# Patient Record
Sex: Male | Born: 1935 | Race: White | Hispanic: No | Marital: Married | Smoking: Former smoker
Health system: Southern US, Community
[De-identification: ages and names within clinical notes are randomized; demographics above are authoritative.]

## PROBLEM LIST (undated history)

## (undated) DIAGNOSIS — R079 Chest pain, unspecified: Secondary | ICD-10-CM

## (undated) DIAGNOSIS — G4733 Obstructive sleep apnea (adult) (pediatric): Secondary | ICD-10-CM

## (undated) DIAGNOSIS — R0602 Shortness of breath: Secondary | ICD-10-CM

## (undated) DIAGNOSIS — I739 Peripheral vascular disease, unspecified: Secondary | ICD-10-CM

## (undated) DIAGNOSIS — R0989 Other specified symptoms and signs involving the circulatory and respiratory systems: Secondary | ICD-10-CM

## (undated) DIAGNOSIS — D649 Anemia, unspecified: Secondary | ICD-10-CM

## (undated) DIAGNOSIS — I2510 Atherosclerotic heart disease of native coronary artery without angina pectoris: Secondary | ICD-10-CM

## (undated) DIAGNOSIS — K219 Gastro-esophageal reflux disease without esophagitis: Secondary | ICD-10-CM

## (undated) DIAGNOSIS — D696 Thrombocytopenia, unspecified: Secondary | ICD-10-CM

## (undated) DIAGNOSIS — Z87442 Personal history of urinary calculi: Secondary | ICD-10-CM

## (undated) DIAGNOSIS — I10 Essential (primary) hypertension: Secondary | ICD-10-CM

## (undated) DIAGNOSIS — I219 Acute myocardial infarction, unspecified: Secondary | ICD-10-CM

## (undated) DIAGNOSIS — E785 Hyperlipidemia, unspecified: Secondary | ICD-10-CM

## (undated) DIAGNOSIS — I350 Nonrheumatic aortic (valve) stenosis: Secondary | ICD-10-CM

## (undated) DIAGNOSIS — H9190 Unspecified hearing loss, unspecified ear: Secondary | ICD-10-CM

## (undated) DIAGNOSIS — F1010 Alcohol abuse, uncomplicated: Secondary | ICD-10-CM

## (undated) DIAGNOSIS — N200 Calculus of kidney: Secondary | ICD-10-CM

## (undated) HISTORY — PX: STOMACH SURGERY: SHX791

## (undated) HISTORY — DX: Anemia, unspecified: D64.9

## (undated) HISTORY — DX: Other specified symptoms and signs involving the circulatory and respiratory systems: R09.89

## (undated) HISTORY — DX: Atherosclerotic heart disease of native coronary artery without angina pectoris: I25.10

## (undated) HISTORY — DX: Nonrheumatic aortic (valve) stenosis: I35.0

## (undated) HISTORY — DX: Thrombocytopenia, unspecified: D69.6

## (undated) HISTORY — DX: Personal history of urinary calculi: Z87.442

## (undated) HISTORY — DX: Chest pain, unspecified: R07.9

## (undated) HISTORY — DX: Calculus of kidney: N20.0

## (undated) HISTORY — PX: CYSTO: SHX6284

## (undated) HISTORY — DX: Alcohol abuse, uncomplicated: F10.10

## (undated) HISTORY — DX: Peripheral vascular disease, unspecified: I73.9

## (undated) HISTORY — DX: Essential (primary) hypertension: I10

## (undated) HISTORY — DX: Hyperlipidemia, unspecified: E78.5

## (undated) HISTORY — DX: Obstructive sleep apnea (adult) (pediatric): G47.33

---

## 1989-12-13 HISTORY — PX: CORONARY ARTERY BYPASS GRAFT: SHX141

## 1994-12-13 DIAGNOSIS — I219 Acute myocardial infarction, unspecified: Secondary | ICD-10-CM

## 1994-12-13 HISTORY — DX: Acute myocardial infarction, unspecified: I21.9

## 1998-08-25 ENCOUNTER — Ambulatory Visit (HOSPITAL_COMMUNITY): Admission: RE | Admit: 1998-08-25 | Discharge: 1998-08-25 | Payer: Self-pay

## 2003-06-28 ENCOUNTER — Inpatient Hospital Stay (HOSPITAL_COMMUNITY): Admission: AD | Admit: 2003-06-28 | Discharge: 2003-07-09 | Payer: Self-pay | Admitting: Internal Medicine

## 2003-06-28 ENCOUNTER — Encounter: Payer: Self-pay | Admitting: Internal Medicine

## 2003-07-05 ENCOUNTER — Encounter: Payer: Self-pay | Admitting: Cardiothoracic Surgery

## 2003-07-06 ENCOUNTER — Encounter: Payer: Self-pay | Admitting: Cardiothoracic Surgery

## 2003-07-07 ENCOUNTER — Encounter: Payer: Self-pay | Admitting: Cardiothoracic Surgery

## 2003-07-08 ENCOUNTER — Encounter: Payer: Self-pay | Admitting: Cardiothoracic Surgery

## 2005-01-28 ENCOUNTER — Ambulatory Visit: Payer: Self-pay | Admitting: Cardiology

## 2005-05-12 ENCOUNTER — Ambulatory Visit: Payer: Self-pay | Admitting: Cardiology

## 2005-07-26 ENCOUNTER — Ambulatory Visit: Payer: Self-pay | Admitting: Cardiology

## 2005-11-08 ENCOUNTER — Ambulatory Visit: Payer: Self-pay | Admitting: Cardiology

## 2006-01-07 ENCOUNTER — Ambulatory Visit: Payer: Self-pay | Admitting: Cardiology

## 2006-10-12 ENCOUNTER — Ambulatory Visit: Payer: Self-pay | Admitting: Cardiology

## 2007-09-20 ENCOUNTER — Ambulatory Visit: Payer: Self-pay | Admitting: Internal Medicine

## 2007-10-03 ENCOUNTER — Encounter: Payer: Self-pay | Admitting: Internal Medicine

## 2007-10-03 ENCOUNTER — Ambulatory Visit: Payer: Self-pay | Admitting: Internal Medicine

## 2007-10-03 ENCOUNTER — Ambulatory Visit: Payer: Self-pay

## 2007-10-03 LAB — CONVERTED CEMR LAB
ALT: 21 units/L (ref 0–53)
AST: 20 units/L (ref 0–37)
Bilirubin, Direct: 0.2 mg/dL (ref 0.0–0.3)
CO2: 32 meq/L (ref 19–32)
Calcium: 9.4 mg/dL (ref 8.4–10.5)
Chloride: 101 meq/L (ref 96–112)
Cholesterol: 133 mg/dL (ref 0–200)
GFR calc non Af Amer: 79 mL/min
Glucose, Bld: 113 mg/dL — ABNORMAL HIGH (ref 70–99)
LDL Cholesterol: 71 mg/dL (ref 0–99)

## 2007-10-06 ENCOUNTER — Ambulatory Visit: Payer: Self-pay | Admitting: Pulmonary Disease

## 2007-10-16 ENCOUNTER — Ambulatory Visit: Payer: Self-pay | Admitting: Pulmonary Disease

## 2007-10-16 ENCOUNTER — Ambulatory Visit (HOSPITAL_BASED_OUTPATIENT_CLINIC_OR_DEPARTMENT_OTHER): Admission: RE | Admit: 2007-10-16 | Discharge: 2007-10-16 | Payer: Self-pay | Admitting: Pulmonary Disease

## 2007-11-02 ENCOUNTER — Ambulatory Visit: Payer: Self-pay | Admitting: Pulmonary Disease

## 2007-11-16 ENCOUNTER — Ambulatory Visit: Payer: Self-pay | Admitting: Pulmonary Disease

## 2007-11-16 ENCOUNTER — Ambulatory Visit (HOSPITAL_BASED_OUTPATIENT_CLINIC_OR_DEPARTMENT_OTHER): Admission: RE | Admit: 2007-11-16 | Discharge: 2007-11-16 | Payer: Self-pay | Admitting: Pulmonary Disease

## 2008-02-14 ENCOUNTER — Encounter: Payer: Self-pay | Admitting: Pulmonary Disease

## 2008-02-14 ENCOUNTER — Ambulatory Visit: Payer: Self-pay

## 2008-03-04 DIAGNOSIS — I2510 Atherosclerotic heart disease of native coronary artery without angina pectoris: Secondary | ICD-10-CM

## 2008-03-04 DIAGNOSIS — I10 Essential (primary) hypertension: Secondary | ICD-10-CM

## 2008-03-04 DIAGNOSIS — E785 Hyperlipidemia, unspecified: Secondary | ICD-10-CM | POA: Insufficient documentation

## 2008-03-05 ENCOUNTER — Ambulatory Visit: Payer: Self-pay | Admitting: Pulmonary Disease

## 2008-03-05 DIAGNOSIS — G4733 Obstructive sleep apnea (adult) (pediatric): Secondary | ICD-10-CM

## 2008-03-11 ENCOUNTER — Telehealth (INDEPENDENT_AMBULATORY_CARE_PROVIDER_SITE_OTHER): Payer: Self-pay | Admitting: *Deleted

## 2008-03-28 ENCOUNTER — Ambulatory Visit: Payer: Self-pay | Admitting: Internal Medicine

## 2008-07-09 ENCOUNTER — Encounter: Payer: Self-pay | Admitting: Pulmonary Disease

## 2008-07-18 ENCOUNTER — Ambulatory Visit: Payer: Self-pay

## 2008-07-23 ENCOUNTER — Ambulatory Visit: Payer: Self-pay | Admitting: Internal Medicine

## 2008-07-23 LAB — CONVERTED CEMR LAB
Alkaline Phosphatase: 55 units/L (ref 39–117)
Bilirubin, Direct: 0.2 mg/dL (ref 0.0–0.3)
GFR calc Af Amer: 70 mL/min
GFR calc non Af Amer: 58 mL/min
Glucose, Bld: 117 mg/dL — ABNORMAL HIGH (ref 70–99)
LDL Cholesterol: 92 mg/dL (ref 0–99)
Potassium: 3.9 meq/L (ref 3.5–5.1)
Sodium: 137 meq/L (ref 135–145)
Total Bilirubin: 1.7 mg/dL — ABNORMAL HIGH (ref 0.3–1.2)
Total CHOL/HDL Ratio: 4.3
VLDL: 30 mg/dL (ref 0–40)

## 2008-10-16 ENCOUNTER — Ambulatory Visit: Payer: Self-pay | Admitting: Internal Medicine

## 2008-10-16 LAB — CONVERTED CEMR LAB
ALT: 18 units/L (ref 0–53)
AST: 21 units/L (ref 0–37)
Albumin: 4.2 g/dL (ref 3.5–5.2)
BUN: 19 mg/dL (ref 6–23)
CO2: 29 meq/L (ref 19–32)
Calcium: 9.3 mg/dL (ref 8.4–10.5)
Chloride: 104 meq/L (ref 96–112)
Cholesterol: 152 mg/dL (ref 0–200)
Creatinine, Ser: 1.1 mg/dL (ref 0.4–1.5)
HDL: 37.3 mg/dL — ABNORMAL LOW (ref >39.0)
LDL Cholesterol: 88 mg/dL (ref 0–99)
Total Protein: 7.1 g/dL (ref 6.0–8.3)
Triglycerides: 132 mg/dL (ref 0–149)

## 2009-03-25 ENCOUNTER — Telehealth: Payer: Self-pay | Admitting: Internal Medicine

## 2009-05-01 ENCOUNTER — Ambulatory Visit: Payer: Self-pay | Admitting: Cardiology

## 2009-05-01 ENCOUNTER — Encounter: Payer: Self-pay | Admitting: Cardiology

## 2009-05-01 DIAGNOSIS — I359 Nonrheumatic aortic valve disorder, unspecified: Secondary | ICD-10-CM

## 2009-05-02 ENCOUNTER — Ambulatory Visit: Payer: Self-pay | Admitting: Family Medicine

## 2009-05-14 ENCOUNTER — Telehealth (INDEPENDENT_AMBULATORY_CARE_PROVIDER_SITE_OTHER): Payer: Self-pay | Admitting: *Deleted

## 2009-05-15 ENCOUNTER — Encounter: Payer: Self-pay | Admitting: Internal Medicine

## 2009-05-15 ENCOUNTER — Ambulatory Visit: Payer: Self-pay

## 2009-05-15 ENCOUNTER — Encounter: Payer: Self-pay | Admitting: Cardiology

## 2009-05-16 ENCOUNTER — Encounter: Payer: Self-pay | Admitting: Internal Medicine

## 2009-05-16 ENCOUNTER — Ambulatory Visit: Payer: Self-pay | Admitting: Internal Medicine

## 2009-05-19 ENCOUNTER — Encounter: Payer: Self-pay | Admitting: Internal Medicine

## 2009-05-19 ENCOUNTER — Ambulatory Visit: Payer: Self-pay | Admitting: Internal Medicine

## 2009-05-21 ENCOUNTER — Encounter: Payer: Self-pay | Admitting: Internal Medicine

## 2009-05-21 LAB — CONVERTED CEMR LAB
ALT: 12 units/L (ref 0–53)
AST: 13 units/L (ref 0–37)
Alkaline Phosphatase: 46 units/L (ref 39–117)
LDL Cholesterol: 75 mg/dL (ref 0–99)
Potassium: 3.9 meq/L (ref 3.5–5.3)
Sodium: 138 meq/L (ref 135–145)
Total Bilirubin: 1 mg/dL (ref 0.3–1.2)
Total Protein: 6.9 g/dL (ref 6.0–8.3)
Triglycerides: 92 mg/dL (ref <150)
VLDL: 18 mg/dL (ref 0–40)

## 2009-11-12 ENCOUNTER — Telehealth: Payer: Self-pay | Admitting: Internal Medicine

## 2009-11-19 ENCOUNTER — Ambulatory Visit: Payer: Self-pay | Admitting: Internal Medicine

## 2010-03-12 ENCOUNTER — Ambulatory Visit (HOSPITAL_COMMUNITY): Admission: RE | Admit: 2010-03-12 | Discharge: 2010-03-12 | Payer: Self-pay | Admitting: Family Medicine

## 2010-03-27 ENCOUNTER — Ambulatory Visit (HOSPITAL_COMMUNITY): Admission: RE | Admit: 2010-03-27 | Discharge: 2010-03-27 | Payer: Self-pay | Admitting: Family Medicine

## 2010-08-14 ENCOUNTER — Ambulatory Visit: Payer: Self-pay | Admitting: Internal Medicine

## 2011-01-11 ENCOUNTER — Telehealth: Payer: Self-pay | Admitting: Internal Medicine

## 2011-01-12 NOTE — Assessment & Plan Note (Signed)
Summary: f94m   Visit Type:  Follow-up Referring Euline Kimbler:  Susanne Borders Primary Nnamdi Dacus:  [facility] - Dr. Shelva Majestic  CC:  Just had gallbladder removed in July and is doing well. Marland Kitchen  History of Present Illness: Mr. Lazo is a 75 yo with h/o CAD s/p CABG 1996 with redo 2004. Also has h/o aortic stenosis, HTN and hyperlipidemia.  Was seen by Dr. Shirlee Latch for an unscheduled visit in June 2011 after experiencing chest pain and abdominal pain with diarrhea. He was referred for echo and stress test. Echo normal EF with very mild AS. Stress test no ischemia. Symptoms resolved with antacid. Also found to have gallstones and underwent cholecystectomy in July 2011.  Here for routine f/u.  Doing very well. Remains active around his property. No CP or SOB. Taking all meds without problem. No syncope or presyncope.   Dr. Shelva Majestic is following lipids.  Current Medications (verified): 1)  Adprin B 325 Mg  Tabs (Aspirin Buf(Cacarb-Mgcarb-Mgo)) .... Once Daily 2)  Vytorin 10-80 Mg  Tabs (Ezetimibe-Simvastatin) .... Once Daily 3)  Lisinopril 20 Mg  Tabs (Lisinopril) .... Once Daily 4)  Imdur 30 Mg  Tb24 (Isosorbide Mononitrate) .... One Tablet Once Daily 5)  Hydrochlorothiazide 25 Mg Tabs (Hydrochlorothiazide) .Marland Kitchen.. 1 By Mouth Once Daily 6)  Metoprolol Tartrate 50 Mg Tabs (Metoprolol Tartrate) .... 1/2 By Mouth Two Times A Day 7)  Nitroglycerin 0.4 Mg Subl (Nitroglycerin) .Marland Kitchen.. 1 Tablet Under Tongue As Directed 8)  Fish Oil Concentrate 1000 Mg Caps (Omega-3 Fatty Acids) .Marland Kitchen.. 1 Capsules Daily 9)  Tylenol Extra Strength 500 Mg Tabs (Acetaminophen) .Marland Kitchen.. 1-2 Tab As Needed For Pain 10)  Nexium 40 Mg Cpdr (Esomeprazole Magnesium) .... One Tablet Every Two Days  Allergies (verified): No Known Drug Allergies  Past History:  Past Medical History: Last updated: 11/19/2009 1.  Coronary Heart Disease:       -- MI 1996 with CABG 1996.       -- Redo CABG 06/2003.    --Myoview 7/09 EF 54%, inferobasal infarct, no ischemia.       --Myoview. 6/10 EF 43% inferior wall infarct. no ischemia.      -- Echo 6/10: EF 55% mild AS mean 7 2.  Hyperlipidemia 3.  Hypertension 4.  Nephrolithiasis 5.  Aortic Stenosis, mild 6.  Carotid Bruits:  2009 0-39% on dopplers bilaterally. 7.  OSA  Family History: Last updated: 03/04/2008 heart disease-mother and father  Social History: Last updated: 05/01/2009 married and lives with wife in [place].   retired from [employer] quit smoking in early 1990s  Risk Factors: Smoking Status: quit (03/04/2008)  Past Surgical History: CABG redo 2004 CABG  1991 @ [facility]   gallbladder removed 2011  Review of Systems       As per HPI and past medical history; otherwise all systems negative.   Vital Signs:  Patient profile:   75 year old male Height:      69 inches Weight:      197 pounds BMI:     29.20 Pulse rate:   59 / minute BP sitting:   140 / 70  (left arm) Cuff size:   regular  Vitals Entered By: Bishop Dublin, CMA (August 14, 2010 10:11 AM)  Physical Exam  General:  Well developed, well nourished, in no acute distress. Ears:  Hard of hearing Neck:  Neck supple, no JVD. soft bilat bruits Lungs:  Clear bilaterally to auscultation and percussion. Heart:  Non-displaced PMI,  chest non-tender; regular rate and rhythm, S1, S2 2/6 mid-peaking crescendo-decrescendo murmur RUSB, no radiation to neck, S2 heard clearly, no gallop.  Pedals normal pulses. No edema, no varicosities. Abdomen:  Bowel sounds positive; abdomen soft and non-tender without masses, organomegaly, or hernias noted. No hepatosplenomegaly. Extremities:  No clubbing or cyanosis.edema Neurologic:  Alert and oriented x 3.  Moves all 4 without difficulty. Affect pleasant Psych:  Normal affect.     Impression & Recommendations:  Problem # 1:  CORONARY HEART DISEASE (ICD-414.00) Stable. No evidence of ischemia. Continue current  regimen.  Problem # 2:  AORTIC STENOSIS (ICD-424.1)  Mild. Asmptomatic. Get f/u echo 2 years.  Future Orders: Echocardiogram (Echo) ... 02/12/2011  Problem # 3:  HYPERTENSION (ICD-401.9) Mildly elevated. Followed by Dr. Shelva Majestic.   Problem # 4:  HYPERLIPIDEMIA (ICD-272.4) On Vytorin. Followed by Dr. Shelva Majestic. Goal LDL < 70.   Other Orders: EKG w/ Interpretation (93000)

## 2011-01-20 NOTE — Progress Notes (Signed)
Summary: RX  Phone Note Refill Request Call back at Home Phone [phone] Message from:  Patient on January 11, 2011 12:32 PM  Refills Requested: Medication #1:  ISOSORBIDE DINITRATE 10 MG TABS Take one capsule by mouth three times a day   Notes: SHOULD BE 3 TIMES A DAY [facility] IN [place]- [phone] wants to know if this can go back to 1 pill a day?  Please check to see if the pt needs any other refills.  Initial call taken by: Harlon Flor,  January 11, 2011 12:34 PM  Follow-up for Phone Call        notified patient needs to have the Imdur 30 mg. The pharmacy only had Imdur 10 mg three times a day. The patient understands, he will take the way it is prescribed Imdur 10 mg three times a day.  The patient was also instructed to check his medications and to call the pharmacy for any refills and they will contact our office. Follow-up by: Bishop Dublin, CMA,  January 11, 2011 3:52 PM

## 2011-03-02 ENCOUNTER — Other Ambulatory Visit: Payer: Self-pay | Admitting: Internal Medicine

## 2011-03-02 DIAGNOSIS — E785 Hyperlipidemia, unspecified: Secondary | ICD-10-CM

## 2011-03-08 NOTE — Telephone Encounter (Signed)
Pt wife calling re meds. Pt does not have anymore heart meds.

## 2011-03-09 NOTE — Telephone Encounter (Signed)
Refill was not delayed spoke with pharmacist..Received on March 26,2012 Matthew [name]

## 2011-04-27 NOTE — Procedures (Signed)
NAME:  Matthew [name], Matthew [name]                ACCOUNT NO.:  [id number]   MEDICAL RECORD NO.:  [id number]          PATIENT TYPE:  OUT   LOCATION:  SLEEP CENTER                 FACILITY:  [facility]   PHYSICIAN:  Coralyn Helling, MD        DATE OF BIRTH:  03-20-1936   DATE OF STUDY:  10/16/2007                            NOCTURNAL POLYSOMNOGRAM   REFERRING PHYSICIAN:  Coralyn Helling, MD   INDICATION:  [name] has a history of coronary artery disease, sleep  disruption, and excessive daytime sleepiness.  He is referred to the  sleep lab for evaluation of hypersomnia with obstructive sleep apnea.   EPWORTH SCORE:  13   MEDICATIONS:  1. Vytorin.  2. Lisinopril.  3. Hydrochlorothiazide.  4. Isosorbide.  5. Metoprolol.  6. Aspirin.  7. Plavix.   SLEEP ARCHITECTURE:  The patient followed a split night studyprotocol.  Total during the diagnostic portion of the test, total recording time  was 176 minutes.  Total sleep time was 128 minutes.  Sleep efficiency  was 73%.  The study was also notable for the lack of slow-wave sleep.  The patient had very minimal amount of supine sleep.   RESPIRATORY DATA:  The average respiratory rate was 16.  The overall  apnea-hypopnea index was 22.  There were 31 obstructive events, 13  central events, and 2 mixed events.  Moderate snoring was noted by the  technician. During the therapeutic portion of the test, the patient was  titrated from a CPAP pressure setting of 4 to 9 cm of water.  However,  it appeared that he developed periodic breathing while wearing CPAP  which does not resolve with increase in CPAP pressures.   OXYGEN DATA:  The baseline oxygenation was 93%.  The oxygen saturation  nadir was 85%.  The patient spent a total of 0.4 minutes with an oxygen  saturation less than 88% and 2.7 minutes with an oxygen saturation less  than 90%.   CARDIAC DATA:  The average heart rate was 51 and rhythm strip shows  normal sinus rhythm with occasional PVCs.   MOVEMENT-PARASOMNIA:  The periodic limb movement index was 2.  The  patient had 1 rest room trip.   IMPRESSION:  This study shows evidence for moderate obstructive sleep  apnea as demonstrated by apnea-hypopnea index of 22 and oxygen  saturation nadir of 85%.  During the therapeutic portion of this split  night study, the patient was tried on CPAP; however, he developed  periodic breathing pattern while wearing CPAP.  What I would recommend  is that the patient return to the sleep lab for a repeat titration study  either using BiPAP or adaptive  Servo ventilator.      Coralyn Helling, MD  Diplomat, American Board of Sleep Medicine  Electronically Signed     VS/MEDQ  D:  10/31/2007 14:08:20  T:  11/01/2007 09:24:03  Job:  [id number]
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
## 2011-04-27 NOTE — Procedures (Signed)
Matthew [name], Matthew [name]                ACCOUNT NO.:  [id number]   MEDICAL RECORD NO.:  [id number]          PATIENT TYPE:  OUT   LOCATION:  SLEEP CENTER                 FACILITY[facility]  [facility]   PHYSICIAN:  Coralyn Helling, MD        DATE OF BIRTH:  [birth year]   DATE OF STUDY:  11/16/2007                            NOCTURNAL POLYSOMNOGRAM   REFERRING PHYSICIAN:  Coralyn Helling, MD   INDICATION FOR STUDY:  This is an individual who had an overnight  polysomnogram done on October 16, 2007.  He was noted to have moderate  obstructive sleep apnea with an apnea/hypopnea index of 22 and oxygen  saturation nadir of 85%.  During that test, he was noted to have both  obstructive sleep apnea, central sleep apnea, and periodic breathing  consistent with a diagnosis of complex sleep apnea.  He returns to the  sleep lab for a titration study using adaptive servo-ventilator.  Height  is 5 feet 9 inches tall, weight is 193 pounds, BMI is 28.5.  Neck size  is 15.5 inches.   EPWORTH SLEEPINESS SCORE:  9   MEDICATIONS:  Lisinopril, __________  , metoprolol, Vytorin, Plavix, and  aspirin.   SLEEP ARCHITECTURE:  Total sleep period was 407 minutes.  Total sleep  time was 252 minutes.  Sleep efficiency is 57% which is reduced.  Sleep  latency is 31 minutes.  REM latency is 202 minutes which is slightly  prolonged.  The study was notable for lack of slow wave sleep.  The  patient slept in both the supine and non-supine positions.   RESPIRATORY DATA:  The patient was initially started on adaptive servo-  ventilator with the EPP of 5, with a minimal pressure setting of 3 and a  maximum pressure setting of 15, with a respiratory rate of 16.  He was  eventually titrated to an EPP of 6 with a minimal pressure support of 4,  and a maximum pressure support of 15 with a respiratory rate of 16.  At  those settings his apnea/hypopnea index was 0.5.  He was observed in  both REM sleep and supine sleep with these pressure settings.   Of note  is that there was no evidence for periodic breathing or central apneic  events.   OXYGEN DATA:  The baseline oxygenation was 93%.  The oxygen saturation  nadir was 88%.  The patient spent a total of 6 minutes with an oxygen  saturation below 90% and 0.4 minutes with an oxygen saturation below  88%.   CARDIAC DATA:  The rhythm strip showed a normal sinus rhythm with PVCs  and the average heart rate was 66.   MOVEMENT-PARASOMNIA:  The periodic limb movement index was zero and the  patient had 2 restroom trips.   IMPRESSIONS-RECOMMENDATIONS:  This was a titration study using adaptive  servo-ventilator at a EPP of 6 with a minimal pressure support of 4 and  maximum pressure support of 15 and a respiratory rate of 16.  The  overall apnea/hypopnea index was reduced to 0.5.  With these pressure  settings the patient was observed in REM sleep and  supine  sleep and snoring was eliminated.  In addition, there was no  evidence for either central apneic events or periodic breathing and the  patient appeared to tolerate the use of this quite well.      Coralyn Helling, MD  Diplomat, American Board of Sleep Medicine  Electronically Signed     VS/MEDQ  D:  11/24/2007 09:42:44  T:  11/24/2007 16:43:56  Job:  [id number]

## 2011-04-27 NOTE — Assessment & Plan Note (Signed)
[facility]                             PULMONARY OFFICE NOTE   KASSON, LAMERE                       MRN:          [id number]  DATE:10/06/2007                            DOB:          1936-07-24    I had the pleasure of meeting Mr. Hannold today for evaluation of sleep  difficulties.   He says he has had this problem for several years.  He has been having  problems feeling tired during the day, and falls asleep very easily at  times while watching TV.  He goes to bed at around 9 o'clock at night.  He usually falls asleep apparently quickly; however, he wakes up 2-3  times during the night, sometimes with a choking sensation as well as to  have to go to the bathroom.  He also wakes up sometimes with vivid  dreams, although he denies acting out his dreams.  He has this pattern  recur through the night until he wakes up at about 6:30 in the morning.  He says when he wakes up he still feels quite tired, but he denies  having headaches.  He does snore and he used to grind his teeth while he  was asleep, although now he has lost most of his teeth.  He has no  history of sleep walking.  He denies any symptoms of restless leg  syndrome.  There is no history of sleep hallucinations, sleep paralysis  or cataplexy.  He has tried using Tylenol PM to help with his sleep and  says this helps sometimes.  He is not currently using anything to help  him stay awake during the day.   PAST MEDICAL HISTORY:  1. Coronary artery disease, status post myocardial infarction, status      post coronary artery bypass grafting.  2. Elevated cholesterol.  3. Hypertension.  4. Nephrolithiasis.   MEDICATIONS:  1. Aspirin 325 mg daily.  2. Imdur 30 mg daily.  3. Lopressor 25 mg b.i.d.  4. Hydrochlorothiazide 25 mg daily.  5. Vytorin 10/80 mg daily.  6. Lisinopril 20 mg daily.  7. Plavix 75 mg daily.   He has no known drug allergies.   SOCIAL HISTORY:  He is married.   He is retired from the [employer].  He quit smoking in 1991.  He used to smoke a third of a pack  of cigarettes a day for about 30 to 40 years.  He quit drinking alcohol  in 1981.   FAMILY HISTORY:  Significant for his parents who have heart disease.   REVIEW OF SYSTEMS:  He says his weight is reasonably stable.  He is not  having any problems with nasal congestion or postnasal drip.  He denies  any symptoms of coughing, wheezing or sputum production.   PHYSICAL EXAMINATION:  He is 5 feet 9 inches tall, 193 pounds,  temperature is 98.2, blood pressure is 134/70, heart rate is 67, oxygen  saturation is 96% on room air.  HEENT:  Pupils reactive.  There is no sinus tenderness, no oral lesions.  He has a Mallampati  III airway.  There is no lymphadenopathy, no  thyromegaly.  HEART:  S1 and S2.  CHEST:  There was no wheezing or rales.  ABDOMEN:  Soft, nontender, positive bowel sounds.  EXTREMITIES:  There was no edema, cyanosis, or clubbing.  NEUROLOGICAL EXAM:  No focal deficits were appreciated.   IMPRESSION:  He does have symptoms as well as physical findings which  brings a possibility of sleep disordered breathing.  This of particular  concern given his cardiovascular history as well as his history of  hypertension.  To further evaluate this, I will arrange for him to  undergo an overnight polysomnogram.  In the meantime, I have discussed  with him the importance of diet, exercise and weight reduction as well  as the avoidance of alcohol and sedatives.  Driving precautions were  reviewed with him as well.  I will then plan on following up with him  after I have a chance to review his sleep study.     Coralyn Helling, MD  Electronically Signed    VS/MedQ  DD: 10/06/2007  DT: 10/07/2007  Job #: [id number]   cc:   Bevelyn Buckles. Bensimhon, MD

## 2011-04-27 NOTE — Assessment & Plan Note (Signed)
[facility]                            CARDIOLOGY OFFICE NOTE   Matthew [name], Matthew [name]                       MRN:          [id number]  DATE:03/28/2008                            DOB:          12-03-1936    PRIMARY CARE PHYSICIAN:  Dr. Ezzard [name]   HISTORY:  Matthew [name] is a very pleasant 75 year old male with history of  coronary artery disease status post previous myocardial infarction in  1996.  He had redo bypass grafting in July 2004.  Has a history of  hypertension, hyperlipidemia.  The last visit we sent him for a sleep  study.  He is found to have moderate sleep apnea.  He also underwent  echocardiogram showed ejection fraction of 50-55% with mild aortic  stenosis and mild mitral regurgitation.  A mean gradient through the  aortic valve was 11.   He returns today for routine follow-up.  He is doing great.  No chest  pain or shortness of breath.  Denies any orthopnea or PND.  No lower  extremity.  He remains fairly active.  He did have an accident with a  power saw where he almost cut off two fingers on his left hand.   CURRENT MEDICATIONS:  1. Imdur 30 mg a day.  2. Lopressor 25 b.i.d.  3. HCTZ 25 a day.  4. Vytorin 10/80.  5. Lisinopril 20 a day.  6. Plavix 75 a day.  7. Aspirin 325 a day.   PHYSICAL EXAMINATION:  GENERAL:  He is well-appearing, ambulates around  the clinic without any respiratory difficulty.  Blood pressure is  130/74.  He did not take his Lopressor today.  Heart rate 66, weights  194.  HEENT is normal.  NECK:  Supple.  There is no JVD.  Carotid 2+ bilaterally with bilateral  soft bruits, no lymphadenopathy or thyromegaly.  CARDIAC:  PMI is nondisplaced.  There is regular rate and rhythm with  2/6 systolic ejection murmur at the right sternal border.  S2 is well  preserved.  No gallop.  LUNGS:  Clear with decreased breath sounds throughout.  No wheezes or  rales.  ABDOMEN:  Soft, nontender, nondistended.  No  hepatosplenomegaly, no  bruits, no masses.  EXTREMITIES:  Warm with no sinus clubbing or edema.  NEUROLOGICAL:  Alert and oriented x3.  Cranial nerves II-XII intact  except for hearing loss.  Moves all fours difficulty.  Affect is  pleasant.   STUDIES:  EKG shows normal sinus rhythm at a rate of 66, inferior Q  waves and mild T-wave flattening in V6, otherwise no significant ST-T  wave abnormalities.   ASSESSMENT/PLAN:  1. Coronary artery disease is stable.  No evidence of ischemia.      Continue current therapy.  2. Hypertension.  This is followed Dr. Lorin [name].  Blood pressure is      mildly elevated today, but he did not take his Lopressor.  If his      blood pressure remains over 130, would consider increasing his      lisinopril.  3. Lipids.  Total cholesterol is 133, triglycerides  140, HDL 35 and      LDL 71.  He is just about at goal for his LDL.  We did discuss the      possibility of adding Niaspan for his HDL, but he is concerned      about medication costs and prefers not to do it.  4. Aortic stenosis is mild, he will need follow-up on every few years.  5. Carotid bruits.  Carotid ultrasound looks good 0-39% bilaterally.   DISPOSITION:  Overall, he is doing quite well.  I will see him back in 9  months for routine follow-up.     Matthew Buckles. Bensimhon, MD  Electronically Signed    DRB/MedQ  DD: 03/28/2008  DT: 03/28/2008  Job #: [id number]   cc:   Matthew [name]

## 2011-04-27 NOTE — Assessment & Plan Note (Signed)
[facility]                             PULMONARY OFFICE NOTE   RUSH, SALCE                       MRN:          [id number]  DATE:11/02/2007                            DOB:          [birth year]    I saw [name] in followup today for his sleep apnea.   He had undergone an overnight polysomnogram on October 16, 2007.  He  followed a split-night study protocol.  During the diagnostic portion of  the test, he was found to have moderate obstructive sleep apnea with an  apnea-hypopnea index of 22 with an oxygen saturation nadir of 85%.  Of  note was that he had both central and obstructive events.  During the  therapeutic portion of the test, he was started on CPAP and titrated up  to a pressure of 9; however, he continued to have problems with periodic  breathing.   I have reviewed the results of the sleep test with [name].  I have  again reviewed the adverse consequences of untreated sleep apnea.  I  have also discussed with him the importance of having essential sleep  apnea in relation to his underlying heart disease.  I have reviewed  various treatment options for his sleep apnea.  What I have suggested is  that he have a repeat sleep study with a full night titration study  using an adaptive cerebral ventilator to hopefully be able to correct  both his central and obstructive sleep disordered breathing.  I then  plan on following up with him after I review his second sleep study.     Coralyn Helling, MD  Electronically Signed    VS/MedQ  DD: 11/02/2007  DT: 11/02/2007  Job #: [id number]   cc:   Bevelyn Buckles. Bensimhon, MD

## 2011-04-27 NOTE — Assessment & Plan Note (Signed)
[facility]                            CARDIOLOGY OFFICE NOTE   MILFORD, CILENTO                       MRN:          [id number]  DATE:07/23/2008                            DOB:          Apr 30, 1936    PRIMARY CARE PHYSICIAN:  Ezzard Standing.   INTERVAL HISTORY:  Mr. Detloff is a 75 year old male with history of  coronary artery disease, status post previous myocardial infarction in  1996.  He has had a redo bypass grafting in 2004.  He also has mild  aortic stenosis with a mean gradient of 11.  Remainder of medical  history is notable for hypertension, hyperlipidemia, and moderate sleep  apnea.  Ejection fraction has been on the 50-55% range.   He returns today for followup.  He is here with his wife.  Overall, he  has been doing fairly well.  However, 2 weeks ago, he woke up with  severe chest pain.  He says it was like a train sitting on his chest.  This resolved soon after.  He called the office and was set up for a  stress test.  He underwent adenosine Myoview, which showed an EF of 54%  with a previous inferior basilar infarct.  There was no ischemia.   Since that time, he has not had any recurrent chest pain.  He has been  quite active mowing the lawn and all other activities, but no recurrent  chest pain or shortness of breath.   REVIEW OF SYSTEMS:  Otherwise, he is doing well.  He is little bit of  hard of hearing.  No fevers, chills, nausea, vomiting, or bleeding.  He  denied any syncope or palpitations.  Remainder review of systems is  negative, except for HPI and problem list.   CURRENT MEDICATIONS:  1. Imdur 30.  2. Lopressor 25 b.i.d.  3. HCTZ 25 a day.  4. Vytorin 10/80.  5. Lisinopril 20.  6. Plavix 75.  7. Aspirin 325.   PHYSICAL EXAMINATION:  GENERAL:  He is a well appearing in no acute  distress.  Ambulates around the clinic without respiratory difficulty.  VITAL SIGNS:  Blood pressure is 118/70, heart rate 65, and weight  191.  HEENT:  Normal.  NECK:  Supple.  There is no JVD.  Carotids are 2+ bilaterally with  bilateral radiated bruits.  There is no lymphadenopathy or thyromegaly.  CARDIAC:  He has regular rate and rhythm with 2/6 systolic ejection  murmur at the right sternal border.  S2 is well preserved.  LUNGS:  Clear.  ABDOMEN:  Soft, nontender, and nondistended.  No hepatosplenomegaly.  No  bruits.  No masses.  Good bowel sounds.  EXTREMITIES:  Warm with no  cyanosis, clubbing, or edema.  No rash.  NEURO:  He is alert and oriented x3.  Cranial nerves III-XII are intact,  except for his hearing.  Moves all 4 extremities without difficulty.  Affect is pleasant.   EKG shows sinus rhythm at a rate of 60 with small inferior lateral Q  waves.  Question significance.   ASSESSMENT AND PLAN:  1.  Coronary artery disease.  He did have an episode recently that was      concerning for angina.  However, this was resolved and a stress      test shows no evidence of ischemia.  I made very clear to him and      his wife that if he is having recurrent pain that we would have a      low threshold proceed with cardiac catheterization.  He is to call      me for any further episodes of pain.  Should he have a severe pain,      then, he needs to call 911.  He does have sublingual nitroglycerin      and we reviewed the proper use of this.  2. Hypertension, well controlled.  3. Hyperlipidemia.  He is due for his cholesterol panel today.  We      will also check a lipid panel given his low HDL in the past.  We      will start him on fish oil.  He has been resistant to start Niaspan      previously due to the cost.   DISPOSITION:  We will see him back in the year for routine followup.  He  knows how to get in touch with me should he be having recurrent  symptoms.     Bevelyn Buckles. Bensimhon, MD  Electronically Signed    DRB/MedQ  DD: 07/23/2008  DT: 07/23/2008  Job #: [id number]

## 2011-04-30 NOTE — Assessment & Plan Note (Signed)
[name]                              CARDIOLOGY OFFICE NOTE   Matthew [name], Matthew [name]                       MRN:          [id number]  DATE:10/12/2006                            DOB:          [birth year]    PRIMARY CARE PHYSICIAN:  Ezzard Standing, MD   HISTORY OF PRESENT ILLNESS:  Matthew [name] is a 75 year old gentleman status post  re-do coronary artery bypass grafting in July 2004, after presenting with  unstable angina. He also had a myocardial infarction in 1996. Despite this,  left ventricular systolic function remains preserved. 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 He has some chest discomfort only if he exerts himself vigorously.  This is no different than previous. He has not had any PND, orthopnea,  edema, palpitations, syncope, pre-syncope, or claudication.   CURRENT MEDICATIONS:  1. Aspirin 325 mg per day.  2. Imdur 30 mg per day.  3. Lopressor 25 mg twice per day.  4. HCTZ 25 mg per day.  5. Vytorin 10/80 one per day.  6. Lisinopril 20 mg per day.   PHYSICAL EXAMINATION:  He is generally well-appearing in no distress. Heart  rate: 55. Blood pressure: 118/64. He weighs 196 pounds.  He has no jugulovenous distention, thyromegaly or lymphadenopathy.  Respiratory effort is normal.  LUNGS:  Clear to auscultation.  He has a regular rate and rhythm. The point of maximal cardiac impulse is  not displaced. Median sternotomy is well healed. There is a 2/6 systolic  ejection murmur at the right upper sternal border without radiation.  ABDOMEN: Soft, nondistended and nontender. There is no hepatosplenomegaly  and no pulsatile midline mass.  EXTREMITIES: Are warm and without edema.   ELECTROCARDIOGRAM: Demonstrates sinus bradycardia and is otherwise normal.   IMPRESSION/RECOMMENDATIONS:  1. Coronary disease: Status post re-do coronary artery bypass grafting      surgery in July 2004.  Remains asymptomatic. Continue current      medications. A subgroup analysis of the Charisma trial indicates some      benefit to adding Plavix to aspirin in patient's at high risk of      myocardial infarction. I will therefore add Plavix.  2. Hypertension: Blood pressure nicely controlled. No changes.  3. Hyperlipidemia: Excellent control as of January.     Salvadore Farber, MD  Electronically Signed    WED/MedQ  DD: 10/12/2006  DT: 10/12/2006  Job #: [id number]   cc:   Ezzard Standing

## 2011-04-30 NOTE — H&P (Signed)
NAME:  Matthew [name], Matthew [name]                          ACCOUNT NO.:  [id number]   MEDICAL RECORD NO.:  [id number]                   PATIENT TYPE:  INP   LOCATION:  4736                                 FACILITY:  [facility]   PHYSICIAN:  Pricilla Riffle, M.D.                 DATE OF BIRTH:  July 29, 1936   DATE OF ADMISSION:  06/28/2003  DATE OF DISCHARGE:                                HISTORY & PHYSICAL   CHIEF COMPLAINT:  Chest pain.   HISTORY OF PRESENT ILLNESS:  Mr. Alok is a very pleasant 75 year old  married white male with a history of coronary artery disease, status post  CABG in 1991, treated hypertension and treated hyperlipidemia, who presents  to the office today for evaluation of chest pain.  He developed severe  substernal chest tightness and dyspnea with lying down two nights ago.  He  described his pain as feeling just like it was when he had his myocardial  infarction.  He took some aspirin and got up.  He felt better in several  minutes.  Since that episode, he has had several episodes of left chest  tingling/burning off and on.  He has not taken any nitroglycerin.  He also  describes progressively worsening dyspnea with exertion over the last year.  Any time he gets out and exerts himself, he notices that his breathing is  getting worse.  He did change a tire out in the extreme heat the other day.  He thinks that this may be related to that.  He denies any syncope,  increasing edema, weight changes or paroxysmal nocturnal dyspnea.  He has  noticed some ingestion of late that he has taken Tums for.  He did develop  some dizziness with this yesterday.  He has also noted some water brash.   PAST MEDICAL HISTORY:  1. Coronary artery disease.     a. Myocardial infarction.     b. Status post CABG in 1991.     c. Catheterization in [place], [place] in 1997:  EF 40%, main LAD        patent, SVG to circumflex patent, SVG to RCA totally occluded.     d. Cardiolite in October of  2003:  EF 52%, inferior scar and trivial        inferior ischemia.  2. Hypertension.  3. Hyperlipidemia.   ALLERGIES:  No known drug allergies.   MEDICATIONS:  1. Imdur 30 mg daily.  2. Aspirin.  3. Altace 10 mg a day.  4. Hydrochlorothiazide 50 mg daily.  5. Crestor 20 mg q.h.s.  6. Tylenol 50 mg half a tablet daily.   SOCIAL HISTORY:  The patient lives with his wife in [place].  He quit  smoking cigarettes in 1991.  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   FAMILY HISTORY:  Family history is significant for coronary artery disease.  REVIEW OF SYSTEMS:  Please see HPI.  He denies any fevers, chills,  hematochezia, melena, diarrhea, constipation, dysuria or hematuria.  He has  noted bilateral lower extremity achiness with exertion that is relieved with  rest.   PHYSICAL EXAMINATION:  GENERAL:  He is a well-developed, well-nourished male  in no in acute distress.  VITAL SIGNS:  Blood pressure is 145/72.  Pulse 55.  Weight 194 pounds.  HEENT:  Normocephalic and atraumatic.  NECK:  Neck supple without lymphadenopathy, thyromegaly or bruits.  JVP is  slightly elevated bilaterally.  LYMPHADENOPATHY:  None.  CARDIOVASCULAR:   Heart regular rate and rhythm.  Normal S1 and S2.  There  is a 2/6 systolic ejection murmur.  LUNGS:  Clear to auscultation bilaterally.  SKIN:  No rashes.  ABDOMEN:  Soft, nontender with normoactive bowel sounds.  No rebound.  No  guarding.  No hepatosplenomegaly.  EXTREMITIES:  No clubbing, cyanosis, or edema.  VASCULAR:  Femoral artery and posterior tibialis pulses 1+ bilaterally.  There are no femoral artery bruits noted.  MUSCULOSKELETAL:  There is no spine or CVA tenderness.  NEUROLOGIC:  Grossly intact.   LABORATORY AND ACCESSORY CLINICAL DATA:  Electrocardiogram:  Sinus  bradycardia with a heart rate of 52 with Q waves in II, III and aVF.  There  are no changes from his previous tracing done in October of 2003.    IMPRESSION:  1. Unstable angina pectoris.  2. Coronary artery disease.     a. Status post coronary artery bypass graft in 1991.     b. Catheterization in 1997 with two-out-of-three grafts patent.  3. Hypertension.  4. Hyperlipidemia.  5. Questionable claudication.   PLAN:  The patient was also seen by Dr. Pricilla Riffle today.  We plan to  admit him directly from the office to [facility].  We will try to  add him on for catheterization later today.  Otherwise, we will plan for  catheterization on Monday.  We will place him on heparin, aspirin and his  usual home medications.  We will also put him on IV nitroglycerin.  We will  check serial enzymes as well as a CMET, TSH, fasting lipids, PT and PTT.     Tereso Newcomer, P.A.                        Pricilla Riffle, M.D.    SW/MEDQ  D:  06/28/2003  T:  06/29/2003  Job:  [id number]   cc:   Nicholes Rough, [place] [facility]    cc:   [place], [place] [facility]

## 2011-04-30 NOTE — Discharge Summary (Signed)
NAME:  Matthew [name], Matthew [name]                          ACCOUNT NO.:  [id number]   MEDICAL RECORD NO.:  [id number]                   PATIENT TYPE:  INP   LOCATION:  2012                                 FACILITY:  [facility]   PHYSICIAN:  Gwenith Daily. Tyrone Sage, M.D.            DATE OF BIRTH:  February 11, 1936   DATE OF ADMISSION:  06/28/2003  DATE OF DISCHARGE:  07/09/2003                                 DISCHARGE SUMMARY   PRIMARY ADMITTING DIAGNOSIS:  Chest pain.   ADDITIONAL DISCHARGE DIAGNOSES:  1. Coronary artery disease.  2. History of myocardial infarction.  3. Status post coronary artery bypass graft in 1991.  4. Hypertension.  5. Hyperlipidemia.   PROCEDURES PERFORMED:  1. Cardiac catheterization.  2. Redo coronary artery bypass grafting x4 (right internal mammary artery to     the diagonal, left radial artery to the circumflex, saphenous vein graft     to the first obtuse marginal, saphenous vein graft to the right coronary     artery).   HISTORY:  The patient is a 75 year old male with a history of coronary  artery disease. He is status post CABG in 1991. He has done well since that  time, however, on the date of this admission he developed substernal chest  tightness and dyspnea upon lying down. His symptoms actually began two days  prior to admission and have intermittently occurred since that time. He has  also had some dizziness as well as some dyspnea on exertion. Because of the  symptoms he presented to the emergency department for evaluation. He was  seen by cardiology and was admitted for further evaluation and treatment.   HOSPITAL COURSE:  He was admitted and started on heparin. He underwent  cardiac catheterization on June 28, 2003 and was found to have significant  coronary artery disease, which was not felt to be amenable to percutaneous  intervention. Dr. Tyrone Sage saw the patient in consultation and recommended a  redo coronary artery bypass surgery. The patient agreed to  proceed and was  taken to the operating room on July 05, 2003. He underwent a redo CABG x4  with the above noted grafts. He tolerated the procedures well and was  transferred to the SICU in stable condition. He was extubated shortly after  surgery. He was hemodynamically stable and doing well on postoperative day  one. He was started on Imdur for his radial artery graft. He remained in the  ICU for overnight observation. He was started on diuresis and by  postoperative day two he was ready for transfer to the floor. He has done  well postoperatively. He has been ambulating independently in the halls  without difficulty. He has been weaned off of supplemental oxygen and is  maintaining O2 saturations of greater than 90% on room air. His surgical  incision sites are healing well. He has been afebrile and all vital signs  have been stable. His  labs have remained stable. Creatinine at 1.0 and  hematocrit 25.7. He is tolerating a regular diet and is having normal bowel  and bladder function. It is felt that if he continues to remain stable he  will be ready for discharge home on July 08, 2003.   DISCHARGE MEDICATIONS:  1. Enteric-coated aspirin 325 mg daily.  2. Lopressor 25 mg b.i.d.  3. Folic acid 1 mg daily.  4. Altace 2.5 mg daily.  5. Imdur 30 mg daily.  6. Crestor 20 mg q.h.s.  7. Hydrochlorothiazide 50 mg daily.  8. Tylox 1-2 q.4h. p.r.n. for pain.   DISCHARGE INSTRUCTIONS:  He is to refrain from driving, heavy lifting, or  strenuous activity. He may continue daily walking and use of his incentive  spirometer. He is asked to shower daily and clean his incisions with soap  and water. He will continue a low-fat, low-sodium diet.   DISCHARGE FOLLOW UP:  He will be scheduled to see Dr. Samule Ohm or one of his  associates in the office in two weeks and have a chest x-ray at that visit.  He will then follow-up with Dr. Tyrone Sage on Thursday, August 08, 2003 at 12  o'clock p.m. He is asked  to bring his chest x-ray to this appointment for  Dr. Tyrone Sage to review.      Coral Ceo, P.A.                        Gwenith Daily Tyrone Sage, M.D.    GC/MEDQ  D:  07/08/2003  T:  07/09/2003  Job:  [id number]   cc:   [facility]   [facility]

## 2011-04-30 NOTE — Op Note (Signed)
NAME:  Matthew [name], Matthew [name]                          ACCOUNT NO.:  [id number]   MEDICAL RECORD NO.:  [id number]                   PATIENT TYPE:  INP   LOCATION:  2012                                 FACILITY:  [facility]   PHYSICIAN:  Gwenith Daily. Tyrone Sage, M.D.            DATE OF BIRTH:  Jun 28, 1936   DATE OF PROCEDURE:  07/05/2003  DATE OF DISCHARGE:                                 OPERATIVE REPORT   PREOPERATIVE DIAGNOSIS:  Recurrent coronary occlusive disease.   POSTOPERATIVE DIAGNOSIS:  Recurrent coronary occlusive disease.   PROCEDURE:  Redo coronary artery bypass grafting, with harvesting of left  radial artery and Endo-vein, with right internal mammary artery to the  diagonal coronary artery, left radial artery to the circumflex coronary  artery, reverse saphenous vein graft to the first obtuse marginal, a reverse  saphenous vein graft to the right coronary artery, and the placement of  right femoral arterial line.   SURGEON:  Gwenith Daily. Tyrone Sage, M.D.   ASSISTANT:  Rowe Clack, P.A.-C.   INDICATIONS FOR PROCEDURE:  The patient is a 75 year old male who 13 years  prior had undergone a coronary artery bypass grafting at [facility], at  which time he had a three-vessel bypass with the left internal mammary to  the left anterior descending coronary artery, reverse saphenous vein graft  to the first obtuse marginal coronary artery, and a reverse saphenous vein  graft to the distal right coronary artery.  Two grafts were totally  occluded.  He had a patent mammary to the LAD, which now was totally  occluded and not feeding the diagonal.  The patient had developed  progressive major disease now with greater than 80% left main obstruction,  feeding a diagonal coronary artery.  In addition he had disease of the  distal circumflex and a total occlusion of the right coronary artery.  Because of his recurrent unstable anginal symptoms, a redo coronary artery  bypass grafting was  recommended to the patient who agreed and signed the  informed consent.   DESCRIPTION OF PROCEDURE:  With Swan-Ganz and arterial line monitoring in  place, the patient underwent general endotracheal anesthesia.  With a  negative Allen's test in the left hand, the left arm was left out and  prepped.  Using a curvilinear incision over the volar surface of the  forearm, the radial artery was identified and dissected out using the  Harmonic scalpel.  The vessel was of good quality and caliber.  The radial  artery was removed.  The skin was loosely reapproximated with #2-0 Vicryl in  the subcutaneous tissue.   Attention was then turned to the right thigh, where using the guide and Endo-  vein harvesting system, two segments of thigh vein were harvested and were  of adequate quality and caliber.  A redo median sternotomy was performed  with a sagittal saw.  The underlying mediastinal tissue was dissected off of  the posterior table of the sternum.  The right internal mammary artery was  then dissected down as a pedicle graft and had an excellent free flow.  The  vessel was hydrostatically dilated with heparinized saline.  The remainder  of the right heart and facing the aorta were dissected out to the point of  allowing cannulation.  The patient was systemically heparinized.  The  ascending aorta and the right atrium were cannulated, and the aortic root  vent cardioplegia needle was placed in the ascending aorta.  The patient was  placed on cardiopulmonary bypass 2.4 L per minute per sq/m.  The remainder  of the left heart was dissected out.  The left internal mammary artery was  identified and preserved.  A retrograde cardioplegic catheter was placed.  The patient's body temperature was cooled to 30 degrees.  The aortic  crossclamp was applied, and 500 mL of cold blood potassium cardioplegia was  administered antegrade.  A bulldog was placed on the mammary artery.  Additional cold blood  cardioplegia was administered retrograde.   Attention was turned first to the distal circumflex vessel, which was opened  and was 1.3-1.4 mm in size.  Using a running #8-0 Prolene, the radial artery  was anastomosed to the distal circumflex coronary artery.  Attention was then turned to the previously-bypassed first obtuse marginal,  which was opened just distal to the previous graft insertion site.  The  vessel was small and diffusely diseased at the site of anastomosis.  Using a  running #7-0 Prolene, the distal anastomosis was performed.  Additional cold  blood cardioplegia was administered down the vein graft.   Attention was then turned to the posterior descending which was opened and  was also a small vessel 1.2 to 1.3 mm in size.  Using a running #7-0  Prolene, a distal anastomosis was performed with a segment of reverse  saphenous vein graft.  The diagonal coronary artery was identified and  dissected out.  This vessel was opened and was approximately 1.2 to 1.3 mm  in size.  Using the right internal mammary artery as a pedicle graft across  the midline, over the upper aorta, it was then anastomosed to the diagonal  coronary artery with a running #8-0 Prolene.  With the release of the  bulldog on the right internal mammary artery, was increased temperature in  the myocardial septal.  Briefly the bulldog was replaced.  With the aortic  crossclamp still in place, the two vein grafts were anastomosed to the  ascending aorta.  The heart was then deaired as the anastomoses were  completed, and the crossclamp was removed.  The total crossclamp time was  138 minutes.  The sites of the anastomoses were inspected and were free of  bleeding.  The patient's body temperature was then rewarmed to 37 degrees,  and he was then ventilated and weaned from cardiopulmonary bypass without  difficulty.  He remained hemodynamically stable.  A right femoral arterial line had been placed to adequately  monitor his blood pressure when the  radial line was not functioning.  The total pump time was 181 minutes.  He  was decannulated in the usual fashion.  Protamine sulfate was administered.  With the operative field hemostatic, two atrial and two ventricular pacing  wires were applied.  A single graft marker was applied.   The chest was closed with #6 stainless steel wire, the fascia closed with  interrupted #0 Vicryl, running #3-0 Vicryl in the subcutaneous  tissue, and  #4-0 subcuticular stitch in the skin edges.  Dry dressings were applied.  The sponge and needle counts were reported as correct at the completion of  the procedure.  The patient tolerated the procedure without obvious complications, and was  transferred to the surgical intensive care unit for further postoperative  care.  With the patient's small distal vessels and diffuse disease, he will  probably not be a candidate for a third-time redo coronary artery bypass  grafting.                                                Gwenith Daily Tyrone Sage, M.D.    Tyson Babinski  D:  07/09/2003  T:  07/09/2003  Job:  [id number]   cc:   Salvadore Farber, M.D.

## 2011-04-30 NOTE — Cardiovascular Report (Signed)
NAME:  Matthew [name], Matthew [name]                          ACCOUNT NO.:  [id number]   MEDICAL RECORD NO.:  [id number]                   PATIENT TYPE:  INP   LOCATION:  4736                                 FACILITY:  [facility]   PHYSICIAN:  Salvadore Farber, M.D.             DATE OF BIRTH:  1936-10-03   DATE OF PROCEDURE:  06/28/2003  DATE OF DISCHARGE:                              CARDIAC CATHETERIZATION   PROCEDURE:  Left heart catheterization, left ventriculography, coronary  angiography, saphenous vein graft angiography x2, left subclavian  angiography, left internal mammary artery angiography.   INDICATIONS:  The patient is a 75 year old gentleman status post coronary  artery bypass grafting in 1991.  Most recent catheterization was in 1997  performed in [place], [place].  This demonstrated patent LIMA to LAD,  patent saphenous vein graft to circumflex, and occluded saphenous vein graft  to the RCA.  A Cardiolite study in October 2003 demonstrated inferior scar  with trivial inferior ischemia and an EF of 52%.  He now presents with  unstable angina.  He was admitted to hospital and ruled out for myocardial  infarction.  He was referred for diagnostic angiography.   PROCEDURAL TECHNIQUE:  Informed consent was obtained.  Under 1% lidocaine  local anesthesia a 6-French sheath was placed in the right femoral artery  using the modified Seldinger technique.  With some difficulty a Wholey wire  was manipulated through the iliac system into the abdominal aorta.  Diagnostic angiography was then performed using JL4 and JR4 for the native  coronaries, JR4 for the saphenous vein grafts, JR4 for the left subclavian,  and a LIMA catheter for the left internal mammary artery.  Ventriculography  was performed in the RAO projection by power injection.  The pigtail  catheter was then pulled back to the suprarenal abdominal aorta and  abdominal aortography was performed by power injection.  The patient  tolerated the procedure well and was transferred to the holding room in  stable condition.   COMPLICATIONS:  None.   FINDINGS:  1. LV 164/8/19.  Assessment of EF was complicated by ventricular ectopy.     However, the ventricular function appears preserved with posterobasal     dyskinesis.  2. No aortic stenosis.  3. Assessment of mitral regurgitation complicated by ventricular ectopy.  4. Left main:  The left main is diffusely diseased with an 80% stenosis     distally.  5. LAD:  The LAD is a large vessel giving rise to a single large diagonal     branch.  After the takeoff of the first septal perforator and the first     diagonal the LAD is occluded.  The diagonal has a 50-60% stenosis in its     proximal portion.  The LIMA to LAD is widely patent with excellent distal     runoff.  6. Circumflex:  The circumflex is a large vessel giving rise to  three obtuse     marginals.  There is a 60-70% stenosis of the mid vessel before the     takeoff of the second obtuse marginal.  The saphenous vein graft to the     second obtuse marginal is diffusely diseased with serial 80 and 90%     stenoses.  7. RCA:  The RCA is diffusely diseased proximally and occluded in its mid     section.  The saphenous vein graft to the RCA is occluded as previously     demonstrated.  The distal RCA fills modestly well via collaterals from     the left, primarily via the septal perforator.  8. Abdominal aorta:  Single renal artery supplying the left.  This appears     normal.  Relatively poor visualization of what appeared to be two vessels     supplying the right kidney.  Both common iliacs are mildly aneurysmally     dilated with ostial stenoses.  The ostial stenosis on the left appears     severe.  On the right it is at least moderate.    IMPRESSION/RECOMMENDATIONS:  1. Occluded saphenous vein graft to posterior descending artery.  2. Severely degenerated saphenous vein graft to obtuse marginal.  3. 80%  left main stenosis which limits flow to a large first diagonal     branch, moderate sized first and third obtuse marginals, and the     posterior descending artery (via septal collaterals).  Ventricular     function appears preserved.  4. Patent left internal mammary artery to left anterior descending.   The patient has severely degenerated vein graft to the obtuse marginal which  appears to be the culprit for his acute coronary syndrome.  Recommend  consideration of redo coronary artery bypass grafting as this would provide  the most complete revascularization and intervention on his degenerated vein  graft.  There is a relatively low likelihood of good long-term outcome.                                               Salvadore Farber, M.D.    WED/MEDQ  D:  06/28/2003  T:  07/01/2003  Job:  [id number]

## 2011-05-28 ENCOUNTER — Encounter: Payer: Self-pay | Admitting: Cardiovascular Disease

## 2011-06-05 ENCOUNTER — Other Ambulatory Visit: Payer: Self-pay | Admitting: Internal Medicine

## 2011-06-15 ENCOUNTER — Other Ambulatory Visit (HOSPITAL_COMMUNITY): Payer: Self-pay | Admitting: Family Medicine

## 2011-08-24 ENCOUNTER — Ambulatory Visit (INDEPENDENT_AMBULATORY_CARE_PROVIDER_SITE_OTHER): Payer: Medicare Other | Admitting: Cardiovascular Disease

## 2011-08-24 ENCOUNTER — Encounter: Payer: Self-pay | Admitting: Cardiovascular Disease

## 2011-08-24 DIAGNOSIS — I2510 Atherosclerotic heart disease of native coronary artery without angina pectoris: Secondary | ICD-10-CM

## 2011-08-24 DIAGNOSIS — I359 Nonrheumatic aortic valve disorder, unspecified: Secondary | ICD-10-CM

## 2011-08-24 DIAGNOSIS — E785 Hyperlipidemia, unspecified: Secondary | ICD-10-CM

## 2011-08-24 DIAGNOSIS — I10 Essential (primary) hypertension: Secondary | ICD-10-CM

## 2011-08-24 NOTE — Patient Instructions (Signed)
You are doing well. No medication changes were made. Please call us if you have new issues that need to be addressed before your next appt.  We will call you for a follow up Appt. In 6 months  

## 2011-08-24 NOTE — Assessment & Plan Note (Signed)
Currently with rare symptoms of angina With heavy exertion. No further workup at this time. Continue current medication regimen. He would be an acceptable risk for his upcoming hernia surgery. No further testing is needed.

## 2011-08-24 NOTE — Progress Notes (Signed)
Patient ID: Matthew [name], male    DOB: 04/05/1936, 75 y.o.   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 Also has h/o mild aortic stenosis, HTN and hyperlipidemia.   Was seen by Dr. Shirlee Latch for an unscheduled visit in June 2011 after experiencing chest pain and abdominal pain with diarrhea. He was referred for echo and stress test. Echo normal EF with very mild AS. Stress test no ischemia. Symptoms resolved with antacid. Also found to have gallstones and underwent cholecystectomy in July 2011.   He reports that he has been doing well. 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 He has had some pain in his inguinal region 2 months ago on several occasions. Denies any significant chest pain, but does have an occasional discomfort with heavy exertion. This has been stable, not getting worse and typically takes one hour of heavy labor or "weedeating" to reproduce the discomfort.   Doing very well. Remains active around his property. No CP or SOB. Taking all meds without problem. No syncope or presyncope.     Dr. Shelva Majestic is following lipids.  EKG shows sinus bradycardia with rate 54 beats per minute with no significant ST or T wave changes   Past Medical History: 1.        --Myoview 7/09 EF 54%, inferobasal infarct, no ischemia.        --Myoview. 6/10 EF 43% inferior wall infarct. no ischemia.       -- Echo 6/10: EF 55% mild AS mean 7 2.  Hyperlipidemia 3.  Hypertension 4.  Nephrolithiasis 5.  Aortic Stenosis, mild 6.  Carotid Bruits:  2009 0-39% on dopplers bilaterally. 7.  OSA    Outpatient Encounter Prescriptions as of 08/24/2011  Medication Sig Dispense Refill  . acetaminophen (TYLENOL) 500 MG tablet Take 500 mg by mouth every 6 (six) hours as needed. 1-2 tablets        . aspirin buffered (BUFFERIN) 325 MG TABS tablet Take 325 mg by mouth daily.        Marland Kitchen esomeprazole (NEXIUM) 40 MG capsule Take 40  mg by mouth every other day.        . hydrochlorothiazide 25 MG tablet Take 25 mg by mouth daily.        . isosorbide dinitrate (ISORDIL) 10 MG tablet TAKE ONE TABLET BY MOUTH THREE TIMES DAILY  90 tablet  6  . lisinopril (PRINIVIL,ZESTRIL) 20 MG tablet Take 20 mg by mouth daily.        . metoprolol (LOPRESSOR) 50 MG tablet Take 25 mg by mouth 2 (two) times daily.        . nitroGLYCERIN (NITROSTAT) 0.4 MG SL tablet Place 0.4 mg under the tongue every 5 (five) minutes as needed.        . Omega-3 Fatty Acids (FISH OIL CONCENTRATE) 1000 MG CAPS Take 1 capsule by mouth daily.        Marland Kitchen VYTORIN 10-80 MG per tablet TAKE ONE TABLET BY MOUTH EVERY DAY  90 each  3     Review of Systems  Constitutional: Negative.   HENT: Negative.   Eyes: Negative.   Respiratory: Negative.   Cardiovascular: Negative.   Gastrointestinal: Negative.   Musculoskeletal: Negative.   Skin: Negative.   Neurological: Negative.   Hematological: Negative.   Psychiatric/Behavioral: Negative.   All other systems reviewed and are negative.  BP 174/74  Pulse 54  Ht 5\' 9"  (1.753 m)  Wt 198 lb (89.812 kg)  BMI 29.24 kg/m2 He did not take his medications this morning.  Physical Exam  Nursing note and vitals reviewed. Constitutional: He is oriented to person, place, and time. He appears well-developed and well-nourished.  HENT:  Head: Normocephalic.  Nose: Nose normal.  Mouth/Throat: Oropharynx is clear and moist.  Eyes: Conjunctivae are normal. Pupils are equal, round, and reactive to light.  Neck: Normal range of motion. Neck supple. No JVD present.  Cardiovascular: Normal rate, regular rhythm, S1 normal, S2 normal, normal heart sounds and intact distal pulses.  Exam reveals no gallop and no friction rub.   No murmur heard. Pulmonary/Chest: Effort normal and breath sounds normal. No respiratory distress. He has no wheezes. He has no rales. He exhibits no tenderness.  Abdominal: Soft. Bowel sounds are normal. He  exhibits no distension. There is no tenderness.  Musculoskeletal: Normal range of motion. He exhibits no edema and no tenderness.  Lymphadenopathy:    He has no cervical adenopathy.  Neurological: He is alert and oriented to person, place, and time. Coordination normal.  Skin: Skin is warm and dry. No rash noted. No erythema.  Psychiatric: He has a normal mood and affect. His behavior is normal. Judgment and thought content normal.           Assessment and Plan

## 2011-08-24 NOTE — Assessment & Plan Note (Signed)
Blood pressure is elevated today. He did not take his morning medications. We have suggested he take his medications on the morning of the surgery.

## 2011-08-24 NOTE — Assessment & Plan Note (Signed)
Cholesterol is at goal on the current lipid regimen. No changes to the medications were made.  

## 2011-11-08 ENCOUNTER — Encounter (HOSPITAL_COMMUNITY)
Admission: RE | Admit: 2011-11-08 | Discharge: 2011-11-08 | Disposition: A | Discharge status: Home / Self Care | Payer: Medicare Other | Source: Ambulatory Visit | Attending: General Surgery | Admitting: General Surgery

## 2011-11-08 ENCOUNTER — Encounter (HOSPITAL_COMMUNITY): Payer: Self-pay

## 2011-11-08 HISTORY — DX: Unspecified hearing loss, unspecified ear: H91.90

## 2011-11-08 HISTORY — DX: Shortness of breath: R06.02

## 2011-11-08 HISTORY — DX: Acute myocardial infarction, unspecified: I21.9

## 2011-11-08 HISTORY — DX: Gastro-esophageal reflux disease without esophagitis: K21.9

## 2011-11-08 LAB — DIFFERENTIAL
Basophils Relative: 1 % (ref 0–1)
Eosinophils Absolute: 0.1 10*3/uL (ref 0.0–0.7)
Eosinophils Relative: 2 % (ref 0–5)
Lymphs Abs: 2.1 10*3/uL (ref 0.7–4.0)
Monocytes Absolute: 0.6 10*3/uL (ref 0.1–1.0)
Monocytes Relative: 9 % (ref 3–12)

## 2011-11-08 LAB — BASIC METABOLIC PANEL
BUN: 15 mg/dL (ref 6–23)
Calcium: 9.8 mg/dL (ref 8.4–10.5)
Creatinine, Ser: 1.19 mg/dL (ref 0.50–1.35)
GFR calc Af Amer: 68 mL/min — ABNORMAL LOW (ref >90)
GFR calc non Af Amer: 59 mL/min — ABNORMAL LOW (ref >90)
Glucose, Bld: 119 mg/dL — ABNORMAL HIGH (ref 70–99)

## 2011-11-08 LAB — CBC
Hemoglobin: 13.5 g/dL (ref 13.0–17.0)
MCH: 28.7 pg (ref 26.0–34.0)
MCHC: 33.5 g/dL (ref 30.0–36.0)
MCV: 85.6 fL (ref 78.0–100.0)
RBC: 4.71 MIL/uL (ref 4.22–5.81)

## 2011-11-08 LAB — SURGICAL PCR SCREEN: MRSA, PCR: NEGATIVE

## 2011-11-08 NOTE — Patient Instructions (Addendum)
Matthew [name]  11/08/2011   Your procedure is scheduled on:  11/10/2011  Report to [facility] at  0840  AM.  Call this number if you have problems the morning of surgery: [phone]   Remember:   Do not eat food:After Midnight.  May have clear liquids:until Midnight .  Clear liquids include soda, tea, black coffee, apple or grape juice, broth.  Take these medicines the morning of surgery with A SIP OF WATER: nexium,isordil,lisinopril,metoprolol   Do not wear jewelry, make-up or nail polish.  Do not wear lotions, powders, or perfumes. You may wear deodorant.  Do not shave 48 hours prior to surgery.  Do not bring valuables to the hospital.  Contacts, dentures or bridgework may not be worn into surgery.  Leave suitcase in the car. After surgery it may be brought to your room.  For patients admitted to the hospital, checkout time is 11:00 AM the day of discharge.   Patients discharged the day of surgery will not be allowed to drive home.  Name and phone number of your driver: family  Special Instructions: CHG Shower Use Special Wash: 1/2 bottle night before surgery and 1/2 bottle morning of surgery.   Please read over the following fact sheets that you were given: Pain Booklet, MRSA Information, Surgical Site Infection Prevention, Anesthesia Post-op Instructions and Care and Recovery After Surgery Inguinal Hernia, Adult Muscles help keep everything in the body in its proper place. But if a weak spot in the muscles develops, something can poke through. That is called a hernia. When this happens in the lower part of the belly (abdomen), it is called an inguinal hernia. (It takes its name from a part of the body in this region called the inguinal canal.) A weak spot in the wall of muscles lets some fat or part of the small intestine bulge through. An inguinal hernia can develop at any age. Men get them more often than women. CAUSES  In adults, an inguinal hernia develops over time.  It can  be triggered by:   Suddenly straining the muscles of the lower abdomen.   Lifting heavy objects.   Straining to have a bowel movement. Difficult bowel movements (constipation) can lead to this.   Constant coughing. This may be caused by smoking or lung disease.   Being overweight.   Being pregnant.   Working at a job that requires long periods of standing or heavy lifting.   Having had an inguinal hernia before.  One type can be an emergency situation. It is called a strangulated inguinal hernia. It develops if part of the small intestine slips through the weak spot and cannot get back into the abdomen. The blood supply can be cut off. If that happens, part of the intestine may die. This situation requires emergency surgery. SYMPTOMS  Often, a small inguinal hernia has no symptoms. It is found when a healthcare provider does a physical exam. Larger hernias usually have symptoms.   In adults, symptoms may include:   A lump in the groin. This is easier to see when the person is standing. It might disappear when lying down.   In men, a lump in the scrotum.   Pain or burning in the groin. This occurs especially when lifting, straining or coughing.   A dull ache or feeling of pressure in the groin.   Signs of a strangulated hernia can include:   A bulge in the groin that becomes very painful and tender to  the touch.   A bulge that turns red or purple.   Fever, nausea and vomiting.   Inability to have a bowel movement or to pass gas.  DIAGNOSIS  To decide if you have an inguinal hernia, a healthcare provider will probably do a physical examination.  This will include asking questions about any symptoms you have noticed.   The healthcare provider might feel the groin area and ask you to cough. If an inguinal hernia is felt, the healthcare provider may try to slide it back into the abdomen.   Usually no other tests are needed.  TREATMENT  Treatments can vary. The size of the  hernia makes a difference. Options include:  Watchful waiting. This is often suggested if the hernia is small and you have had no symptoms.   No medical procedure will be done unless symptoms develop.   You will need to watch closely for symptoms. If any occur, contact your healthcare provider right away.   Surgery. This is used if the hernia is larger or you have symptoms.   Open surgery. This is usually an outpatient procedure (you will not stay overnight in a hospital). An cut (incision) is made through the skin in the groin. The hernia is put back inside the abdomen. The weak area in the muscles is then repaired by herniorrhaphy or hernioplasty. Herniorrhaphy: in this type of surgery, the weak muscles are sewn back together. Hernioplasty: a patch or mesh is used to close the weak area in the abdominal wall.   Laparoscopy. In this procedure, a surgeon makes small incisions. A thin tube with a tiny video camera (called a laparoscope) is put into the abdomen. The surgeon repairs the hernia with mesh by looking with the video camera and using two long instruments.  HOME CARE INSTRUCTIONS   After surgery to repair an inguinal hernia:   You will need to take pain medicine prescribed by your healthcare provider. Follow all directions carefully.   You will need to take care of the wound from the incision.   Your activity will be restricted for awhile. This will probably include no heavy lifting for several weeks. You also should not do anything too active for a few weeks. When you can return to work will depend on the type of job that you have.   During "watchful waiting" periods, you should:   Maintain a healthy weight.   Eat a diet high in fiber (fruits, vegetables and whole grains).   Drink plenty of fluids to avoid constipation. This means drinking enough water and other liquids to keep your urine clear or pale yellow.   Do not lift heavy objects.   Do not stand for long periods of  time.   Quit smoking. This should keep you from developing a frequent cough.  SEEK MEDICAL CARE IF:   A bulge develops in your groin area.   You feel pain, a burning sensation or pressure in the groin. This might be worse if you are lifting or straining.   You develop a fever of more than 100.5 F (38.1 C).  SEEK IMMEDIATE MEDICAL CARE IF:   Pain in the groin increases suddenly.   A bulge in the groin gets bigger suddenly and does not go down.   For men, there is sudden pain in the scrotum. Or, the size of the scrotum increases.   A bulge in the groin area becomes red or purple and is painful to touch.   You have nausea  or vomiting that does not go away.   You feel your heart beating much faster than normal.   You cannot have a bowel movement or pass gas.   You develop a fever of more than 102.0 F (38.9 C).  Document Released: 04/17/2009 Document Revised: 08/11/2011 Document Reviewed: 04/17/2009 [facility] Patient Information 2012 [place], [place].PATIENT INSTRUCTIONS POST-ANESTHESIA  IMMEDIATELY FOLLOWING SURGERY:  Do not drive or operate machinery for the first twenty four hours after surgery.  Do not make any important decisions for twenty four hours after surgery or while taking narcotic pain medications or sedatives.  If you develop intractable nausea and vomiting or a severe headache please notify your doctor immediately.  FOLLOW-UP:  Please make an appointment with your surgeon as instructed. You do not need to follow up with anesthesia unless specifically instructed to do so.  WOUND CARE INSTRUCTIONS (if applicable):  Keep a dry clean dressing on the anesthesia/puncture wound site if there is drainage.  Once the wound has quit draining you may leave it open to air.  Generally you should leave the bandage intact for twenty four hours unless there is drainage.  If the epidural site drains for more than 36-48 hours please call the anesthesia department.  QUESTIONS?:  Please  feel free to call your physician or the hospital operator if you have any questions, and they will be happy to assist you.     [facility] Anesthesia Department [address] [place] [phone]

## 2011-11-09 NOTE — H&P (Signed)
  NTS SOAP Note  Vital Signs:  Vitals as of: 10/07/2011: Systolic 139: Diastolic 70: Heart Rate 56: Temp 68F: Height 63ft 9in: Weight 196Lbs 0 Ounces: Pain Level 0: BMI 29  BMI : 28.94 kg/m2  Subjective: This 5 Years 7 Months old Male presents for of known RIH.  States has had more discomfort.  Still with a bulge.  Has had recent cardio work-up and apparently cleared per patient.  No change with BM.  No change with urination.  Review of Symptoms:  Constitutional:unremarkable Head:unremarkable Eyes:unremarkable Nose/Mouth/Throat:unremarkable Cardiovascular:unremarkable Respiratory:unremarkable Gastrointestinal:unremarkableBilateral hernias Genitourinary:unremarkable Musculoskeletal:unremarkable Skin:unremarkable Breast:unremarkable Hematolgic/Lymphatic:unremarkable Allergic/Immunologic:unremarkable   Past Medical History:Reviewed   Past Medical History  Surgical History: CABG Medical Problems: GERD, HTN, Hypercholesterolemia Psychiatric History: none Allergies: none Medications: Nexium, ASA, Hydrochlorot, Lisinopril, Isosorbide, Metoprolol, Vytorin.   Social History:Reviewed   Social History  Preferred Language: English ([place]) Race:  White Ethnicity: Not Hispanic / Latino Age: 75 Years 9 Months Marital Status:  S Alcohol:  No Recreational drug(s):  No   Smoking Status: Never smoker reviewed on 06/29/2011  Family History:Reviewed   Family History  Is there a family history of:CAD otherwise non-contributory    Objective Information: General:Well appearing, well nourished in no distress.elderly Skin:no rash or prominent lesions Head:Atraumatic; no masses; no abnormalities Eyes:conjunctiva clear, EOM intact, PERRL Mouth:Mucous membranes moist, no mucosal lesions. Neck:Supple without lymphadenopathy.  Heart:RRR, no murmur Lungs:CTA bilaterally, no wheezes, rhonchi, rales.   Breathing unlabored. Abdomen:Soft, NT/ND, no HSM, no masses.Bilateral inguinal hernia.  Assessment:  Diagnosis &amp; Procedure: DiagnosisCode: 550.92, ProcedureCode: 21308,    Plan: Right hernia has more symptoms.  Will proceed upon cardiac clearance.  Patient to call with issues in the interim.  Patient Education:Alternative treatments to surgery were discussed with patient (and family).Risks and benefits  of procedure were fully explained to the patient (and family) who gave informed consent. Patient/family questions were addressed.  Follow-up:Pending Surgery

## 2011-11-10 ENCOUNTER — Encounter (HOSPITAL_COMMUNITY): Payer: Self-pay

## 2011-11-10 ENCOUNTER — Ambulatory Visit (HOSPITAL_COMMUNITY)
Admission: RE | Admit: 2011-11-10 | Discharge: 2011-11-10 | Disposition: A | Discharge status: Home / Self Care | Payer: Medicare Other | Source: Ambulatory Visit | Attending: General Surgery | Admitting: General Surgery

## 2011-11-10 ENCOUNTER — Encounter (HOSPITAL_COMMUNITY): Payer: Self-pay | Admitting: Anesthesiology

## 2011-11-10 ENCOUNTER — Encounter (HOSPITAL_COMMUNITY)
Admission: RE | Disposition: A | Discharge status: Home / Self Care | Payer: Self-pay | Source: Ambulatory Visit | Attending: General Surgery

## 2011-11-10 ENCOUNTER — Ambulatory Visit (HOSPITAL_COMMUNITY): Payer: Medicare Other | Admitting: Anesthesiology

## 2011-11-10 DIAGNOSIS — Z01812 Encounter for preprocedural laboratory examination: Secondary | ICD-10-CM | POA: Insufficient documentation

## 2011-11-10 DIAGNOSIS — Z79899 Other long term (current) drug therapy: Secondary | ICD-10-CM | POA: Insufficient documentation

## 2011-11-10 DIAGNOSIS — Z951 Presence of aortocoronary bypass graft: Secondary | ICD-10-CM | POA: Insufficient documentation

## 2011-11-10 DIAGNOSIS — I10 Essential (primary) hypertension: Secondary | ICD-10-CM | POA: Insufficient documentation

## 2011-11-10 DIAGNOSIS — Z7982 Long term (current) use of aspirin: Secondary | ICD-10-CM | POA: Insufficient documentation

## 2011-11-10 DIAGNOSIS — E785 Hyperlipidemia, unspecified: Secondary | ICD-10-CM | POA: Insufficient documentation

## 2011-11-10 DIAGNOSIS — K4020 Bilateral inguinal hernia, without obstruction or gangrene, not specified as recurrent: Secondary | ICD-10-CM | POA: Insufficient documentation

## 2011-11-10 HISTORY — PX: INGUINAL HERNIA REPAIR: SHX194

## 2011-11-10 SURGERY — REPAIR, HERNIA, INGUINAL, ADULT
Anesthesia: Spinal | Site: Groin | Laterality: Right | Wound class: Clean

## 2011-11-10 MED ORDER — BUPIVACAINE HCL (PF) 0.5 % IJ SOLN
INTRAMUSCULAR | Status: AC
  Filled 2011-11-10: qty 30

## 2011-11-10 MED ORDER — CELECOXIB 100 MG PO CAPS: 400.0000 mg | ORAL_CAPSULE | Freq: Every day | ORAL | Status: DC

## 2011-11-10 MED ORDER — CEFAZOLIN SODIUM 1-5 GM-% IV SOLN: 1.0000 g | INTRAVENOUS | Status: DC

## 2011-11-10 MED ORDER — FENTANYL CITRATE 0.05 MG/ML IJ SOLN
INTRAMUSCULAR | Status: AC
  Filled 2011-11-10: qty 5

## 2011-11-10 MED ORDER — MIDAZOLAM HCL 2 MG/2ML IJ SOLN
INTRAMUSCULAR | Status: AC
  Administered 2011-11-10: 2 mg via INTRAVENOUS
  Filled 2011-11-10: qty 2

## 2011-11-10 MED ORDER — MIDAZOLAM HCL 2 MG/2ML IJ SOLN
INTRAMUSCULAR | Status: AC
  Filled 2011-11-10: qty 2

## 2011-11-10 MED ORDER — EPHEDRINE SULFATE 50 MG/ML IJ SOLN
INTRAMUSCULAR | Status: DC | PRN
  Administered 2011-11-10 (×4): 10 mg via INTRAVENOUS

## 2011-11-10 MED ORDER — HYDROCODONE-ACETAMINOPHEN 5-325 MG PO TABS: 1.0000 | ORAL_TABLET | ORAL | Status: AC | PRN

## 2011-11-10 MED ORDER — CEFAZOLIN SODIUM 1-5 GM-% IV SOLN
INTRAVENOUS | Status: AC
  Filled 2011-11-10: qty 50

## 2011-11-10 MED ORDER — PROPOFOL 10 MG/ML IV EMUL
INTRAVENOUS | Status: DC | PRN
  Administered 2011-11-10: 25 ug/kg/min via INTRAVENOUS

## 2011-11-10 MED ORDER — FENTANYL CITRATE 0.05 MG/ML IJ SOLN: 25.0000 ug | INTRAMUSCULAR | Status: DC | PRN

## 2011-11-10 MED ORDER — MIDAZOLAM HCL 2 MG/2ML IJ SOLN
1.0000 mg | INTRAMUSCULAR | Status: DC | PRN
  Administered 2011-11-10 (×2): 2 mg via INTRAVENOUS

## 2011-11-10 MED ORDER — SODIUM CHLORIDE 0.9 % IR SOLN
Status: DC | PRN
  Administered 2011-11-10: 1

## 2011-11-10 MED ORDER — BUPIVACAINE HCL (PF) 0.5 % IJ SOLN
INTRAMUSCULAR | Status: DC | PRN
  Administered 2011-11-10: 10 mL

## 2011-11-10 MED ORDER — LACTATED RINGERS IV SOLN
INTRAVENOUS | Status: DC
  Administered 2011-11-10 (×2): via INTRAVENOUS

## 2011-11-10 MED ORDER — ENOXAPARIN SODIUM 30 MG/0.3ML ~~LOC~~ SOLN
30.0000 mg | Freq: Once | SUBCUTANEOUS | Status: AC
  Administered 2011-11-10: 30 mg via SUBCUTANEOUS

## 2011-11-10 MED ORDER — CEFAZOLIN SODIUM 1-5 GM-% IV SOLN
INTRAVENOUS | Status: DC | PRN
  Administered 2011-11-10: 1 g via INTRAVENOUS

## 2011-11-10 MED ORDER — PROPOFOL 10 MG/ML IV EMUL
INTRAVENOUS | Status: AC
  Filled 2011-11-10: qty 20

## 2011-11-10 MED ORDER — ONDANSETRON HCL 4 MG/2ML IJ SOLN: 4.0000 mg | Freq: Once | INTRAMUSCULAR | Status: DC | PRN

## 2011-11-10 MED ORDER — FENTANYL CITRATE 0.05 MG/ML IJ SOLN
INTRAMUSCULAR | Status: DC | PRN
  Administered 2011-11-10: 50 ug via INTRAVENOUS

## 2011-11-10 MED ORDER — ENOXAPARIN SODIUM 30 MG/0.3ML ~~LOC~~ SOLN
SUBCUTANEOUS | Status: AC
  Administered 2011-11-10: 30 mg via SUBCUTANEOUS
  Filled 2011-11-10: qty 0.3

## 2011-11-10 SURGICAL SUPPLY — 38 items
APL SKNCLS STERI-STRIP NONHPOA (GAUZE/BANDAGES/DRESSINGS) ×1
BAG HAMPER (MISCELLANEOUS) ×2 IMPLANT
BENZOIN TINCTURE PRP APPL 2/3 (GAUZE/BANDAGES/DRESSINGS) ×2 IMPLANT
CLOSURE STERI STRIP 1/2 X4 (GAUZE/BANDAGES/DRESSINGS) ×1 IMPLANT
CLOTH BEACON ORANGE TIMEOUT ST (SAFETY) ×2 IMPLANT
COVER LIGHT HANDLE STERIS (MISCELLANEOUS) ×4 IMPLANT
DECANTER SPIKE VIAL GLASS SM (MISCELLANEOUS) ×2 IMPLANT
DRAIN PENROSE 18X.75 LTX STRL (MISCELLANEOUS) ×2 IMPLANT
DURAPREP 26ML APPLICATOR (WOUND CARE) ×2 IMPLANT
ELECT REM PT RETURN 9FT ADLT (ELECTROSURGICAL) ×2
ELECTRODE REM PT RTRN 9FT ADLT (ELECTROSURGICAL) ×1 IMPLANT
FORMALIN 10 PREFIL 120ML (MISCELLANEOUS) ×2 IMPLANT
GLOVE BIOGEL PI IND STRL 7.5 (GLOVE) ×1 IMPLANT
GLOVE BIOGEL PI INDICATOR 7.5 (GLOVE) ×1
GLOVE ECLIPSE 7.0 STRL STRAW (GLOVE) ×2 IMPLANT
GOWN STRL REIN XL XLG (GOWN DISPOSABLE) ×6 IMPLANT
INST SET MINOR GENERAL (KITS) ×2 IMPLANT
KIT ROOM TURNOVER APOR (KITS) ×2 IMPLANT
MANIFOLD NEPTUNE II (INSTRUMENTS) ×2 IMPLANT
MESH MARLEX PLUG MEDIUM (Mesh General) ×1 IMPLANT
NDL HYPO 25X1 1.5 SAFETY (NEEDLE) ×1 IMPLANT
NEEDLE HYPO 25X1 1.5 SAFETY (NEEDLE) ×2 IMPLANT
NS IRRIG 1000ML POUR BTL (IV SOLUTION) ×2 IMPLANT
PACK MINOR (CUSTOM PROCEDURE TRAY) ×2 IMPLANT
PAD ARMBOARD 7.5X6 YLW CONV (MISCELLANEOUS) ×2 IMPLANT
SET BASIN LINEN APH (SET/KITS/TRAYS/PACK) ×2 IMPLANT
STRIP CLOSURE SKIN 1/2X4 (GAUZE/BANDAGES/DRESSINGS) ×2 IMPLANT
SUT ETHIBOND NAB MO 7 #0 18IN (SUTURE) ×2 IMPLANT
SUT MNCRL AB 4-0 PS2 18 (SUTURE) ×2 IMPLANT
SUT SILK 2 0 (SUTURE)
SUT SILK 2-0 18XBRD TIE 12 (SUTURE) IMPLANT
SUT VIC AB 2-0 CT1 27 (SUTURE) ×2
SUT VIC AB 2-0 CT1 TAPERPNT 27 (SUTURE) ×1 IMPLANT
SUT VIC AB 3-0 SH 27 (SUTURE) ×2
SUT VIC AB 3-0 SH 27X BRD (SUTURE) ×1 IMPLANT
SUT VICRYL AB 3 0 TIES (SUTURE) IMPLANT
SYR BULB IRRIGATION 50ML (SYRINGE) ×2 IMPLANT
SYR CONTROL 10ML LL (SYRINGE) ×2 IMPLANT

## 2011-11-10 NOTE — Anesthesia Postprocedure Evaluation (Signed)
  Anesthesia Post-op Note  Patient: Matthew [name]  Procedure(s) Performed:  HERNIA REPAIR INGUINAL ADULT  Patient Location: PACU  Anesthesia Type: Spinal  Level of Consciousness: awake, alert  and oriented  Airway and Oxygen Therapy: Patient Spontanous Breathing  Post-op Pain: none  Post-op Assessment: Post-op Vital signs reviewed, Patient's Cardiovascular Status Stable, Respiratory Function Stable and Patent Airway  Post-op Vital Signs: Reviewed and stable  Complications: No apparent anesthesia complications

## 2011-11-10 NOTE — Transfer of Care (Signed)
Immediate Anesthesia Transfer of Care Note  Patient: Matthew [name]  Procedure(s) Performed:  HERNIA REPAIR INGUINAL ADULT  Patient Location: PACU  Anesthesia Type: Spinal  Level of Consciousness: awake, alert  and oriented  Airway & Oxygen Therapy: Patient Spontanous Breathing and Patient connected to face mask oxygen  Post-op Assessment: Report given to PACU RN  Post vital signs: Reviewed and stable  Complications: No apparent anesthesia complications

## 2011-11-10 NOTE — Anesthesia Preprocedure Evaluation (Signed)
Anesthesia Evaluation  Patient identified by MRN, date of birth, ID band Patient awake    Reviewed: Allergy & Precautions, H&P , NPO status , Patient's Chart, lab work & pertinent test results, reviewed documented beta blocker date and time   History of Anesthesia Complications Negative for: history of anesthetic complications  Airway       Dental   Pulmonary shortness of breath and with exertion, sleep apnea , former smoker         Cardiovascular hypertension, Pt. on medications + angina with exertion + CAD, + Past MI and + CABG (twice, last 2004) + Valvular Problems/Murmurs (mild) AS     Neuro/Psych Hx carotid bruits, 30% lesion. No TIA.    GI/Hepatic GERD-  Medicated and Controlled,  Endo/Other    Renal/GU      Musculoskeletal   Abdominal   Peds  Hematology   Anesthesia Other Findings   Reproductive/Obstetrics                           Anesthesia Physical Anesthesia Plan  ASA: III  Anesthesia Plan: Spinal   Post-op Pain Management:    Induction: Intravenous  Airway Management Planned: Nasal Cannula  Additional Equipment:   Intra-op Plan:   Post-operative Plan:   Informed Consent: I have reviewed the patients History and Physical, chart, labs and discussed the procedure including the risks, benefits and alternatives for the proposed anesthesia with the patient or authorized representative who has indicated his/her understanding and acceptance.     Plan Discussed with:   Anesthesia Plan Comments:         Anesthesia Quick Evaluation

## 2011-11-10 NOTE — Interval H&P Note (Signed)
History and Physical Interval Note:  11/10/2011 10:53 AM  Mercer Pod  has presented today for surgery, with the diagnosis of Inguinal hernia without mention of obstruction or gangrene, unilateral or unspecified, (not specified as recurrent) [550.90]  The various methods of treatment have been discussed with the patient and family. After consideration of risks, benefits and other options for treatment, the patient has consented to  Procedure(s): HERNIA REPAIR INGUINAL ADULT as a surgical intervention .  The patients' history has been reviewed, patient examined, no change in status, stable for surgery.  I have reviewed the patients' chart and labs.  Questions were answered to the patient's satisfaction.     Tiger Spieker C

## 2011-11-10 NOTE — Op Note (Signed)
Patient:  Matthew [name]  DOB:  [birth year]  MRN:  [id number]   Preop Diagnosis:  Right inguinal hernia  Postop Diagnosis:  The same  Procedure:  Right inguinal hernia repair with mesh  Surgeon:  Dr. Tilford Pillar  Anes:  Spinal with sedation  Indications:  Patient is a 75 year old male presented to my office with a history of right groin discomfort and a bulge. Workup and evaluation was consistent for a right inguinal hernia risks benefits alternatives a right inguinal hernia repair with mesh were discussed at length the patient including but not limited to risk of bleeding infection infection and mesh requiring removal and subsequent repair ischemic orchiditis paresthesias chronic pain as well as intraoperative pulmonary and cardiac events. Patient's questions and concerns were addressed the patient was consented for the planned procedure  Procedure note:  Patient was taken to the or was placed in a supine position on the operating table. He was then repositioned into a decubitus position in order to place a spinal anesthesia. Once the spinal was administered by the nurse anesthetist the patient was returned back to a supine position. After confirming that the spinal had taken effect his right groin was prepped with DuraPrep solution and draped in standard fashion. A skin incision was created with a 10 blade scalpel additional dissection down to subcuticular tissues carried using a electrocautery Bovie including the division of Scarpa's fascia. Dissection was taken down to the external oblique fascia. This was scored with a 15 blade scalpel and opened medially with Metzenbaum scissors to the external anal ring. The cord structures and hernia sac were identified and were elevated out of the canal create a window behind these structures over the pubic tubercle. A half-inch Penrose drain was utilized to help elevate and mobilize the cord structures. At this point the hernia sac was identified and was  dissected off the cord structures. The sac was twisted upon itself and high ligation was performed and 0 Ethibond suture. The redundant sac was excised and discarded. The remaining sac was returned back into the abdominal cavity. At this point hemostasis was excellent and a mesh plug was placed into the internal inguinal ring. This was secured circumferentially with 0 Ethibond sutures to pexed the plug in place. A mesh onlay was then placed securing it medially to the pubic tubercle inferiorly to the shelving portion of the inguinal ligament and superiorly to the conjoin tendon. The tails of the keyhole defect were placed around the cord structures and tucked under the external oblique fascia. The wound was irrigated. The external oblique fascia was reapproximated using a 2-0 Vicryl in a running continuous fashion a 3-0 Vicryl was then utilized to reapproximate Scarpa's fascia after instilling the local anesthetic. At this point the skin edges were reapproximated using a 4-0 Monocryl in a running subcuticular suture. The skin was washed dried moist dry towel. Benzoin is applied around the incisions half-inch are suture placed in the patient was allowed to come out of the patient and transferred back to the postanesthetic care unit. At the conclusion of procedure all instrument, sponge, needle counts are correct. Patient tolerated procedure well.  Complications:  None apparent  EBL:  Minimal  Specimen:  None

## 2011-11-10 NOTE — Anesthesia Procedure Notes (Signed)
Spinal  Patient location during procedure: OR Start time: 11/10/2011 12:55 PM Staffing CRNA/Resident: Glynn Octave Preanesthetic Checklist Completed: patient identified, site marked, surgical consent, pre-op evaluation, timeout performed, IV checked, risks and benefits discussed and monitors and equipment checked Spinal Block Patient position: sitting Prep: Betadine Patient monitoring: heart rate, cardiac monitor, continuous pulse ox and blood pressure Approach: midline Location: L4-5 Injection technique: single-shot Needle Needle type: Spinocan  Needle gauge: 22 G Needle length: 9 cm Assessment Sensory level: T6 Additional Notes 1203: Marcaine .75%, 2cc with fentanyl injected. CSF aspiration pre and post-injection. Tray # [id number], Exp date 08/2012

## 2011-11-16 ENCOUNTER — Encounter (HOSPITAL_COMMUNITY): Payer: Self-pay | Admitting: General Surgery

## 2012-02-07 ENCOUNTER — Other Ambulatory Visit: Payer: Self-pay | Admitting: Cardiovascular Disease

## 2012-02-07 DIAGNOSIS — E785 Hyperlipidemia, unspecified: Secondary | ICD-10-CM

## 2012-02-07 NOTE — Telephone Encounter (Signed)
Error

## 2012-02-08 NOTE — Telephone Encounter (Signed)
Error

## 2012-02-21 ENCOUNTER — Encounter: Payer: Self-pay | Admitting: Cardiovascular Disease

## 2012-02-21 ENCOUNTER — Ambulatory Visit (INDEPENDENT_AMBULATORY_CARE_PROVIDER_SITE_OTHER): Payer: Medicare Other | Admitting: Cardiovascular Disease

## 2012-02-21 VITALS — BP 130/70 | HR 54 | Ht 69.0 in | Wt 191.0 lb

## 2012-02-21 DIAGNOSIS — E785 Hyperlipidemia, unspecified: Secondary | ICD-10-CM

## 2012-02-21 DIAGNOSIS — I2510 Atherosclerotic heart disease of native coronary artery without angina pectoris: Secondary | ICD-10-CM

## 2012-02-21 DIAGNOSIS — I359 Nonrheumatic aortic valve disorder, unspecified: Secondary | ICD-10-CM

## 2012-02-21 DIAGNOSIS — I10 Essential (primary) hypertension: Secondary | ICD-10-CM

## 2012-02-21 NOTE — Assessment & Plan Note (Signed)
Blood pressure is well controlled on today's visit. No changes made to the medications. 

## 2012-02-21 NOTE — Assessment & Plan Note (Signed)
Currently with no symptoms of angina. No further workup at this time. Continue current medication regimen. 

## 2012-02-21 NOTE — Assessment & Plan Note (Signed)
Mild aortic valve stenosis in 2010.  Asymptomatic on today's visit. Can repeat ultrasound for symptoms of shortness of breath

## 2012-02-21 NOTE — Assessment & Plan Note (Signed)
Goal LDL less than 70 

## 2012-02-21 NOTE — Patient Instructions (Addendum)
You are doing well. No medication changes were made.  Call the office if you have more or worsening chest pains  Please call us if you have new issues that need to be addressed before your next appt.  Your physician wants you to follow-up in: 6 months.  You will receive a reminder letter in the mail two months in advance. If you don't receive a letter, please call our office to schedule the follow-up appointment.  Try citracel for bowels (fiber)

## 2012-02-21 NOTE — Progress Notes (Signed)
Patient ID: Matthew [name], male    DOB: [birth year], 76 y.o.   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 Also has h/o mild aortic stenosis, HTN and hyperlipidemia.    Was seen by Dr. Shirlee Latch for an unscheduled visit in June 2011 after experiencing chest pain and abdominal pain with diarrhea. He was referred for echo and stress test. Echo normal EF with very mild AS. Stress test showed no ischemia. Symptoms resolved with antacid. Also found to have gallstones and underwent cholecystectomy in July 2011.    He reports that he has been doing well. He is busy at baseline. Denies any significant chest pain, but does have an occasional discomfort with heavy exertion. This has been stable, not getting worse. Symptoms mainly in the evening on an occasional basis. Doing very well. Remains active around his property. No CP or SOB. Taking all meds without problem. No syncope or presyncope.     EKG shows sinus bradycardia with rate 54 beats per minute with no significant ST or T wave changes    Past Medical History: 1.         --Myoview 7/09 EF 54%, inferobasal infarct, no ischemia.         --Myoview. 6/10 EF 43% inferior wall infarct. no ischemia.        -- Echo 6/10: EF 55% mild AS mean 7 2.  Hyperlipidemia 3.  Hypertension 4.  Nephrolithiasis 5.  Aortic Stenosis, mild 6.  Carotid Bruits:  2009 0-39% on dopplers bilaterally. 7.  OSA    Outpatient Encounter Prescriptions as of 02/21/2012  Medication Sig Dispense Refill  . acetaminophen (TYLENOL) 500 MG tablet Take 500 mg by mouth every 6 (six) hours as needed. 1-2 tablets        . aspirin buffered (BUFFERIN) 325 MG TABS tablet Take 325 mg by mouth daily.        Marland Kitchen esomeprazole (NEXIUM) 40 MG capsule Take 40 mg by mouth every other day.        . hydrochlorothiazide 25 MG tablet Take 25 mg by mouth daily.        . isosorbide dinitrate (ISORDIL) 10 MG tablet TAKE ONE TABLET BY MOUTH  THREE TIMES DAILY  90 tablet  6  . lisinopril (PRINIVIL,ZESTRIL) 20 MG tablet Take 20 mg by mouth daily.        . metoprolol (LOPRESSOR) 50 MG tablet Take 25 mg by mouth 2 (two) times daily.        . nitroGLYCERIN (NITROSTAT) 0.4 MG SL tablet Place 0.4 mg under the tongue every 5 (five) minutes as needed.        . Omega-3 Fatty Acids (FISH OIL CONCENTRATE) 1000 MG CAPS Take 1 capsule by mouth daily.        Marland Kitchen VYTORIN 10-80 MG per tablet TAKE ONE TABLET BY MOUTH EVERY DAY  90 each  3    Review of Systems  Constitutional: Negative.   HENT: Negative.   Eyes: Negative.   Respiratory: Negative.   Cardiovascular: Negative.   Gastrointestinal: Negative.   Musculoskeletal: Negative.   Skin: Negative.   Neurological: Negative.   Hematological: Negative.   Psychiatric/Behavioral: Negative.   All other systems reviewed and are negative.    BP 130/70  Pulse 54  Ht 5\' 9"  (1.753 m)  Wt 191 lb (86.637 kg)  BMI 28.21 kg/m2  Physical Exam  Nursing note and  vitals reviewed. Constitutional: He is oriented to person, place, and time. He appears well-developed and well-nourished.  HENT:  Head: Normocephalic.  Nose: Nose normal.  Mouth/Throat: Oropharynx is clear and moist.  Eyes: Conjunctivae are normal. Pupils are equal, round, and reactive to light.  Neck: Normal range of motion. Neck supple. No JVD present.  Cardiovascular: Normal rate, regular rhythm, S1 normal, S2 normal and intact distal pulses.  Exam reveals no gallop and no friction rub.   Murmur heard.  Crescendo systolic murmur is present with a grade of 2/6  Pulmonary/Chest: Effort normal and breath sounds normal. No respiratory distress. He has no wheezes. He has no rales. He exhibits no tenderness.  Abdominal: Soft. Bowel sounds are normal. He exhibits no distension. There is no tenderness.  Musculoskeletal: Normal range of motion. He exhibits no edema and no tenderness.  Lymphadenopathy:    He has no cervical adenopathy.    Neurological: He is alert and oriented to person, place, and time. Coordination normal.  Skin: Skin is warm and dry. No rash noted. No erythema.  Psychiatric: He has a normal mood and affect. His behavior is normal. Judgment and thought content normal.           Assessment and Plan

## 2012-03-14 ENCOUNTER — Other Ambulatory Visit (HOSPITAL_COMMUNITY): Payer: Self-pay | Admitting: Internal Medicine

## 2012-05-15 ENCOUNTER — Other Ambulatory Visit (HOSPITAL_COMMUNITY): Payer: Self-pay | Admitting: Internal Medicine

## 2012-08-21 ENCOUNTER — Ambulatory Visit: Payer: Medicare Other | Admitting: Cardiovascular Disease

## 2012-09-01 ENCOUNTER — Ambulatory Visit (INDEPENDENT_AMBULATORY_CARE_PROVIDER_SITE_OTHER): Payer: Medicare Other | Admitting: Cardiovascular Disease

## 2012-09-01 ENCOUNTER — Ambulatory Visit: Payer: Medicare Other | Admitting: Cardiovascular Disease

## 2012-09-01 ENCOUNTER — Encounter: Payer: Self-pay | Admitting: Cardiovascular Disease

## 2012-09-01 VITALS — BP 132/62 | HR 44 | Ht 69.0 in | Wt 200.0 lb

## 2012-09-01 DIAGNOSIS — E785 Hyperlipidemia, unspecified: Secondary | ICD-10-CM

## 2012-09-01 DIAGNOSIS — I10 Essential (primary) hypertension: Secondary | ICD-10-CM

## 2012-09-01 DIAGNOSIS — I359 Nonrheumatic aortic valve disorder, unspecified: Secondary | ICD-10-CM

## 2012-09-01 DIAGNOSIS — I25810 Atherosclerosis of coronary artery bypass graft(s) without angina pectoris: Secondary | ICD-10-CM

## 2012-09-01 DIAGNOSIS — I2510 Atherosclerotic heart disease of native coronary artery without angina pectoris: Secondary | ICD-10-CM

## 2012-09-01 MED ORDER — METOPROLOL TARTRATE 25 MG PO TABS: 25.0000 mg | ORAL_TABLET | Freq: Two times a day (BID) | ORAL | Status: DC

## 2012-09-01 MED ORDER — ATORVASTATIN CALCIUM 80 MG PO TABS: 80.0000 mg | ORAL_TABLET | Freq: Every day | ORAL | Status: DC

## 2012-09-01 NOTE — Assessment & Plan Note (Signed)
Most recent lipid panel not available. He is unable to afford Vytorin. We will change him to Lipitor 40 mg daily increasing to 80 mg daily after several weeks if tolerated. Recheck cholesterol in 3 months time. He reports that he will call his primary care physician to check his lab work there.

## 2012-09-01 NOTE — Assessment & Plan Note (Signed)
Currently with no symptoms of angina. No further workup at this time. Continue current medication regimen. 

## 2012-09-01 NOTE — Assessment & Plan Note (Signed)
Mild aortic valve stenosis by echocardiogram in 2010.

## 2012-09-01 NOTE — Progress Notes (Signed)
Patient ID: Matthew [name], male    DOB: [birth year], 76 y.o.   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 Also has h/o mild aortic stenosis by echocardiogram in 2010, HTN and hyperlipidemia.    June 2011 visits after experiencing chest pain and abdominal pain with diarrhea. He was referred for echo and stress test. Echo normal EF with very mild AS. Stress test showed no ischemia. Symptoms resolved with antacid. Also found to have gallstones and underwent cholecystectomy in July 2011.    He reports that he has been doing well. He is busy at baseline. He does Aeronautical engineer .Denies any significant chest pain or SOB. Taking all meds without problem. No syncope or presyncope.   The Vytorin is expensive for him and he would like to change.  EKG shows sinus bradycardia with rate of 44 beats per minute with no significant ST or T wave changes    Past Medical History: 1.         --Myoview 7/09 EF 54%, inferobasal infarct, no ischemia.         --Myoview. 6/10 EF 43% inferior wall infarct. no ischemia.        -- Echo 6/10: EF 55% mild AS mean 7 2.  Hyperlipidemia 3.  Hypertension 4.  Nephrolithiasis 5.  Aortic Stenosis, mild 6.  Carotid Bruits:  2009 0-39% on dopplers bilaterally. 7.  OSA    Outpatient Encounter Prescriptions as of 09/01/2012  Medication Sig Dispense Refill  . acetaminophen (TYLENOL) 500 MG tablet Take 500 mg by mouth every 6 (six) hours as needed. 1-2 tablets        . aspirin buffered (BUFFERIN) 325 MG TABS tablet Take 325 mg by mouth daily.        Marland Kitchen esomeprazole (NEXIUM) 40 MG capsule Take 40 mg by mouth every other day.        . hydrochlorothiazide (HYDRODIURIL) 25 MG tablet TAKE ONE TABLET BY MOUTH EVERY DAY  90 tablet  2  . isosorbide dinitrate (ISORDIL) 10 MG tablet Take 10 mg by mouth 2 (two) times daily.       Marland Kitchen lisinopril (PRINIVIL,ZESTRIL) 20 MG tablet TAKE ONE TABLET BY MOUTH EVERY DAY  90 tablet  2  .  metoprolol (LOPRESSOR) 25 MG tablet Take 1 tablet (25 mg total) by mouth 2 (two) times daily.  180 tablet  3  . nitroGLYCERIN (NITROSTAT) 0.4 MG SL tablet Place 0.4 mg under the tongue every 5 (five) minutes as needed.        . Omega-3 Fatty Acids (FISH OIL CONCENTRATE) 1000 MG CAPS Take 1 capsule by mouth daily.        Marland Kitchen DISCONTD: isosorbide dinitrate (ISORDIL) 10 MG tablet TAKE ONE TABLET BY MOUTH THREE TIMES DAILY  90 tablet  3  . DISCONTD: metoprolol (LOPRESSOR) 50 MG tablet TAKE ONE-HALF TABLET BY MOUTH TWICE DAILY  90 tablet  2  . DISCONTD: VYTORIN 10-80 MG per tablet TAKE ONE TABLET BY MOUTH EVERY DAY  30 each  6  . atorvastatin (LIPITOR) 80 MG tablet Take 1 tablet (80 mg total) by mouth daily.  90 tablet  3    Review of Systems  Constitutional: Negative.   HENT: Negative.   Eyes: Negative.   Respiratory: Negative.   Cardiovascular: Negative.   Gastrointestinal: Negative.   Musculoskeletal: Negative.   Skin: Negative.   Neurological: Negative.   Hematological: Negative.  Psychiatric/Behavioral: Negative.   All other systems reviewed and are negative.    BP 132/62  Pulse 44  Ht 5\' 9"  (1.753 m)  Wt 200 lb (90.719 kg)  BMI 29.53 kg/m2  Physical Exam  Nursing note and vitals reviewed. Constitutional: He is oriented to person, place, and time. He appears well-developed and well-nourished.  HENT:  Head: Normocephalic.  Nose: Nose normal.  Mouth/Throat: Oropharynx is clear and moist.  Eyes: Conjunctivae normal are normal. Pupils are equal, round, and reactive to light.  Neck: Normal range of motion. Neck supple. No JVD present.  Cardiovascular: Normal rate, regular rhythm, S1 normal, S2 normal and intact distal pulses.  Exam reveals no gallop and no friction rub.   Murmur heard.  Crescendo systolic murmur is present with a grade of 2/6  Pulmonary/Chest: Effort normal and breath sounds normal. No respiratory distress. He has no wheezes. He has no rales. He exhibits no  tenderness.  Abdominal: Soft. Bowel sounds are normal. He exhibits no distension. There is no tenderness.  Musculoskeletal: Normal range of motion. He exhibits no edema and no tenderness.  Lymphadenopathy:    He has no cervical adenopathy.  Neurological: He is alert and oriented to person, place, and time. Coordination normal.  Skin: Skin is warm and dry. No rash noted. No erythema.  Psychiatric: He has a normal mood and affect. His behavior is normal. Judgment and thought content normal.           Assessment and Plan

## 2012-09-01 NOTE — Assessment & Plan Note (Signed)
We will decrease the metoprolol to 12.5 mg twice a day given his bradycardia. Continue other medications.

## 2012-09-01 NOTE — Patient Instructions (Addendum)
You are doing well. Hold the vytorin Start the atorvastatin 1/2 a pill for the first few weeks before going to a full pill  We have decreased the metoprolol dose.  Please cut the 25 mg pill in 1/2 and take 1/2 pill in the am and pm.   Please call your primary care int hree months for a cholesterol check  Please call us if you have new issues that need to be addressed before your next appt.  Your physician wants you to follow-up in: 6 months.  You will receive a reminder letter in the mail two months in advance. If you don't receive a letter, please call our office to schedule the follow-up appointment.

## 2012-10-04 ENCOUNTER — Other Ambulatory Visit: Payer: Self-pay | Admitting: Cardiovascular Disease

## 2012-10-04 MED ORDER — ISOSORBIDE DINITRATE 10 MG PO TABS: 10.0000 mg | ORAL_TABLET | Freq: Three times a day (TID) | ORAL | Status: DC

## 2012-10-04 NOTE — Telephone Encounter (Signed)
Pt states that he is suppose to be taking 10 mg 3 times a day. Also wants 90 day called in

## 2012-10-04 NOTE — Telephone Encounter (Signed)
Refilled Isosorbide. 

## 2012-12-09 ENCOUNTER — Other Ambulatory Visit (HOSPITAL_COMMUNITY): Payer: Self-pay | Admitting: Cardiovascular Disease

## 2012-12-28 ENCOUNTER — Telehealth: Payer: Self-pay | Admitting: *Deleted

## 2012-12-28 NOTE — Telephone Encounter (Signed)
lmtcb

## 2012-12-28 NOTE — Telephone Encounter (Signed)
Pt had lab work done at PCP and was faxed to [place]. Calling for results

## 2012-12-28 NOTE — Telephone Encounter (Signed)
Reassured pt we did receive labs from PCP and results have improved compared to last cholesterol check in 2012 I reassured him Dr. Mariah Milling to review and we will call him if he needs to make any changes Understanding verb

## 2012-12-30 ENCOUNTER — Other Ambulatory Visit (HOSPITAL_COMMUNITY): Payer: Self-pay | Admitting: Cardiovascular Disease

## 2013-01-01 ENCOUNTER — Other Ambulatory Visit: Payer: Self-pay | Admitting: *Deleted

## 2013-01-01 MED ORDER — METOPROLOL TARTRATE 25 MG PO TABS: ORAL_TABLET | ORAL | Status: DC

## 2013-01-01 NOTE — Telephone Encounter (Signed)
Refilled Metoprolol

## 2013-01-16 ENCOUNTER — Other Ambulatory Visit: Payer: Self-pay | Admitting: *Deleted

## 2013-01-16 MED ORDER — LISINOPRIL 20 MG PO TABS: 20.0000 mg | ORAL_TABLET | Freq: Every day | ORAL | Status: DC

## 2013-01-16 MED ORDER — ATORVASTATIN CALCIUM 80 MG PO TABS: 80.0000 mg | ORAL_TABLET | Freq: Every day | ORAL | Status: DC

## 2013-06-25 ENCOUNTER — Other Ambulatory Visit (HOSPITAL_COMMUNITY): Payer: Self-pay | Admitting: Urology

## 2013-06-25 DIAGNOSIS — N23 Unspecified renal colic: Secondary | ICD-10-CM

## 2013-06-28 ENCOUNTER — Ambulatory Visit (HOSPITAL_COMMUNITY)
Admission: RE | Admit: 2013-06-28 | Discharge: 2013-06-28 | Disposition: A | Discharge status: Home / Self Care | Payer: Medicare Other | Source: Ambulatory Visit | Attending: Urology | Admitting: Urology

## 2013-06-28 DIAGNOSIS — N400 Enlarged prostate without lower urinary tract symptoms: Secondary | ICD-10-CM | POA: Insufficient documentation

## 2013-06-28 DIAGNOSIS — N200 Calculus of kidney: Secondary | ICD-10-CM | POA: Insufficient documentation

## 2013-06-28 DIAGNOSIS — R109 Unspecified abdominal pain: Secondary | ICD-10-CM | POA: Insufficient documentation

## 2013-06-28 DIAGNOSIS — N23 Unspecified renal colic: Secondary | ICD-10-CM

## 2013-07-12 ENCOUNTER — Other Ambulatory Visit (HOSPITAL_COMMUNITY): Payer: Self-pay | Admitting: Cardiovascular Disease

## 2013-07-12 ENCOUNTER — Other Ambulatory Visit: Payer: Self-pay | Admitting: *Deleted

## 2013-07-12 NOTE — Telephone Encounter (Signed)
lmtcb pt is overdue for 6 month f/u needs to contact office for future appointment.

## 2013-07-13 ENCOUNTER — Other Ambulatory Visit: Payer: Self-pay | Admitting: *Deleted

## 2013-07-13 MED ORDER — METOPROLOL TARTRATE 50 MG PO TABS: ORAL_TABLET | ORAL | Status: DC

## 2013-07-13 MED ORDER — METOPROLOL TARTRATE 25 MG PO TABS: ORAL_TABLET | ORAL | Status: DC

## 2013-07-13 NOTE — Telephone Encounter (Signed)
Refilled metoprolol sent to [facility] pharmacy.

## 2013-08-02 ENCOUNTER — Ambulatory Visit: Payer: Medicare Other | Admitting: Cardiovascular Disease

## 2013-08-17 ENCOUNTER — Ambulatory Visit (INDEPENDENT_AMBULATORY_CARE_PROVIDER_SITE_OTHER): Payer: Medicare Other | Admitting: Cardiovascular Disease

## 2013-08-17 ENCOUNTER — Encounter: Payer: Self-pay | Admitting: Cardiovascular Disease

## 2013-08-17 VITALS — BP 110/60 | HR 51 | Ht 69.0 in | Wt 196.0 lb

## 2013-08-17 DIAGNOSIS — E785 Hyperlipidemia, unspecified: Secondary | ICD-10-CM

## 2013-08-17 DIAGNOSIS — K219 Gastro-esophageal reflux disease without esophagitis: Secondary | ICD-10-CM

## 2013-08-17 DIAGNOSIS — I10 Essential (primary) hypertension: Secondary | ICD-10-CM

## 2013-08-17 DIAGNOSIS — I359 Nonrheumatic aortic valve disorder, unspecified: Secondary | ICD-10-CM

## 2013-08-17 DIAGNOSIS — I2510 Atherosclerotic heart disease of native coronary artery without angina pectoris: Secondary | ICD-10-CM

## 2013-08-17 MED ORDER — METOPROLOL TARTRATE 25 MG PO TABS: ORAL_TABLET | ORAL | Status: DC

## 2013-08-17 MED ORDER — OMEPRAZOLE 40 MG PO CPDR: 40.0000 mg | DELAYED_RELEASE_CAPSULE | Freq: Every day | ORAL | Status: DC

## 2013-08-17 NOTE — Assessment & Plan Note (Signed)
He has indicated his Nexium is expensive and would like to change. He is requesting omeprazole. We will provide a prescription for him.

## 2013-08-17 NOTE — Progress Notes (Signed)
Patient ID: Matthew [name], male    DOB: 07-May-1936, 77 y.o.   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  has h/o mild aortic stenosis by echocardiogram in 2010, HTN and hyperlipidemia.  In followup today, he reports that he is doing well. 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  Denies any significant chest pain or shortness of breath with exertion. Was previously on Vytorin, as this is expensive, he was changed to Lipitor. He reports that his heart rate has been low, he has had mild fatigue. He takes metoprolol 25 mg twice a day Heart rate on his last visit was 44    June 2011 visits  experienced chest pain and abdominal pain with diarrhea.  Echo normal EF with very mild AS.  Stress test showed no ischemia. Symptoms resolved with antacid. Also found to have gallstones and underwent cholecystectomy in July 2011.  Total cholesterol 151, HDL 40, LDL 81   EKG shows sinus bradycardia with rate 51 beats per minute, old inferior MI   Past Medical History: 1.         --Myoview 7/09 EF 54%, inferobasal infarct, no ischemia.         --Myoview. 6/10 EF 43% inferior wall infarct. no ischemia.        -- Echo 6/10: EF 55% mild AS mean 7 2.  Hyperlipidemia 3.  Hypertension 4.  Nephrolithiasis 5.  Aortic Stenosis, mild 6.  Carotid Bruits:  2009 0-39% on dopplers bilaterally. 7.  OSA    Outpatient Encounter Prescriptions as of 08/17/2013  Medication Sig Dispense Refill  . acetaminophen (TYLENOL) 500 MG tablet Take 500 mg by mouth every 6 (six) hours as needed. 1-2 tablets        . aspirin buffered (BUFFERIN) 325 MG TABS tablet Take 325 mg by mouth daily.        Marland Kitchen atorvastatin (LIPITOR) 80 MG tablet Take 1 tablet (80 mg total) by mouth daily.  90 tablet  3  . esomeprazole (NEXIUM) 40 MG capsule Take 40 mg by mouth every other day.        . hydrochlorothiazide (HYDRODIURIL) 25 MG tablet  TAKE ONE TABLET BY MOUTH EVERY DAY  90 tablet  3  . isosorbide dinitrate (ISORDIL) 10 MG tablet Take 1 tablet (10 mg total) by mouth 3 (three) times daily.  270 tablet  3  . lisinopril (PRINIVIL,ZESTRIL) 20 MG tablet Take 1 tablet (20 mg total) by mouth daily.  90 tablet  2  . metoprolol (LOPRESSOR) 50 MG tablet TAKE ONE-HALF TABLET BY MOUTH TWICE DAILY  90 tablet  1  . nitroGLYCERIN (NITROSTAT) 0.4 MG SL tablet Place 0.4 mg under the tongue every 5 (five) minutes as needed.        . Omega-3 Fatty Acids (FISH OIL CONCENTRATE) 1000 MG CAPS Take 1 capsule by mouth daily.         No facility-administered encounter medications on file as of 08/17/2013.    Review of Systems  Constitutional: Positive for fatigue.  HENT: Negative.   Eyes: Negative.   Respiratory: Negative.   Cardiovascular: Negative.   Gastrointestinal: Negative.   Musculoskeletal: Negative.   Skin: Negative.   Neurological: Negative.   Psychiatric/Behavioral: Negative.   All other systems reviewed and are negative.    BP 110/60  Pulse 51  Ht 5\' 9"  (1.753  m)  Wt 196 lb (88.905 kg)  BMI 28.93 kg/m2  Physical Exam  Nursing note and vitals reviewed. Constitutional: He is oriented to person, place, and time. He appears well-developed and well-nourished.  HENT:  Head: Normocephalic.  Nose: Nose normal.  Mouth/Throat: Oropharynx is clear and moist.  Eyes: Conjunctivae are normal. Pupils are equal, round, and reactive to light.  Neck: Normal range of motion. Neck supple. No JVD present.  Cardiovascular: Normal rate, regular rhythm, S1 normal, S2 normal and intact distal pulses.  Exam reveals no gallop and no friction rub.   Murmur heard.  Crescendo systolic murmur is present with a grade of 2/6  Pulmonary/Chest: Effort normal and breath sounds normal. No respiratory distress. He has no wheezes. He has no rales. He exhibits no tenderness.  Abdominal: Soft. Bowel sounds are normal. He exhibits no distension. There is no  tenderness.  Musculoskeletal: Normal range of motion. He exhibits no edema and no tenderness.  Lymphadenopathy:    He has no cervical adenopathy.  Neurological: He is alert and oriented to person, place, and time. Coordination normal.  Skin: Skin is warm and dry. No rash noted. No erythema.  Psychiatric: He has a normal mood and affect. His behavior is normal. Judgment and thought content normal.      Assessment and Plan

## 2013-08-17 NOTE — Assessment & Plan Note (Signed)
Currently with no symptoms of angina. No further workup at this time. Continue current medication regimen. 

## 2013-08-17 NOTE — Assessment & Plan Note (Signed)
Mild aortic valve stenosis. Repeat echocardiogram if murmur gets appreciably louder.

## 2013-08-17 NOTE — Assessment & Plan Note (Signed)
Blood pressure well controlled. We will decrease the metoprolol down to 12.5 mg twice a day

## 2013-08-17 NOTE — Assessment & Plan Note (Signed)
Cholesterol is at goal on the current lipid regimen. No changes to the medications were made.  

## 2013-08-17 NOTE — Patient Instructions (Addendum)
You are doing well.  Please decrease the metoprolol to 12.5 mg twice a day (cut the 25 mg pill in 1/2)  Hold the nexium when you run out Start omeprazole for heart burn, one a day  Please call us if you have new issues that need to be addressed before your next appt.  Your physician wants you to follow-up in: 6 months.  You will receive a reminder letter in the mail two months in advance. If you don't receive a letter, please call our office to schedule the follow-up appointment.

## 2013-10-27 ENCOUNTER — Other Ambulatory Visit: Payer: Self-pay | Admitting: Cardiovascular Disease

## 2013-10-29 ENCOUNTER — Other Ambulatory Visit: Payer: Self-pay

## 2013-10-29 ENCOUNTER — Other Ambulatory Visit: Payer: Self-pay | Admitting: *Deleted

## 2013-10-29 MED ORDER — LISINOPRIL 20 MG PO TABS: 20.0000 mg | ORAL_TABLET | Freq: Every day | ORAL | Status: DC

## 2013-10-29 MED ORDER — HYDROCHLOROTHIAZIDE 25 MG PO TABS: ORAL_TABLET | ORAL | Status: DC

## 2013-10-29 MED ORDER — ATORVASTATIN CALCIUM 80 MG PO TABS: 80.0000 mg | ORAL_TABLET | Freq: Every day | ORAL | Status: DC

## 2013-10-29 NOTE — Telephone Encounter (Signed)
Requested Prescriptions   Signed Prescriptions Disp Refills  . lisinopril (PRINIVIL,ZESTRIL) 20 MG tablet 90 tablet 3    Sig: Take 1 tablet (20 mg total) by mouth daily.    Authorizing Provider: GOLLAN, TIMOTHY J    Ordering User: LOPEZ, MARINA C    

## 2013-10-29 NOTE — Telephone Encounter (Signed)
Requested Prescriptions   Signed Prescriptions Disp Refills  . lisinopril (PRINIVIL,ZESTRIL) 20 MG tablet 90 tablet 3    Sig: Take 1 tablet (20 mg total) by mouth daily.    Authorizing Provider: Antonieta Iba    Ordering User: Shawnie Dapper, MARINA C  . hydrochlorothiazide (HYDRODIURIL) 25 MG tablet 90 tablet 3    Sig: TAKE ONE TABLET BY MOUTH EVERY DAY    Authorizing Provider: Antonieta Iba    Ordering User: LOPEZ, MARINA C  . atorvastatin (LIPITOR) 80 MG tablet 90 tablet 3    Sig: Take 1 tablet (80 mg total) by mouth daily.    Authorizing Provider: Antonieta Iba    Ordering User: Kendrick Fries

## 2013-11-05 ENCOUNTER — Other Ambulatory Visit: Payer: Self-pay | Admitting: *Deleted

## 2013-11-05 MED ORDER — ATORVASTATIN CALCIUM 80 MG PO TABS: 80.0000 mg | ORAL_TABLET | Freq: Every day | ORAL | Status: DC

## 2013-11-05 NOTE — Telephone Encounter (Signed)
Requested Prescriptions     Pending Prescriptions Disp Refills   • atorvastatin (LIPITOR) 80 MG tablet 90 tablet 3     Sig: Take 1 tablet (80 mg total) by mouth daily

## 2013-12-10 ENCOUNTER — Other Ambulatory Visit: Payer: Self-pay | Admitting: Cardiovascular Disease

## 2013-12-11 ENCOUNTER — Other Ambulatory Visit: Payer: Self-pay | Admitting: *Deleted

## 2013-12-11 ENCOUNTER — Other Ambulatory Visit: Payer: Self-pay | Admitting: Cardiovascular Disease

## 2013-12-11 MED ORDER — ISOSORBIDE DINITRATE 10 MG PO TABS: 10.0000 mg | ORAL_TABLET | Freq: Three times a day (TID) | ORAL | Status: DC

## 2013-12-11 NOTE — Telephone Encounter (Signed)
Requested Prescriptions   Signed Prescriptions Disp Refills  . isosorbide dinitrate (ISORDIL) 10 MG tablet 270 tablet 3    Sig: Take 1 tablet (10 mg total) by mouth 3 (three) times daily.    Authorizing Provider: Antonieta Iba    Ordering User: Kendrick Fries

## 2013-12-31 ENCOUNTER — Other Ambulatory Visit: Payer: Self-pay | Admitting: *Deleted

## 2014-02-10 DIAGNOSIS — I739 Peripheral vascular disease, unspecified: Secondary | ICD-10-CM

## 2014-02-10 HISTORY — DX: Peripheral vascular disease, unspecified: I73.9

## 2014-02-10 HISTORY — PX: CARDIAC CATHETERIZATION: SHX172

## 2014-02-14 ENCOUNTER — Ambulatory Visit (INDEPENDENT_AMBULATORY_CARE_PROVIDER_SITE_OTHER): Payer: Medicare Other | Admitting: Cardiovascular Disease

## 2014-02-14 ENCOUNTER — Ambulatory Visit: Payer: Self-pay | Admitting: Cardiovascular Disease

## 2014-02-14 ENCOUNTER — Encounter: Payer: Self-pay | Admitting: Cardiovascular Disease

## 2014-02-14 VITALS — BP 140/72 | HR 63 | Ht 69.0 in | Wt 199.8 lb

## 2014-02-14 DIAGNOSIS — I208 Other forms of angina pectoris: Secondary | ICD-10-CM | POA: Insufficient documentation

## 2014-02-14 DIAGNOSIS — I209 Angina pectoris, unspecified: Secondary | ICD-10-CM

## 2014-02-14 DIAGNOSIS — I2510 Atherosclerotic heart disease of native coronary artery without angina pectoris: Secondary | ICD-10-CM

## 2014-02-14 DIAGNOSIS — I2089 Other forms of angina pectoris: Secondary | ICD-10-CM | POA: Insufficient documentation

## 2014-02-14 DIAGNOSIS — I359 Nonrheumatic aortic valve disorder, unspecified: Secondary | ICD-10-CM

## 2014-02-14 DIAGNOSIS — E785 Hyperlipidemia, unspecified: Secondary | ICD-10-CM

## 2014-02-14 DIAGNOSIS — R079 Chest pain, unspecified: Secondary | ICD-10-CM

## 2014-02-14 DIAGNOSIS — I10 Essential (primary) hypertension: Secondary | ICD-10-CM

## 2014-02-14 MED ORDER — NITROGLYCERIN 0.4 MG SL SUBL: 0.4000 mg | SUBLINGUAL_TABLET | SUBLINGUAL | Status: DC | PRN

## 2014-02-14 NOTE — Patient Instructions (Addendum)
[facility] Cardiac Cath Instructions   You are scheduled for a Cardiac Cath on:___Thursday, March 19___________  Please arrive at __6:30am___am on the day of your procedure  You will need to pre-register prior to the day of your procedure.  Enter through the [employer] at [facility].  Registration is the first desk on your right.  Please take the procedure order we have given you in order to be registered appropriately  Do not eat/drink anything after midnight  Someone will need to drive you home  It is recommended someone be with you for the first 24 hours after your procedure  Wear clothes that are easy to get on/off and wear slip on shoes if possible   Medications bring a current list of all medications with you  Day of your procedure: Arrive at the [place] entrance.  Free valet service is available.  After entering the [facility] please check-in at the registration desk (1st desk on your right) to receive your armband. After receiving your armband someone will escort you to the cardiac cath/special procedures waiting area.  The usual length of stay after your procedure is about 2 to 3 hours.  This can vary.  If you have any questions, please call our office at [phone], or you may call the cardiac cath lab at [facility] directly at [phone]

## 2014-02-14 NOTE — Assessment & Plan Note (Signed)
Encouraged him to stay on his statin. We'll try to obtain his most recent lipid panel

## 2014-02-14 NOTE — Progress Notes (Signed)
Patient ID: Matthew [name], male    DOB: 1936/08/24, 78 y.o.   MRN: [id number]  HPI Comments: Matthew [name] is a 78 yo with h/o CAD, MI in 1996,  s/p CABG 1996 with redo 2004.  has h/o mild aortic stenosis by echocardiogram in 2010, HTN and hyperlipidemia.  In followup today, he reports that he has been having chest pain symptoms with exertion. It has been going on for 6 months, getting worse. Typically when he has the symptoms he is working in his yard. He has to sit down, take an aspirin and symptoms resolved. Denies having any significant symptoms at rest or with minimal exertion. He has not been taking nitroglycerin. This is outdated and he reports that aspirin works okay. Symptoms are consistent with prior angina though not as severe as prior to his bypass surgery. Also symptoms of shortness of breath with exertion  He is scheduled for cataract surgery on March 24 preop eval possibly on March 14    Echo normal EF with very mild AS in 2010 History of gallstones and underwent cholecystectomy in July 2011.  Prior lab work; Total cholesterol 151, HDL 40, LDL 81   EKG shows normal sinus rhythm , old inferior MI   Past Medical History: 1.         --Myoview 7/09 EF 54%, inferobasal infarct, no ischemia.         --Myoview. 6/10 EF 43% inferior wall infarct. no ischemia.        -- Echo 6/10: EF 55% mild AS mean 7 2.  Hyperlipidemia 3.  Hypertension 4.  Nephrolithiasis 5.  Aortic Stenosis, mild 6.  Carotid Bruits:  2009 0-39% on dopplers bilaterally. 7.  OSA    Outpatient Encounter Prescriptions as of 02/14/2014  Medication Sig  . acetaminophen (TYLENOL) 500 MG tablet Take 500 mg by mouth every 6 (six) hours as needed. 1-2 tablets    . aspirin buffered (BUFFERIN) 325 MG TABS tablet Take 325 mg by mouth daily.    Marland Kitchen atorvastatin (LIPITOR) 80 MG tablet Take 1 tablet (80 mg total) by mouth daily.  . hydrochlorothiazide (HYDRODIURIL) 25 MG tablet TAKE ONE TABLET BY MOUTH EVERY DAY  . isosorbide  dinitrate (ISORDIL) 10 MG tablet TAKE ONE TABLET BY MOUTH THREE TIMES DAILY  . lisinopril (PRINIVIL,ZESTRIL) 20 MG tablet TAKE ONE TABLET BY MOUTH EVERY DAY  . metoprolol (LOPRESSOR) 25 MG tablet TAKE ONE-HALF TABLET BY MOUTH TWICE DAILY  . nitroGLYCERIN (NITROSTAT) 0.4 MG SL tablet Place 1 tablet (0.4 mg total) under the tongue every 5 (five) minutes as needed.  Marland Kitchen omeprazole (PRILOSEC) 40 MG capsule Take 1 capsule (40 mg total) by mouth daily.    Review of Systems  HENT: Negative.   Eyes: Negative.   Respiratory: Positive for chest tightness and shortness of breath.   Cardiovascular: Positive for chest pain.  Gastrointestinal: Negative.   Musculoskeletal: Negative.   Skin: Negative.   Neurological: Negative.   Psychiatric/Behavioral: Negative.   All other systems reviewed and are negative.    BP 140/72  Pulse 63  Ht 5\' 9"  (1.753 m)  Wt 199 lb 12 oz (90.606 kg)  BMI 29.48 kg/m2  Physical Exam  Nursing note and vitals reviewed. Constitutional: He is oriented to person, place, and time. He appears well-developed and well-nourished.  HENT:  Head: Normocephalic.  Nose: Nose normal.  Mouth/Throat: Oropharynx is clear and moist.  Eyes: Conjunctivae are normal. Pupils are equal, round, and reactive to light.  Neck: Normal range  of motion. Neck supple. No JVD present.  Cardiovascular: Normal rate, regular rhythm, S1 normal, S2 normal and intact distal pulses.  Exam reveals no gallop and no friction rub.   Murmur heard.  Crescendo systolic murmur is present with a grade of 2/6  Pulmonary/Chest: Effort normal and breath sounds normal. No respiratory distress. He has no wheezes. He has no rales. He exhibits no tenderness.  Abdominal: Soft. Bowel sounds are normal. He exhibits no distension. There is no tenderness.  Musculoskeletal: Normal range of motion. He exhibits no edema and no tenderness.  Lymphadenopathy:    He has no cervical adenopathy.  Neurological: He is alert and  oriented to person, place, and time. Coordination normal.  Skin: Skin is warm and dry. No rash noted. No erythema.  Psychiatric: He has a normal mood and affect. His behavior is normal. Judgment and thought content normal.      Assessment and Plan

## 2014-02-14 NOTE — Assessment & Plan Note (Signed)
Very mild aortic valve stenosis in 2010. We'll try to evaluate aortic valve stenosis on cardiac cath

## 2014-02-14 NOTE — Assessment & Plan Note (Signed)
Blood pressure is well controlled on today's visit. No changes made to the medications. 

## 2014-02-14 NOTE — Assessment & Plan Note (Signed)
Known severe CAD with CABG, redo. Now with angina. We'll schedule him for cardiac catheterization. Encouraged him to take nitroglycerin for chest pain

## 2014-02-14 NOTE — Assessment & Plan Note (Addendum)
Worsening chest pain with exertion concerning for angina, ischemia. After much discussion about his various options, we will schedule him for a cardiac catheterization. Risks and benefits were discussed with him. He would like to proceed with the   procedure. Percentage of stenosis in each vessel if known: History of CABG CABG with redo in 2004  Ejection fraction from stress test or echo within the past 6 months: No recent echocardiogram  Result of any study/stress testing in the prior 6 months: No recent stress test  Anginal status Including stable, unstable, non-STEMI, or anginal equivalent symptoms: Stable angina  Anginal CCS of 1 through 4: 3  ACS, yes or no:  No  NYHA classification if CHF history or in CHF: No CHF

## 2014-02-15 ENCOUNTER — Other Ambulatory Visit: Payer: Self-pay

## 2014-02-15 DIAGNOSIS — I10 Essential (primary) hypertension: Secondary | ICD-10-CM

## 2014-02-15 DIAGNOSIS — R079 Chest pain, unspecified: Secondary | ICD-10-CM

## 2014-02-15 DIAGNOSIS — I2510 Atherosclerotic heart disease of native coronary artery without angina pectoris: Secondary | ICD-10-CM

## 2014-02-15 LAB — CBC WITH DIFFERENTIAL
BASOS ABS: 0.1 10*3/uL (ref 0.0–0.2)
Basos: 1 %
EOS ABS: 0.1 10*3/uL (ref 0.0–0.4)
Eos: 1 %
HEMATOCRIT: 40.6 % (ref 37.5–51.0)
Hemoglobin: 13.9 g/dL (ref 12.6–17.7)
IMMATURE GRANULOCYTES: 0 %
Immature Grans (Abs): 0 10*3/uL (ref 0.0–0.1)
LYMPHS ABS: 1.7 10*3/uL (ref 0.7–3.1)
Lymphs: 26 %
MCH: 29.5 pg (ref 26.6–33.0)
MCHC: 34.2 g/dL (ref 31.5–35.7)
MCV: 86 fL (ref 79–97)
MONOCYTES: 12 %
Monocytes Absolute: 0.8 10*3/uL (ref 0.1–0.9)
NEUTROS ABS: 3.8 10*3/uL (ref 1.4–7.0)
Neutrophils Relative %: 60 %
PLATELETS: 177 10*3/uL (ref 150–379)
RBC: 4.71 x10E6/uL (ref 4.14–5.80)
RDW: 14 % (ref 12.3–15.4)
WBC: 6.5 10*3/uL (ref 3.4–10.8)

## 2014-02-15 LAB — BASIC METABOLIC PANEL
BUN / CREAT RATIO: 12 (ref 10–22)
BUN: 14 mg/dL (ref 8–27)
CHLORIDE: 97 mmol/L (ref 97–108)
CO2: 25 mmol/L (ref 18–29)
Calcium: 9.2 mg/dL (ref 8.6–10.2)
Creatinine, Ser: 1.17 mg/dL (ref 0.76–1.27)
GFR calc Af Amer: 70 mL/min/{1.73_m2} (ref >59)
GFR calc non Af Amer: 61 mL/min/{1.73_m2} (ref >59)
Glucose: 105 mg/dL — ABNORMAL HIGH (ref 65–99)
POTASSIUM: 4 mmol/L (ref 3.5–5.2)
Sodium: 140 mmol/L (ref 134–144)

## 2014-02-15 LAB — PROTIME-INR
INR: 1.1 (ref 0.8–1.2)
Prothrombin Time: 11 s (ref 9.1–12.0)

## 2014-02-21 ENCOUNTER — Ambulatory Visit: Payer: Self-pay | Admitting: Ophthalmology

## 2014-02-21 LAB — POTASSIUM: Potassium: 3.5 mmol/L (ref 3.5–5.1)

## 2014-02-28 ENCOUNTER — Telehealth: Payer: Self-pay

## 2014-02-28 ENCOUNTER — Ambulatory Visit: Payer: Self-pay | Admitting: Cardiovascular Disease

## 2014-02-28 ENCOUNTER — Other Ambulatory Visit: Payer: Self-pay

## 2014-02-28 DIAGNOSIS — I2510 Atherosclerotic heart disease of native coronary artery without angina pectoris: Secondary | ICD-10-CM

## 2014-02-28 DIAGNOSIS — I10 Essential (primary) hypertension: Secondary | ICD-10-CM

## 2014-02-28 DIAGNOSIS — I739 Peripheral vascular disease, unspecified: Secondary | ICD-10-CM

## 2014-02-28 DIAGNOSIS — I70209 Unspecified atherosclerosis of native arteries of extremities, unspecified extremity: Secondary | ICD-10-CM

## 2014-02-28 DIAGNOSIS — I208 Other forms of angina pectoris: Secondary | ICD-10-CM

## 2014-02-28 DIAGNOSIS — E785 Hyperlipidemia, unspecified: Secondary | ICD-10-CM

## 2014-02-28 DIAGNOSIS — Z01812 Encounter for preprocedural laboratory examination: Secondary | ICD-10-CM

## 2014-02-28 DIAGNOSIS — I25810 Atherosclerosis of coronary artery bypass graft(s) without angina pectoris: Secondary | ICD-10-CM

## 2014-02-28 NOTE — Telephone Encounter (Signed)
Cardiac cath w/ Dr. Fletcher Anon sched for 03/06/14 at [facility]. Left message for pt to call back.

## 2014-03-01 ENCOUNTER — Ambulatory Visit (INDEPENDENT_AMBULATORY_CARE_PROVIDER_SITE_OTHER): Payer: Medicare Other | Admitting: *Deleted

## 2014-03-01 ENCOUNTER — Telehealth: Payer: Self-pay

## 2014-03-01 DIAGNOSIS — I208 Other forms of angina pectoris: Secondary | ICD-10-CM

## 2014-03-01 DIAGNOSIS — I739 Peripheral vascular disease, unspecified: Secondary | ICD-10-CM

## 2014-03-01 DIAGNOSIS — I25810 Atherosclerosis of coronary artery bypass graft(s) without angina pectoris: Secondary | ICD-10-CM

## 2014-03-01 NOTE — Telephone Encounter (Signed)
Notified [facility] that, per Dr. Rockey Situ, pt will need to delay his upcoming cataract surgery. They ask will have pt reschedule this when he is ready.

## 2014-03-01 NOTE — Telephone Encounter (Signed)
Spoke w/ pt.  Notified him of date and time of procedure. He is to come in today @ 2:00 for pre-cath lab draw and to go over instructions.

## 2014-03-02 LAB — BASIC METABOLIC PANEL
BUN/Creatinine Ratio: 15 (ref 10–22)
BUN: 15 mg/dL (ref 8–27)
CALCIUM: 9.1 mg/dL (ref 8.6–10.2)
CO2: 22 mmol/L (ref 18–29)
CREATININE: 0.99 mg/dL (ref 0.76–1.27)
Chloride: 101 mmol/L (ref 97–108)
GFR calc Af Amer: 86 mL/min/{1.73_m2} (ref >59)
GFR calc non Af Amer: 74 mL/min/{1.73_m2} (ref >59)
Glucose: 61 mg/dL — ABNORMAL LOW (ref 65–99)
Potassium: 3.8 mmol/L (ref 3.5–5.2)
SODIUM: 141 mmol/L (ref 134–144)

## 2014-03-02 LAB — CBC WITH DIFFERENTIAL
Basophils Absolute: 0.1 10*3/uL (ref 0.0–0.2)
Basos: 1 %
EOS ABS: 0.1 10*3/uL (ref 0.0–0.4)
Eos: 1 %
HCT: 39.7 % (ref 37.5–51.0)
HEMOGLOBIN: 13.4 g/dL (ref 12.6–17.7)
Immature Grans (Abs): 0 10*3/uL (ref 0.0–0.1)
Immature Granulocytes: 0 %
LYMPHS ABS: 1.8 10*3/uL (ref 0.7–3.1)
LYMPHS: 28 %
MCH: 30.2 pg (ref 26.6–33.0)
MCHC: 33.8 g/dL (ref 31.5–35.7)
MCV: 90 fL (ref 79–97)
Monocytes Absolute: 0.8 10*3/uL (ref 0.1–0.9)
Monocytes: 12 %
NEUTROS ABS: 3.8 10*3/uL (ref 1.4–7.0)
Neutrophils Relative %: 58 %
Platelets: 147 10*3/uL — ABNORMAL LOW (ref 150–379)
RBC: 4.43 x10E6/uL (ref 4.14–5.80)
RDW: 14.1 % (ref 12.3–15.4)
WBC: 6.5 10*3/uL (ref 3.4–10.8)

## 2014-03-02 LAB — PROTIME-INR
INR: 1 (ref 0.8–1.2)
PROTHROMBIN TIME: 10.5 s (ref 9.1–12.0)

## 2014-03-04 ENCOUNTER — Encounter (HOSPITAL_COMMUNITY): Payer: Self-pay | Admitting: Pharmacy Technician

## 2014-03-05 ENCOUNTER — Telehealth: Payer: Self-pay | Admitting: Cardiovascular Disease

## 2014-03-05 ENCOUNTER — Other Ambulatory Visit: Payer: Self-pay | Admitting: Cardiovascular Disease

## 2014-03-05 DIAGNOSIS — I739 Peripheral vascular disease, unspecified: Secondary | ICD-10-CM

## 2014-03-05 DIAGNOSIS — I25810 Atherosclerosis of coronary artery bypass graft(s) without angina pectoris: Secondary | ICD-10-CM

## 2014-03-05 NOTE — Telephone Encounter (Signed)
New message:  Short stay is requesting orders from Dr. Fletcher Anon prior to procedure.

## 2014-03-05 NOTE — Telephone Encounter (Signed)
Dr. Fletcher Anon is entering orders now.

## 2014-03-06 ENCOUNTER — Ambulatory Visit (HOSPITAL_COMMUNITY)
Admission: RE | Admit: 2014-03-06 | Discharge: 2014-03-06 | Disposition: A | Discharge status: Home / Self Care | Payer: Medicare Other | Source: Ambulatory Visit | Attending: Cardiovascular Disease | Admitting: Cardiovascular Disease

## 2014-03-06 ENCOUNTER — Encounter (HOSPITAL_COMMUNITY)
Admission: RE | Disposition: A | Discharge status: Home / Self Care | Payer: Self-pay | Source: Ambulatory Visit | Attending: Cardiovascular Disease

## 2014-03-06 ENCOUNTER — Other Ambulatory Visit: Payer: Self-pay | Admitting: Cardiovascular Disease

## 2014-03-06 DIAGNOSIS — I10 Essential (primary) hypertension: Secondary | ICD-10-CM | POA: Insufficient documentation

## 2014-03-06 DIAGNOSIS — Z7982 Long term (current) use of aspirin: Secondary | ICD-10-CM | POA: Insufficient documentation

## 2014-03-06 DIAGNOSIS — H269 Unspecified cataract: Secondary | ICD-10-CM | POA: Insufficient documentation

## 2014-03-06 DIAGNOSIS — E785 Hyperlipidemia, unspecified: Secondary | ICD-10-CM | POA: Insufficient documentation

## 2014-03-06 DIAGNOSIS — I2510 Atherosclerotic heart disease of native coronary artery without angina pectoris: Secondary | ICD-10-CM

## 2014-03-06 DIAGNOSIS — I739 Peripheral vascular disease, unspecified: Secondary | ICD-10-CM

## 2014-03-06 DIAGNOSIS — R0989 Other specified symptoms and signs involving the circulatory and respiratory systems: Secondary | ICD-10-CM | POA: Insufficient documentation

## 2014-03-06 DIAGNOSIS — I252 Old myocardial infarction: Secondary | ICD-10-CM | POA: Insufficient documentation

## 2014-03-06 DIAGNOSIS — I359 Nonrheumatic aortic valve disorder, unspecified: Secondary | ICD-10-CM | POA: Insufficient documentation

## 2014-03-06 DIAGNOSIS — I209 Angina pectoris, unspecified: Secondary | ICD-10-CM | POA: Insufficient documentation

## 2014-03-06 DIAGNOSIS — I25810 Atherosclerosis of coronary artery bypass graft(s) without angina pectoris: Secondary | ICD-10-CM

## 2014-03-06 DIAGNOSIS — G4733 Obstructive sleep apnea (adult) (pediatric): Secondary | ICD-10-CM | POA: Insufficient documentation

## 2014-03-06 DIAGNOSIS — I70219 Atherosclerosis of native arteries of extremities with intermittent claudication, unspecified extremity: Secondary | ICD-10-CM

## 2014-03-06 HISTORY — PX: ABDOMINAL AORTAGRAM: SHX5454

## 2014-03-06 HISTORY — PX: LEFT HEART CATHETERIZATION WITH CORONARY/GRAFT ANGIOGRAM: SHX5450

## 2014-03-06 LAB — POCT ACTIVATED CLOTTING TIME
Activated Clotting Time: 205 seconds
Activated Clotting Time: 199 seconds

## 2014-03-06 SURGERY — LEFT HEART CATHETERIZATION WITH CORONARY/GRAFT ANGIOGRAM
Anesthesia: LOCAL

## 2014-03-06 MED ORDER — SODIUM CHLORIDE 0.9 % IJ SOLN: 3.0000 mL | Freq: Two times a day (BID) | INTRAMUSCULAR | Status: DC

## 2014-03-06 MED ORDER — SODIUM CHLORIDE 0.9 % IJ SOLN: 3.0000 mL | INTRAMUSCULAR | Status: DC | PRN

## 2014-03-06 MED ORDER — HEPARIN SODIUM (PORCINE) 1000 UNIT/ML IJ SOLN
INTRAMUSCULAR | Status: AC
  Filled 2014-03-06: qty 1

## 2014-03-06 MED ORDER — SODIUM CHLORIDE 0.9 % IV SOLN
INTRAVENOUS | Status: DC
  Administered 2014-03-06: 10:00:00 via INTRAVENOUS

## 2014-03-06 MED ORDER — SODIUM CHLORIDE 0.9 % IV SOLN: 250.0000 mL | INTRAVENOUS | Status: DC | PRN

## 2014-03-06 MED ORDER — ACETAMINOPHEN 325 MG PO TABS
650.0000 mg | ORAL_TABLET | Freq: Once | ORAL | Status: DC
  Filled 2014-03-06 (×2): qty 2

## 2014-03-06 MED ORDER — SODIUM CHLORIDE 0.9 % IV SOLN: INTRAVENOUS | Status: DC

## 2014-03-06 MED ORDER — CLOPIDOGREL BISULFATE 300 MG PO TABS
ORAL_TABLET | ORAL | Status: AC
  Filled 2014-03-06: qty 1

## 2014-03-06 MED ORDER — ASPIRIN 81 MG PO CHEW
81.0000 mg | CHEWABLE_TABLET | ORAL | Status: AC
  Administered 2014-03-06: 81 mg via ORAL

## 2014-03-06 MED ORDER — NITROGLYCERIN 0.2 MG/ML ON CALL CATH LAB
INTRAVENOUS | Status: AC
  Filled 2014-03-06: qty 1

## 2014-03-06 MED ORDER — ASPIRIN 81 MG PO CHEW
CHEWABLE_TABLET | ORAL | Status: AC
  Filled 2014-03-06: qty 1

## 2014-03-06 MED ORDER — LIDOCAINE HCL (PF) 1 % IJ SOLN
INTRAMUSCULAR | Status: AC
  Filled 2014-03-06: qty 30

## 2014-03-06 MED ORDER — MIDAZOLAM HCL 2 MG/2ML IJ SOLN
INTRAMUSCULAR | Status: AC
  Filled 2014-03-06: qty 2

## 2014-03-06 MED ORDER — HEPARIN (PORCINE) IN NACL 2-0.9 UNIT/ML-% IJ SOLN
INTRAMUSCULAR | Status: AC
  Filled 2014-03-06: qty 1000

## 2014-03-06 MED ORDER — CLOPIDOGREL BISULFATE 75 MG PO TABS: 75.0000 mg | ORAL_TABLET | Freq: Every day | ORAL | Status: DC

## 2014-03-06 MED ORDER — FENTANYL CITRATE 0.05 MG/ML IJ SOLN
INTRAMUSCULAR | Status: AC
  Filled 2014-03-06: qty 2

## 2014-03-06 NOTE — Interval H&P Note (Signed)
History and Physical Interval Note: Cardiac cath was attempted last week at [facility] initially by Dr. Rockey Situ. Access was obtained via the right common femoral artery. However, the common iliac artery was found to be occluded with an inability to advance diagnostic catheters. I attempted cardiac catheterization via the right radial artery. However, the innominate artery was severely tortuous and calcified with inability to finish cardiac catheterization. I was able to engage the SVG 2 left circumflex and RIMA to diagonal. Both of them were patent. An abdominal aortogram was performed which showed an occluded right common iliac artery and significant disease in the left common iliac artery. The patient has been having class III angina in spite of medical therapy as well as severe lower back claudication. The plan is to proceed with catheterization via the left femoral artery with possible PCI as well as revascularization of his iliac disease.  03/06/2014 Cath Lab Visit (complete for each Cath Lab visit)  Clinical Evaluation Leading to the Procedure:   ACS: no  Non-ACS:    Anginal Classification: CCS III  Anti-ischemic medical therapy: Maximal Therapy (2 or more classes of medications)  Non-Invasive Test Results: No non-invasive testing performed  Prior CABG: Previous CABG       9:59 AM  Matthew [name]  has presented today for surgery, with the diagnosis of Angina/PAD  The various methods of treatment have been discussed with the patient and family. After consideration of risks, benefits and other options for treatment, the patient has consented to  Procedure(s): LEFT HEART CATHETERIZATION WITH CORONARY/GRAFT ANGIOGRAM (N/A) ABDOMINAL AORTAGRAM (N/A) as a surgical intervention .  The patient's history has been reviewed, patient examined, no change in status, stable for surgery.  I have reviewed the patient's chart and labs.  Questions were answered to the patient's satisfaction.     [name]

## 2014-03-06 NOTE — Discharge Instructions (Signed)
Start Plavix 75 mg once daily for 1 month (a prescription was send to [facility])   Angiography, Care After Refer to this sheet in the next few weeks. These instructions provide you with information on caring for yourself after your procedure. Your health care provider may also give you more specific instructions. Your treatment has been planned according to current medical practices, but problems sometimes occur. Call your health care provider if you have any problems or questions after your procedure.  WHAT TO EXPECT AFTER THE PROCEDURE After your procedure, it is typical to have the following sensations:  Minor discomfort or tenderness and a small bump at the catheter insertion site. The bump should usually decrease in size and tenderness within 1 to 2 weeks.  Any bruising will usually fade within 2 to 4 weeks. HOME CARE INSTRUCTIONS   You may need to keep taking blood thinners if they were prescribed for you. Only take over-the-counter or prescription medicines for pain, fever, or discomfort as directed by your health care provider.  Do not apply powder or lotion to the site.  Do not sit in a bathtub, swimming pool, or whirlpool for 5 to 7 days.  You may shower 24 hours after the procedure. Remove the bandage (dressing) and gently wash the site with plain soap and water. Gently pat the site dry.  Inspect the site at least twice daily.  Limit your activity for the first 48 hours. Do not bend, squat, or lift anything over 20 lb (9 kg) or as directed by your health care provider.  Do not drive home if you are discharged the day of the procedure. Have someone else drive you. Follow instructions about when you can drive or return to work. SEEK MEDICAL CARE IF:  You get lightheaded when standing up.  You have drainage (other than a small amount of blood on the dressing).  You have chills.  You have a fever.  You have redness, warmth, swelling, or pain at the insertion site. SEEK  IMMEDIATE MEDICAL CARE IF:   You develop chest pain or shortness of breath, feel faint, or pass out.  You have bleeding, swelling larger than a walnut, or drainage from the catheter insertion site.  You develop pain, discoloration, coldness, or severe bruising in the leg or arm that held the catheter.  You have heavy bleeding from the site. If this happens, hold pressure on the site. MAKE SURE YOU:  Understand these instructions.  Will watch your condition.  Will get help right away if you are not doing well or get worse. Document Released: 06/17/2005 Document Revised: 08/01/2013 Document Reviewed: 04/23/2013 [facility] Patient Information 2014 [place].

## 2014-03-06 NOTE — CV Procedure (Signed)
Cardiac Catheterization Procedure Note  Name: Matthew [name] MRN: [id number] DOB: 1936/02/05  Procedure:  1. Left Heart Cath 2. Selective Coronary Angiography 3. SVG angiography 4. LIMA angiography. 5. LV angiography.  6. Abdominal aortogram with bilateral iliac angiography without distal runoff 7. Right common iliac artery angioplasty and stent placement 8. Left common iliac artery stent placement (bilateral kissing stents) 9. Mynx closure device x2 .   Indication: Angina with known history of coronary artery disease. Severe claudication. Please see the preoperative note.   Medications:  Sedation:  2  mg IV Versed, 100  mcg IV Fentanyl  Contrast:  175 Visipaque   Procedural details: The left groin was prepped, draped, and anesthetized with 1% lidocaine. Using modified Seldinger technique, a 5  French sheath was introduced into the left femoral artery. Standard Judkins catheters were used for coronary angiography and left ventriculography. LIMA catheter was used. The RIMA was not engaged as it was visualized on last week's catheterization at [facility].  Catheter exchanges were performed over a guidewire. There were no immediate procedural complications. The patient was transferred to the post catheterization recovery area for further monitoring.   Procedural Findings:  Hemodynamics: AO:  13 5/58   mmHg LV:  158/8    mmHg LVEDP: 13  mmHg  Coronary angiography: Coronary dominance: Right    Left Main:  Severely calcified with 80-90% disease.   Left Anterior Descending (LAD):  Severely calcified and occluded proximally.   Circumflex (LCx):  Severely calcified and occluded in the midsegment   Right Coronary Artery: Calcified and occluded at the ostium.   Radial graft to OM 3 is patent with no significant disease. The graft seems to be connected proximally to an old SVG graft which is occluded proximally.  SVG to OM 2 : Patent with diffuse 20% disease. The native OM 2 has a  90% proximal stenosis after the anastomosis. However, the vessel diameter is about 1.5 mm and the vessel is overall supplying a small area.    SVG to right PDA is patent with minor irregularities.  RIMA to diagonal is known to be patent from last week and was not selectively engaged.  LIMA to LAD patent with no significant disease.   Left ventriculography: Left ventricular systolic function is normal , LVEF is estimated at 55 %, there is no  significant mitral regurgitation   PV procedure note:  The pigtail catheter was pulled back to the abdominal aorta above the renal arteries. Abdominal angiogram was performed in the AP position. The catheter was pulled back to the distal aorta above the bifurcation. Bilateral iliac angiography was performed. This showed subtotally occluded right common iliac artery with significant disease involving the left common iliac artery. I then gained access into the right common femoral artery with the micropuncture sheath. This was exchanged into a 7 French bright tip sheath. The existing 5 French sheath in the left common femoral artery was exchanged into a 7 French bright tip sheath. 6000 units of unfractionated heparin was given. ACT was 205. In addition of 2000 units of unfractionated heparin was given. A subtotal occlusion in the right common iliac artery was crossed with the Versicore wire. Angioplasty was done with a 6 x 2 mm balloon. There was significant residual stenosis. Both sheaths were then advanced to the distal aorta. I advanced a 10 x 25 mm balloon expandable stents bilaterally. The sheaths were pulled back once I was in optimal position. Simultaneous deployment of stents was performed to  8 atmospheres extending 1 mm into the distal aorta. There was significant poststenotic dilatation especially on the right side. The stent was extending into the aneurysmal area but with no residual stenosis. Final angiography with a pigtail catheter showed excellent  results. Both sheaths were exchanged into a short 7 French sheath and then closed with the Mynx device.    Final Conclusions:  1. Severe three-vessel coronary artery disease with patent grafts.  2. Normal LV systolic function with borderline elevation of left ventricular end-diastolic pressure. 3. Moderate aortic stenosis with a peak to peak gradient of 23 mm mercury. 4. 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 5. Successful bilateral kissing stent placement to both common iliac arteries.  Recommendations:  Continue medical therapy for coronary artery disease. Treat with dual antiplatelet therapy for at least one month.   [name] MD, [facility] 03/06/2014, 12:25 PM

## 2014-03-06 NOTE — H&P (View-Only) (Signed)
Patient ID: Matthew [name], male    DOB: Aug 10, 1936, 78 y.o.   MRN: [id number]  HPI Comments: [name] is a 78 yo with h/o CAD, MI in 1996,  s/p CABG 1996 with redo 2004.  has h/o mild aortic stenosis by echocardiogram in 2010, HTN and hyperlipidemia.  In followup today, he reports that he has been having chest pain symptoms with exertion. It has been going on for 6 months, getting worse. Typically when he has the symptoms he is working in his yard. He has to sit down, take an aspirin and symptoms resolved. Denies having any significant symptoms at rest or with minimal exertion. He has not been taking nitroglycerin. This is outdated and he reports that aspirin works okay. Symptoms are consistent with prior angina though not as severe as prior to his bypass surgery. Also symptoms of shortness of breath with exertion  He is scheduled for cataract surgery on March 24 preop eval possibly on March 14    Echo normal EF with very mild AS in 2010 History of gallstones and underwent cholecystectomy in July 2011.  Prior lab work; Total cholesterol 151, HDL 40, LDL 81   EKG shows normal sinus rhythm , old inferior MI   Past Medical History: 1.         --Myoview 7/09 EF 54%, inferobasal infarct, no ischemia.         --Myoview. 6/10 EF 43% inferior wall infarct. no ischemia.        -- Echo 6/10: EF 55% mild AS mean 7 2.  Hyperlipidemia 3.  Hypertension 4.  Nephrolithiasis 5.  Aortic Stenosis, mild 6.  Carotid Bruits:  2009 0-39% on dopplers bilaterally. 7.  OSA    Outpatient Encounter Prescriptions as of 02/14/2014  Medication Sig  . acetaminophen (TYLENOL) 500 MG tablet Take 500 mg by mouth every 6 (six) hours as needed. 1-2 tablets    . aspirin buffered (BUFFERIN) 325 MG TABS tablet Take 325 mg by mouth daily.    Marland Kitchen atorvastatin (LIPITOR) 80 MG tablet Take 1 tablet (80 mg total) by mouth daily.  . hydrochlorothiazide (HYDRODIURIL) 25 MG tablet TAKE ONE TABLET BY MOUTH EVERY DAY  . isosorbide  dinitrate (ISORDIL) 10 MG tablet TAKE ONE TABLET BY MOUTH THREE TIMES DAILY  . lisinopril (PRINIVIL,ZESTRIL) 20 MG tablet TAKE ONE TABLET BY MOUTH EVERY DAY  . metoprolol (LOPRESSOR) 25 MG tablet TAKE ONE-HALF TABLET BY MOUTH TWICE DAILY  . nitroGLYCERIN (NITROSTAT) 0.4 MG SL tablet Place 1 tablet (0.4 mg total) under the tongue every 5 (five) minutes as needed.  Marland Kitchen omeprazole (PRILOSEC) 40 MG capsule Take 1 capsule (40 mg total) by mouth daily.    Review of Systems  HENT: Negative.   Eyes: Negative.   Respiratory: Positive for chest tightness and shortness of breath.   Cardiovascular: Positive for chest pain.  Gastrointestinal: Negative.   Musculoskeletal: Negative.   Skin: Negative.   Neurological: Negative.   Psychiatric/Behavioral: Negative.   All other systems reviewed and are negative.    BP 140/72  Pulse 63  Ht 5\' 9"  (1.753 m)  Wt 199 lb 12 oz (90.606 kg)  BMI 29.48 kg/m2  Physical Exam  Nursing note and vitals reviewed. Constitutional: He is oriented to person, place, and time. He appears well-developed and well-nourished.  HENT:  Head: Normocephalic.  Nose: Nose normal.  Mouth/Throat: Oropharynx is clear and moist.  Eyes: Conjunctivae are normal. Pupils are equal, round, and reactive to light.  Neck: Normal range  of motion. Neck supple. No JVD present.  Cardiovascular: Normal rate, regular rhythm, S1 normal, S2 normal and intact distal pulses.  Exam reveals no gallop and no friction rub.   Murmur heard.  Crescendo systolic murmur is present with a grade of 2/6  Pulmonary/Chest: Effort normal and breath sounds normal. No respiratory distress. He has no wheezes. He has no rales. He exhibits no tenderness.  Abdominal: Soft. Bowel sounds are normal. He exhibits no distension. There is no tenderness.  Musculoskeletal: Normal range of motion. He exhibits no edema and no tenderness.  Lymphadenopathy:    He has no cervical adenopathy.  Neurological: He is alert and  oriented to person, place, and time. Coordination normal.  Skin: Skin is warm and dry. No rash noted. No erythema.  Psychiatric: He has a normal mood and affect. His behavior is normal. Judgment and thought content normal.      Assessment and Plan

## 2014-03-08 ENCOUNTER — Other Ambulatory Visit (HOSPITAL_COMMUNITY): Payer: Self-pay | Admitting: *Deleted

## 2014-03-08 DIAGNOSIS — I739 Peripheral vascular disease, unspecified: Secondary | ICD-10-CM

## 2014-03-22 ENCOUNTER — Ambulatory Visit (INDEPENDENT_AMBULATORY_CARE_PROVIDER_SITE_OTHER): Payer: Medicare Other | Admitting: Cardiovascular Disease

## 2014-03-22 ENCOUNTER — Encounter (INDEPENDENT_AMBULATORY_CARE_PROVIDER_SITE_OTHER): Payer: Medicare Other

## 2014-03-22 ENCOUNTER — Telehealth: Payer: Self-pay | Admitting: *Deleted

## 2014-03-22 ENCOUNTER — Other Ambulatory Visit (HOSPITAL_COMMUNITY): Payer: Self-pay | Admitting: *Deleted

## 2014-03-22 ENCOUNTER — Encounter: Payer: Self-pay | Admitting: Cardiovascular Disease

## 2014-03-22 VITALS — BP 100/50 | HR 69 | Ht 69.0 in | Wt 195.0 lb

## 2014-03-22 DIAGNOSIS — I739 Peripheral vascular disease, unspecified: Secondary | ICD-10-CM

## 2014-03-22 DIAGNOSIS — E785 Hyperlipidemia, unspecified: Secondary | ICD-10-CM

## 2014-03-22 DIAGNOSIS — I359 Nonrheumatic aortic valve disorder, unspecified: Secondary | ICD-10-CM

## 2014-03-22 DIAGNOSIS — I2510 Atherosclerotic heart disease of native coronary artery without angina pectoris: Secondary | ICD-10-CM

## 2014-03-22 MED ORDER — ASPIRIN EC 81 MG PO TBEC: 81.0000 mg | DELAYED_RELEASE_TABLET | Freq: Every day | ORAL | Status: DC

## 2014-03-22 NOTE — Assessment & Plan Note (Signed)
Recent cardiac catheterization showed patent grafts. He reports no further chest pain. He is at low risk for cataract surgery. Decrease aspirin to 81 mg once daily.

## 2014-03-22 NOTE — Progress Notes (Signed)
Primary cardiologist: Dr. Rockey Situ  HPI  [name] is a 78 yo with h/o CAD, MI in 1996,  s/p CABG 1996 with redo 2004.  has h/o mild aortic stenosis by echocardiogram in 2010, HTN and hyperlipidemia.  He was seen recently by Dr. Rockey Situ for chest pain worrisome for angina. Cardiac catheterization was attempted via the right femoral artery. However, he was found to have subtotally occluded common iliac artery with inability to advance the catheters. Femoral pulse was also weak on the left side. I attempted catheterization via the right radial artery but he was found to have significant tortuosity in the innominate artery with inability to go to catheters. The reported significant bilateral buttock and leg claudication. I proceeded with cardiac catheterization via the left femoral artery at [facility] which showed patent grafts with normal ejection fraction. There was moderate aortic stenosis. He was found to have subtotally occluded right common iliac artery and 70% stenosis in the left common iliac artery. I performed successful bilateral kissing stent placement without complications. He has been doing well and denies any further claudication. He reports no further chest pain.  No Known Allergies   Current Outpatient Prescriptions on File Prior to Visit  Medication Sig Dispense Refill  . acetaminophen (TYLENOL) 325 MG tablet Take 650 mg by mouth daily as needed.      Marland Kitchen aspirin buffered (BUFFERIN) 325 MG TABS tablet Take 325 mg by mouth daily.       Marland Kitchen atorvastatin (LIPITOR) 80 MG tablet Take 80 mg by mouth daily.      . clopidogrel (PLAVIX) 75 MG tablet Take 1 tablet (75 mg total) by mouth daily.  30 tablet  3  . hydrochlorothiazide (HYDRODIURIL) 25 MG tablet Take 25 mg by mouth daily. TAKE ONE TABLET BY MOUTH EVERY DAY      . isosorbide dinitrate (ISORDIL) 10 MG tablet Take 10 mg by mouth daily.      Marland Kitchen lisinopril (PRINIVIL,ZESTRIL) 20 MG tablet Take 20 mg by mouth daily.      . metoprolol tartrate  (LOPRESSOR) 25 MG tablet Take 12.5 mg by mouth 2 (two) times daily.      . nitroGLYCERIN (NITROSTAT) 0.4 MG SL tablet Place 0.4 mg under the tongue every 5 (five) minutes as needed.      Marland Kitchen omeprazole (PRILOSEC) 40 MG capsule Take 40 mg by mouth daily.       No current facility-administered medications on file prior to visit.     Past Medical History  Diagnosis Date  . CAD (coronary artery disease)     MI 66 with CABG 1996. Redo CABG 06/2003.Marland Kitchen Myoview 7/09 EF 54% inferobasal infarct. no ischemia. Myoview 6/10 EF 43% inferior wall infarct. no ischemai. Echo 6/10: EF 55% mild AS mean 7.  . Hyperlipidemia   . HTN (hypertension)   . Nephrolithiasis   . AS (aortic stenosis)     mild  . Carotid bruit     2009 0-39% on dopplers bilatrally  . OSA (obstructive sleep apnea)   . Myocardial infarction 1996  . Shortness of breath   . GERD (gastroesophageal reflux disease)   . HOH (hard of hearing)   . History of kidney stones      Past Surgical History  Procedure Laterality Date  . Stomach surgery      removal of gastric ulcers  . Coronary artery bypass graft  1991    at [facility]. Redone 2004-3 vessels 1st time and 4 second time  . Inguinal hernia repair  11/10/2011    Procedure: HERNIA REPAIR INGUINAL ADULT;  Surgeon: Donato Heinz;  Location: [facility];  Service: General;  Laterality: Right;     Family History  Problem Relation Age of Onset  . Heart disease Father   . Heart disease Mother      History   Social History  . Marital Status: Married    Spouse Name: N/A    Number of Children: N/A  . Years of Education: N/A   Occupational History  . Not on file.   Social History Main Topics  . Smoking status: Former Smoker -- 1.00 packs/day for 50 years    Types: Cigarettes    Quit date: 01/24/1990  . Smokeless tobacco: Not on file     Comment: quit in early 90's  . Alcohol Use: No  . Drug Use: No  . Sexual Activity: Not on file   Other Topics Concern  . Not on file    Social History Narrative   Married an lives with wife in [place]. Retired form [employer].       PHYSICAL EXAM   BP 100/50  Pulse 69  Ht 5\' 9"  (1.753 m)  Wt 195 lb (88.451 kg)  BMI 28.78 kg/m2 Constitutional: He is oriented to person, place, and time. He appears well-developed and well-nourished. No distress.  HENT: No nasal discharge.  Head: Normocephalic and atraumatic.  Eyes: Pupils are equal and round.  No discharge. Neck: Normal range of motion. Neck supple. No JVD present. No thyromegaly present.  Cardiovascular: Normal rate, regular rhythm, normal heart sounds. Exam reveals no gallop and no friction rub. There is a 3/6 systolic ejection murmur at the aortic area which is mid peaking. S2 is mildly diminished.  Pulmonary/Chest: Effort normal and breath sounds normal. No stridor. No respiratory distress. He has no wheezes. He has no rales. He exhibits no tenderness.  Abdominal: Soft. Bowel sounds are normal. He exhibits no distension. There is no tenderness. There is no rebound and no guarding.  Musculoskeletal: Normal range of motion. He exhibits no edema and no tenderness.  Neurological: He is alert and oriented to person, place, and time. Coordination normal.  Skin: Skin is warm and dry. No rash noted. He is not diaphoretic. No erythema. No pallor.  Psychiatric: He has a normal mood and affect. His behavior is normal. Judgment and thought content normal.  Vascular: Femoral pulses normal bilaterally. Distal pulses are palpable.     EKG: Normal sinus rhythm with old inferior infarct.   ASSESSMENT AND PLAN

## 2014-03-22 NOTE — Telephone Encounter (Signed)
Yes. He is at low risk.

## 2014-03-22 NOTE — Assessment & Plan Note (Signed)
This was moderate by cardiac catheterization with a peak to peak gradient of 23 mm mercury. Recommend a followup echocardiogram in one year.

## 2014-03-22 NOTE — Patient Instructions (Signed)
Your physician has requested that you have an aurto-iliac duplex in 3 months. During this test, an ultrasound is used to evaluate the aorta. Allow 30 minutes for this exam. Do not eat after midnight the day before and avoid carbonated beverages   Your physician has recommended you make the following change in your medication:  Decrease Aspirin to 81 mg daily   Your physician wants you to follow-up in: 6 months. You will receive a reminder letter in the mail two months in advance. If you don't receive a letter, please call our office to schedule the follow-up appointment.

## 2014-03-22 NOTE — Assessment & Plan Note (Signed)
Status post successful bilateral common iliac artery stenting. He reports complete resolution of claudication. ABI today was normal bilaterally. Recommend an aortoiliac duplex in 3 months.

## 2014-03-22 NOTE — Telephone Encounter (Signed)
Can patient have cardiac clearance for cataract sx at [facility]

## 2014-03-22 NOTE — Assessment & Plan Note (Signed)
Continue treatment with atorvastatin. 

## 2014-03-25 ENCOUNTER — Encounter: Payer: Self-pay | Admitting: *Deleted

## 2014-03-25 NOTE — Telephone Encounter (Signed)
Cardiac clearance letter sent to [facility]

## 2014-04-15 ENCOUNTER — Telehealth: Payer: Self-pay

## 2014-04-15 NOTE — Telephone Encounter (Signed)
Jenny Reichmann calling from [facility] for Clearance for this pt. States she needs the letter to have a Dr. Serena Croissant on it. She is going to fax another request.

## 2014-04-17 ENCOUNTER — Other Ambulatory Visit: Payer: Self-pay | Admitting: Cardiovascular Disease

## 2014-04-22 ENCOUNTER — Telehealth: Payer: Self-pay

## 2014-04-22 NOTE — Telephone Encounter (Signed)
Faxed clearance for cataract surgery to [facility]. Per Dr. Rockey Situ, "This patient is cleared for surgery".

## 2014-04-29 ENCOUNTER — Ambulatory Visit: Payer: Self-pay | Admitting: Ophthalmology

## 2014-04-29 LAB — POTASSIUM: POTASSIUM: 2.8 mmol/L — AB (ref 3.5–5.1)

## 2014-05-07 ENCOUNTER — Ambulatory Visit: Payer: Self-pay | Admitting: Ophthalmology

## 2014-06-06 ENCOUNTER — Other Ambulatory Visit (HOSPITAL_COMMUNITY): Payer: Self-pay | Admitting: *Deleted

## 2014-06-06 DIAGNOSIS — I739 Peripheral vascular disease, unspecified: Secondary | ICD-10-CM

## 2014-06-12 NOTE — Telephone Encounter (Signed)
This encounter was created in error - please disregard.

## 2014-06-27 ENCOUNTER — Encounter (INDEPENDENT_AMBULATORY_CARE_PROVIDER_SITE_OTHER): Payer: Medicare Other

## 2014-06-27 DIAGNOSIS — I700 Atherosclerosis of aorta: Secondary | ICD-10-CM

## 2014-06-27 DIAGNOSIS — I739 Peripheral vascular disease, unspecified: Secondary | ICD-10-CM

## 2014-06-27 DIAGNOSIS — I2510 Atherosclerotic heart disease of native coronary artery without angina pectoris: Secondary | ICD-10-CM

## 2014-07-04 ENCOUNTER — Telehealth: Payer: Self-pay | Admitting: *Deleted

## 2014-07-04 DIAGNOSIS — I739 Peripheral vascular disease, unspecified: Secondary | ICD-10-CM

## 2014-07-04 NOTE — Telephone Encounter (Signed)
Message copied by Tracie Harrier on Thu Jul 04, 2014  4:24 PM ------      Message from: [name] A      Created: Tue Jul 02, 2014  8:29 AM       Patent iliac stents with some scar tissue on right side which is not affecting blood flow. Recommend an aortoiliac dupelx and ABI in 3 months. ------

## 2014-07-04 NOTE — Telephone Encounter (Signed)
Patient aware of results.

## 2014-07-10 ENCOUNTER — Ambulatory Visit: Payer: Self-pay | Admitting: Ophthalmology

## 2014-07-10 LAB — POTASSIUM: Potassium: 3.4 mmol/L — ABNORMAL LOW (ref 3.5–5.1)

## 2014-07-12 ENCOUNTER — Other Ambulatory Visit: Payer: Self-pay | Admitting: Cardiovascular Disease

## 2014-07-12 ENCOUNTER — Telehealth: Payer: Self-pay | Admitting: *Deleted

## 2014-07-12 NOTE — Telephone Encounter (Signed)
Cardiac clearance faxed  

## 2014-07-12 NOTE — Telephone Encounter (Signed)
Dorian Pod with [facility] needs cardiac clearance stat on Roddy. Faxed over on 07/10/14. He is having caract surgery on Tues. 07/16/14 Fax # [phone]

## 2014-07-12 NOTE — Telephone Encounter (Signed)
Matthew [name] with [facility] needs cardiac clearance stat on Matthew [name]. Faxed over on 07/10/14. He is having caract surgery on Tues. 07/16/14  Fax # [phone]

## 2014-07-16 ENCOUNTER — Ambulatory Visit: Payer: Self-pay | Admitting: Ophthalmology

## 2014-07-19 ENCOUNTER — Other Ambulatory Visit: Payer: Self-pay | Admitting: Cardiovascular Disease

## 2014-07-22 ENCOUNTER — Telehealth: Payer: Self-pay

## 2014-07-22 NOTE — Telephone Encounter (Signed)
Pt has question regarding his metoprolol . Please call.

## 2014-07-22 NOTE — Telephone Encounter (Signed)
Patient called stating he wants to make sure that he is supposed to only be taking 1/2 a tablet of metoprolol twice daily  Confirmed dose with patient  Patient verbalized understanding

## 2014-08-16 ENCOUNTER — Other Ambulatory Visit: Payer: Self-pay | Admitting: Cardiovascular Disease

## 2014-09-17 ENCOUNTER — Other Ambulatory Visit: Payer: Self-pay | Admitting: Cardiovascular Disease

## 2014-10-03 ENCOUNTER — Encounter (INDEPENDENT_AMBULATORY_CARE_PROVIDER_SITE_OTHER): Payer: Medicare Other

## 2014-10-03 ENCOUNTER — Ambulatory Visit (INDEPENDENT_AMBULATORY_CARE_PROVIDER_SITE_OTHER): Payer: Medicare Other | Admitting: Cardiovascular Disease

## 2014-10-03 ENCOUNTER — Encounter: Payer: Self-pay | Admitting: Cardiovascular Disease

## 2014-10-03 VITALS — BP 130/78 | HR 53 | Ht 69.0 in | Wt 191.0 lb

## 2014-10-03 DIAGNOSIS — I359 Nonrheumatic aortic valve disorder, unspecified: Secondary | ICD-10-CM

## 2014-10-03 DIAGNOSIS — I739 Peripheral vascular disease, unspecified: Secondary | ICD-10-CM

## 2014-10-03 DIAGNOSIS — I700 Atherosclerosis of aorta: Secondary | ICD-10-CM

## 2014-10-03 DIAGNOSIS — R0789 Other chest pain: Secondary | ICD-10-CM

## 2014-10-03 DIAGNOSIS — I25118 Atherosclerotic heart disease of native coronary artery with other forms of angina pectoris: Secondary | ICD-10-CM

## 2014-10-03 NOTE — Assessment & Plan Note (Signed)
He continues to have atypical chest discomfort but overall he has been stable. Continue medical therapy.

## 2014-10-03 NOTE — Patient Instructions (Addendum)
Your physician has requested that you have an  aorto-iliac duplex in 6 months. During this test, an ultrasound is used to evaluate the aorta. Allow 30 minutes for this exam. Do not eat after midnight the day before and avoid carbonated beverages.  Your physician wants you to follow-up in: 6 months on the same day as your aorto-iliac duplex. You will receive a reminder letter in the mail two months in advance. If you don't receive a letter, please call our office to schedule the follow-up appointment.  Your next appointment will be scheduled in our new office located at :  [facility]  [address] [place]  [place], [place] [address]

## 2014-10-03 NOTE — Progress Notes (Signed)
Primary cardiologist: Dr. Rockey Situ  HPI  [name] is a 78 yo with h/o CAD, MI in 1996,  s/p CABG 1996 with redo 2004.  has h/o mild aortic stenosis by echocardiogram in 2010, HTN and hyperlipidemia.  He was seen in March of this year by Dr. Rockey Situ for chest pain worrisome for angina. Cardiac catheterization was attempted via the right femoral artery. However, he was found to have subtotally occluded common iliac artery with inability to advance the catheters. Femoral pulse was also weak on the left side. I attempted catheterization via the right radial artery but he was found to have significant tortuosity in the innominate artery with inability to go to catheters. The reported significant bilateral buttock and leg claudication. I proceeded with cardiac catheterization via the left femoral artery at [facility] which showed patent grafts with normal ejection fraction. There was moderate aortic stenosis. He was found to have subtotally occluded right common iliac artery and 70% stenosis in the left common iliac artery. I performed successful bilateral kissing stent placement without complications. Claudication resolved completely although he still complains of mild back discomfort only when he was cutting wood. He has occasional episodes of chest discomfort that resolves after he takes a baby aspirin. Followup aortoiliac duplex showed restenosis in the right common iliac artery but ABI remained normal.  No Known Allergies   Current Outpatient Prescriptions on File Prior to Visit  Medication Sig Dispense Refill  . acetaminophen (TYLENOL) 325 MG tablet Take 650 mg by mouth daily as needed.      Marland Kitchen aspirin EC 81 MG tablet Take 1 tablet (81 mg total) by mouth daily.  90 tablet  3  . atorvastatin (LIPITOR) 80 MG tablet Take 80 mg by mouth daily.      . clopidogrel (PLAVIX) 75 MG tablet TAKE ONE TABLET BY MOUTH ONCE DAILY  30 tablet  1  . hydrochlorothiazide (HYDRODIURIL) 25 MG tablet Take 25 mg by mouth daily.  TAKE ONE TABLET BY MOUTH EVERY DAY      . isosorbide dinitrate (ISORDIL) 10 MG tablet TAKE ONE TABLET BY MOUTH THREE TIMES DAILY  270 tablet  6  . lisinopril (PRINIVIL,ZESTRIL) 20 MG tablet Take 20 mg by mouth daily.      . metoprolol tartrate (LOPRESSOR) 25 MG tablet TAKE ONE-HALF TABLET BY MOUTH TWICE DAILY  90 tablet  3  . nitroGLYCERIN (NITROSTAT) 0.4 MG SL tablet Place 0.4 mg under the tongue every 5 (five) minutes as needed.      Marland Kitchen omeprazole (PRILOSEC) 40 MG capsule TAKE ONE CAPSULE BY MOUTH ONCE DAILY  90 capsule  3  . tamsulosin (FLOMAX) 0.4 MG CAPS capsule Take 0.4 mg by mouth daily.        No current facility-administered medications on file prior to visit.     Past Medical History  Diagnosis Date  . Hyperlipidemia   . HTN (hypertension)   . Nephrolithiasis   . AS (aortic stenosis)     mild  . Carotid bruit     2009 0-39% on dopplers bilatrally  . OSA (obstructive sleep apnea)   . Myocardial infarction 1996  . Shortness of breath   . GERD (gastroesophageal reflux disease)   . HOH (hard of hearing)   . History of kidney stones   . CAD (coronary artery disease)     MI 33 with CABG 1996. Redo CABG 06/2003.Marland Kitchen Myoview 7/09 EF 54% inferobasal infarct. no ischemia. Myoview 6/10 EF 43% inferior wall infarct. no ischemai. Echo 6/10:  EF 55% mild AS mean 7.  . PAD (peripheral artery disease) 02/2014    Subtotal occlusion of right common iliac artery and 70% stenosis in the left common iliac artery. Status post bilateral kissing stent placement. 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 Stomach surgery      removal of gastric ulcers  . Coronary artery bypass graft  1991    at [facility]. Redone 2004-3 vessels 1st time and 4 second time  . Inguinal hernia repair   11/10/2011    Procedure: HERNIA REPAIR INGUINAL ADULT;  Surgeon: Donato Heinz;  Location: [facility];  Service: General;  Laterality: Right;  . Cardiac catheterization  02/2014    Severe three-vessel coronary artery disease with patent grafts.      Family History  Problem Relation Age of Onset  . Heart disease Father   . Heart disease Mother      History   Social History  . Marital Status: Married    Spouse Name: N/A    Number of Children: N/A  . Years of Education: N/A   Occupational History  . Not on file.   Social History Main Topics  . Smoking status: Former Smoker -- 1.00 packs/day for 50 years    Types: Cigarettes    Quit date: 01/24/1990  . Smokeless tobacco: Not on file     Comment: quit in early 90's  . Alcohol Use: No  . Drug Use: No  . Sexual Activity: Not on file   Other Topics Concern  . Not on file   Social History Narrative   Married an lives with wife in [place]. Retired form [employer].       PHYSICAL EXAM   BP 130/78  Pulse 53  Ht 5\' 9"  (1.753 m)  Wt 191 lb (86.637 kg)  BMI 28.19 kg/m2 Constitutional: He is oriented to person, place, and time. He appears well-developed and well-nourished. No distress.  HENT: No nasal discharge.  Head: Normocephalic and atraumatic.  Eyes: Pupils are equal and round.  No discharge. Neck: Normal range of motion. Neck supple. No JVD present. No thyromegaly present.  Cardiovascular: Normal rate, regular rhythm, normal heart sounds. Exam reveals no gallop and no friction rub. There is a 3/6 systolic ejection murmur at the aortic area which is mid peaking. S2 is mildly diminished.  Pulmonary/Chest: Effort normal and breath sounds normal. No stridor. No respiratory distress. He has no wheezes. He has no rales. He exhibits no tenderness.  Abdominal: Soft. Bowel sounds are normal. He exhibits no distension. There is no tenderness. There is no rebound and no guarding.  Musculoskeletal: Normal range of  motion. He exhibits no edema and no tenderness.  Neurological: He is alert and oriented to person, place, and time. Coordination normal.  Skin: Skin is warm and dry. No rash noted. He is not diaphoretic. No erythema. No pallor.  Psychiatric: He has a normal mood and affect. His behavior is normal. Judgment and thought content normal.  Vascular: Femoral pulses normal bilaterally. Distal pulses are palpable.     EKG: Sinus  Bradycardia  -Old inferior infarct.   -Nonspecific ST depression  -Nondiagnostic.   ABNORMAL     ASSESSMENT AND PLAN

## 2014-10-03 NOTE — Assessment & Plan Note (Signed)
Noninvasive evaluation today showed stable velocity and restenosis in the right common iliac artery stent. ABI remained normal and he has no claudication. Thus, I recommend continuing medical therapy. Repeat aortoiliac duplex in 6 months.

## 2014-10-03 NOTE — Assessment & Plan Note (Signed)
This was moderate by cardiac catheterization in March with a peak to peak gradient of 23 mm mercury. Recommend a followup echocardiogram next year.

## 2014-10-03 NOTE — Assessment & Plan Note (Signed)
Continue treatment with atorvastatin with a target LDL of less than 70. 

## 2014-11-15 ENCOUNTER — Other Ambulatory Visit: Payer: Self-pay | Admitting: Cardiovascular Disease

## 2014-11-21 ENCOUNTER — Encounter (HOSPITAL_COMMUNITY): Payer: Self-pay | Admitting: Cardiovascular Disease

## 2014-12-09 ENCOUNTER — Other Ambulatory Visit: Payer: Self-pay | Admitting: Cardiovascular Disease

## 2015-01-18 ENCOUNTER — Other Ambulatory Visit: Payer: Self-pay | Admitting: Cardiovascular Disease

## 2015-04-01 ENCOUNTER — Other Ambulatory Visit: Payer: Self-pay

## 2015-04-01 MED ORDER — CLOPIDOGREL BISULFATE 75 MG PO TABS: 75.0000 mg | ORAL_TABLET | Freq: Every day | ORAL | Status: DC

## 2015-04-01 NOTE — Telephone Encounter (Signed)
Refill sent for plavix  

## 2015-04-04 ENCOUNTER — Encounter: Payer: Self-pay | Admitting: Cardiovascular Disease

## 2015-04-04 ENCOUNTER — Ambulatory Visit (INDEPENDENT_AMBULATORY_CARE_PROVIDER_SITE_OTHER): Payer: Medicare Other | Admitting: Cardiovascular Disease

## 2015-04-04 VITALS — BP 130/70 | HR 63 | Ht 69.0 in | Wt 194.0 lb

## 2015-04-04 DIAGNOSIS — I25118 Atherosclerotic heart disease of native coronary artery with other forms of angina pectoris: Secondary | ICD-10-CM | POA: Diagnosis not present

## 2015-04-04 DIAGNOSIS — R079 Chest pain, unspecified: Secondary | ICD-10-CM

## 2015-04-04 DIAGNOSIS — I10 Essential (primary) hypertension: Secondary | ICD-10-CM | POA: Diagnosis not present

## 2015-04-04 DIAGNOSIS — I359 Nonrheumatic aortic valve disorder, unspecified: Secondary | ICD-10-CM

## 2015-04-04 DIAGNOSIS — I739 Peripheral vascular disease, unspecified: Secondary | ICD-10-CM

## 2015-04-04 MED ORDER — TAMSULOSIN HCL 0.4 MG PO CAPS: 0.4000 mg | ORAL_CAPSULE | Freq: Every day | ORAL | Status: DC

## 2015-04-04 NOTE — Assessment & Plan Note (Signed)
No further claudication after bilateral iliac stenting. Recommend a follow-up aortoiliac duplex.

## 2015-04-04 NOTE — Assessment & Plan Note (Signed)
Blood pressure is controlled

## 2015-04-04 NOTE — Progress Notes (Signed)
Primary cardiologist: Dr. Rockey Situ  HPI  [name] is a 79 yo with h/o CAD, MI in 1996,  s/p CABG 1996 with redo 2004.  has h/o mild aortic stenosis by echocardiogram in 2010, HTN and hyperlipidemia.  He was seen in March of 2015 by Dr. Rockey Situ for chest pain worrisome for angina. Cardiac catheterization was attempted via the right femoral artery. However, he was found to have subtotally occluded common iliac artery with inability to advance the catheters. Femoral pulse was also weak on the left side. I attempted catheterization via the right radial artery but he was found to have significant tortuosity in the innominate artery with inability to go to catheters. The reported significant bilateral buttock and leg claudication. I proceeded with cardiac catheterization via the left femoral artery at [facility] which showed patent grafts with normal ejection fraction. There was moderate aortic stenosis. He was found to have subtotally occluded right common iliac artery and 70% stenosis in the left common iliac artery. I performed successful bilateral kissing stent placement without complications. Claudication resolved completely. He has occasional episodes of chest discomfort that resolves after he takes a baby aspirin.  He reports right arm and shoulder pain which seems to be musculoskeletal. I asked him to follow-up with his primary care physician regarding that.  No Known Allergies   Current Outpatient Prescriptions on File Prior to Visit  Medication Sig Dispense Refill  . acetaminophen (TYLENOL) 325 MG tablet Take 650 mg by mouth daily as needed.    Marland Kitchen aspirin EC 81 MG tablet Take 1 tablet (81 mg total) by mouth daily. 90 tablet 3  . atorvastatin (LIPITOR) 80 MG tablet Take 80 mg by mouth daily.    . clopidogrel (PLAVIX) 75 MG tablet Take 1 tablet (75 mg total) by mouth daily. 30 tablet 6  . hydrochlorothiazide (HYDRODIURIL) 25 MG tablet Take 25 mg by mouth daily. TAKE ONE TABLET BY MOUTH EVERY DAY    .  isosorbide dinitrate (ISORDIL) 10 MG tablet TAKE ONE TABLET BY MOUTH THREE TIMES DAILY 270 tablet 6  . lisinopril (PRINIVIL,ZESTRIL) 20 MG tablet Take 20 mg by mouth daily.    . metoprolol tartrate (LOPRESSOR) 25 MG tablet TAKE ONE-HALF TABLET BY MOUTH TWICE DAILY 90 tablet 3  . nitroGLYCERIN (NITROSTAT) 0.4 MG SL tablet Place 0.4 mg under the tongue every 5 (five) minutes as needed.    Marland Kitchen omeprazole (PRILOSEC) 40 MG capsule TAKE ONE CAPSULE BY MOUTH ONCE DAILY 90 capsule 3  . tamsulosin (FLOMAX) 0.4 MG CAPS capsule Take 0.4 mg by mouth daily.      No current facility-administered medications on file prior to visit.     Past Medical History  Diagnosis Date  . Hyperlipidemia   . HTN (hypertension)   . Nephrolithiasis   . AS (aortic stenosis)     mild  . Carotid bruit     2009 0-39% on dopplers bilatrally  . OSA (obstructive sleep apnea)   . Myocardial infarction 1996  . Shortness of breath   . GERD (gastroesophageal reflux disease)   . HOH (hard of hearing)   . History of kidney stones   . CAD (coronary artery disease)     MI 51 with CABG 1996. Redo CABG 06/2003.Marland Kitchen Myoview 7/09 EF 54% inferobasal infarct. no ischemia. Myoview 6/10 EF 43% inferior wall infarct. no ischemai. Echo 6/10: EF 55% mild AS mean 7.  . PAD (peripheral artery disease) 02/2014    Subtotal occlusion of right common iliac artery and 70%  stenosis in the left common iliac artery. Status post bilateral kissing stent placement. 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 Stomach surgery      removal of gastric ulcers  . Coronary artery bypass graft  1991    at [facility]. Redone 2004-3 vessels 1st time and 4 second time  . Inguinal hernia repair  11/10/2011    Procedure: HERNIA REPAIR INGUINAL ADULT;   Surgeon: Donato Heinz;  Location: [facility];  Service: General;  Laterality: Right;  . Cardiac catheterization  02/2014    Severe three-vessel coronary artery disease with patent grafts.   . Left heart catheterization with coronary/graft angiogram N/A 03/06/2014    Procedure: LEFT HEART CATHETERIZATION WITH Beatrix Fetters;  Surgeon: Wellington Hampshire, MD;  Location: [facility] CATH LAB;  Service: Cardiovascular;  Laterality: N/A;  . Abdominal aortagram N/A 03/06/2014    Procedure: ABDOMINAL Maxcine Ham;  Surgeon: Wellington Hampshire, MD;  Location: [facility] CATH LAB;  Service: Cardiovascular;  Laterality: N/A;     Family History  Problem Relation Age of Onset  . Heart disease Father   . Heart disease Mother      History   Social History  . Marital Status: Married    Spouse Name: N/A  . Number of Children: N/A  . Years of Education: N/A   Occupational History  . Not on file.   Social History Main Topics  . Smoking status: Former Smoker -- 1.00 packs/day for 50 years    Types: Cigarettes    Quit date: 01/24/1990  . Smokeless tobacco: Not on file     Comment: quit in early 90's  . Alcohol Use: No  . Drug Use: No  . Sexual Activity: Not on file   Other Topics Concern  . Not on file   Social History Narrative   Married an lives with wife in [place]. Retired form [employer].       PHYSICAL EXAM   BP 130/70 mmHg  Pulse 63  Ht 5\' 9"  (1.753 m)  Wt 194 lb (87.998 kg)  BMI 28.64 kg/m2 Constitutional: He is oriented to person, place, and time. He appears well-developed and well-nourished. No distress.  HENT: No nasal discharge.  Head: Normocephalic and atraumatic.  Eyes: Pupils are equal and round.  No discharge. Neck: Normal range of motion. Neck supple. No JVD present. No thyromegaly present.  Cardiovascular: Normal rate, regular rhythm, normal heart sounds. Exam reveals no gallop and no friction rub. There is a 3/6 systolic ejection murmur at the aortic area which  is mid peaking. S2 is mildly diminished.  Pulmonary/Chest: Effort normal and breath sounds normal. No stridor. No respiratory distress. He has no wheezes. He has no rales. He exhibits no tenderness.  Abdominal: Soft. Bowel sounds are normal. He exhibits no distension. There is no tenderness. There is no rebound and no guarding.  Musculoskeletal: Normal range of motion. He exhibits no edema and no tenderness.  Neurological: He is alert and oriented to person, place, and time. Coordination normal.  Skin: Skin is warm and dry. No rash noted. He is not diaphoretic. No erythema. No pallor.  Psychiatric: He has a normal mood and affect. His behavior is normal. Judgment and thought content normal.  Vascular: Femoral pulses normal bilaterally. Distal pulses are palpable.  EKG: Sinus rhythm with PACs, left ventricular hypertrophy with poor R-wave progression. Old inferior infarct.    ASSESSMENT AND PLAN

## 2015-04-04 NOTE — Assessment & Plan Note (Signed)
This was moderate by cardiac catheterization in March of 2015 with a peak to peak gradient of 23 mm mercury. Recommend a followup echocardiogram later this year.

## 2015-04-04 NOTE — Patient Instructions (Signed)
Your physician recommends that you continue on your current medications as directed. Please refer to the Current Medication list given to you today.  No medications changes made today.  Your physician has requested that you have aortic lliac duplex. This test is an ultrasound of the arteries in the legs or arms. It looks at arterial blood flow in the legs and arms. Allow one hour for Lower and Upper Arterial scans. There are no restrictions or special instructions.   Your physician recommends that you schedule a follow-up appointment in: 6 months with Dr. Fletcher Anon

## 2015-04-04 NOTE — Assessment & Plan Note (Signed)
He has stable angina with no change in symptoms. Continue medical therapy.

## 2015-04-05 NOTE — Op Note (Signed)
  PATIENT NAME:  Matthew [name], Matthew [name] MR#:  [id number] DATE OF BIRTH:  [birth year]  LOCATION:  [facility]   DATE OF PROCEDURE:  05/07/2014  PREOPERATIVE DIAGNOSIS: Visually significant cataract of the right eye.   POSTOPERATIVE DIAGNOSIS: Visually significant cataract of the right eye.   OPERATIVE PROCEDURE: Cataract extraction by phacoemulsification with implant of intraocular lens to right eye.   SURGEON: Birder Robson, MD.   ANESTHESIA:  1. Managed anesthesia care.  2. Topical tetracaine drops followed by 2% Xylocaine jelly applied in the preoperative holding area.   COMPLICATIONS: None.   TECHNIQUE:  Stop-and-chop.  LENS IMPLANT:  Tecnis ZCB00 20.0-diopter lens, serial number [id number].  DESCRIPTION OF PROCEDURE: The patient was examined and consented in the preoperative holding area where the aforementioned topical anesthesia was applied to the right eye and then brought back to the Operating Room where the right eye was prepped and draped in the usual sterile ophthalmic fashion and a lid speculum was placed.   A paracentesis was created with the side port blade and the anterior chamber was filled with viscoelastic. A near clear corneal incision was performed with the steel keratome. A continuous curvilinear capsulorrhexis was performed with a cystotome followed by the capsulorrhexis forceps. Hydrodissection and hydrodelineation were carried out with BSS on a blunt cannula. The lens was removed in a stop-and-chop technique and the remaining cortical material was removed with the irrigation-aspiration handpiece. The capsular bag was inflated with viscoelastic and the Tecnis ZCB00, 20.0-diopter lens, serial number [id number], was placed in the capsular bag without complication. The remaining viscoelastic was removed from the eye with the irrigation-aspiration handpiece. The wounds were hydrated. The anterior chamber was flushed with Miostat and the eye was inflated to  physiologic pressure. 0.1 mL of cefuroxime concentration 10 mg/mL was placed in the anterior chamber. The wounds were found to be water tight.   The eye was dressed with Vigamox. The patient was given protective glasses to wear throughout the day and a shield with which to sleep tonight. The patient was also given drops with which to begin a drop regimen today and will follow-up with me in one day.   ____________________________ Livingston Diones. Morghan Kester, MD wlp:cs D: 05/07/2014 00:34:91 ET T: 05/07/2014 19:39:47 ET JOB#: [id number]  cc: Karlo Goeden L. Durga Saldarriaga, MD, <Dictator> Livingston Diones Shristi Scheib MD ELECTRONICALLY SIGNED 05/15/2014 13:43

## 2015-04-05 NOTE — Op Note (Signed)
PATIENT NAME:  BANNER, Matthew [name] MR#:  [id number] DATE OF BIRTH:  1936/08/02  DATE OF PROCEDURE:  02/28/2014   PREOPERATIVE DIAGNOSES: 1. Peripheral arterial disease with claudication.  2. Coronary disease, requiring catheterization.  3. Severe hypertension.   POSTOPERATIVE DIAGNOSES: 1. Peripheral arterial disease with claudication.  2. Coronary disease, requiring catheterization.  3. Severe hypertension.   PROCEDURE: Ultrasound guidance for vascular access to right femoral artery and catheter placement to aorta.   SURGEON: Algernon Huxley, MD   ANESTHESIA: Local with moderate conscious sedation.   ESTIMATED BLOOD LOSS: Minimal.   INDICATION FOR PROCEDURE AND BRIEF HISTORY: This gentleman is on the cardiac catheterization table, they have had difficulty getting access. He has known peripheral arterial disease, and his cardiologist reports short distance claudication symptoms. We are asked to help obtain access.   DESCRIPTION OF PROCEDURE: The patient's right groin was already sterilely prepped and draped. I visualized the femoral artery, which was significantly diseased. This was accessed with the micropuncture needle and under direct ultrasound guidance without difficulty and a micropuncture wire was then placed. A micropuncture sheath was then placed and we upsized to a 5 [place] sheath. With this, a Glidewire and Kumpe catheter could be placed into the aorta.  I then turned the field over to Dr. Rockey Situ for the cardiac portion of the procedure.    ____________________________ Algernon Huxley, MD [phone] D: 02/28/2014 15:47:19 ET T: 02/28/2014 21:42:30 ET JOB#: [id number]  cc: Algernon Huxley, MD, <Dictator> Algernon Huxley MD ELECTRONICALLY SIGNED 03/18/2014 9:46

## 2015-04-05 NOTE — Op Note (Signed)
PATIENT NAME:  Matthew [name], Matthew [name] MR#:  [id number] DATE OF BIRTH:  [birth year]  DATE OF PROCEDURE:  07/16/2014  PREOPERATIVE DIAGNOSIS: Visually significant cataract of the left eye.   POSTOPERATIVE DIAGNOSIS: Visually significant cataract of the left eye.   OPERATIVE PROCEDURE: Cataract extraction by phacoemulsification with implant of intraocular lens to left eye.   SURGEON: Birder Robson, MD.   ANESTHESIA:  1. Managed anesthesia care.  2. Topical tetracaine drops followed by 2% Xylocaine jelly applied in the preoperative holding area.   COMPLICATIONS: None.   TECHNIQUE:  Stop and chop.  DESCRIPTION OF PROCEDURE: The patient was examined and consented in the preoperative holding area where the aforementioned topical anesthesia was applied to the left eye and then brought back to the Operating Room where the left eye was prepped and draped in the usual sterile ophthalmic fashion and a lid speculum was placed. A paracentesis was created with the side port blade and the anterior chamber was filled with viscoelastic. A near clear corneal incision was performed with the steel keratome. A continuous curvilinear capsulorrhexis was performed with a cystotome followed by the capsulorrhexis forceps. Hydrodissection and hydrodelineation were carried out with BSS on a blunt cannula. The lens was removed in a stop and chop  technique and the remaining cortical material was removed with the irrigation-aspiration handpiece. The capsular bag was inflated with viscoelastic and the Tecnis ZCB00 19.5-diopter lens, serial number [id number] was placed in the capsular bag without complication. The remaining viscoelastic was removed from the eye with the irrigation-aspiration handpiece. The wounds were hydrated. The anterior chamber was flushed with Miostat and the eye was inflated to physiologic pressure. 0.1 mL of cefuroxime concentration 10 mg/mL was placed in the anterior chamber. The wounds were found to be water  tight. The eye was dressed with Vigamox. The patient was given protective glasses to wear throughout the day and a shield with which to sleep tonight. The patient was also given drops with which to begin a drop regimen today and will follow-up with me in one day.    ____________________________ Livingston Diones. Howell Groesbeck, MD wlp:jh D: 07/16/2014 22:50:37 ET T: 07/17/2014 05:25:54 ET JOB#: [id number]  cc: Carlie Solorzano L. Erman Thum, MD, <Dictator> Livingston Diones Bralee Feldt MD ELECTRONICALLY SIGNED 07/17/2014 16:34

## 2015-04-14 ENCOUNTER — Other Ambulatory Visit: Payer: Self-pay | Admitting: Cardiovascular Disease

## 2015-04-14 DIAGNOSIS — I739 Peripheral vascular disease, unspecified: Secondary | ICD-10-CM

## 2015-04-29 ENCOUNTER — Ambulatory Visit (INDEPENDENT_AMBULATORY_CARE_PROVIDER_SITE_OTHER): Payer: Medicare Other

## 2015-04-29 DIAGNOSIS — I739 Peripheral vascular disease, unspecified: Secondary | ICD-10-CM | POA: Diagnosis not present

## 2015-05-02 ENCOUNTER — Other Ambulatory Visit: Payer: Medicare Other

## 2015-05-05 ENCOUNTER — Telehealth: Payer: Self-pay | Admitting: Cardiovascular Disease

## 2015-05-05 ENCOUNTER — Other Ambulatory Visit: Payer: Self-pay

## 2015-05-05 DIAGNOSIS — I25810 Atherosclerosis of coronary artery bypass graft(s) without angina pectoris: Secondary | ICD-10-CM

## 2015-05-05 NOTE — Telephone Encounter (Signed)
S/w pt regarding test results and instructed to repeat in 6 months. Pt verbalized understanding with no further questions.

## 2015-05-05 NOTE — Telephone Encounter (Signed)
Patient wants test results.  Please call back.  patient had duplex on 04-29-15.

## 2015-06-09 ENCOUNTER — Other Ambulatory Visit: Payer: Self-pay | Admitting: Cardiovascular Disease

## 2015-07-01 ENCOUNTER — Telehealth: Payer: Self-pay | Admitting: *Deleted

## 2015-07-01 ENCOUNTER — Ambulatory Visit (INDEPENDENT_AMBULATORY_CARE_PROVIDER_SITE_OTHER): Payer: Medicare Other | Admitting: Cardiovascular Disease

## 2015-07-01 ENCOUNTER — Encounter: Payer: Self-pay | Admitting: Cardiovascular Disease

## 2015-07-01 VITALS — BP 129/75 | HR 78 | Ht 69.0 in | Wt 191.0 lb

## 2015-07-01 DIAGNOSIS — I159 Secondary hypertension, unspecified: Secondary | ICD-10-CM

## 2015-07-01 DIAGNOSIS — I359 Nonrheumatic aortic valve disorder, unspecified: Secondary | ICD-10-CM

## 2015-07-01 DIAGNOSIS — I350 Nonrheumatic aortic (valve) stenosis: Secondary | ICD-10-CM | POA: Diagnosis not present

## 2015-07-01 DIAGNOSIS — I10 Essential (primary) hypertension: Secondary | ICD-10-CM

## 2015-07-01 DIAGNOSIS — I25118 Atherosclerotic heart disease of native coronary artery with other forms of angina pectoris: Secondary | ICD-10-CM | POA: Diagnosis not present

## 2015-07-01 DIAGNOSIS — I739 Peripheral vascular disease, unspecified: Secondary | ICD-10-CM

## 2015-07-01 NOTE — Telephone Encounter (Signed)
Patient's daughter Matthew [name] called and wanted to make sure the doctor knew patient had a couple of episode of chest pains with severe sweating. Patient has an appt today 07/01/15 with Dr. Fletcher Anon at 2:00. Thanks

## 2015-07-01 NOTE — Patient Instructions (Signed)
Medication Instructions:  Your physician recommends that you continue on your current medications as directed. Please refer to the Current Medication list given to you today.   Labwork: none  Testing/Procedures: Your physician has requested that you have an echocardiogram. Echocardiography is a painless test that uses sound waves to create images of your heart. It provides your doctor with information about the size and shape of your heart and how well your heart's chambers and valves are working. This procedure takes approximately one hour. There are no restrictions for this procedure.    Follow-Up: Your physician recommends that you schedule a follow-up appointment based on echo results. We will call you after echocardiogram.   Any Other Special Instructions Will Be Listed Below (If Applicable).

## 2015-07-01 NOTE — Assessment & Plan Note (Signed)
The patient is having worsening anginal symptoms. I think the first of this to make sure that his aortic stenosis has not worsened. Thus, I requested an echocardiogram. If aortic stenosis is severe, then the next step is to proceed with a right and left cardiac catheterization. If aortic stenosis is not severe yet, then we should proceed with further ischemic cardiac evaluation likely a pharmacologic nuclear stress test. If cardiac catheterization is needed, would have to be done via the femoral approach given that she has an absent left radial pulse and previous attempt via the right radial artery was not successful due to severe tortuosity.

## 2015-07-01 NOTE — Assessment & Plan Note (Signed)
No further claudication after bilateral iliac stenting.  Femoral pulses are normal bilaterally.

## 2015-07-01 NOTE — Progress Notes (Signed)
Primary cardiologist: Dr. Rockey Situ  HPI  Matthew [name] is a 79 yo with h/o CAD, MI in 1996,  s/p CABG 1996 with redo 2004, moderate aortic stenosis, PAD status post bilateral iliac kissing stent placement, HTN and hyperlipidemia.  He was seen in March of 2015 by Dr. Rockey Situ for chest pain worrisome for angina. Cardiac catheterization was attempted via the right femoral artery. However, he was found to have subtotally occluded common iliac artery with inability to advance the catheters. Femoral pulse was also weak on the left side. I attempted catheterization via the right radial artery but he was found to have significant tortuosity in the innominate artery with inability torque catheters. Left radial artery pulses absent. He reported significant bilateral buttock and leg claudication. I proceeded with cardiac catheterization via the left femoral artery at [facility] which showed patent grafts with normal ejection fraction. There was moderate aortic stenosis. He was found to have subtotally occluded right common iliac artery and 70% stenosis in the left common iliac artery. I performed successful bilateral kissing stent placement without complications. Claudication resolved completely.  Most recent duplex in May showed patent common iliac artery stents. There was moderate stenosis affecting bilateral external iliac arteries. He was admitted to my schedule today due to recent symptoms of substernal chest pain happening mainly when he overexerts himself. He had a prolonged episode after mawing the lawn last week. He also had intermittent milder episodes with less intense activities. This has been associated with shortness of breath. He has been working in the hot weather lately.  No Known Allergies   Current Outpatient Prescriptions on File Prior to Visit  Medication Sig Dispense Refill  . acetaminophen (TYLENOL) 325 MG tablet Take 650 mg by mouth daily as needed.    Marland Kitchen aspirin EC 81 MG tablet Take 1 tablet (81  mg total) by mouth daily. 90 tablet 3  . atorvastatin (LIPITOR) 80 MG tablet Take 80 mg by mouth daily.    . clopidogrel (PLAVIX) 75 MG tablet Take 1 tablet (75 mg total) by mouth daily. 30 tablet 6  . hydrochlorothiazide (HYDRODIURIL) 25 MG tablet Take 25 mg by mouth daily. TAKE ONE TABLET BY MOUTH EVERY DAY    . isosorbide dinitrate (ISORDIL) 10 MG tablet TAKE ONE TABLET BY MOUTH THREE TIMES DAILY 270 tablet 3  . lisinopril (PRINIVIL,ZESTRIL) 20 MG tablet Take 20 mg by mouth daily.    . nitroGLYCERIN (NITROSTAT) 0.4 MG SL tablet Place 0.4 mg under the tongue every 5 (five) minutes as needed.    Marland Kitchen omeprazole (PRILOSEC) 40 MG capsule TAKE ONE CAPSULE BY MOUTH ONCE DAILY 90 capsule 3  . tamsulosin (FLOMAX) 0.4 MG CAPS capsule Take 1 capsule (0.4 mg total) by mouth daily. 30 capsule 3   No current facility-administered medications on file prior to visit.     Past Medical History  Diagnosis Date  . Hyperlipidemia   . HTN (hypertension)   . Nephrolithiasis   . AS (aortic stenosis)     mild  . Carotid bruit     2009 0-39% on dopplers bilatrally  . OSA (obstructive sleep apnea)   . Myocardial infarction 1996  . Shortness of breath   . GERD (gastroesophageal reflux disease)   . HOH (hard of hearing)   . History of kidney stones   . CAD (coronary artery disease)     MI 57 with CABG 1996. Redo CABG 06/2003.Marland Kitchen Myoview 7/09 EF 54% inferobasal infarct. no ischemia. Myoview 6/10 EF 43% inferior wall infarct.  no ischemai. Echo 6/10: EF 55% mild AS mean 7.  . PAD (peripheral artery disease) 02/2014    Subtotal occlusion of right common iliac artery and 70% stenosis in the left common iliac artery. Status post bilateral kissing stent placement. Significant post stenosis aneurysmal dilatation on the right side (any future catheterization through the right femoral artery should be done cautiously to avoid advancing the wire behind the stent struts)     Past Surgical History  Procedure Laterality  Date  . Stomach surgery      removal of gastric ulcers  . Coronary artery bypass graft  1991    at [facility]. Redone 2004-3 vessels 1st time and 4 second time  . Inguinal hernia repair  11/10/2011    Procedure: HERNIA REPAIR INGUINAL ADULT;  Surgeon: Donato Heinz;  Location: [facility];  Service: General;  Laterality: Right;  . Cardiac catheterization  02/2014    Severe three-vessel coronary artery disease with patent grafts.   . Left heart catheterization with coronary/graft angiogram N/A 03/06/2014    Procedure: LEFT HEART CATHETERIZATION WITH Beatrix Fetters;  Surgeon: Wellington Hampshire, MD;  Location: [facility] CATH LAB;  Service: Cardiovascular;  Laterality: N/A;  . Abdominal aortagram N/A 03/06/2014    Procedure: ABDOMINAL Maxcine Ham;  Surgeon: Wellington Hampshire, MD;  Location: [facility] CATH LAB;  Service: Cardiovascular;  Laterality: N/A;     Family History  Problem Relation Age of Onset  . Heart disease Father   . Heart disease Mother      History   Social History  . Marital Status: Married    Spouse Name: N/A  . Number of Children: N/A  . Years of Education: N/A   Occupational History  . Not on file.   Social History Main Topics  . Smoking status: Former Smoker -- 1.00 packs/day for 50 years    Types: Cigarettes    Quit date: 01/24/1990  . Smokeless tobacco: Not on file     Comment: quit in early 90's  . Alcohol Use: No  . Drug Use: No  . Sexual Activity: Not on file   Other Topics Concern  . Not on file   Social History Narrative   Married an lives with wife in [place]. Retired form [employer].       PHYSICAL EXAM   BP 129/75 mmHg  Pulse 78  Ht 5\' 9"  (1.753 m)  Wt 191 lb (86.637 kg)  BMI 28.19 kg/m2 Constitutional: He is oriented to person, place, and time. He appears well-developed and well-nourished. No distress.  HENT: No nasal discharge.  Head: Normocephalic and atraumatic.  Eyes: Pupils are equal and round.  No discharge. Neck: Normal range  of motion. Neck supple. No JVD present. No thyromegaly present.  Cardiovascular: Normal rate, regular rhythm, normal heart sounds. Exam reveals no gallop and no friction rub. There is a 3/6 systolic ejection murmur at the aortic area which is late peaking. S2 is mildly diminished.  Pulmonary/Chest: Effort normal and breath sounds normal. No stridor. No respiratory distress. He has no wheezes. He has no rales. He exhibits no tenderness.  Abdominal: Soft. Bowel sounds are normal. He exhibits no distension. There is no tenderness. There is no rebound and no guarding.  Musculoskeletal: Normal range of motion. He exhibits no edema and no tenderness.  Neurological: He is alert and oriented to person, place, and time. Coordination normal.  Skin: Skin is warm and dry. No rash noted. He is not diaphoretic. No erythema. No pallor.  Psychiatric:  He has a normal mood and affect. His behavior is normal. Judgment and thought content normal.  Vascular: Femoral pulses normal bilaterally. Distal pulses are palpable.     EKG: Sinus rhythm with PACs and PVCs, left ventricular hypertrophy with poor R-wave progression. Old inferior infarct. No significant change from most recent EKG    ASSESSMENT AND PLAN

## 2015-07-01 NOTE — Telephone Encounter (Signed)
Patient is currently seeing Dr. Fletcher Anon.

## 2015-07-01 NOTE — Assessment & Plan Note (Signed)
Further workup as outlined above. Most recent cardiac catheterization showed patent grafts.

## 2015-07-01 NOTE — Assessment & Plan Note (Signed)
Blood pressure is controlled on current medications. 

## 2015-07-07 ENCOUNTER — Other Ambulatory Visit: Payer: Self-pay

## 2015-07-07 ENCOUNTER — Ambulatory Visit (INDEPENDENT_AMBULATORY_CARE_PROVIDER_SITE_OTHER): Payer: Medicare Other

## 2015-07-07 DIAGNOSIS — I350 Nonrheumatic aortic (valve) stenosis: Secondary | ICD-10-CM

## 2015-07-09 ENCOUNTER — Other Ambulatory Visit: Payer: Self-pay | Admitting: Cardiovascular Disease

## 2015-07-09 ENCOUNTER — Other Ambulatory Visit: Payer: Self-pay

## 2015-07-09 DIAGNOSIS — I350 Nonrheumatic aortic (valve) stenosis: Secondary | ICD-10-CM

## 2015-07-09 NOTE — Patient Instructions (Signed)
Your physician has requested that you have a lexiscan myoview.. Please follow instruction sheet, as given.  [place]  Your caregiver has ordered a Stress Test with nuclear imaging. The purpose of this test is to evaluate the blood supply to your heart muscle. This procedure is referred to as a "Non-Invasive Stress Test." This is because other than having an IV started in your vein, nothing is inserted or "invades" your body. Cardiac stress tests are done to find areas of poor blood flow to the heart by determining the extent of coronary artery disease (CAD). Some patients exercise on a treadmill, which naturally increases the blood flow to your heart, while others who are  unable to walk on a treadmill due to physical limitations have a pharmacologic/chemical stress agent called Lexiscan . This medicine will mimic walking on a treadmill by temporarily increasing your coronary blood flow.   Please note: these test may take anywhere between 2-4 hours to complete  PLEASE REPORT TO [facility] AT THE FIRST DESK WILL DIRECT YOU WHERE TO GO  Date of Procedure: Friday, August 5, 7:30am  Arrival Time for Procedure: 6:45am  Instructions regarding medication:     _xx___:  Do not take metoprolol the night before procedure and morning of procedure  _ PLEASE NOTIFY THE OFFICE AT LEAST 24 HOURS IN ADVANCE IF YOU ARE UNABLE TO KEEP YOUR APPOINTMENT.  [phone] AND  PLEASE NOTIFY NUCLEAR MEDICINE AT [facility] AT LEAST 24 HOURS IN ADVANCE IF YOU ARE UNABLE TO KEEP YOUR APPOINTMENT. [phone]  How to prepare for your Myoview test:   Do not eat or drink after midnight  No caffeine for 24 hours prior to test  No smoking 24 hours prior to test.  Your medication may be taken with water.  If your doctor stopped a medication because of this test, do not take that medication.  Ladies, please do not wear dresses.  Skirts or pants are appropriate. Please wear a short  sleeve shirt.  No perfume, cologne or lotion.  Wear comfortable walking shoes. No heels!          Nuclear Medicine Exam A nuclear medicine exam is a safe and painless imaging test. It helps to detect and diagnose disease in the body as well as provide information about organ function and structure.  Nuclear scans are most often done of the:  Lungs.  Heart.  Thyroid gland.  Bones.  Abdomen. HOW A NUCLEAR MEDICINE EXAM WORKS A nuclear medicine exam works by using a radioactive tracer. The material is given either by an IV (intravenous) injection or it may be swallowed. After the tracer is in the body, it is absorbed by your body's organs. A large scanning machine that uses a special camera detects the radioactivity in your body. A computerized image is then formed regarding the area of concern. The small amounts of radioactive material used in a nuclear medicine exam are found to be medically safe. However, because radioactive material is used, this test is not done if you are pregnant or nursing.  BEFORE THE PROCEDURE  If available, bring previous imaging studies such as x-rays, etc. with you to the exam.  Arrive early for your exam. PROCEDURE  An IV may be started before the exam begins.  Depending on the type of examination, will lie on a table or sit in a chair during the exam.  The nuclear medicine exam will take about 30 to 60 minutes to complete. AFTER THE  PROCEDURE  After your scan is completed, the image(s) will be evaluated by a specialist. It is important that you follow up with your caregiver to find out your test results.  You may return to your regular activity as instructed by your caregiver. SEEK IMMEDIATE MEDICAL CARE IF: You have shortness of breath or difficulty breathing. MAKE SURE YOU:   Understand these instructions.  Will watch your condition.  Will get help right away if you are not doing well or get worse. Document Released: 01/06/2005  Document Revised: 02/21/2012 Document Reviewed: 02/20/2009 [facility] Patient Information 2015 [place], [place]. This information is not intended to replace advice given to you by your health care provider. Make sure you discuss any questions you have with your health care provider.

## 2015-07-18 ENCOUNTER — Encounter
Admission: RE | Admit: 2015-07-18 | Discharge: 2015-07-18 | Disposition: A | Discharge status: Home / Self Care | Payer: Medicare Other | Source: Ambulatory Visit | Attending: Cardiovascular Disease | Admitting: Cardiovascular Disease

## 2015-07-18 DIAGNOSIS — I350 Nonrheumatic aortic (valve) stenosis: Secondary | ICD-10-CM | POA: Diagnosis not present

## 2015-07-18 LAB — NM MYOCAR MULTI W/SPECT W/WALL MOTION / EF
CHL CUP NUCLEAR SDS: 1
CHL CUP NUCLEAR SRS: 6
CHL CUP NUCLEAR SSS: 2
LVDIAVOL: 111 mL
LVSYSVOL: 52 mL
NUC STRESS TID: 0.93
Rest HR: 57 {beats}/min

## 2015-07-18 MED ORDER — TECHNETIUM TC 99M SESTAMIBI - CARDIOLITE
31.3900 | Freq: Once | INTRAVENOUS | Status: AC | PRN
  Administered 2015-07-18: 31.39 via INTRAVENOUS

## 2015-07-18 MED ORDER — REGADENOSON 0.4 MG/5ML IV SOLN
0.4000 mg | Freq: Once | INTRAVENOUS | Status: AC
  Administered 2015-07-18: 0.4 mg via INTRAVENOUS

## 2015-07-18 MED ORDER — TECHNETIUM TC 99M SESTAMIBI - CARDIOLITE
14.0700 | Freq: Once | INTRAVENOUS | Status: AC | PRN
  Administered 2015-07-18: 14.07 via INTRAVENOUS

## 2015-07-22 ENCOUNTER — Telehealth: Payer: Self-pay | Admitting: Cardiovascular Disease

## 2015-07-22 NOTE — Telephone Encounter (Signed)
Patient wife returning call for nm results.  Please call again .  Patient cannot here.  Give results to wife.  She works 7-4 .  Will be home at 430 Tomorrow.

## 2015-07-23 NOTE — Telephone Encounter (Signed)
Daughter [name] called for test results. Reviewed with daughter. Daughter will discuss with pt and CB. See result note.

## 2015-07-24 ENCOUNTER — Telehealth: Payer: Self-pay | Admitting: Cardiovascular Disease

## 2015-07-24 NOTE — Telephone Encounter (Signed)
Pt agreeable to cath for CP/SOB.  Forward to [place].

## 2015-07-24 NOTE — Telephone Encounter (Signed)
Schedule a right and left heart cath +grafts and possible PCI.  I can do on the 16 or 17th at [facility] or the 18th at [facility].

## 2015-07-24 NOTE — Telephone Encounter (Signed)
S/w daughter Jamey Ripa, regarding pt phone call stating agreeable to cath. Informed [name] that I have made Dr. Fletcher Anon aware and am awaiting response from him. Will call her with details. Daughter verbalized understanding with no further questions at this time.

## 2015-07-24 NOTE — Telephone Encounter (Signed)
Patient called to schedule cath ashe is still having cp and sob.  Please call Daughter Matthew [name] to schedule as pt is very  HOH.   Patient wants to cath on Friday.

## 2015-07-25 ENCOUNTER — Other Ambulatory Visit: Payer: Self-pay

## 2015-07-25 ENCOUNTER — Telehealth: Payer: Self-pay

## 2015-07-25 DIAGNOSIS — Z01812 Encounter for preprocedural laboratory examination: Secondary | ICD-10-CM

## 2015-07-25 NOTE — Telephone Encounter (Signed)
Cardiac cath packet and lab orders left at front desk for patient pickup

## 2015-07-25 NOTE — Patient Instructions (Signed)
[facility] Cardiac Cath Instructions   You are scheduled for a Cardiac Cath on: Thursday, August 18, 8:00am  Please arrive at 6:30am on the day of your procedure  Do not eat/drink anything after midnight  Someone will need to drive you home  It is recommended someone be with you for the first 24 hours after your procedure  Wear clothes that are easy to get on/off and wear slip on shoes if possible   Medications bring a current list of all medications with you  You may take all of your medications the morning of your procedure with enough water to swallow safely   Day of your procedure: Arrive at the [facility] entrance.  Free valet service is available.  After entering the [facility] please check-in at the registration desk (1st desk on your right) to receive your armband. After receiving your armband someone will escort you to the cardiac cath/special procedures waiting area.  The usual length of stay after your procedure is about 2 to 3 hours.  This can vary.  If you have any questions, please call our office at [phone], or you may call the cardiac cath lab at [facility] directly at [phone]

## 2015-07-25 NOTE — Telephone Encounter (Signed)
S/w pt daughter who states pt is agreeable to heart cath on August 18th Pt will come to office Monday or Tuesday for packet of instructions along with labs and CXR order. Pt daughter verbalized understanding and states she will relay information to pt and pt wife Daughter had no further questions.

## 2015-07-25 NOTE — Telephone Encounter (Signed)
S/w pt daughter [name] regarding her question out of pocket expense Per Earlyne Iba., pt should call insurance company to inquire using codes [phone] and [phone] [name] verbalized understanding

## 2015-07-28 ENCOUNTER — Ambulatory Visit
Admission: RE | Admit: 2015-07-28 | Discharge: 2015-07-28 | Disposition: A | Discharge status: Home / Self Care | Payer: Medicare Other | Source: Ambulatory Visit | Attending: Cardiovascular Disease | Admitting: Cardiovascular Disease

## 2015-07-28 DIAGNOSIS — E785 Hyperlipidemia, unspecified: Secondary | ICD-10-CM | POA: Diagnosis not present

## 2015-07-28 DIAGNOSIS — Z87442 Personal history of urinary calculi: Secondary | ICD-10-CM | POA: Diagnosis not present

## 2015-07-28 DIAGNOSIS — Z87891 Personal history of nicotine dependence: Secondary | ICD-10-CM | POA: Diagnosis not present

## 2015-07-28 DIAGNOSIS — Z7902 Long term (current) use of antithrombotics/antiplatelets: Secondary | ICD-10-CM | POA: Diagnosis not present

## 2015-07-28 DIAGNOSIS — Z7982 Long term (current) use of aspirin: Secondary | ICD-10-CM | POA: Diagnosis not present

## 2015-07-28 DIAGNOSIS — I25119 Atherosclerotic heart disease of native coronary artery with unspecified angina pectoris: Secondary | ICD-10-CM | POA: Diagnosis not present

## 2015-07-28 DIAGNOSIS — Z01812 Encounter for preprocedural laboratory examination: Secondary | ICD-10-CM | POA: Insufficient documentation

## 2015-07-28 DIAGNOSIS — Z79899 Other long term (current) drug therapy: Secondary | ICD-10-CM | POA: Diagnosis not present

## 2015-07-28 DIAGNOSIS — K219 Gastro-esophageal reflux disease without esophagitis: Secondary | ICD-10-CM | POA: Diagnosis not present

## 2015-07-28 DIAGNOSIS — I252 Old myocardial infarction: Secondary | ICD-10-CM | POA: Diagnosis not present

## 2015-07-28 DIAGNOSIS — D696 Thrombocytopenia, unspecified: Secondary | ICD-10-CM | POA: Diagnosis not present

## 2015-07-28 DIAGNOSIS — G4733 Obstructive sleep apnea (adult) (pediatric): Secondary | ICD-10-CM | POA: Diagnosis not present

## 2015-07-28 DIAGNOSIS — Z951 Presence of aortocoronary bypass graft: Secondary | ICD-10-CM | POA: Diagnosis not present

## 2015-07-28 DIAGNOSIS — I2510 Atherosclerotic heart disease of native coronary artery without angina pectoris: Secondary | ICD-10-CM | POA: Diagnosis present

## 2015-07-28 DIAGNOSIS — Z9889 Other specified postprocedural states: Secondary | ICD-10-CM | POA: Diagnosis not present

## 2015-07-28 DIAGNOSIS — I739 Peripheral vascular disease, unspecified: Secondary | ICD-10-CM | POA: Diagnosis not present

## 2015-07-28 DIAGNOSIS — I10 Essential (primary) hypertension: Secondary | ICD-10-CM | POA: Diagnosis not present

## 2015-07-28 DIAGNOSIS — Z8249 Family history of ischemic heart disease and other diseases of the circulatory system: Secondary | ICD-10-CM | POA: Diagnosis not present

## 2015-07-28 DIAGNOSIS — I359 Nonrheumatic aortic valve disorder, unspecified: Secondary | ICD-10-CM | POA: Diagnosis not present

## 2015-07-28 DIAGNOSIS — I350 Nonrheumatic aortic (valve) stenosis: Secondary | ICD-10-CM | POA: Diagnosis not present

## 2015-07-28 DIAGNOSIS — H9190 Unspecified hearing loss, unspecified ear: Secondary | ICD-10-CM | POA: Diagnosis not present

## 2015-07-28 LAB — BASIC METABOLIC PANEL
ANION GAP: 7 (ref 5–15)
BUN: 18 mg/dL (ref 6–20)
CO2: 25 mmol/L (ref 22–32)
CREATININE: 1.16 mg/dL (ref 0.61–1.24)
Calcium: 8.9 mg/dL (ref 8.9–10.3)
Chloride: 103 mmol/L (ref 101–111)
GFR calc Af Amer: >60 mL/min (ref >60)
GFR calc non Af Amer: 59 mL/min — ABNORMAL LOW (ref >60)
GLUCOSE: 101 mg/dL — AB (ref 65–99)
Potassium: 3.9 mmol/L (ref 3.5–5.1)
Sodium: 135 mmol/L (ref 135–145)

## 2015-07-28 LAB — PROTIME-INR
INR: 1.06
Prothrombin Time: 14 seconds (ref 11.4–15.0)

## 2015-07-28 LAB — CBC
HEMATOCRIT: 42.4 % (ref 40.0–52.0)
Hemoglobin: 14.4 g/dL (ref 13.0–18.0)
MCH: 30.4 pg (ref 26.0–34.0)
MCHC: 34.1 g/dL (ref 32.0–36.0)
MCV: 89.2 fL (ref 80.0–100.0)
Platelets: 116 10*3/uL — ABNORMAL LOW (ref 150–440)
RBC: 4.76 MIL/uL (ref 4.40–5.90)
RDW: 13.9 % (ref 11.5–14.5)
WBC: 7.3 10*3/uL (ref 3.8–10.6)

## 2015-07-31 ENCOUNTER — Encounter: Payer: Self-pay | Admitting: Physician Assistant

## 2015-07-31 ENCOUNTER — Observation Stay
Admission: AD | Admit: 2015-07-31 | Discharge: 2015-08-01 | Disposition: A | Discharge status: Home / Self Care | Payer: Medicare Other | Source: Ambulatory Visit | Attending: Nurse Practitioner | Admitting: Nurse Practitioner

## 2015-07-31 ENCOUNTER — Encounter
Admission: AD | Disposition: A | Discharge status: Home / Self Care | Payer: Self-pay | Source: Ambulatory Visit | Attending: Cardiovascular Disease

## 2015-07-31 DIAGNOSIS — I208 Other forms of angina pectoris: Secondary | ICD-10-CM | POA: Diagnosis present

## 2015-07-31 DIAGNOSIS — I359 Nonrheumatic aortic valve disorder, unspecified: Secondary | ICD-10-CM | POA: Insufficient documentation

## 2015-07-31 DIAGNOSIS — I25119 Atherosclerotic heart disease of native coronary artery with unspecified angina pectoris: Principal | ICD-10-CM | POA: Insufficient documentation

## 2015-07-31 DIAGNOSIS — Z8249 Family history of ischemic heart disease and other diseases of the circulatory system: Secondary | ICD-10-CM | POA: Insufficient documentation

## 2015-07-31 DIAGNOSIS — Z9889 Other specified postprocedural states: Secondary | ICD-10-CM | POA: Insufficient documentation

## 2015-07-31 DIAGNOSIS — I10 Essential (primary) hypertension: Secondary | ICD-10-CM | POA: Insufficient documentation

## 2015-07-31 DIAGNOSIS — K219 Gastro-esophageal reflux disease without esophagitis: Secondary | ICD-10-CM | POA: Insufficient documentation

## 2015-07-31 DIAGNOSIS — G4733 Obstructive sleep apnea (adult) (pediatric): Secondary | ICD-10-CM | POA: Insufficient documentation

## 2015-07-31 DIAGNOSIS — E785 Hyperlipidemia, unspecified: Secondary | ICD-10-CM | POA: Insufficient documentation

## 2015-07-31 DIAGNOSIS — Z87891 Personal history of nicotine dependence: Secondary | ICD-10-CM | POA: Insufficient documentation

## 2015-07-31 DIAGNOSIS — I739 Peripheral vascular disease, unspecified: Secondary | ICD-10-CM | POA: Insufficient documentation

## 2015-07-31 DIAGNOSIS — Z87442 Personal history of urinary calculi: Secondary | ICD-10-CM | POA: Insufficient documentation

## 2015-07-31 DIAGNOSIS — Z79899 Other long term (current) drug therapy: Secondary | ICD-10-CM | POA: Insufficient documentation

## 2015-07-31 DIAGNOSIS — D696 Thrombocytopenia, unspecified: Secondary | ICD-10-CM | POA: Insufficient documentation

## 2015-07-31 DIAGNOSIS — Z951 Presence of aortocoronary bypass graft: Secondary | ICD-10-CM | POA: Insufficient documentation

## 2015-07-31 DIAGNOSIS — H9190 Unspecified hearing loss, unspecified ear: Secondary | ICD-10-CM | POA: Insufficient documentation

## 2015-07-31 DIAGNOSIS — I25718 Atherosclerosis of autologous vein coronary artery bypass graft(s) with other forms of angina pectoris: Secondary | ICD-10-CM | POA: Diagnosis not present

## 2015-07-31 DIAGNOSIS — I252 Old myocardial infarction: Secondary | ICD-10-CM | POA: Insufficient documentation

## 2015-07-31 DIAGNOSIS — Z7902 Long term (current) use of antithrombotics/antiplatelets: Secondary | ICD-10-CM | POA: Insufficient documentation

## 2015-07-31 DIAGNOSIS — I2510 Atherosclerotic heart disease of native coronary artery without angina pectoris: Secondary | ICD-10-CM | POA: Diagnosis present

## 2015-07-31 DIAGNOSIS — I350 Nonrheumatic aortic (valve) stenosis: Secondary | ICD-10-CM | POA: Insufficient documentation

## 2015-07-31 DIAGNOSIS — Z7982 Long term (current) use of aspirin: Secondary | ICD-10-CM | POA: Insufficient documentation

## 2015-07-31 HISTORY — PX: CARDIAC CATHETERIZATION: SHX172

## 2015-07-31 SURGERY — RIGHT/LEFT HEART CATH AND CORONARY/GRAFT ANGIOGRAPHY

## 2015-07-31 MED ORDER — LISINOPRIL 10 MG PO TABS
20.0000 mg | ORAL_TABLET | Freq: Every day | ORAL | Status: DC
  Administered 2015-08-01: 20 mg via ORAL
  Filled 2015-07-31: qty 2

## 2015-07-31 MED ORDER — SODIUM CHLORIDE 0.9 % WEIGHT BASED INFUSION
3.0000 mL/kg/h | INTRAVENOUS | Status: DC
  Administered 2015-07-31: 3 mL/kg/h via INTRAVENOUS

## 2015-07-31 MED ORDER — CLOPIDOGREL BISULFATE 75 MG PO TABS
ORAL_TABLET | ORAL | Status: AC
  Filled 2015-07-31: qty 4

## 2015-07-31 MED ORDER — ISOSORBIDE DINITRATE 10 MG PO TABS
10.0000 mg | ORAL_TABLET | Freq: Three times a day (TID) | ORAL | Status: DC
  Administered 2015-07-31 – 2015-08-01 (×2): 10 mg via ORAL
  Filled 2015-07-31 (×2): qty 1

## 2015-07-31 MED ORDER — BIVALIRUDIN 250 MG IV SOLR
250.0000 mg | INTRAVENOUS | Status: DC | PRN
  Administered 2015-07-31: 1.75 mg/kg/h via INTRAVENOUS

## 2015-07-31 MED ORDER — HEPARIN (PORCINE) IN NACL 2-0.9 UNIT/ML-% IJ SOLN
INTRAMUSCULAR | Status: AC
  Filled 2015-07-31: qty 1000

## 2015-07-31 MED ORDER — ASPIRIN 81 MG PO CHEW
CHEWABLE_TABLET | ORAL | Status: DC | PRN
  Administered 2015-07-31: 243 mg via ORAL

## 2015-07-31 MED ORDER — MIDAZOLAM HCL 2 MG/2ML IJ SOLN
INTRAMUSCULAR | Status: DC | PRN
  Administered 2015-07-31: 0.5 mg via INTRAVENOUS
  Administered 2015-07-31: 1 mg via INTRAVENOUS
  Administered 2015-07-31: 0.5 mg via INTRAVENOUS

## 2015-07-31 MED ORDER — ASPIRIN 81 MG PO CHEW
CHEWABLE_TABLET | ORAL | Status: AC
  Filled 2015-07-31: qty 3

## 2015-07-31 MED ORDER — SODIUM CHLORIDE 0.9 % WEIGHT BASED INFUSION: 1.0000 mL/kg/h | INTRAVENOUS | Status: DC

## 2015-07-31 MED ORDER — ONDANSETRON HCL 4 MG/2ML IJ SOLN: 4.0000 mg | Freq: Four times a day (QID) | INTRAMUSCULAR | Status: DC | PRN

## 2015-07-31 MED ORDER — BIVALIRUDIN 250 MG IV SOLR
INTRAVENOUS | Status: AC
  Filled 2015-07-31: qty 250

## 2015-07-31 MED ORDER — SODIUM CHLORIDE 0.9 % IV SOLN
INTRAVENOUS | Status: AC
  Administered 2015-07-31: 14:00:00 via INTRAVENOUS

## 2015-07-31 MED ORDER — BIVALIRUDIN BOLUS VIA INFUSION - CUPID
INTRAVENOUS | Status: DC | PRN
  Administered 2015-07-31: 65.325 mg via INTRAVENOUS

## 2015-07-31 MED ORDER — ATORVASTATIN CALCIUM 20 MG PO TABS
80.0000 mg | ORAL_TABLET | Freq: Every day | ORAL | Status: DC
  Administered 2015-08-01: 80 mg via ORAL
  Filled 2015-07-31: qty 4

## 2015-07-31 MED ORDER — ADENOSINE 6 MG/2ML IV SOLN
INTRAVENOUS | Status: AC
  Filled 2015-07-31: qty 2

## 2015-07-31 MED ORDER — NITROGLYCERIN 5 MG/ML IV SOLN
INTRAVENOUS | Status: AC
  Filled 2015-07-31: qty 10

## 2015-07-31 MED ORDER — MIDAZOLAM HCL 2 MG/2ML IJ SOLN
INTRAMUSCULAR | Status: AC
  Filled 2015-07-31: qty 2

## 2015-07-31 MED ORDER — SODIUM CHLORIDE 0.9 % IJ SOLN: 3.0000 mL | INTRAMUSCULAR | Status: DC | PRN

## 2015-07-31 MED ORDER — SODIUM CHLORIDE 0.9 % WEIGHT BASED INFUSION: 3.0000 mL/kg/h | INTRAVENOUS | Status: DC

## 2015-07-31 MED ORDER — METOPROLOL TARTRATE 25 MG PO TABS
25.0000 mg | ORAL_TABLET | Freq: Two times a day (BID) | ORAL | Status: DC
  Filled 2015-07-31: qty 1

## 2015-07-31 MED ORDER — CLOPIDOGREL BISULFATE 75 MG PO TABS
75.0000 mg | ORAL_TABLET | Freq: Every day | ORAL | Status: DC
  Administered 2015-08-01: 75 mg via ORAL
  Filled 2015-07-31: qty 1

## 2015-07-31 MED ORDER — CLOPIDOGREL BISULFATE 75 MG PO TABS
ORAL_TABLET | ORAL | Status: DC | PRN
  Administered 2015-07-31: 300 mg via ORAL

## 2015-07-31 MED ORDER — SODIUM CHLORIDE 0.9 % IV SOLN: 250.0000 mL | INTRAVENOUS | Status: DC | PRN

## 2015-07-31 MED ORDER — SODIUM CHLORIDE 0.9 % IJ SOLN: 3.0000 mL | Freq: Two times a day (BID) | INTRAMUSCULAR | Status: DC

## 2015-07-31 MED ORDER — ASPIRIN 81 MG PO CHEW: 81.0000 mg | CHEWABLE_TABLET | ORAL | Status: DC

## 2015-07-31 MED ORDER — HEPARIN SODIUM (PORCINE) 1000 UNIT/ML IJ SOLN
INTRAMUSCULAR | Status: DC | PRN
  Administered 2015-07-31: 2000 [IU] via INTRAVENOUS

## 2015-07-31 MED ORDER — FENTANYL CITRATE (PF) 100 MCG/2ML IJ SOLN
INTRAMUSCULAR | Status: DC | PRN
  Administered 2015-07-31 (×2): 25 ug via INTRAVENOUS
  Administered 2015-07-31: 50 ug via INTRAVENOUS

## 2015-07-31 MED ORDER — NITROGLYCERIN 0.4 MG SL SUBL: 0.4000 mg | SUBLINGUAL_TABLET | SUBLINGUAL | Status: DC | PRN

## 2015-07-31 MED ORDER — ASPIRIN EC 81 MG PO TBEC
81.0000 mg | DELAYED_RELEASE_TABLET | Freq: Every day | ORAL | Status: DC
  Administered 2015-08-01: 81 mg via ORAL
  Filled 2015-07-31: qty 1

## 2015-07-31 MED ORDER — PANTOPRAZOLE SODIUM 40 MG PO TBEC
40.0000 mg | DELAYED_RELEASE_TABLET | Freq: Every day | ORAL | Status: DC
  Administered 2015-08-01: 40 mg via ORAL
  Filled 2015-07-31: qty 1

## 2015-07-31 MED ORDER — HYDROCHLOROTHIAZIDE 25 MG PO TABS
25.0000 mg | ORAL_TABLET | Freq: Every day | ORAL | Status: DC
  Administered 2015-08-01: 25 mg via ORAL
  Filled 2015-07-31: qty 1

## 2015-07-31 MED ORDER — SODIUM CHLORIDE 0.9 % IJ SOLN
3.0000 mL | Freq: Two times a day (BID) | INTRAMUSCULAR | Status: DC
  Administered 2015-07-31 – 2015-08-01 (×2): 3 mL via INTRAVENOUS

## 2015-07-31 MED ORDER — FENTANYL CITRATE (PF) 100 MCG/2ML IJ SOLN
INTRAMUSCULAR | Status: AC
  Filled 2015-07-31: qty 2

## 2015-07-31 MED ORDER — ACETAMINOPHEN 325 MG PO TABS
650.0000 mg | ORAL_TABLET | Freq: Four times a day (QID) | ORAL | Status: DC | PRN
  Administered 2015-07-31: 650 mg via ORAL
  Filled 2015-07-31: qty 2

## 2015-07-31 MED ORDER — TAMSULOSIN HCL 0.4 MG PO CAPS
0.4000 mg | ORAL_CAPSULE | Freq: Every day | ORAL | Status: DC
  Administered 2015-08-01: 0.4 mg via ORAL
  Filled 2015-07-31: qty 1

## 2015-07-31 MED ORDER — IOHEXOL 300 MG/ML  SOLN
INTRAMUSCULAR | Status: DC | PRN
  Administered 2015-07-31: 220 mL via INTRA_ARTERIAL

## 2015-07-31 MED ORDER — HEPARIN SODIUM (PORCINE) 1000 UNIT/ML IJ SOLN
INTRAMUSCULAR | Status: AC
  Filled 2015-07-31: qty 1

## 2015-07-31 SURGICAL SUPPLY — 24 items
CATH BALLN WEDGE 5F 110CM (CATHETERS) ×1 IMPLANT
CATH INFINITI 5 FR IM (CATHETERS) ×2 IMPLANT
CATH INFINITI 5FR ANG PIGTAIL (CATHETERS) ×2 IMPLANT
CATH INFINITI 5FR JL4 (CATHETERS) ×2 IMPLANT
CATH INFINITI JR4 5F (CATHETERS) ×2 IMPLANT
CATH SWANZ 7F THERMO (CATHETERS) ×2 IMPLANT
CATH VISTA GUIDE 6FR AL1 (CATHETERS) ×2 IMPLANT
CATH VISTA GUIDE 6FR JR4 (CATHETERS) ×2 IMPLANT
DEVICE CLOSURE MYNXGRIP 6/7F (Vascular Products) ×4 IMPLANT
DEVICE INFLAT 30 PLUS (MISCELLANEOUS) ×2 IMPLANT
GUIDEWIRE EMER 3M J .025X150CM (WIRE) ×2 IMPLANT
KIT MANI 3VAL PERCEP (MISCELLANEOUS) ×3 IMPLANT
KIT RIGHT HEART (MISCELLANEOUS) ×3 IMPLANT
NDL PERC 18GX7CM (NEEDLE) IMPLANT
NEEDLE PERC 18GX7CM (NEEDLE) ×3 IMPLANT
PACK CARDIAC CATH (CUSTOM PROCEDURE TRAY) ×3 IMPLANT
SHEATH AVANTI 5FR X 11CM (SHEATH) IMPLANT
SHEATH AVANTI 6FR X 11CM (SHEATH) ×2 IMPLANT
SHEATH PINNACLE 7F 10CM (SHEATH) ×2 IMPLANT
STENT XIENCE ALPINE RX 4.0X15 (Permanent Stent) ×2 IMPLANT
WIRE EMERALD 3MM-J .035X150CM (WIRE) ×2 IMPLANT
WIRE EMERALD 3MM-J .035X260CM (WIRE) ×2 IMPLANT
WIRE HITORQ VERSACORE ST 145CM (WIRE) ×2 IMPLANT
WIRE INTUITION PROPEL ST 180CM (WIRE) ×2 IMPLANT

## 2015-07-31 NOTE — Interval H&P Note (Signed)
History and Physical Interval Note:  07/31/2015 8:51 AM  Matthew [name]  has presented today for surgery, with the diagnosis of Possible PCI  The various methods of treatment have been discussed with the patient and family. After consideration of risks, benefits and other options for treatment, the patient has consented to  Procedure(s): Right/Left Heart Cath and Coronary/Graft Angiography (N/A) as a surgical intervention .  The patient's history has been reviewed, patient examined, no change in status, stable for surgery.  I have reviewed the patient's chart and labs.  Questions were answered to the patient's satisfaction.     [name]

## 2015-07-31 NOTE — Discharge Summary (Signed)
Discharge Summary   Patient ID: ZEBBIE ACE MRN: [id number], DOB/AGE: [birth year] 79 y.o. Admit date: 07/31/2015 D/C date:     08/01/2015  Primary Care Provider: Vesta Mixer Primary Cardiologist: Dr. Fletcher Anon, MD   Primary Discharge Diagnoses:  CAD with history of MI s/p CABG 1996 s/p redo 2004 s/p PCI 07/31/2015  Secondary Discharge Diagnoses:  1) Moderate aortic stenosis  2) PAD s/p bilateral iliac kissing stent placement  3) HTN 4) HLD 5) Thrombocytopenia   Hospital Course:  79 year old with h/o CAD, MI in 1996,s/p CABG 1996 with redo 2004, moderate aortic stenosis, PAD status post bilateral iliac kissing stent placement, HTN and HLD who was recently seen in clinic on 7/19 for exertional chest pain by Dr. Fletcher Anon, MD and underwent outpatient diagnostic cardaic cath s/p PCI of SVG to OM2.  He was seen in March of 2015 by Dr. Rockey Situ for chest pain worrisome for angina. Cardiac catheterization was attempted via the right femoral artery. However, he was found to have subtotally occluded common iliac artery with inability to advance the catheters. Femoral pulse was also weak on the left side. Thus, cardiac catheterization via the right radial artery was attempted by Dr. Fletcher Anon, but he was found to have significant tortuosity in the innominate artery with inability torque catheters. Left radial artery pulses were absent. He reported significant bilateral buttock and leg claudication.  He successfully underwent cardiac catheterization via the left femoral artery at [facility] on 03/06/2014 which showed patent grafts with normal ejection fraction. There was moderate aortic stenosis. He was found to have subtotally occluded right common iliac artery and 70% stenosis in the left common iliac artery. He underwent successful bilateral kissing stent placement without complications. Claudication resolved completely. Most recent duplex in May 2016 showed patent common iliac artery stents. There was  moderate stenosis affecting bilateral external iliac arteries.  He was admitted to Dr. Tyrell Antonio schedule on 07/01/2015 due to recent symptoms of substernal chest pain happening mainly when he overexerts himself. He had a prolonged episode after mawing the lawn the week prior to his appointment. He also had intermittent milder episodes with less intense activities. This has been associated with shortness of breath. He has been working in the hot weather lately. He underwent echo on 7/25 to evaluate his aortic stenosis and righ heart pressure that showed EF 55-60%, septal wall motion c/w post-operative state, GR1DD, moderate aortic stenosis with mean gradient 28 mm Hg, peak gradient 48 mmHg, and valve area 1.18 cm2. There was mild MR, RV systolci function was normal, right sided pressure was normal. Given his symptoms he underwent Lexiscan Myoview on 8/5 that showed no significant ischemia, old inferior MI from basal to mid region, inferior WMA from basal to mid inferior wall, EF 35%, baseline EKG with possible old inferior MI and rare PVC, Low risk scan. He continued to have chest pain and SOB, though he did not want to stop mowing the lawn. He was agreeable to move forward to cardiac cath. He was scheudled for outpatient diagnostic RHC/LHC on 8/18.   Cardiac cath showed prox LAD 100%, mid LCx 100%, OM2 - 2nd mrg lesion 80%, prox RCA 100%, graft anatomy - LIMA to LAD LIMA was visualized by non-selective angiography due to inability to cannulate and is normal in caliber, and is anatomically normal, SVG to RPDA normal in caliber with mild disease in the graft, SVG to OM3 normal in caliber with minimal luminal irregs, SVG to OM2 normal in caliber  with proximal 85% stenosis s/p DES. Pre-interventional distal flow was normal (TIMI 3). No pre-stent angioplasty was performed. Minimum lumen area: 4 mm. The strut is apposed. No post-stent angioplasty was performed. Maximum pressure: 10 atm. The post-interventional distal  flow is normal (TIMI 3). No complications occurred at this lesion. Tere was no residual stenosis post intervention. Right heart findings: Hemodynamic findings consistent with aortic stenosis. LV EDP is normal. Cardiac output was 5.27 with an index of 2.61. Aortic valve peak to peak gradient is 29 mmHg with a calculated valve area of 0.91.   It was recommended he continue dual antiplatelet therapy for at least one year (previously on aspirin 81 mg and Plavix 75 mg daily prior to cardiac cath). The patient will likely require intervention in his aortic valve in the near future most likely via TAVR. Please note that there was difficulty in advancing the catheters via the right common iliac artery stent due to severe post stenotic dilatation. In the future, should consider access via the left common femoral artery.  He was continued on Lipitor 80 mg daily, HCTZ 25 mg daily, Isordil 10 mg tid, lisinoril 20 mg daily, Lopressor 25 mg bid post cath.   Pre-cath CBC showed thrombocytopenia of 116, post cath PLT count was 103.   Post-PCI, he has been ambulating without recurrent symptoms or limitations.  He will be discharged home today in good condition.   Consults: None  Discharge Vitals: Blood pressure 119/53, pulse 58, temperature 98 F (36.7 C), temperature source Oral, resp. rate 18, height 5' 9"  (1.753 m), weight 198 lb 3.1 oz (89.9 kg), SpO2 95 %.  Labs: Lab Results  Component Value Date   WBC 6.1 08/01/2015   HGB 13.7 08/01/2015   HCT 40.1 08/01/2015   MCV 89.4 08/01/2015   PLT 103* 08/01/2015     Recent Labs Lab 08/01/15 0424  NA 136  K 3.6  CL 104  CO2 26  BUN 18  CREATININE 0.91  CALCIUM 8.8*  GLUCOSE 119*    Lab Results  Component Value Date   CHOL 133 05/19/2009   HDL 40 05/19/2009   LDLCALC 75 05/19/2009   TRIG 92 05/19/2009    Diagnostic Studies/Procedures   Cardiac catheterization 07/31/2015:  Coronary Findings    Dominance: Right   Left Anterior Descending     . Prox LAD lesion, 100% stenosed.     Left Circumflex   . Mid Cx lesion, 100% stenosed.   . Second Obtuse Marginal Branch   . 2nd Mrg lesion, 80% stenosed.     Right Coronary Artery   . Prox RCA lesion, 100% stenosed.     Graft Angiography    Free Graft to RPDA  SVG was injected is normal in caliber. There is mild disease in the graft.     Free Graft to 3rd Mrg  SVG was injected is normal in caliber. The graft exhibits minimal luminal irregularities.     Graft to 2nd Mrg  was injected is normal in caliber. There is severe focal disease in the graft.   . Prox Graft lesion, 85% stenosed. The lesion is type C thrombotic . The lesion was not previously treated.   Marland Kitchen PCI: The pre-interventional distal flow is normal (TIMI 3). No pre-stent angioplasty was performed. A drug-eluting stent was placed. Minimum lumen area: 4 mm. The strut is apposed. No post-stent angioplasty was performed. Maximum pressure: 10 atm. The post-interventional distal flow is normal (TIMI 3). No complications occurred at this lesion.  Marland Kitchen  There is no residual stenosis post intervention.       Free LIMA Graft to Mid LAD  LIMA was visualized by non-selective angiography due to inability to cannulate and is normal in caliber, and is anatomically normal.           Right Heart Pressures Hemodynamic findings consistent with aortic stenosis. LV EDP is normal. Cardiac output was 5.27 with an index of 2.61. Aortic valve peak to peak gradient is 29 mmHg with a calculated valve area of 0.91.    Dg Chest 2 View  07/28/2015   CLINICAL DATA:  Preop catheterization.  EXAM: CHEST  2 VIEW  COMPARISON:  06/04/2015  FINDINGS: There is mild right basilar scarring. There is no focal parenchymal opacity. There is no pleural effusion or pneumothorax. The heart and mediastinal contours are unremarkable. There is evidence of prior CABG.  The osseous structures are unremarkable.  IMPRESSION: No active cardiopulmonary disease.    Electronically Signed   By: Kathreen Devoid   On: 07/28/2015 10:07   Nm Myocar Multi W/spect W/wall Motion / Leory Plowman  07/18/2015   Pharmacological myocardial perfusion imaging study with no significant  ischemia.  There is old inferior MI from basal to mid region. Inferior wall motion abnormality from basal to mid inferior wall. EF estimated at 35% Baseline EKG with possible old inferior MI and rare PVCs. No EKG changes  concerning for ischemia with infusion. Low risk scan   Signed, Esmond Plants,  MD  07/18/2015   Pharmacological myocardial perfusion imaging study with no significant  ischemia.  There is old inferior MI from basal to mid region. Inferior wall motion abnormality from basal to mid inferior wall. EF estimated at 35% Baseline EKG with possible old inferior MI and rare PVCs. No EKG changes  concerning for ischemia with infusion. Low risk scan    Discharge Medications   Current Discharge Medication List    START taking these medications   Details  pantoprazole (PROTONIX) 40 MG tablet Take 1 tablet (40 mg total) by mouth daily. Qty: 90 tablet, Refills: 3      CONTINUE these medications which have NOT CHANGED   Details  acetaminophen (TYLENOL) 325 MG tablet Take 650 mg by mouth daily as needed.    aspirin EC 81 MG tablet Take 1 tablet (81 mg total) by mouth daily. Qty: 90 tablet, Refills: 3    atorvastatin (LIPITOR) 80 MG tablet Take 80 mg by mouth daily.    clopidogrel (PLAVIX) 75 MG tablet Take 1 tablet (75 mg total) by mouth daily. Qty: 30 tablet, Refills: 6    hydrochlorothiazide (HYDRODIURIL) 25 MG tablet Take 25 mg by mouth daily. TAKE ONE TABLET BY MOUTH EVERY DAY    isosorbide dinitrate (ISORDIL) 10 MG tablet TAKE ONE TABLET BY MOUTH THREE TIMES DAILY Qty: 270 tablet, Refills: 3    lisinopril (PRINIVIL,ZESTRIL) 20 MG tablet Take 20 mg by mouth daily.    metoprolol tartrate (LOPRESSOR) 25 MG tablet TAKE ONE-HALF TABLET BY MOUTH TWICE DAILY Qty: 90 tablet, Refills: 3      nitroGLYCERIN (NITROSTAT) 0.4 MG SL tablet Place 0.4 mg under the tongue every 5 (five) minutes as needed.    tamsulosin (FLOMAX) 0.4 MG CAPS capsule Take 1 capsule (0.4 mg total) by mouth daily. Qty: 30 capsule, Refills: 3      STOP taking these medications     omeprazole (PRILOSEC) 40 MG capsule         Disposition   The patient will be  discharged in stable condition to home. Discharge Instructions    Diet - low sodium heart healthy    Complete by:  As directed      Increase activity slowly    Complete by:  As directed           Follow-up Information    Follow up with DUNN,RYAN, PA-C On 08/11/2015.   Specialties:  Physician Assistant, Interventional Cardiology, Radiology   Why:  Appointment: 1:30 PM    Contact information:   [address] [place] [address] [phone]         Duration of Discharge Encounter: Greater than 30 minutes including physician and PA time.  Signed, Murray Hodgkins, NP  08/01/2015, 9:39 AM

## 2015-07-31 NOTE — Plan of Care (Signed)
Problem: Phase I Progression Outcomes Goal: Other Phase I Outcomes/Goals Outcome: Completed/Met Date Met:  07/31/15 Pt is alert and oriented x 4, denies pain, admit from specials, cardiac cath with stent placement performed, vascular access through right femoral, closed with mynx dressing, dressing is dry and intact, good appetite, on room air, vital signs stable, wife at bedside, pt is likely to d/c on 8/19, brady on tele.

## 2015-07-31 NOTE — H&P (View-Only) (Signed)
Primary cardiologist: Dr. Rockey Situ  HPI  Matthew [name] is a 79 yo with h/o CAD, MI in 1996,  s/p CABG 1996 with redo 2004, moderate aortic stenosis, PAD status post bilateral iliac kissing stent placement, HTN and hyperlipidemia.  He was seen in March of 2015 by Dr. Rockey Situ for chest pain worrisome for angina. Cardiac catheterization was attempted via the right femoral artery. However, he was found to have subtotally occluded common iliac artery with inability to advance the catheters. Femoral pulse was also weak on the left side. I attempted catheterization via the right radial artery but he was found to have significant tortuosity in the innominate artery with inability torque catheters. Left radial artery pulses absent. He reported significant bilateral buttock and leg claudication. I proceeded with cardiac catheterization via the left femoral artery at [facility] which showed patent grafts with normal ejection fraction. There was moderate aortic stenosis. He was found to have subtotally occluded right common iliac artery and 70% stenosis in the left common iliac artery. I performed successful bilateral kissing stent placement without complications. Claudication resolved completely.  Most recent duplex in May showed patent common iliac artery stents. There was moderate stenosis affecting bilateral external iliac arteries. He was admitted to my schedule today due to recent symptoms of substernal chest pain happening mainly when he overexerts himself. He had a prolonged episode after mawing the lawn last week. He also had intermittent milder episodes with less intense activities. This has been associated with shortness of breath. He has been working in the hot weather lately.  No Known Allergies   Current Outpatient Prescriptions on File Prior to Visit  Medication Sig Dispense Refill  . acetaminophen (TYLENOL) 325 MG tablet Take 650 mg by mouth daily as needed.    Marland Kitchen aspirin EC 81 MG tablet Take 1 tablet (81  mg total) by mouth daily. 90 tablet 3  . atorvastatin (LIPITOR) 80 MG tablet Take 80 mg by mouth daily.    . clopidogrel (PLAVIX) 75 MG tablet Take 1 tablet (75 mg total) by mouth daily. 30 tablet 6  . hydrochlorothiazide (HYDRODIURIL) 25 MG tablet Take 25 mg by mouth daily. TAKE ONE TABLET BY MOUTH EVERY DAY    . isosorbide dinitrate (ISORDIL) 10 MG tablet TAKE ONE TABLET BY MOUTH THREE TIMES DAILY 270 tablet 3  . lisinopril (PRINIVIL,ZESTRIL) 20 MG tablet Take 20 mg by mouth daily.    . nitroGLYCERIN (NITROSTAT) 0.4 MG SL tablet Place 0.4 mg under the tongue every 5 (five) minutes as needed.    Marland Kitchen omeprazole (PRILOSEC) 40 MG capsule TAKE ONE CAPSULE BY MOUTH ONCE DAILY 90 capsule 3  . tamsulosin (FLOMAX) 0.4 MG CAPS capsule Take 1 capsule (0.4 mg total) by mouth daily. 30 capsule 3   No current facility-administered medications on file prior to visit.     Past Medical History  Diagnosis Date  . Hyperlipidemia   . HTN (hypertension)   . Nephrolithiasis   . AS (aortic stenosis)     mild  . Carotid bruit     2009 0-39% on dopplers bilatrally  . OSA (obstructive sleep apnea)   . Myocardial infarction 1996  . Shortness of breath   . GERD (gastroesophageal reflux disease)   . HOH (hard of hearing)   . History of kidney stones   . CAD (coronary artery disease)     MI 66 with CABG 1996. Redo CABG 06/2003.Marland Kitchen Myoview 7/09 EF 54% inferobasal infarct. no ischemia. Myoview 6/10 EF 43% inferior wall infarct.  no ischemai. Echo 6/10: EF 55% mild AS mean 7.  . PAD (peripheral artery disease) 02/2014    Subtotal occlusion of right common iliac artery and 70% stenosis in the left common iliac artery. Status post bilateral kissing stent placement. Significant post stenosis aneurysmal dilatation on the right side (any future catheterization through the right femoral artery should be done cautiously to avoid advancing the wire behind the stent struts)     Past Surgical History  Procedure Laterality  Date  . Stomach surgery      removal of gastric ulcers  . Coronary artery bypass graft  1991    at [facility]. Redone 2004-3 vessels 1st time and 4 second time  . Inguinal hernia repair  11/10/2011    Procedure: HERNIA REPAIR INGUINAL ADULT;  Surgeon: Donato Heinz;  Location: [facility] ORS;  Service: General;  Laterality: Right;  . Cardiac catheterization  02/2014    Severe three-vessel coronary artery disease with patent grafts.   . Left heart catheterization with coronary/graft angiogram N/A 03/06/2014    Procedure: LEFT HEART CATHETERIZATION WITH Beatrix Fetters;  Surgeon: Wellington Hampshire, MD;  Location: [facility] CATH LAB;  Service: Cardiovascular;  Laterality: N/A;  . Abdominal aortagram N/A 03/06/2014    Procedure: ABDOMINAL Maxcine Ham;  Surgeon: Wellington Hampshire, MD;  Location: [place] CATH LAB;  Service: Cardiovascular;  Laterality: N/A;     Family History  Problem Relation Age of Onset  . Heart disease Father   . Heart disease Mother      History   Social History  . Marital Status: Married    Spouse Name: N/A  . Number of Children: N/A  . Years of Education: N/A   Occupational History  . Not on file.   Social History Main Topics  . Smoking status: Former Smoker -- 1.00 packs/day for 50 years    Types: Cigarettes    Quit date: 01/24/1990  . Smokeless tobacco: Not on file     Comment: quit in early 90's  . Alcohol Use: No  . Drug Use: No  . Sexual Activity: Not on file   Other Topics Concern  . Not on file   Social History Narrative   Married an lives with wife in [place]. Retired form [employer].       PHYSICAL EXAM   BP 129/75 mmHg  Pulse 78  Ht 5\' 9"  (1.753 m)  Wt 191 lb (86.637 kg)  BMI 28.19 kg/m2 Constitutional: He is oriented to person, place, and time. He appears well-developed and well-nourished. No distress.  HENT: No nasal discharge.  Head: Normocephalic and atraumatic.  Eyes: Pupils are equal and round.  No discharge. Neck: Normal range  of motion. Neck supple. No JVD present. No thyromegaly present.  Cardiovascular: Normal rate, regular rhythm, normal heart sounds. Exam reveals no gallop and no friction rub. There is a 3/6 systolic ejection murmur at the aortic area which is late peaking. S2 is mildly diminished.  Pulmonary/Chest: Effort normal and breath sounds normal. No stridor. No respiratory distress. He has no wheezes. He has no rales. He exhibits no tenderness.  Abdominal: Soft. Bowel sounds are normal. He exhibits no distension. There is no tenderness. There is no rebound and no guarding.  Musculoskeletal: Normal range of motion. He exhibits no edema and no tenderness.  Neurological: He is alert and oriented to person, place, and time. Coordination normal.  Skin: Skin is warm and dry. No rash noted. He is not diaphoretic. No erythema. No pallor.  Psychiatric:  He has a normal mood and affect. His behavior is normal. Judgment and thought content normal.  Vascular: Femoral pulses normal bilaterally. Distal pulses are palpable.     EKG: Sinus rhythm with PACs and PVCs, left ventricular hypertrophy with poor R-wave progression. Old inferior infarct. No significant change from most recent EKG    ASSESSMENT AND PLAN

## 2015-07-31 NOTE — OR Nursing (Signed)
Sat up without change in groin, eating lunch , wife at bedside

## 2015-07-31 NOTE — Progress Notes (Signed)
Patient's HR has been in the 50s throughout the day and has Metoprolol 25 mg to be administered at this time. Dr. Jannifer Franklin notified with new order to hold the Metoprolol. Will continue to monitor.

## 2015-07-31 NOTE — Care Management (Signed)
Admitted as outpatient after elective cardiac cath with PCI.  Patient at present is taking Plavix which would not be cost prohibitive

## 2015-08-01 ENCOUNTER — Encounter: Payer: Self-pay | Admitting: Nurse Practitioner

## 2015-08-01 ENCOUNTER — Telehealth: Payer: Self-pay

## 2015-08-01 DIAGNOSIS — I2510 Atherosclerotic heart disease of native coronary artery without angina pectoris: Secondary | ICD-10-CM | POA: Diagnosis present

## 2015-08-01 DIAGNOSIS — I208 Other forms of angina pectoris: Secondary | ICD-10-CM | POA: Diagnosis not present

## 2015-08-01 DIAGNOSIS — I25119 Atherosclerotic heart disease of native coronary artery with unspecified angina pectoris: Secondary | ICD-10-CM | POA: Diagnosis not present

## 2015-08-01 LAB — BASIC METABOLIC PANEL
ANION GAP: 6 (ref 5–15)
BUN: 18 mg/dL (ref 6–20)
CO2: 26 mmol/L (ref 22–32)
Calcium: 8.8 mg/dL — ABNORMAL LOW (ref 8.9–10.3)
Chloride: 104 mmol/L (ref 101–111)
Creatinine, Ser: 0.91 mg/dL (ref 0.61–1.24)
GFR calc Af Amer: >60 mL/min (ref >60)
GLUCOSE: 119 mg/dL — AB (ref 65–99)
POTASSIUM: 3.6 mmol/L (ref 3.5–5.1)
Sodium: 136 mmol/L (ref 135–145)

## 2015-08-01 LAB — CBC
HEMATOCRIT: 40.1 % (ref 40.0–52.0)
HEMOGLOBIN: 13.7 g/dL (ref 13.0–18.0)
MCH: 30.6 pg (ref 26.0–34.0)
MCHC: 34.2 g/dL (ref 32.0–36.0)
MCV: 89.4 fL (ref 80.0–100.0)
Platelets: 103 10*3/uL — ABNORMAL LOW (ref 150–440)
RBC: 4.48 MIL/uL (ref 4.40–5.90)
RDW: 14.3 % (ref 11.5–14.5)
WBC: 6.1 10*3/uL (ref 3.8–10.6)

## 2015-08-01 MED ORDER — PANTOPRAZOLE SODIUM 40 MG PO TBEC: 40.0000 mg | DELAYED_RELEASE_TABLET | Freq: Every day | ORAL | Status: DC

## 2015-08-01 NOTE — Discharge Instructions (Signed)
**  PLEASE REMEMBER TO BRING ALL OF YOUR MEDICATIONS TO EACH OF YOUR FOLLOW-UP OFFICE VISITS. ° °NO HEAVY LIFTING OR SEXUAL ACTIVITY X 7 DAYS. °NO DRIVING X 3-5 DAYS. °NO SOAKING BATHS, HOT TUBS, POOLS, ETC., X 7 DAYS. ° °Groin Site Care °Refer to this sheet in the next few weeks. These instructions provide you with information on caring for yourself after your procedure. Your caregiver may also give you more specific instructions. Your treatment has been planned according to current medical practices, but problems sometimes occur. Call your caregiver if you have any problems or questions after your procedure. °HOME CARE INSTRUCTIONS °· You may shower 24 hours after the procedure. Remove the bandage (dressing) and gently wash the site with plain soap and water. Gently pat the site dry.  °· Do not apply powder or lotion to the site.  °· Do not sit in a bathtub, swimming pool, or whirlpool for 5 to 7 days.  °· No bending, squatting, or lifting anything over 10 pounds (4.5 kg) as directed by your caregiver.  °· Inspect the site at least twice daily.  °· Do not drive home if you are discharged the same day of the procedure. Have someone else drive you.  ° °What to expect: °· Any bruising will usually fade within 1 to 2 weeks.  °· Blood that collects in the tissue (hematoma) may be painful to the touch. It should usually decrease in size and tenderness within 1 to 2 weeks.  °SEEK IMMEDIATE MEDICAL CARE IF: °· You have unusual pain at the groin site or down the affected leg.  °· You have redness, warmth, swelling, or pain at the groin site.  °· You have drainage (other than a small amount of blood on the dressing).  °· You have chills.  °· You have a fever or persistent symptoms for more than 72 hours.  °· You have a fever and your symptoms suddenly get worse.  °· Your leg becomes pale, cool, tingly, or numb.  °You have heavy bleeding from the site. Hold pressure on the site. . ° °

## 2015-08-01 NOTE — Plan of Care (Signed)
Problem: Phase II Progression Outcomes Goal: Vascular site scale level 0 - I Vascular Site Scale Level 0: No bruising/bleeding/hematoma Level I (Mild): Bruising/Ecchymosis, minimal bleeding/ooozing, palpable hematoma < 3 cm Level II (Moderate): Bleeding not affecting hemodynamic parameters, pseudoaneurysm, palpable hematoma > 3 cm Level III (Severe) Bleeding which affects hemodynamic parameters or retroperitoneal hemorrhage  Outcome: Progressing Patient is alert and oriented this shift. No acute distress noted. Patient requested for Tylenol 650 mg PRN for back pain with relief. No bruising/bleeding/ hematoma noted on the right femoral access. Resting quietly at this time with his eyes closed, respirations even and unlabored.

## 2015-08-01 NOTE — Telephone Encounter (Signed)
Called pt regarding upcoming appt.

## 2015-08-01 NOTE — Progress Notes (Signed)
IV and tele removed. Discharge instructions reviewed with pt. Pt reports no pain. A & O. Up in room and tolerated it well. Pt has no further concerns at this time.

## 2015-08-04 ENCOUNTER — Telehealth: Payer: Self-pay

## 2015-08-04 NOTE — Telephone Encounter (Signed)
Patient contacted regarding discharge from [facility]  on 8/19.  Patient understands to follow up with provider Christell Faith on 8/29 at 1:30pm at [facility]. Patient understands discharge instructions? yes Patient understands medications and regiment? yes Patient understands to bring all medications to this visit? yes  Reviewed with daughter Marta Lamas who verbalized understanding.

## 2015-08-07 ENCOUNTER — Other Ambulatory Visit: Payer: Self-pay | Admitting: Cardiovascular Disease

## 2015-08-11 ENCOUNTER — Ambulatory Visit (INDEPENDENT_AMBULATORY_CARE_PROVIDER_SITE_OTHER): Payer: Medicare Other | Admitting: Physician Assistant

## 2015-08-11 ENCOUNTER — Encounter: Payer: Self-pay | Admitting: Physician Assistant

## 2015-08-11 VITALS — BP 128/60 | HR 59 | Ht 69.0 in | Wt 191.5 lb

## 2015-08-11 DIAGNOSIS — D696 Thrombocytopenia, unspecified: Secondary | ICD-10-CM

## 2015-08-11 DIAGNOSIS — I10 Essential (primary) hypertension: Secondary | ICD-10-CM

## 2015-08-11 DIAGNOSIS — I25118 Atherosclerotic heart disease of native coronary artery with other forms of angina pectoris: Secondary | ICD-10-CM | POA: Diagnosis not present

## 2015-08-11 DIAGNOSIS — I739 Peripheral vascular disease, unspecified: Secondary | ICD-10-CM | POA: Diagnosis not present

## 2015-08-11 DIAGNOSIS — I2510 Atherosclerotic heart disease of native coronary artery without angina pectoris: Secondary | ICD-10-CM | POA: Diagnosis not present

## 2015-08-11 DIAGNOSIS — I359 Nonrheumatic aortic valve disorder, unspecified: Secondary | ICD-10-CM

## 2015-08-11 DIAGNOSIS — E785 Hyperlipidemia, unspecified: Secondary | ICD-10-CM

## 2015-08-11 MED ORDER — TAMSULOSIN HCL 0.4 MG PO CAPS: 0.4000 mg | ORAL_CAPSULE | Freq: Every day | ORAL | Status: DC

## 2015-08-11 NOTE — Progress Notes (Signed)
Cardiology Hospital Follow Up Note:   Date of Encounter: 08/11/2015  ID: Matthew [name], DOB [birth year], MRN [id number]  PCP: Antionette Fairy, PA-C Primary Cardiologist: Dr. Fletcher Anon, MD  Chief Complaint  Patient presents with  . other    Rollow up from [facility] s/p cardiac cath. Meds reviewed by the patient verbally.      HPI:  79 year old male with history of CAD, MI in 1996,s/p CABG 1996 with redo 2004, moderate aortic stenosis, PAD status post bilateral iliac kissing stent placement, HTN and HLD who was recently seen in clinic on 7/19 for exertional chest pain by Dr. Fletcher Anon, MD and underwent outpatient diagnostic cardaic cath s/p PCI of SVG to OM2 presents for post hospital follow up from the above.   He was seen in March of 2015 by Dr. Rockey Situ for chest pain worrisome for angina. Cardiac catheterization was attempted via the right femoral artery. However, he was found to have subtotally occluded common iliac artery with inability to advance the catheters. Femoral pulse was also weak on the left side. Thus, cardiac catheterization via the right radial artery was attempted by Dr. Fletcher Anon, but he was found to have significant tortuosity in the innominate artery with inability torque catheters. Left radial artery pulses were absent. He reported significant bilateral buttock and leg claudication.   He successfully underwent cardiac catheterization via the left femoral artery at [facility] on 03/06/2014 which showed patent grafts with normal ejection fraction. There was moderate aortic stenosis. He was found to have subtotally occluded right common iliac artery and 70% stenosis in the left common iliac artery. He underwent successful bilateral kissing stent placement without complications. Claudication resolved completely. Most recent duplex in May 2016 showed patent common iliac artery stents. There was moderate stenosis affecting bilateral external iliac arteries.   He was added to Dr. Tyrell Antonio schedule on  07/01/2015 due to recent symptoms of substernal chest pain happening mainly when he overexerted himself. He had a prolonged episode after mowing the lawn the week prior to his appointment. He also had intermittent milder episodes with less intense activities. This had been associated with shortness of breath. He underwent echo on 7/25 to evaluate his aortic stenosis and right heart pressure that showed EF 55-60%, septal wall motion c/w post-operative state, GR1DD, moderate aortic stenosis with mean gradient 28 mm Hg, peak gradient 48 mm Hg, and valve area 1.18 cm2. There was mild MR, RV systolic function was normal, right sided pressure was normal. Given his symptoms he underwent Lexiscan Myoview on 8/5 that showed no significant ischemia, old inferior MI from basal to mid region, inferior WMA from basal to mid inferior wall, EF 35%, baseline EKG with possible old inferior MI and rare PVC, Low risk scan. He continued to have chest pain and SOB, though he did not want to stop mowing the lawn. He was agreeable to move forward to cardiac cath. He was scheudled for outpatient diagnostic RHC/LHC on 8/18.   Cardiac cath showed prox LAD 100%, mid LCx 100%, OM2 - 2nd mrg lesion 80%, prox RCA 100%, graft anatomy - LIMA to LAD, LIMA was visualized by non-selective angiography due to inability to cannulate and is normal in caliber, and is anatomically normal, SVG to RPDA normal in caliber with mild disease in the graft, SVG to OM3 normal in caliber with minimal luminal irregs, SVG to OM2 normal in caliber with proximal 85% stenosis s/p DES. Pre-interventional distal flow was normal (TIMI 3). No pre-stent angioplasty was performed.  Minimum lumen area: 4 mm. The strut is apposed. No post-stent angioplasty was performed. Maximum pressure: 10 atm. The post-interventional distal flow is normal (TIMI 3). No complications occurred at this lesion. There was no residual stenosis post intervention. Right heart findings: Hemodynamic  findings consistent with aortic stenosis. LV EDP is normal. Cardiac output was 5.27 with an index of 2.61. Aortic valve peak to peak gradient is 29 mmHg with a calculated valve area of 0.91.   It was recommended he continue dual antiplatelet therapy for at least one year (previously on aspirin 81 mg and Plavix 75 mg daily prior to cardiac cath). The patient will likely require intervention in his aortic valve in the near future most likely via TAVR. Please note that there was difficulty in advancing the catheters via the right common iliac artery stent due to severe post stenotic dilatation. In the future, should consider access via the left common femoral artery. He was continued on Lipitor 80 mg daily, HCTZ 25 mg daily, Isordil 10 mg tid, lisinoril 20 mg daily, Lopressor 12.5 mg bid post cath. Pre-cath CBC showed thrombocytopenia of 116, post cath PLT count was 103.  Since his discharge he has felt much better. He has mowed multiple lawns, with a Financial trader, and used a weed eater without issues. He does have multiple chickens about 1/4 of a mile down the yard that he typically rides a 4-wheeler down and back to feed and water. One week ago he decided to walk this distance. On the way back he did have a short episode of chest pain lasting 10-15 minutes that self resolved. None since. He reports he is much better than he was when he initially came in, and is pleased with his progress. He is taking his medications as directed and has not missed any doses. No claudication symptoms.        Past Medical History  Diagnosis Date  . Hyperlipidemia   . HTN (hypertension)   . Nephrolithiasis   . AS (aortic stenosis)     a. Echo 6/10: EF 55% mild AS; b. echo 06/2015; EF 55-60%, GR1DD, moderate AS, Peak velocity (S): 346 cm/s. Mean gradientS): 28 mm Hg. Peak gradient (S): 48 mm Hg. Valve area (VTI): 1.18 cm2  . Carotid bruit     2009 0-39% on dopplers bilatrally  . OSA (obstructive sleep apnea)   .  Myocardial infarction 1996  . Shortness of breath   . GERD (gastroesophageal reflux disease)   . HOH (hard of hearing)   . History of kidney stones   . CAD (coronary artery disease)     a. MI 1996 w/ CABG 1996; b.redo CABG 06/2003; c. Myoview 7/09: EF 54% inferobasal infarct, no ischemia. Myoview 6/10 EF 43% inf wall infarct. no ischemia; c. cath 8/16 s/p DES to VG-OM2. OTW 3VD w/ patent VG->RPDA, VG->OM3,  & LIMA->LAD.  EF 55-65%.  Marland Kitchen PAD (peripheral artery disease) 02/2014    Subtotal occlusion of right common iliac artery and 70% stenosis in the left common iliac artery. Status post bilateral kissing stent placement. 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 Stomach surgery      removal of gastric ulcers  . Coronary artery bypass graft  1991    at [facility]. Redone 2004-3 vessels 1st time and 4  second time  . Inguinal hernia repair  11/10/2011    Procedure: HERNIA REPAIR INGUINAL ADULT;  Surgeon: Donato Heinz;  Location: [facility];  Service: General;  Laterality: Right;  . Cardiac catheterization  02/2014    Severe three-vessel coronary artery disease with patent grafts.   . Left heart catheterization with coronary/graft angiogram N/A 03/06/2014    Procedure: LEFT HEART CATHETERIZATION WITH Beatrix Fetters;  Surgeon: Wellington Hampshire, MD;  Location: [facility] CATH LAB;  Service: Cardiovascular;  Laterality: N/A;  . Abdominal aortagram N/A 03/06/2014    Procedure: ABDOMINAL Maxcine Ham;  Surgeon: Wellington Hampshire, MD;  Location: [facility] CATH LAB;  Service: Cardiovascular;  Laterality: N/A;  . Cardiac catheterization N/A 07/31/2015    Procedure: Right/Left Heart Cath and Coronary/Graft Angiography;  Surgeon: Wellington Hampshire, MD;  Location: [facility] CV LAB;  Service: Cardiovascular;   Laterality: N/A;  . Cardiac catheterization N/A 07/31/2015    Procedure: Coronary Stent Intervention;  Surgeon: Wellington Hampshire, MD;  Location: [facility] CV LAB;  Service: Cardiovascular;  Laterality: N/A;  : Family History  Problem Relation Age of Onset  . Heart disease Father   . Heart disease Mother   :  reports that he quit smoking about 25 years ago. His smoking use included Cigarettes. He has a 50 pack-year smoking history. He does not have any smokeless tobacco history on file. He reports that he does not drink alcohol or use illicit drugs.:   Allergies:  No Known Allergies   Home Medications:  Current Outpatient Prescriptions  Medication Sig Dispense Refill  . acetaminophen (TYLENOL) 325 MG tablet Take 650 mg by mouth daily as needed.    Marland Kitchen aspirin EC 81 MG tablet Take 1 tablet (81 mg total) by mouth daily. 90 tablet 3  . atorvastatin (LIPITOR) 80 MG tablet Take 80 mg by mouth daily.    . clopidogrel (PLAVIX) 75 MG tablet Take 1 tablet (75 mg total) by mouth daily. 30 tablet 6  . hydrochlorothiazide (HYDRODIURIL) 25 MG tablet Take 25 mg by mouth daily. TAKE ONE TABLET BY MOUTH EVERY DAY    . isosorbide dinitrate (ISORDIL) 10 MG tablet TAKE ONE TABLET BY MOUTH THREE TIMES DAILY 270 tablet 3  . lisinopril (PRINIVIL,ZESTRIL) 20 MG tablet Take 20 mg by mouth daily.    . metoprolol tartrate (LOPRESSOR) 25 MG tablet TAKE ONE-HALF TABLET BY MOUTH TWICE DAILY 90 tablet 3  . nitroGLYCERIN (NITROSTAT) 0.4 MG SL tablet Place 0.4 mg under the tongue every 5 (five) minutes as needed.    . pantoprazole (PROTONIX) 40 MG tablet Take 1 tablet (40 mg total) by mouth daily. 90 tablet 3  . tamsulosin (FLOMAX) 0.4 MG CAPS capsule Take 1 capsule (0.4 mg total) by mouth daily. 30 capsule 3   No current facility-administered medications for this visit.     Review of Systems:  Review of Systems  Constitutional: Negative for fever, chills, weight loss, malaise/fatigue and diaphoresis.    HENT: Negative for congestion.   Eyes: Negative for discharge and redness.  Respiratory: Negative for cough, hemoptysis, sputum production, shortness of breath and wheezing.   Cardiovascular: Negative for chest pain, palpitations, orthopnea, claudication, leg swelling and PND.  Gastrointestinal: Negative for nausea, vomiting, blood in stool and melena.  Genitourinary: Negative for hematuria.  Musculoskeletal: Negative for falls.  Skin: Negative for rash.  Neurological: Negative for dizziness, sensory change, speech change, focal weakness and weakness.  Endo/Heme/Allergies: Does not bruise/bleed easily.  Psychiatric/Behavioral: The patient is not nervous/anxious.  All other systems reviewed and are negative.    Physical Exam:  Height 5' 9"  (1.753 m), weight 191 lb 8 oz (86.864 kg). BMI: Body mass index is 28.27 kg/(m^2). General: Pleasant, NAD. Psych: Normal affect. Responds to questions with normal affect.  Neuro: Alert and oriented X 3. Moves all extremities spontaneously. HEENT: Normocephalic, atraumatic. EOM intact bilaterally. Sclera anicteric.  Neck: Trachea midline. Supple without bruits or JVD. Lungs:  Respirations regular and unlabored, CTA bilaterally without wheezing, crackles, or rhonchi.  Heart: RRR, normal s3, s4. II/VI systolic murmur RUSB. No rubs, or gallops.  Abdomen: Soft, non-tender, non-distended, BS + x 4.  Extremities: No clubbing, cyanosis or edema. DP/PT/Radials 2+ and equal bilaterally. Cath site without bruits, active bleeding, bruising, swelling, erythema, or TTP.    Accessory Clinical Findings:  EKG: sinus bradycardia, 59 bpm, inferior Q waves, no significant st/t changes   Recent Labs: 08/01/2015: BUN 18; Creatinine, Ser 0.91; Hemoglobin 13.7; Platelets 103*; Potassium 3.6; Sodium 136     Component Value Date/Time   CHOL 133 05/19/2009 2134   TRIG 92 05/19/2009 2134   HDL 40 05/19/2009 2134   CHOLHDL 3.3 Ratio 05/19/2009 2134   VLDL 18  05/19/2009 2134   LDLCALC 75 05/19/2009 2134    Weights: Wt Readings from Last 3 Encounters:  08/11/15 191 lb 8 oz (86.864 kg)  07/31/15 198 lb 3.1 oz (89.9 kg)  07/01/15 191 lb (86.637 kg)    Estimated Creatinine Clearance: 75.4 mL/min (by C-G formula based on Cr of 0.91).   Other studies Reviewed: Additional studies/ records that were reviewed today include: office notes, [facility] notes.  Assessment & Plan:  1. CAD s/p CABG with redo as above: -No current angina  -Continue DAPT with aspirin 81 mg daily and Plavix daily for at least the next 12 months from the date of the cardiac cath -Continue Isordil 10 mg tid, Lopressor 12.5 mg bid, lisinopril 20 mg daily, Lipitor 80 mg   2. Aortic stenosis: -Recent echo on 07/07/2015 with moderate aortic stenosis -Continue with close monitoring via echo  3. HTN: -Controlled -Continue current medications as above, including HCTZ 25 mg daily   4. PAD: -No claudication symptoms s/p stenting   5. Thrombocytopenia: -Check CBC -PLT count at discharge of 103  6. HLD: -Continue Lipitor 80 mg -LDL of 81 in 2013 -Check fasting lipid panel and LFT   Dispo: -Follow up 6 months with Dr. Fletcher Anon, MD  Current medicines are reviewed at length with the patient today.  The patient did not have any concerns regarding medicines.   Christell Faith, PA-C [address] [place] [address] [place], [place] [address] [phone] [place] 08/11/2015, 1:13 PM

## 2015-08-11 NOTE — Patient Instructions (Signed)
Medication Instructions:  Your physician recommends that you continue on your current medications as directed. Please refer to the Current Medication list given to you today.   Labwork: Your physician recommends that you have labs today: CBC, liver, lipid profile    Testing/Procedures: none  Follow-Up: Your physician wants you to follow-up in: six months with Dr. Fletcher Anon.  You will receive a reminder letter in the mail two months in advance. If you don't receive a letter, please call our office to schedule the follow-up appointment.   Any Other Special Instructions Will Be Listed Below (If Applicable).

## 2015-08-12 LAB — HEPATIC FUNCTION PANEL
ALBUMIN: 4.5 g/dL (ref 3.5–4.8)
ALK PHOS: 77 IU/L (ref 39–117)
ALT: 17 IU/L (ref 0–44)
AST: 17 IU/L (ref 0–40)
Bilirubin Total: 1.4 mg/dL — ABNORMAL HIGH (ref 0.0–1.2)
Bilirubin, Direct: 0.31 mg/dL (ref 0.00–0.40)
TOTAL PROTEIN: 6.9 g/dL (ref 6.0–8.5)

## 2015-08-12 LAB — LIPID PANEL
CHOL/HDL RATIO: 3.6 ratio (ref 0.0–5.0)
Cholesterol, Total: 149 mg/dL (ref 100–199)
HDL: 41 mg/dL (ref >39)
LDL CALC: 63 mg/dL (ref 0–99)
Triglycerides: 224 mg/dL — ABNORMAL HIGH (ref 0–149)
VLDL CHOLESTEROL CAL: 45 mg/dL — AB (ref 5–40)

## 2015-08-13 ENCOUNTER — Other Ambulatory Visit: Payer: Self-pay

## 2015-08-14 ENCOUNTER — Other Ambulatory Visit (INDEPENDENT_AMBULATORY_CARE_PROVIDER_SITE_OTHER): Payer: Medicare Other

## 2015-08-14 DIAGNOSIS — Z01812 Encounter for preprocedural laboratory examination: Secondary | ICD-10-CM

## 2015-08-15 LAB — CBC WITH DIFFERENTIAL/PLATELET
BASOS ABS: 0.1 10*3/uL (ref 0.0–0.2)
BASOS: 1 %
EOS (ABSOLUTE): 0.1 10*3/uL (ref 0.0–0.4)
Eos: 2 %
Hematocrit: 44.2 % (ref 37.5–51.0)
Hemoglobin: 15.1 g/dL (ref 12.6–17.7)
IMMATURE GRANS (ABS): 0 10*3/uL (ref 0.0–0.1)
Immature Granulocytes: 0 %
LYMPHS ABS: 2.1 10*3/uL (ref 0.7–3.1)
Lymphs: 32 %
MCH: 30.8 pg (ref 26.6–33.0)
MCHC: 34.2 g/dL (ref 31.5–35.7)
MCV: 90 fL (ref 79–97)
Monocytes Absolute: 0.6 10*3/uL (ref 0.1–0.9)
Monocytes: 9 %
NEUTROS ABS: 3.6 10*3/uL (ref 1.4–7.0)
Neutrophils: 56 %
PLATELETS: 162 10*3/uL (ref 150–379)
RBC: 4.91 x10E6/uL (ref 4.14–5.80)
RDW: 13.8 % (ref 12.3–15.4)
WBC: 6.5 10*3/uL (ref 3.4–10.8)

## 2015-08-20 ENCOUNTER — Telehealth: Payer: Self-pay

## 2015-08-20 NOTE — Telephone Encounter (Signed)
Pt called back regarding lab results. States he did not want to leave a vm for the nurse, neither did he want to hold. States he has called 3 times for his lab results and stated to "just forget it" and hung up. Please call.

## 2015-08-20 NOTE — Telephone Encounter (Signed)
S/w pt regarding lab results. See result note.

## 2015-09-24 ENCOUNTER — Other Ambulatory Visit: Payer: Self-pay | Admitting: *Deleted

## 2015-09-24 ENCOUNTER — Telehealth: Payer: Self-pay | Admitting: *Deleted

## 2015-09-24 MED ORDER — NITROGLYCERIN 0.4 MG SL SUBL: 0.4000 mg | SUBLINGUAL_TABLET | SUBLINGUAL | Status: DC | PRN

## 2015-09-24 NOTE — Telephone Encounter (Signed)
°  1. Which medications need to be refilled? Nitro   2. Which pharmacy is medication to be sent to? [facility] in [place]  3. Do they need a 30 day or 90 day supply? [place]

## 2015-11-14 ENCOUNTER — Other Ambulatory Visit: Payer: Self-pay | Admitting: Cardiovascular Disease

## 2015-12-12 ENCOUNTER — Other Ambulatory Visit: Payer: Self-pay | Admitting: Physician Assistant

## 2015-12-17 ENCOUNTER — Other Ambulatory Visit: Payer: Self-pay | Admitting: Physician Assistant

## 2015-12-17 ENCOUNTER — Other Ambulatory Visit: Payer: Self-pay | Admitting: Cardiovascular Disease

## 2015-12-17 NOTE — Telephone Encounter (Signed)
Pt needs Flomax refilled. Please call

## 2015-12-22 ENCOUNTER — Other Ambulatory Visit: Payer: Self-pay | Admitting: Cardiovascular Disease

## 2016-01-16 ENCOUNTER — Other Ambulatory Visit: Payer: Self-pay | Admitting: Cardiovascular Disease

## 2016-01-16 DIAGNOSIS — I739 Peripheral vascular disease, unspecified: Secondary | ICD-10-CM

## 2016-01-22 ENCOUNTER — Other Ambulatory Visit: Payer: Medicare Other

## 2016-01-26 ENCOUNTER — Ambulatory Visit: Payer: Medicare Other

## 2016-01-26 DIAGNOSIS — I739 Peripheral vascular disease, unspecified: Secondary | ICD-10-CM | POA: Diagnosis not present

## 2016-03-22 ENCOUNTER — Telehealth: Payer: Self-pay | Admitting: Cardiovascular Disease

## 2016-03-22 NOTE — Telephone Encounter (Signed)
S/w daughter, [name], who reports 4-5 weeks ago, pt was cutting down a tree and complained of chest pain. Pain took 68m ASA and pain was relieved.  Daughter received a call from church yesterday. Pt was having chest pain and nauseated. Again, pain was relieved w/ aspirin. He then met his daughter for lunch and was asymptomatic. Pt did tell his granddaughter that when he walks to feed the chickens, he becomes SOB "about half way there". He normally has no dyspnea w/exertion. Pt refuses to take nitro because it make him "loopy in the head". Daughter is concerned and would like to know if pt can be seen in clinic. She is agreeable to PA if Dr. AFletcher Anonis not available.  Advised daughter to continue to monitor symptoms and if CP, nausea, vomiting, left arm pain, back pain, pt would need to be seen emergently. LWillette Clusteris agreeable w/plan. Forward to scheduling for appt.

## 2016-03-22 NOTE — Telephone Encounter (Signed)
Pt daughter calling stating pt is having increased CP/SOB She is worried, stated yesterday she got a call from his church. Stating he was having some CP  She states he said he Felt his stomach get upset then come to the CP , took an Asprin. That helped but would like to know what can be done.  Also having more SOB when just walking around Please advise.

## 2016-04-01 NOTE — Progress Notes (Signed)
Cardiology Office Note:    Date:  04/02/2016   ID:  Matthew [name], DOB 03-10-1936, MRN [id number]  PCP:  Matthew Fairy, PA-C  Cardiologist:  Dr. Kathlyn [name]   Electrophysiologist:  n/a  Referring MD: Matthew Fairy, PA-C   Chief Complaint  Patient presents with  . Chest Pain    History of Present Illness:     Matthew [name] is a 80 y.o. male with a hx of CAD status post CABG in 1996 in the setting of myocardial infarction, redo bypass in 2004, moderate aortic stenosis, PAD status post bilateral iliac stenting in the past, HTN, HL. LHC in 8/16 demonstrated patent LIMA-LAD, patent SVG-OM2, patent SVG-OM3, patent SVG-RPDA, patent RIMA-Dx. Patient did have a thrombotic lesion in the SVG-OM2 which was treated with a DES.  Echo in 7/16 with mean gradient 28 mmHg.  He returns today for the evaluation of chest discomfort. He tells me that he has had chest discomfort dating back to prior to his previous PCI in 8/16. His symptoms improved slightly after stenting. However, he continues to have exertional chest discomfort. He seems to describe CCS class II angina. He states that if he takes it easy and does not push himself he does not have symptoms. His chest discomfort is associated with dyspnea. He denies any radiating symptoms or nausea. Denies syncope. Denies orthopnea, PND or edema. He has taken nitroglycerin with relief but headache side effects were too great and he has decided not to use this further.   Past Medical History  Diagnosis Date  . Hyperlipidemia   . HTN (hypertension)   . Nephrolithiasis   . AS (aortic stenosis)     a. Echo 6/10: EF 55% mild AS; b. echo 06/2015; EF 55-60%, GR1DD, moderate AS, Peak velocity (S): 346 cm/s. Mean gradientS): 28 mm Hg. Peak gradient (S): 48 mm Hg. Valve area (VTI): 1.18 cm2  . Carotid bruit     2009 0-39% on dopplers bilatrally  . OSA (obstructive sleep apnea)   . Myocardial infarction ([place]) 1996  . Shortness of breath   . GERD  (gastroesophageal reflux disease)   . HOH (hard of hearing)   . History of kidney stones   . CAD (coronary artery disease)     a. MI 1996 w/ CABG 1996; b.redo CABG 06/2003; c. Myoview 7/09: EF 54% inferobasal infarct, no ischemia. Myoview 6/10 EF 43% inf wall infarct. no ischemia; c. cath 8/16 s/p DES to VG-OM2. OTW 3VD w/ patent VG->RPDA, VG->OM3,  & LIMA->LAD.  EF 55-65%.  Marland Kitchen PAD (peripheral artery disease) ([place]) 02/2014    Subtotal occlusion of right common iliac artery and 70% stenosis in the left common iliac artery. Status post bilateral kissing stent placement. 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 Thrombocytopenia [facility])     Past Surgical History  Procedure Laterality Date  . Stomach surgery      removal of gastric ulcers  . Coronary artery bypass graft  1991    at [facility]. Redone 2004-3 vessels 1st time and 4 second time  . Inguinal hernia repair  11/10/2011    Procedure: HERNIA REPAIR INGUINAL ADULT;  Surgeon: Donato Heinz;  Location: [facility];  Service: General;  Laterality: Right;  . Left heart catheterization with coronary/graft angiogram N/A 03/06/2014    Procedure: LEFT HEART CATHETERIZATION WITH CORONARY/GRAFT ANGIOGRAM;  Surgeon: Wellington Hampshire, MD;  Location: [facility] CATH LAB;  Service: Cardiovascular;  Laterality: N/A;  . Abdominal aortagram N/A 03/06/2014    Procedure: ABDOMINAL Maxcine Ham;  Surgeon: Wellington Hampshire, MD;  Location: [facility] CATH LAB;  Service: Cardiovascular;  Laterality: N/A;  . Cardiac catheterization  02/2014    Severe three-vessel coronary artery disease with patent grafts.   . Cardiac catheterization N/A 07/31/2015    Procedure: Right/Left Heart Cath and Coronary/Graft Angiography;  Surgeon: Wellington Hampshire, MD;  Location: [place] CV LAB;  Service: Cardiovascular;  Laterality: N/A;  . Cardiac  catheterization N/A 07/31/2015    Procedure: Coronary Stent Intervention;  Surgeon: Wellington Hampshire, MD;  Location: [place] CV LAB;  Service: Cardiovascular;  Laterality: N/A;    Current Medications: Outpatient Prescriptions Prior to Visit  Medication Sig Dispense Refill  . acetaminophen (TYLENOL) 325 MG tablet Take 650 mg by mouth daily as needed.    Marland Kitchen aspirin EC 81 MG tablet Take 1 tablet (81 mg total) by mouth daily. 90 tablet 3  . atorvastatin (LIPITOR) 80 MG tablet TAKE ONE TABLET BY MOUTH ONCE DAILY 90 tablet 3  . clopidogrel (PLAVIX) 75 MG tablet TAKE ONE TABLET BY MOUTH ONCE DAILY 30 tablet 6  . hydrochlorothiazide (HYDRODIURIL) 25 MG tablet TAKE ONE TABLET BY MOUTH ONCE DAILY 90 tablet 3  . lisinopril (PRINIVIL,ZESTRIL) 20 MG tablet TAKE ONE TABLET BY MOUTH ONCE DAILY 90 tablet 3  . nitroGLYCERIN (NITROSTAT) 0.4 MG SL tablet Place 1 tablet (0.4 mg total) under the tongue every 5 (five) minutes as needed. 25 tablet 3  . pantoprazole (PROTONIX) 40 MG tablet Take 1 tablet (40 mg total) by mouth daily. 90 tablet 3  . tamsulosin (FLOMAX) 0.4 MG CAPS capsule Take 1 capsule (0.4 mg total) by mouth daily. 30 capsule 3  . atorvastatin (LIPITOR) 80 MG tablet Take 80 mg by mouth daily.    . isosorbide dinitrate (ISORDIL) 10 MG tablet TAKE ONE TABLET BY MOUTH THREE TIMES DAILY 270 tablet 3  . metoprolol tartrate (LOPRESSOR) 25 MG tablet TAKE ONE-HALF TABLET BY MOUTH TWICE DAILY 90 tablet 3  . hydrochlorothiazide (HYDRODIURIL) 25 MG tablet Take 25 mg by mouth daily. TAKE ONE TABLET BY MOUTH EVERY DAY    . lisinopril (PRINIVIL,ZESTRIL) 20 MG tablet Take 20 mg by mouth daily.     No facility-administered medications prior to visit.     Allergies:   Review of patient's allergies indicates no known allergies.   Social History   Social History  . Marital Status: Married    Spouse Name: N/A  . Number of Children: N/A  . Years of Education: N/A   Social History Main Topics  . Smoking  status: Former Smoker -- 1.00 packs/day for 50 years    Types: Cigarettes    Quit date: 01/24/1990  . Smokeless tobacco: None     Comment: quit in early 90's  . Alcohol Use: No  . Drug Use: No  . Sexual Activity: Not Asked   Other Topics Concern  . None   Social History Narrative   Married an lives with wife in [place]. Retired form [employer].      Family History:  The patient's family history includes Heart disease in his father and mother.   ROS:   Please see the history of present illness.    Review of Systems  Cardiovascular: Positive for chest pain and dyspnea on exertion.  Respiratory: Positive for shortness of breath and snoring.  Neurological: Positive for dizziness.   All other systems reviewed and are negative.   Physical Exam:    VS:  BP 150/60 mmHg  Pulse 50  Ht 5\' 9"  (1.753 m)  Wt 186 lb 1.9 oz (84.423 kg)  BMI 27.47 kg/m2   GEN: Well nourished, well developed, in no acute distress HEENT: normal Neck: no JVD, no masses Cardiac: Normal S1, diminished/?absent S2, RRR; harsh crescendo decrescendo 2-3/6 murmur RUSB, no rubs, or gallops, no edema;     Respiratory:  clear to auscultation bilaterally; no wheezing, rhonchi or rales GI: soft, nontender, nondistended MS: no deformity or atrophy Skin: warm and dry Neuro: No focal deficits  Psych: Alert and oriented x 3, normal affect  Wt Readings from Last 3 Encounters:  04/02/16 186 lb 1.9 oz (84.423 kg)  08/11/15 191 lb 8 oz (86.864 kg)  07/31/15 198 lb 3.1 oz (89.9 kg)      Studies/Labs Reviewed:     EKG:  EKG is   ordered today.  The ekg ordered today demonstrates Sinus bradycardia, HR 51, inferior Q waves, normal axis, poor R-wave progression, QTc 429 ms, no change from prior tracing  Recent Labs: 08/01/2015: BUN 18; Creatinine, Ser 0.91; Hemoglobin 13.7; Potassium 3.6; Sodium 136 08/11/2015: ALT 17 08/14/2015: Platelets 162   Recent Lipid Panel    Component Value Date/Time   CHOL 149  08/11/2015 1402   CHOL 133 05/19/2009 2134   TRIG 224* 08/11/2015 1402   HDL 41 08/11/2015 1402   HDL 40 05/19/2009 2134   CHOLHDL 3.6 08/11/2015 1402   CHOLHDL 3.3 Ratio 05/19/2009 2134   VLDL 18 05/19/2009 2134   LDLCALC 63 08/11/2015 1402   LDLCALC 75 05/19/2009 2134    Additional studies/ records that were reviewed today include:   Aorta US 2/17 Normal caliber Abd Aorta; > 50% R CIA stenosis; patent CIA stents bilat; FU 1 year  LHC 8/16 pLAD 100 mLCx 100; OM2 80 pRCA 100 S-RPDA ok S-OM3 irregs pS-OM2 85% L-LAD ok EF 55-65% PCI: 4 x 15 mm Xience Alpine DES to S-OM2 1. Severe underlying three-vessel coronary artery disease with patent grafts including LIMA to LAD, SVG to OM2, SVG to OM 3, SVG to right PDA and RIMA to diagonal. There is significant thrombotic lesion and SVG to OM 2. The RIMA could not be injected due to severe tortuosity of the right innominate artery but there is known to be patent. 2. Normal filling pressures and cardiac output. Moderate to severe aortic stenosis. This has progressed since last study with a peak to peak gradient of 29 mmHg and valve area of 0.91. 3. Normal LV systolic function. 4. Successful drug-eluting stent placement to SVG to OM 2. Recommendations: Dual antiplatelets therapy for at least one year. The patient will likely require intervention in his aortic valve in the near future most likely via TAVR.  Please note that there was difficulty in advancing the catheters via the right common iliac artery stent due to severe post stenotic dilatation. In the future, should consider access via the left common femoral artery.  Myoview 8/16 No ischemia, old inferior MI, EF 35%  Echo 7/16 EF 0000000, grade 1 diastolic dysfunction, moderate aortic stenosis (mean 20 mmHg, peak 48 mmHg), mild MR   ASSESSMENT:     1. Exertional chest pain   2. Aortic stenosis   3. Coronary artery disease involving native coronary artery of native heart without  angina pectoris   4. Essential hypertension   5. Hyperlipidemia  6. PAD (peripheral artery disease) ([place])   7. Bradycardia     PLAN:     In order of problems listed above:  1. Chest pain - He presents today for further evaluation of exertional chest discomfort. He seems to describe exertional angina. He is somewhat of a poor historian. I believe that he is describing CCS class II symptoms. These symptoms have been fairly stable since his PCI in 8/16. He denies any significant worsening. His aortic stenosis has been graded as moderate in the past. I am having difficulty appreciating an S2 on exam. Question if his symptoms could be related to worsening aortic stenosis. It has not been quite a year since his last echocardiogram. He did have some stenosis in the OM2 beyond the graft insertion at his cardiac catheterization last year. It appears that this lesion would probably be treated medically. Question if this could be contributing to anginal symptoms.  -  Adjust antianginal therapy by increasing isosorbide to 20 mg in the morning and 10 mg in the evening  -  Obtain follow-up echocardiogram to reassess aortic stenosis  -  Obtain BMET, CBC  2. Aortic stenosis - Question if his aortic stenosis has worsened to contribute to his symptoms. As noted above, his symptoms of chest discomfort have been fairly stable since his PCI last year. He has not had any worsening symptoms. Of note, he only had slight improvement in his chest symptoms after his PCI. He denies syncope or heart failure symptoms. Obtain echocardiogram as noted.  3. CAD - As noted, he did have some distal vessel disease in his OM2 beyond the graft insertion. Question if this may be contributing to angina as his symptoms have been fairly stable over the past several months. Continue aspirin, statin, Plavix, beta blocker, nitrates. I have adjusted his nitrate dose to see if this helps his symptoms. I elected not to proceed with stress  testing at this time as he would need cardiac catheterization if his aortic valve gradients have worsened. If his aortic valve gradients are stable and his symptoms continue at follow-up, consider stress testing versus cardiac catheterization.  I will have him FU with Dr. Kathlyn [name] after his echo.    4. HTN - Borderline control. Adjust medical therapy as noted.  5. HL - LDL in 8/16 was 63. Continue current dose of statin.  6. PAD - He has had significant improvement in his claudication symptoms since his iliac stenting. Continue aspirin, Plavix, statin.  7. Bradycardia - He does describe occasional episodes of dizziness. As noted, he denies syncope. Question if bradycardia may be playing a role. Decrease metoprolol tartrate to 12.5 mg every afternoon.   Medication Adjustments/Labs and Tests Ordered: Current medicines are reviewed at length with the patient today.  Concerns regarding medicines are outlined above.  Medication changes, Labs and Tests ordered today are outlined in the Patient Instructions noted below. Patient Instructions  Medication Instructions:  1. DECREASE METOPROLOL TO 12.5 MG AT BEDTIME 2.CHANGE ISORDIL TWICE DAILY TO 20 MG IN THE MORNING 10 MG IN THE EVENING Labwork: TODAY BMET, CBC W/DIFF Testing/Procedures: Your physician has requested that you have an echocardiogram. TO BE DONE IN THE [place] OFFICE . Echocardiography is a painless test that uses sound waves to create images of your heart. It provides your doctor with information about the size and shape of your heart and how well your heart's chambers and valves are working. This procedure takes approximately one hour. There are no restrictions for  this procedure. Follow-Up: DR. Fletcher Anon IN 2 WEEKS IN [place] Any Other Special Instructions Will Be Listed Below (If Applicable). If you need a refill on your cardiac medications before your next appointment, please call your pharmacy.   Signed, Richardson Dopp,  PA-C  04/02/2016 12:30 PM    [facility] [address], [address] [place], [place]  [address] Phone: [phone]; Fax: [phone]

## 2016-04-02 ENCOUNTER — Encounter: Payer: Self-pay | Admitting: Physician Assistant

## 2016-04-02 ENCOUNTER — Ambulatory Visit (INDEPENDENT_AMBULATORY_CARE_PROVIDER_SITE_OTHER): Payer: Medicare Other | Admitting: Physician Assistant

## 2016-04-02 VITALS — BP 150/60 | HR 50 | Ht 69.0 in | Wt 186.1 lb

## 2016-04-02 DIAGNOSIS — I10 Essential (primary) hypertension: Secondary | ICD-10-CM | POA: Diagnosis not present

## 2016-04-02 DIAGNOSIS — R079 Chest pain, unspecified: Secondary | ICD-10-CM

## 2016-04-02 DIAGNOSIS — I2510 Atherosclerotic heart disease of native coronary artery without angina pectoris: Secondary | ICD-10-CM | POA: Diagnosis not present

## 2016-04-02 DIAGNOSIS — I350 Nonrheumatic aortic (valve) stenosis: Secondary | ICD-10-CM | POA: Diagnosis not present

## 2016-04-02 DIAGNOSIS — E785 Hyperlipidemia, unspecified: Secondary | ICD-10-CM

## 2016-04-02 DIAGNOSIS — I739 Peripheral vascular disease, unspecified: Secondary | ICD-10-CM

## 2016-04-02 DIAGNOSIS — R001 Bradycardia, unspecified: Secondary | ICD-10-CM

## 2016-04-02 LAB — CBC WITH DIFFERENTIAL/PLATELET
BASOS ABS: 71 {cells}/uL (ref 0–200)
Basophils Relative: 1 %
EOS ABS: 71 {cells}/uL (ref 15–500)
Eosinophils Relative: 1 %
HEMATOCRIT: 43.9 % (ref 38.5–50.0)
Hemoglobin: 15.2 g/dL (ref 13.2–17.1)
LYMPHS PCT: 32 %
Lymphs Abs: 2272 cells/uL (ref 850–3900)
MCH: 30.8 pg (ref 27.0–33.0)
MCHC: 34.6 g/dL (ref 32.0–36.0)
MCV: 89 fL (ref 80.0–100.0)
MONO ABS: 639 {cells}/uL (ref 200–950)
MPV: 10.5 fL (ref 7.5–12.5)
Monocytes Relative: 9 %
NEUTROS PCT: 57 %
Neutro Abs: 4047 cells/uL (ref 1500–7800)
Platelets: 154 10*3/uL (ref 140–400)
RBC: 4.93 MIL/uL (ref 4.20–5.80)
RDW: 13.4 % (ref 11.0–15.0)
WBC: 7.1 10*3/uL (ref 3.8–10.8)

## 2016-04-02 LAB — BASIC METABOLIC PANEL
BUN: 16 mg/dL (ref 7–25)
CHLORIDE: 102 mmol/L (ref 98–110)
CO2: 27 mmol/L (ref 20–31)
Calcium: 9.4 mg/dL (ref 8.6–10.3)
Creat: 0.99 mg/dL (ref 0.70–1.18)
GLUCOSE: 102 mg/dL — AB (ref 65–99)
POTASSIUM: 3.7 mmol/L (ref 3.5–5.3)
Sodium: 138 mmol/L (ref 135–146)

## 2016-04-02 MED ORDER — METOPROLOL TARTRATE 25 MG PO TABS: 12.5000 mg | ORAL_TABLET | Freq: Every day | ORAL | Status: DC

## 2016-04-02 MED ORDER — ISOSORBIDE DINITRATE 10 MG PO TABS: 20.0000 mg | ORAL_TABLET | ORAL | Status: DC

## 2016-04-02 NOTE — Patient Instructions (Addendum)
Medication Instructions:  1. DECREASE METOPROLOL TO 12.5 MG AT BEDTIME 2.CHANGE ISORDIL TWICE DAILY TO 20 MG IN THE MORNING 10 MG IN THE EVENING Labwork: TODAY BMET, CBC W/DIFF Testing/Procedures: Your physician has requested that you have an echocardiogram. TO BE DONE IN THE [place] OFFICE . Echocardiography is a painless test that uses sound waves to create images of your heart. It provides your doctor with information about the size and shape of your heart and how well your heart's chambers and valves are working. This procedure takes approximately one hour. There are no restrictions for this procedure. Follow-Up: DR. Fletcher Anon IN 2 WEEKS IN [place] Any Other Special Instructions Will Be Listed Below (If Applicable). If you need a refill on your cardiac medications before your next appointment, please call your pharmacy.

## 2016-04-05 ENCOUNTER — Telehealth: Payer: Self-pay | Admitting: *Deleted

## 2016-04-05 NOTE — Telephone Encounter (Signed)
Lmtcb x 2

## 2016-04-05 NOTE — Telephone Encounter (Signed)
Lmptcb to go over lab results 

## 2016-04-06 ENCOUNTER — Ambulatory Visit: Payer: Medicare Other | Admitting: Physician Assistant

## 2016-04-06 ENCOUNTER — Other Ambulatory Visit: Payer: Self-pay

## 2016-04-06 ENCOUNTER — Ambulatory Visit (INDEPENDENT_AMBULATORY_CARE_PROVIDER_SITE_OTHER): Payer: Medicare Other

## 2016-04-06 DIAGNOSIS — I350 Nonrheumatic aortic (valve) stenosis: Secondary | ICD-10-CM | POA: Diagnosis not present

## 2016-04-06 DIAGNOSIS — R079 Chest pain, unspecified: Secondary | ICD-10-CM | POA: Diagnosis not present

## 2016-04-07 ENCOUNTER — Telehealth: Payer: Self-pay | Admitting: *Deleted

## 2016-04-07 ENCOUNTER — Encounter: Payer: Self-pay | Admitting: Physician Assistant

## 2016-04-07 NOTE — Telephone Encounter (Signed)
Lmtcb to go over echo results.  

## 2016-04-12 ENCOUNTER — Ambulatory Visit (INDEPENDENT_AMBULATORY_CARE_PROVIDER_SITE_OTHER): Payer: Medicare Other | Admitting: Cardiovascular Disease

## 2016-04-12 ENCOUNTER — Encounter: Payer: Self-pay | Admitting: Cardiovascular Disease

## 2016-04-12 VITALS — BP 92/50 | HR 76 | Ht 69.0 in | Wt 190.0 lb

## 2016-04-12 DIAGNOSIS — I350 Nonrheumatic aortic (valve) stenosis: Secondary | ICD-10-CM | POA: Diagnosis not present

## 2016-04-12 DIAGNOSIS — I2510 Atherosclerotic heart disease of native coronary artery without angina pectoris: Secondary | ICD-10-CM | POA: Diagnosis not present

## 2016-04-12 NOTE — Patient Instructions (Signed)
Medication Instructions:  Your physician has recommended you make the following change in your medication:  STOP taking hydrochlorothiazide    Labwork: none  Testing/Procedures: none  Follow-Up: Your physician wants you to follow-up in: 6 months with Dr. Fletcher Anon.  You will receive a reminder letter in the mail two months in advance. If you don't receive a letter, please call our office to schedule the follow-up appointment.   Any Other Special Instructions Will Be Listed Below (If Applicable). Referral to Dr. Burt Knack at [facility] office in [place] for aortic stenosis.     If you need a refill on your cardiac medications before your next appointment, please call your pharmacy.

## 2016-04-12 NOTE — Progress Notes (Signed)
Cardiology Office Note   Date:  04/12/2016   ID:  KEAIR STEEVER, DOB 12-26-1935, MRN [id number]  PCP:  Antionette Fairy, PA-C  Cardiologist:   [name], MD   Chief Complaint  Patient presents with  . other    6 month f/u no complaints. Meds reviewed verbally .      History of Present Illness: Matthew [name] is a 80 y.o. male who presents for A follow-up visit regarding coronary artery disease , aortic stenosis and peripheral arterial disease. He is status post CABG twice in 1991 and 2004. He is status post bilateral common iliac artery kissing stent placement in 2015. He had symptoms of unstable angina in August 2016. I proceeded with cardiac catheterization which showed severe underlying three-vessel coronary artery disease with patent LIMA to LAD, SVG to OM 2, SVG to OM 3, SVG to right PDA and RIMA to diagonal. There was significant thrombotic lesion in SVG to OM 2 which was treated successfully with drug-eluting stent placement. Right heart catheterization showed normal filling pressures and cardiac output with moderate to severe aortic stenosis. Peak to peak gradient was 29 mmHg with a valve area of 0.91. Ejection fraction was normal. He had some improvement in anginal symptoms after that but was seen recently by Richardson Dopp due to worsening angina. He was noted to be mildly bradycardic. Metoprolol was decreased and Nitroglycerin was increased. Since then, he reports improvement in exertional chest pain but still has not resolved. He complains of substernal chest tightness with shortness of breath if he overexerts himself. He is usually very active. He also has been complaining of increased dizziness.     Past Medical History  Diagnosis Date  . Hyperlipidemia   . HTN (hypertension)   . Nephrolithiasis   . AS (aortic stenosis)     a. Echo 6/10: EF 55% mild AS; b. echo 06/2015; EF 55-60%, GR1DD, moderate AS, Peak velocity (S): 346 cm/s. Mean gradientS): 28 mm Hg. Peak  gradient (S): 48 mm Hg. Valve area (VTI): 1.18 cm2;   c. Echo 4/17 - mild LVH, EF 55-60%, no RWMA, Gr 1 DD, mod to severe AS (mean 28 mmHg, peak 46 mmHg), mild LAE  . Carotid bruit     2009 0-39% on dopplers bilatrally  . OSA (obstructive sleep apnea)   . Myocardial infarction ([place]) 1996  . Shortness of breath   . GERD (gastroesophageal reflux disease)   . HOH (hard of hearing)   . History of kidney stones   . CAD (coronary artery disease)     a. MI 1996 w/ CABG 1996; b.redo CABG 06/2003; c. Myoview 7/09: EF 54% inferobasal infarct, no ischemia. Myoview 6/10 EF 43% inf wall infarct. no ischemia; c. cath 8/16 s/p DES to VG-OM2. OTW 3VD w/ patent VG->RPDA, VG->OM3,  & LIMA->LAD.  EF 55-65%.  Marland Kitchen PAD (peripheral artery disease) ([place]) 02/2014    Subtotal occlusion of right common iliac artery and 70% stenosis in the left common iliac artery. Status post bilateral kissing stent placement. 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 Thrombocytopenia [facility])     Past Surgical History  Procedure Laterality Date  . Stomach surgery      removal of gastric ulcers  . Coronary artery bypass graft  1991    at [facility]. Redone 2004-3 vessels 1st time and 4  second time  . Inguinal hernia repair  11/10/2011    Procedure: HERNIA REPAIR INGUINAL ADULT;  Surgeon: Donato Heinz;  Location: [facility];  Service: General;  Laterality: Right;  . Left heart catheterization with coronary/graft angiogram N/A 03/06/2014    Procedure: LEFT HEART CATHETERIZATION WITH Beatrix Fetters;  Surgeon: Wellington Hampshire, MD;  Location: [facility] CATH LAB;  Service: Cardiovascular;  Laterality: N/A;  . Abdominal aortagram N/A 03/06/2014    Procedure: ABDOMINAL Maxcine Ham;  Surgeon: Wellington Hampshire, MD;  Location: [facility] CATH LAB;  Service: Cardiovascular;  Laterality: N/A;  .  Cardiac catheterization  02/2014    Severe three-vessel coronary artery disease with patent grafts.   . Cardiac catheterization N/A 07/31/2015    Procedure: Right/Left Heart Cath and Coronary/Graft Angiography;  Surgeon: Wellington Hampshire, MD;  Location: [facility] CV LAB;  Service: Cardiovascular;  Laterality: N/A;  . Cardiac catheterization N/A 07/31/2015    Procedure: Coronary Stent Intervention;  Surgeon: Wellington Hampshire, MD;  Location: [place] CV LAB;  Service: Cardiovascular;  Laterality: N/A;     Current Outpatient Prescriptions  Medication Sig Dispense Refill  . acetaminophen (TYLENOL) 325 MG tablet Take 650 mg by mouth daily as needed.    Marland Kitchen aspirin EC 81 MG tablet Take 1 tablet (81 mg total) by mouth daily. 90 tablet 3  . atorvastatin (LIPITOR) 80 MG tablet TAKE ONE TABLET BY MOUTH ONCE DAILY 90 tablet 3  . clopidogrel (PLAVIX) 75 MG tablet TAKE ONE TABLET BY MOUTH ONCE DAILY 30 tablet 6  . hydrochlorothiazide (HYDRODIURIL) 25 MG tablet TAKE ONE TABLET BY MOUTH ONCE DAILY 90 tablet 3  . isosorbide dinitrate (ISORDIL) 10 MG tablet Take 2 tablets (20 mg total) by mouth as directed. TAKE 2 TABS IN THE MORNING AND 1 TAB IN THE PM 270 tablet 3  . lisinopril (PRINIVIL,ZESTRIL) 20 MG tablet TAKE ONE TABLET BY MOUTH ONCE DAILY 90 tablet 3  . metoprolol tartrate (LOPRESSOR) 25 MG tablet Take 0.5 tablets (12.5 mg total) by mouth at bedtime.    . nitroGLYCERIN (NITROSTAT) 0.4 MG SL tablet Place 1 tablet (0.4 mg total) under the tongue every 5 (five) minutes as needed. 25 tablet 3  . pantoprazole (PROTONIX) 40 MG tablet Take 1 tablet (40 mg total) by mouth daily. 90 tablet 3  . tamsulosin (FLOMAX) 0.4 MG CAPS capsule Take 1 capsule (0.4 mg total) by mouth daily. 30 capsule 3   No current facility-administered medications for this visit.    Allergies:   Review of patient's allergies indicates no known allergies.    Social History:  The patient  reports that he quit smoking about 26  years ago. His smoking use included Cigarettes. He has a 50 pack-year smoking history. He does not have any smokeless tobacco history on file. He reports that he does not drink alcohol or use illicit drugs.   Family History:  The patient's family history includes Heart disease in his father and mother.    ROS:  Please see the history of present illness.   Otherwise, review of systems are positive for none.   All other systems are reviewed and negative.    PHYSICAL EXAM: VS:  BP 92/50 mmHg  Pulse 76  Ht 5\' 9"  (1.753 m)  Wt 190 lb (86.183 kg)  BMI 28.05 kg/m2 , BMI Body mass index is 28.05 kg/(m^2). GEN: Well nourished, well developed, in no acute distress HEENT: normal Neck: no JVD, carotid bruits, or masses Cardiac: RRR; no rubs, or  gallops,no edema . 3/6 crescendo decrescendo aortic murmur which is late peaking with absent S2. The murmur radiates to the carotid arteries. Respiratory:  clear to auscultation bilaterally, normal work of breathing GI: soft, nontender, nondistended, + BS MS: no deformity or atrophy Skin: warm and dry, no rash Neuro:  Strength and sensation are intact Psych: euthymic mood, full affect   EKG:  EKG is ordered today. The ekg ordered today demonstrates sinus rhythm with occasional PVCs. Old inferior MI.   Recent Labs: 08/11/2015: ALT 17 04/02/2016: BUN 16; Creat 0.99; Hemoglobin 15.2; Platelets 154; Potassium 3.7; Sodium 138    Lipid Panel    Component Value Date/Time   CHOL 149 08/11/2015 1402   CHOL 133 05/19/2009 2134   TRIG 224* 08/11/2015 1402   HDL 41 08/11/2015 1402   HDL 40 05/19/2009 2134   CHOLHDL 3.6 08/11/2015 1402   CHOLHDL 3.3 Ratio 05/19/2009 2134   VLDL 18 05/19/2009 2134   LDLCALC 63 08/11/2015 1402   LDLCALC 75 05/19/2009 2134      Wt Readings from Last 3 Encounters:  04/12/16 190 lb (86.183 kg)  04/02/16 186 lb 1.9 oz (84.423 kg)  08/11/15 191 lb 8 oz (86.864 kg)        ASSESSMENT AND PLAN:  1.  Aortic stenosis:  By physical exam, the aortic stenosis appears to be severe. By gradient, aortic stenosis is moderate but valve area is less than 1 suggestive of severe stenosis both on cardiac catheterization from August 2016 and recent echocardiogram from last week which showed normal LV systolic function with an aortic valve mean gradient of 28 mmHg and valve area of 0.94 cm. The patient's symptoms are consistent with class II angina with only slight improvement after increasing nitroglycerin and decreasing metoprolol due to bradycardia. Thus, I'm referring the patient to the TAVR clinic for evaluation.  2. Coronary artery disease involving graft bypass with stable angina: Status post drug-eluting stent placement to SVG to OM 2 in August 2016. Continue dual antiplatelet therapy.  3. Essential hypertension: He is mildly hypotensive and is having dizziness. I discontinued hydrochlorothiazide especially he has significant aortic stenosis.  4. Peripheral arterial disease: No recurrent claudication since bilateral iliac stenting.       Disposition:   FU with me in 6 months  Signed,  [name], MD  04/12/2016 10:48 AM    [place]

## 2016-04-14 NOTE — Telephone Encounter (Signed)
Tried to reach pt last week before I was out of the office on 4/27-5/2, though did not reach pt to go over his echo results. Pt saw Dr. Fletcher Anon on 5/1 in our [facility] office and has been referred by Dr. Fletcher Anon to Dr. Burt Knack for TAVR procedure.

## 2016-05-03 ENCOUNTER — Encounter: Payer: Self-pay | Admitting: Cardiovascular Disease

## 2016-05-03 ENCOUNTER — Ambulatory Visit (INDEPENDENT_AMBULATORY_CARE_PROVIDER_SITE_OTHER): Payer: Medicare Other | Admitting: Cardiovascular Disease

## 2016-05-03 VITALS — BP 130/60 | HR 64 | Ht 69.0 in | Wt 192.1 lb

## 2016-05-03 DIAGNOSIS — I350 Nonrheumatic aortic (valve) stenosis: Secondary | ICD-10-CM

## 2016-05-03 NOTE — Progress Notes (Signed)
Cardiology Office Note Date:  05/03/2016   ID:  [name], DOB [birth year], MRN [id number]  PCP:  Antionette Fairy PA-C  Cardiologist:  Dr Fletcher Anon  Chief Complaint  Patient presents with  . New Evaluation    TAVR consult. c/o sob with exertion   History of Present Illness: Matthew [name] is a 80 y.o. male who presents for evaluation of aortic stenosis. He's been followed by Dr. Fletcher Anon. The patient has extensive coronary artery disease and he has undergone 2 bypass surgeries in the past. His initial surgery was in 1991. His redo CABG was in 2004. He underwent cardiac catheterization last year after he presented with unstable angina. He was noted to have continued patency of the LIMA to LAD, saphenous vein graft OM 2, saphenous vein graft OM 3, saphenous vein graft to the right PDA, and RIMA to diagonal. There was a high-grade stenosis in the saphenous vein graft OM 2 treated with a drug-eluting stent. At that time his peak to peak transaortic gradient was 29 mmHg with a calculated aortic valve area of 0.91 based on Fick cardiac output and the Gorlin formula.  He complains of shortness of breath when he 'gets in a hurry.' Also has 'hurting in the chest' with exertion.  His wife states he can only walk a short distance before he's 'give out' with shortness of breath. Exertional dyspnea has been present for years but has slowly progressed. 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 He denies orthopnea or PND, but he does have some ankle swelling.   He's also had problems with intermittent claudication and has undergone bilateral iliac stenting. Claudication symptoms have resolved since his PTA procedure.   The patient is very hard of hearing. His wife helps with providing details of his history. 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 He denies lightheadedness or syncope.   Past  Medical History  Diagnosis Date  . Hyperlipidemia   . HTN (hypertension)   . Nephrolithiasis   . AS (aortic stenosis)     a. Echo 6/10: EF 55% mild AS; b. echo 06/2015; EF 55-60%, GR1DD, moderate AS, Peak velocity (S): 346 cm/s. Mean gradientS): 28 mm Hg. Peak gradient (S): 48 mm Hg. Valve area (VTI): 1.18 cm2;   c. Echo 4/17 - mild LVH, EF 55-60%, no RWMA, Gr 1 DD, mod to severe AS (mean 28 mmHg, peak 46 mmHg), mild LAE  . Carotid bruit     2009 0-39% on dopplers bilatrally  . OSA (obstructive sleep apnea)   . Myocardial infarction ([place]) 1996  . Shortness of breath   . GERD (gastroesophageal reflux disease)   . HOH (hard of hearing)   . History of kidney stones   . CAD (coronary artery disease)     a. MI 1996 w/ CABG 1996; b.redo CABG 06/2003; c. Myoview 7/09: EF 54% inferobasal infarct, no ischemia. Myoview 6/10 EF 43% inf wall infarct. no ischemia; c. cath 8/16 s/p DES to VG-OM2. OTW 3VD w/ patent VG->RPDA, VG->OM3,  & LIMA->LAD.  EF 55-65%.  Marland Kitchen PAD (peripheral artery disease) ([place]) 02/2014    Subtotal occlusion of right common iliac artery and 70% stenosis in the left common iliac artery. Status post bilateral kissing stent placement. Significant post stenosis aneurysmal dilatation on the right side (any future catheterization through the right femoral artery should be done cautiously to  avoid advancing the wire behind the stent struts)  . Thrombocytopenia [facility])     Past Surgical History  Procedure Laterality Date  . Stomach surgery      removal of gastric ulcers  . Coronary artery bypass graft  1991    at [facility]. Redone 2004-3 vessels 1st time and 4 second time  . Inguinal hernia repair  11/10/2011    Procedure: HERNIA REPAIR INGUINAL ADULT;  Surgeon: Donato Heinz;  Location: [facility];  Service: General;  Laterality: Right;  . Left heart catheterization with coronary/graft angiogram N/A 03/06/2014    Procedure: LEFT HEART CATHETERIZATION WITH Beatrix Fetters;  Surgeon: Wellington Hampshire, MD;  Location: [facility] CATH LAB;  Service: Cardiovascular;  Laterality: N/A;  . Abdominal aortagram N/A 03/06/2014    Procedure: ABDOMINAL Maxcine Ham;  Surgeon: Wellington Hampshire, MD;  Location: [place] CATH LAB;  Service: Cardiovascular;  Laterality: N/A;  . Cardiac catheterization  02/2014    Severe three-vessel coronary artery disease with patent grafts.   . Cardiac catheterization N/A 07/31/2015    Procedure: Right/Left Heart Cath and Coronary/Graft Angiography;  Surgeon: Wellington Hampshire, MD;  Location: [place] CV LAB;  Service: Cardiovascular;  Laterality: N/A;  . Cardiac catheterization N/A 07/31/2015    Procedure: Coronary Stent Intervention;  Surgeon: Wellington Hampshire, MD;  Location: [place] CV LAB;  Service: Cardiovascular;  Laterality: N/A;    Current Outpatient Prescriptions  Medication Sig Dispense Refill  . acetaminophen (TYLENOL) 325 MG tablet Take 650 mg by mouth daily as needed for mild pain or headache.     Marland Kitchen aspirin EC 81 MG tablet Take 1 tablet (81 mg total) by mouth daily. 90 tablet 3  . atorvastatin (LIPITOR) 80 MG tablet TAKE ONE TABLET BY MOUTH ONCE DAILY 90 tablet 3  . clopidogrel (PLAVIX) 75 MG tablet TAKE ONE TABLET BY MOUTH ONCE DAILY 30 tablet 6  . isosorbide dinitrate (ISORDIL) 10 MG tablet Take 2 tablets (20 mg total) by mouth as directed. TAKE 2 TABS IN THE MORNING AND 1 TAB IN THE PM 270 tablet 3  . lisinopril (PRINIVIL,ZESTRIL) 20 MG tablet TAKE ONE TABLET BY MOUTH ONCE DAILY 90 tablet 3  . metoprolol tartrate (LOPRESSOR) 25 MG tablet Take 0.5 tablets (12.5 mg total) by mouth at bedtime.    . nitroGLYCERIN (NITROSTAT) 0.4 MG SL tablet Place 0.4 mg under the tongue every 5 (five) minutes as needed for chest pain.    . pantoprazole (PROTONIX) 40 MG tablet Take 1 tablet (40 mg total) by mouth daily. 90 tablet 3  . tamsulosin (FLOMAX) 0.4 MG CAPS capsule Take 1 capsule (0.4 mg total) by mouth daily. 30 capsule 3   No current facility-administered  medications for this visit.    Allergies:   Review of patient's allergies indicates no known allergies.   Social History:  The patient  reports that he quit smoking about 26 years ago. His smoking use included Cigarettes. He has a 50 pack-year smoking history. He does not have any smokeless tobacco history on file. He reports that he does not drink alcohol or use illicit drugs.   Family History:  The patient's  family history includes Heart disease in his father and mother.    ROS:  Please see the history of present illness.  Otherwise, review of systems is positive for leg swelling, diarrhea, hard of hearing, easy bruising, snoring.  All other systems are reviewed and negative.    PHYSICAL EXAM: VS:  BP 130/60 mmHg  Pulse 64  Ht 5\' 9"  (1.753 m)  Wt 192 lb 1.9 oz (87.145 kg)  BMI 28.36 kg/m2 , BMI Body mass index is 28.36 kg/(m^2). GEN: Well nourished, well developed, in no acute distress HEENT: normal Neck: no JVD, no masses. bilateral carotid bruits Cardiac: RRR with 3/6 late-peaking harsh systolic murmur with diminished A2            Respiratory:  clear to auscultation bilaterally, normal work of breathing GI: soft, nontender, nondistended, + BS MS: no deformity or atrophy Ext: no pretibial edema Skin: warm and dry, no rash Neuro:  Strength and sensation are intact Psych: euthymic mood, full affect  EKG:  EKG is not ordered today.  Recent Labs: 08/11/2015: ALT 17 04/02/2016: BUN 16; Creat 0.99; Hemoglobin 15.2; Platelets 154; Potassium 3.7; Sodium 138   Lipid Panel     Component Value Date/Time   CHOL 149 08/11/2015 1402   CHOL 133 05/19/2009 2134   TRIG 224* 08/11/2015 1402   HDL 41 08/11/2015 1402   HDL 40 05/19/2009 2134   CHOLHDL 3.6 08/11/2015 1402   CHOLHDL 3.3 Ratio 05/19/2009 2134   VLDL 18 05/19/2009 2134   LDLCALC 63 08/11/2015 1402   LDLCALC 75 05/19/2009 2134      Wt Readings from Last 3 Encounters:  05/03/16 192 lb 1.9 oz (87.145 kg)  04/12/16 190  lb (86.183 kg)  04/02/16 186 lb 1.9 oz (84.423 kg)     Cardiac Studies Reviewed: Cardiac Cath 05/05/2015: Procedures    Coronary Stent Intervention   Right/Left Heart Cath and Coronary/Graft Angiography    Conclusion     Prox RCA lesion, 100% stenosed.  SVG was injected is normal in caliber.  There is mild disease in the graft.  Mid Cx lesion, 100% stenosed.  SVG was injected is normal in caliber.  The graft exhibits minimal luminal irregularities.  was injected is normal in caliber.  There is severe focal disease in the graft.  2nd Mrg lesion, 80% stenosed.  LIMA was visualized by non-selective angiography due to inability to cannulate and is normal in caliber, and is anatomically normal.  Prox LAD lesion, 100% stenosed.  Prox Graft lesion, 85% stenosed. There is a 0% residual stenosis post intervention. The lesion was not previously treated.  A drug-eluting stent was placed.  The left ventricular systolic function is normal.  1. Severe underlying three-vessel coronary artery disease with patent grafts including LIMA to LAD, SVG to OM2, SVG to OM 3, SVG to right PDA and RIMA to diagonal. There is significant thrombotic lesion and SVG to OM 2. The RIMA could not be injected due to severe tortuosity of the right innominate artery but there is known to be patent. 2. Normal filling pressures and cardiac output. Moderate to severe aortic stenosis. This has progressed since last study with a peak to peak gradient of 29 mmHg and valve area of 0.91. 3. Normal LV systolic function. 4. Successful drug-eluting stent placement to SVG to OM 2.  Recommendations: Dual antiplatelets therapy for at least one year. The patient will likely require intervention in his aortic valve in the near future most likely via TAVR.  Please note that there was difficulty in advancing the catheters via the right common iliac artery stent due to severe post stenotic dilatation. In the future,  should consider access via the left common femoral artery.   2D Echo 04-07-2015: Left ventricle: The cavity size was normal. There was mild concentric hypertrophy. Systolic function was normal. The estimated  ejection fraction was in the range of 55% to 60%. Wall motion was normal; there were no regional wall motion abnormalities. Doppler parameters are consistent with abnormal left ventricular relaxation (grade 1 diastolic dysfunction).  ------------------------------------------------------------------- Aortic valve: Trileaflet; mildly thickened, moderately calcified leaflets. Mobility was not restricted. Doppler: There was moderate to severe stenosis. There was mild regurgitation. VTI ratio of LVOT to aortic valve: 0.25. Valve area (VTI): 0.94 cm^2. Indexed valve area (VTI): 0.46 cm^2/m^2. Peak velocity ratio of LVOT to aortic valve: 0.26. Valve area (Vmax): 0.89 cm^2. Indexed valve area (Vmax): 0.44 cm^2/m^2. Mean velocity ratio of LVOT to aortic valve: 0.24. Valve area (Vmean): 0.84 cm^2. Indexed valve area (Vmean): 0.41 cm^2/m^2. Mean gradient (S): 28 mm Hg. Peak gradient (S): 46 mm Hg.  ------------------------------------------------------------------- Aorta: Aortic root: The aortic root was normal in size.  ------------------------------------------------------------------- Mitral valve: Structurally normal valve. Mobility was not restricted. Doppler: Transvalvular velocity was within the normal range. There was no evidence for stenosis. There was no regurgitation. Peak gradient (D): 2 mm Hg.  ------------------------------------------------------------------- Left atrium: The atrium was mildly dilated.  ------------------------------------------------------------------- Right ventricle: The cavity size was normal. Wall thickness was normal. Systolic function was  normal.  ------------------------------------------------------------------- Pulmonic valve: Doppler: Transvalvular velocity was within the normal range. There was no evidence for stenosis. There was trivial regurgitation.  ------------------------------------------------------------------- Tricuspid valve: Structurally normal valve. Doppler: Transvalvular velocity was within the normal range. There was trivial regurgitation.  ------------------------------------------------------------------- Pulmonary artery: The main pulmonary artery was normal-sized. Systolic pressure was within the normal range.  ------------------------------------------------------------------- Right atrium: The atrium was normal in size.  ------------------------------------------------------------------- Pericardium: There was no pericardial effusion.  ------------------------------------------------------------------- Systemic veins: Inferior vena cava: The vessel was normal in size.  STS Risk Calculator: Procedure: AV Replacement  Risk of Mortality: 3.892%  Morbidity or Mortality: 22.934%  Long Length of Stay: 7.87%  Short Length of Stay: 27.869%  Permanent Stroke: 2.572%  Prolonged Ventilation: 14.05%  DSW Infection: 0.428%  Renal Failure: 5.055%  Reoperation: 9.025%   ASSESSMENT AND PLAN: 80 yo male with Stage D Severe aortic stenosis: I have personally reviewed the patient's cardiac catheterization, peripheral vascular angiogram studies and interventional procedures, and 2-D echocardiogram images. Despite the fact that his transvalvular gradients are in the moderate range, his aortic valve area is less than 1 cm and his symptoms/physical exam are clearly consistent with severe aortic stenosis. He has both symptoms of angina and progressive shortness of breath. I have reviewed the natural history of aortic stenosis with the patient and his wife who is present today. We have  discussed the limitations of medical therapy and the poor prognosis associated with symptomatic aortic stenosis. We have reviewed potential treatment options, including palliative medical therapy, conventional surgical aortic valve replacement, and transcatheter aortic valve replacement. We discussed treatment options in the context of this patient's specific comorbid medical conditions.   With 2 prior CABG surgeries, I think TAVR would be a good treatment option for this patient, potentially associated with less morbidity than redo surgical aortic valve replacement. Will refer him to Dr Servando Snare who did his most recent CABG for a formal surgical consultation. Will arrange CTA studies of the heart and of the chest/abdomen/pelvis. I suspect he will need repeat cardiac cath since he had vein graft PCI last August.  Current medicines are reviewed with the patient today.  The patient does not have concerns regarding medicines.  Labs/ tests ordered today include:  No orders of the defined types were placed in this encounter.  Disposition:   FU after studies/surgical evaluation completed  Signed, Sherren Mocha, MD  05/03/2016 1:40 PM    [facility] [place], [place], [place]  [address] Phone: [phone]; Fax: [phone]

## 2016-05-03 NOTE — Patient Instructions (Addendum)
Medication Instructions:  Your physician recommends that you continue on your current medications as directed. Please refer to the Current Medication list given to you today.  Labwork: No new orders.   Testing/Procedures: Non-Cardiac CT Angiography (CTA), is a special type of CT scan that uses a computer to produce multi-dimensional views of major blood vessels throughout the body. In CT angiography, a contrast material is injected through an IV to help visualize the blood vessels  Your physician has requested that you have cardiac CT. Cardiac computed tomography (CT) is a painless test that uses an x-ray machine to take clear, detailed pictures of your heart. For further information please visit [email or web address].   Friday May 07, 2016 at [facility], Entrance A--please arrive in admitting at 9:15 AM, testing will begin at 9:30 AM No solid foods, caffeine or smoking 4 hours prior to testing. No herbal supplements 24 hours prior to testing. No sexual enhancement drugs 72 hours prior to test.   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     If you need a refill on your cardiac medications before your next appointment, please call your pharmacy.

## 2016-05-07 ENCOUNTER — Encounter (HOSPITAL_COMMUNITY): Payer: Self-pay

## 2016-05-07 ENCOUNTER — Ambulatory Visit (HOSPITAL_COMMUNITY)
Admission: RE | Admit: 2016-05-07 | Discharge: 2016-05-07 | Disposition: A | Discharge status: Home / Self Care | Payer: Medicare Other | Source: Ambulatory Visit | Attending: Cardiovascular Disease | Admitting: Cardiovascular Disease

## 2016-05-07 DIAGNOSIS — Z951 Presence of aortocoronary bypass graft: Secondary | ICD-10-CM | POA: Diagnosis not present

## 2016-05-07 DIAGNOSIS — K5730 Diverticulosis of large intestine without perforation or abscess without bleeding: Secondary | ICD-10-CM | POA: Diagnosis not present

## 2016-05-07 DIAGNOSIS — I350 Nonrheumatic aortic (valve) stenosis: Secondary | ICD-10-CM | POA: Insufficient documentation

## 2016-05-07 DIAGNOSIS — I358 Other nonrheumatic aortic valve disorders: Secondary | ICD-10-CM | POA: Diagnosis not present

## 2016-05-07 DIAGNOSIS — I2510 Atherosclerotic heart disease of native coronary artery without angina pectoris: Secondary | ICD-10-CM | POA: Diagnosis not present

## 2016-05-07 MED ORDER — IOPAMIDOL (ISOVUE-370) INJECTION 76%
INTRAVENOUS | Status: AC
  Filled 2016-05-07: qty 50

## 2016-05-07 MED ORDER — IOPAMIDOL (ISOVUE-370) INJECTION 76%
INTRAVENOUS | Status: AC
  Administered 2016-05-07: 80 mL
  Filled 2016-05-07: qty 50

## 2016-05-07 MED ORDER — IOPAMIDOL (ISOVUE-370) INJECTION 76%
INTRAVENOUS | Status: AC
  Filled 2016-05-07: qty 100

## 2016-05-07 MED ORDER — IOPAMIDOL (ISOVUE-370) INJECTION 76%
INTRAVENOUS | Status: AC
  Administered 2016-05-07: 70 mL
  Filled 2016-05-07: qty 100

## 2016-05-17 ENCOUNTER — Encounter: Payer: Self-pay | Admitting: Cardiothoracic Surgery

## 2016-05-17 ENCOUNTER — Other Ambulatory Visit: Payer: Self-pay | Admitting: *Deleted

## 2016-05-17 ENCOUNTER — Institutional Professional Consult (permissible substitution) (INDEPENDENT_AMBULATORY_CARE_PROVIDER_SITE_OTHER): Payer: Medicare Other | Admitting: Cardiothoracic Surgery

## 2016-05-17 VITALS — BP 120/69 | HR 64 | Resp 16

## 2016-05-17 DIAGNOSIS — I25119 Atherosclerotic heart disease of native coronary artery with unspecified angina pectoris: Secondary | ICD-10-CM | POA: Diagnosis not present

## 2016-05-17 DIAGNOSIS — I350 Nonrheumatic aortic (valve) stenosis: Secondary | ICD-10-CM | POA: Diagnosis not present

## 2016-05-17 NOTE — Progress Notes (Signed)
[address]       ,[place] [address]             [id number]                    Matthew [name] Baring Medical Record [id number] Date of Birth: 1935-12-26  Referring: Sherren Mocha, MD Primary Care: Antionette Fairy, PA-C  Chief Complaint:    Chief Complaint  Patient presents with  . Aortic Stenosis    Severe...eval for TAVR vs. AVR,,,CATHH 07/21/15, ECHO 04/06/16    History of Present Illness:    CHESS TREVETT 80 y.o. male is seen in the office  today for 80 y.o. male with a hx of CAD status post CABG in 1996 in the setting of myocardial infarction, redo bypass in 2004, moderate aortic stenosis, PAD status post bilateral iliac stenting in the past, HTN, HL. LHC in 8/16 demonstrated patent LIMA-LAD, patent SVG-OM2, patent SVG-OM3, patent SVG-RPDA, patent RIMA-Dx. Patient did have a thrombotic lesion in the SVG-OM2 which was treated with a DES. Echo in 7/16 with mean gradient 28 mmHg.   Over the past 6 months the patient has had increased SOB with exertion. He still cutts his grass with riding mower but is unable to do anything else around his yard as he had been doing. He notes SOB with exerction, and associated chest pain , but not at rest.   Current Activity/ Functional Status:  Patient is independent with mobility/ambulation, transfers, ADL's, IADL's.   Zubrod Score: At the time of surgery this patient's most appropriate activity status/level should be described as: []     0    Normal activity, no symptoms []     1    Restricted in physical strenuous activity but ambulatory, able to do out light work []     2    Ambulatory and capable of self care, unable to do work activities, up and about               >50 % of waking hours                              []     3    Only limited self care, in bed greater than 50% of waking hours []     4    Completely disabled, no self care, confined to bed or chair []     5    Moribund   Past Medical History    Diagnosis Date  . Hyperlipidemia   . HTN (hypertension)   . Nephrolithiasis   . AS (aortic stenosis)     a. Echo 6/10: EF 55% mild AS; b. echo 06/2015; EF 55-60%, GR1DD, moderate AS, Peak velocity (S): 346 cm/s. Mean gradientS): 28 mm Hg. Peak gradient (S): 48 mm Hg. Valve area (VTI): 1.18 cm2;   c. Echo 4/17 - mild LVH, EF 55-60%, no RWMA, Gr 1 DD, mod to severe AS (mean 28 mmHg, peak 46 mmHg), mild LAE  . Carotid bruit     2009 0-39% on dopplers bilatrally  . OSA (obstructive sleep apnea)   . Myocardial infarction ([place]) 1996  . Shortness of breath   . GERD (gastroesophageal reflux disease)   . HOH (hard of hearing)   . History of kidney stones   . CAD (coronary artery disease)     a. MI 1996 w/ CABG 1996; b.redo CABG 06/2003;  c. Myoview 7/09: EF 54% inferobasal infarct, no ischemia. Myoview 6/10 EF 43% inf wall infarct. no ischemia; c. cath 8/16 s/p DES to VG-OM2. OTW 3VD w/ patent VG->RPDA, VG->OM3,  & LIMA->LAD.  EF 55-65%.  Marland Kitchen PAD (peripheral artery disease) ([place]) 02/2014    Subtotal occlusion of right common iliac artery and 70% stenosis in the left common iliac artery. Status post bilateral kissing stent placement. 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 Thrombocytopenia [facility])     Past Surgical History  Procedure Laterality Date  . Stomach surgery      removal of gastric ulcers  . Coronary artery bypass graft  1991    at [facility]. Redone 2004-3 vessels 1st time and 4 second time  . Inguinal hernia repair  11/10/2011    Procedure: HERNIA REPAIR INGUINAL ADULT;  Surgeon: Donato Heinz;  Location: [facility] ORS;  Service: General;  Laterality: Right;  . Left heart catheterization with coronary/graft angiogram N/A 03/06/2014    Procedure: LEFT HEART CATHETERIZATION WITH Beatrix Fetters;  Surgeon: Wellington Hampshire, MD;   Location: [facility] CATH LAB;  Service: Cardiovascular;  Laterality: N/A;  . Abdominal aortagram N/A 03/06/2014    Procedure: ABDOMINAL Maxcine Ham;  Surgeon: Wellington Hampshire, MD;  Location: [place] CATH LAB;  Service: Cardiovascular;  Laterality: N/A;  . Cardiac catheterization  02/2014    Severe three-vessel coronary artery disease with patent grafts.   . Cardiac catheterization N/A 07/31/2015    Procedure: Right/Left Heart Cath and Coronary/Graft Angiography;  Surgeon: Wellington Hampshire, MD;  Location: [place] CV LAB;  Service: Cardiovascular;  Laterality: N/A;  . Cardiac catheterization N/A 07/31/2015    Procedure: Coronary Stent Intervention;  Surgeon: Wellington Hampshire, MD;  Location: [place] CV LAB;  Service: Cardiovascular;  Laterality: N/A;    Family History  Problem Relation Age of Onset  . Heart disease Father   . Heart disease Mother     Social History   Social History  . Marital Status: Married    Spouse Name: N/A  . Number of Children: N/A  . Years of Education: N/A   Occupational History  . Not on file.   Social History Main Topics  . Smoking status: Former Smoker -- 1.00 packs/day for 50 years    Types: Cigarettes    Quit date: 01/24/1990  . Smokeless tobacco: Not on file     Comment: quit in early 90's  . Alcohol Use: No  . Drug Use: No  . Sexual Activity: Not on file   Other Topics Concern  . Not on file   Social History Narrative   Married an lives with wife in [place]. Retired form [employer].     History  Smoking status  . Former Smoker -- 1.00 packs/day for 50 years  . Types: Cigarettes  . Quit date: 01/24/1990  Smokeless tobacco  . Not on file    Comment: quit in early 90's    History  Alcohol Use No     No Known Allergies  Current Outpatient Prescriptions  Medication Sig Dispense Refill  . acetaminophen (TYLENOL) 325 MG tablet Take 650 mg by mouth daily as needed for mild pain or headache.     Marland Kitchen aspirin EC 81 MG tablet Take  1 tablet (81 mg total) by mouth daily. 90 tablet 3  . atorvastatin (LIPITOR) 80 MG  tablet TAKE ONE TABLET BY MOUTH ONCE DAILY 90 tablet 3  . clopidogrel (PLAVIX) 75 MG tablet TAKE ONE TABLET BY MOUTH ONCE DAILY 30 tablet 6  . isosorbide dinitrate (ISORDIL) 10 MG tablet Take 2 tablets (20 mg total) by mouth as directed. TAKE 2 TABS IN THE MORNING AND 1 TAB IN THE PM 270 tablet 3  . lisinopril (PRINIVIL,ZESTRIL) 20 MG tablet TAKE ONE TABLET BY MOUTH ONCE DAILY 90 tablet 3  . metoprolol tartrate (LOPRESSOR) 25 MG tablet Take 0.5 tablets (12.5 mg total) by mouth at bedtime.    . Multiple Vitamins-Minerals (PRESERVISION AREDS PO) Take 1 tablet by mouth 2 (two) times daily.    . nitroGLYCERIN (NITROSTAT) 0.4 MG SL tablet Place 0.4 mg under the tongue every 5 (five) minutes as needed for chest pain.    . pantoprazole (PROTONIX) 40 MG tablet Take 1 tablet (40 mg total) by mouth daily. 90 tablet 3  . tamsulosin (FLOMAX) 0.4 MG CAPS capsule Take 1 capsule (0.4 mg total) by mouth daily. 30 capsule 3   No current facility-administered medications for this visit.      Review of Systems:     Cardiac Review of Systems: Y or N  Chest Pain [  y  ]  Resting SOB [n   ] Exertional SOB  [ y ]  Vertell Limber Florencio.Farrier  ]   Pedal Edema Blue.Reese   ]    Palpitations [ n ] Syncope  [  n]   Presyncope [  n ]  General Review of Systems: [Y] = yes [  ]=no Constitional: recent weight change [  ];  Wt loss over the last 3 months [   ] anorexia [  ]; fatigue [  ]; nausea [  ]; night sweats [  ]; fever [  ]; or chills [  ];          Dental: poor dentition[  ]; Last Dentist visit:   Eye : blurred vision [  ]; diplopia [   ]; vision changes [  ];  Amaurosis fugax[  ]; Resp: cough [  ];  wheezing[  ];  hemoptysis[  ]; shortness of breath[ y ]; paroxysmal nocturnal dyspnea[  ]; dyspnea on exertion[y  ]; or orthopnea[  ];  GI:  gallstones[  ], vomiting[  ];  dysphagia[  ]; melena[  ];  hematochezia [  ]; heartburn[  ];   Hx of  Colonoscopy[   ]; GU: kidney stones [  ]; hematuria[  ];   dysuria [  ];  nocturia[  ];  history of     obstruction [  ]; urinary frequency [  ]             Skin: rash, swelling[  ];, hair loss[  ];  peripheral edema[  ];  or itching[  ]; Musculosketetal: myalgias[  ];  joint swelling[  ];  joint erythema[  ];  joint pain[  ];  back pain[  ];  Heme/Lymph: bruising[  ];  bleeding[  ];  anemia[  ];  Neuro: TIA[  ];  headaches[  ];  stroke[ n ];  vertigo[  ];  seizures[  ];   paresthesias[  ];  difficulty walking[n  ];  Psych:depression[  ]; anxiety[  ];  Endocrine: diabetes[  ];  thyroid dysfunction[  ];  Immunizations: Flu up to date [  ]; Pneumococcal up to date [  ];  Other:  Physical Exam: BP 120/69 mmHg  Pulse  64  Resp 16  SpO2 95%  PHYSICAL EXAMINATION: General appearance: alert, cooperative and appears older than stated age Head: Normocephalic, without obvious abnormality, atraumatic Neck: no adenopathy, no carotid bruit, no JVD, supple, symmetrical, trachea midline and thyroid not enlarged, symmetric, no tenderness/mass/nodules Lymph nodes: Cervical, supraclavicular, and axillary nodes normal. Resp: clear to auscultation bilaterally Back: symmetric, no curvature. ROM normal. No CVA tenderness. Cardio: systolic murmur: holosystolic 4/6, crescendo throughout the precordium, radiates to back GI: soft, non-tender; bowel sounds normal; no masses,  no organomegaly Extremities: extremities normal, atraumatic, no cyanosis or edema and Homans sign is negative, no sign of DVT Neurologic: Grossly normal Heard of hearing   Diagnostic Studies & Laboratory data:     Recent Radiology Findings:   Ct Angio Chest W/cm &/or Wo Cm  05/07/2016  CLINICAL DATA:  80 year old male with history of severe aortic stenosis. Preprocedural study prior to potential transcatheter aortic valve replacement (TAVR) procedure. EXAM: CT ANGIOGRAPHY CHEST, ABDOMEN AND PELVIS TECHNIQUE: Multidetector CT imaging through the chest,  abdomen and pelvis was performed using the standard protocol during bolus administration of intravenous contrast. Multiplanar reconstructed images and MIPs were obtained and reviewed to evaluate the vascular anatomy. CONTRAST:  75 mL of Isovue 370 COMPARISON:  CT the abdomen and pelvis 06/28/2013. FINDINGS: CTA CHEST FINDINGS Mediastinum/Lymph Nodes: Heart size is mildly enlarged. There is no significant pericardial fluid, thickening or pericardial calcification. There is atherosclerosis of the thoracic aorta, the great vessels of the mediastinum and the coronary arteries, including calcified atherosclerotic plaque in the left main, left anterior descending, left circumflex and right coronary arteries. Status post median sternotomy for CABG, including LIMA to the LAD. Severe thickening and calcifications of the aortic valve. Some borderline enlarged lymph nodes are noted in the hilar regions bilaterally, nonspecific and favored to be reactive. No pathologically enlarged mediastinal lymph nodes. Esophagus is normal in appearance. No axillary lymphadenopathy. Lungs/Pleura: Lung volumes appear low and there are some dependent areas of ground-glass attenuation in the lower lobes of the lungs bilaterally, favored to reflect areas of developing subsegmental atelectasis. No acute consolidative airspace disease. No pleural effusions. No suspicious appearing pulmonary nodules or masses. Musculoskeletal/Soft Tissues: Median sternotomy wires. There are no aggressive appearing lytic or blastic lesions noted in the visualized portions of the skeleton. CTA ABDOMEN AND PELVIS FINDINGS Hepatobiliary: No cystic or solid hepatic lesions. No intra or extrahepatic biliary ductal dilatation. Status post cholecystectomy. Pancreas: No pancreatic mass. No pancreatic ductal dilatation. No pancreatic or peripancreatic fluid or inflammatory changes. Spleen: Unremarkable. Adrenals/Urinary Tract: Mild diffuse cortical atrophy in both kidneys.  Multiple sub cm low-attenuation lesions in the kidneys bilaterally, too small to characterize, but statistically likely cysts. Exophytic 2 cm simple cyst in the lateral aspect of the interpolar region of the left kidney. No hydroureteronephrosis. Urinary bladder is unremarkable in appearance. Bilateral adrenal glands are normal in appearance. Stomach/Bowel: Normal appearance of the stomach. No pathologic dilatation of small bowel or colon. Several colonic diverticulae are noted, without surrounding inflammatory changes to suggest an acute diverticulitis at this time. Normal appendix. Vascular/Lymphatic: Extensive atherosclerosis throughout the abdominal and pelvic vasculature, with vascular findings and measurements pertinent to potential TAVR procedure, as detailed below. In addition, there is mild aneurysmal dilatation of the right common iliac artery which measures up to 1.8 cm in diameter. No lymphadenopathy noted in the abdomen or pelvis. Reproductive: Prostate gland and seminal vesicles are unremarkable in appearance. Other: No significant volume of ascites.  No pneumoperitoneum. Musculoskeletal: There are no aggressive  appearing lytic or blastic lesions noted in the visualized portions of the skeleton. VASCULAR MEASUREMENTS PERTINENT TO TAVR: AORTA: Minimal Aortic Diameter -  12 x 13 mm Severity of Aortic Calcification -  severe RIGHT PELVIS: Right Common Iliac Artery - Minimal Diameter - 6.4 x 6.0 mm Tortuosity - mild Calcification - severe Comment - There is a right common iliac artery stent in place, with the narrowest luminal diameter of 7.8 x 6.9 mm. Right External Iliac Artery - Minimal Diameter - 8.2 x 5.2 mm Tortuosity - moderate Calcification - mild Right Common Femoral Artery - Minimal Diameter - 6.2 x 8.2 mm Tortuosity - mild Calcification - mild-to-moderate LEFT PELVIS: Left Common Iliac Artery - Minimal Diameter - 4.7 x 4.3 mm Tortuosity - mild Calcification - severe Comment - There is a left  common iliac artery stent in place, with the narrowest luminal diameter of 4.7 x 4.3 mm. Left External Iliac Artery - Minimal Diameter - 6.5 x 7.5 mm Tortuosity - moderate Calcification - mild Left Common Femoral Artery - Minimal Diameter - 8.1 x 6.4 mm Tortuosity - mild Calcification - mild-to-moderate Review of the MIP images confirms the above findings. IMPRESSION: 1. Vascular findings and measurements pertinent to potential TAVR procedure, as detailed above. This patient appears to have suitable pelvic arterial access on the right side. However, bilateral common iliac artery stents are present, as discussed above. 2. Thickening calcification of the aortic valve, compatible with the reported clinical history of severe aortic stenosis. 3. Atherosclerosis, including left main and 3 vessel coronary artery disease. Status post median sternotomy for CABG, including LIMA to the LAD. 4. Colonic diverticulosis without evidence of acute diverticulitis at this time. 5. Additional incidental findings, as above. Electronically Signed   By: Vinnie Langton M.D.   On: 05/07/2016 13:05   Ct Coronary Morp W/cta Cor W/score W/ca W/cm &/or Wo/cm  05/07/2016  ADDENDUM REPORT: 05/07/2016 12:41 CLINICAL DATA:  Aortic stenosis EXAM: Cardiac TAVR CT TECHNIQUE: The patient was scanned on a Philips 256 scanner. A 120 kV retrospective scan was triggered in the descending thoracic aorta at 111 HU's. Gantry rotation speed was 270 msecs and collimation was .9 mm. No beta blockade or nitro were given. The 3D data set was reconstructed in 5% intervals of the R-R cycle. Systolic and diastolic phases were analyzed on a dedicated work station using MPR, MIP and VRT modes. The patient received 80 cc of contrast. FINDINGS: Aortic Valve:  Tri-leaflet and moderately calcified. Aorta: Moderate calcification of arch with normal origin of great vessels Previous CABG with SVG markers and LIMA Sinotubular Junction:  28 mm Ascending Thoracic Aorta:  33  mm Aortic Arch:  28 mm Descending Thoracic Aorta:  27 mm Sinus of Valsalva Measurements: Non-coronary:  33 mm Right -coronary:  34 mm Left -coronary:  37 mm Coronary Artery Height above Annulus: Left Main:  15 mm Right Coronary:  21 mm Virtual Basal Annulus Measurements: Maximum/Minimum Diameter:  22.7 mm x 29.4 mm Perimeter:  84 mm Area:  532 mm2 Coronary Arteries:  Sufficient height above annulus for deployment Optimum Fluoroscopic Angle for Delivery:  AP IMPRESSION: 1) Calcified trileaflet aortic valve with annulus 532 mm2 suitable for a 29 mm Sapien 3 valve 2) Sufficient coronary height for deployment 3) Optimum angiographic angle for deployment straight AP [place] Electronically Signed   By: Jenkins Rouge M.D.   On: 05/07/2016 12:41  05/07/2016  EXAM: OVER-READ INTERPRETATION  CT CHEST The following report is an over-read performed by  radiologist Dr. Rebekah Chesterfield [facility] on 05/07/2016. This over-read does not include interpretation of cardiac or coronary anatomy or pathology. The coronary calcium score/coronary CTA interpretation by the cardiologist is attached. COMPARISON:  No priors. FINDINGS: Extracardiac findings will be described separately on dictation for contemporaneously obtained CTA of the chest, abdomen and pelvis. IMPRESSION: Please see separate dictation for contemporaneously obtained CTA of the chest, abdomen and pelvis 05/07/2016 for full description of extracardiac findings. Electronically Signed: By: Vinnie Langton M.D. On: 05/07/2016 10:57   Ct Angio Abd/pel W/ And/or W/o  05/07/2016  CLINICAL DATA:  80 year old male with history of severe aortic stenosis. Preprocedural study prior to potential transcatheter aortic valve replacement (TAVR) procedure. EXAM: CT ANGIOGRAPHY CHEST, ABDOMEN AND PELVIS TECHNIQUE: Multidetector CT imaging through the chest, abdomen and pelvis was performed using the standard protocol during bolus administration of intravenous contrast.  Multiplanar reconstructed images and MIPs were obtained and reviewed to evaluate the vascular anatomy. CONTRAST:  75 mL of Isovue 370 COMPARISON:  CT the abdomen and pelvis 06/28/2013. FINDINGS: CTA CHEST FINDINGS Mediastinum/Lymph Nodes: Heart size is mildly enlarged. There is no significant pericardial fluid, thickening or pericardial calcification. There is atherosclerosis of the thoracic aorta, the great vessels of the mediastinum and the coronary arteries, including calcified atherosclerotic plaque in the left main, left anterior descending, left circumflex and right coronary arteries. Status post median sternotomy for CABG, including LIMA to the LAD. Severe thickening and calcifications of the aortic valve. Some borderline enlarged lymph nodes are noted in the hilar regions bilaterally, nonspecific and favored to be reactive. No pathologically enlarged mediastinal lymph nodes. Esophagus is normal in appearance. No axillary lymphadenopathy. Lungs/Pleura: Lung volumes appear low and there are some dependent areas of ground-glass attenuation in the lower lobes of the lungs bilaterally, favored to reflect areas of developing subsegmental atelectasis. No acute consolidative airspace disease. No pleural effusions. No suspicious appearing pulmonary nodules or masses. Musculoskeletal/Soft Tissues: Median sternotomy wires. There are no aggressive appearing lytic or blastic lesions noted in the visualized portions of the skeleton. CTA ABDOMEN AND PELVIS FINDINGS Hepatobiliary: No cystic or solid hepatic lesions. No intra or extrahepatic biliary ductal dilatation. Status post cholecystectomy. Pancreas: No pancreatic mass. No pancreatic ductal dilatation. No pancreatic or peripancreatic fluid or inflammatory changes. Spleen: Unremarkable. Adrenals/Urinary Tract: Mild diffuse cortical atrophy in both kidneys. Multiple sub cm low-attenuation lesions in the kidneys bilaterally, too small to characterize, but statistically  likely cysts. Exophytic 2 cm simple cyst in the lateral aspect of the interpolar region of the left kidney. No hydroureteronephrosis. Urinary bladder is unremarkable in appearance. Bilateral adrenal glands are normal in appearance. Stomach/Bowel: Normal appearance of the stomach. No pathologic dilatation of small bowel or colon. Several colonic diverticulae are noted, without surrounding inflammatory changes to suggest an acute diverticulitis at this time. Normal appendix. Vascular/Lymphatic: Extensive atherosclerosis throughout the abdominal and pelvic vasculature, with vascular findings and measurements pertinent to potential TAVR procedure, as detailed below. In addition, there is mild aneurysmal dilatation of the right common iliac artery which measures up to 1.8 cm in diameter. No lymphadenopathy noted in the abdomen or pelvis. Reproductive: Prostate gland and seminal vesicles are unremarkable in appearance. Other: No significant volume of ascites.  No pneumoperitoneum. Musculoskeletal: There are no aggressive appearing lytic or blastic lesions noted in the visualized portions of the skeleton. VASCULAR MEASUREMENTS PERTINENT TO TAVR: AORTA: Minimal Aortic Diameter -  12 x 13 mm Severity of Aortic Calcification -  severe RIGHT PELVIS: Right Common Iliac  Artery - Minimal Diameter - 6.4 x 6.0 mm Tortuosity - mild Calcification - severe Comment - There is a right common iliac artery stent in place, with the narrowest luminal diameter of 7.8 x 6.9 mm. Right External Iliac Artery - Minimal Diameter - 8.2 x 5.2 mm Tortuosity - moderate Calcification - mild Right Common Femoral Artery - Minimal Diameter - 6.2 x 8.2 mm Tortuosity - mild Calcification - mild-to-moderate LEFT PELVIS: Left Common Iliac Artery - Minimal Diameter - 4.7 x 4.3 mm Tortuosity - mild Calcification - severe Comment - There is a left common iliac artery stent in place, with the narrowest luminal diameter of 4.7 x 4.3 mm. Left External Iliac Artery  - Minimal Diameter - 6.5 x 7.5 mm Tortuosity - moderate Calcification - mild Left Common Femoral Artery - Minimal Diameter - 8.1 x 6.4 mm Tortuosity - mild Calcification - mild-to-moderate Review of the MIP images confirms the above findings. IMPRESSION: 1. Vascular findings and measurements pertinent to potential TAVR procedure, as detailed above. This patient appears to have suitable pelvic arterial access on the right side. However, bilateral common iliac artery stents are present, as discussed above. 2. Thickening calcification of the aortic valve, compatible with the reported clinical history of severe aortic stenosis. 3. Atherosclerosis, including left main and 3 vessel coronary artery disease. Status post median sternotomy for CABG, including LIMA to the LAD. 4. Colonic diverticulosis without evidence of acute diverticulitis at this time. 5. Additional incidental findings, as above. Electronically Signed   By: Vinnie Langton M.D.   On: 05/07/2016 13:05     I have independently reviewed the above radiologic studies.  Recent Lab Findings: Lab Results  Component Value Date   WBC 7.1 04/02/2016   HGB 15.2 04/02/2016   HCT 43.9 04/02/2016   PLT 154 04/02/2016   GLUCOSE 102* 04/02/2016   CHOL 149 08/11/2015   TRIG 224* 08/11/2015   HDL 41 08/11/2015   LDLCALC 63 08/11/2015   ALT 17 08/11/2015   AST 17 08/11/2015   NA 138 04/02/2016   K 3.7 04/02/2016   CL 102 04/02/2016   CREATININE 0.99 04/02/2016   BUN 16 04/02/2016   CO2 27 04/02/2016   INR 1.06 07/28/2015   CATH:  Cardiac Cath 05/05/2015: Procedures    Coronary Stent Intervention   Right/Left Heart Cath and Coronary/Graft Angiography    Conclusion     Prox RCA lesion, 100% stenosed.  SVG was injected is normal in caliber.  There is mild disease in the graft.  Mid Cx lesion, 100% stenosed.  SVG was injected is normal in caliber.  The graft exhibits minimal luminal irregularities.  was injected is  normal in caliber.  There is severe focal disease in the graft.  2nd Mrg lesion, 80% stenosed.  LIMA was visualized by non-selective angiography due to inability to cannulate and is normal in caliber, and is anatomically normal.  Prox LAD lesion, 100% stenosed.  Prox Graft lesion, 85% stenosed. There is a 0% residual stenosis post intervention. The lesion was not previously treated.  A drug-eluting stent was placed.  The left ventricular systolic function is normal.  1. Severe underlying three-vessel coronary artery disease with patent grafts including LIMA to LAD, SVG to OM2, SVG to OM 3, SVG to right PDA and RIMA to diagonal. There is significant thrombotic lesion and SVG to OM 2. The RIMA could not be injected due to severe tortuosity of the right innominate artery but there is known to be patent. 2.  Normal filling pressures and cardiac output. Moderate to severe aortic stenosis. This has progressed since last study with a peak to peak gradient of 29 mmHg and valve area of 0.91. 3. Normal LV systolic function. 4. Successful drug-eluting stent placement to SVG to OM 2.  Recommendations: Dual antiplatelets therapy for at least one year. The patient will likely require intervention in his aortic valve in the near future most likely via TAVR.  Please note that there was difficulty in advancing the catheters via the right common iliac artery stent due to severe post stenotic dilatation. In the future, should consider access via the left common femoral artery.   2D Echo 04-07-2015: Left ventricle: The cavity size was normal. There was mild concentric hypertrophy. Systolic function was normal. The estimated ejection fraction was in the range of 55% to 60%. Wall motion was normal; there were no regional wall motion abnormalities. Doppler parameters are consistent with abnormal left ventricular relaxation (grade 1 diastolic  dysfunction).  ------------------------------------------------------------------- Aortic valve: Trileaflet; mildly thickened, moderately calcified leaflets. Mobility was not restricted. Doppler: There was moderate to severe stenosis. There was mild regurgitation. VTI ratio of LVOT to aortic valve: 0.25. Valve area (VTI): 0.94 cm^2. Indexed valve area (VTI): 0.46 cm^2/m^2. Peak velocity ratio of LVOT to aortic valve: 0.26. Valve area (Vmax): 0.89 cm^2. Indexed valve area (Vmax): 0.44 cm^2/m^2. Mean velocity ratio of LVOT to aortic valve: 0.24. Valve area (Vmean): 0.84 cm^2. Indexed valve area (Vmean): 0.41 cm^2/m^2. Mean gradient (S): 28 mm Hg. Peak gradient (S): 46 mm Hg.  ------------------------------------------------------------------- Aorta: Aortic root: The aortic root was normal in size.  ------------------------------------------------------------------- Mitral valve: Structurally normal valve. Mobility was not restricted. Doppler: Transvalvular velocity was within the normal range. There was no evidence for stenosis. There was no regurgitation. Peak gradient (D): 2 mm Hg.  ------------------------------------------------------------------- Left atrium: The atrium was mildly dilated.  ------------------------------------------------------------------- Right ventricle: The cavity size was normal. Wall thickness was normal. Systolic function was normal.  ------------------------------------------------------------------- Pulmonic valve: Doppler: Transvalvular velocity was within the normal range. There was no evidence for stenosis. There was trivial regurgitation.  ------------------------------------------------------------------- Tricuspid valve: Structurally normal valve. Doppler:  Transvalvular velocity was within the normal range. There was trivial  regurgitation.  ------------------------------------------------------------------- Pulmonary artery: The main pulmonary artery was normal-sized. Systolic pressure was within the normal range.  ------------------------------------------------------------------- Right atrium: The atrium was normal in size.  ------------------------------------------------------------------- Pericardium: There was no pericardial effusion.  ------------------------------------------------------------------- Systemic veins: Inferior vena cava: The vessel was normal in size.       STS Risk Calculator: Procedure: AV Replacement  Risk of Mortality: 3.892%  Morbidity or Mortality: 22.934%  Long Length of Stay: 7.87%  Short Length of Stay: 27.869%  Permanent Stroke: 2.572%  Prolonged Ventilation: 14.05%  DSW Infection: 0.428%  Renal Failure: 5.055%  Reoperation: 9.025%    Assessment / Plan:   With patient 2 previous cardiac surgery and now need for AVR, with his age and limited functional status from symptomatic AS I agree with Dr Burt Knack than Lonia Farber would be the best recommendation for the patient care.     I  spent 40 minutes counseling the patient face to face and 50% or more the  time was spent in counseling and coordination of care. The total time spent in the appointment was 60 minutes.  Grace Isaac MD      [address] [place] [address] Office [phone]   Beeper [phone]  05/17/2016 2:48 PM

## 2016-05-19 ENCOUNTER — Other Ambulatory Visit: Payer: Self-pay | Admitting: *Deleted

## 2016-05-19 DIAGNOSIS — I350 Nonrheumatic aortic (valve) stenosis: Secondary | ICD-10-CM

## 2016-05-24 ENCOUNTER — Institutional Professional Consult (permissible substitution) (INDEPENDENT_AMBULATORY_CARE_PROVIDER_SITE_OTHER): Payer: Medicare Other | Admitting: Surgery

## 2016-05-24 ENCOUNTER — Encounter: Payer: Self-pay | Admitting: Surgery

## 2016-05-24 VITALS — BP 130/62 | HR 67 | Resp 19 | Ht 69.0 in | Wt 192.0 lb

## 2016-05-24 DIAGNOSIS — I350 Nonrheumatic aortic (valve) stenosis: Secondary | ICD-10-CM

## 2016-05-24 NOTE — Progress Notes (Signed)
Patient ID: Matthew [name], male   DOB: 1936/12/06, 80 y.o.   MRN: [id number]  [place] [facility] CONSULTATION REPORT  Referring Provider is Grace Isaac, MD PCP is Antionette Fairy, PA-C  Chief Complaint  Patient presents with  . Aortic Stenosis    2nd tavr eval, scheduled srgy on 6/20    HPI:  The patient is a 80 year old gentleman with hypertension, hyperlipidemia, aortic stenosis and a long history of coronary artery disease s/p CABG by Dr. Servando Snare in 1991 and redo CABG in 2004. He reports at least 9-12 months of shortness of breath and chest pain with exertion. He had a cardiac cath in 07/2015 that showed continued patency of the LIMA to LAD, saphenous vein graft to OM 2, saphenous vein graft to OM 3, saphenous vein graft to the right PDA, and RIMA to the diagonal. There was a high-grade stenosis in the saphenous vein graft to OM 2 that was treated with a drug-eluting stent. At that time his peak to peak transaortic gradient was 29 mmHg with a calculated aortic valve area of 0.91. He says that he continued to have the same symptoms after the PCI and over the past 3 months his wife has noticed that he gets very short of breath with any significant activity. His echo on 07/07/2015 showed a mean gradient of 28 mm Hg. A recent echo on 04/06/2016 showed a mean gradient that was still 28 mm Hg. The dimensionless index was 0.26 and the valve area 0.89 cm2. The LVEF was 55-60%.   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 He has some swelling in his legs. He denies dizziness or syncope.     Past Medical History  Diagnosis Date  . Hyperlipidemia   . HTN (hypertension)   . Nephrolithiasis   . AS (aortic stenosis)     a. Echo 6/10: EF 55%  mild AS; b. echo 06/2015; EF 55-60%, GR1DD, moderate AS, Peak velocity (S): 346 cm/s. Mean gradientS): 28 mm Hg. Peak gradient (S): 48 mm Hg. Valve area (VTI): 1.18 cm2;   c. Echo 4/17 - mild LVH, EF 55-60%, no RWMA, Gr 1 DD, mod to severe AS (mean 28 mmHg, peak 46 mmHg), mild LAE  . Carotid bruit     2009 0-39% on dopplers bilatrally  . OSA (obstructive sleep apnea)   . Myocardial infarction ([place]) 1996  . Shortness of breath   . GERD (gastroesophageal reflux disease)   . HOH (hard of hearing)   . History of kidney stones   . CAD (coronary artery disease)     a. MI 1996 w/ CABG 1996; b.redo CABG 06/2003; c. Myoview 7/09: EF 54% inferobasal infarct, no ischemia. Myoview 6/10 EF 43% inf wall infarct. no ischemia; c. cath 8/16 s/p DES to VG-OM2. OTW 3VD w/ patent VG->RPDA, VG->OM3,  & LIMA->LAD.  EF 55-65%.  Marland Kitchen PAD (peripheral artery disease) ([place]) 02/2014    Subtotal occlusion of right common iliac artery and 70% stenosis in the left common iliac artery. Status post bilateral kissing stent placement. Significant post stenosis aneurysmal dilatation on the right side (any future catheterization through the right femoral artery should be done cautiously to avoid advancing the wire behind the stent  struts)  . Thrombocytopenia [facility])     Past Surgical History  Procedure Laterality Date  . Stomach surgery      removal of gastric ulcers  . Coronary artery bypass graft  1991    at [facility]. Redone 2004-3 vessels 1st time and 4 second time  . Inguinal hernia repair  11/10/2011    Procedure: HERNIA REPAIR INGUINAL ADULT;  Surgeon: Donato Heinz;  Location: [facility] ORS;  Service: General;  Laterality: Right;  . Left heart catheterization with coronary/graft angiogram N/A 03/06/2014    Procedure: LEFT HEART CATHETERIZATION WITH Beatrix Fetters;  Surgeon: Wellington Hampshire, MD;  Location: [facility] CATH LAB;  Service: Cardiovascular;  Laterality: N/A;  . Abdominal aortagram N/A 03/06/2014    Procedure: ABDOMINAL  Maxcine Ham;  Surgeon: Wellington Hampshire, MD;  Location: [facility] CATH LAB;  Service: Cardiovascular;  Laterality: N/A;  . Cardiac catheterization  02/2014    Severe three-vessel coronary artery disease with patent grafts.   . Cardiac catheterization N/A 07/31/2015    Procedure: Right/Left Heart Cath and Coronary/Graft Angiography;  Surgeon: Wellington Hampshire, MD;  Location: [place] CV LAB;  Service: Cardiovascular;  Laterality: N/A;  . Cardiac catheterization N/A 07/31/2015    Procedure: Coronary Stent Intervention;  Surgeon: Wellington Hampshire, MD;  Location: [place] CV LAB;  Service: Cardiovascular;  Laterality: N/A;    Family History  Problem Relation Age of Onset  . Heart disease Father   . Heart disease Mother     Social History   Social History  . Marital Status: Married    Spouse Name: N/A  . Number of Children: N/A  . Years of Education: N/A   Occupational History  . Not on file.   Social History Main Topics  . Smoking status: Former Smoker -- 1.00 packs/day for 50 years    Types: Cigarettes    Quit date: 01/24/1990  . Smokeless tobacco: Not on file     Comment: quit in early 90's  . Alcohol Use: No  . Drug Use: No  . Sexual Activity: Not on file   Other Topics Concern  . Not on file   Social History Narrative   Married an lives with wife in [place]. Retired form [employer].     Current Outpatient Prescriptions  Medication Sig Dispense Refill  . acetaminophen (TYLENOL) 325 MG tablet Take 650 mg by mouth daily as needed for mild pain or headache.     Marland Kitchen aspirin EC 81 MG tablet Take 1 tablet (81 mg total) by mouth daily. 90 tablet 3  . atorvastatin (LIPITOR) 80 MG tablet TAKE ONE TABLET BY MOUTH ONCE DAILY 90 tablet 3  . clopidogrel (PLAVIX) 75 MG tablet TAKE ONE TABLET BY MOUTH ONCE DAILY 30 tablet 6  . isosorbide dinitrate (ISORDIL) 10 MG tablet Take 2 tablets (20 mg total) by mouth as directed. TAKE 2 TABS IN THE MORNING AND 1 TAB IN THE PM 270  tablet 3  . lisinopril (PRINIVIL,ZESTRIL) 20 MG tablet TAKE ONE TABLET BY MOUTH ONCE DAILY 90 tablet 3  . metoprolol tartrate (LOPRESSOR) 25 MG tablet Take 0.5 tablets (12.5 mg total) by mouth at bedtime.    . Multiple Vitamins-Minerals (PRESERVISION AREDS PO) Take 1 tablet by mouth 2 (two) times daily.    . nitroGLYCERIN (NITROSTAT) 0.4 MG SL tablet Place 0.4 mg under the tongue every 5 (five) minutes as needed for chest pain.    . pantoprazole (PROTONIX) 40 MG tablet Take 1 tablet (40 mg total)  by mouth daily. 90 tablet 3  . tamsulosin (FLOMAX) 0.4 MG CAPS capsule Take 1 capsule (0.4 mg total) by mouth daily. 30 capsule 3   No current facility-administered medications for this visit.    No Known Allergies    Review of Systems:   General:  normal appetite, decreased energy, no weight gain, no weight loss, no fever  Cardiac:  has chest pain with exertion, no chest pain at rest, moderate SOB with mild exertion, no resting SOB, no PND, no orthopnea, no palpitations, no arrhythmia, no atrial fibrillation, mild LE edema, no dizzy spells, no syncope  Respiratory:  exertional shortness of breath, no home oxygen, no productive cough, has dry cough, no bronchitis, no wheezing, no hemoptysis, no asthma, no pain with inspiration or cough, no sleep apnea, no CPAP at night  GI:   no difficulty swallowing, no reflux, no frequent heartburn, no hiatal hernia, no abdominal pain, no constipation,has diarrhea, no hematochezia, no hematemesis, no melena  GU:   no dysuria,  no frequency, no urinary tract infection, no hematuria, no enlarged prostate, no kidney stones, no kidney disease  Vascular:  had pain suggestive of claudication prior to iliac stents, no pain in feet, no leg cramps, no varicose veins, no DVT, no non-healing foot ulcer  Neuro:   no stroke, no TIA's, no seizures, no headaches, no temporary blindness one eye,  no slurred speech, no peripheral neuropathy, no chronic pain, no instability of gait,  no memory/cognitive dysfunction  Musculoskeletal: no arthritis, no joint swelling, no myalgias, no difficulty walking, normal mobility   Skin:   no rash, no itching, no skin infections, no pressure sores or ulcerations  Psych:   no anxiety, no depression, no nervousness, no unusual recent stress  Eyes:   no blurry vision, no floaters, no recent vision changes,  wears glasses or contacts  ENT:   has hearing loss, has loose or painful teeth, upper  dentures, last saw dentist many years ago  Hematologic:  no easy bruising, no abnormal bleeding, no clotting disorder, no frequent epistaxis  Endocrine:  no diabetes, does not check CBG's at home     Physical Exam:   BP 130/62 mmHg  Pulse 67  Resp 19  Ht 5\' 9"  (1.753 m)  Wt 192 lb (87.091 kg)  BMI 28.34 kg/m2  SpO2 95%  General:  Elderly but  well-appearing  HEENT:  Unremarkable, NCAT, PERLA, EOMI, 6 teeth remain in the mandible and a couple are loose  Neck:   no JVD, no bruits, no adenopathy or thyromegaly  Chest:   clear to auscultation, symmetrical breath sounds, no wheezes, no rhonchi   CV:   RRR, grade III/VI crescendo/decrescendo murmur heard best at RSB,  no diastolic murmur  Abdomen:  soft, non-tender, no masses or organomegaly  Extremities:  warm, well-perfused, pulses palpable in feet, no LE edema  Rectal/GU  Deferred  Neuro:   Grossly non-focal and symmetrical throughout  Skin:   Clean and dry, no rashes, no breakdown   Diagnostic Tests:   *[facility] [place]*  [address]  [place], [place] [address]  [phone]  ------------------------------------------------------------------- Transthoracic Echocardiography  Patient: Yosiah, Hairr MR #: [id number] Study Date: 04/06/2016 Gender: M Age: 58 Height: 175.3 cm Weight: 84.4 kg BSA: 2.04 m^2 Pt. Status: Room:  ATTENDING [place], [place], [place] SONOGRAPHER Pilar Jarvis, RVT, RDCS, RDMS  cc:  ------------------------------------------------------------------- LV EF: 55% - 60%  -------------------------------------------------------------------  History: PMH: OSA. Coronary artery disease. Angina pectoris. Aortic valve disease. Risk factors: Hypertension. Dyslipidemia.  ------------------------------------------------------------------- Study Conclusions  - Left ventricle: The cavity size was normal. There was mild  concentric hypertrophy. Systolic function was normal. The  estimated ejection fraction was in the range of 55% to 60%. Wall  motion was normal; there were no regional wall motion  abnormalities. Doppler parameters are consistent with abnormal  left ventricular relaxation (grade 1 diastolic dysfunction). - Aortic valve: There was moderate to severe stenosis. There was  mild regurgitation. Mean gradient (S): 28 mm Hg. Peak gradient  (S): 46 mm Hg. Valve area (VTI): 0.94 cm^2. - Left atrium: The atrium was mildly dilated. - Pulmonary arteries: Systolic pressure was within the normal  range.  Impressions:  - Relatively stable aortic stenosis.  Transthoracic echocardiography. M-mode, complete 2D, spectral Doppler, and color Doppler. Birthdate: Patient birthdate: Aug 15, 1936. Age: Patient is 80 yr old. Sex: Gender: male. BMI: 27.5 kg/m^2. Blood pressure: 136/62 Patient status: Outpatient. Study date: Study date: 04/06/2016. Study time: 03:41 PM.  -------------------------------------------------------------------  ------------------------------------------------------------------- Left ventricle: The cavity size was normal. There was mild concentric hypertrophy. Systolic function was normal. The estimated ejection fraction was in the range of 55% to 60%. Wall motion  was normal; there were no regional wall motion abnormalities. Doppler parameters are consistent with abnormal left ventricular relaxation (grade 1 diastolic dysfunction).  ------------------------------------------------------------------- Aortic valve: Trileaflet; mildly thickened, moderately calcified leaflets. Mobility was not restricted. Doppler: There was moderate to severe stenosis. There was mild regurgitation. VTI ratio of LVOT to aortic valve: 0.25. Valve area (VTI): 0.94 cm^2. Indexed valve area (VTI): 0.46 cm^2/m^2. Peak velocity ratio of LVOT to aortic valve: 0.26. Valve area (Vmax): 0.89 cm^2. Indexed valve area (Vmax): 0.44 cm^2/m^2. Mean velocity ratio of LVOT to aortic valve: 0.24. Valve area (Vmean): 0.84 cm^2. Indexed valve area (Vmean): 0.41 cm^2/m^2. Mean gradient (S): 28 mm Hg. Peak gradient (S): 46 mm Hg.  ------------------------------------------------------------------- Aorta: Aortic root: The aortic root was normal in size.  ------------------------------------------------------------------- Mitral valve: Structurally normal valve. Mobility was not restricted. Doppler: Transvalvular velocity was within the normal range. There was no evidence for stenosis. There was no regurgitation. Peak gradient (D): 2 mm Hg.  ------------------------------------------------------------------- Left atrium: The atrium was mildly dilated.  ------------------------------------------------------------------- Right ventricle: The cavity size was normal. Wall thickness was normal. Systolic function was normal.  ------------------------------------------------------------------- Pulmonic valve: Doppler: Transvalvular velocity was within the normal range. There was no evidence for stenosis. There was trivial regurgitation.  ------------------------------------------------------------------- Tricuspid valve: Structurally normal valve.  Doppler: Transvalvular velocity was within the normal range. There was trivial regurgitation.  ------------------------------------------------------------------- Pulmonary artery: The main pulmonary artery was normal-sized. Systolic pressure was within the normal range.  ------------------------------------------------------------------- Right atrium: The atrium was normal in size.  ------------------------------------------------------------------- Pericardium: There was no pericardial effusion.  ------------------------------------------------------------------- Systemic veins: Inferior vena cava: The vessel was normal in size.  ------------------------------------------------------------------- Measurements  Left ventricle Value Reference LV ID, ED, PLAX chordal 47.6 mm 43 - 52 LV ID, ES, PLAX chordal 35.4 mm 23 - 38 LV fx shortening, PLAX chordal (L) 26 % >=29 LV PW thickness, ED 12.3 mm --------- IVS/LV PW ratio, ED 1.14 <=1.3 Stroke volume, 2D 72 ml --------- Stroke volume/bsa, 2D 35 ml/m^2 --------- LV e&', lateral 8.05 cm/s --------- LV E/e&', lateral 9.04 --------- LV e&', medial 5.66 cm/s --------- LV E/e&', medial 12.86 --------- LV e&', average 6.86 cm/s --------- LV E/e&', average 10.62 ---------  Ventricular septum Value Reference IVS thickness, ED 14 mm ---------  LVOT Value Reference LVOT ID,  S 21 mm --------- LVOT area 3.46 cm^2 --------- LVOT ID 21 mm --------- LVOT peak velocity, S 87.2 cm/s --------- LVOT mean velocity, S 53.9 cm/s --------- LVOT VTI, S 20.8 cm --------- Stroke volume (SV), LVOT DP 72 ml --------- Stroke index (SV/bsa), LVOT DP 35.2 ml/m^2 ---------  Aortic valve Value Reference Aortic valve peak velocity, S 339 cm/s --------- Aortic valve mean velocity, S 221 cm/s --------- Aortic valve VTI, S 84.5 cm --------- Aortic mean gradient, S 28 mm Hg --------- Aortic peak gradient, S 46 mm Hg --------- VTI ratio, LVOT/AV 0.25 --------- Aortic valve area, VTI 0.94 cm^2 --------- Aortic valve area/bsa, VTI 0.46 cm^2/m^2 --------- Velocity ratio, peak, LVOT/AV 0.26 --------- Aortic valve area, peak velocity 0.89 cm^2 --------- Aortic valve area/bsa, peak 0.44 cm^2/m^2 --------- velocity Velocity ratio, mean, LVOT/AV 0.24 --------- Aortic valve area, mean velocity 0.84 cm^2 --------- Aortic valve area/bsa, mean 0.41 cm^2/m^2 --------- velocity  Aorta Value Reference Aortic root ID, ED 28 mm ---------  Left atrium Value Reference LA ID, A-P, ES 46 mm --------- LA ID/bsa, A-P (H) 2.25 cm/m^2  <=2.2 LA volume, S 74.8 ml --------- LA volume/bsa, S 36.6 ml/m^2 --------- LA volume, ES, 1-p A4C 59.5 ml --------- LA volume/bsa, ES, 1-p A4C 29.1 ml/m^2 --------- LA volume, ES, 1-p A2C 86.7 ml --------- LA volume/bsa, ES, 1-p A2C 42.4 ml/m^2 ---------  Mitral valve Value Reference Mitral E-wave peak velocity 72.8 cm/s --------- Mitral A-wave peak velocity 99.9 cm/s --------- Mitral deceleration time 155 ms 150 - 230 Mitral peak gradient, D 2 mm Hg --------- Mitral E/A ratio, peak 0.7 ---------  Right ventricle Value Reference TAPSE 16.8 mm ---------  Pulmonic valve Value Reference Pulmonic valve peak velocity, S 73.2 cm/s --------- Pulmonic regurg velocity, ED 96.4 cm/s ---------  Legend: (L) and (H) mark values outside specified reference range.  ------------------------------------------------------------------- Prepared and Electronically Authenticated by  [name], MD 2017-04-26T08:24:51  ADDENDUM REPORT: 05/07/2016 12:41 CLINICAL DATA: Aortic stenosis EXAM: Cardiac TAVR CT TECHNIQUE: The patient was scanned on a Philips 256 scanner. A 120 kV retrospective scan was triggered in the descending thoracic aorta at 111 HU's. Gantry rotation speed was 270 msecs and collimation was .9 mm. No beta blockade or nitro were given. The 3D data set was reconstructed in 5% intervals of the R-R cycle. Systolic and diastolic phases were analyzed on a dedicated work station using MPR, MIP and VRT modes.  The patient received 80 cc of contrast. FINDINGS: Aortic Valve: Tri-leaflet and moderately calcified. Aorta: Moderate calcification of arch with normal origin of great vessels Previous CABG with SVG markers and LIMA Sinotubular Junction: 28 mm Ascending Thoracic Aorta: 33 mm Aortic Arch: 28 mm Descending Thoracic Aorta: 27 mm Sinus of Valsalva Measurements: Non-coronary: 33 mm Right -coronary: 34 mm Left -coronary: 37 mm Coronary Artery Height above Annulus: Left Main: 15 mm Right Coronary: 21 mm Virtual Basal Annulus Measurements: Maximum/Minimum Diameter: 22.7 mm x 29.4 mm Perimeter: 84 mm Area: 532 mm2 Coronary Arteries: Sufficient height above annulus for deployment Optimum Fluoroscopic Angle for Delivery: AP IMPRESSION: 1) Calcified trileaflet aortic valve with annulus 532 mm2 suitable for a 29 mm Sapien 3 valve 2) Sufficient coronary height for deployment 3) Optimum angiographic angle for deployment straight AP Jenkins Rouge Electronically Signed  By: Jenkins Rouge M.D.  On: 05/07/2016 12:41     Study Result     EXAM: OVER-READ INTERPRETATION CT CHEST  The following report is an over-read performed by radiologist Dr. Rebekah Chesterfield  [facility], [place] on 05/07/2016. This over-read does not include interpretation of cardiac or coronary anatomy or pathology. The coronary calcium score/coronary CTA interpretation by the cardiologist is attached.  COMPARISON: No priors.  FINDINGS: Extracardiac findings will be described separately on dictation for contemporaneously obtained CTA of the chest, abdomen and pelvis.  IMPRESSION: Please see separate dictation for contemporaneously obtained CTA of the chest, abdomen and pelvis 05/07/2016 for full description of extracardiac findings.  Electronically Signed: By: Vinnie Langton M.D. On: 05/07/2016 10:57   CLINICAL DATA: 80 year old male with history of severe aortic stenosis.  Preprocedural study prior to potential transcatheter aortic valve replacement (TAVR) procedure.  EXAM: CT ANGIOGRAPHY CHEST, ABDOMEN AND PELVIS  TECHNIQUE: Multidetector CT imaging through the chest, abdomen and pelvis was performed using the standard protocol during bolus administration of intravenous contrast. Multiplanar reconstructed images and MIPs were obtained and reviewed to evaluate the vascular anatomy.  CONTRAST: 75 mL of Isovue 370  COMPARISON: CT the abdomen and pelvis 06/28/2013.  FINDINGS: CTA CHEST FINDINGS  Mediastinum/Lymph Nodes: Heart size is mildly enlarged. There is no significant pericardial fluid, thickening or pericardial calcification. There is atherosclerosis of the thoracic aorta, the great vessels of the mediastinum and the coronary arteries, including calcified atherosclerotic plaque in the left main, left anterior descending, left circumflex and right coronary arteries. Status post median sternotomy for CABG, including LIMA to the LAD. Severe thickening and calcifications of the aortic valve. Some borderline enlarged lymph nodes are noted in the hilar regions bilaterally, nonspecific and favored to be reactive. No pathologically enlarged mediastinal lymph nodes. Esophagus is normal in appearance. No axillary lymphadenopathy.  Lungs/Pleura: Lung volumes appear low and there are some dependent areas of ground-glass attenuation in the lower lobes of the lungs bilaterally, favored to reflect areas of developing subsegmental atelectasis. No acute consolidative airspace disease. No pleural effusions. No suspicious appearing pulmonary nodules or masses.  Musculoskeletal/Soft Tissues: Median sternotomy wires. There are no aggressive appearing lytic or blastic lesions noted in the visualized portions of the skeleton.  CTA ABDOMEN AND PELVIS FINDINGS  Hepatobiliary: No cystic or solid hepatic lesions. No intra or extrahepatic biliary  ductal dilatation. Status post cholecystectomy.  Pancreas: No pancreatic mass. No pancreatic ductal dilatation. No pancreatic or peripancreatic fluid or inflammatory changes.  Spleen: Unremarkable.  Adrenals/Urinary Tract: Mild diffuse cortical atrophy in both kidneys. Multiple sub cm low-attenuation lesions in the kidneys bilaterally, too small to characterize, but statistically likely cysts. Exophytic 2 cm simple cyst in the lateral aspect of the interpolar region of the left kidney. No hydroureteronephrosis. Urinary bladder is unremarkable in appearance. Bilateral adrenal glands are normal in appearance.  Stomach/Bowel: Normal appearance of the stomach. No pathologic dilatation of small bowel or colon. Several colonic diverticulae are noted, without surrounding inflammatory changes to suggest an acute diverticulitis at this time. Normal appendix.  Vascular/Lymphatic: Extensive atherosclerosis throughout the abdominal and pelvic vasculature, with vascular findings and measurements pertinent to potential TAVR procedure, as detailed below. In addition, there is mild aneurysmal dilatation of the right common iliac artery which measures up to 1.8 cm in diameter. No lymphadenopathy noted in the abdomen or pelvis.  Reproductive: Prostate gland and seminal vesicles are unremarkable in appearance.  Other: No significant volume of ascites. No pneumoperitoneum.  Musculoskeletal: There are no aggressive appearing lytic or blastic lesions noted in the visualized portions of the skeleton.  VASCULAR MEASUREMENTS PERTINENT TO TAVR:  AORTA:  Minimal Aortic Diameter - 12 x 13 mm  Severity of Aortic Calcification - severe  RIGHT PELVIS:  Right Common Iliac Artery -  Minimal Diameter - 6.4 x 6.0 mm  Tortuosity - mild  Calcification - severe  Comment - There is a right common iliac artery stent in place, with the narrowest luminal diameter of 7.8 x 6.9  mm.  Right External Iliac Artery -  Minimal Diameter - 8.2 x 5.2 mm  Tortuosity - moderate  Calcification - mild  Right Common Femoral Artery -  Minimal Diameter - 6.2 x 8.2 mm  Tortuosity - mild  Calcification - mild-to-moderate  LEFT PELVIS:  Left Common Iliac Artery -  Minimal Diameter - 4.7 x 4.3 mm  Tortuosity - mild  Calcification - severe  Comment - There is a left common iliac artery stent in place, with the narrowest luminal diameter of 4.7 x 4.3 mm.  Left External Iliac Artery -  Minimal Diameter - 6.5 x 7.5 mm  Tortuosity - moderate  Calcification - mild  Left Common Femoral Artery -  Minimal Diameter - 8.1 x 6.4 mm  Tortuosity - mild  Calcification - mild-to-moderate  Review of the MIP images confirms the above findings.  IMPRESSION: 1. Vascular findings and measurements pertinent to potential TAVR procedure, as detailed above. This patient appears to have suitable pelvic arterial access on the right side. However, bilateral common iliac artery stents are present, as discussed above. 2. Thickening calcification of the aortic valve, compatible with the reported clinical history of severe aortic stenosis. 3. Atherosclerosis, including left main and 3 vessel coronary artery disease. Status post median sternotomy for CABG, including LIMA to the LAD. 4. Colonic diverticulosis without evidence of acute diverticulitis at this time. 5. Additional incidental findings, as above.   Electronically Signed  By: Vinnie Langton M.D.  On: 05/07/2016 13:05  Procedure: AV Replacement  Risk of Mortality: 3.892%  Morbidity or Mortality: 22.934%  Long Length of Stay: 7.87%  Short Length of Stay: 27.869%  Permanent Stroke: 2.572%  Prolonged Ventilation: 14.05%  DSW Infection: 0.428%  Renal Failure: 5.055%  Reoperation: 9.025%   Impression:  This patient is a 80 year old with stage D severe, symptomatic  aortic stenosis with shortness of breath, fatigue and chest pain with mild exertion ( NYHA class II-III). I have personally reviewed his prior cardiac cath, recent echo and CTA studies. His mean gradient is only 28 mm Hg by echo but the valve is thickened and moderately calcified with restricted mobility. His DI is 0.26 and the valve area is 0.89 cm2. I suspect that he has low gradient, normal EF severe AS. His symptoms are consistent with severe aortic stenosis but I think a repeat cath is needed to rule out significant coronary disease given his PCI of the OM2 last August and his ongoing chest pain with exertion. I think AVR is indicated in this patient with progressive symptoms. I think TAVR is the best option for this patient due to the high risk of SAVR since he has had two prior CABG surgeries.   The patient and his wife were counseled at length regarding treatment alternatives for management of severe symptomatic aortic stenosis. Alternative approaches such as conventional aortic valve replacement, transcatheter aortic valve replacement, and palliative medical therapy were compared and contrasted at length. The risks associated with conventional surgical aortic valve replacement were been discussed in detail, as were expectations for post-operative convalescence. Long-term prognosis with medical therapy was discussed.  His gated cardiac CT shows anatomy suitable for a Sapien 3 valve. The abdominal and pelvic CT shows adequate  pelvic vessels for a right transfemoral approach. He has had bilateral iliac artery stenting and has some narrowing on the left but the right iliac dimensions are large enough to allow a 64F sheath to pass.   We discussed complications that might develop including but not limited to risks of death, stroke, paravalvular leak, aortic dissection or other major vascular complications, aortic annulus rupture, device embolization, cardiac rupture or perforation, mitral regurgitation,  acute myocardial infarction, arrhythmia, heart block or bradycardia requiring permanent pacemaker placement, congestive heart failure, respiratory failure, renal failure, pneumonia, infection, other late complications related to structural valve deterioration or migration, or other complications that might ultimately cause a temporary or permanent loss of functional independence or other long term morbidity. The patient provides full informed consent for the procedure as described and all questions were answered.   He does have some loose teeth and has not seen a dentist in years so we will try to get him in to see Dr Lawana Chambers this week since he is scheduled for surgery next Tuesday.     Plan:  1. Dental evaluation  2. Transfemoral TAVR tentatively scheduled for next Tuesday.   3.  He will need to stop Plavix 5 days preop.    Gaye Pollack, MD 05/24/2016

## 2016-05-25 ENCOUNTER — Ambulatory Visit (HOSPITAL_COMMUNITY): Payer: Self-pay | Admitting: Dentistry

## 2016-05-25 ENCOUNTER — Other Ambulatory Visit: Payer: Self-pay | Admitting: *Deleted

## 2016-05-25 ENCOUNTER — Encounter: Payer: Self-pay | Admitting: Physical Therapy

## 2016-05-25 ENCOUNTER — Encounter (HOSPITAL_COMMUNITY): Payer: Self-pay | Admitting: Dentistry

## 2016-05-25 ENCOUNTER — Ambulatory Visit (HOSPITAL_COMMUNITY)
Admission: RE | Admit: 2016-05-25 | Discharge: 2016-05-25 | Disposition: A | Discharge status: Home / Self Care | Payer: Medicare Other | Source: Ambulatory Visit | Attending: Cardiovascular Disease | Admitting: Cardiovascular Disease

## 2016-05-25 ENCOUNTER — Ambulatory Visit: Payer: Medicare Other | Attending: Cardiovascular Disease | Admitting: Physical Therapy

## 2016-05-25 VITALS — BP 157/62 | HR 56 | Temp 97.5°F

## 2016-05-25 DIAGNOSIS — R293 Abnormal posture: Secondary | ICD-10-CM | POA: Diagnosis present

## 2016-05-25 DIAGNOSIS — Z972 Presence of dental prosthetic device (complete) (partial): Secondary | ICD-10-CM

## 2016-05-25 DIAGNOSIS — M264 Malocclusion, unspecified: Secondary | ICD-10-CM

## 2016-05-25 DIAGNOSIS — Z9189 Other specified personal risk factors, not elsewhere classified: Secondary | ICD-10-CM

## 2016-05-25 DIAGNOSIS — Z01818 Encounter for other preprocedural examination: Secondary | ICD-10-CM | POA: Diagnosis not present

## 2016-05-25 DIAGNOSIS — K031 Abrasion of teeth: Secondary | ICD-10-CM

## 2016-05-25 DIAGNOSIS — K0820 Unspecified atrophy of edentulous alveolar ridge: Secondary | ICD-10-CM

## 2016-05-25 DIAGNOSIS — IMO0002 Reserved for concepts with insufficient information to code with codable children: Secondary | ICD-10-CM

## 2016-05-25 DIAGNOSIS — R2689 Other abnormalities of gait and mobility: Secondary | ICD-10-CM | POA: Insufficient documentation

## 2016-05-25 DIAGNOSIS — I350 Nonrheumatic aortic (valve) stenosis: Secondary | ICD-10-CM | POA: Insufficient documentation

## 2016-05-25 DIAGNOSIS — K045 Chronic apical periodontitis: Secondary | ICD-10-CM

## 2016-05-25 DIAGNOSIS — K08409 Partial loss of teeth, unspecified cause, unspecified class: Secondary | ICD-10-CM

## 2016-05-25 DIAGNOSIS — K0889 Other specified disorders of teeth and supporting structures: Secondary | ICD-10-CM

## 2016-05-25 DIAGNOSIS — K0530 Chronic periodontitis, unspecified: Secondary | ICD-10-CM

## 2016-05-25 DIAGNOSIS — K036 Deposits [accretions] on teeth: Secondary | ICD-10-CM

## 2016-05-25 LAB — PULMONARY FUNCTION TEST
DL/VA % pred: 78 %
DL/VA: 3.55 ml/min/mmHg/L
DLCO UNC % PRED: 71 %
DLCO unc: 22.15 ml/min/mmHg
FEF 25-75 PRE: 2.4 L/s
FEF 25-75 Post: 3.67 L/sec
FEF2575-%CHANGE-POST: 52 %
FEF2575-%PRED-POST: 183 %
FEF2575-%PRED-PRE: 119 %
FEV1-%Change-Post: 10 %
FEV1-%Pred-Post: 104 %
FEV1-%Pred-Pre: 94 %
FEV1-PRE: 2.68 L
FEV1-Post: 2.96 L
FEV1FVC-%CHANGE-POST: 3 %
FEV1FVC-%Pred-Pre: 106 %
FEV6-%CHANGE-POST: 6 %
FEV6-%PRED-PRE: 92 %
FEV6-%Pred-Post: 98 %
FEV6-PRE: 3.43 L
FEV6-Post: 3.66 L
FEV6FVC-%Change-Post: 0 %
FEV6FVC-%PRED-PRE: 105 %
FEV6FVC-%Pred-Post: 105 %
FVC-%CHANGE-POST: 7 %
FVC-%PRED-POST: 94 %
FVC-%PRED-PRE: 88 %
FVC-POST: 3.74 L
FVC-PRE: 3.49 L
POST FEV1/FVC RATIO: 79 %
POST FEV6/FVC RATIO: 98 %
Pre FEV1/FVC ratio: 77 %
Pre FEV6/FVC Ratio: 98 %
RV % PRED: 107 %
RV: 2.74 L
TLC % pred: 96 %
TLC: 6.62 L

## 2016-05-25 MED ORDER — ALBUTEROL SULFATE (2.5 MG/3ML) 0.083% IN NEBU
2.5000 mg | INHALATION_SOLUTION | Freq: Once | RESPIRATORY_TRACT | Status: AC
  Administered 2016-05-25: 2.5 mg via RESPIRATORY_TRACT

## 2016-05-25 NOTE — Patient Instructions (Signed)
Department of Dental Medicine     DR. KULINSKI      HEART VALVES AND MOUTH CARE:  FACTS:   If you have any infection in your mouth, it can infect your heart valve.  If you heart valve is infected, you will be seriously ill.  Infections in the mouth can be SILENT and do not always cause pain.  Examples of infections in the mouth are gum disease, dental cavities, and abscesses.  Some possible signs of infection are: Bad breath, bleeding gums, or teeth that are sensitive to sweets, hot, and/or cold. There are many other signs as well.  WHAT YOU HAVE TO DO:   Brush your teeth after meals and at bedtime. Spend at least 2 minutes brushing well, especially behind your back teeth and all around your teeth that stand alone. Brush at the gumline also.  Do not go to bed without brushing your teeth and flossing.  If you gums bleed when you brush or floss, do NOT stop brushing or flossing. It usually means that your gums need more attention and better cleaning.   If your Dentist or Dr. Kulinski gave you a prescription mouthwash to use, make sure to use it as directed. If you run out of the medication, get a refill at the pharmacy.   If you were given any other medications or directions by your Dentist, please follow them. If you did not understand the directions or forget what you were told, please call. We will be happy to refresh her memory.  If you need antibiotics before dental procedures, make sure you take them one hour prior to every dental visit as directed.   Get a dental checkup every 4-6 months in order to keep your mouth healthy, or to find and treat any new infection. You will most likely need your teeth cleaned or gums treated at the same time.  If you are not able to come in for your scheduled appointment, call your Dentist as soon as possible to reschedule.  If you have a problem in between dental visits, call your Dentist.  

## 2016-05-25 NOTE — Progress Notes (Signed)
DENTAL CONSULTATION  Date of Consultation:  05/25/2016 Patient Name:   Matthew [name] Date of Birth:   August 06, 1936 Medical Record Number: [id number]  VITALS: BP 157/62 mmHg  Pulse 56  Temp(Src) 97.5 F (36.4 C) (Oral)  CHIEF COMPLAINT: Patient referred by Dr. Cyndia Bent for a dental consultation.  HPI: Matthew [name] is a 80 year old male recently diagnosed with severe aortic stenosis. Patient with anticipated aortic valve replacement procedure. Patient is now seen as part of a pre-heart valve surgery dental protocol examination to rule out dental infection that may affect the patient's systemic health and the anticipated heart valve surgery.  The patient currently denies acute toothaches, swellings, or abscesses. Patient is aware, however, that he has several loose teeth. Patient points to tooth numbers 22 and 23 as being loose. Patient last saw a dentist in 2004 for dental extraction procedure with Dr. Wynelle Cleveland in [place], [place]. Patient denies having any complications from that dental extraction procedure.  Patient has an upper complete and lower acrylic partial denture that fit "alright".  These were made in the 1990s by [facility] on [address]. 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   PROBLEM LIST: Patient Active Problem List   Diagnosis Date Noted  . Severe aortic stenosis 05/25/2016  . Thrombocytopenia ([place])   . Coronary artery disease 08/01/2015  . Effort angina ([place]) 07/31/2015  . PAD (peripheral artery disease) ([place]) 03/22/2014  . Angina pectoris ([place]) 02/14/2014  . GERD (gastroesophageal reflux disease) 08/17/2013  . Aortic valve disorder 05/01/2009  . OBSTRUCTIVE SLEEP APNEA 03/05/2008  . CENTRAL SLEEP APNEA CONDS CLASSIFIED ELSEWHERE 03/05/2008  . HLD (hyperlipidemia) 03/04/2008  . Essential hypertension 03/04/2008  . Coronary atherosclerosis 03/04/2008    PMH: Past Medical History  Diagnosis Date  . Hyperlipidemia   . HTN  (hypertension)   . Nephrolithiasis   . AS (aortic stenosis)     a. Echo 6/10: EF 55% mild AS; b. echo 06/2015; EF 55-60%, GR1DD, moderate AS, Peak velocity (S): 346 cm/s. Mean gradientS): 28 mm Hg. Peak gradient (S): 48 mm Hg. Valve area (VTI): 1.18 cm2;   c. Echo 4/17 - mild LVH, EF 55-60%, no RWMA, Gr 1 DD, mod to severe AS (mean 28 mmHg, peak 46 mmHg), mild LAE  . Carotid bruit     2009 0-39% on dopplers bilatrally  . OSA (obstructive sleep apnea)   . Myocardial infarction ([place]) 1996  . Shortness of breath   . GERD (gastroesophageal reflux disease)   . HOH (hard of hearing)   . History of kidney stones   . CAD (coronary artery disease)     a. MI 1996 w/ CABG 1996; b.redo CABG 06/2003; c. Myoview 7/09: EF 54% inferobasal infarct, no ischemia. Myoview 6/10 EF 43% inf wall infarct. no ischemia; c. cath 8/16 s/p DES to VG-OM2. OTW 3VD w/ patent VG->RPDA, VG->OM3,  & LIMA->LAD.  EF 55-65%.  Marland Kitchen PAD (peripheral artery disease) ([place]) 02/2014    Subtotal occlusion of right common iliac artery and 70% stenosis in the left common iliac artery. Status post bilateral kissing stent placement. 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 Thrombocytopenia ([facility])     PSH: Past Surgical History  Procedure Laterality Date  . Stomach surgery      removal of gastric ulcers  . Coronary artery bypass  graft  1991    at [facility]. Redone 2004-3 vessels 1st time and 4 second time  . Inguinal hernia repair  11/10/2011    Procedure: HERNIA REPAIR INGUINAL ADULT;  Surgeon: Donato Heinz;  Location: [facility] ORS;  Service: General;  Laterality: Right;  . Left heart catheterization with coronary/graft angiogram N/A 03/06/2014    Procedure: LEFT HEART CATHETERIZATION WITH Beatrix Fetters;  Surgeon: Wellington Hampshire, MD;  Location[facility] CATH LAB;  Service:  Cardiovascular;  Laterality: N/A;  . Abdominal aortagram N/A 03/06/2014    Procedure: ABDOMINAL Maxcine Ham;  Surgeon: Wellington Hampshire, MD;  Location: [facility] CATH LAB;  Service: Cardiovascular;  Laterality: N/A;  . Cardiac catheterization  02/2014    Severe three-vessel coronary artery disease with patent grafts.   . Cardiac catheterization N/A 07/31/2015    Procedure: Right/Left Heart Cath and Coronary/Graft Angiography;  Surgeon: Wellington Hampshire, MD;  Location: [facility] CV LAB;  Service: Cardiovascular;  Laterality: N/A;  . Cardiac catheterization N/A 07/31/2015    Procedure: Coronary Stent Intervention;  Surgeon: Wellington Hampshire, MD;  Location: [facility] CV LAB;  Service: Cardiovascular;  Laterality: N/A;    ALLERGIES: No Known Allergies  MEDICATIONS: Current Outpatient Prescriptions  Medication Sig Dispense Refill  . acetaminophen (TYLENOL) 325 MG tablet Take 650 mg by mouth daily as needed for mild pain or headache.     Marland Kitchen aspirin EC 81 MG tablet Take 1 tablet (81 mg total) by mouth daily. 90 tablet 3  . atorvastatin (LIPITOR) 80 MG tablet TAKE ONE TABLET BY MOUTH ONCE DAILY 90 tablet 3  . clopidogrel (PLAVIX) 75 MG tablet TAKE ONE TABLET BY MOUTH ONCE DAILY 30 tablet 6  . isosorbide dinitrate (ISORDIL) 10 MG tablet Take 2 tablets (20 mg total) by mouth as directed. TAKE 2 TABS IN THE MORNING AND 1 TAB IN THE PM 270 tablet 3  . lisinopril (PRINIVIL,ZESTRIL) 20 MG tablet TAKE ONE TABLET BY MOUTH ONCE DAILY 90 tablet 3  . metoprolol tartrate (LOPRESSOR) 25 MG tablet Take 0.5 tablets (12.5 mg total) by mouth at bedtime.    . Multiple Vitamins-Minerals (PRESERVISION AREDS PO) Take 1 tablet by mouth 2 (two) times daily.    . nitroGLYCERIN (NITROSTAT) 0.4 MG SL tablet Place 0.4 mg under the tongue every 5 (five) minutes as needed for chest pain.    . pantoprazole (PROTONIX) 40 MG tablet Take 1 tablet (40 mg total) by mouth daily. 90 tablet 3  . tamsulosin (FLOMAX) 0.4 MG CAPS capsule Take  1 capsule (0.4 mg total) by mouth daily. 30 capsule 3   No current facility-administered medications for this visit.    LABS: Lab Results  Component Value Date   WBC 7.1 04/02/2016   HGB 15.2 04/02/2016   HCT 43.9 04/02/2016   MCV 89.0 04/02/2016   PLT 154 04/02/2016      Component Value Date/Time   NA 138 04/02/2016 1243   NA 141 03/01/2014 1326   K 3.7 04/02/2016 1243   K 3.4* 07/10/2014 1052   CL 102 04/02/2016 1243   CO2 27 04/02/2016 1243   GLUCOSE 102* 04/02/2016 1243   GLUCOSE 61* 03/01/2014 1326   BUN 16 04/02/2016 1243   BUN 15 03/01/2014 1326   CREATININE 0.99 04/02/2016 1243   CREATININE 0.91 08/01/2015 0424   CALCIUM 9.4 04/02/2016 1243   GFRNONAA >60 08/01/2015 0424   GFRAA >60 08/01/2015 0424   Lab Results  Component Value Date   INR 1.06 07/28/2015  INR 1.0 03/01/2014   INR 1.1 02/14/2014   No results found for: PTT  SOCIAL HISTORY: Social History   Social History  . Marital Status: Married    Spouse Name: N/A  . Number of Children: 2  . Years of Education: N/A   Occupational History  . Retired    Social History Main Topics  . Smoking status: Former Smoker -- 1.00 packs/day for 50 years    Types: Cigarettes    Quit date: 01/24/1990  . Smokeless tobacco: Never Used  . Alcohol Use: No  . Drug Use: No  . Sexual Activity: Not on file   Other Topics Concern  . Not on file   Social History Narrative   Married an lives with wife in [place]. Retired form [employer].      FAMILY HISTORY: Family History  Problem Relation Age of Onset  . Heart disease Father   . Heart disease Mother     REVIEW OF SYSTEMS: Reviewed the ROS from Dr. Vivi Martens note of 05/24/16 with the patient with changes in BOLD. General:normal appetite, decreased energy, no weight gain, no weight loss, no fever Cardiac:+ chest pain with exertion (last use nitroglycerin one week ago),  no chest pain at rest,  moderate SOB with mild exertion, no resting SOB, no PND, no orthopnea, no palpitations, no arrhythmia, no    atrial fibrillation, mild LE edema, no dizzy spells, no syncope,  Respiratory:exertional shortness of breath, no home oxygen, no productive cough, has dry cough, no bronchitis, no wheezing, no hemoptysis, no asthma, no pain with inspiration or cough, +sleep apnea, no CPAP  GI:no difficulty swallowing, no reflux, no frequent heartburn, no hiatal hernia, no abdominal pain, no constipation,has diarrhea, no hematochezia, no hematemesis, no melena GU:no dysuria, no frequency, no urinary tract infection, no hematuria, no enlarged prostate, no kidney stones, no kidney disease Vascular:had pain suggestive of claudication prior to iliac stents, no pain in feet, no leg cramps, no varicose veins, no DVT, no non-healing foot ulcer Neuro:no stroke, no TIA's, no seizures, no headaches, no temporary blindness one eye, no slurred speech, no peripheral neuropathy, no chronic pain, no instability of gait, no memory/cognitive dysfunction Musculoskeletal:no arthritis, no joint swelling, no myalgias, no difficulty walking, normal mobility  Skin:no rash, no itching, no skin infections, no pressure sores or ulcerations Psych:+ dental phobia, no anxiety, no depression, no nervousness, no unusual recent stress Eyes:no blurry vision, no floaters, no recent vision changes, wears glasses  ENT:has hearing loss, bilateral hearing aids, has loose teeth, uppercomplete denture and lower partial denture, last saw dentist in 2004 Hematologic:brusies easliyno easy bruising, no abnormal bleeding, no clotting disorder, no frequent epistaxis Endocrine:no  diabetes, does not check CBG's at home  DENTAL HISTORY: CHIEF COMPLAINT: Patient referred by Dr. Cyndia Bent and Dr. Servando Snare for a dental consultation.  HPI: Matthew [name] is a 80 year old male recently diagnosed with severe aortic stenosis. Patient with anticipated aortic valve replacement procedure. Patient is now seen as part of a pre-heart valve surgery dental protocol examination to rule out dental infection that may affect the patient's systemic health and the anticipated heart valve surgery.  The patient currently denies acute toothaches, swellings, or abscesses. Patient is aware, however, that he has several loose teeth. Patient points to tooth numbers 22 and 23 as being loose. Patient last saw a dentist in 2004 for dental extraction procedure with Dr. Wynelle Cleveland in [place], [place]. Patient denies having any complications from that dental extraction procedure.  Patient has an upper  complete and lower acrylic partial denture that fit "alright".  These were made in the 1990s by [facility] on [address].  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  DENTAL EXAMINATION: GENERAL: The patient is a well-developed, well-nourished male in no acute distress. HEAD AND NECK: There is no palpable submandibular lymphadenopathy. The patient denies acute TMJ symptoms. INTRAORAL EXAM: Patient has normal saliva. There is no evidence of oral abscess formation. There is atrophy of the edentulous alveolar ridges. Patient has an edentulous maxilla. DENTITION: Patient is missing tooth numbers 1-16,17-21,24,27,30-32.  PERIODONTAL: Patient has chronic, advanced periodontal disease with plaque and calculus accumulations, generalized gingival recession, generalized significant tooth mobility. There is moderate to severe bone loss noted. DENTAL CARIES/SUBOPTIMAL RESTORATIONS: Patient has extensive abfraction lesions. ENDODONTIC: The patient currently denies acute pulpitis symptoms  patient has incipient periapical periapical pathology associated with tooth numbers 22 and 23. CROWN AND BRIDGE: There are no crown or bridge restorations. PROSTHODONTIC: Patient has an upper complete denture that has less than ideal stability and retention. The patient also has a mandibualr acrylic partial denture that is ill fitting. OCCLUSION: Patient has a poor occlusal scheme secondary to multiple missing teeth and lack of replacement of missing teeth with clinically acceptable dental prostheses.  RADIOGRAPHIC INTERPRETATION: An orthopantogram was taken and supplemented with 4 lower periapical radiographs. There are multiple missing teeth. There is moderate to severe bone loss. There is supra-eruption and drifting of the unopposed teeth into the edentulous areas. There is atrophy of the edentulous alveolar ridges. There is incipient to periapical radiolucency associated with the apices of tooth numbers 22 and 23. Extensive radiolucent areas involving her teeth are consistent with multiple abfraction lesions. Bilateral hearing aids are noted.  ASSESSMENTS: 1. Severe aortic stenosis 2. Pre-aortic valve replacement dental protocol 3. Chronic periodontitis with bone loss 4. Gingival recession 5. Accretions 6. Loose teeth 7. Multiple abfraction lesions 8. Chronic apical periodontitis 9. Multiple missing teeth 10.  Atrophy of the edentulous alveolar ridges  11. Ill-fitting maxillary complete and lower acrylic partial denture 12. Poor occlusal scheme and malocclusion  13. Risk for bleeding with invasive dental procedures due to Plavix therapy 14. Questionable need for antibiotic premedication prior to invasive dental procedures   PLAN/RECOMMENDATIONS: 1. I discussed the risks, benefits, and complications of various treatment options with the patient in relationship to his medical and dental conditions, anticipated heart valve surgery, and risk for endocarditis. We discussed various treatment  options to include no treatment, extraction of remaining teeth with alveoloplasty, periodontal therapy, implant therapy, and replacement of missing teeth as indicated.  We also discussed referral to an oral surgeon for dental extraction procedures in my absence. The patient currently wishes to proceed with referral to Dr. Frederik Schmidt, oral surgeon, for evaluation for extraction of remaining teeth with alveoloplasty.  The patient has been scheduled with Dr. Benson Norway for today at 2:30 PM with surgery to follow either today or later this week. The patient will then follow-up with a dentist of his choice for fabrication of upper and lower complete dentures after adequate healing and once medically stable from the anticipated heart valve surgery. Patient will proceed with aortic valve replacement surgery with Drs. Cooper and Longoria as scheduled for [id number] pending complications of the dental extraction procedures.   2. Discussion of findings with medical team and coordination of future medical and dental care as needed.  I spent in excess of  120 minutes during the conduct of this consultation  and >50% of this time involved direct face-to-face encounter for counseling and/or coordination of the patient's care.    Lenn Cal, DDS

## 2016-05-25 NOTE — Therapy (Signed)
[place], [place], [address] Phone: [phone]   Fax:  [phone]  Physical Therapy Evaluation  Patient Details  Name: Matthew [name] MRN: [id number] Date of Birth: Jul 24, 1936 Referring Provider: Dr. Sherren Mocha  Encounter Date: 05/25/2016      PT End of Session - 05/25/16 1430    Visit Number 1   PT Start Time 1232   PT Stop Time 1324   PT Time Calculation (min) 52 min      Past Medical History  Diagnosis Date  . Hyperlipidemia   . HTN (hypertension)   . Nephrolithiasis   . AS (aortic stenosis)     a. Echo 6/10: EF 55% mild AS; b. echo 06/2015; EF 55-60%, GR1DD, moderate AS, Peak velocity (S): 346 cm/s. Mean gradientS): 28 mm Hg. Peak gradient (S): 48 mm Hg. Valve area (VTI): 1.18 cm2;   c. Echo 4/17 - mild LVH, EF 55-60%, no RWMA, Gr 1 DD, mod to severe AS (mean 28 mmHg, peak 46 mmHg), mild LAE  . Carotid bruit     2009 0-39% on dopplers bilatrally  . OSA (obstructive sleep apnea)   . Myocardial infarction ([place]) 1996  . Shortness of breath   . GERD (gastroesophageal reflux disease)   . HOH (hard of hearing)   . History of kidney stones   . CAD (coronary artery disease)     a. MI 1996 w/ CABG 1996; b.redo CABG 06/2003; c. Myoview 7/09: EF 54% inferobasal infarct, no ischemia. Myoview 6/10 EF 43% inf wall infarct. no ischemia; c. cath 8/16 s/p DES to VG-OM2. OTW 3VD w/ patent VG->RPDA, VG->OM3,  & LIMA->LAD.  EF 55-65%.  Marland Kitchen PAD (peripheral artery disease) ([place]) 02/2014    Subtotal occlusion of right common iliac artery and 70% stenosis in the left common iliac artery. Status post bilateral kissing stent placement. 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 Thrombocytopenia [facility])     Past Surgical History  Procedure Laterality Date   . Stomach surgery      removal of gastric ulcers  . Coronary artery bypass graft  1991    at [facility]. Redone 2004-3 vessels 1st time and 4 second time  . Inguinal hernia repair  11/10/2011    Procedure: HERNIA REPAIR INGUINAL ADULT;  Surgeon: Donato Heinz;  Location: [facility];  Service: General;  Laterality: Right;  . Left heart catheterization with coronary/graft angiogram N/A 03/06/2014    Procedure: LEFT HEART CATHETERIZATION WITH Beatrix Fetters;  Surgeon: Wellington Hampshire, MD;  Location: [facility] CATH LAB;  Service: Cardiovascular;  Laterality: N/A;  . Abdominal aortagram N/A 03/06/2014    Procedure: ABDOMINAL Maxcine Ham;  Surgeon: Wellington Hampshire, MD;  Location: [place] CATH LAB;  Service: Cardiovascular;  Laterality: N/A;  . Cardiac catheterization  02/2014    Severe three-vessel coronary artery disease with patent grafts.   . Cardiac catheterization N/A 07/31/2015    Procedure: Right/Left Heart Cath and Coronary/Graft Angiography;  Surgeon: Wellington Hampshire, MD;  Location: [place] CV LAB;  Service: Cardiovascular;  Laterality: N/A;  . Cardiac catheterization N/A 07/31/2015    Procedure: Coronary Stent Intervention;  Surgeon: Wellington Hampshire, MD;  Location: [place] CV LAB;  Service: Cardiovascular;  Laterality: N/A;    There were no vitals filed for this visit.  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 No/denies            [facility] PT Assessment - 05/25/16 0001    Assessment   Medical Diagnosis severe aortic stenosis   Referring Provider Dr. Sherren Mocha   Onset Date/Surgical Date 05/26/15  approximate, worsening   Precautions   Precautions  None   Restrictions   Weight Bearing Restrictions No   Balance Screen   Has the patient fallen in the past 6 months Yes   How many times? 4  denies injury, able to get himself up, all outdoors/unlevel   Has the patient had a decrease in activity level because of a fear of falling?  No   Is the patient reluctant to leave their home because of a fear of falling?  No   Home Environment   Living Environment Private residence   Living Arrangements Spouse/significant other  grandchildren   Home Access Level entry   Home Layout Two level;Full bath on main level;Able to live on main level with bedroom/bathroom;Laundry or work area in basement   Prior Function   Level of Independence Independent with household mobility without device;Independent with community mobility without device   Posture/Postural Control   Posture/Postural Control Postural limitations   Postural Limitations Forward head;Rounded Shoulders   ROM / Strength   AROM / PROM / Strength AROM;Strength   AROM   Overall AROM Comments grossly WNL   Strength   Overall Strength Comments grossly 5/5 throughout UE and lower extremities   Strength Assessment Site Hand   Right/Left hand Right;Left   Right Hand Grip (lbs) 81  R hand dominant   Left Hand Grip (lbs) 87   Ambulation/Gait   Gait Comments No signifcant deviations noted. Patient ambulates independently within clinic but does report 4 falls in the last 6 months, mainly outdoors when working in yard. Patient has been able to get himself up all but once and denies any injury. Falls generally occur when turning or on unexpectefly unlevel of soft surfaces. Pt scored at high fall risk per 4 stage balance test but not on the Timed up and go.    6 minute walk test results    Aerobic Endurance Distance Walked 1070   Endurance additional comments see clinical impression          [facility] Pre-Surgical Assessment - 05/25/16 0001    5 Meter Walk Test- trial 1 3.6 sec   5 Meter Walk  Test- trial 2 5 sec.    5 Meter Walk Test- trial 3 4.1 sec.  </= 6 sec WNL   5 meter walk test average 4.23 sec   Timed Up & Go Test trial  9.2 sec.   Comments </= 10 sec  WNL   4 Stage Balance Test tolerated for:  10 sec.   4 Stage Balance Test Position 2   comment inability to hold position 3x10 seconds indicates increased fall risk   Sit To Stand Test- trial 1 15.2 sec.   Comment <12.6 is WNL for age/gender   ADL/IADL Independent with: Bathing;Dressing;Meal prep;Finances   ADL/IADL Needs Assistance with: V[place] Leaver work  minimal   ADL/IADL Fraility Index Vulnerable   6  Minute Walk- Baseline yes   BP (mmHg) 140/58 mmHg   HR (bpm) 68   02 Sat (%RA) 97 %   Modified Borg Scale for Dyspnea 0- Nothing at all   Perceived Rate of Exertion (Borg) 6-   6 Minute Walk Post Test yes   BP (mmHg) 190/82 mmHg  recoved to 158/62 after 5 minutes rest   HR (bpm) 92   02 Sat (%RA) 96 %   Modified Borg Scale for Dyspnea 4- somewhat severe   Perceived Rate of Exertion (Borg) 13- Somewhat hard                         PT Education - 05/25/16 1429    Education provided Yes   Education Details fall risk, BP response to walking   Person(s) Educated Patient;Child(ren)   Methods Explanation   Comprehension Verbalized understanding                    Plan - 05/25/16 1436    Clinical Impression Statement Pt is a 80 yo male presenting to OP PT for evaluation prior to possible TAVR surgery due to severe aortic stenosis. Pt reports onset of exertional SOB approximately 9-12 months ago and has been gradually worsening. Symptoms are limiting patient primarily in his yardwork/farm - he is still able to ride his tractor but limited with walking tasks. Pt presents with good ROM and strength, good balance and is at high fall risk per 4 stage balance test, good walking speed and fair aerobic endurance per 6 minute walk test. Pt ambulated 555 feet in 2:45 before requesting a seated rest beak  lasting 45 seconds. At time of rest, patient's HR was 78 bpm and O2 was 96 on room air. Pt reported 1/10 shortness of breath on modified scale for dyspnea. Pt able to resume after rest and ambulate an additional 515 feet. Pt ambulated a total of 1070 feet in 6 minute walk. Patient's BP increased significantly with 6 minute walk test - from 140/58 to 190/82. It recovered to 158/62 after approximately 5 minutes rest.    PT Frequency One time visit   Consulted and Agree with Plan of Care Patient;Family member/caregiver      Patient demonstrated the following deficits and impairments:     Visit Diagnosis: Abnormal posture - Plan: PT plan of care cert/re-cert  Other abnormalities of gait and mobility - Plan: PT plan of care cert/re-cert      G-Codes - [id number]    Functional Assessment Tool Used 6 minute walk 1070' with 5 minute rest required after   Functional Limitation Mobility: Walking and moving around   Mobility: Walking and Moving Around Current Status [phone]) At least 20 percent but less than 40 percent impaired, limited or restricted   Mobility: Walking and Moving Around Goal Status [phone]) At least 20 percent but less than 40 percent impaired, limited or restricted   Mobility: Walking and Moving Around Discharge Status [phone]) At least 20 percent but less than 40 percent impaired, limited or restricted       Problem List Patient Active Problem List   Diagnosis Date Noted  . Severe aortic stenosis 05/25/2016  . Thrombocytopenia ([place])   . Coronary artery disease 08/01/2015  . Effort angina ([place]) 07/31/2015  . PAD (peripheral artery disease) ([place]) 03/22/2014  . Angina pectoris ([place]) 02/14/2014  . GERD (gastroesophageal reflux disease) 08/17/2013  . Aortic valve disorder 05/01/2009  . OBSTRUCTIVE SLEEP APNEA 03/05/2008  .  CENTRAL SLEEP APNEA CONDS CLASSIFIED ELSEWHERE 03/05/2008  . HLD (hyperlipidemia) 03/04/2008  . Essential hypertension 03/04/2008  . Coronary  atherosclerosis 03/04/2008    [place], PT 05/25/2016, 2:42 PM  [facility] [address] [place], [place], [address] Phone: [phone]   Fax:  [phone]  Name: Matthew [name] MRN: [id number] Date of Birth: 03/15/1936

## 2016-05-26 ENCOUNTER — Encounter: Payer: Medicare Other | Admitting: Surgery

## 2016-05-26 ENCOUNTER — Ambulatory Visit (HOSPITAL_COMMUNITY)
Admission: RE | Admit: 2016-05-26 | Discharge: 2016-05-26 | Disposition: A | Discharge status: Home / Self Care | Payer: Medicare Other | Source: Ambulatory Visit | Attending: Cardiovascular Disease | Admitting: Cardiovascular Disease

## 2016-05-26 ENCOUNTER — Encounter (HOSPITAL_COMMUNITY)
Admission: RE | Disposition: A | Discharge status: Home / Self Care | Payer: Self-pay | Source: Ambulatory Visit | Attending: Cardiovascular Disease

## 2016-05-26 ENCOUNTER — Other Ambulatory Visit: Payer: Self-pay | Admitting: Cardiovascular Disease

## 2016-05-26 DIAGNOSIS — Z7902 Long term (current) use of antithrombotics/antiplatelets: Secondary | ICD-10-CM | POA: Insufficient documentation

## 2016-05-26 DIAGNOSIS — Z8249 Family history of ischemic heart disease and other diseases of the circulatory system: Secondary | ICD-10-CM | POA: Insufficient documentation

## 2016-05-26 DIAGNOSIS — I739 Peripheral vascular disease, unspecified: Secondary | ICD-10-CM | POA: Insufficient documentation

## 2016-05-26 DIAGNOSIS — Z951 Presence of aortocoronary bypass graft: Secondary | ICD-10-CM | POA: Insufficient documentation

## 2016-05-26 DIAGNOSIS — I350 Nonrheumatic aortic (valve) stenosis: Secondary | ICD-10-CM | POA: Diagnosis not present

## 2016-05-26 DIAGNOSIS — K219 Gastro-esophageal reflux disease without esophagitis: Secondary | ICD-10-CM | POA: Diagnosis not present

## 2016-05-26 DIAGNOSIS — I252 Old myocardial infarction: Secondary | ICD-10-CM | POA: Insufficient documentation

## 2016-05-26 DIAGNOSIS — Z7982 Long term (current) use of aspirin: Secondary | ICD-10-CM | POA: Diagnosis not present

## 2016-05-26 DIAGNOSIS — G4733 Obstructive sleep apnea (adult) (pediatric): Secondary | ICD-10-CM | POA: Insufficient documentation

## 2016-05-26 DIAGNOSIS — I10 Essential (primary) hypertension: Secondary | ICD-10-CM | POA: Insufficient documentation

## 2016-05-26 DIAGNOSIS — Z87891 Personal history of nicotine dependence: Secondary | ICD-10-CM | POA: Diagnosis not present

## 2016-05-26 DIAGNOSIS — I2510 Atherosclerotic heart disease of native coronary artery without angina pectoris: Secondary | ICD-10-CM | POA: Diagnosis not present

## 2016-05-26 DIAGNOSIS — Z87442 Personal history of urinary calculi: Secondary | ICD-10-CM | POA: Diagnosis not present

## 2016-05-26 DIAGNOSIS — H9190 Unspecified hearing loss, unspecified ear: Secondary | ICD-10-CM | POA: Insufficient documentation

## 2016-05-26 DIAGNOSIS — I2582 Chronic total occlusion of coronary artery: Secondary | ICD-10-CM | POA: Insufficient documentation

## 2016-05-26 DIAGNOSIS — E785 Hyperlipidemia, unspecified: Secondary | ICD-10-CM | POA: Insufficient documentation

## 2016-05-26 HISTORY — PX: CARDIAC CATHETERIZATION: SHX172

## 2016-05-26 LAB — CBC
HEMATOCRIT: 40.7 % (ref 39.0–52.0)
HEMOGLOBIN: 13.3 g/dL (ref 13.0–17.0)
MCH: 29.3 pg (ref 26.0–34.0)
MCHC: 32.7 g/dL (ref 30.0–36.0)
MCV: 89.6 fL (ref 78.0–100.0)
Platelets: 120 10*3/uL — ABNORMAL LOW (ref 150–400)
RBC: 4.54 MIL/uL (ref 4.22–5.81)
RDW: 13.1 % (ref 11.5–15.5)
WBC: 5.7 10*3/uL (ref 4.0–10.5)

## 2016-05-26 LAB — BASIC METABOLIC PANEL
ANION GAP: 5 (ref 5–15)
BUN: 11 mg/dL (ref 6–20)
CHLORIDE: 111 mmol/L (ref 101–111)
CO2: 24 mmol/L (ref 22–32)
Calcium: 9.4 mg/dL (ref 8.9–10.3)
Creatinine, Ser: 0.9 mg/dL (ref 0.61–1.24)
GFR calc non Af Amer: >60 mL/min (ref >60)
GLUCOSE: 121 mg/dL — AB (ref 65–99)
Potassium: 3.6 mmol/L (ref 3.5–5.1)
Sodium: 140 mmol/L (ref 135–145)

## 2016-05-26 LAB — PROTIME-INR
INR: 1.08 (ref 0.00–1.49)
Prothrombin Time: 14.2 seconds (ref 11.6–15.2)

## 2016-05-26 LAB — POCT I-STAT 3, VENOUS BLOOD GAS (G3P V)
Acid-base deficit: 1 mmol/L (ref 0.0–2.0)
BICARBONATE: 24.4 meq/L — AB (ref 20.0–24.0)
O2 Saturation: 68 %
PH VEN: 7.369 — AB (ref 7.250–7.300)
PO2 VEN: 37 mmHg (ref 31.0–45.0)
TCO2: 26 mmol/L (ref 0–100)
pCO2, Ven: 42.3 mmHg — ABNORMAL LOW (ref 45.0–50.0)

## 2016-05-26 LAB — POCT I-STAT 3, ART BLOOD GAS (G3+)
Acid-base deficit: 2 mmol/L (ref 0.0–2.0)
Bicarbonate: 22 mEq/L (ref 20.0–24.0)
O2 SAT: 94 %
PCO2 ART: 35.4 mmHg (ref 35.0–45.0)
PO2 ART: 72 mmHg — AB (ref 80.0–100.0)
TCO2: 23 mmol/L (ref 0–100)
pH, Arterial: 7.402 (ref 7.350–7.450)

## 2016-05-26 SURGERY — RIGHT/LEFT HEART CATH AND CORONARY ANGIOGRAPHY

## 2016-05-26 MED ORDER — MIDAZOLAM HCL 2 MG/2ML IJ SOLN
INTRAMUSCULAR | Status: DC | PRN
  Administered 2016-05-26: 1 mg via INTRAVENOUS

## 2016-05-26 MED ORDER — SODIUM CHLORIDE 0.9 % WEIGHT BASED INFUSION: 1.0000 mL/kg/h | INTRAVENOUS | Status: DC

## 2016-05-26 MED ORDER — FENTANYL CITRATE (PF) 100 MCG/2ML IJ SOLN
INTRAMUSCULAR | Status: AC
  Filled 2016-05-26: qty 2

## 2016-05-26 MED ORDER — SODIUM CHLORIDE 0.9% FLUSH: 3.0000 mL | INTRAVENOUS | Status: DC | PRN

## 2016-05-26 MED ORDER — SODIUM CHLORIDE 0.9 % IV SOLN: 250.0000 mL | INTRAVENOUS | Status: DC | PRN

## 2016-05-26 MED ORDER — ASPIRIN 81 MG PO CHEW
81.0000 mg | CHEWABLE_TABLET | ORAL | Status: AC
  Administered 2016-05-26: 81 mg via ORAL

## 2016-05-26 MED ORDER — IOPAMIDOL (ISOVUE-370) INJECTION 76%
INTRAVENOUS | Status: DC | PRN
  Administered 2016-05-26: 115 mL via INTRAVENOUS

## 2016-05-26 MED ORDER — SODIUM CHLORIDE 0.9 % WEIGHT BASED INFUSION
3.0000 mL/kg/h | INTRAVENOUS | Status: DC
  Administered 2016-05-26: 3 mL/kg/h via INTRAVENOUS

## 2016-05-26 MED ORDER — SODIUM CHLORIDE 0.9% FLUSH: 3.0000 mL | Freq: Two times a day (BID) | INTRAVENOUS | Status: DC

## 2016-05-26 MED ORDER — ASPIRIN 81 MG PO CHEW
CHEWABLE_TABLET | ORAL | Status: AC
  Filled 2016-05-26: qty 1

## 2016-05-26 MED ORDER — FENTANYL CITRATE (PF) 100 MCG/2ML IJ SOLN
INTRAMUSCULAR | Status: DC | PRN
  Administered 2016-05-26: 25 ug via INTRAVENOUS

## 2016-05-26 MED ORDER — LIDOCAINE HCL (PF) 1 % IJ SOLN
INTRAMUSCULAR | Status: AC
  Filled 2016-05-26: qty 30

## 2016-05-26 MED ORDER — LIDOCAINE HCL (PF) 1 % IJ SOLN
INTRAMUSCULAR | Status: DC | PRN
  Administered 2016-05-26: 25 mL

## 2016-05-26 MED ORDER — HEPARIN (PORCINE) IN NACL 2-0.9 UNIT/ML-% IJ SOLN
INTRAMUSCULAR | Status: AC
  Filled 2016-05-26: qty 1000

## 2016-05-26 MED ORDER — HEPARIN (PORCINE) IN NACL 2-0.9 UNIT/ML-% IJ SOLN
INTRAMUSCULAR | Status: DC | PRN
  Administered 2016-05-26: 1000 mL

## 2016-05-26 MED ORDER — MIDAZOLAM HCL 2 MG/2ML IJ SOLN
INTRAMUSCULAR | Status: AC
  Filled 2016-05-26: qty 2

## 2016-05-26 MED ORDER — SODIUM CHLORIDE 0.9 % WEIGHT BASED INFUSION: 1.0000 mL/kg/h | INTRAVENOUS | Status: AC

## 2016-05-26 SURGICAL SUPPLY — 12 items
CATH INFINITI 5FR MULTPACK ANG (CATHETERS) ×2 IMPLANT
CATH SITESEER 5F MULTI A 2 (CATHETERS) ×2 IMPLANT
CATH SWAN GANZ 7F STRAIGHT (CATHETERS) ×2 IMPLANT
KIT HEART LEFT (KITS) ×3 IMPLANT
KIT HEART RIGHT NAMIC (KITS) ×3 IMPLANT
PACK CARDIAC CATHETERIZATION (CUSTOM PROCEDURE TRAY) ×3 IMPLANT
SHEATH PINNACLE 5F 10CM (SHEATH) ×2 IMPLANT
SHEATH PINNACLE 7F 10CM (SHEATH) ×2 IMPLANT
TRANSDUCER W/STOPCOCK (MISCELLANEOUS) ×4 IMPLANT
TUBING CIL FLEX 10 FLL-RA (TUBING) ×3 IMPLANT
WIRE EMERALD 3MM-J .035X150CM (WIRE) ×2 IMPLANT
WIRE HI TORQ VERSACORE-J 145CM (WIRE) ×2 IMPLANT

## 2016-05-26 NOTE — H&P (View-Only) (Signed)
Cardiology Office Note Date:  05/03/2016   ID:  Matthew [name], DOB 1936-03-03, MRN [id number]  PCP:  Matthew Fairy PA-C  Cardiologist:  Matthew [name]  Chief Complaint  Patient presents with  . New Evaluation    TAVR consult. c/o sob with exertion   History of Present Illness: Matthew [name] is a 80 y.o. male who presents for evaluation of aortic stenosis. He's been followed by Matthew. Fletcher [name]. The patient has extensive coronary artery disease and he has undergone 2 bypass surgeries in the past. His initial surgery was in 1991. His redo CABG was in 2004. He underwent cardiac catheterization last year after he presented with unstable angina. He was noted to have continued patency of the LIMA to LAD, saphenous vein graft OM 2, saphenous vein graft OM 3, saphenous vein graft to the right PDA, and RIMA to diagonal. There was a high-grade stenosis in the saphenous vein graft OM 2 treated with a drug-eluting stent. At that time his peak to peak transaortic gradient was 29 mmHg with a calculated aortic valve area of 0.91 based on Fick cardiac output and the Gorlin formula.  He complains of shortness of breath when he 'gets in a hurry.' Also has 'hurting in the chest' with exertion.  His wife states he can only walk a short distance before he's 'give out' with shortness of breath. Exertional dyspnea has been present for years but has slowly progressed. 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 He denies orthopnea or PND, but he does have some ankle swelling.   He's also had problems with intermittent claudication and has undergone bilateral iliac stenting. Claudication symptoms have resolved since his PTA procedure.   The patient is very hard of hearing. His wife helps with providing details of his history. 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 He denies lightheadedness or syncope.   Past  Medical History  Diagnosis Date  . Hyperlipidemia   . HTN (hypertension)   . Nephrolithiasis   . AS (aortic stenosis)     a. Echo 6/10: EF 55% mild AS; b. echo 06/2015; EF 55-60%, GR1DD, moderate AS, Peak velocity (S): 346 cm/s. Mean gradientS): 28 mm Hg. Peak gradient (S): 48 mm Hg. Valve area (VTI): 1.18 cm2;   c. Echo 4/17 - mild LVH, EF 55-60%, no RWMA, Gr 1 DD, mod to severe AS (mean 28 mmHg, peak 46 mmHg), mild LAE  . Carotid bruit     2009 0-39% on dopplers bilatrally  . OSA (obstructive sleep apnea)   . Myocardial infarction ([place]) 1996  . Shortness of breath   . GERD (gastroesophageal reflux disease)   . HOH (hard of hearing)   . History of kidney stones   . CAD (coronary artery disease)     a. MI 1996 w/ CABG 1996; b.redo CABG 06/2003; c. Myoview 7/09: EF 54% inferobasal infarct, no ischemia. Myoview 6/10 EF 43% inf wall infarct. no ischemia; c. cath 8/16 s/p DES to VG-OM2. OTW 3VD w/ patent VG->RPDA, VG->OM3,  & LIMA->LAD.  EF 55-65%.  Marland Kitchen PAD (peripheral artery disease) ([place]) 02/2014    Subtotal occlusion of right common iliac artery and 70% stenosis in the left common iliac artery. Status post bilateral kissing stent placement. Significant post stenosis aneurysmal dilatation on the right side (any future catheterization through the right femoral artery should be done cautiously to  avoid advancing the wire behind the stent struts)  . Thrombocytopenia [facility])     Past Surgical History  Procedure Laterality Date  . Stomach surgery      removal of gastric ulcers  . Coronary artery bypass graft  1991    at [facility]. Redone 2004-3 vessels 1st time and 4 second time  . Inguinal hernia repair  11/10/2011    Procedure: HERNIA REPAIR INGUINAL ADULT;  Surgeon: Matthew [name];  Location: [facility];  Service: General;  Laterality: Right;  . Left heart catheterization with coronary/graft angiogram N/A 03/06/2014    Procedure: LEFT HEART CATHETERIZATION WITH Matthew [name];  Surgeon: Matthew Hampshire, MD;  Location: [facility] CATH LAB;  Service: Cardiovascular;  Laterality: N/A;  . Abdominal aortagram N/A 03/06/2014    Procedure: ABDOMINAL Matthew [name];  Surgeon: Matthew Hampshire, MD;  Location: [facility] CATH LAB;  Service: Cardiovascular;  Laterality: N/A;  . Cardiac catheterization  02/2014    Severe three-vessel coronary artery disease with patent grafts.   . Cardiac catheterization N/A 07/31/2015    Procedure: Right/Left Heart Cath and Coronary/Graft Angiography;  Surgeon: Matthew Hampshire, MD;  Location: [facility] CV LAB;  Service: Cardiovascular;  Laterality: N/A;  . Cardiac catheterization N/A 07/31/2015    Procedure: Coronary Stent Intervention;  Surgeon: Matthew Hampshire, MD;  Location: [facility] CV LAB;  Service: Cardiovascular;  Laterality: N/A;    Current Outpatient Prescriptions  Medication Sig Dispense Refill  . acetaminophen (Matthew [name]) 325 MG tablet Take 650 mg by mouth daily as needed for mild pain or headache.     Marland Kitchen aspirin EC 81 MG tablet Take 1 tablet (81 mg total) by mouth daily. 90 tablet 3  . atorvastatin (LIPITOR) 80 MG tablet TAKE ONE TABLET BY MOUTH ONCE DAILY 90 tablet 3  . clopidogrel (PLAVIX) 75 MG tablet TAKE ONE TABLET BY MOUTH ONCE DAILY 30 tablet 6  . isosorbide dinitrate (ISORDIL) 10 MG tablet Take 2 tablets (20 mg total) by mouth as directed. TAKE 2 TABS IN THE MORNING AND 1 TAB IN THE PM 270 tablet 3  . lisinopril (PRINIVIL,ZESTRIL) 20 MG tablet TAKE ONE TABLET BY MOUTH ONCE DAILY 90 tablet 3  . metoprolol tartrate (LOPRESSOR) 25 MG tablet Take 0.5 tablets (12.5 mg total) by mouth at bedtime.    . nitroGLYCERIN (NITROSTAT) 0.4 MG SL tablet Place 0.4 mg under the tongue every 5 (five) minutes as needed for chest pain.    . pantoprazole (PROTONIX) 40 MG tablet Take 1 tablet (40 mg total) by mouth daily. 90 tablet 3  . tamsulosin (FLOMAX) 0.4 MG CAPS capsule Take 1 capsule (0.4 mg total) by mouth daily. 30 capsule 3   No current facility-administered  medications for this visit.    Allergies:   Review of patient's allergies indicates no known allergies.   Social History:  The patient  reports that he quit smoking about 26 years ago. His smoking use included Cigarettes. He has a 50 pack-year smoking history. He does not have any smokeless tobacco history on file. He reports that he does not drink alcohol or use illicit drugs.   Family History:  The patient's  family history includes Heart disease in his father and mother.    ROS:  Please see the history of present illness.  Otherwise, review of systems is positive for leg swelling, diarrhea, hard of hearing, easy bruising, snoring.  All other systems are reviewed and negative.    PHYSICAL EXAM: VS:  BP 130/60 mmHg  Pulse 64  Ht 5\' 9"  (1.753 m)  Wt 192 lb 1.9 oz (87.145 kg)  BMI 28.36 kg/m2 , BMI Body mass index is 28.36 kg/(m^2). GEN: Well nourished, well developed, in no acute distress HEENT: normal Neck: no JVD, no masses. bilateral carotid bruits Cardiac: RRR with 3/6 late-peaking harsh systolic murmur with diminished A2            Respiratory:  clear to auscultation bilaterally, normal work of breathing GI: soft, nontender, nondistended, + BS MS: no deformity or atrophy Ext: no pretibial edema Skin: warm and dry, no rash Neuro:  Strength and sensation are intact Psych: euthymic mood, full affect  EKG:  EKG is not ordered today.  Recent Labs: 08/11/2015: ALT 17 04/02/2016: BUN 16; Creat 0.99; Hemoglobin 15.2; Platelets 154; Potassium 3.7; Sodium 138   Lipid Panel     Component Value Date/Time   CHOL 149 08/11/2015 1402   CHOL 133 05/19/2009 2134   TRIG 224* 08/11/2015 1402   HDL 41 08/11/2015 1402   HDL 40 05/19/2009 2134   CHOLHDL 3.6 08/11/2015 1402   CHOLHDL 3.3 Ratio 05/19/2009 2134   VLDL 18 05/19/2009 2134   LDLCALC 63 08/11/2015 1402   LDLCALC 75 05/19/2009 2134      Wt Readings from Last 3 Encounters:  05/03/16 192 lb 1.9 oz (87.145 kg)  04/12/16 190  lb (86.183 kg)  04/02/16 186 lb 1.9 oz (84.423 kg)     Cardiac Studies Reviewed: Cardiac Cath 05/05/2015: Procedures    Coronary Stent Intervention   Right/Left Heart Cath and Coronary/Graft Angiography    Conclusion     Prox RCA lesion, 100% stenosed.  SVG was injected is normal in caliber.  There is mild disease in the graft.  Mid Cx lesion, 100% stenosed.  SVG was injected is normal in caliber.  The graft exhibits minimal luminal irregularities.  was injected is normal in caliber.  There is severe focal disease in the graft.  2nd Mrg lesion, 80% stenosed.  LIMA was visualized by non-selective angiography due to inability to cannulate and is normal in caliber, and is anatomically normal.  Prox LAD lesion, 100% stenosed.  Prox Graft lesion, 85% stenosed. There is a 0% residual stenosis post intervention. The lesion was not previously treated.  A drug-eluting stent was placed.  The left ventricular systolic function is normal.  1. Severe underlying three-vessel coronary artery disease with patent grafts including LIMA to LAD, SVG to OM2, SVG to OM 3, SVG to right PDA and RIMA to diagonal. There is significant thrombotic lesion and SVG to OM 2. The RIMA could not be injected due to severe tortuosity of the right innominate artery but there is known to be patent. 2. Normal filling pressures and cardiac output. Moderate to severe aortic stenosis. This has progressed since last study with a peak to peak gradient of 29 mmHg and valve area of 0.91. 3. Normal LV systolic function. 4. Successful drug-eluting stent placement to SVG to OM 2.  Recommendations: Dual antiplatelets therapy for at least one year. The patient will likely require intervention in his aortic valve in the near future most likely via TAVR.  Please note that there was difficulty in advancing the catheters via the right common iliac artery stent due to severe post stenotic dilatation. In the future,  should consider access via the left common femoral artery.   2D Echo 04-07-2015: Left ventricle: The cavity size was normal. There was mild concentric hypertrophy. Systolic function was normal. The estimated  ejection fraction was in the range of 55% to 60%. Wall motion was normal; there were no regional wall motion abnormalities. Doppler parameters are consistent with abnormal left ventricular relaxation (grade 1 diastolic dysfunction).  ------------------------------------------------------------------- Aortic valve: Trileaflet; mildly thickened, moderately calcified leaflets. Mobility was not restricted. Doppler: There was moderate to severe stenosis. There was mild regurgitation. VTI ratio of LVOT to aortic valve: 0.25. Valve area (VTI): 0.94 cm^2. Indexed valve area (VTI): 0.46 cm^2/m^2. Peak velocity ratio of LVOT to aortic valve: 0.26. Valve area (Vmax): 0.89 cm^2. Indexed valve area (Vmax): 0.44 cm^2/m^2. Mean velocity ratio of LVOT to aortic valve: 0.24. Valve area (Vmean): 0.84 cm^2. Indexed valve area (Vmean): 0.41 cm^2/m^2. Mean gradient (S): 28 mm Hg. Peak gradient (S): 46 mm Hg.  ------------------------------------------------------------------- Aorta: Aortic root: The aortic root was normal in size.  ------------------------------------------------------------------- Mitral valve: Structurally normal valve. Mobility was not restricted. Doppler: Transvalvular velocity was within the normal range. There was no evidence for stenosis. There was no regurgitation. Peak gradient (D): 2 mm Hg.  ------------------------------------------------------------------- Left atrium: The atrium was mildly dilated.  ------------------------------------------------------------------- Right ventricle: The cavity size was normal. Wall thickness was normal. Systolic function was  normal.  ------------------------------------------------------------------- Pulmonic valve: Doppler: Transvalvular velocity was within the normal range. There was no evidence for stenosis. There was trivial regurgitation.  ------------------------------------------------------------------- Tricuspid valve: Structurally normal valve. Doppler: Transvalvular velocity was within the normal range. There was trivial regurgitation.  ------------------------------------------------------------------- Pulmonary artery: The main pulmonary artery was normal-sized. Systolic pressure was within the normal range.  ------------------------------------------------------------------- Right atrium: The atrium was normal in size.  ------------------------------------------------------------------- Pericardium: There was no pericardial effusion.  ------------------------------------------------------------------- Systemic veins: Inferior vena cava: The vessel was normal in size.  STS Risk Calculator: Procedure: AV Replacement  Risk of Mortality: 3.892%  Morbidity or Mortality: 22.934%  Long Length of Stay: 7.87%  Short Length of Stay: 27.869%  Permanent Stroke: 2.572%  Prolonged Ventilation: 14.05%  DSW Infection: 0.428%  Renal Failure: 5.055%  Reoperation: 9.025%   ASSESSMENT AND PLAN: 80 yo male with Stage D Severe aortic stenosis: I have personally reviewed the patient's cardiac catheterization, peripheral vascular angiogram studies and interventional procedures, and 2-D echocardiogram images. Despite the fact that his transvalvular gradients are in the moderate range, his aortic valve area is less than 1 cm and his symptoms/physical exam are clearly consistent with severe aortic stenosis. He has both symptoms of angina and progressive shortness of breath. I have reviewed the natural history of aortic stenosis with the patient and his wife who is present today. We have  discussed the limitations of medical therapy and the poor prognosis associated with symptomatic aortic stenosis. We have reviewed potential treatment options, including palliative medical therapy, conventional surgical aortic valve replacement, and transcatheter aortic valve replacement. We discussed treatment options in the context of this patient's specific comorbid medical conditions.   With 2 prior CABG surgeries, I think TAVR would be a good treatment option for this patient, potentially associated with less morbidity than redo surgical aortic valve replacement. Will refer him to Matthew Servando Snare who did his most recent CABG for a formal surgical consultation. Will arrange CTA studies of the heart and of the chest/abdomen/pelvis. I suspect he will need repeat cardiac cath since he had vein graft PCI last August.  Current medicines are reviewed with the patient today.  The patient does not have concerns regarding medicines.  Labs/ tests ordered today include:  No orders of the defined types were placed in this encounter.  Disposition:   FU after studies/surgical evaluation completed  Signed, Sherren Mocha, MD  05/03/2016 1:40 PM    [facility] [address] [place], [place], [place]  [address] Phone: [phone]; Fax: [phone]

## 2016-05-26 NOTE — Discharge Instructions (Signed)
Angiogram, Care After °Refer to this sheet in the next few weeks. These instructions provide you with information about caring for yourself after your procedure. Your health care provider may also give you more specific instructions. Your treatment has been planned according to current medical practices, but problems sometimes occur. Call your health care provider if you have any problems or questions after your procedure. °WHAT TO EXPECT AFTER THE PROCEDURE °After your procedure, it is typical to have the following: °· Bruising at the catheter insertion site that usually fades within 1-2 weeks. °· Blood collecting in the tissue (hematoma) that may be painful to the touch. It should usually decrease in size and tenderness within 1-2 weeks. °HOME CARE INSTRUCTIONS °· Take medicines only as directed by your health care provider. °· You may shower 24-48 hours after the procedure or as directed by your health care provider. Remove the bandage (dressing) and gently wash the site with plain soap and water. Pat the area dry with a clean towel. Do not rub the site, because this may cause bleeding. °· Do not take baths, swim, or use a hot tub until your health care provider approves. °· Check your insertion site every day for redness, swelling, or drainage. °· Do not apply powder or lotion to the site. °· Do not lift over 10 lb (4.5 kg) for 5 days after your procedure or as directed by your health care provider. °· Ask your health care provider when it is okay to: °¨ Return to work or school. °¨ Resume usual physical activities or sports. °¨ Resume sexual activity. °· Do not drive home if you are discharged the same day as the procedure. Have someone else drive you. °· You may drive 24 hours after the procedure unless otherwise instructed by your health care provider. °· Do not operate machinery or power tools for 24 hours after the procedure or as directed by your health care provider. °· If your procedure was done as an  outpatient procedure, which means that you went home the same day as your procedure, a responsible adult should be with you for the first 24 hours after you arrive home. °· Keep all follow-up visits as directed by your health care provider. This is important. °SEEK MEDICAL CARE IF: °· You have a fever. °· You have chills. °· You have increased bleeding from the catheter insertion site. Hold pressure on the site.  CALL 911 °SEEK IMMEDIATE MEDICAL CARE IF: °· You have unusual pain at the catheter insertion site. °· You have redness, warmth, or swelling at the catheter insertion site. °· You have drainage (other than a small amount of blood on the dressing) from the catheter insertion site. °· The catheter insertion site is bleeding, and the bleeding does not stop after 30 minutes of holding steady pressure on the site. °· The area near or just beyond the catheter insertion site becomes pale, cool, tingly, or numb. °  °This information is not intended to replace advice given to you by your health care provider. Make sure you discuss any questions you have with your health care provider. °  °Document Released: 06/17/2005 Document Revised: 12/20/2014 Document Reviewed: 05/02/2013 °Elsevier Interactive Patient Education ©2016 [employer] Inc[employer] ° °

## 2016-05-26 NOTE — Progress Notes (Signed)
Site area: left groin Site Prior to Removal:  Level 0 Pressure Applied For:  25 minutes Manual:   yes Patient Status During Pull:  stable Post Pull Site:  Level  0 Post Pull Instructions Given:  yes Post Pull Pulses Present: yes Dressing Applied:  tegaderm Bedrest begins @ 1510 Comments:

## 2016-05-26 NOTE — Interval H&P Note (Signed)
History and Physical Interval Note:  05/26/2016 1:32 PM  Matthew [name]  has presented today for surgery, with the diagnosis of arotic stenosis  The various methods of treatment have been discussed with the patient and family. After consideration of risks, benefits and other options for treatment, the patient has consented to  Procedure(s): Right/Left Heart Cath and Coronary Angiography (N/A) as a surgical intervention .  The patient's history has been reviewed, patient examined, no change in status, stable for surgery.  I have reviewed the patient's chart and labs.  Questions were answered to the patient's satisfaction.     [name]

## 2016-05-27 ENCOUNTER — Encounter (HOSPITAL_COMMUNITY): Payer: Self-pay | Admitting: Cardiovascular Disease

## 2016-05-27 NOTE — Pre-Procedure Instructions (Signed)
Matthew [name]  05/27/2016      [facility] [address], [place] [address] [place] [address] Phone: [phone] Fax: [phone]    Your procedure is scheduled on Tuesday June 20   Report to [facility] Admitting at 10:30 A.M.   Call this number if you have problems the morning of surgery:  [phone]   Remember:  Do not eat food or drink liquids after midnight   Take these medicines the morning of surgery with A SIP OF WATER Tylenol if needed, isosorbide dinitrate (Isordil),   nitro if needed, pantoprazole (protonix), tamsulosin (flomax)   Do not wear jewelry,  Do not wear lotions, powders, or perfumes.  You may not wear deoderant.  Do not shave 48 hours prior to surgery.  Men may shave face and neck .  Do not bring valuables to the hospital.  [facility] is not responsible for any belongings or valuables.  Contacts, dentures or bridgework may not be worn into surgery.  Leave your suitcase in the car.  After surgery it may be brought to your room.  For patients admitted to the hospital, discharge time will be determined by your treatment team.  Patients discharged the day of surgery will not be allowed to drive home.   Special instructions:   [facility]- Preparing For Surgery  Before surgery, you can play an important role. Because skin is not sterile, your skin needs to be as free of germs as possible. You can reduce the number of germs on your skin by washing with CHG (chlorahexidine gluconate) Soap before surgery.  CHG is an antiseptic cleaner which kills germs and bonds with the skin to continue killing germs even after washing.  Please do not use if you have an allergy to CHG or antibacterial soaps. If your skin becomes reddened/irritated stop using the CHG.  Do not shave (including legs and underarms) for at least 48 hours prior to first CHG shower. It is OK to shave your face.  Please follow these instructions  carefully.   1. Shower the NIGHT BEFORE SURGERY and the MORNING OF SURGERY with CHG.   2. If you chose to wash your hair, wash your hair first as usual with your normal shampoo.  3. After you shampoo, rinse your hair and body thoroughly to remove the shampoo.  4. Use CHG as you would any other liquid soap. You can apply CHG directly to the skin and wash gently with a scrungie or a clean washcloth.   5. Apply the CHG Soap to your body ONLY FROM THE NECK DOWN.  Do not use on open wounds or open sores. Avoid contact with your eyes, ears, mouth and genitals (private parts). Wash genitals (private parts) with your normal soap.  6. Wash thoroughly, paying special attention to the area where your surgery will be performed.  7. Thoroughly rinse your body with warm water from the neck down.  8. DO NOT shower/wash with your normal soap after using and rinsing off the CHG Soap.  9. Pat yourself dry with a CLEAN TOWEL.   10. Wear CLEAN PAJAMAS   11. Place CLEAN SHEETS on your bed the night of your first shower and DO NOT SLEEP WITH PETS.    Day of Surgery: Do not apply any deodorants/lotions. Please wear clean clothes to the hospital/surgery center.      Please read over the following fact sheets that you were given. Coughing  and Deep Breathing, MRSA Information and Surgical Site Infection Prevention

## 2016-05-28 ENCOUNTER — Encounter (HOSPITAL_COMMUNITY)
Admission: RE | Admit: 2016-05-28 | Discharge: 2016-05-28 | Disposition: A | Discharge status: Home / Self Care | Payer: Medicare Other | Source: Ambulatory Visit | Attending: Cardiovascular Disease | Admitting: Cardiovascular Disease

## 2016-05-28 ENCOUNTER — Encounter (HOSPITAL_COMMUNITY): Payer: Self-pay

## 2016-05-28 VITALS — BP 158/62 | HR 65 | Temp 97.6°F | Resp 18 | Ht 69.0 in | Wt 191.4 lb

## 2016-05-28 DIAGNOSIS — R9431 Abnormal electrocardiogram [ECG] [EKG]: Secondary | ICD-10-CM | POA: Insufficient documentation

## 2016-05-28 DIAGNOSIS — I350 Nonrheumatic aortic (valve) stenosis: Secondary | ICD-10-CM | POA: Diagnosis not present

## 2016-05-28 DIAGNOSIS — Z955 Presence of coronary angioplasty implant and graft: Secondary | ICD-10-CM | POA: Diagnosis not present

## 2016-05-28 DIAGNOSIS — I739 Peripheral vascular disease, unspecified: Secondary | ICD-10-CM | POA: Diagnosis not present

## 2016-05-28 DIAGNOSIS — Z0183 Encounter for blood typing: Secondary | ICD-10-CM | POA: Insufficient documentation

## 2016-05-28 DIAGNOSIS — Z01818 Encounter for other preprocedural examination: Secondary | ICD-10-CM | POA: Diagnosis present

## 2016-05-28 DIAGNOSIS — E785 Hyperlipidemia, unspecified: Secondary | ICD-10-CM | POA: Insufficient documentation

## 2016-05-28 DIAGNOSIS — K219 Gastro-esophageal reflux disease without esophagitis: Secondary | ICD-10-CM | POA: Diagnosis not present

## 2016-05-28 DIAGNOSIS — Z79899 Other long term (current) drug therapy: Secondary | ICD-10-CM | POA: Insufficient documentation

## 2016-05-28 DIAGNOSIS — I252 Old myocardial infarction: Secondary | ICD-10-CM | POA: Diagnosis not present

## 2016-05-28 DIAGNOSIS — I2510 Atherosclerotic heart disease of native coronary artery without angina pectoris: Secondary | ICD-10-CM | POA: Insufficient documentation

## 2016-05-28 DIAGNOSIS — Z01812 Encounter for preprocedural laboratory examination: Secondary | ICD-10-CM | POA: Insufficient documentation

## 2016-05-28 DIAGNOSIS — G4733 Obstructive sleep apnea (adult) (pediatric): Secondary | ICD-10-CM | POA: Insufficient documentation

## 2016-05-28 DIAGNOSIS — Z7982 Long term (current) use of aspirin: Secondary | ICD-10-CM | POA: Insufficient documentation

## 2016-05-28 DIAGNOSIS — I10 Essential (primary) hypertension: Secondary | ICD-10-CM | POA: Insufficient documentation

## 2016-05-28 DIAGNOSIS — Z87891 Personal history of nicotine dependence: Secondary | ICD-10-CM | POA: Insufficient documentation

## 2016-05-28 DIAGNOSIS — R001 Bradycardia, unspecified: Secondary | ICD-10-CM | POA: Diagnosis not present

## 2016-05-28 DIAGNOSIS — Z951 Presence of aortocoronary bypass graft: Secondary | ICD-10-CM | POA: Insufficient documentation

## 2016-05-28 LAB — COMPREHENSIVE METABOLIC PANEL
ALK PHOS: 59 U/L (ref 38–126)
ALT: 13 U/L — ABNORMAL LOW (ref 17–63)
ANION GAP: 5 (ref 5–15)
AST: 15 U/L (ref 15–41)
Albumin: 3.5 g/dL (ref 3.5–5.0)
BUN: 10 mg/dL (ref 6–20)
CALCIUM: 9 mg/dL (ref 8.9–10.3)
CO2: 23 mmol/L (ref 22–32)
Chloride: 112 mmol/L — ABNORMAL HIGH (ref 101–111)
Creatinine, Ser: 0.95 mg/dL (ref 0.61–1.24)
GFR calc non Af Amer: >60 mL/min (ref >60)
Glucose, Bld: 84 mg/dL (ref 65–99)
POTASSIUM: 3.5 mmol/L (ref 3.5–5.1)
SODIUM: 140 mmol/L (ref 135–145)
TOTAL PROTEIN: 6.1 g/dL — AB (ref 6.5–8.1)
Total Bilirubin: 2.1 mg/dL — ABNORMAL HIGH (ref 0.3–1.2)

## 2016-05-28 LAB — BLOOD GAS, ARTERIAL
Acid-Base Excess: 0.5 mmol/L (ref 0.0–2.0)
BICARBONATE: 24.6 meq/L — AB (ref 20.0–24.0)
Drawn by: 421801
FIO2: 0.21
O2 Saturation: 96.2 %
PCO2 ART: 39.6 mmHg (ref 35.0–45.0)
PH ART: 7.411 (ref 7.350–7.450)
PO2 ART: 83.6 mmHg (ref 80.0–100.0)
Patient temperature: 98.6
TCO2: 25.9 mmol/L (ref 0–100)

## 2016-05-28 LAB — CBC
HCT: 38 % — ABNORMAL LOW (ref 39.0–52.0)
HEMOGLOBIN: 12.2 g/dL — AB (ref 13.0–17.0)
MCH: 29.4 pg (ref 26.0–34.0)
MCHC: 32.1 g/dL (ref 30.0–36.0)
MCV: 91.6 fL (ref 78.0–100.0)
Platelets: 95 10*3/uL — ABNORMAL LOW (ref 150–400)
RBC: 4.15 MIL/uL — ABNORMAL LOW (ref 4.22–5.81)
RDW: 13.1 % (ref 11.5–15.5)
WBC: 5.2 10*3/uL (ref 4.0–10.5)

## 2016-05-28 LAB — URINALYSIS, ROUTINE W REFLEX MICROSCOPIC
BILIRUBIN URINE: NEGATIVE
Glucose, UA: NEGATIVE mg/dL
HGB URINE DIPSTICK: NEGATIVE
Ketones, ur: NEGATIVE mg/dL
Leukocytes, UA: NEGATIVE
NITRITE: NEGATIVE
PROTEIN: NEGATIVE mg/dL
Specific Gravity, Urine: 1.025 (ref 1.005–1.030)
pH: 6 (ref 5.0–8.0)

## 2016-05-28 LAB — PROTIME-INR
INR: 1.14 (ref 0.00–1.49)
Prothrombin Time: 14.8 seconds (ref 11.6–15.2)

## 2016-05-28 LAB — SURGICAL PCR SCREEN
MRSA, PCR: NEGATIVE
Staphylococcus aureus: NEGATIVE

## 2016-05-28 LAB — APTT: APTT: 37 s (ref 24–37)

## 2016-05-28 LAB — ABO/RH: ABO/RH(D): O NEG

## 2016-05-29 LAB — HEMOGLOBIN A1C
HEMOGLOBIN A1C: 5.1 % (ref 4.8–5.6)
MEAN PLASMA GLUCOSE: 100 mg/dL

## 2016-05-31 IMAGING — CR DG CHEST 2V
1 series · 2 of 2 positions shown · non-contrast
Comparison: 06/04/2015

CLINICAL DATA: Preop catheterization.

EXAM:
CHEST  2 VIEW

[Series 1: dg chest 2 view · 0.14mm/px · 2 of 2 slices shown]
[im 1/2]
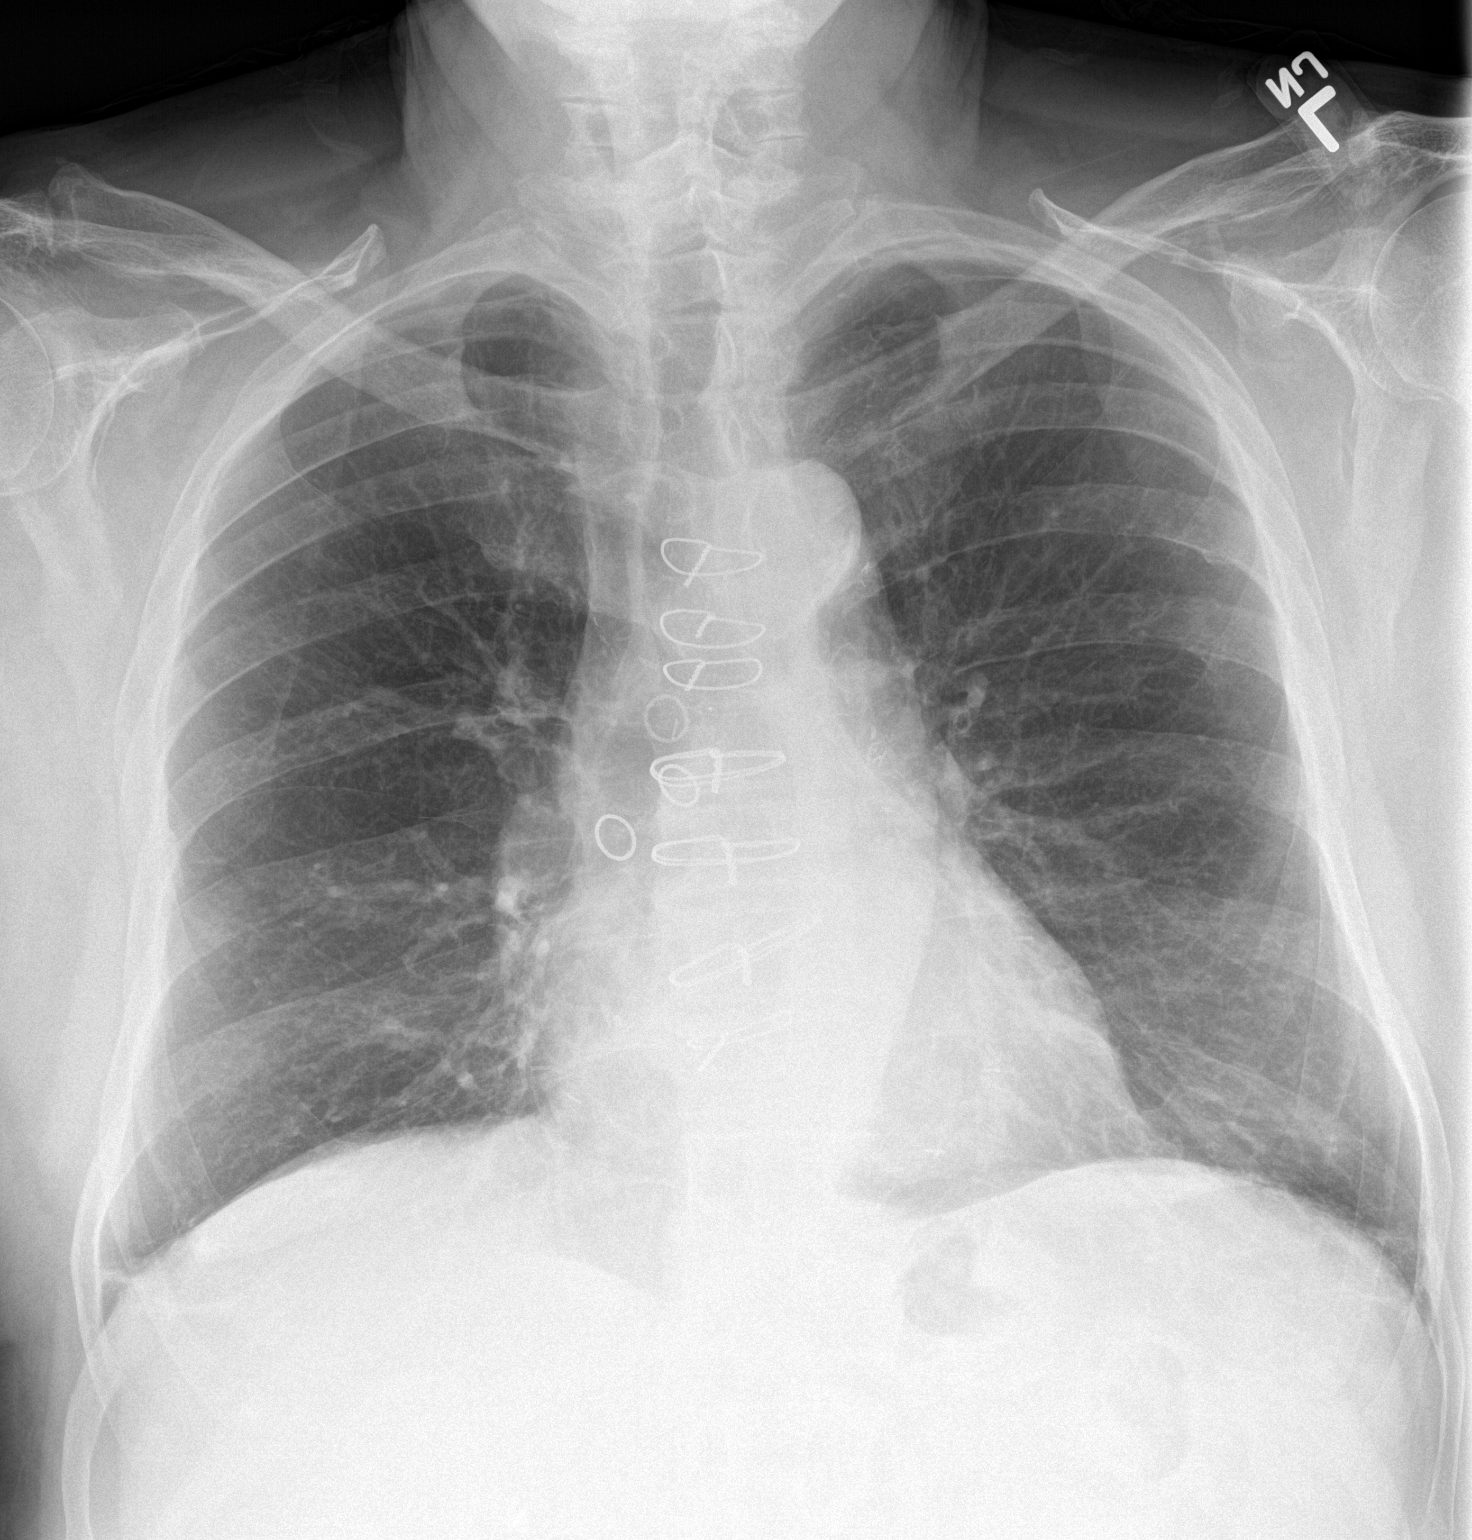
[im 2/2]
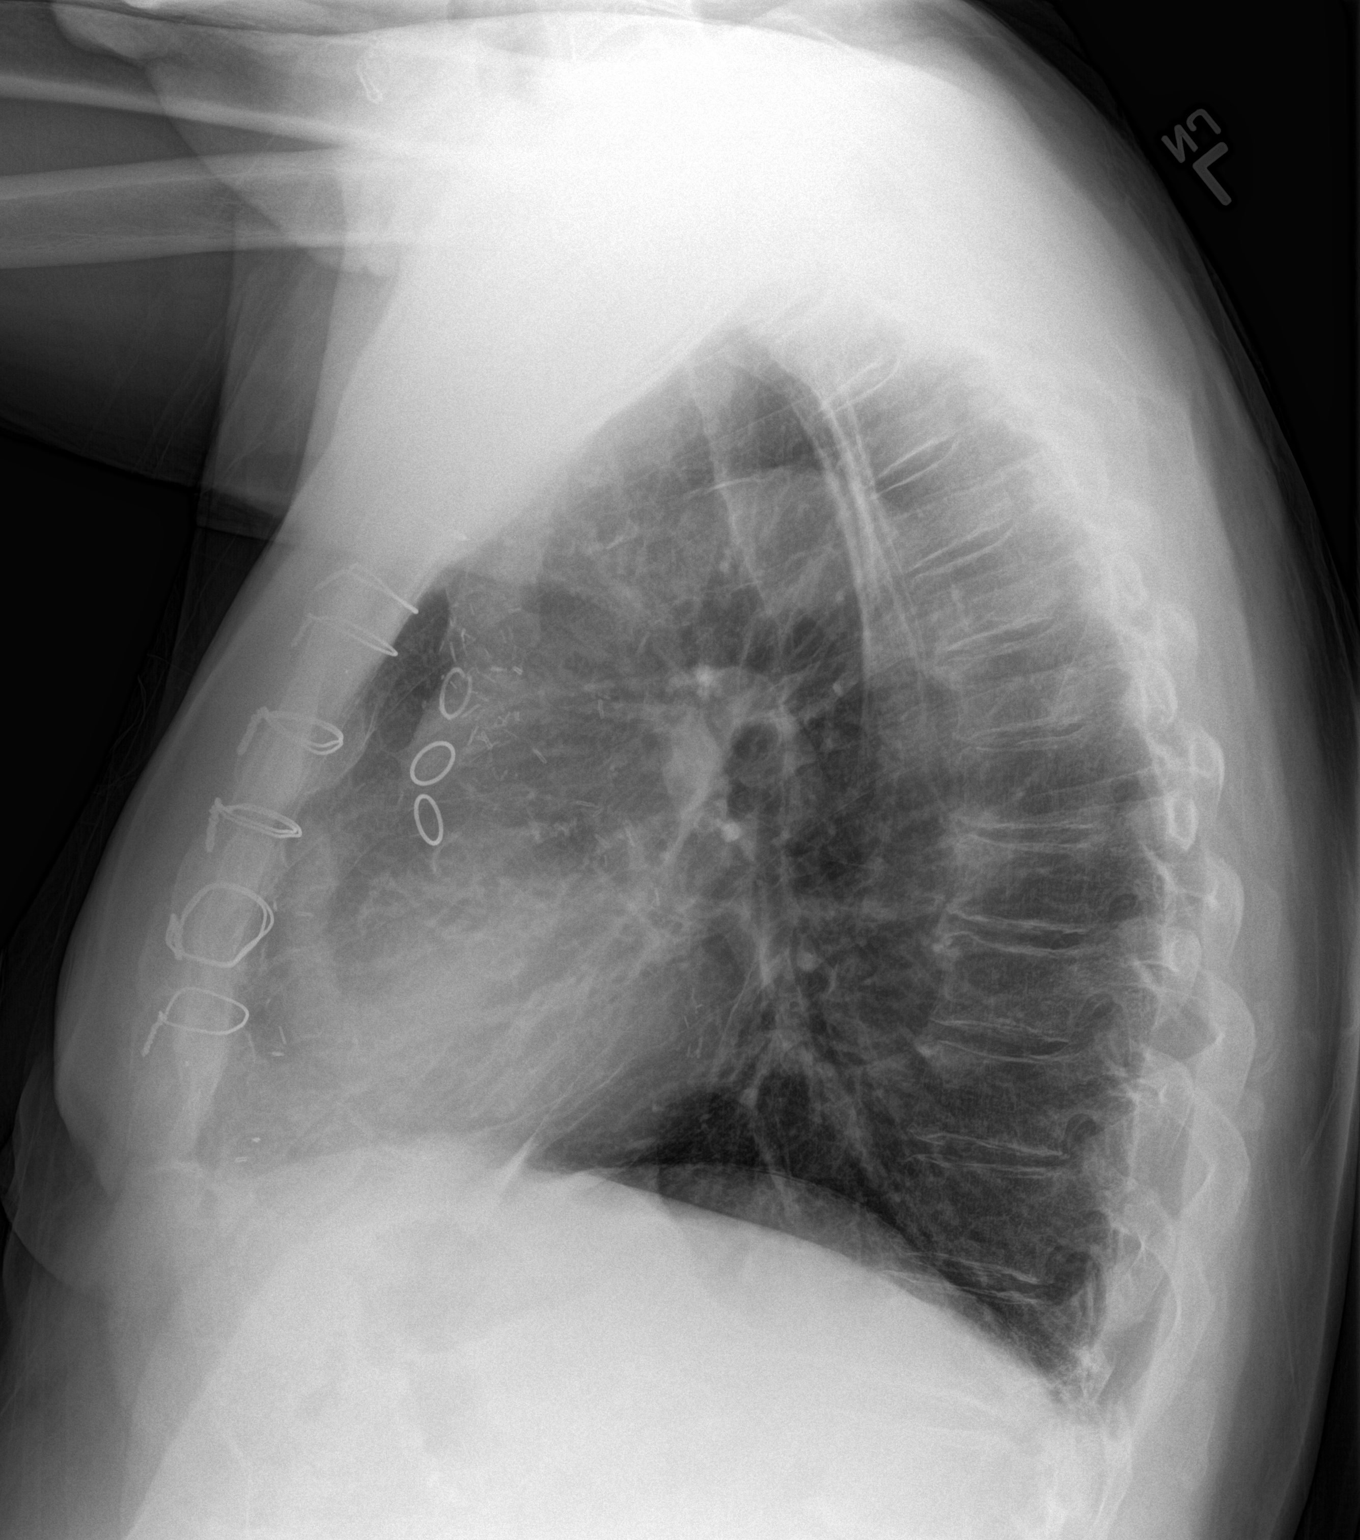

[2 of 2 positions shown; findings below may reference images not displayed]

FINDINGS: There is mild right basilar scarring. There is no focal parenchymal
opacity. There is no pleural effusion or pneumothorax. The heart and
mediastinal contours are unremarkable. There is evidence of prior
CABG.

The osseous structures are unremarkable.
IMPRESSION: No active cardiopulmonary disease.

## 2016-05-31 MED ORDER — CHLORHEXIDINE GLUCONATE 0.12 % MT SOLN: 15.0000 mL | Freq: Once | OROMUCOSAL | Status: DC

## 2016-05-31 MED ORDER — NITROGLYCERIN IN D5W 200-5 MCG/ML-% IV SOLN
2.0000 ug/min | INTRAVENOUS | Status: DC
  Filled 2016-05-31: qty 250

## 2016-05-31 MED ORDER — POTASSIUM CHLORIDE 2 MEQ/ML IV SOLN
80.0000 meq | INTRAVENOUS | Status: DC
  Filled 2016-05-31: qty 40

## 2016-05-31 MED ORDER — SODIUM CHLORIDE 0.9 % IV SOLN
INTRAVENOUS | Status: DC
  Filled 2016-05-31: qty 2.5

## 2016-05-31 MED ORDER — MAGNESIUM SULFATE 50 % IJ SOLN
40.0000 meq | INTRAMUSCULAR | Status: DC
  Filled 2016-05-31: qty 10

## 2016-05-31 MED ORDER — SODIUM CHLORIDE 0.9 % IV SOLN: INTRAVENOUS | Status: DC

## 2016-05-31 MED ORDER — SODIUM CHLORIDE 0.9 % IV SOLN
INTRAVENOUS | Status: DC
  Filled 2016-05-31: qty 30

## 2016-05-31 MED ORDER — EPINEPHRINE HCL 1 MG/ML IJ SOLN
0.0000 ug/min | INTRAVENOUS | Status: DC
  Filled 2016-05-31: qty 4

## 2016-05-31 MED ORDER — PHENYLEPHRINE HCL 10 MG/ML IJ SOLN
30.0000 ug/min | INTRAVENOUS | Status: DC
  Filled 2016-05-31: qty 2

## 2016-05-31 MED ORDER — NOREPINEPHRINE BITARTRATE 1 MG/ML IV SOLN
0.0000 ug/min | INTRAVENOUS | Status: DC
  Filled 2016-05-31: qty 4

## 2016-05-31 MED ORDER — SODIUM CHLORIDE 0.9 % IV SOLN
1500.0000 mg | INTRAVENOUS | Status: AC
  Administered 2016-06-01: 1500 mg via INTRAVENOUS
  Filled 2016-05-31: qty 1500

## 2016-05-31 MED ORDER — CEFUROXIME SODIUM 1.5 G IJ SOLR
1.5000 g | INTRAMUSCULAR | Status: AC
  Administered 2016-06-01: 1.5 g via INTRAVENOUS
  Filled 2016-05-31: qty 1.5

## 2016-05-31 MED ORDER — DEXMEDETOMIDINE HCL IN NACL 400 MCG/100ML IV SOLN
0.1000 ug/kg/h | INTRAVENOUS | Status: AC
Start: 2016-06-01 — End: 2016-06-01
  Administered 2016-06-01: .3 ug/kg/h via INTRAVENOUS
  Filled 2016-05-31: qty 100

## 2016-05-31 MED ORDER — DOPAMINE-DEXTROSE 3.2-5 MG/ML-% IV SOLN
0.0000 ug/kg/min | INTRAVENOUS | Status: DC
Start: 2016-06-01 — End: 2016-06-01
  Filled 2016-05-31: qty 250

## 2016-05-31 NOTE — Progress Notes (Signed)
Anesthesia Chart Review: Patient is a 80 year old male scheduled for TAVR, transfemoral approach on 06/01/16 by Dr. Burt Knack.   History includes severe AS, CAD/MI s/p CABG '96 with redo CABG '04 and DES to SVG-OM2 07/2015, HTN, HLD, former smoker, nephrolithiasis, OSA, GERD, hard of hearing, PAD s/p bilateral iliac stents, thrombocytopenia, exertional dyspnea, right inguinal hernia repair '12.   PCP is Neysa Hotter, PA-C. Cardiologist is Dr. [name].  Meds include ASA 81 mg, Lipitor, Isordil, lisinopril, Lopressor, Nitro, Protonix, Flomax. Per Dr. Vivi Martens note, he will need to stop Plavix 5 days prior to surgery (DES > 6 months). (Plavix is not currently listed on his med list. Was held for dental extractions on 05/27/16.)  05/26/16 RHC/LHC:  Prox RCA lesion, 100% stenosed.  SVG was injected is normal in caliber.  There is mild diffuse disease in the graft.  Mid Cx lesion, 100% stenosed.  SVG was injected is normal in caliber.  The graft exhibits minimal luminal irregularities.  was injected is normal in caliber.  There is severe focal disease in the graft.  2nd Mrg lesion, 80% stenosed.  LIMA due to inability to cannulate and is normal in caliber, and is anatomically normal.  Prox LAD lesion, 100% stenosed.  Mid Graft lesion, 50% stenosed.  RIMA was visualized by non-selective angiography is and is anatomically normal. 1. Right heart catheterization showed high normal pulmonary pressure and filling pressures. Normal cardiac output. 2. Severe underlying three-vessel coronary artery disease with patent grafts. 3. Moderate left subclavian stenosis not significant by gradients. Recommendations: No revascularization is needed before TAVR.   04/06/16 Echo: Study Conclusions - Left ventricle: The cavity size was normal. There was mild  concentric hypertrophy. Systolic function was normal. The  estimated ejection fraction was in the range of 55% to 60%. Wall  motion was  normal; there were no regional wall motion  abnormalities. Doppler parameters are consistent with abnormal  left ventricular relaxation (grade 1 diastolic dysfunction). - Aortic valve: There was moderate to severe stenosis. There was  mild regurgitation. Mean gradient (S): 28 mm Hg. Peak gradient  (S): 46 mm Hg. Valve area (VTI): 0.94 cm^2. - Left atrium: The atrium was mildly dilated. - Pulmonary arteries: Systolic pressure was within the normal  range.  By notes, carotid duplex ~ 03/2008 showed 0-39% BICA stenosis.   Preoperative EKG, Cardiac CT, CTA (chest/abd/pelvis; bilateral CIA stents patent), PFTs, and CXR noted.  01/26/16 Abdominal aorta U/S: - Normal caliber abdominal aorta, common and external iliac arteries, without focal dilatation. - Aorto-iliac atherosclerosis. - > 50% stenosis right common iliac artery. - Patent common iliac artery stents, bilaterally. - Patent IVC  Preoperative labs noted. Cr 0.95. AST 15, ALT 13. H/H 12.2/38.0. PLT 95K (previously 103-162 since 07/2015), PT/PTT WNL. A1c 5.1. UA WNL. Labs marked as reviewed in Epic by Dr. Burt Knack.  I spoke with Ebony Hail at [facility] about upcoming TAVR. There was questions regarding if he followed through with dental extractions (he was referred to Dr. Benson Norway in the absence of Dr. Enrique Sack). I also reviewed his PLT count as well and inquired about his Plavix (not listed but per TCTS to hold for 5 days pre-op). She called and spoke with Mr. Nations who confirmed teeth were extracted on Thursday by Dr. Benson Norway. Plavix on hold. Cardiology/TCTS will need to instruct him on when/if to resume post-opratively.  Further evaluation on the day of surgery by his surgeons/anesthesiologist.  George Hugh [facility] Short Stay Center/Anesthesiology Phone [phone] 05/31/2016 12:17 PM

## 2016-05-31 NOTE — H&P (Signed)
[address]       [place] [address]             [phone]      Cardiothoracic Surgery History and Physical   Referring Provider is Grace Isaac, MD PCP is Antionette Fairy, PA-C  Chief Complaint  Patient presents with  . Aortic Stenosis        HPI:  The patient is a 80 year old gentleman with hypertension, hyperlipidemia, aortic stenosis and a long history of coronary artery disease s/p CABG by Dr. Servando Snare in 1991 and redo CABG in 2004. He reports at least 9-12 months of shortness of breath and chest pain with exertion. He had a cardiac cath in 07/2015 that showed continued patency of the LIMA to LAD, saphenous vein graft to OM 2, saphenous vein graft to OM 3, saphenous vein graft to the right PDA, and RIMA to the diagonal. There was a high-grade stenosis in the saphenous vein graft to OM 2 that was treated with a drug-eluting stent. At that time his peak to peak transaortic gradient was 29 mmHg with a calculated aortic valve area of 0.91. He says that he continued to have the same symptoms after the PCI and over the past 3 months his wife has noticed that he gets very short of breath with any significant activity. His echo on 07/07/2015 showed a mean gradient of 28 mm Hg. A recent echo on 04/06/2016 showed a mean gradient that was still 28 mm Hg. The dimensionless index was 0.26 and the valve area 0.89 cm2. The LVEF was 55-60%.   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 He has some swelling in his legs. He denies dizziness or syncope.     Past Medical History  Diagnosis Date  . Hyperlipidemia   . HTN (hypertension)   . Nephrolithiasis   . AS (aortic stenosis)     a. Echo 6/10: EF 55% mild AS; b. echo 06/2015; EF 55-60%, GR1DD, moderate AS,  Peak velocity (S): 346 cm/s. Mean gradientS): 28 mm Hg. Peak gradient (S): 48 mm Hg. Valve area (VTI): 1.18 cm2; c. Echo 4/17 - mild LVH, EF 55-60%, no RWMA, Gr 1 DD, mod to severe AS (mean 28 mmHg, peak 46 mmHg), mild LAE  . Carotid bruit     2009 0-39% on dopplers bilatrally  . OSA (obstructive sleep apnea)   . Myocardial infarction ([facility]) 1996  . Shortness of breath   . GERD (gastroesophageal reflux disease)   . HOH (hard of hearing)   . History of kidney stones   . CAD (coronary artery disease)     a. MI 1996 w/ CABG 1996; b.redo CABG 06/2003; c. Myoview 7/09: EF 54% inferobasal infarct, no ischemia. Myoview 6/10 EF 43% inf wall infarct. no ischemia; c. cath 8/16 s/p DES to VG-OM2. OTW 3VD w/ patent VG->RPDA, VG->OM3, & LIMA->LAD. EF 55-65%.  Marland Kitchen PAD (peripheral artery disease) ([facility]) 02/2014    Subtotal occlusion of right common iliac artery and 70% stenosis in the left common iliac artery. Status post bilateral kissing stent placement. Significant post stenosis aneurysmal dilatation on the right side (any future catheterization through the right femoral artery should be done cautiously to avoid advancing the wire behind the  stent struts)  . Thrombocytopenia [facility])     Past Surgical History  Procedure Laterality Date  . Stomach surgery      removal of gastric ulcers  . Coronary artery bypass graft  1991    at [facility]. Redone 2004-3 vessels 1st time and 4 second time  . Inguinal hernia repair  11/10/2011    Procedure: HERNIA REPAIR INGUINAL ADULT; Surgeon: Donato Heinz; Location: [facility] ORS; Service: General; Laterality: Right;  . Left heart catheterization with coronary/graft angiogram N/A 03/06/2014    Procedure: LEFT HEART CATHETERIZATION WITH Beatrix Fetters; Surgeon: Wellington Hampshire, MD; Location: [facility] CATH LAB; Service: Cardiovascular; Laterality: N/A;  . Abdominal aortagram N/A 03/06/2014     Procedure: ABDOMINAL Maxcine Ham; Surgeon: Wellington Hampshire, MD; Location: [place] CATH LAB; Service: Cardiovascular; Laterality: N/A;  . Cardiac catheterization  02/2014    Severe three-vessel coronary artery disease with patent grafts.   . Cardiac catheterization N/A 07/31/2015    Procedure: Right/Left Heart Cath and Coronary/Graft Angiography; Surgeon: Wellington Hampshire, MD; Location: [place] CV LAB; Service: Cardiovascular; Laterality: N/A;  . Cardiac catheterization N/A 07/31/2015    Procedure: Coronary Stent Intervention; Surgeon: Wellington Hampshire, MD; Location: [place] CV LAB; Service: Cardiovascular; Laterality: N/A;    Family History  Problem Relation Age of Onset  . Heart disease Father   . Heart disease Mother     Social History   Social History  . Marital Status: Married    Spouse Name: N/A  . Number of Children: N/A  . Years of Education: N/A   Occupational History  . Not on file.   Social History Main Topics  . Smoking status: Former Smoker -- 1.00 packs/day for 50 years    Types: Cigarettes    Quit date: 01/24/1990  . Smokeless tobacco: Not on file     Comment: quit in early 90's  . Alcohol Use: No  . Drug Use: No  . Sexual Activity: Not on file   Other Topics Concern  . Not on file   Social History Narrative   Married an lives with wife in [place]. Retired form [employer].     Current Outpatient Prescriptions  Medication Sig Dispense Refill  . acetaminophen (TYLENOL) 325 MG tablet Take 650 mg by mouth daily as needed for mild pain or headache.     Marland Kitchen aspirin EC 81 MG tablet Take 1 tablet (81 mg total) by mouth daily. 90 tablet 3  . atorvastatin (LIPITOR) 80 MG tablet TAKE ONE TABLET BY MOUTH ONCE DAILY 90 tablet 3  . clopidogrel (PLAVIX) 75 MG tablet TAKE ONE TABLET BY MOUTH ONCE DAILY 30 tablet 6  .  isosorbide dinitrate (ISORDIL) 10 MG tablet Take 2 tablets (20 mg total) by mouth as directed. TAKE 2 TABS IN THE MORNING AND 1 TAB IN THE PM 270 tablet 3  . lisinopril (PRINIVIL,ZESTRIL) 20 MG tablet TAKE ONE TABLET BY MOUTH ONCE DAILY 90 tablet 3  . metoprolol tartrate (LOPRESSOR) 25 MG tablet Take 0.5 tablets (12.5 mg total) by mouth at bedtime.    . Multiple Vitamins-Minerals (PRESERVISION AREDS PO) Take 1 tablet by mouth 2 (two) times daily.    . nitroGLYCERIN (NITROSTAT) 0.4 MG SL tablet Place 0.4 mg under the tongue every 5 (five) minutes as needed for chest pain.    . pantoprazole (PROTONIX) 40 MG tablet Take 1 tablet (40 mg total) by mouth daily. 90 tablet 3  . tamsulosin (FLOMAX) 0.4 MG CAPS capsule Take 1 capsule (0.4 mg  total) by mouth daily. 30 capsule 3   No current facility-administered medications for this visit.    No Known Allergies    Review of Systems:  General:normal appetite, decreased energy, no weight gain, no weight loss, no fever Cardiac:has chest pain with exertion, no chest pain at rest, moderate SOB with mild exertion, no resting SOB, no PND, no orthopnea, no palpitations, no arrhythmia, no atrial fibrillation, mild LE edema, no dizzy spells, no syncope Respiratory:exertional shortness of breath, no home oxygen, no productive cough, has dry cough, no bronchitis, no wheezing, no hemoptysis, no asthma, no pain with inspiration or cough, no sleep apnea, no CPAP at night GI:no difficulty swallowing, no reflux, no frequent heartburn, no hiatal hernia, no abdominal pain, no constipation,has diarrhea, no hematochezia, no hematemesis, no melena GU:no dysuria, no frequency, no urinary tract infection, no hematuria, no enlarged prostate, no kidney stones, no  kidney disease Vascular:had pain suggestive of claudication prior to iliac stents, no pain in feet, no leg cramps, no varicose veins, no DVT, no non-healing foot ulcer Neuro:no stroke, no TIA's, no seizures, no headaches, no temporary blindness one eye, no slurred speech, no peripheral neuropathy, no chronic pain, no instability of gait, no memory/cognitive dysfunction Musculoskeletal:no arthritis, no joint swelling, no myalgias, no difficulty walking, normal mobility  Skin:no rash, no itching, no skin infections, no pressure sores or ulcerations Psych:no anxiety, no depression, no nervousness, no unusual recent stress Eyes:no blurry vision, no floaters, no recent vision changes, wears glasses or contacts ENT:has hearing loss, has loose or painful teeth, upper dentures, last saw dentist many years ago Hematologic:no easy bruising, no abnormal bleeding, no clotting disorder, no frequent epistaxis Endocrine:no diabetes, does not check CBG's at home    Physical Exam:  BP 130/62 mmHg  Pulse 67  Resp 19  Ht 5\' 9"  (1.753 m)  Wt 192 lb (87.091 kg)  BMI 28.34 kg/m2  SpO2 95% General:Elderly but well-appearing HEENT:Unremarkable, NCAT, PERLA, EOMI, 6 teeth remain in the mandible and a couple are loose Neck:no JVD, no bruits, no adenopathy or thyromegaly Chest:clear to auscultation, symmetrical breath sounds, no wheezes, no rhonchi  CV:RRR, grade III/VI crescendo/decrescendo murmur heard  best at RSB, no diastolic murmur Abdomen:soft, non-tender, no masses or organomegaly Extremities:warm, well-perfused, pulses palpable in feet, no LE edema Rectal/GUDeferred Neuro:Grossly non-focal and symmetrical throughout Skin:Clean and dry, no rashes, no breakdown   Diagnostic Tests:   *[facility]*  [address]  [place], [place] [address]  [phone]  ------------------------------------------------------------------- Transthoracic Echocardiography  Patient: Matthew [name], Matthew [name] MR #: [id number] Study Date: 04/06/2016 Gender: M Age: 8 Height: 175.3 cm Weight: 84.4 kg BSA: 2.04 m^2 Pt. Status: Room:  ATTENDING Letha Cape, Scott T REFERRING Richardson Dopp T PERFORMING [place], [place] SONOGRAPHER Pilar Jarvis, RVT, RDCS, RDMS  cc:  ------------------------------------------------------------------- LV EF: 55% - 60%  ------------------------------------------------------------------- History: PMH: OSA. Coronary artery disease. Angina pectoris. Aortic valve disease. Risk factors: Hypertension. Dyslipidemia.  ------------------------------------------------------------------- Study Conclusions  - Left ventricle: The cavity size was normal. There was mild  concentric hypertrophy. Systolic function was normal. The  estimated ejection fraction was in the range of 55% to 60%. Wall  motion was normal; there were no regional wall motion  abnormalities. Doppler parameters are consistent with abnormal  left ventricular relaxation (grade 1 diastolic dysfunction). - Aortic valve: There was moderate to severe stenosis. There was   mild regurgitation. Mean gradient (S): 28 mm Hg. Peak gradient  (S): 46 mm Hg. Valve area (VTI):  0.94 cm^2. - Left atrium: The atrium was mildly dilated. - Pulmonary arteries: Systolic pressure was within the normal  range.  Impressions:  - Relatively stable aortic stenosis.  Transthoracic echocardiography. M-mode, complete 2D, spectral Doppler, and color Doppler. Birthdate: Patient birthdate: 1936-03-27. Age: Patient is 80 yr old. Sex: Gender: male. BMI: 27.5 kg/m^2. Blood pressure: 136/62 Patient status: Outpatient. Study date: Study date: 04/06/2016. Study time: 03:41 PM.  -------------------------------------------------------------------  ------------------------------------------------------------------- Left ventricle: The cavity size was normal. There was mild concentric hypertrophy. Systolic function was normal. The estimated ejection fraction was in the range of 55% to 60%. Wall motion was normal; there were no regional wall motion abnormalities. Doppler parameters are consistent with abnormal left ventricular relaxation (grade 1 diastolic dysfunction).  ------------------------------------------------------------------- Aortic valve: Trileaflet; mildly thickened, moderately calcified leaflets. Mobility was not restricted. Doppler: There was moderate to severe stenosis. There was mild regurgitation. VTI ratio of LVOT to aortic valve: 0.25. Valve area (VTI): 0.94 cm^2. Indexed valve area (VTI): 0.46 cm^2/m^2. Peak velocity ratio of LVOT to aortic valve: 0.26. Valve area (Vmax): 0.89 cm^2. Indexed valve area (Vmax): 0.44 cm^2/m^2. Mean velocity ratio of LVOT to aortic valve: 0.24. Valve area (Vmean): 0.84 cm^2. Indexed valve area (Vmean): 0.41 cm^2/m^2. Mean gradient (S): 28 mm Hg. Peak gradient (S): 46 mm Hg.  ------------------------------------------------------------------- Aorta: Aortic root: The aortic root was normal in  size.  ------------------------------------------------------------------- Mitral valve: Structurally normal valve. Mobility was not restricted. Doppler: Transvalvular velocity was within the normal range. There was no evidence for stenosis. There was no regurgitation. Peak gradient (D): 2 mm Hg.  ------------------------------------------------------------------- Left atrium: The atrium was mildly dilated.  ------------------------------------------------------------------- Right ventricle: The cavity size was normal. Wall thickness was normal. Systolic function was normal.  ------------------------------------------------------------------- Pulmonic valve: Doppler: Transvalvular velocity was within the normal range. There was no evidence for stenosis. There was trivial regurgitation.  ------------------------------------------------------------------- Tricuspid valve: Structurally normal valve. Doppler: Transvalvular velocity was within the normal range. There was trivial regurgitation.  ------------------------------------------------------------------- Pulmonary artery: The main pulmonary artery was normal-sized. Systolic pressure was within the normal range.  ------------------------------------------------------------------- Right atrium: The atrium was normal in size.  ------------------------------------------------------------------- Pericardium: There was no pericardial effusion.  ------------------------------------------------------------------- Systemic veins: Inferior vena cava: The vessel was normal in size.  ------------------------------------------------------------------- Measurements  Left ventricle Value Reference LV ID, ED, PLAX chordal 47.6 mm 43 - 52 LV ID, ES, PLAX chordal 35.4 mm 23 - 38 LV fx shortening, PLAX chordal  (L) 26 % >=29 LV PW thickness, ED 12.3 mm --------- IVS/LV PW ratio, ED 1.14 <=1.3 Stroke volume, 2D 72 ml --------- Stroke volume/bsa, 2D 35 ml/m^2 --------- LV e&', lateral 8.05 cm/s --------- LV E/e&', lateral 9.04 --------- LV e&', medial 5.66 cm/s --------- LV E/e&', medial 12.86 --------- LV e&', average 6.86 cm/s --------- LV E/e&', average 10.62 ---------  Ventricular septum Value Reference IVS thickness, ED 14 mm ---------  LVOT Value Reference LVOT ID, S 21 mm --------- LVOT area 3.46 cm^2 --------- LVOT ID 21 mm --------- LVOT peak velocity, S 87.2 cm/s --------- LVOT mean velocity, S 53.9 cm/s --------- LVOT VTI, S 20.8 cm --------- Stroke volume (SV), LVOT DP 72 ml --------- Stroke index (SV/bsa), LVOT DP 35.2 ml/m^2 ---------  Aortic valve Value Reference Aortic valve peak velocity, S 339 cm/s --------- Aortic valve mean velocity, S 221 cm/s --------- Aortic valve VTI, S 84.5 cm --------- Aortic mean gradient, S 28 mm Hg --------- Aortic peak gradient, S 46 mm Hg --------- VTI ratio, LVOT/AV  0.25 --------- Aortic  valve area, VTI 0.94 cm^2 --------- Aortic valve area/bsa, VTI 0.46 cm^2/m^2 --------- Velocity ratio, peak, LVOT/AV 0.26 --------- Aortic valve area, peak velocity 0.89 cm^2 --------- Aortic valve area/bsa, peak 0.44 cm^2/m^2 --------- velocity Velocity ratio, mean, LVOT/AV 0.24 --------- Aortic valve area, mean velocity 0.84 cm^2 --------- Aortic valve area/bsa, mean 0.41 cm^2/m^2 --------- velocity  Aorta Value Reference Aortic root ID, ED 28 mm ---------  Left atrium Value Reference LA ID, A-P, ES 46 mm --------- LA ID/bsa, A-P (H) 2.25 cm/m^2 <=2.2 LA volume, S 74.8 ml --------- LA volume/bsa, S 36.6 ml/m^2 --------- LA volume, ES, 1-p A4C 59.5 ml --------- LA volume/bsa, ES, 1-p A4C 29.1 ml/m^2 --------- LA volume, ES, 1-p A2C 86.7 ml --------- LA volume/bsa, ES, 1-p A2C 42.4 ml/m^2 ---------  Mitral valve Value Reference Mitral E-wave peak velocity 72.8 cm/s --------- Mitral A-wave peak velocity 99.9 cm/s --------- Mitral deceleration time 155 ms 150 - 230 Mitral peak gradient, D 2 mm Hg --------- Mitral E/A ratio, peak 0.7 ---------  Right ventricle Value Reference TAPSE 16.8 mm ---------  Pulmonic  valve Value Reference Pulmonic valve peak velocity, S 73.2 cm/s --------- Pulmonic regurg velocity, ED 96.4 cm/s ---------  Legend: (L) and (H) mark values outside specified reference range.  ------------------------------------------------------------------- Prepared and Electronically Authenticated by  [name], MD 2017-04-26T08:24:51  ADDENDUM REPORT: 05/07/2016 12:41 CLINICAL DATA: Aortic stenosis EXAM: Cardiac TAVR CT TECHNIQUE: The patient was scanned on a Philips 256 scanner. A 120 kV retrospective scan was triggered in the descending thoracic aorta at 111 HU's. Gantry rotation speed was 270 msecs and collimation was .9 mm. No beta blockade or nitro were given. The 3D data set was reconstructed in 5% intervals of the R-R cycle. Systolic and diastolic phases were analyzed on a dedicated work station using MPR, MIP and VRT modes. The patient received 80 cc of contrast. FINDINGS: Aortic Valve: Tri-leaflet and moderately calcified. Aorta: Moderate calcification of arch with normal origin of great vessels Previous CABG with SVG markers and LIMA Sinotubular Junction: 28 mm Ascending Thoracic Aorta: 33 mm Aortic Arch: 28 mm Descending Thoracic Aorta: 27 mm Sinus of Valsalva Measurements: Non-coronary: 33 mm Right -coronary: 34 mm Left -coronary: 37 mm Coronary Artery Height above Annulus: Left Main: 15 mm Right Coronary: 21 mm Virtual Basal Annulus Measurements: Maximum/Minimum Diameter: 22.7 mm x 29.4 mm Perimeter: 84 mm Area: 532 mm2 Coronary Arteries: Sufficient height above annulus for deployment Optimum Fluoroscopic Angle for Delivery: AP IMPRESSION: 1) Calcified trileaflet aortic valve with annulus 532 mm2 suitable for a 29 mm Sapien 3 valve 2) Sufficient coronary height for deployment 3) Optimum angiographic angle for deployment straight AP Jenkins Rouge Electronically Signed  By: Jenkins Rouge M.D.  On: 05/07/2016 12:41     Study Result     EXAM: OVER-READ INTERPRETATION CT CHEST  The following report is an over-read performed by radiologist Dr. Rebekah Chesterfield [facility] on 05/07/2016. This over-read does not include interpretation of cardiac or coronary anatomy or pathology. The coronary calcium score/coronary CTA interpretation by the cardiologist is attached.  COMPARISON: No priors.  FINDINGS: Extracardiac findings will be described separately on dictation for contemporaneously obtained CTA of the chest, abdomen and pelvis.  IMPRESSION: Please see separate dictation for contemporaneously obtained CTA of the chest, abdomen and pelvis 05/07/2016 for full description of extracardiac findings.  Electronically Signed: By: Vinnie Langton M.D. On: 05/07/2016 10:57   CLINICAL DATA: 80 year old male with history of severe aortic stenosis. Preprocedural study prior to potential transcatheter aortic valve  replacement (TAVR) procedure.  EXAM: CT ANGIOGRAPHY CHEST, ABDOMEN AND PELVIS  TECHNIQUE: Multidetector CT imaging through the chest, abdomen and pelvis was performed using the standard protocol during bolus administration of intravenous contrast. Multiplanar reconstructed images and MIPs were obtained and reviewed to evaluate the vascular anatomy.  CONTRAST: 75 mL of Isovue 370  COMPARISON: CT the abdomen and pelvis 06/28/2013.  FINDINGS: CTA CHEST FINDINGS  Mediastinum/Lymph Nodes: Heart size is mildly enlarged. There is no significant pericardial fluid, thickening or pericardial calcification. There is atherosclerosis of the thoracic aorta, the great vessels of the mediastinum and the coronary arteries, including calcified atherosclerotic plaque in the left main, left anterior descending, left circumflex and right coronary arteries. Status post median  sternotomy for CABG, including LIMA to the LAD. Severe thickening and calcifications of the aortic valve. Some borderline enlarged lymph nodes are noted in the hilar regions bilaterally, nonspecific and favored to be reactive. No pathologically enlarged mediastinal lymph nodes. Esophagus is normal in appearance. No axillary lymphadenopathy.  Lungs/Pleura: Lung volumes appear low and there are some dependent areas of ground-glass attenuation in the lower lobes of the lungs bilaterally, favored to reflect areas of developing subsegmental atelectasis. No acute consolidative airspace disease. No pleural effusions. No suspicious appearing pulmonary nodules or masses.  Musculoskeletal/Soft Tissues: Median sternotomy wires. There are no aggressive appearing lytic or blastic lesions noted in the visualized portions of the skeleton.  CTA ABDOMEN AND PELVIS FINDINGS  Hepatobiliary: No cystic or solid hepatic lesions. No intra or extrahepatic biliary ductal dilatation. Status post cholecystectomy.  Pancreas: No pancreatic mass. No pancreatic ductal dilatation. No pancreatic or peripancreatic fluid or inflammatory changes.  Spleen: Unremarkable.  Adrenals/Urinary Tract: Mild diffuse cortical atrophy in both kidneys. Multiple sub cm low-attenuation lesions in the kidneys bilaterally, too small to characterize, but statistically likely cysts. Exophytic 2 cm simple cyst in the lateral aspect of the interpolar region of the left kidney. No hydroureteronephrosis. Urinary bladder is unremarkable in appearance. Bilateral adrenal glands are normal in appearance.  Stomach/Bowel: Normal appearance of the stomach. No pathologic dilatation of small bowel or colon. Several colonic diverticulae are noted, without surrounding inflammatory changes to suggest an acute diverticulitis at this time. Normal appendix.  Vascular/Lymphatic: Extensive atherosclerosis throughout the abdominal and pelvic  vasculature, with vascular findings and measurements pertinent to potential TAVR procedure, as detailed below. In addition, there is mild aneurysmal dilatation of the right common iliac artery which measures up to 1.8 cm in diameter. No lymphadenopathy noted in the abdomen or pelvis.  Reproductive: Prostate gland and seminal vesicles are unremarkable in appearance.  Other: No significant volume of ascites. No pneumoperitoneum.  Musculoskeletal: There are no aggressive appearing lytic or blastic lesions noted in the visualized portions of the skeleton.  VASCULAR MEASUREMENTS PERTINENT TO TAVR:  AORTA:  Minimal Aortic Diameter - 12 x 13 mm  Severity of Aortic Calcification - severe  RIGHT PELVIS:  Right Common Iliac Artery -  Minimal Diameter - 6.4 x 6.0 mm  Tortuosity - mild  Calcification - severe  Comment - There is a right common iliac artery stent in place, with the narrowest luminal diameter of 7.8 x 6.9 mm.  Right External Iliac Artery -  Minimal Diameter - 8.2 x 5.2 mm  Tortuosity - moderate  Calcification - mild  Right Common Femoral Artery -  Minimal Diameter - 6.2 x 8.2 mm  Tortuosity - mild  Calcification - mild-to-moderate  LEFT PELVIS:  Left Common Iliac Artery -  Minimal Diameter - 4.7  x 4.3 mm  Tortuosity - mild  Calcification - severe  Comment - There is a left common iliac artery stent in place, with the narrowest luminal diameter of 4.7 x 4.3 mm.  Left External Iliac Artery -  Minimal Diameter - 6.5 x 7.5 mm  Tortuosity - moderate  Calcification - mild  Left Common Femoral Artery -  Minimal Diameter - 8.1 x 6.4 mm  Tortuosity - mild  Calcification - mild-to-moderate  Review of the MIP images confirms the above findings.  IMPRESSION: 1. Vascular findings and measurements pertinent to potential TAVR procedure, as detailed above. This patient appears to have suitable pelvic  arterial access on the right side. However, bilateral common iliac artery stents are present, as discussed above. 2. Thickening calcification of the aortic valve, compatible with the reported clinical history of severe aortic stenosis. 3. Atherosclerosis, including left main and 3 vessel coronary artery disease. Status post median sternotomy for CABG, including LIMA to the LAD. 4. Colonic diverticulosis without evidence of acute diverticulitis at this time. 5. Additional incidental findings, as above.   Electronically Signed  By: Vinnie Langton M.D.  On: 05/07/2016 13:05  Procedure: AV Replacement  Risk of Mortality: 3.892%  Morbidity or Mortality: 22.934%  Long Length of Stay: 7.87%  Short Length of Stay: 27.869%  Permanent Stroke: 2.572%  Prolonged Ventilation: 14.05%  DSW Infection: 0.428%  Renal Failure: 5.055%  Reoperation: 9.025%   Impression:  This patient is a 80 year old with stage D severe, symptomatic aortic stenosis with shortness of breath, fatigue and chest pain with mild exertion ( NYHA class II-III). I have personally reviewed his prior cardiac cath, recent echo and CTA studies. His mean gradient is only 28 mm Hg by echo but the valve is thickened and moderately calcified with restricted mobility. His DI is 0.26 and the valve area is 0.89 cm2. I suspect that he has low gradient, normal EF severe AS. His symptoms are consistent with severe aortic stenosis and repeat cath shows patent grafts and an 80% OM2 native vessel stenosis. He does not require any revascularization prior to treatment of his aortic stenosis.  I think AVR is indicated in this patient with progressive symptoms. I think TAVR is the best option for this patient due to the high risk of SAVR since he has had two prior CABG surgeries.   The patient and his wife were counseled at length regarding treatment alternatives for management of severe symptomatic aortic stenosis. Alternative  approaches such as conventional aortic valve replacement, transcatheter aortic valve replacement, and palliative medical therapy were compared and contrasted at length. The risks associated with conventional surgical aortic valve replacement were been discussed in detail, as were expectations for post-operative convalescence. Long-term prognosis with medical therapy was discussed.  His gated cardiac CT shows anatomy suitable for a Sapien 3 valve. The abdominal and pelvic CT shows adequate pelvic vessels for a right transfemoral approach. He has had bilateral iliac artery stenting and has some narrowing on the left but the right iliac dimensions are large enough to allow a 27F sheath to pass.   We discussed complications that might develop including but not limited to risks of death, stroke, paravalvular leak, aortic dissection or other major vascular complications, aortic annulus rupture, device embolization, cardiac rupture or perforation, mitral regurgitation, acute myocardial infarction, arrhythmia, heart block or bradycardia requiring permanent pacemaker placement, congestive heart failure, respiratory failure, renal failure, pneumonia, infection, other late complications related to structural valve deterioration or migration, or other complications  that might ultimately cause a temporary or permanent loss of functional independence or other long term morbidity. The patient provides full informed consent for the procedure as described and all questions were answered.   He was seen by Dr. Enrique Sack and it was felt that he needed to have the remainder of his teeth removed. Dr. Enrique Sack could not schedule this until this week and therefore the patient was referred to Dr Benson Norway for extraction which was done at the end of last week.   Plan:  Transfemoral TAVR on Tuesday 06/01/2016.    Gaye Pollack, MD

## 2016-05-31 NOTE — Progress Notes (Signed)
Notified Myra Gianotti of Platelet count 95 .

## 2016-06-01 ENCOUNTER — Inpatient Hospital Stay (HOSPITAL_COMMUNITY): Payer: Medicare Other | Admitting: Vascular Surgery

## 2016-06-01 ENCOUNTER — Inpatient Hospital Stay (HOSPITAL_COMMUNITY)
Admission: RE | Admit: 2016-06-01 | Discharge: 2016-06-03 | DRG: 267 | Disposition: A | Discharge status: Home / Self Care | Payer: Medicare Other | Source: Ambulatory Visit | Attending: Cardiovascular Disease | Admitting: Cardiovascular Disease

## 2016-06-01 ENCOUNTER — Other Ambulatory Visit: Payer: Self-pay

## 2016-06-01 ENCOUNTER — Encounter (HOSPITAL_COMMUNITY): Payer: Self-pay | Admitting: *Deleted

## 2016-06-01 ENCOUNTER — Encounter (HOSPITAL_COMMUNITY)
Admission: RE | Disposition: A | Discharge status: Home / Self Care | Payer: Self-pay | Source: Ambulatory Visit | Attending: Cardiovascular Disease

## 2016-06-01 ENCOUNTER — Inpatient Hospital Stay (HOSPITAL_COMMUNITY): Payer: Medicare Other

## 2016-06-01 DIAGNOSIS — Z87891 Personal history of nicotine dependence: Secondary | ICD-10-CM

## 2016-06-01 DIAGNOSIS — I350 Nonrheumatic aortic (valve) stenosis: Secondary | ICD-10-CM | POA: Diagnosis present

## 2016-06-01 DIAGNOSIS — Y92230 Patient room in hospital as the place of occurrence of the external cause: Secondary | ICD-10-CM | POA: Diagnosis not present

## 2016-06-01 DIAGNOSIS — Z7982 Long term (current) use of aspirin: Secondary | ICD-10-CM

## 2016-06-01 DIAGNOSIS — I25810 Atherosclerosis of coronary artery bypass graft(s) without angina pectoris: Secondary | ICD-10-CM | POA: Diagnosis present

## 2016-06-01 DIAGNOSIS — I252 Old myocardial infarction: Secondary | ICD-10-CM | POA: Diagnosis not present

## 2016-06-01 DIAGNOSIS — T45515A Adverse effect of anticoagulants, initial encounter: Secondary | ICD-10-CM | POA: Diagnosis not present

## 2016-06-01 DIAGNOSIS — Z7902 Long term (current) use of antithrombotics/antiplatelets: Secondary | ICD-10-CM

## 2016-06-01 DIAGNOSIS — I739 Peripheral vascular disease, unspecified: Secondary | ICD-10-CM | POA: Diagnosis present

## 2016-06-01 DIAGNOSIS — I708 Atherosclerosis of other arteries: Secondary | ICD-10-CM | POA: Diagnosis present

## 2016-06-01 DIAGNOSIS — R001 Bradycardia, unspecified: Secondary | ICD-10-CM | POA: Diagnosis not present

## 2016-06-01 DIAGNOSIS — K219 Gastro-esophageal reflux disease without esophagitis: Secondary | ICD-10-CM | POA: Diagnosis present

## 2016-06-01 DIAGNOSIS — I454 Nonspecific intraventricular block: Secondary | ICD-10-CM | POA: Diagnosis not present

## 2016-06-01 DIAGNOSIS — I10 Essential (primary) hypertension: Secondary | ICD-10-CM | POA: Diagnosis present

## 2016-06-01 DIAGNOSIS — Z951 Presence of aortocoronary bypass graft: Secondary | ICD-10-CM

## 2016-06-01 DIAGNOSIS — Z955 Presence of coronary angioplasty implant and graft: Secondary | ICD-10-CM

## 2016-06-01 DIAGNOSIS — H9190 Unspecified hearing loss, unspecified ear: Secondary | ICD-10-CM | POA: Diagnosis present

## 2016-06-01 DIAGNOSIS — E785 Hyperlipidemia, unspecified: Secondary | ICD-10-CM | POA: Diagnosis present

## 2016-06-01 DIAGNOSIS — Z8249 Family history of ischemic heart disease and other diseases of the circulatory system: Secondary | ICD-10-CM | POA: Diagnosis not present

## 2016-06-01 DIAGNOSIS — G4733 Obstructive sleep apnea (adult) (pediatric): Secondary | ICD-10-CM | POA: Diagnosis present

## 2016-06-01 DIAGNOSIS — D6959 Other secondary thrombocytopenia: Secondary | ICD-10-CM | POA: Diagnosis not present

## 2016-06-01 DIAGNOSIS — Z006 Encounter for examination for normal comparison and control in clinical research program: Secondary | ICD-10-CM

## 2016-06-01 DIAGNOSIS — Z952 Presence of prosthetic heart valve: Secondary | ICD-10-CM

## 2016-06-01 DIAGNOSIS — D696 Thrombocytopenia, unspecified: Secondary | ICD-10-CM | POA: Diagnosis present

## 2016-06-01 DIAGNOSIS — I2510 Atherosclerotic heart disease of native coronary artery without angina pectoris: Secondary | ICD-10-CM | POA: Diagnosis present

## 2016-06-01 HISTORY — PX: TRANSCATHETER AORTIC VALVE REPLACEMENT, TRANSFEMORAL: SHX6400

## 2016-06-01 HISTORY — PX: TEE WITHOUT CARDIOVERSION: SHX5443

## 2016-06-01 LAB — POCT I-STAT, CHEM 8
BUN: 6 mg/dL (ref 6–20)
BUN: 6 mg/dL (ref 6–20)
BUN: 6 mg/dL (ref 6–20)
BUN: <3 mg/dL — ABNORMAL LOW (ref 6–20)
CALCIUM ION: 0.85 mmol/L — AB (ref 1.13–1.30)
CHLORIDE: 104 mmol/L (ref 101–111)
CHLORIDE: 117 mmol/L — AB (ref 101–111)
CREATININE: 0.7 mg/dL (ref 0.61–1.24)
CREATININE: 0.3 mg/dL — AB (ref 0.61–1.24)
CREATININE: 0.7 mg/dL (ref 0.61–1.24)
CREATININE: 0.8 mg/dL (ref 0.61–1.24)
Calcium, Ion: 1.23 mmol/L (ref 1.13–1.30)
Calcium, Ion: 1.21 mmol/L (ref 1.13–1.30)
Calcium, Ion: 1.23 mmol/L (ref 1.13–1.30)
Chloride: 103 mmol/L (ref 101–111)
Chloride: 104 mmol/L (ref 101–111)
GLUCOSE: 103 mg/dL — AB (ref 65–99)
Glucose, Bld: 88 mg/dL (ref 65–99)
Glucose, Bld: 60 mg/dL — ABNORMAL LOW (ref 65–99)
Glucose, Bld: 95 mg/dL (ref 65–99)
HCT: 30 % — ABNORMAL LOW (ref 39.0–52.0)
HEMATOCRIT: 33 % — AB (ref 39.0–52.0)
HEMATOCRIT: 17 % — AB (ref 39.0–52.0)
HEMATOCRIT: 29 % — AB (ref 39.0–52.0)
Hemoglobin: 11.2 g/dL — ABNORMAL LOW (ref 13.0–17.0)
Hemoglobin: 10.2 g/dL — ABNORMAL LOW (ref 13.0–17.0)
Hemoglobin: 5.8 g/dL — CL (ref 13.0–17.0)
Hemoglobin: 9.9 g/dL — ABNORMAL LOW (ref 13.0–17.0)
POTASSIUM: 3.5 mmol/L (ref 3.5–5.1)
POTASSIUM: 2 mmol/L — AB (ref 3.5–5.1)
POTASSIUM: 3.5 mmol/L (ref 3.5–5.1)
POTASSIUM: 3.6 mmol/L (ref 3.5–5.1)
SODIUM: 141 mmol/L (ref 135–145)
Sodium: 141 mmol/L (ref 135–145)
Sodium: 141 mmol/L (ref 135–145)
Sodium: 148 mmol/L — ABNORMAL HIGH (ref 135–145)
TCO2: 25 mmol/L (ref 0–100)
TCO2: 16 mmol/L (ref 0–100)
TCO2: 24 mmol/L (ref 0–100)
TCO2: 24 mmol/L (ref 0–100)

## 2016-06-01 LAB — CBC
HCT: 34.3 % — ABNORMAL LOW (ref 39.0–52.0)
HEMOGLOBIN: 11 g/dL — AB (ref 13.0–17.0)
MCH: 28.9 pg (ref 26.0–34.0)
MCHC: 32.1 g/dL (ref 30.0–36.0)
MCV: 90 fL (ref 78.0–100.0)
Platelets: 80 10*3/uL — ABNORMAL LOW (ref 150–400)
RBC: 3.81 MIL/uL — AB (ref 4.22–5.81)
RDW: 13 % (ref 11.5–15.5)
WBC: 4.6 10*3/uL (ref 4.0–10.5)

## 2016-06-01 LAB — ECHO TEE
AO mean calculated velocity dopler: 110 cm/s
AOPV: 0.52 m/s
AOVTI: 45.5 cm
AV Area mean vel: 2.06 cm2
AV Mean grad: 6 mmHg
AV peak Index: 0.96
AV pk vel: 166 cm/s
AV vel: 1.94
AVA: 1.94 cm2
AVAREAMEANVIN: 0.99 cm2/m2
AVAREAVTI: 1.99 cm2
AVAREAVTIIND: 0.94 cm2/m2
AVCELMEANRAT: 0.54
AVPG: 11 mmHg
CHL CUP AV VALUE AREA INDEX: 0.94
LVOT VTI: 23.2 cm
LVOT area: 3.8 cm2
LVOT diameter: 22 mm
LVOTPV: 86.9 cm/s
LVOTSV: 88 mL
LVOTVTI: 0.51 cm

## 2016-06-01 LAB — PREPARE RBC (CROSSMATCH)

## 2016-06-01 SURGERY — IMPLANTATION, AORTIC VALVE, TRANSCATHETER, FEMORAL APPROACH
Anesthesia: General | Site: Chest

## 2016-06-01 MED ORDER — FENTANYL CITRATE (PF) 100 MCG/2ML IJ SOLN
INTRAMUSCULAR | Status: DC | PRN
  Administered 2016-06-01 (×2): 50 ug via INTRAVENOUS

## 2016-06-01 MED ORDER — HEPARIN SODIUM (PORCINE) 1000 UNIT/ML IJ SOLN
INTRAMUSCULAR | Status: DC | PRN
  Administered 2016-06-01: 7000 [IU] via INTRAVENOUS

## 2016-06-01 MED ORDER — TAMSULOSIN HCL 0.4 MG PO CAPS
0.4000 mg | ORAL_CAPSULE | Freq: Every day | ORAL | Status: DC
  Administered 2016-06-02 – 2016-06-03 (×2): 0.4 mg via ORAL
  Filled 2016-06-01 (×2): qty 1

## 2016-06-01 MED ORDER — NOREPINEPHRINE BITARTRATE 1 MG/ML IV SOLN
4000.0000 ug | INTRAVENOUS | Status: DC | PRN
  Administered 2016-06-01: 2 ug/min via INTRAVENOUS

## 2016-06-01 MED ORDER — ACETAMINOPHEN 160 MG/5ML PO SOLN
650.0000 mg | Freq: Once | ORAL | Status: AC
  Administered 2016-06-01: 650 mg
  Filled 2016-06-01: qty 20.3

## 2016-06-01 MED ORDER — METOPROLOL TARTRATE 12.5 MG HALF TABLET: 12.5000 mg | ORAL_TABLET | Freq: Two times a day (BID) | ORAL | Status: DC

## 2016-06-01 MED ORDER — ASPIRIN EC 81 MG PO TBEC: 81.0000 mg | DELAYED_RELEASE_TABLET | Freq: Every day | ORAL | Status: DC

## 2016-06-01 MED ORDER — CETYLPYRIDINIUM CHLORIDE 0.05 % MT LIQD
7.0000 mL | Freq: Two times a day (BID) | OROMUCOSAL | Status: DC
  Administered 2016-06-01 – 2016-06-02 (×3): 7 mL via OROMUCOSAL

## 2016-06-01 MED ORDER — REMIFENTANIL HCL 1 MG IV SOLR
INTRAVENOUS | Status: DC | PRN
  Administered 2016-06-01: .4 ug/kg/min via INTRAVENOUS

## 2016-06-01 MED ORDER — MIDAZOLAM HCL 2 MG/2ML IJ SOLN
INTRAMUSCULAR | Status: AC
  Administered 2016-06-01: 1 mg
  Filled 2016-06-01: qty 2

## 2016-06-01 MED ORDER — CHLORHEXIDINE GLUCONATE 4 % EX LIQD: 30.0000 mL | CUTANEOUS | Status: DC

## 2016-06-01 MED ORDER — CHLORHEXIDINE GLUCONATE 0.12 % MT SOLN
15.0000 mL | OROMUCOSAL | Status: AC
  Administered 2016-06-01: 15 mL via OROMUCOSAL

## 2016-06-01 MED ORDER — ONDANSETRON HCL 4 MG/2ML IJ SOLN
INTRAMUSCULAR | Status: DC | PRN
Start: 2016-06-01 — End: 2016-06-01
  Administered 2016-06-01: 4 mg via INTRAVENOUS

## 2016-06-01 MED ORDER — PROTAMINE SULFATE 10 MG/ML IV SOLN
INTRAVENOUS | Status: DC | PRN
  Administered 2016-06-01: 20 mg via INTRAVENOUS
  Administered 2016-06-01: 10 mg via INTRAVENOUS
  Administered 2016-06-01 (×2): 20 mg via INTRAVENOUS

## 2016-06-01 MED ORDER — SODIUM CHLORIDE 0.9 % IV SOLN
0.0125 ug/kg/min | INTRAVENOUS | Status: DC
  Filled 2016-06-01: qty 2000

## 2016-06-01 MED ORDER — ALBUMIN HUMAN 5 % IV SOLN: 250.0000 mL | INTRAVENOUS | Status: DC | PRN

## 2016-06-01 MED ORDER — ROCURONIUM BROMIDE 100 MG/10ML IV SOLN
INTRAVENOUS | Status: DC | PRN
  Administered 2016-06-01: 10 mg via INTRAVENOUS
  Administered 2016-06-01: 40 mg via INTRAVENOUS

## 2016-06-01 MED ORDER — FAMOTIDINE IN NACL 20-0.9 MG/50ML-% IV SOLN
20.0000 mg | Freq: Two times a day (BID) | INTRAVENOUS | Status: DC
  Filled 2016-06-01: qty 50

## 2016-06-01 MED ORDER — LACTATED RINGERS IV SOLN: 500.0000 mL | Freq: Once | INTRAVENOUS | Status: DC | PRN

## 2016-06-01 MED ORDER — ACETAMINOPHEN 160 MG/5ML PO SOLN: 1000.0000 mg | Freq: Four times a day (QID) | ORAL | Status: DC

## 2016-06-01 MED ORDER — MORPHINE SULFATE (PF) 2 MG/ML IV SOLN: 2.0000 mg | INTRAVENOUS | Status: DC | PRN

## 2016-06-01 MED ORDER — FENTANYL CITRATE (PF) 250 MCG/5ML IJ SOLN
INTRAMUSCULAR | Status: AC
  Filled 2016-06-01: qty 5

## 2016-06-01 MED ORDER — PROPOFOL 10 MG/ML IV BOLUS
INTRAVENOUS | Status: DC | PRN
  Administered 2016-06-01: 40 mg via INTRAVENOUS
  Administered 2016-06-01: 50 mg via INTRAVENOUS

## 2016-06-01 MED ORDER — CHLORHEXIDINE GLUCONATE 4 % EX LIQD: 60.0000 mL | Freq: Once | CUTANEOUS | Status: DC

## 2016-06-01 MED ORDER — INSULIN REGULAR BOLUS VIA INFUSION
0.0000 [IU] | Freq: Three times a day (TID) | INTRAVENOUS | Status: DC
  Filled 2016-06-01 (×2): qty 10

## 2016-06-01 MED ORDER — DEXTROSE 5 % IV SOLN
20.0000 mg | INTRAVENOUS | Status: DC | PRN
  Administered 2016-06-01: 15 ug/min via INTRAVENOUS

## 2016-06-01 MED ORDER — NITROGLYCERIN IN D5W 200-5 MCG/ML-% IV SOLN: 0.0000 ug/min | INTRAVENOUS | Status: DC

## 2016-06-01 MED ORDER — IODIXANOL 320 MG/ML IV SOLN
INTRAVENOUS | Status: DC | PRN
  Administered 2016-06-01: 61.2 mL via INTRAVENOUS

## 2016-06-01 MED ORDER — ACETAMINOPHEN 650 MG RE SUPP
650.0000 mg | Freq: Once | RECTAL | Status: AC
  Filled 2016-06-01: qty 1

## 2016-06-01 MED ORDER — 0.9 % SODIUM CHLORIDE (POUR BTL) OPTIME
TOPICAL | Status: DC | PRN
  Administered 2016-06-01: 4000 mL

## 2016-06-01 MED ORDER — SODIUM CHLORIDE 0.9% FLUSH: 3.0000 mL | INTRAVENOUS | Status: DC | PRN

## 2016-06-01 MED ORDER — GLYCOPYRROLATE 0.2 MG/ML IJ SOLN
INTRAMUSCULAR | Status: DC | PRN
  Administered 2016-06-01 (×2): 0.1 mg via INTRAVENOUS

## 2016-06-01 MED ORDER — TRAMADOL HCL 50 MG PO TABS: 50.0000 mg | ORAL_TABLET | ORAL | Status: DC | PRN

## 2016-06-01 MED ORDER — LIDOCAINE HCL (CARDIAC) 20 MG/ML IV SOLN
INTRAVENOUS | Status: DC | PRN
  Administered 2016-06-01: 100 mg via INTRAVENOUS

## 2016-06-01 MED ORDER — OXYCODONE HCL 5 MG PO TABS: 5.0000 mg | ORAL_TABLET | ORAL | Status: DC | PRN

## 2016-06-01 MED ORDER — SODIUM CHLORIDE 0.9 % IV SOLN
INTRAVENOUS | Status: DC | PRN
  Administered 2016-06-01 (×3): 500 mL

## 2016-06-01 MED ORDER — DEXTROSE 5 % IV SOLN
0.0000 ug/min | INTRAVENOUS | Status: DC
  Filled 2016-06-01: qty 2

## 2016-06-01 MED ORDER — SODIUM CHLORIDE 0.9 % IV SOLN: 1.0000 mL/kg/h | INTRAVENOUS | Status: DC

## 2016-06-01 MED ORDER — METOPROLOL TARTRATE 5 MG/5ML IV SOLN: 2.5000 mg | INTRAVENOUS | Status: DC | PRN

## 2016-06-01 MED ORDER — SODIUM CHLORIDE 0.9% FLUSH
3.0000 mL | Freq: Two times a day (BID) | INTRAVENOUS | Status: DC
  Administered 2016-06-01: 3 mL via INTRAVENOUS

## 2016-06-01 MED ORDER — ATORVASTATIN CALCIUM 80 MG PO TABS
80.0000 mg | ORAL_TABLET | Freq: Every day | ORAL | Status: DC
  Administered 2016-06-01 – 2016-06-03 (×3): 80 mg via ORAL
  Filled 2016-06-01 (×3): qty 1

## 2016-06-01 MED ORDER — ONDANSETRON HCL 4 MG/2ML IJ SOLN: 4.0000 mg | Freq: Four times a day (QID) | INTRAMUSCULAR | Status: DC | PRN

## 2016-06-01 MED ORDER — METOPROLOL TARTRATE 25 MG/10 ML ORAL SUSPENSION: 12.5000 mg | Freq: Two times a day (BID) | ORAL | Status: DC

## 2016-06-01 MED ORDER — POTASSIUM CHLORIDE CRYS ER 20 MEQ PO TBCR
20.0000 meq | EXTENDED_RELEASE_TABLET | Freq: Once | ORAL | Status: AC
  Administered 2016-06-01: 20 meq via ORAL
  Filled 2016-06-01: qty 1

## 2016-06-01 MED ORDER — VANCOMYCIN HCL IN DEXTROSE 1-5 GM/200ML-% IV SOLN
1000.0000 mg | Freq: Once | INTRAVENOUS | Status: AC
  Administered 2016-06-02: 1000 mg via INTRAVENOUS
  Filled 2016-06-01: qty 200

## 2016-06-01 MED ORDER — SODIUM CHLORIDE 0.9 % IV SOLN
INTRAVENOUS | Status: DC
  Filled 2016-06-01: qty 2.5

## 2016-06-01 MED ORDER — SUGAMMADEX SODIUM 200 MG/2ML IV SOLN
INTRAVENOUS | Status: DC | PRN
  Administered 2016-06-01: 150 mg via INTRAVENOUS
  Administered 2016-06-01: 50 mg via INTRAVENOUS

## 2016-06-01 MED ORDER — DEXTROSE 5 % IV SOLN
1.5000 g | Freq: Two times a day (BID) | INTRAVENOUS | Status: DC
  Administered 2016-06-02 (×2): 1.5 g via INTRAVENOUS
  Filled 2016-06-01 (×5): qty 1.5

## 2016-06-01 MED ORDER — DEXMEDETOMIDINE HCL IN NACL 200 MCG/50ML IV SOLN
0.1000 ug/kg/h | INTRAVENOUS | Status: DC
  Filled 2016-06-01: qty 50

## 2016-06-01 MED ORDER — CLOPIDOGREL BISULFATE 75 MG PO TABS
75.0000 mg | ORAL_TABLET | Freq: Every day | ORAL | Status: DC
  Administered 2016-06-02 – 2016-06-03 (×2): 75 mg via ORAL
  Filled 2016-06-01 (×2): qty 1

## 2016-06-01 MED ORDER — PANTOPRAZOLE SODIUM 40 MG PO TBEC
40.0000 mg | DELAYED_RELEASE_TABLET | Freq: Every day | ORAL | Status: DC
  Administered 2016-06-02: 40 mg via ORAL
  Filled 2016-06-01: qty 1

## 2016-06-01 MED ORDER — FENTANYL CITRATE (PF) 100 MCG/2ML IJ SOLN
INTRAMUSCULAR | Status: AC
  Administered 2016-06-01: 50 ug
  Filled 2016-06-01: qty 2

## 2016-06-01 MED ORDER — ASPIRIN 81 MG PO CHEW: 324.0000 mg | CHEWABLE_TABLET | Freq: Every day | ORAL | Status: DC

## 2016-06-01 MED ORDER — ASPIRIN EC 325 MG PO TBEC
325.0000 mg | DELAYED_RELEASE_TABLET | Freq: Every day | ORAL | Status: DC
  Administered 2016-06-02: 325 mg via ORAL
  Filled 2016-06-01: qty 1

## 2016-06-01 MED ORDER — LACTATED RINGERS IV SOLN
INTRAVENOUS | Status: DC
  Administered 2016-06-01 (×3): via INTRAVENOUS

## 2016-06-01 MED ORDER — ACETAMINOPHEN 500 MG PO TABS
1000.0000 mg | ORAL_TABLET | Freq: Four times a day (QID) | ORAL | Status: DC
  Administered 2016-06-01 – 2016-06-02 (×3): 1000 mg via ORAL
  Filled 2016-06-01 (×4): qty 2

## 2016-06-01 MED ORDER — ALBUMIN HUMAN 5 % IV SOLN
INTRAVENOUS | Status: DC | PRN
  Administered 2016-06-01: 15:00:00 via INTRAVENOUS

## 2016-06-01 MED ORDER — SODIUM CHLORIDE 0.9 % IV SOLN: 250.0000 mL | INTRAVENOUS | Status: DC | PRN

## 2016-06-01 MED ORDER — MIDAZOLAM HCL 2 MG/2ML IJ SOLN: 2.0000 mg | INTRAMUSCULAR | Status: DC | PRN

## 2016-06-01 MED FILL — Magnesium Sulfate Inj 50%: INTRAMUSCULAR | Qty: 10 | Status: AC

## 2016-06-01 MED FILL — Heparin Sodium (Porcine) Inj 1000 Unit/ML: INTRAMUSCULAR | Qty: 30 | Status: AC

## 2016-06-01 MED FILL — Potassium Chloride Inj 2 mEq/ML: INTRAVENOUS | Qty: 40 | Status: AC

## 2016-06-01 SURGICAL SUPPLY — 113 items
ADAPTER UNIV SWAN GANZ BIP (ADAPTER) ×1 IMPLANT
ADAPTER UNV SWAN GANZ BIP (ADAPTER) ×4
ADH SKN CLS APL DERMABOND .7 (GAUZE/BANDAGES/DRESSINGS) ×1
ADPR CATH UNV NS SG CATH (ADAPTER) ×2
ARTERIAL PRESSURE LINE (MISCELLANEOUS) ×2 IMPLANT
ATTRACTOMAT 16X20 MAGNETIC DRP (DRAPES) IMPLANT
BAG BANDED W/RUBBER/TAPE 36X54 (MISCELLANEOUS) ×3 IMPLANT
BAG DECANTER FOR FLEXI CONT (MISCELLANEOUS) IMPLANT
BAG EQP BAND 135X91 W/RBR TAPE (MISCELLANEOUS) ×1
BAG SNAP BAND KOVER 36X36 (MISCELLANEOUS) ×6 IMPLANT
BLADE 10 SAFETY STRL DISP (BLADE) ×3 IMPLANT
BLADE CORE FAN STRYKER (BLADE) ×2 IMPLANT
BLADE STERNUM SYSTEM 6 (BLADE) ×3 IMPLANT
BLADE SURG ROTATE 9660 (MISCELLANEOUS) IMPLANT
CABLE PACING FASLOC BIEGE (MISCELLANEOUS) ×3 IMPLANT
CABLE PACING FASLOC BLUE (MISCELLANEOUS) ×3 IMPLANT
CANISTER SUCTION 2500CC (MISCELLANEOUS) ×2 IMPLANT
CANNULA FEM VENOUS REMOTE 22FR (CANNULA) IMPLANT
CANNULA OPTISITE PERFUSION 16F (CANNULA) IMPLANT
CANNULA OPTISITE PERFUSION 18F (CANNULA) IMPLANT
CATH ANGIO 5F BER2 65CM (CATHETERS) ×2 IMPLANT
CATH DIAG EXPO 6F VENT PIG 145 (CATHETERS) ×4 IMPLANT
CATH S G BIP PACING (SET/KITS/TRAYS/PACK) ×6 IMPLANT
CLIP TI MEDIUM 24 (CLIP) ×3 IMPLANT
CLIP TI WIDE RED SMALL 24 (CLIP) ×3 IMPLANT
CONT SPEC 4OZ CLIKSEAL STRL BL (MISCELLANEOUS) ×2 IMPLANT
COVER DOME SNAP 22 D (MISCELLANEOUS) ×3 IMPLANT
COVER MAYO STAND STRL (DRAPES) ×3 IMPLANT
COVER TABLE BACK 60X90 (DRAPES) ×3 IMPLANT
CRADLE DONUT ADULT HEAD (MISCELLANEOUS) ×3 IMPLANT
DERMABOND ADVANCED (GAUZE/BANDAGES/DRESSINGS) ×2
DERMABOND ADVANCED .7 DNX12 (GAUZE/BANDAGES/DRESSINGS) ×1 IMPLANT
DEVICE CLOSURE PERCLS PRGLD 6F (VASCULAR PRODUCTS) IMPLANT
DRAPE INCISE IOBAN 66X45 STRL (DRAPES) IMPLANT
DRAPE SLUSH MACHINE 52X66 (DRAPES) ×3 IMPLANT
DRAPE TABLE COVER HEAVY DUTY (DRAPES) ×3 IMPLANT
DRSG TEGADERM 4X4.75 (GAUZE/BANDAGES/DRESSINGS) ×3 IMPLANT
ELECT REM PT RETURN 9FT ADLT (ELECTROSURGICAL) ×6
ELECTRODE REM PT RTRN 9FT ADLT (ELECTROSURGICAL) ×2 IMPLANT
FELT TEFLON 6X6 (MISCELLANEOUS) ×3 IMPLANT
FEMORAL VENOUS CANN RAP (CANNULA) IMPLANT
GAUZE SPONGE 4X4 12PLY STRL (GAUZE/BANDAGES/DRESSINGS) ×5 IMPLANT
GLOVE BIO SURGEON STRL SZ 6.5 (GLOVE) ×1 IMPLANT
GLOVE BIO SURGEON STRL SZ7.5 (GLOVE) ×3 IMPLANT
GLOVE BIO SURGEON STRL SZ8 (GLOVE) ×6 IMPLANT
GLOVE BIO SURGEONS STRL SZ 6.5 (GLOVE) ×1
GLOVE BIOGEL PI IND STRL 6.5 (GLOVE) IMPLANT
GLOVE BIOGEL PI INDICATOR 6.5 (GLOVE) ×4
GLOVE EUDERMIC 7 POWDERFREE (GLOVE) ×3 IMPLANT
GLOVE ORTHO TXT STRL SZ7.5 (GLOVE) ×3 IMPLANT
GLOVE SURG SS PI 6.5 STRL IVOR (GLOVE) ×2 IMPLANT
GOWN STRL REUS W/ TWL LRG LVL3 (GOWN DISPOSABLE) ×3 IMPLANT
GOWN STRL REUS W/ TWL XL LVL3 (GOWN DISPOSABLE) ×6 IMPLANT
GOWN STRL REUS W/TWL LRG LVL3 (GOWN DISPOSABLE) ×9
GOWN STRL REUS W/TWL XL LVL3 (GOWN DISPOSABLE) ×18
GUIDEWIRE SAF TJ AMPL .035X180 (WIRE) ×3 IMPLANT
GUIDEWIRE SAFE TJ AMPLATZ EXST (WIRE) ×3 IMPLANT
GUIDEWIRE STRAIGHT .035 260CM (WIRE) ×3 IMPLANT
INSERT FOGARTY 61MM (MISCELLANEOUS) ×1 IMPLANT
INSERT FOGARTY SM (MISCELLANEOUS) ×2 IMPLANT
INSERT FOGARTY XLG (MISCELLANEOUS) IMPLANT
KIT BASIN OR (CUSTOM PROCEDURE TRAY) ×3 IMPLANT
KIT DILATOR VASC 18G NDL (KITS) IMPLANT
KIT HEART LEFT (KITS) ×3 IMPLANT
KIT ROOM TURNOVER OR (KITS) ×3 IMPLANT
KIT SUCTION CATH 14FR (SUCTIONS) ×6 IMPLANT
NDL PERC 18GX7CM (NEEDLE) ×1 IMPLANT
NEEDLE PERC 18GX7CM (NEEDLE) ×3 IMPLANT
NS IRRIG 1000ML POUR BTL (IV SOLUTION) ×9 IMPLANT
PACK AORTA (CUSTOM PROCEDURE TRAY) ×3 IMPLANT
PAD ARMBOARD 7.5X6 YLW CONV (MISCELLANEOUS) ×6 IMPLANT
PAD ELECT DEFIB RADIOL ZOLL (MISCELLANEOUS) ×3 IMPLANT
PATCH TACHOSII LRG 9.5X4.8 (VASCULAR PRODUCTS) IMPLANT
PERCLOSE PROGLIDE 6F (VASCULAR PRODUCTS) ×6
SET MICROPUNCTURE 5F STIFF (MISCELLANEOUS) ×3 IMPLANT
SHEATH AVANTI 11CM 8FR (MISCELLANEOUS) ×3 IMPLANT
SHEATH PINNACLE 6F 10CM (SHEATH) ×8 IMPLANT
SLEEVE REPOSITIONING LENGTH 30 (MISCELLANEOUS) ×3 IMPLANT
SPONGE GAUZE 4X4 12PLY STER LF (GAUZE/BANDAGES/DRESSINGS) ×2 IMPLANT
SPONGE LAP 4X18 X RAY DECT (DISPOSABLE) ×3 IMPLANT
STOPCOCK MORSE 400PSI 3WAY (MISCELLANEOUS) ×9 IMPLANT
SUT ETHIBOND X763 2 0 SH 1 (SUTURE) ×3 IMPLANT
SUT GORETEX CV 4 TH 22 36 (SUTURE) ×3 IMPLANT
SUT GORETEX CV4 TH-18 (SUTURE) ×9 IMPLANT
SUT GORETEX TH-18 36 INCH (SUTURE) ×6 IMPLANT
SUT MNCRL AB 3-0 PS2 18 (SUTURE) ×3 IMPLANT
SUT PROLENE 3 0 SH1 36 (SUTURE) IMPLANT
SUT PROLENE 4 0 RB 1 (SUTURE) ×3
SUT PROLENE 4-0 RB1 .5 CRCL 36 (SUTURE) ×1 IMPLANT
SUT PROLENE 5 0 C 1 36 (SUTURE) ×6 IMPLANT
SUT PROLENE 6 0 C 1 30 (SUTURE) ×6 IMPLANT
SUT SILK  1 MH (SUTURE) ×4
SUT SILK 1 MH (SUTURE) ×1 IMPLANT
SUT SILK 2 0 SH CR/8 (SUTURE) IMPLANT
SUT VIC AB 2-0 CT1 27 (SUTURE) ×6
SUT VIC AB 2-0 CT1 TAPERPNT 27 (SUTURE) ×1 IMPLANT
SUT VIC AB 2-0 CTX 36 (SUTURE) IMPLANT
SUT VIC AB 3-0 SH 8-18 (SUTURE) ×6 IMPLANT
SUT VIC AB 3-0 X1 27 (SUTURE) ×2 IMPLANT
SYR 20CC LL (SYRINGE) ×4 IMPLANT
SYR 30ML LL (SYRINGE) ×6 IMPLANT
SYR 50ML LL SCALE MARK (SYRINGE) ×3 IMPLANT
SYRINGE 10CC LL (SYRINGE) ×6 IMPLANT
TOWEL OR 17X26 10 PK STRL BLUE (TOWEL DISPOSABLE) ×6 IMPLANT
TRANSDUCER W/STOPCOCK (MISCELLANEOUS) ×6 IMPLANT
TRAY FOLEY IC TEMP SENS 14FR (CATHETERS) ×3 IMPLANT
TUBE SUCT INTRACARD DLP 20F (MISCELLANEOUS) IMPLANT
TUBING HIGH PRESSURE 120CM (CONNECTOR) ×3 IMPLANT
VALVE HEART TRANSCATH SZ3 26MM (Prosthesis & Implant Heart) ×2 IMPLANT
WIRE AMPLATZ SS-J .035X180CM (WIRE) ×3 IMPLANT
WIRE BENTSON .035X145CM (WIRE) ×3 IMPLANT
WIRE HI TORQ VERSACORE J 260CM (WIRE) ×2 IMPLANT
WIRE LUNDERQUIST .035X180CM (WIRE) ×2 IMPLANT

## 2016-06-01 NOTE — Interval H&P Note (Signed)
History and Physical Interval Note:  06/01/2016 11:05 AM  Matthew [name]  has presented today for surgery, with the diagnosis of SEVERE AS  The various methods of treatment have been discussed with the patient and family. After consideration of risks, benefits and other options for treatment, the patient has consented to  Procedure(s): TRANSCATHETER AORTIC VALVE REPLACEMENT, TRANSFEMORAL (N/A) TRANSESOPHAGEAL ECHOCARDIOGRAM (TEE) (N/A) as a surgical intervention .  The patient's history has been reviewed, patient examined, no change in status, stable for surgery.  I have reviewed the patient's chart and labs.  Questions were answered to the patient's satisfaction.     Gaye Pollack

## 2016-06-01 NOTE — Progress Notes (Signed)
06/01/2016 6:41 PM Nursing note K 3.6 called to Dr. Burt Knack, verbal order received for 20 meq Po x 1. Orders enacted. Verbal orders also obtained to d/c a-line. Orders enacted. Will continue to closely monitor patient.  Charly Hunton, Arville Lime

## 2016-06-01 NOTE — Anesthesia Procedure Notes (Signed)
Procedure Name: Intubation Date/Time: 06/01/2016 2:22 PM Performed by: [place] Pink Pre-anesthesia Checklist: Patient identified, Emergency Drugs available, Suction available, Patient being monitored and Timeout performed Patient Re-evaluated:Patient Re-evaluated prior to inductionOxygen Delivery Method: Circle system utilized Preoxygenation: Pre-oxygenation with 100% oxygen Intubation Type: IV induction Ventilation: Mask ventilation without difficulty and Oral airway inserted - appropriate to patient size Laryngoscope Size: Mac and 3 Grade View: Grade I Tube type: Oral Tube size: 7.5 mm Number of attempts: 1 Airway Equipment and Method: Stylet Placement Confirmation: ETT inserted through vocal cords under direct vision,  positive ETCO2 and breath sounds checked- equal and bilateral Tube secured with: Tape Dental Injury: Teeth and Oropharynx as per pre-operative assessment

## 2016-06-01 NOTE — Anesthesia Preprocedure Evaluation (Signed)
Anesthesia Evaluation  Patient identified by MRN, date of birth, ID band Patient awake    Reviewed: Allergy & Precautions, NPO status , Patient's Chart, lab work & pertinent test results  Airway Mallampati: II  TM Distance: >3 FB Neck ROM: Full    Dental   Pulmonary shortness of breath, sleep apnea , former smoker,    breath sounds clear to auscultation       Cardiovascular hypertension, + angina + CAD, + Past MI and + Peripheral Vascular Disease   Rhythm:Regular Rate:Normal     Neuro/Psych    GI/Hepatic GERD  ,  Endo/Other  negative endocrine ROS  Renal/GU Renal InsufficiencyRenal disease     Musculoskeletal   Abdominal   Peds  Hematology negative hematology ROS (+)   Anesthesia Other Findings   Reproductive/Obstetrics                             Anesthesia Physical Anesthesia Plan  ASA: IV  Anesthesia Plan: General   Post-op Pain Management:    Induction: Intravenous  Airway Management Planned: Oral ETT  Additional Equipment: Ultrasound Guidance Line Placement, 3D TEE, PA Cath and Arterial line  Intra-op Plan:   Post-operative Plan: Extubation in OR  Informed Consent: I have reviewed the patients History and Physical, chart, labs and discussed the procedure including the risks, benefits and alternatives for the proposed anesthesia with the patient or authorized representative who has indicated his/her understanding and acceptance.   Dental advisory given  Plan Discussed with: CRNA and Surgeon  Anesthesia Plan Comments:         Anesthesia Quick Evaluation

## 2016-06-01 NOTE — Op Note (Signed)
[facility]   MULTIDISCIPLINARY HEART VALVE TEAM   TAVR OPERATIVE NOTE   Date of Procedure:  06/01/2016  Preoperative Diagnosis: Severe Aortic Stenosis   Postoperative Diagnosis: Same   Procedure:    Transcatheter Aortic Valve Replacement - Percutaneous Transfemoral Approach  Edwards Sapien 3 THV (size 26 mm, model # 9600TFX, serial # [id number])   Co-Surgeons:  Gilford Raid, MD and Sherren Mocha, MD  Anesthesiologist:  Dr Orene Desanctis  Echocardiographer:  Dr Johnsie Cancel  Pre-operative Echo Findings:  Severe aortic stenosis  Normal left ventricular systolic function  Post-operative Echo Findings:  Trivial paravalvular leak  Normal left ventricular systolic function  BRIEF CLINICAL NOTE AND INDICATIONS FOR SURGERY  80 year old gentleman with hypertension, hyperlipidemia, aortic stenosis and a long history of coronary artery disease s/p CABG by Dr. Servando Snare in 1991 and redo CABG in 2004. He reports at least 9-12 months of shortness of breath and chest pain with exertion. He had a cardiac cath in 07/2015 that showed continued patency of the LIMA to LAD, saphenous vein graft to OM 2, saphenous vein graft to OM 3, saphenous vein graft to the right PDA, and RIMA to the diagonal. There was a high-grade stenosis in the saphenous vein graft to OM 2 that was treated with a drug-eluting stent. At that time his peak to peak transaortic gradient was 29 mmHg with a calculated aortic valve area of 0.91. He says that he continued to have the same symptoms after the PCI and over the past 3 months his wife has noticed that he gets very short of breath with any significant activity. His echo on 07/07/2015 showed a mean gradient of 28 mm Hg. A recent echo on 04/06/2016 showed a mean gradient that was still 28 mm Hg. The dimensionless index was 0.26 and the valve area 0.89 cm2. The LVEF was 55-60%. Despite the mean gradient of 28 mmHg, his AVA is less than 1.0 square cm and the aortic valve is  severely restricted and calcified. In addition, his symptoms and exam are highly suggestive of severe AS.   During the course of the patient's preoperative work up they have been evaluated comprehensively by a multidisciplinary team of specialists coordinated through the [facility] in the [facility].  They have been demonstrated to suffer from symptomatic severe aortic stenosis as noted above. The patient has been counseled extensively as to the relative risks and benefits of all options for the treatment of severe aortic stenosis including long term medical therapy, conventional surgery for aortic valve replacement, and transcatheter aortic valve replacement.  The patient has been independently evaluated by two cardiac surgeons including Dr. Servando Snare and Dr Cyndia Bent, and they are felt to be at high risk for conventional surgical aortic valve replacement based upon a predicted risk of mortality using the Society of Thor[employer] Surgeons risk calculator of 3.9%.    Based upon review of all of the patient's preoperative diagnostic tests they are felt to be candidate for transcatheter aortic valve replacement using the transfemoral approach as an alternative to high risk conventional surgery.    Following the decision to proceed with transcatheter aortic valve replacement, a discussion has been held regarding what types of management strategies would be attempted intraoperatively in the event of life-threatening complications, including whether or not the patient would be considered a candidate for the use of cardiopulmonary bypass and/or conversion to open sternotomy for attempted surgical intervention.  The patient has been advised of  a variety of complications that might develop peculiar to this approach including but not limited to risks of death, stroke, paravalvular leak, aortic dissection or other major vascular complications, aortic annulus rupture, device  embolization, cardiac rupture or perforation, acute myocardial infarction, arrhythmia, heart block or bradycardia requiring permanent pacemaker placement, congestive heart failure, respiratory failure, renal failure, pneumonia, infection, other late complications related to structural valve deterioration or migration, or other complications that might ultimately cause a temporary or permanent loss of functional independence or other long term morbidity.  The patient provides full informed consent for the procedure as described and all questions were answered preoperatively.    DETAILS OF THE OPERATIVE PROCEDURE  PREPARATION:    The patient is brought to the operating room on the above mentioned date and central monitoring was established by the anesthesia team including placement of Swan-Ganz catheter and radial arterial line. The patient is placed in the supine position on the operating table.  Intravenous antibiotics are administered. General endotracheal anesthesia is induced uneventfully. A Foley catheter is placed.  Baseline transesophageal echocardiogram was performed. The patient's chest, abdomen, both groins, and both lower extremities are prepared and draped in a sterile manner. A time out procedure is performed.   PERIPHERAL ACCESS:    Using the modified Seldinger technique, femoral arterial and venous access was obtained with placement of 6 Fr sheaths on the left side.  A pigtail diagnostic catheter was passed through the left femoral arterial sheath under fluoroscopic guidance into the aortic root.  A temporary transvenous pacemaker catheter was passed through the left femoral venous sheath under fluoroscopic guidance into the right ventricle.  The pacemaker was tested to ensure stable lead placement and pacemaker capture. Aortic root angiography was performed in order to determine the optimal angiographic angle for valve deployment.   TRANSFEMORAL ACCESS:  A micropuncture technique is  used to access the right femoral artery under fluoroscopic guidance.  Femoral angiography is performed to verify access in the common femoral artery. 2 Perclose devices are deployed at 10' and 2' positions to 'PreClose' the femoral artery. An 8 French sheath is placed and then an Amplatz Superstiff wire is advanced through the sheath. A directional catheter is used to direct the wire through the patient's previously implanted iliac stent. This is changed out for a 16 French transfemoral E-Sheath after progressively dilating over the Superstiff wire.  It was difficult to advance the E-sheath through the stent and the wire was changed out for a Lunderquist wire. After dilating with a 16 Fr dilator, the 16 Fr E-sheath was able to cross the stent. An AL-2 catheter was used to direct a straight-tip exchange length wire across the native aortic valve into the left ventricle. This was exchanged out for a pigtail catheter and position was confirmed in the LV apex. Simultaneous LV and Ao pressures were recorded.  The pigtail catheter was exchanged for an Amplatz Extra-stiff wire in the LV apex.  Echocardiography was utilized to confirm appropriate wire position and no sign of entanglement in the mitral subvalvular apparatus.  TRANSCATHETER HEART VALVE DEPLOYMENT:  An Edwards Sapien 3 transcatheter heart valve (size 26 mm, model #9600TFX, serial # [id number] was prepared and crimped per manufacturer's guidelines, and the proper orientation of the valve is confirmed on the [employer] delivery system. The valve was advanced through the introducer sheath using normal technique until in an appropriate position in the abdominal aorta beyond the sheath tip. The balloon was then retracted and using the fine-tuning wheel  was centered on the valve. The valve was then advanced across the aortic arch using appropriate flexion of the catheter. The valve was carefully positioned across the aortic valve annulus. The Commander  catheter was retracted using normal technique. Once final position of the valve has been confirmed by angiographic assessment, the valve is deployed while temporarily holding ventilation and during rapid ventricular pacing to maintain systolic blood pressure < 50 mmHg and pulse pressure < 10 mmHg. The balloon inflation is held for >3 seconds after reaching full deployment volume. Once the balloon has fully deflated the balloon is retracted into the ascending aorta and valve function is assessed using echocardiography. There is felt to be trivial paravalvular leak and no central aortic insufficiency.  The patient's hemodynamic recovery following valve deployment is good.  The deployment balloon and guidewire are both removed. Echo demostrated acceptable post-procedural gradients, stable mitral valve function, and trivial aortic insufficiency. Aortography confirmed no greater than mild aortic insufficiency.    PROCEDURE COMPLETION:  The sheath was removed and femoral artery closure is performed using the 2 previously deployed Perclose devices.  Protamine was administered once femoral arterial repair was complete. The temporary pacemaker, pigtail catheters and femoral sheaths were removed with manual pressure used for hemostasis.   The patient tolerated the procedure well and is transported to the surgical intensive care in stable condition. There were no immediate intraoperative complications. All sponge instrument and needle counts are verified correct at completion of the operation.   The patient received a total of 60 mL of intravenous contrast during the procedure.   Sherren Mocha, MD 06/01/2016 5:36 PM

## 2016-06-01 NOTE — Progress Notes (Signed)
  Echocardiogram Echocardiogram Transesophageal has been performed.  Matthew [name] M 06/01/2016, 4:05 PM

## 2016-06-01 NOTE — Transfer of Care (Signed)
Immediate Anesthesia Transfer of Care Note  Patient: Matthew [name]  Procedure(s) Performed: Procedure(s): TRANSCATHETER AORTIC VALVE REPLACEMENT, TRANSFEMORAL (N/A) TRANSESOPHAGEAL ECHOCARDIOGRAM (TEE) (N/A)  Patient Location: ICU  Anesthesia Type:General  Level of Consciousness: awake and alert   Airway & Oxygen Therapy: Patient Spontanous Breathing and Patient connected to nasal cannula oxygen  Post-op Assessment: Report given to RN and Post -op Vital signs reviewed and stable  Post vital signs: Reviewed and stable  Last Vitals:  Filed Vitals:   06/01/16 1300 06/01/16 1305  BP:    Pulse: 55 57  Temp:    Resp: 18 15    Last Pain: There were no vitals filed for this visit.       Complications: No apparent anesthesia complications

## 2016-06-02 ENCOUNTER — Ambulatory Visit: Admit: 2016-06-02 | Payer: Medicare Other | Admitting: Dentistry

## 2016-06-02 ENCOUNTER — Inpatient Hospital Stay (HOSPITAL_COMMUNITY): Payer: Medicare Other

## 2016-06-02 ENCOUNTER — Encounter (HOSPITAL_COMMUNITY): Payer: Self-pay | Admitting: Cardiovascular Disease

## 2016-06-02 DIAGNOSIS — I350 Nonrheumatic aortic (valve) stenosis: Secondary | ICD-10-CM

## 2016-06-02 LAB — POCT I-STAT 3, ART BLOOD GAS (G3+)
Acid-Base Excess: 1 mmol/L (ref 0.0–2.0)
Bicarbonate: 26.4 mEq/L — ABNORMAL HIGH (ref 20.0–24.0)
O2 SAT: 90 %
PCO2 ART: 44.8 mmHg (ref 35.0–45.0)
PH ART: 7.378 (ref 7.350–7.450)
TCO2: 28 mmol/L (ref 0–100)
pO2, Arterial: 59 mmHg — ABNORMAL LOW (ref 80.0–100.0)

## 2016-06-02 LAB — POCT I-STAT 4, (NA,K, GLUC, HGB,HCT)
GLUCOSE: 91 mg/dL (ref 65–99)
HEMATOCRIT: 33 % — AB (ref 39.0–52.0)
Hemoglobin: 11.2 g/dL — ABNORMAL LOW (ref 13.0–17.0)
POTASSIUM: 3.7 mmol/L (ref 3.5–5.1)
Sodium: 142 mmol/L (ref 135–145)

## 2016-06-02 LAB — ECHOCARDIOGRAM COMPLETE
AOPV: 0.26 m/s
AV Area VTI index: 0.4 cm2/m2
AV Mean grad: 14 mmHg
AV peak Index: 0.41
AV pk vel: 268 cm/s
AV vel: 0.81
AVAREAMEANV: 0.85 cm2
AVAREAMEANVIN: 0.42 cm2/m2
AVAREAVTI: 0.83 cm2
AVCELMEANRAT: 0.27
AVPG: 29 mmHg
CHL CUP AV VALUE AREA INDEX: 0.4
CHL CUP DOP CALC LVOT VTI: 16.2 cm
CHL CUP STROKE VOLUME: 54 mL
DOP CAL AO MEAN VELOCITY: 168 cm/s
E decel time: 465 msec
E/e' ratio: 16.02
FS: 19 % — AB (ref 28–44)
Height: 69 in
IVS/LV PW RATIO, ED: 1.02
LA diam index: 2.25 cm/m2
LA vol A4C: 92.1 ml
LA vol index: 43.5 mL/m2
LA vol: 88.8 mL
LASIZE: 46 mm
LDCA: 3.14 cm2
LEFT ATRIUM END SYS DIAM: 46 mm
LV E/e'average: 16.02
LV TDI E'LATERAL: 7.18
LV dias vol index: 56 mL/m2
LV dias vol: 115 mL (ref 62–150)
LV e' LATERAL: 7.18 cm/s
LVEEMED: 16.02
LVOT SV: 51 mL
LVOT diameter: 20 mm
LVOTPV: 70.9 cm/s
LVOTVTI: 0.26 cm
LVSYSVOL: 61 mL (ref 21–61)
LVSYSVOLIN: 30 mL/m2
MV Dec: 465
MVPG: 5 mmHg
MVPKAVEL: 72.8 m/s
MVPKEVEL: 115 m/s
PW: 11.5 mm — AB (ref 0.6–1.1)
Reg peak vel: 214 cm/s
Simpson's disk: 47
TAPSE: 25.9 mm
TDI e' medial: 4.68
TR max vel: 214 cm/s
VTI: 62.7 cm
Valve area: 0.81 cm2
Weight: 3093.49 oz

## 2016-06-02 SURGERY — MULTIPLE EXTRACTION WITH ALVEOLOPLASTY
Anesthesia: General | Site: Mouth

## 2016-06-02 MED ORDER — ISOSORBIDE DINITRATE 20 MG PO TABS
20.0000 mg | ORAL_TABLET | ORAL | Status: DC
  Filled 2016-06-02: qty 1

## 2016-06-02 MED ORDER — SODIUM CHLORIDE 0.9 % IV SOLN: 250.0000 mL | INTRAVENOUS | Status: DC | PRN

## 2016-06-02 MED ORDER — MOVING RIGHT ALONG BOOK
Freq: Once | Status: AC
  Administered 2016-06-02: 21:00:00
  Filled 2016-06-02: qty 1

## 2016-06-02 MED ORDER — TRAMADOL HCL 50 MG PO TABS: 50.0000 mg | ORAL_TABLET | ORAL | Status: DC | PRN

## 2016-06-02 MED ORDER — ACETAMINOPHEN 325 MG PO TABS: 650.0000 mg | ORAL_TABLET | Freq: Four times a day (QID) | ORAL | Status: DC | PRN

## 2016-06-02 MED ORDER — BISACODYL 5 MG PO TBEC: 10.0000 mg | DELAYED_RELEASE_TABLET | Freq: Every day | ORAL | Status: DC | PRN

## 2016-06-02 MED ORDER — SODIUM CHLORIDE 0.9% FLUSH: 3.0000 mL | INTRAVENOUS | Status: DC | PRN

## 2016-06-02 MED ORDER — DOCUSATE SODIUM 100 MG PO CAPS
200.0000 mg | ORAL_CAPSULE | Freq: Every day | ORAL | Status: DC
  Administered 2016-06-03: 200 mg via ORAL
  Filled 2016-06-02: qty 2

## 2016-06-02 MED ORDER — ASPIRIN EC 325 MG PO TBEC
325.0000 mg | DELAYED_RELEASE_TABLET | Freq: Every day | ORAL | Status: DC
  Administered 2016-06-02: 325 mg via ORAL
  Filled 2016-06-02: qty 1

## 2016-06-02 MED ORDER — SODIUM CHLORIDE 0.9% FLUSH
3.0000 mL | Freq: Two times a day (BID) | INTRAVENOUS | Status: DC
  Administered 2016-06-02: 3 mL via INTRAVENOUS

## 2016-06-02 MED ORDER — ONDANSETRON HCL 4 MG/2ML IJ SOLN: 4.0000 mg | Freq: Four times a day (QID) | INTRAMUSCULAR | Status: DC | PRN

## 2016-06-02 MED ORDER — BISACODYL 10 MG RE SUPP: 10.0000 mg | Freq: Every day | RECTAL | Status: DC | PRN

## 2016-06-02 MED ORDER — FAMOTIDINE 20 MG PO TABS
20.0000 mg | ORAL_TABLET | Freq: Two times a day (BID) | ORAL | Status: DC
  Administered 2016-06-02 – 2016-06-03 (×2): 20 mg via ORAL
  Filled 2016-06-02 (×2): qty 1

## 2016-06-02 MED ORDER — ONDANSETRON HCL 4 MG PO TABS: 4.0000 mg | ORAL_TABLET | Freq: Four times a day (QID) | ORAL | Status: DC | PRN

## 2016-06-02 NOTE — Progress Notes (Addendum)
Subjective:  Patient seen this morning. He is feeling well. Denies chest pain or shortness of breath.  Objective:  Vital Signs in the last 24 hours: Temp:  [98.1 F (36.7 C)-99.1 F (37.3 C)] 98.6 F (37 C) (06/21 1650) Pulse Rate:  [48-66] 66 (06/21 1800) Resp:  [12-19] 19 (06/21 1800) BP: (100-162)/(43-75) 100/63 mmHg (06/21 1800) SpO2:  [92 %-99 %] 96 % (06/21 1800) Weight:  [193 lb 5.5 oz (87.7 kg)] 193 lb 5.5 oz (87.7 kg) (06/21 0500)  Intake/Output from previous day: 06/20 0701 - 06/21 0700 In: 3789.5 [P.O.:480; I.V.:2809.5; IV Piggyback:500] Out: 3205 [Urine:2805; Blood:400]  Physical Exam: Pt is alert and oriented, NAD, hard of hearing HEENT: normal Neck: JVP - normal, carotids 2+= without bruits Lungs: CTA bilaterally CV: RRR with grade 2/6 systolic ejection murmur at the right upper sternal border, no diastolic murmur Abd: soft, NT, Positive BS, no hepatomegaly Ext: no C/C/E,  bilateral groin sites are clear Skin: warm/dry no rash   Lab Results:  Recent Labs  06/01/16 1724 06/01/16 1730  WBC  --  4.6  HGB 11.2* 11.0*  PLT  --  80*    Recent Labs  06/01/16 1542 06/01/16 1611 06/01/16 1724  NA 141 141 142  K 3.5 3.6 3.7  CL 104 104  --   GLUCOSE 95 103* 91  BUN 6 6  --   CREATININE 0.70 0.80  --    No results for input(s): TROPONINI in the last 72 hours.  Invalid input(s): CK, MB  Cardiac Studies: 2-D echocardiogram: Left ventricle: Posterior basal akinesis The cavity size was mildly dilated. Systolic function was normal. The estimated ejection fraction was in the range of 50% to 55%. Doppler parameters are consistent with both elevated ventricular end-diastolic filling pressure and elevated left atrial filling pressure.  ------------------------------------------------------------------- Aortic valve: Normal appearing 26 mm Sapien 3 valve with no significant perivalvular regurgitation post TAVR. Doppler: VTI ratio of LVOT to  aortic valve: 0.26. Valve area (VTI): 0.81 cm^2. Indexed valve area (VTI): 0.39 cm^2/m^2. Peak velocity ratio of LVOT to aortic valve: 0.26. Valve area (Vmax): 0.83 cm^2. Indexed valve area (Vmax): 0.4 cm^2/m^2. Mean velocity ratio of LVOT to aortic valve: 0.27. Valve area (Vmean): 0.85 cm^2. Indexed valve area (Vmean): 0.41 cm^2/m^2. Mean gradient (S): 14 mm Hg. Peak gradient (S): 29 mm Hg.  ------------------------------------------------------------------- Mitral valve: Doppler: There was trivial regurgitation. Peak gradient (D): 5 mm Hg.  ------------------------------------------------------------------- Left atrium: The atrium was moderately dilated.  ------------------------------------------------------------------- Atrial septum: No defect or patent foramen ovale was identified.  ------------------------------------------------------------------- Right ventricle: The cavity size was normal. Wall thickness was normal. Systolic function was normal.  ------------------------------------------------------------------- Pulmonic valve: Doppler: There was mild regurgitation.  ------------------------------------------------------------------- Tricuspid valve: Doppler: There was mild regurgitation.  ------------------------------------------------------------------- Right atrium: The atrium was normal in size.  ------------------------------------------------------------------- Pericardium: The pericardium was normal in appearance.  ------------------------------------------------------------------- Systemic veins: Inferior vena cava: The vessel was normal in size. The respirophasic diameter changes were in the normal range (>= 50%), consistent with normal central venous pressure.  Tele: Sinus rhythm/sinus bradycardia  Assessment/Plan:  80 year old male postoperative day #1 from percutaneous transfemoral TAVR. The patient is doing very well.  He is now on dual antiplatelet therapy with aspirin and Plavix. His echocardiogram is reviewed and shows normal transcatheter valve function. Hemodynamics are stable as is his heart rhythm. Anticipate discharge home in the next 24-48 hours. Note he has mild thrombocytopenia which is common after TAVR. Will repeat a CBC at outpatient  follow-up.   Sherren Mocha, M.D. 06/02/2016, 6:41 PM

## 2016-06-02 NOTE — Progress Notes (Signed)
Echocardiogram 2D Echocardiogram has been performed.  Matthew [name] 06/02/2016, 11:25 AM

## 2016-06-02 NOTE — Care Management Important Message (Signed)
Important Message  Patient Details  Name: Matthew [name] MRN: [id number] Date of Birth: 01-30-1936   Medicare Important Message Given:  Yes    Loann Quill 06/02/2016, 1:43 PM

## 2016-06-02 NOTE — Progress Notes (Signed)
1 Day Post-Op Procedure(s) (LRB): TRANSCATHETER AORTIC VALVE REPLACEMENT, TRANSFEMORAL (N/A) TRANSESOPHAGEAL ECHOCARDIOGRAM (TEE) (N/A) Subjective:  No complaints. Ambulated already. Wants to go home.  Nurse reports a small amount of oozing from left groin site last pm.  Objective: Vital signs in last 24 hours: Temp:  [96.4 F (35.8 C)-99.1 F (37.3 C)] 98.2 F (36.8 C) (06/21 0733) Pulse Rate:  [48-65] 48 (06/21 0800) Cardiac Rhythm:  [-] Sinus bradycardia (06/21 0800) Resp:  [9-18] 15 (06/21 0800) BP: (103-150)/(43-65) 122/45 mmHg (06/21 0800) SpO2:  [93 %-99 %] 99 % (06/21 0800) Arterial Line BP: (157-165)/(53-67) 159/55 mmHg (06/20 1830) Weight:  [86.818 kg (191 lb 6.4 oz)-87.7 kg (193 lb 5.5 oz)] 87.7 kg (193 lb 5.5 oz) (06/21 0500)  Hemodynamic parameters for last 24 hours: PAP: (53)/(27) 53/27 mmHg  Intake/Output from previous day: 06/20 0701 - 06/21 0700 In: 3789.5 [P.O.:480; I.V.:2809.5; IV Piggyback:500] Out: 3205 [Urine:2805; Blood:400] Intake/Output this shift: Total I/O In: 50 [I.V.:50] Out: 35 [Urine:35]  General appearance: alert and cooperative Neurologic: intact Heart: regular rate and rhythm, S1, S2 normal, no murmur, click, rub or gallop Lungs: clear to auscultation bilaterally Abdomen: soft, non-tender; bowel sounds normal; no masses,  no organomegaly Extremities: right groin site looks good. Left groin with small hematoma and eccymosis but stable. pedal pulses palpable  Lab Results:  Recent Labs  06/01/16 1611 06/01/16 1730  WBC  --  4.6  HGB 10.2* 11.0*  HCT 30.0* 34.3*  PLT  --  80*   BMET:  Recent Labs  06/01/16 1542 06/01/16 1611  NA 141 141  K 3.5 3.6  CL 104 104  GLUCOSE 95 103*  BUN 6 6  CREATININE 0.70 0.80    PT/INR: No results for input(s): LABPROT, INR in the last 72 hours. ABG    Component Value Date/Time   PHART 7.411 05/28/2016 1403   HCO3 24.6* 05/28/2016 1403   TCO2 24 06/01/2016 1611   ACIDBASEDEF 1.0  05/26/2016 1355   O2SAT 96.2 05/28/2016 1403   CBG (last 3)  No results for input(s): GLUCAP in the last 72 hours.  CXR: bibasilar atelectasis. Mild interstitial edema improved.  Assessment/Plan: S/P Procedure(s) (LRB): TRANSCATHETER AORTIC VALVE REPLACEMENT, TRANSFEMORAL (N/A) TRANSESOPHAGEAL ECHOCARDIOGRAM (TEE) (N/A)  Doing well after TAVR. Remove foley and sleeve 2D echo today ASA and Plavix for valve Transfer to 2W and mobilize Possibly home tomorrow depending on his day today.  He has a HR in the 50's sinus brady unchanged from preop. He was on Lopressor 12.5 mg at bedtime preop.     LOS: 1 day    Gaye Pollack 06/02/2016

## 2016-06-02 NOTE — Op Note (Signed)
[facility]   MULTIDISCIPLINARY HEART VALVE TEAM   TAVR OPERATIVE NOTE   Date of Procedure:  06/01/2016  Preoperative Diagnosis: Severe Aortic Stenosis   Postoperative Diagnosis: Same   Procedure:    Transcatheter Aortic Valve Replacement - Percutaneous Right Transfemoral Approach  Edwards Sapien 3 THV (size 26 mm, model # 9600TFX, serial # [id number])   Co-Surgeons:  Gaye Pollack MD and Sherren Mocha, MD   Anesthesiologist:  Rica Koyanagi, MD  Echocardiographer:  Jenkins Rouge, MD  Pre-operative Echo Findings:   severe aortic stenosis   Normal left ventricular systolic function   Post-operative Echo Findings:  Trivial paravalvular leak   Normal left ventricular systolic function   BRIEF CLINICAL NOTE AND INDICATIONS FOR SURGERY  The patient is a 80 year old gentleman with hypertension, hyperlipidemia, aortic stenosis and a long history of coronary artery disease s/p CABG by Dr. Servando Snare in 1991 and redo CABG in 2004. He reports at least 9-12 months of shortness of breath and chest pain with exertion. He had a cardiac cath in 07/2015 that showed continued patency of the LIMA to LAD, saphenous vein graft to OM 2, saphenous vein graft to OM 3, saphenous vein graft to the right PDA, and RIMA to the diagonal. There was a high-grade stenosis in the saphenous vein graft to OM 2 that was treated with a drug-eluting stent. At that time his peak to peak transaortic gradient was 29 mmHg with a calculated aortic valve area of 0.91. He says that he continued to have the same symptoms after the PCI and over the past 3 months his wife has noticed that he gets very short of breath with any significant activity. His echo on 07/07/2015 showed a mean gradient of 28 mm Hg. A recent echo on 04/06/2016 showed a mean gradient that was still 28 mm Hg. The dimensionless index was 0.26 and the valve area 0.89 cm2. The LVEF was 55-60%.   He has stage D severe, symptomatic aortic  stenosis with shortness of breath, fatigue and chest pain with mild exertion ( NYHA class II-III). I have personally reviewed his prior cardiac cath, recent echo and CTA studies. His mean gradient is only 28 mm Hg by echo but the valve is thickened and moderately calcified with restricted mobility. His DI is 0.26 and the valve area is 0.89 cm2. I suspect that he has low gradient, normal EF severe AS. His symptoms are consistent with severe aortic stenosis and repeat cath shows patent grafts and an 80% OM2 native vessel stenosis. He does not require any revascularization prior to treatment of his aortic stenosis. I think AVR is indicated in this patient with progressive symptoms. I think TAVR is the best option for this patient due to the high risk of SAVR since he has had two prior CABG surgeries.   The patient and his wife were counseled at length regarding treatment alternatives for management of severe symptomatic aortic stenosis. Alternative approaches such as conventional aortic valve replacement, transcatheter aortic valve replacement, and palliative medical therapy were compared and contrasted at length. The risks associated with conventional surgical aortic valve replacement were been discussed in detail, as were expectations for post-operative convalescence. Long-term prognosis with medical therapy was discussed.  His gated cardiac CT shows anatomy suitable for a Sapien 3 valve. The abdominal and pelvic CT shows adequate pelvic vessels for a right transfemoral approach. He has had bilateral iliac artery stenting and has some narrowing on the left but the right iliac  dimensions are large enough to allow a 74F sheath to pass.   Following the decision to proceed with transcatheter aortic valve replacement, a discussion has been held regarding what types of management strategies would be attempted intraoperatively in the event of life-threatening complications, including whether or not the patient would  be considered a candidate for the use of cardiopulmonary bypass and/or conversion to open sternotomy for attempted surgical intervention.  The patient has been advised of a variety of complications that might develop peculiar to this approach including but not limited to risks of death, stroke, paravalvular leak, aortic dissection or other major vascular complications, aortic annulus rupture, device embolization, cardiac rupture or perforation, acute myocardial infarction, arrhythmia, heart block or bradycardia requiring permanent pacemaker placement, congestive heart failure, respiratory failure, renal failure, pneumonia, infection, other late complications related to structural valve deterioration or migration, or other complications that might ultimately cause a temporary or permanent loss of functional independence or other long term morbidity.  The patient provides full informed consent for the procedure as described and all questions were answered preoperatively.    DETAILS OF THE OPERATIVE PROCEDURE  PREPARATION:    The patient is brought to the operating room on the above mentioned date and central monitoring was established by the anesthesia team including placement of Swan-Ganz catheter and radial arterial line. The patient is placed in the supine position on the operating table.  Intravenous antibiotics are administered.  General endotracheal anesthesia is induced uneventfully.  A Foley catheter is placed.  Baseline transesophageal echocardiogram was performed. The patient's chest, abdomen, both groins, and both lower extremities are prepared and draped in a sterile manner. A time out procedure is performed.   Peripheral and transfemoral access was performed by Dr. Burt Knack and will be dictated in his note.   BALLOON AORTIC VALVULOPLASTY:   Not performed  TRANSCATHETER HEART VALVE DEPLOYMENT:   An Edwards Sapien 3 transcatheter heart valve (size 26 mm, model #9600TFX, serial [id number])  was prepared and crimped per manufacturer's guidelines, and the proper orientation of the valve is confirmed on the [employer] delivery system. The valve was advanced through the introducer sheath using normal technique until in an appropriate position in the abdominal aorta beyond the sheath tip. The balloon was then retracted and using the fine-tuning wheel was centered on the valve. The valve was then advanced across the aortic arch using appropriate flexion of the catheter. The valve was carefully positioned across the aortic valve annulus. The Commander catheter was retracted using normal technique. Once final position of the valve has been confirmed by angiographic assessment, the valve is deployed while temporarily holding ventilation and during rapid ventricular pacing to maintain systolic blood pressure < 50 mmHg and pulse pressure < 10 mmHg. The balloon inflation is held for >3 seconds after reaching full deployment volume. Once the balloon has fully deflated the balloon is retracted into the ascending aorta and valve function is assessed using echocardiography. There is felt to be trivial paravalvular leak and no central aortic insufficiency.  The patient's hemodynamic recovery following valve deployment is good.  The deployment balloon and guidewire are both removed. Echo demostrated acceptable post-procedural gradients, stable mitral valve function, and trivial aortic insufficiency. Aortography confirmed no greater than mild aortic insufficiency.    PROCEDURE COMPLETION:   The sheath was removed and femoral artery closure performed by Dr Burt Knack. Please see his separate report for details.  Protamine was administered once femoral arterial repair was complete. The temporary pacemaker, pigtail catheters and  femoral sheaths were removed with manual pressure used for hemostasis.   The patient tolerated the procedure well and is transported to the surgical intensive care in stable condition.  There were no immediate intraoperative complications. All sponge instrument and needle counts are verified correct at completion of the operation.   No blood products were administered during the operation.  The patient received a total of 60 mL of intravenous contrast during the procedure.   Gaye Pollack, MD

## 2016-06-02 NOTE — Anesthesia Postprocedure Evaluation (Signed)
Anesthesia Post Note  Patient: GYASI COVER  Procedure(s) Performed: Procedure(s) (LRB): TRANSCATHETER AORTIC VALVE REPLACEMENT, TRANSFEMORAL (N/A) TRANSESOPHAGEAL ECHOCARDIOGRAM (TEE) (N/A)  Patient location during evaluation: SICU Anesthesia Type: General Level of consciousness: awake and alert Pain management: pain level controlled Vital Signs Assessment: post-procedure vital signs reviewed and stable Respiratory status: spontaneous breathing, nonlabored ventilation, respiratory function stable and patient connected to nasal cannula oxygen Cardiovascular status: blood pressure returned to baseline and stable Postop Assessment: no signs of nausea or vomiting Anesthetic complications: no    Last Vitals:  Filed Vitals:   06/02/16 0733 06/02/16 0800  BP:  122/45  Pulse:  48  Temp: 36.8 C   Resp:  15    Last Pain:  Filed Vitals:   06/02/16 0801  PainSc: 0-No pain                 Monesha Monreal,JAMES TERRILL

## 2016-06-03 LAB — TYPE AND SCREEN
ABO/RH(D): O NEG
ANTIBODY SCREEN: NEGATIVE
UNIT DIVISION: 0
UNIT DIVISION: 0
Unit division: 0
Unit division: 0

## 2016-06-03 LAB — BASIC METABOLIC PANEL
ANION GAP: 5 (ref 5–15)
BUN: 8 mg/dL (ref 6–20)
CALCIUM: 8.7 mg/dL — AB (ref 8.9–10.3)
CO2: 28 mmol/L (ref 22–32)
Chloride: 104 mmol/L (ref 101–111)
Creatinine, Ser: 0.94 mg/dL (ref 0.61–1.24)
Glucose, Bld: 121 mg/dL — ABNORMAL HIGH (ref 65–99)
Potassium: 3.6 mmol/L (ref 3.5–5.1)
Sodium: 137 mmol/L (ref 135–145)

## 2016-06-03 LAB — CBC
HCT: 35.1 % — ABNORMAL LOW (ref 39.0–52.0)
Hemoglobin: 11.1 g/dL — ABNORMAL LOW (ref 13.0–17.0)
MCH: 28.7 pg (ref 26.0–34.0)
MCHC: 31.6 g/dL (ref 30.0–36.0)
MCV: 90.7 fL (ref 78.0–100.0)
PLATELETS: 78 10*3/uL — AB (ref 150–400)
RBC: 3.87 MIL/uL — AB (ref 4.22–5.81)
RDW: 13.3 % (ref 11.5–15.5)
WBC: 5.8 10*3/uL (ref 4.0–10.5)

## 2016-06-03 MED ORDER — METOPROLOL TARTRATE 12.5 MG HALF TABLET: 12.5000 mg | ORAL_TABLET | Freq: Every day | ORAL | Status: DC

## 2016-06-03 MED ORDER — ASPIRIN 81 MG PO CHEW
81.0000 mg | CHEWABLE_TABLET | Freq: Every day | ORAL | Status: DC
  Administered 2016-06-03: 81 mg via ORAL
  Filled 2016-06-03: qty 1

## 2016-06-03 MED ORDER — CLOPIDOGREL BISULFATE 75 MG PO TABS: 75.0000 mg | ORAL_TABLET | Freq: Every day | ORAL | Status: DC

## 2016-06-03 NOTE — Progress Notes (Signed)
CARDIAC REHAB PHASE I   PRE:  Rate/Rhythm: 56 SB  BP:  Supine: 147/58  Sitting:   Standing:    SaO2: 96% 2L  MODE:  Ambulation: 400 ft   POST:  Rate/Rhythm: 81  BP:  Supine:   Sitting: 182/69, 169/68  Standing:    SaO2: 94%RA 0800-0840 Pt can get 2500 ml on IS. Walked 400 ft on RA with minimal asst. Tolerated well. To recliner after walk. Encouraged IS, walking as tolerated and watching sodium. Heart healthy diet given. Not interested in CRP 2.   Graylon Good, RN BSN  06/03/2016 8:35 AM

## 2016-06-03 NOTE — Progress Notes (Signed)
    Subjective:  Patient feels well. He walked the halls twice yesterday without symptoms. He is eager to go home. He denies chest pain or shortness of breath.  Objective:  Vital Signs in the last 24 hours: Temp:  [97.6 F (36.4 C)-98.8 F (37.1 C)] 98.8 F (37.1 C) (06/22 0606) Pulse Rate:  [50-66] 59 (06/22 0606) Resp:  [15-19] 16 (06/22 0606) BP: (100-162)/(37-75) 146/55 mmHg (06/22 0606) SpO2:  [92 %-100 %] 100 % (06/22 0606) Weight:  [186 lb (84.369 kg)] 186 lb (84.369 kg) (06/22 0606)  Intake/Output from previous day: 06/21 0701 - 06/22 0700 In: 150 [I.V.:100; IV Piggyback:50] Out: 3035 [Urine:3035]  Physical Exam: Pt is alert and oriented, NAD HEENT: normal Neck: JVP - normal Lungs: CTA bilaterally CV: RRR with 2/6 systolic ejection murmur at the right upper sternal border Abd: soft, NT, Positive BS, no hepatomegaly Ext: no C/C/E, distal pulses intact and equal, bilateral groin sites are clear, there is stable ecchymosis in the left groin without hematoma Skin: warm/dry no rash   Lab Results:  Recent Labs  06/01/16 1730 06/03/16 0242  WBC 4.6 5.8  HGB 11.0* 11.1*  PLT 80* 78*    Recent Labs  06/01/16 1611 06/01/16 1724 06/03/16 0242  NA 141 142 137  K 3.6 3.7 3.6  CL 104  --  104  CO2  --   --  28  GLUCOSE 103* 91 121*  BUN 6  --  8  CREATININE 0.80  --  0.94   No results for input(s): TROPONINI in the last 72 hours.  Invalid input(s): CK, MB  Tele: Personally reviewed, sinus rhythm  Assessment/Plan:  1. Severe symptomatic aortic stenosis status post TAVR, postoperative day #2 2. Coronary artery disease, native vessel and bypass graft disease: Appears stable without symptoms of angina 3. Thrombocytopenia, post TAVR 4. Postoperative anemia, mild  The patient is stable and appears ready for discharge. He has been restarted on Plavix. He will be back on his normal home medical regimen - medications are reviewed. Postoperative echo revealed  normal transcatheter heart valve function. His 30 day valve clinic and echocardiogram are arranged.   Sherren Mocha, M.D. 06/03/2016, 8:01 AM

## 2016-06-03 NOTE — Progress Notes (Signed)
Orders received to discharge patient.  Patient expresses readiness to discharge.  Telemetry monitor was removed and CCMD notified.  IV removed.  Discharge instructions, follow, medications and instructions for their use were discussed with patient and patient expressed understanding.  Patient denied any pain at time of discharge.

## 2016-06-03 NOTE — Discharge Summary (Signed)
Discharge Summary    Patient ID: Matthew [name],  MRN: [id number], DOB/AGE: 03-12-1936 80 y.o.  Admit date: 06/01/2016 Discharge date: 06/03/2016  Primary Care Provider: Antionette Fairy Primary Cardiologist: Dr. Fletcher Anon Primary Valvular specialist: Dr. Burt Knack  Discharge Diagnoses    Principal Problem:   Severe aortic stenosis Active Problems:   HLD (hyperlipidemia)   Obstructive sleep apnea   Essential hypertension   GERD (gastroesophageal reflux disease)   PAD (peripheral artery disease) (HCC)   Coronary artery disease   Thrombocytopenia (HCC)   Allergies No Known Allergies  Diagnostic Studies/Procedures    TAVR 06/01/2016  Transcatheter Aortic Valve Replacement - Percutaneous Transfemoral Approach Edwards Sapien 3 THV (size 26 mm, model # 9600TFX, serial # [id number])  Pre-operative Echo Findings:  Severe aortic stenosis  Normal left ventricular systolic function  Post-operative Echo Findings:  Trivial paravalvular leak  Normal left ventricular systolic function    Echo 06/02/2016 LV EF: 50% - 55%  ------------------------------------------------------------------- Indications: Aortic stenosis 424.1.  ------------------------------------------------------------------- History: PMH: S/P TAVR. Coronary artery disease. Risk factors: Dyslipidemia.  ------------------------------------------------------------------- Study Conclusions  - Left ventricle: Posterior basal akinesis The cavity size was  mildly dilated. Systolic function was normal. The estimated  ejection fraction was in the range of 50% to 55%. Doppler  parameters are consistent with both elevated ventricular  end-diastolic filling pressure and elevated left atrial filling  pressure. - Aortic valve: Normal appearing 26 mm Sapien 3 valve with no  significant perivalvular regurgitation post TAVR. Valve area  (VTI): 0.81 cm^2. Valve area (Vmax): 0.83 cm^2.  Valve area  (Vmean): 0.85 cm^2. - Left atrium: The atrium was moderately dilated. - Atrial septum: No defect or patent foramen ovale was identified. _____________   History of Present Illness     Matthew [name] is a 80 y.o. male who presents for evaluation of aortic stenosis. He's been followed by Dr. Fletcher Anon. The patient has extensive coronary artery disease and he has undergone 2 bypass surgeries in the past. His initial surgery was in 1991. His redo CABG was in 2004. He underwent cardiac catheterization last year after he presented with unstable angina. He was noted to have continued patency of the LIMA to LAD, saphenous vein graft OM 2, saphenous vein graft OM 3, saphenous vein graft to the right PDA, and RIMA to diagonal. There was a high-grade stenosis in the saphenous vein graft OM 2 treated with a drug-eluting stent. At that time his peak to peak transaortic gradient was 29 mmHg with a calculated aortic valve area of 0.91 based on Fick cardiac output and the Gorlin formula. Echocardiogram obtained on 04/06/2016 showed EF 0000000, grade 1 diastolic dysfunction, moderate to severe areas with mean gradient 28 mmHg. He underwent repeat cardiac catheterization on 05/26/2016 which showed severe underlying 3 vessel CAD with patent grafts, normal cardiac output, right heart catheterization showed a high normal pulmonary pressure and filling pressure, moderate left subclavian stenosis without significant gradient. After discussing various options with the patient, he eventually agreed to undergo TAVR.    Hospital Course     He underwent the planned procedure on 06/01/2016 by Dr. Cyndia Bent with Berniece Pap 3 THV valve. Post procedure, he recovered well, he was restarted on Plavix. Postoperative echocardiogram obtained on 06/02/2016 showed EF 50-55%, normal appearing 26 mm Edwards Sapien valve with no significant perivalvular regurgitation post TAVR, we have arranged 30 days follow-up in the valvular clinic  with repeat echocardiogram. He does have mild thrombocytopenia which is normal after TAVR,  the plan will to repeat a CBC on outpatient follow-up. I have asked patient to monitor for any bleeding issue.  _____________  Discharge Vitals Blood pressure 146/55, pulse 59, temperature 98.8 F (37.1 C), temperature source Oral, resp. rate 16, height 5\' 9"  (1.753 m), weight 186 lb (84.369 kg), SpO2 100 %.  Filed Weights   06/01/16 0956 06/02/16 0500 06/03/16 0606  Weight: 191 lb 6.4 oz (86.818 kg) 193 lb 5.5 oz (87.7 kg) 186 lb (84.369 kg)    Labs & Radiologic Studies     CBC  Recent Labs  06/01/16 1730 06/03/16 0242  WBC 4.6 5.8  HGB 11.0* 11.1*  HCT 34.3* 35.1*  MCV 90.0 90.7  PLT 80* 78*   Basic Metabolic Panel  Recent Labs  06/01/16 1611 06/01/16 1724 06/03/16 0242  NA 141 142 137  K 3.6 3.7 3.6  CL 104  --  104  CO2  --   --  28  GLUCOSE 103* 91 121*  BUN 6  --  8  CREATININE 0.80  --  0.94  CALCIUM  --   --  8.7*   Dg Chest 2 View  05/28/2016  CLINICAL DATA:  Transcatheter aortic valve replacement. Transfemoral 06/01/2016. EXAM: CHEST  2 VIEW COMPARISON:  Chest x-ray 07/28/2015 and CT 05/07/2016 FINDINGS: Previous median sternotomy. Lungs are adequately inflated without focal consolidation or effusion. Mild bibasilar scarring. Cardiomediastinal silhouette is within normal. There is calcified plaque over the aortic arch. There are mild degenerative changes of the spine. IMPRESSION: No acute cardiopulmonary disease. Electronically Signed   By: Marin Olp M.D.   On: 05/28/2016 16:36   Ct Angio Chest W/cm &/or Wo Cm  05/07/2016  CLINICAL DATA:  80 year old male with history of severe aortic stenosis. Preprocedural study prior to potential transcatheter aortic valve replacement (TAVR) procedure. EXAM: CT ANGIOGRAPHY CHEST, ABDOMEN AND PELVIS TECHNIQUE: Multidetector CT imaging through the chest, abdomen and pelvis was performed using the standard protocol during bolus  administration of intravenous contrast. Multiplanar reconstructed images and MIPs were obtained and reviewed to evaluate the vascular anatomy. CONTRAST:  75 mL of Isovue 370 COMPARISON:  CT the abdomen and pelvis 06/28/2013. FINDINGS: CTA CHEST FINDINGS Mediastinum/Lymph Nodes: Heart size is mildly enlarged. There is no significant pericardial fluid, thickening or pericardial calcification. There is atherosclerosis of the thoracic aorta, the great vessels of the mediastinum and the coronary arteries, including calcified atherosclerotic plaque in the left main, left anterior descending, left circumflex and right coronary arteries. Status post median sternotomy for CABG, including LIMA to the LAD. Severe thickening and calcifications of the aortic valve. Some borderline enlarged lymph nodes are noted in the hilar regions bilaterally, nonspecific and favored to be reactive. No pathologically enlarged mediastinal lymph nodes. Esophagus is normal in appearance. No axillary lymphadenopathy. Lungs/Pleura: Lung volumes appear low and there are some dependent areas of ground-glass attenuation in the lower lobes of the lungs bilaterally, favored to reflect areas of developing subsegmental atelectasis. No acute consolidative airspace disease. No pleural effusions. No suspicious appearing pulmonary nodules or masses. Musculoskeletal/Soft Tissues: Median sternotomy wires. There are no aggressive appearing lytic or blastic lesions noted in the visualized portions of the skeleton. CTA ABDOMEN AND PELVIS FINDINGS Hepatobiliary: No cystic or solid hepatic lesions. No intra or extrahepatic biliary ductal dilatation. Status post cholecystectomy. Pancreas: No pancreatic mass. No pancreatic ductal dilatation. No pancreatic or peripancreatic fluid or inflammatory changes. Spleen: Unremarkable. Adrenals/Urinary Tract: Mild diffuse cortical atrophy in both kidneys. Multiple sub cm low-attenuation lesions in  the kidneys bilaterally, too  small to characterize, but statistically likely cysts. Exophytic 2 cm simple cyst in the lateral aspect of the interpolar region of the left kidney. No hydroureteronephrosis. Urinary bladder is unremarkable in appearance. Bilateral adrenal glands are normal in appearance. Stomach/Bowel: Normal appearance of the stomach. No pathologic dilatation of small bowel or colon. Several colonic diverticulae are noted, without surrounding inflammatory changes to suggest an acute diverticulitis at this time. Normal appendix. Vascular/Lymphatic: Extensive atherosclerosis throughout the abdominal and pelvic vasculature, with vascular findings and measurements pertinent to potential TAVR procedure, as detailed below. In addition, there is mild aneurysmal dilatation of the right common iliac artery which measures up to 1.8 cm in diameter. No lymphadenopathy noted in the abdomen or pelvis. Reproductive: Prostate gland and seminal vesicles are unremarkable in appearance. Other: No significant volume of ascites.  No pneumoperitoneum. Musculoskeletal: There are no aggressive appearing lytic or blastic lesions noted in the visualized portions of the skeleton. VASCULAR MEASUREMENTS PERTINENT TO TAVR: AORTA: Minimal Aortic Diameter -  12 x 13 mm Severity of Aortic Calcification -  severe RIGHT PELVIS: Right Common Iliac Artery - Minimal Diameter - 6.4 x 6.0 mm Tortuosity - mild Calcification - severe Comment - There is a right common iliac artery stent in place, with the narrowest luminal diameter of 7.8 x 6.9 mm. Right External Iliac Artery - Minimal Diameter - 8.2 x 5.2 mm Tortuosity - moderate Calcification - mild Right Common Femoral Artery - Minimal Diameter - 6.2 x 8.2 mm Tortuosity - mild Calcification - mild-to-moderate LEFT PELVIS: Left Common Iliac Artery - Minimal Diameter - 4.7 x 4.3 mm Tortuosity - mild Calcification - severe Comment - There is a left common iliac artery stent in place, with the narrowest luminal diameter of  4.7 x 4.3 mm. Left External Iliac Artery - Minimal Diameter - 6.5 x 7.5 mm Tortuosity - moderate Calcification - mild Left Common Femoral Artery - Minimal Diameter - 8.1 x 6.4 mm Tortuosity - mild Calcification - mild-to-moderate Review of the MIP images confirms the above findings. IMPRESSION: 1. Vascular findings and measurements pertinent to potential TAVR procedure, as detailed above. This patient appears to have suitable pelvic arterial access on the right side. However, bilateral common iliac artery stents are present, as discussed above. 2. Thickening calcification of the aortic valve, compatible with the reported clinical history of severe aortic stenosis. 3. Atherosclerosis, including left main and 3 vessel coronary artery disease. Status post median sternotomy for CABG, including LIMA to the LAD. 4. Colonic diverticulosis without evidence of acute diverticulitis at this time. 5. Additional incidental findings, as above. Electronically Signed   By: Vinnie Langton M.D.   On: 05/07/2016 13:05   Ct Coronary Morp W/cta Cor W/score W/ca W/cm &/or Wo/cm  05/07/2016  ADDENDUM REPORT: 05/07/2016 12:41 CLINICAL DATA:  Aortic stenosis EXAM: Cardiac TAVR CT TECHNIQUE: The patient was scanned on a Philips 256 scanner. A 120 kV retrospective scan was triggered in the descending thoracic aorta at 111 HU's. Gantry rotation speed was 270 msecs and collimation was .9 mm. No beta blockade or nitro were given. The 3D data set was reconstructed in 5% intervals of the R-R cycle. Systolic and diastolic phases were analyzed on a dedicated work station using MPR, MIP and VRT modes. The patient received 80 cc of contrast. FINDINGS: Aortic Valve:  Tri-leaflet and moderately calcified. Aorta: Moderate calcification of arch with normal origin of great vessels Previous CABG with SVG markers and LIMA Sinotubular Junction:  28 mm  Ascending Thoracic Aorta:  33 mm Aortic Arch:  28 mm Descending Thoracic Aorta:  27 mm Sinus of Valsalva  Measurements: Non-coronary:  33 mm Right -coronary:  34 mm Left -coronary:  37 mm Coronary Artery Height above Annulus: Left Main:  15 mm Right Coronary:  21 mm Virtual Basal Annulus Measurements: Maximum/Minimum Diameter:  22.7 mm x 29.4 mm Perimeter:  84 mm Area:  532 mm2 Coronary Arteries:  Sufficient height above annulus for deployment Optimum Fluoroscopic Angle for Delivery:  AP IMPRESSION: 1) Calcified trileaflet aortic valve with annulus 532 mm2 suitable for a 29 mm Sapien 3 valve 2) Sufficient coronary height for deployment 3) Optimum angiographic angle for deployment straight AP [place] Electronically Signed   By: Jenkins Rouge M.D.   On: 05/07/2016 12:41  05/07/2016  EXAM: OVER-READ INTERPRETATION  CT CHEST The following report is an over-read performed by radiologist Dr. Rebekah Chesterfield [facility] on 05/07/2016. This over-read does not include interpretation of cardiac or coronary anatomy or pathology. The coronary calcium score/coronary CTA interpretation by the cardiologist is attached. COMPARISON:  No priors. FINDINGS: Extracardiac findings will be described separately on dictation for contemporaneously obtained CTA of the chest, abdomen and pelvis. IMPRESSION: Please see separate dictation for contemporaneously obtained CTA of the chest, abdomen and pelvis 05/07/2016 for full description of extracardiac findings. Electronically Signed: By: Vinnie Langton M.D. On: 05/07/2016 10:57   Dg Chest Port 1 View  06/02/2016  CLINICAL DATA:  Severe aortic stenosis EXAM: PORTABLE CHEST 1 VIEW COMPARISON:  06/01/2016 FINDINGS: Cardiomediastinal silhouette is stable. Central mild vascular congestion and mild residual perihilar interstitial edema with improvement from prior exam. Right Swan-Ganz catheter has been removed. There is right IJ sheath in place. Status post median sternotomy and CABG. Small right pleural effusion. Bilateral basilar atelectasis or infiltrate. There is no  pneumothorax. IMPRESSION: Right Swan-Ganz catheter has been removed. Status post median sternotomy and CABG. Central mild vascular congestion and mild perihilar residual interstitial edema with improvement from prior exam. Small right pleural effusion. Bilateral basilar atelectasis or infiltrate. Electronically Signed   By: Lahoma Crocker M.D.   On: 06/02/2016 07:57   Dg Chest Port 1 View  06/01/2016  CLINICAL DATA:  Severe aortic stenosis. Status post transcatheter aortic valve replacement. EXAM: PORTABLE CHEST 1 VIEW COMPARISON:  05/28/2016 FINDINGS: Right jugular Swan-Ganz catheter is seen with tip overlying the right intralobar pulmonary artery. Prosthetic aortic valve noted as well as Previous CABG. Low lung volumes are seen with mild bibasilar atelectasis. There is also diffuse interstitial infiltrates, consistent with interstitial edema. No evidence of focal consolidation or pleural effusion. No pneumothorax visualized. IMPRESSION: Diffuse pulmonary interstitial edema and mild bibasilar atelectasis. No pneumothorax visualized. Electronically Signed   By: Earle Gell M.D.   On: 06/01/2016 17:53   Ct Angio Abd/pel W/ And/or W/o  05/07/2016  CLINICAL DATA:  80 year old male with history of severe aortic stenosis. Preprocedural study prior to potential transcatheter aortic valve replacement (TAVR) procedure. EXAM: CT ANGIOGRAPHY CHEST, ABDOMEN AND PELVIS TECHNIQUE: Multidetector CT imaging through the chest, abdomen and pelvis was performed using the standard protocol during bolus administration of intravenous contrast. Multiplanar reconstructed images and MIPs were obtained and reviewed to evaluate the vascular anatomy. CONTRAST:  75 mL of Isovue 370 COMPARISON:  CT the abdomen and pelvis 06/28/2013. FINDINGS: CTA CHEST FINDINGS Mediastinum/Lymph Nodes: Heart size is mildly enlarged. There is no significant pericardial fluid, thickening or pericardial calcification. There is atherosclerosis of the thoracic  aorta, the great  vessels of the mediastinum and the coronary arteries, including calcified atherosclerotic plaque in the left main, left anterior descending, left circumflex and right coronary arteries. Status post median sternotomy for CABG, including LIMA to the LAD. Severe thickening and calcifications of the aortic valve. Some borderline enlarged lymph nodes are noted in the hilar regions bilaterally, nonspecific and favored to be reactive. No pathologically enlarged mediastinal lymph nodes. Esophagus is normal in appearance. No axillary lymphadenopathy. Lungs/Pleura: Lung volumes appear low and there are some dependent areas of ground-glass attenuation in the lower lobes of the lungs bilaterally, favored to reflect areas of developing subsegmental atelectasis. No acute consolidative airspace disease. No pleural effusions. No suspicious appearing pulmonary nodules or masses. Musculoskeletal/Soft Tissues: Median sternotomy wires. There are no aggressive appearing lytic or blastic lesions noted in the visualized portions of the skeleton. CTA ABDOMEN AND PELVIS FINDINGS Hepatobiliary: No cystic or solid hepatic lesions. No intra or extrahepatic biliary ductal dilatation. Status post cholecystectomy. Pancreas: No pancreatic mass. No pancreatic ductal dilatation. No pancreatic or peripancreatic fluid or inflammatory changes. Spleen: Unremarkable. Adrenals/Urinary Tract: Mild diffuse cortical atrophy in both kidneys. Multiple sub cm low-attenuation lesions in the kidneys bilaterally, too small to characterize, but statistically likely cysts. Exophytic 2 cm simple cyst in the lateral aspect of the interpolar region of the left kidney. No hydroureteronephrosis. Urinary bladder is unremarkable in appearance. Bilateral adrenal glands are normal in appearance. Stomach/Bowel: Normal appearance of the stomach. No pathologic dilatation of small bowel or colon. Several colonic diverticulae are noted, without surrounding  inflammatory changes to suggest an acute diverticulitis at this time. Normal appendix. Vascular/Lymphatic: Extensive atherosclerosis throughout the abdominal and pelvic vasculature, with vascular findings and measurements pertinent to potential TAVR procedure, as detailed below. In addition, there is mild aneurysmal dilatation of the right common iliac artery which measures up to 1.8 cm in diameter. No lymphadenopathy noted in the abdomen or pelvis. Reproductive: Prostate gland and seminal vesicles are unremarkable in appearance. Other: No significant volume of ascites.  No pneumoperitoneum. Musculoskeletal: There are no aggressive appearing lytic or blastic lesions noted in the visualized portions of the skeleton. VASCULAR MEASUREMENTS PERTINENT TO TAVR: AORTA: Minimal Aortic Diameter -  12 x 13 mm Severity of Aortic Calcification -  severe RIGHT PELVIS: Right Common Iliac Artery - Minimal Diameter - 6.4 x 6.0 mm Tortuosity - mild Calcification - severe Comment - There is a right common iliac artery stent in place, with the narrowest luminal diameter of 7.8 x 6.9 mm. Right External Iliac Artery - Minimal Diameter - 8.2 x 5.2 mm Tortuosity - moderate Calcification - mild Right Common Femoral Artery - Minimal Diameter - 6.2 x 8.2 mm Tortuosity - mild Calcification - mild-to-moderate LEFT PELVIS: Left Common Iliac Artery - Minimal Diameter - 4.7 x 4.3 mm Tortuosity - mild Calcification - severe Comment - There is a left common iliac artery stent in place, with the narrowest luminal diameter of 4.7 x 4.3 mm. Left External Iliac Artery - Minimal Diameter - 6.5 x 7.5 mm Tortuosity - moderate Calcification - mild Left Common Femoral Artery - Minimal Diameter - 8.1 x 6.4 mm Tortuosity - mild Calcification - mild-to-moderate Review of the MIP images confirms the above findings. IMPRESSION: 1. Vascular findings and measurements pertinent to potential TAVR procedure, as detailed above. This patient appears to have suitable  pelvic arterial access on the right side. However, bilateral common iliac artery stents are present, as discussed above. 2. Thickening calcification of the aortic valve, compatible with the reported  clinical history of severe aortic stenosis. 3. Atherosclerosis, including left main and 3 vessel coronary artery disease. Status post median sternotomy for CABG, including LIMA to the LAD. 4. Colonic diverticulosis without evidence of acute diverticulitis at this time. 5. Additional incidental findings, as above. Electronically Signed   By: Vinnie Langton M.D.   On: 05/07/2016 13:05    Disposition   Pt is being discharged home today in good condition.  Follow-up Plans & Appointments    Follow-up Information    Follow up with [facility] On 07/09/2016.   Specialty:  Cardiology   Why:  9:30AM. Repeat echocardiogram to check valve after recent valvular surgery   Contact information:   [address] [place] [phone]      Follow up with Sherren Mocha, MD On 07/09/2016.   Specialty:  Cardiology   Why:  10:30AM. Cardiology followup   Contact information:   [id number] [place] [address] [phone]       Schedule an appointment as soon as possible for a visit with BAUCOM, JENNY B, PA-C.   Specialty:  Physician Assistant   Contact information:   [address] [place] [address] [phone]        Discharge Medications   Current Discharge Medication List    START taking these medications   Details  clopidogrel (PLAVIX) 75 MG tablet Take 1 tablet (75 mg total) by mouth daily with breakfast. Qty: 90 tablet, Refills: 3      CONTINUE these medications which have NOT CHANGED   Details  acetaminophen (TYLENOL) 325 MG tablet Take 650 mg by mouth daily as needed for mild pain or headache.     aspirin EC 81 MG tablet Take 1 tablet (81 mg total) by mouth daily. Qty: 90 tablet, Refills: 3      atorvastatin (LIPITOR) 80 MG tablet TAKE ONE TABLET BY MOUTH ONCE DAILY Qty: 90 tablet, Refills: 3    isosorbide dinitrate (ISORDIL) 10 MG tablet Take 2 tablets (20 mg total) by mouth as directed. TAKE 2 TABS IN THE MORNING AND 1 TAB IN THE PM Qty: 270 tablet, Refills: 3   Associated Diagnoses: Exertional chest pain    lisinopril (PRINIVIL,ZESTRIL) 20 MG tablet TAKE ONE TABLET BY MOUTH ONCE DAILY Qty: 90 tablet, Refills: 3    metoprolol tartrate (LOPRESSOR) 25 MG tablet Take 0.5 tablets (12.5 mg total) by mouth at bedtime.   Associated Diagnoses: Aortic stenosis    Multiple Vitamins-Minerals (PRESERVISION AREDS PO) Take 1 tablet by mouth 2 (two) times daily.    nitroGLYCERIN (NITROSTAT) 0.4 MG SL tablet Place 0.4 mg under the tongue every 5 (five) minutes as needed for chest pain.    pantoprazole (PROTONIX) 40 MG tablet Take 1 tablet (40 mg total) by mouth daily. Qty: 90 tablet, Refills: 3    tamsulosin (FLOMAX) 0.4 MG CAPS capsule Take 1 capsule (0.4 mg total) by mouth daily. Qty: 30 capsule, Refills: 3          Outstanding Labs/Studies   CBC on followup  Duration of Discharge Encounter   Greater than 30 minutes including physician time.  Signed, Almyra Deforest PA-C 06/03/2016, 10:01 AM

## 2016-06-03 NOTE — Progress Notes (Addendum)
2 Days Post-Op Procedure(s) (LRB): TRANSCATHETER AORTIC VALVE REPLACEMENT, TRANSFEMORAL (N/A) TRANSESOPHAGEAL ECHOCARDIOGRAM (TEE) (N/A) Subjective:  Feels well. Has ambulated without difficulty.  Objective: Vital signs in last 24 hours: Temp:  [97.6 F (36.4 C)-98.8 F (37.1 C)] 98.8 F (37.1 C) (06/22 0606) Pulse Rate:  [50-66] 59 (06/22 0606) Cardiac Rhythm:  [-] Normal sinus rhythm;Bundle branch block (06/21 1900) Resp:  [15-19] 16 (06/22 0606) BP: (100-162)/(37-75) 146/55 mmHg (06/22 0606) SpO2:  [92 %-100 %] 100 % (06/22 0606) Weight:  [84.369 kg (186 lb)] 84.369 kg (186 lb) (06/22 0606)  Hemodynamic parameters for last 24 hours:    Intake/Output from previous day: 06/21 0701 - 06/22 0700 In: 150 [I.V.:100; IV Piggyback:50] Out: 3035 [Urine:3035] Intake/Output this shift: Total I/O In: -  Out: 360 [Urine:360]  General appearance: alert and cooperative Neurologic: intact Heart: regular rate and rhythm, S1, S2 normal, no murmur, click, rub or gallop Lungs: clear to auscultation bilaterally Wound: mild ecchymosis left groin but no significant hematoma. right groin site looks fine.  Lab Results:  Recent Labs  06/01/16 1730 06/03/16 0242  WBC 4.6 5.8  HGB 11.0* 11.1*  HCT 34.3* 35.1*  PLT 80* 78*   BMET:  Recent Labs  06/01/16 1611 06/01/16 1724 06/03/16 0242  NA 141 142 137  K 3.6 3.7 3.6  CL 104  --  104  CO2  --   --  28  GLUCOSE 103* 91 121*  BUN 6  --  8  CREATININE 0.80  --  0.94  CALCIUM  --   --  8.7*    PT/INR: No results for input(s): LABPROT, INR in the last 72 hours. ABG    Component Value Date/Time   PHART 7.378 06/01/2016 1724   HCO3 26.4* 06/01/2016 1724   TCO2 28 06/01/2016 1724   ACIDBASEDEF 1.0 05/26/2016 1355   O2SAT 90.0 06/01/2016 1724   CBG (last 3)  No results for input(s): GLUCAP in the last 72 hours.   *[facility]*  [address], [place] [address]  [phone]  ------------------------------------------------------------------- Transthoracic Echocardiography  Patient: Matthew, [name] MR #: [id number] Study Date: 06/02/2016 Gender: M Age: 80 Height: 175.3 cm Weight: 87.7 kg BSA: 2.09 m^2 Pt. Status: Room: 2S16C  ADMITTING Sherren Mocha, MD ATTENDING Sherren Mocha, MD ORDERING Sherren Mocha, MD REFERRING Sherren Mocha, MD SONOGRAPHER Matthew [name], RDCS PERFORMING [facility], Inpatient  cc:  ------------------------------------------------------------------- LV EF: 50% - 55%  ------------------------------------------------------------------- Indications: Aortic stenosis 424.1.  ------------------------------------------------------------------- History: PMH: S/P TAVR. Coronary artery disease. Risk factors: Dyslipidemia.  ------------------------------------------------------------------- Study Conclusions  - Left ventricle: Posterior basal akinesis The cavity size was  mildly dilated. Systolic function was normal. The estimated  ejection fraction was in the range of 50% to 55%. Doppler  parameters are consistent with both elevated ventricular  end-diastolic filling pressure and elevated left atrial filling  pressure. - Aortic valve: Normal appearing 26 mm Sapien 3 valve with no  significant perivalvular regurgitation post TAVR. Valve area  (VTI): 0.81 cm^2. Valve area (Vmax): 0.83 cm^2. Valve area  (Vmean): 0.85 cm^2. - Left atrium: The atrium was moderately dilated. - Atrial septum: No defect or patent foramen ovale was identified.  Transthoracic echocardiography. M-mode, complete 2D, spectral Doppler, and color Doppler. Birthdate: Patient birthdate: 17-Apr-1936. Age: Patient is 80 yr old. Sex: Gender: male. BMI: 28.5 kg/m^2. Blood  pressure: 157/62 Patient status: Inpatient. Study date: Study date: 06/02/2016. Study time: 10:30 AM. Location: ICU/CCU  -------------------------------------------------------------------  -------------------------------------------------------------------  Left ventricle: Posterior basal akinesis The cavity size was mildly dilated. Systolic function was normal. The estimated ejection fraction was in the range of 50% to 55%. Doppler parameters are consistent with both elevated ventricular end-diastolic filling pressure and elevated left atrial filling pressure.  ------------------------------------------------------------------- Aortic valve: Normal appearing 26 mm Sapien 3 valve with no significant perivalvular regurgitation post TAVR. Doppler: VTI ratio of LVOT to aortic valve: 0.26. Valve area (VTI): 0.81 cm^2. Indexed valve area (VTI): 0.39 cm^2/m^2. Peak velocity ratio of LVOT to aortic valve: 0.26. Valve area (Vmax): 0.83 cm^2. Indexed valve area (Vmax): 0.4 cm^2/m^2. Mean velocity ratio of LVOT to aortic valve: 0.27. Valve area (Vmean): 0.85 cm^2. Indexed valve area (Vmean): 0.41 cm^2/m^2. Mean gradient (S): 14 mm Hg. Peak gradient (S): 29 mm Hg.  ------------------------------------------------------------------- Mitral valve: Doppler: There was trivial regurgitation. Peak gradient (D): 5 mm Hg.  ------------------------------------------------------------------- Left atrium: The atrium was moderately dilated.  ------------------------------------------------------------------- Atrial septum: No defect or patent foramen ovale was identified.  ------------------------------------------------------------------- Right ventricle: The cavity size was normal. Wall thickness was normal. Systolic function was normal.  ------------------------------------------------------------------- Pulmonic valve: Doppler: There was mild  regurgitation.  ------------------------------------------------------------------- Tricuspid valve: Doppler: There was mild regurgitation.  ------------------------------------------------------------------- Right atrium: The atrium was normal in size.  ------------------------------------------------------------------- Pericardium: The pericardium was normal in appearance.  ------------------------------------------------------------------- Systemic veins: Inferior vena cava: The vessel was normal in size. The respirophasic diameter changes were in the normal range (>= 50%), consistent with normal central venous pressure.  ------------------------------------------------------------------- Measurements  Left ventricle Value Reference LV ID, ED, PLAX chordal (H) 52.2 mm 43 - 52 LV ID, ES, PLAX chordal (H) 42.3 mm 23 - 38 LV fx shortening, PLAX chordal (L) 19 % >=29 LV PW thickness, ED 11.5 mm --------- IVS/LV PW ratio, ED 1.02 <=1.3 Stroke volume, 2D 51 ml --------- Stroke volume/bsa, 2D 24 ml/m^2 --------- LV ejection fraction, 1-p A4C 57 % --------- LV end-diastolic volume, 2-p [id number] ml --------- LV end-systolic volume, 2-p 61 ml --------- LV ejection fraction, 2-p 47 % --------- Stroke volume, 2-p 54 ml --------- LV end-diastolic volume/bsa, 2-p 55 ml/m^2 --------- LV end-systolic volume/bsa, 2-p 29 ml/m^2 --------- Stroke volume/bsa, 2-p 25.8 ml/m^2 --------- LV e&', lateral 7.18 cm/s --------- LV E/e&',  lateral 16.02 --------- LV s&', lateral 7 cm/s --------- LV e&', medial 4.68 cm/s --------- LV E/e&', medial 24.57 --------- LV e&', average 5.93 cm/s --------- LV E/e&', average 19.39 ---------  Ventricular septum Value Reference IVS thickness, ED 11.7 mm ---------  LVOT Value Reference LVOT ID, S 20 mm --------- LVOT area 3.14 cm^2 --------- LVOT peak velocity, S 70.9 cm/s --------- LVOT mean velocity, S 45.5 cm/s --------- LVOT VTI, S 16.2 cm ---------  Aortic valve Value Reference Aortic valve peak velocity, S 268 cm/s --------- Aortic valve mean velocity, S 168 cm/s --------- Aortic valve VTI, S 62.7 cm --------- Aortic mean gradient, S 14 mm Hg --------- Aortic peak gradient, S 29 mm Hg --------- VTI ratio, LVOT/AV 0.26 --------- Aortic valve area, VTI 0.81 cm^2 --------- Aortic valve area/bsa, VTI 0.39 cm^2/m^2 --------- Velocity ratio, peak, LVOT/AV 0.26 --------- Aortic valve area, peak velocity 0.83 cm^2 --------- Aortic valve area/bsa, peak 0.4 cm^2/m^2 --------- velocity Velocity ratio, mean, LVOT/AV 0.27 --------- Aortic valve area, mean velocity 0.85 cm^2  --------- Aortic valve area/bsa, mean 0.41 cm^2/m^2 --------- velocity  Aorta Value Reference Aortic root ID, ED 25 mm ---------  Left atrium Value Reference LA ID, A-P, ES 46 mm --------- LA ID/bsa, A-P 2.2 cm/m^2 <=2.2 LA volume, S  88.8 ml --------- LA volume/bsa, S 42.6 ml/m^2 --------- LA volume, ES, 1-p A4C 92.1 ml --------- LA volume/bsa, ES, 1-p A4C 44.1 ml/m^2 --------- LA volume, ES, 1-p A2C 77.3 ml --------- LA volume/bsa, ES, 1-p A2C 37.1 ml/m^2 ---------  Mitral valve Value Reference Mitral E-wave peak velocity 115 cm/s --------- Mitral A-wave peak velocity 72.8 cm/s --------- Mitral deceleration time (H) 465 ms 150 - 230 Mitral peak gradient, D 5 mm Hg --------- Mitral E/A ratio, peak 1.6 ---------  Pulmonary arteries Value Reference PA pressure, S, DP 21 mm Hg <=30  Tricuspid valve Value Reference Tricuspid regurg peak velocity 214 cm/s --------- Tricuspid peak RV-RA gradient 18 mm Hg ---------  Systemic veins Value Reference Estimated CVP 3 mm Hg ---------  Right ventricle Value Reference TAPSE 25.9 mm --------- RV pressure, S, DP  21 mm Hg <=30 RV s&', lateral, S 12 cm/s ---------  Legend: (L) and (H) mark values outside specified reference range.  ------------------------------------------------------------------- Prepared and Electronically Authenticated by  Jenkins Rouge, M.D. 2017-06-21T12:46:00       Assessment/Plan: S/P Procedure(s) (LRB): TRANSCATHETER AORTIC VALVE REPLACEMENT, TRANSFEMORAL (N/A) TRANSESOPHAGEAL ECHOCARDIOGRAM (TEE) (N/A)  He is doing well POD2 Postop thrombocytopenia probably due to heparin and will resolve. Renal function stable. Hgb stable. Echo yesterday looks good with no paravalvular leak. Mean gradient recorded as 14 mm Hg which seems a little high but valve looks like it is functioning normally. Will follow up at 30 days. Plan home today with one month follow up with Dr. Burt Knack.   LOS: 2 days    Matthew [name] 06/03/2016

## 2016-06-04 MED FILL — Insulin Regular (Human) Inj 100 Unit/ML: INTRAMUSCULAR | Qty: 250 | Status: AC

## 2016-07-09 ENCOUNTER — Ambulatory Visit (INDEPENDENT_AMBULATORY_CARE_PROVIDER_SITE_OTHER): Payer: Medicare Other | Admitting: Cardiovascular Disease

## 2016-07-09 ENCOUNTER — Encounter (INDEPENDENT_AMBULATORY_CARE_PROVIDER_SITE_OTHER): Payer: Self-pay

## 2016-07-09 ENCOUNTER — Ambulatory Visit (HOSPITAL_COMMUNITY): Payer: Medicare Other | Attending: Internal Medicine

## 2016-07-09 VITALS — BP 180/72 | HR 52 | Ht 69.0 in | Wt 184.8 lb

## 2016-07-09 DIAGNOSIS — I350 Nonrheumatic aortic (valve) stenosis: Secondary | ICD-10-CM | POA: Insufficient documentation

## 2016-07-09 DIAGNOSIS — I253 Aneurysm of heart: Secondary | ICD-10-CM | POA: Insufficient documentation

## 2016-07-09 DIAGNOSIS — R29898 Other symptoms and signs involving the musculoskeletal system: Secondary | ICD-10-CM | POA: Diagnosis not present

## 2016-07-09 DIAGNOSIS — Z954 Presence of other heart-valve replacement: Secondary | ICD-10-CM | POA: Diagnosis not present

## 2016-07-09 DIAGNOSIS — Z952 Presence of prosthetic heart valve: Secondary | ICD-10-CM

## 2016-07-09 DIAGNOSIS — I119 Hypertensive heart disease without heart failure: Secondary | ICD-10-CM | POA: Insufficient documentation

## 2016-07-09 DIAGNOSIS — E785 Hyperlipidemia, unspecified: Secondary | ICD-10-CM | POA: Insufficient documentation

## 2016-07-09 DIAGNOSIS — I059 Rheumatic mitral valve disease, unspecified: Secondary | ICD-10-CM | POA: Diagnosis not present

## 2016-07-09 MED ORDER — PERFLUTREN LIPID MICROSPHERE
1.0000 mL | INTRAVENOUS | Status: AC | PRN
  Administered 2016-07-09: 2 mL via INTRAVENOUS

## 2016-07-09 NOTE — Patient Instructions (Addendum)
Medication Instructions:  Your physician recommends that you continue on your current medications as directed. Please refer to the Current Medication list given to you today.  I contacted the pt's daughter [name] and she will clarify if the pt is taking HCTZ 25mg  daily.  If the pt is not then Dr Burt Knack would like to resume this medication. She will touch base with the office next week.   Labwork: No new orders.   Testing/Procedures: Your physician has requested that you have an echocardiogram in 1 YEAR. Echocardiography is a painless test that uses sound waves to create images of your heart. It provides your doctor with information about the size and shape of your heart and how well your heart's chambers and valves are working. This procedure takes approximately one hour. There are no restrictions for this procedure.  Follow-Up: Your physician recommends that you schedule a follow-up appointment in: 3 MONTHS with Dr Fletcher Anon [facility] office)  Your physician wants you to follow-up in: 1 YEAR with Dr Burt Knack.  You will receive a reminder letter in the mail two months in advance. If you don't receive a letter, please call our office to schedule the follow-up appointment.   Any Other Special Instructions Will Be Listed Below (If Applicable).  Your physician discussed the importance of taking an antibiotic prior to any dental, gastrointestinal, genitourinary procedures to prevent damage to the heart valves from infection.     If you need a refill on your cardiac medications before your next appointment, please call your pharmacy.

## 2016-07-09 NOTE — Progress Notes (Signed)
Cardiology Office Note Date:  07/11/2016   ID:  YELTSIN HISHMEH, DOB [birth year], MRN [id number]  PCP:  Antionette Fairy PA-C  Cardiologist:  Dr Fletcher Anon  Chief Complaint  Patient presents with  . Follow-up    S/P TAVR    History of Present Illness: Matthew [name] is a 80 y.o. male who presents for follow-up of aortic valve disease. He underwent TAVR via a percutaneous right transfemoral approach 06/01/2016 with a 26 mm Sapien 3 transcatheter heart valve. He had previously undergone initial CABG in 1991 and redo CABG in 2004. He had presented with progressive shortness of breath attributed to severe aortic stenosis.  The patient has been doing very well since he underwent TAVR. His breathing is improved. He's had no further chest discomfort. He was out on his riding lawnmower the week after surgery. He denies edema, orthopnea, or PND.  Past Medical History:  Diagnosis Date  . AS (aortic stenosis)    a. Echo 6/10: EF 55% mild AS; b. echo 06/2015; EF 55-60%, GR1DD, moderate AS, Peak velocity (S): 346 cm/s. Mean gradientS): 28 mm Hg. Peak gradient (S): 48 mm Hg. Valve area (VTI): 1.18 cm2;   c. Echo 4/17 - mild LVH, EF 55-60%, no RWMA, Gr 1 DD, mod to severe AS (mean 28 mmHg, peak 46 mmHg), mild LAE  . CAD (coronary artery disease)    a. MI 1996 w/ CABG 1996; b.redo CABG 06/2003; c. Myoview 7/09: EF 54% inferobasal infarct, no ischemia. Myoview 6/10 EF 43% inf wall infarct. no ischemia; c. cath 8/16 s/p DES to VG-OM2. OTW 3VD w/ patent VG->RPDA, VG->OM3,  & LIMA->LAD.  EF 55-65%.  . Carotid bruit    2009 0-39% on dopplers bilatrally  . GERD (gastroesophageal reflux disease)   . History of kidney stones   . HOH (hard of hearing)   . HTN (hypertension)   . Hyperlipidemia   . Myocardial infarction ([place]) 1996  . Nephrolithiasis   . OSA (obstructive sleep apnea)   . PAD (peripheral artery disease) ([place]) 02/2014   Subtotal occlusion of right common iliac artery and 70% stenosis in the left common  iliac artery. Status post bilateral kissing stent placement. 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 Shortness of breath    with exertion  . Thrombocytopenia ([place])     Past Surgical History:  Procedure Laterality Date  . ABDOMINAL AORTAGRAM N/A 03/06/2014   Procedure: ABDOMINAL Maxcine Ham;  Surgeon: Wellington Hampshire, MD;  Location:  [facility];  Service: Cardiovascular;  Laterality: N/A;  . CARDIAC CATHETERIZATION  02/2014   Severe three-vessel coronary artery disease with patent grafts.   Marland Kitchen CARDIAC CATHETERIZATION N/A 07/31/2015   Procedure: Right/Left Heart Cath and Coronary/Graft Angiography;  Surgeon: Wellington Hampshire, MD;  Location: [place] [facility];  Service: Cardiovascular;  Laterality: N/A;  . CARDIAC CATHETERIZATION N/A 07/31/2015   Procedure: Coronary Stent Intervention;  Surgeon: Wellington Hampshire, MD;  Location: [place] CV LAB;  Service: Cardiovascular;  Laterality: N/A;  . CARDIAC CATHETERIZATION N/A 05/26/2016   Procedure: Right/Left Heart Cath and Coronary Angiography;  Surgeon: Wellington Hampshire, MD;  Location: [place] CV LAB;  Service: Cardiovascular;  Laterality: N/A;  . CORONARY ARTERY BYPASS GRAFT  1991   at [facility]. Redone 2004-3 vessels 1st time and 4 second time  . CYSTO  2/3 times for kidney stones  . INGUINAL HERNIA REPAIR  11/10/2011   Procedure: HERNIA REPAIR INGUINAL ADULT;  Surgeon: Donato Heinz;  Location: [facility];  Service: General;  Laterality: Right;  . LEFT HEART CATHETERIZATION WITH CORONARY/GRAFT ANGIOGRAM N/A 03/06/2014   Procedure: LEFT HEART CATHETERIZATION WITH Beatrix Fetters;  Surgeon: Wellington Hampshire, MD;  Location: [place] C[facility] LAB;  Service: Cardiovascular;  Laterality: N/A;  . STOMACH SURGERY     removal of gastric ulcers  . TEE WITHOUT CARDIOVERSION N/A 06/01/2016     Procedure: TRANSESOPHAGEAL ECHOCARDIOGRAM (TEE);  Surgeon: Sherren Mocha, MD;  Location: [place];  Service: Open Heart Surgery;  Laterality: N/A;  . TRANSCATHETER AORTIC VALVE REPLACEMENT, TRANSFEMORAL N/A 06/01/2016   Procedure: TRANSCATHETER AORTIC VALVE REPLACEMENT, TRANSFEMORAL;  Surgeon: Sherren Mocha, MD;  Location: [place];  Service: Open Heart Surgery;  Laterality: N/A;    Current Outpatient Prescriptions  Medication Sig Dispense Refill  . acetaminophen (TYLENOL) 325 MG tablet Take 650 mg by mouth daily as needed for mild pain or headache.     Marland Kitchen aspirin EC 81 MG tablet Take 1 tablet (81 mg total) by mouth daily. 90 tablet 3  . atorvastatin (LIPITOR) 80 MG tablet TAKE ONE TABLET BY MOUTH ONCE DAILY 90 tablet 3  . clopidogrel (PLAVIX) 75 MG tablet Take 1 tablet (75 mg total) by mouth daily with breakfast. 90 tablet 3  . isosorbide dinitrate (ISORDIL) 10 MG tablet Take 2 tablets (20 mg total) by mouth as directed. TAKE 2 TABS IN THE MORNING AND 1 TAB IN THE PM (Patient taking differently: Take 10-20 mg by mouth as directed. TAKE 2 TABS IN THE MORNING AND 1 TAB IN THE PM) 270 tablet 3  . lisinopril (PRINIVIL,ZESTRIL) 20 MG tablet TAKE ONE TABLET BY MOUTH ONCE DAILY 90 tablet 3  . metoprolol tartrate (LOPRESSOR) 25 MG tablet Take 0.5 tablets (12.5 mg total) by mouth at bedtime.    . Multiple Vitamins-Minerals (PRESERVISION AREDS PO) Take 1 tablet by mouth 2 (two) times daily.    . nitroGLYCERIN (NITROSTAT) 0.4 MG SL tablet Place 0.4 mg under the tongue every 5 (five) minutes as needed for chest pain.    . pantoprazole (PROTONIX) 40 MG tablet Take 1 tablet (40 mg total) by mouth daily. 90 tablet 3  . tamsulosin (FLOMAX) 0.4 MG CAPS capsule Take 1 capsule (0.4 mg total) by mouth daily. 30 capsule 3   No current facility-administered medications for this visit.     Allergies:   Review of patient's allergies indicates no known allergies.   Social History:  The patient  reports that he quit  smoking about 26 years ago. His smoking use included Cigarettes. He has a 50.00 pack-year smoking history. He has never used smokeless tobacco. He reports that he does not drink alcohol or use drugs.   Family History:  The patient's  family history includes Heart disease in his father and mother.    ROS:  Please see the history of present illness.  All other systems are reviewed and negative.    PHYSICAL EXAM: VS:  BP (!) 180/72   Pulse (!) 52   Ht 5\' 9"  (1.753 m)   Wt 184 lb 12.8 oz (83.8 kg)   BMI 27.29 kg/m  , BMI Body mass index is 27.29 kg/m.  Blood pressure on my recheck is 164/72 GEN: Well nourished, well developed, in no acute distress  HEENT: normal  Neck: no JVD, no masses.  Cardiac: RRR with 2/6 SEM at  the RUSB               Respiratory:  clear to auscultation bilaterally, normal work of breathing GI: soft, nontender, nondistended, + BS MS: no deformity or atrophy  Ext: no pretibial edema, pedal pulses 2+= bilaterally Skin: warm and dry, no rash Neuro:  Strength and sensation are intact Psych: euthymic mood, full affect  EKG:  EKG is not ordered today.  Recent Labs: 05/28/2016: ALT 13 06/03/2016: BUN 8; Creatinine, Ser 0.94; Hemoglobin 11.1; Platelets 78; Potassium 3.6; Sodium 137   Lipid Panel     Component Value Date/Time   CHOL 149 08/11/2015 1402   TRIG 224 (H) 08/11/2015 1402   HDL 41 08/11/2015 1402   CHOLHDL 3.6 08/11/2015 1402   CHOLHDL 3.3 Ratio 05/19/2009 2134   VLDL 18 05/19/2009 2134   LDLCALC 63 08/11/2015 1402      Wt Readings from Last 3 Encounters:  07/09/16 184 lb 12.8 oz (83.8 kg)  06/03/16 186 lb (84.4 kg)  05/28/16 191 lb 6.4 oz (86.8 kg)     Cardiac Studies Reviewed: 2D Echo 06-02-2016: Left ventricle:  Posterior basal akinesis The cavity size was mildly dilated. Systolic function was normal. The estimated ejection fraction was in the range of 50% to 55%. Doppler parameters are consistent with both elevated  ventricular end-diastolic filling pressure and elevated left atrial filling pressure.  ------------------------------------------------------------------- Aortic valve:  Normal appearing 26 mm Sapien 3 valve with no significant perivalvular regurgitation post TAVR.  Doppler:     VTI ratio of LVOT to aortic valve: 0.26. Valve area (VTI): 0.81 cm^2. Indexed valve area (VTI): 0.39 cm^2/m^2. Peak velocity ratio of LVOT to aortic valve: 0.26. Valve area (Vmax): 0.83 cm^2. Indexed valve area (Vmax): 0.4 cm^2/m^2. Mean velocity ratio of LVOT to aortic valve: 0.27. Valve area (Vmean): 0.85 cm^2. Indexed valve area (Vmean): 0.41 cm^2/m^2.    Mean gradient (S): 14 mm Hg. Peak gradient (S): 29 mm Hg.  ------------------------------------------------------------------- Mitral valve:   Doppler:  There was trivial regurgitation.    Peak gradient (D): 5 mm Hg.  ------------------------------------------------------------------- Left atrium:  The atrium was moderately dilated.  ------------------------------------------------------------------- Atrial septum:  No defect or patent foramen ovale was identified.   ------------------------------------------------------------------- Right ventricle:  The cavity size was normal. Wall thickness was normal. Systolic function was normal.  ------------------------------------------------------------------- Pulmonic valve:    Doppler:  There was mild regurgitation.  ------------------------------------------------------------------- Tricuspid valve:   Doppler:  There was mild regurgitation.  ------------------------------------------------------------------- Right atrium:  The atrium was normal in size.  ------------------------------------------------------------------- Pericardium:  The pericardium was normal in appearance.  ------------------------------------------------------------------- Systemic veins: Inferior vena cava: The vessel  was normal in size. The respirophasic diameter changes were in the normal range (>= 50%), consistent with normal central venous pressure.  ASSESSMENT AND PLAN: Aortic valve disease now one month out from TAVR: Patient with NYHA functional class I symptoms. Echo is reviewed with normal transcatheter heart valve function.  Medications are reviewed and he will continue dual antiplatelet therapy with aspirin and Plavix. SBE prophylaxis is reviewed. Patient on an ACE inhibitor, beta blocker, nitrate. Follow-up Dr. Fletcher Anon in 3 months and I will see him back in one year with an echocardiogram for his TAVR follow-up.  Current medicines are reviewed with the patient today.  The patient does not have concerns regarding medicines.  Labs/ tests ordered today include:  No orders of the defined types were placed in this encounter.   Disposition:   FU Dr Fletcher Anon 3 months, Valve  clinic 1 year with a repeat echo  Signed, Sherren Mocha, MD  07/11/2016 9:24 AM    Matthew [name], [place], [place]  [address] Phone: [phone]; Fax: [phone]

## 2016-07-11 ENCOUNTER — Encounter: Payer: Self-pay | Admitting: Cardiovascular Disease

## 2016-07-13 ENCOUNTER — Telehealth: Payer: Self-pay | Admitting: Cardiovascular Disease

## 2016-07-13 MED ORDER — HYDROCHLOROTHIAZIDE 25 MG PO TABS: 25.0000 mg | ORAL_TABLET | Freq: Every day | ORAL | 3 refills | Status: DC

## 2016-07-13 NOTE — Telephone Encounter (Signed)
New Message  Pt c/o medication issue:  1. Name of Medication: Metoprolol and htcz  2. How are you currently taking this medication (dosage and times per day)? 25mg  pt daughter states did not know mg for htcz  3. Are you having a reaction (difficulty breathing--STAT)? bp continues to increase   4. What is your medication issue? Pt daughter states pt was taken off ed but wants to know if he needs to state both meds back. Please cll back to discuss

## 2016-07-13 NOTE — Telephone Encounter (Signed)
Agree with plan. thanks

## 2016-07-13 NOTE — Telephone Encounter (Signed)
I spoke with the pt's daughter and the pt is taking Metoprolol Tartrate 25mg  one-half tablet by mouth daily at bedtime.  The pt has not been taking HCTZ since discharge and Matthew [name] thinks this was stopped prior to surgery.  I reviewed chart and Dr Fletcher Anon did stop HCTZ during 04/12/16 OV due to hypotension and dizziness.  Due to elevated BP post TAVR we will restart HCTZ.  Rx sent to pharmacy. I advised Matthew [name] to monitor the pt's BP and pulse and notify the office if he has low BP, pulse less than 50 or dizziness. Matthew [name] agreed with plan.

## 2016-07-21 ENCOUNTER — Telehealth: Payer: Self-pay | Admitting: Cardiovascular Disease

## 2016-07-21 NOTE — Telephone Encounter (Signed)
S/w pt daughter, [name], who reports pt wife, Matthew [name], states pt has lost 18 pounds since valve replacement in June and looks "ashy".  He has been eating much healthier since surgery which could be the reason for weight loss. He is following a heart healthy diet, eating oatmeal instead of [employer] sausage biscuits. He was seen for f/u w/Dr. Burt Knack July 28 and was noted " The patient has been doing very well since he underwent TAVR. His breathing is improved. He's had no further chest discomfort. He was out on his riding lawnmower the week after surgery. He denies edema, orthopnea, or PND."  He walks to feed the chickens now instead of riding the 4-wheeler and has been mowing the lawn. Pt recorded weight on 5/22 was 192lbs and 184lbs on 7/28, reflecting an 8lb loss. [name] will clarify with pt wife if she stated 18lb or 8lb loss. Offered to contact wife but she works in a [employer] and can't take calls during the day. [name] will call back tomorrow with an update. Pt needs 3 month f/u. Will schedule tomorrow.

## 2016-07-21 NOTE — Telephone Encounter (Signed)
Pt daughter calling having some concerns Pt had his aorta valve replaced in June 20 th Pt mother states pt lost 18 pounds since then She was telling her he looks "ashy" She is concerned about this On a side note Pt is not going to [employer] and eating 1/2 sausage biscuit Since then he's been eating like he's suppose to  Was a size 36 now he is 34 in pants Would like to make sure things are okay Pt did see Dr Burt Knack in July But would like to make sure this is not something to be concerned about Please call back

## 2016-07-26 NOTE — Telephone Encounter (Signed)
Called patient to make 74m fu  Pt is coming nov 18th to see Dr Fletcher Anon

## 2016-07-31 ENCOUNTER — Other Ambulatory Visit: Payer: Self-pay | Admitting: Nurse Practitioner

## 2016-10-06 ENCOUNTER — Other Ambulatory Visit: Payer: Self-pay | Admitting: Cardiovascular Disease

## 2016-10-29 ENCOUNTER — Ambulatory Visit (INDEPENDENT_AMBULATORY_CARE_PROVIDER_SITE_OTHER): Payer: Medicare Other | Admitting: Cardiovascular Disease

## 2016-10-29 ENCOUNTER — Encounter: Payer: Self-pay | Admitting: Cardiovascular Disease

## 2016-10-29 VITALS — BP 104/52 | HR 67 | Ht 69.0 in | Wt 187.0 lb

## 2016-10-29 DIAGNOSIS — I209 Angina pectoris, unspecified: Secondary | ICD-10-CM | POA: Diagnosis not present

## 2016-10-29 DIAGNOSIS — I10 Essential (primary) hypertension: Secondary | ICD-10-CM

## 2016-10-29 DIAGNOSIS — I359 Nonrheumatic aortic valve disorder, unspecified: Secondary | ICD-10-CM

## 2016-10-29 DIAGNOSIS — R0602 Shortness of breath: Secondary | ICD-10-CM | POA: Diagnosis not present

## 2016-10-29 NOTE — Patient Instructions (Signed)
Medication Instructions: Continue same medications.   Labwork: None.   Procedures/Testing: None.   Follow-Up: 6 months with Dr. Arida.   Any Additional Special Instructions Will Be Listed Below (If Applicable).     If you need a refill on your cardiac medications before your next appointment, please call your pharmacy.   

## 2016-10-29 NOTE — Progress Notes (Signed)
Cardiology Office Note   Date:  10/29/2016   ID:  Matthew [name], DOB Apr 20, 1936, MRN [id number]  PCP:  Antionette Fairy, PA-C  Cardiologist:   [name], MD   Chief Complaint  Patient presents with  . other    3 month follow up s/p aortic valve replacement. Meds reviewed by the pt. verbally. Pt. c/o shortness of breath and chest pain.       History of Present Illness: Matthew [name] is a 80 y.o. male who presents for A follow-up visit regarding coronary artery disease , aortic stenosis and peripheral arterial disease. He is status post CABG twice in 1991 and 2004. He is status post bilateral common iliac artery kissing stent placement in 2015. He had symptoms of unstable angina in August 2016. I proceeded with cardiac catheterization which showed severe underlying three-vessel coronary artery disease with patent LIMA to LAD, SVG to OM 2, SVG to OM 3, SVG to right PDA and RIMA to diagonal. There was significant thrombotic lesion in SVG to OM 2 which was treated successfully with drug-eluting stent placement.  His aortic stenosis progressed and ultimately he underwent TAVR in June 2017. Cardiac catheterization before that showed patent grafts. He has been doing well since then with significant improvement in anginal symptoms. He continues to be active and has only occasional exertional chest discomfort. No leg claudication.    Past Medical History:  Diagnosis Date  . AS (aortic stenosis)    a. Echo 6/10: EF 55% mild AS; b. echo 06/2015; EF 55-60%, GR1DD, moderate AS, Peak velocity (S): 346 cm/s. Mean gradientS): 28 mm Hg. Peak gradient (S): 48 mm Hg. Valve area (VTI): 1.18 cm2;   c. Echo 4/17 - mild LVH, EF 55-60%, no RWMA, Gr 1 DD, mod to severe AS (mean 28 mmHg, peak 46 mmHg), mild LAE  . CAD (coronary artery disease)    a. MI 1996 w/ CABG 1996; b.redo CABG 06/2003; c. Myoview 7/09: EF 54% inferobasal infarct, no ischemia. Myoview 6/10 EF 43% inf wall infarct. no ischemia; c.  cath 8/16 s/p DES to VG-OM2. OTW 3VD w/ patent VG->RPDA, VG->OM3,  & LIMA->LAD.  EF 55-65%.  . Carotid bruit    2009 0-39% on dopplers bilatrally  . GERD (gastroesophageal reflux disease)   . History of kidney stones   . HOH (hard of hearing)   . HTN (hypertension)   . Hyperlipidemia   . Myocardial infarction 1996  . Nephrolithiasis   . OSA (obstructive sleep apnea)   . PAD (peripheral artery disease) ([place]) 02/2014   Subtotal occlusion of right common iliac artery and 70% stenosis in the left common iliac artery. Status post bilateral kissing stent placement. 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 Shortness of breath    with exertion  . Thrombocytopenia ([place])     Past Surgical History:  Procedure Laterality Date  . ABDOMINAL AORTAGRAM N/A 03/06/2014   Procedure: ABDOMINAL Maxcine Ham;  Surgeon: Wellington Hampshire, MD;  Location: [facility];  Service: Cardiovascular;  Laterality: N/A;  . CARDIAC CATHETERIZATION  02/2014   Severe three-vessel coronary artery disease with patent grafts.   Marland Kitchen CARDIAC CATHETERIZATION N/A 07/31/2015   Procedure: Right/Left Heart Cath and Coronary/Graft Angiography;  Surgeon: Wellington Hampshire, MD;  Location: [facility];  Service: Cardiovascular;  Laterality: N/A;  . CARDIAC  CATHETERIZATION N/A 07/31/2015   Procedure: Coronary Stent Intervention;  Surgeon: Wellington Hampshire, MD;  Location: [facility];  Service: Cardiovascular;  Laterality: N/A;  . CARDIAC CATHETERIZATION N/A 05/26/2016   Procedure: Right/Left Heart Cath and Coronary Angiography;  Surgeon: Wellington Hampshire, MD;  Location: [place];  Service: Cardiovascular;  Laterality: N/A;  . CORONARY ARTERY BYPASS GRAFT  1991   at [facility]. Redone 2004-3 vessels 1st time and 4 second time  . CYSTO     2/3 times for kidney  stones  . INGUINAL HERNIA REPAIR  11/10/2011   Procedure: HERNIA REPAIR INGUINAL ADULT;  Surgeon: Donato Heinz;  Location: [facility];  Service: General;  Laterality: Right;  . LEFT HEART CATHETERIZATION WITH CORONARY/GRAFT ANGIOGRAM N/A 03/06/2014   Procedure: LEFT HEART CATHETERIZATION WITH Beatrix Fetters;  Surgeon: Wellington Hampshire, MD;  Location: [place] [facility];  Service: Cardiovascular;  Laterality: N/A;  . STOMACH SURGERY     removal of gastric ulcers  . TEE WITHOUT CARDIOVERSION N/A 06/01/2016   Procedure: TRANSESOPHAGEAL ECHOCARDIOGRAM (TEE);  Surgeon: Sherren Mocha, MD;  Location: [facility];  Service: Open Heart Surgery;  Laterality: N/A;  . TRANSCATHETER AORTIC VALVE REPLACEMENT, TRANSFEMORAL N/A 06/01/2016   Procedure: TRANSCATHETER AORTIC VALVE REPLACEMENT, TRANSFEMORAL;  Surgeon: Sherren Mocha, MD;  Location: [facility];  Service: Open Heart Surgery;  Laterality: N/A;     Current Outpatient Prescriptions  Medication Sig Dispense Refill  . acetaminophen (TYLENOL) 325 MG tablet Take 650 mg by mouth daily as needed for mild pain or headache.     Marland Kitchen aspirin EC 81 MG tablet Take 1 tablet (81 mg total) by mouth daily. 90 tablet 3  . atorvastatin (LIPITOR) 80 MG tablet TAKE ONE TABLET BY MOUTH ONCE DAILY 90 tablet 3  . clopidogrel (PLAVIX) 75 MG tablet Take 1 tablet (75 mg total) by mouth daily with breakfast. 90 tablet 3  . hydrochlorothiazide (HYDRODIURIL) 25 MG tablet Take 1 tablet (25 mg total) by mouth daily. 90 tablet 3  . isosorbide dinitrate (ISORDIL) 10 MG tablet Take two tablets in the am with one tablet in the pm.    . lisinopril (PRINIVIL,ZESTRIL) 20 MG tablet TAKE ONE TABLET BY MOUTH ONCE DAILY 90 tablet 3  . metoprolol tartrate (LOPRESSOR) 25 MG tablet Take 0.5 tablets (12.5 mg total) by mouth at bedtime.    . Multiple Vitamins-Minerals (PRESERVISION AREDS PO) Take 1 tablet by mouth 2 (two) times daily.    . nitroGLYCERIN (NITROSTAT) 0.4 MG SL tablet Place 0.4 mg under the  tongue every 5 (five) minutes as needed for chest pain.    . pantoprazole (PROTONIX) 40 MG tablet Take 20 mg by mouth daily.    . tamsulosin (FLOMAX) 0.4 MG CAPS capsule Take 1 capsule (0.4 mg total) by mouth daily. 30 capsule 3   No current facility-administered medications for this visit.     Allergies:   Patient has no known allergies.    Social History:  The patient  reports that he quit smoking about 26 years ago. His smoking use included Cigarettes. He has a 50.00 pack-year smoking history. He has never used smokeless tobacco. He reports that he does not drink alcohol or use drugs.   Family History:  The patient's family history includes Heart disease in his father and mother.    ROS:  Please see the history of present illness.   Otherwise, review of systems are positive for none.   All other systems are reviewed and negative.  PHYSICAL EXAM: VS:  BP (!) 104/52 (BP Location: Left Arm, Patient Position: Sitting, Cuff Size: Normal)   Pulse 67   Ht 5\' 9"  (1.753 m)   Wt 187 lb (84.8 kg)   BMI 27.62 kg/m  , BMI Body mass index is 27.62 kg/m. GEN: Well nourished, well developed, in no acute distress  HEENT: normal  Neck: no JVD, carotid bruits, or masses Cardiac: RRR; no rubs, or gallops,no edema . 1/6 crescendo decrescendo aortic murmur which is early peaking. Respiratory:  clear to auscultation bilaterally, normal work of breathing GI: soft, nontender, nondistended, + BS MS: no deformity or atrophy  Skin: warm and dry, no rash Neuro:  Strength and sensation are intact Psych: euthymic mood, full affect   EKG:  EKG is ordered today. The ekg ordered today demonstrates sinus rhythm with left bundle branch block.   Recent Labs: 05/28/2016: ALT 13 06/03/2016: BUN 8; Creatinine, Ser 0.94; Hemoglobin 11.1; Platelets 78; Potassium 3.6; Sodium 137    Lipid Panel    Component Value Date/Time   CHOL 149 08/11/2015 1402   TRIG 224 (H) 08/11/2015 1402   HDL 41 08/11/2015 1402     CHOLHDL 3.6 08/11/2015 1402   CHOLHDL 3.3 Ratio 05/19/2009 2134   VLDL 18 05/19/2009 2134   LDLCALC 63 08/11/2015 1402      Wt Readings from Last 3 Encounters:  10/29/16 187 lb (84.8 kg)  07/09/16 184 lb 12.8 oz (83.8 kg)  06/03/16 186 lb (84.4 kg)        ASSESSMENT AND PLAN:  1.  S/p TAVR clinic for severe symptomatic aortic stenosis: He is doing reasonably well with only mild residual anginal symptoms. He continues to be active. Echocardiogram in July showed normal LV systolic function with normal functioning aortic valve prosthesis.  2. Coronary artery disease involving graft bypass with stable angina: Status post drug-eluting stent placement to SVG to OM 2 in August 2016. Continue dual antiplatelet therapy.  3. Essential hypertension: Blood pressure is somewhat on the low side. He is minimally symptomatic with occasional orthostatic dizziness. Continue to monitor and consider decreasing the dose of hydrochlorothiazide if needed.  4. Peripheral arterial disease: No recurrent claudication since bilateral iliac stenting.     Disposition:   FU with me in 6 months  Signed,  [name], MD  10/29/2016 11:38 AM    [place]

## 2016-11-09 ENCOUNTER — Other Ambulatory Visit: Payer: Self-pay | Admitting: *Deleted

## 2016-11-09 MED ORDER — HYDROCHLOROTHIAZIDE 25 MG PO TABS: 25.0000 mg | ORAL_TABLET | Freq: Every day | ORAL | 3 refills | Status: DC

## 2016-11-09 MED ORDER — CLOPIDOGREL BISULFATE 75 MG PO TABS: 75.0000 mg | ORAL_TABLET | Freq: Every day | ORAL | 3 refills | Status: DC

## 2016-11-09 MED ORDER — ATORVASTATIN CALCIUM 80 MG PO TABS
80.0000 mg | ORAL_TABLET | Freq: Every day | ORAL | 3 refills | Status: DC
Start: 2016-11-09 — End: 2017-10-09

## 2016-11-09 MED ORDER — METOPROLOL TARTRATE 25 MG PO TABS: 12.5000 mg | ORAL_TABLET | Freq: Every day | ORAL | 3 refills | Status: DC

## 2016-11-09 MED ORDER — LISINOPRIL 20 MG PO TABS: 20.0000 mg | ORAL_TABLET | Freq: Every day | ORAL | 3 refills | Status: DC

## 2016-11-09 MED ORDER — ISOSORBIDE DINITRATE 10 MG PO TABS: ORAL_TABLET | ORAL | 3 refills | Status: DC

## 2016-11-09 NOTE — Telephone Encounter (Signed)
Requested Prescriptions   Signed Prescriptions Disp Refills  . hydrochlorothiazide (HYDRODIURIL) 25 MG tablet 90 tablet 3    Sig: Take 1 tablet (25 mg total) by mouth daily.    Authorizing Provider: [name] A    Ordering User: Eugenio Hoes, Jaylia Pettus C  . metoprolol tartrate (LOPRESSOR) 25 MG tablet 45 tablet 3    Sig: Take 0.5 tablets (12.5 mg total) by mouth at bedtime.    Authorizing Provider: [name] A    Ordering User: Eugenio Hoes, Jadasia Haws C  . atorvastatin (LIPITOR) 80 MG tablet 90 tablet 3    Sig: Take 1 tablet (80 mg total) by mouth daily.    Authorizing Provider: [name] A    Ordering User: Eugenio Hoes, Brittnei Jagiello C  . clopidogrel (PLAVIX) 75 MG tablet 90 tablet 3    Sig: Take 1 tablet (75 mg total) by mouth daily with breakfast.    Authorizing Provider: [name] A    Ordering User: Tanishi Nault C  . isosorbide dinitrate (ISORDIL) 10 MG tablet 270 tablet 3    Sig: Take two tablets in the am with one tablet in the pm.    Authorizing Provider: [name] A    Ordering User: Eugenio Hoes, Margarie Mcguirt C  . lisinopril (PRINIVIL,ZESTRIL) 20 MG tablet 90 tablet 3    Sig: Take 1 tablet (20 mg total) by mouth daily.    Authorizing Provider: [name] A    Ordering User: Britt Bottom

## 2017-02-10 ENCOUNTER — Telehealth: Payer: Self-pay | Admitting: Cardiovascular Disease

## 2017-02-10 NOTE — Telephone Encounter (Signed)
Lmom to call our office to schedule a Abd aorta u/s, ABI & Renal ultrasound (1 yr f/u).

## 2017-02-18 ENCOUNTER — Telehealth: Payer: Self-pay | Admitting: Cardiovascular Disease

## 2017-02-18 DIAGNOSIS — Z952 Presence of prosthetic heart valve: Secondary | ICD-10-CM

## 2017-02-18 DIAGNOSIS — I350 Nonrheumatic aortic (valve) stenosis: Secondary | ICD-10-CM

## 2017-02-18 NOTE — Telephone Encounter (Signed)
New Message     Pt is returning Matthew [name] call about switching his appointments

## 2017-02-18 NOTE — Telephone Encounter (Signed)
I spoke with the pt and made him aware that he is due for TAVR Echo in June.  The pt would like to have this done at the [place] office if possible.  The pt has a pending appointment with Dr Fletcher Anon on 05/13/17. Order placed for Echo in [place] office and test scheduled.

## 2017-03-11 NOTE — Telephone Encounter (Signed)
lmov x 2 .  Mailed letter  Abd aorta u/s, ABI & Renal ultrasound (1 yr f/u).

## 2017-04-05 IMAGING — CR DG CHEST 1V PORT
1 series · 1 of 1 positions shown · non-contrast
Comparison: 05/28/2016

CLINICAL DATA: Severe aortic stenosis. Status post transcatheter
aortic valve replacement.

EXAM:
PORTABLE CHEST 1 VIEW

[AP]
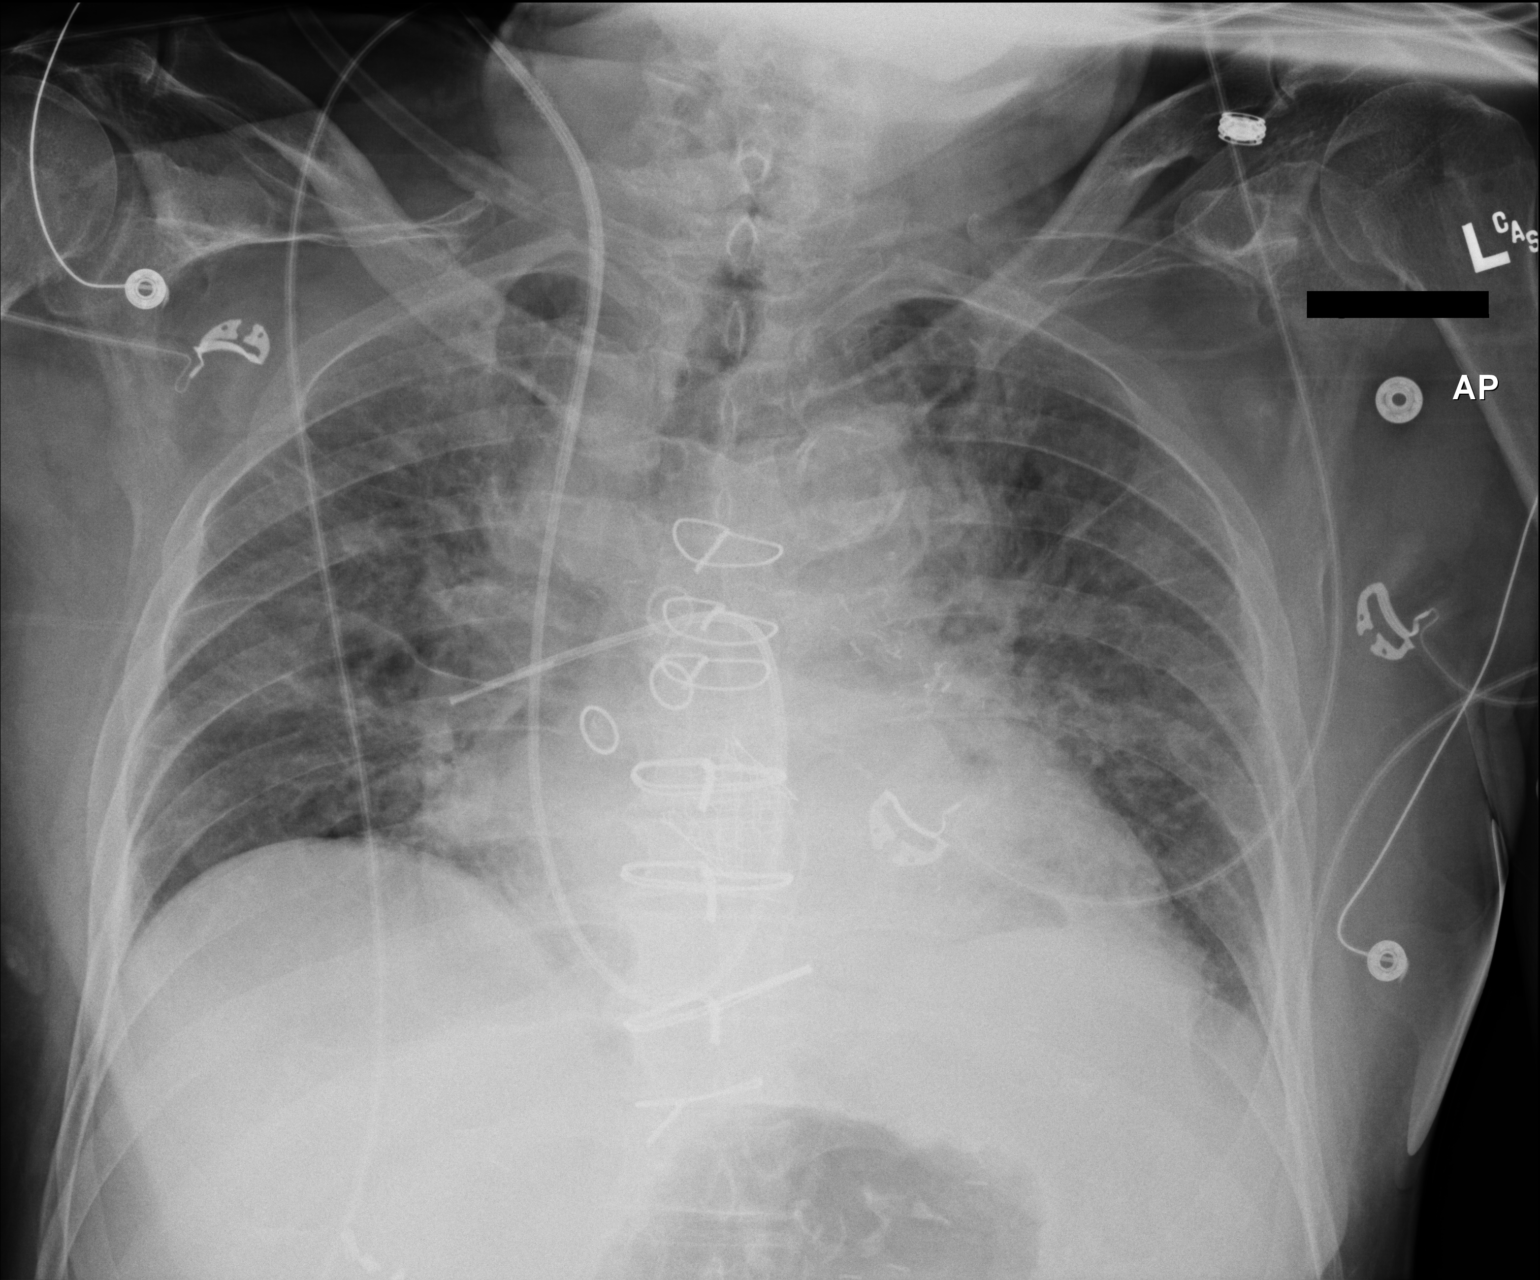

[1 of 1 positions shown; findings below may reference images not displayed]

FINDINGS: Right jugular Swan-Ganz catheter is seen with tip overlying the
right intralobar pulmonary artery. Prosthetic aortic valve noted as
well as Previous CABG.

Low lung volumes are seen with mild bibasilar atelectasis. There is
also diffuse interstitial infiltrates, consistent with interstitial
edema. No evidence of focal consolidation or pleural effusion. No
pneumothorax visualized.
IMPRESSION: Diffuse pulmonary interstitial edema and mild bibasilar atelectasis.
No pneumothorax visualized.

## 2017-04-06 IMAGING — CR DG CHEST 1V PORT
1 series · 1 of 1 positions shown · non-contrast
Comparison: 06/01/2016

CLINICAL DATA: Severe aortic stenosis

EXAM:
PORTABLE CHEST 1 VIEW

[AP]
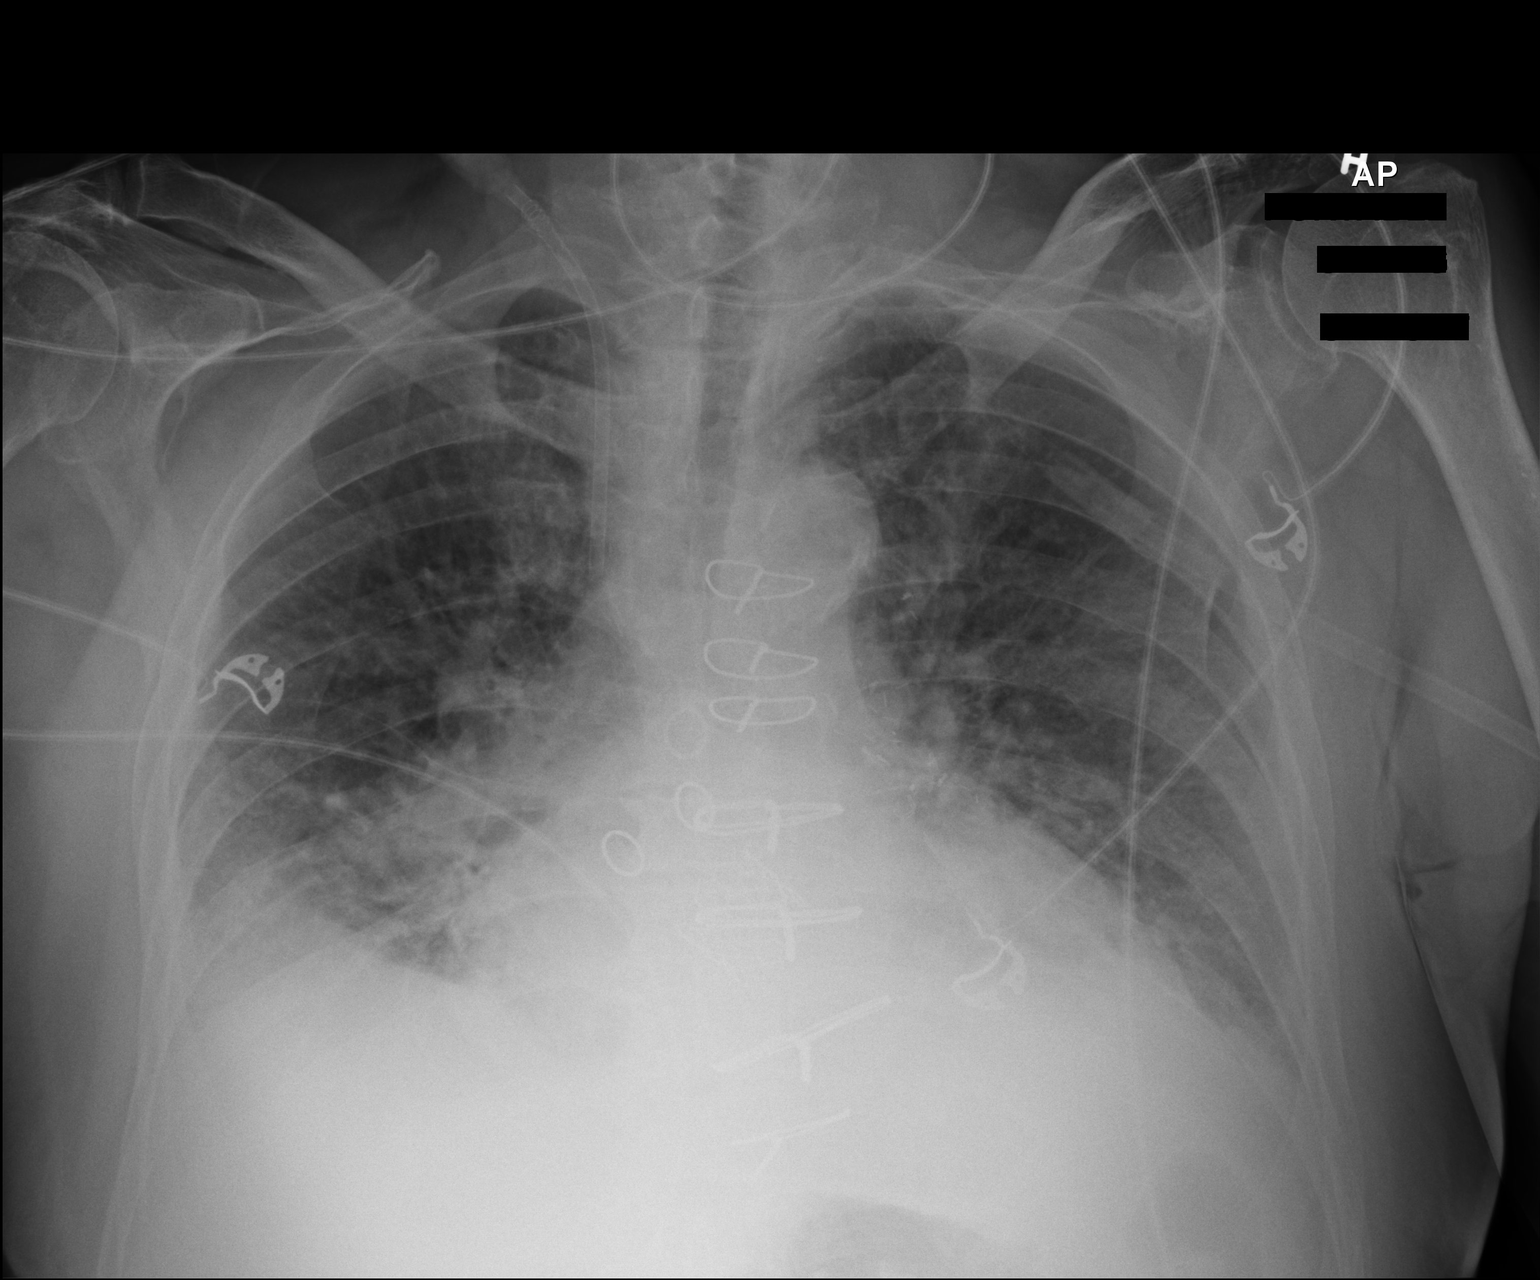

[1 of 1 positions shown; findings below may reference images not displayed]

FINDINGS: Cardiomediastinal silhouette is stable. Central mild vascular
congestion and mild residual perihilar interstitial edema with
improvement from prior exam. Right Swan-Ganz catheter has been
removed. There is right IJ sheath in place. Status post median
sternotomy and CABG. Small right pleural effusion. Bilateral basilar
atelectasis or infiltrate. There is no pneumothorax.
IMPRESSION: Right Swan-Ganz catheter has been removed. Status post median
sternotomy and CABG. Central mild vascular congestion and mild
perihilar residual interstitial edema with improvement from prior
exam. Small right pleural effusion. Bilateral basilar atelectasis or
infiltrate.

## 2017-05-05 ENCOUNTER — Encounter: Payer: Self-pay | Admitting: Cardiovascular Disease

## 2017-05-13 ENCOUNTER — Ambulatory Visit (INDEPENDENT_AMBULATORY_CARE_PROVIDER_SITE_OTHER): Payer: Medicare Other

## 2017-05-13 ENCOUNTER — Encounter: Payer: Self-pay | Admitting: Cardiovascular Disease

## 2017-05-13 ENCOUNTER — Ambulatory Visit (INDEPENDENT_AMBULATORY_CARE_PROVIDER_SITE_OTHER): Payer: Medicare Other | Admitting: Cardiovascular Disease

## 2017-05-13 ENCOUNTER — Other Ambulatory Visit: Payer: Self-pay

## 2017-05-13 VITALS — BP 130/60 | HR 61 | Ht 69.0 in | Wt 191.2 lb

## 2017-05-13 DIAGNOSIS — I25118 Atherosclerotic heart disease of native coronary artery with other forms of angina pectoris: Secondary | ICD-10-CM | POA: Diagnosis not present

## 2017-05-13 DIAGNOSIS — I350 Nonrheumatic aortic (valve) stenosis: Secondary | ICD-10-CM | POA: Diagnosis not present

## 2017-05-13 DIAGNOSIS — I10 Essential (primary) hypertension: Secondary | ICD-10-CM

## 2017-05-13 DIAGNOSIS — I739 Peripheral vascular disease, unspecified: Secondary | ICD-10-CM | POA: Diagnosis not present

## 2017-05-13 DIAGNOSIS — Z952 Presence of prosthetic heart valve: Secondary | ICD-10-CM | POA: Diagnosis not present

## 2017-05-13 LAB — ECHOCARDIOGRAM COMPLETE
Height: 69 in
Weight: 3060 oz

## 2017-05-13 NOTE — Patient Instructions (Signed)
Medication Instructions:  Your physician recommends that you continue on your current medications as directed. Please refer to the Current Medication list given to you today.   Labwork: none  Testing/Procedures: Echo appointment today  Follow-Up: Your physician wants you to follow-up in: 6 months with Dr. Fletcher Anon.  You will receive a reminder letter in the mail two months in advance. If you don't receive a letter, please call our office to schedule the follow-up appointment.   Any Other Special Instructions Will Be Listed Below (If Applicable).     If you need a refill on your cardiac medications before your next appointment, please call your pharmacy.

## 2017-05-13 NOTE — Progress Notes (Signed)
Cardiology Office Note   Date:  05/13/2017   ID:  THEOPOLIS SLOOP, DOB [birth year], MRN [id number]  PCP:  Antionette Fairy, PA-C  Cardiologist:   [name], MD   Chief Complaint  Patient presents with  . other    6 month follow up. Meds reviewed by the pt. verbally. Pt. c/o right hip pain and has chest pain in the evenings before he takes his evening medications.       History of Present Illness: Matthew [name] is a 81 y.o. male who presents for a follow-up visit regarding coronary artery disease , aortic stenosis and peripheral arterial disease. He is status post CABG twice in 1991 and 2004. He is status post bilateral common iliac artery kissing stent placement in 2015. He is status post TAVR in June 2017. Cardiac catheterization before that showed patent grafts. He has been doing well overall although he continues to have intermittent episodes of substernal chest tightness which usually happens if he is late in taking his medications. He is still able to do activities of daily living with no significant limitations. His biggest problem is right hip discomfort which happens after resting for a while. Walking makes it better. He has no calf claudication.   Past Medical History:  Diagnosis Date  . AS (aortic stenosis)    a. Echo 6/10: EF 55% mild AS; b. echo 06/2015; EF 55-60%, GR1DD, moderate AS, Peak velocity (S): 346 cm/s. Mean gradientS): 28 mm Hg. Peak gradient (S): 48 mm Hg. Valve area (VTI): 1.18 cm2;   c. Echo 4/17 - mild LVH, EF 55-60%, no RWMA, Gr 1 DD, mod to severe AS (mean 28 mmHg, peak 46 mmHg), mild LAE  . CAD (coronary artery disease)    a. MI 1996 w/ CABG 1996; b.redo CABG 06/2003; c. Myoview 7/09: EF 54% inferobasal infarct, no ischemia. Myoview 6/10 EF 43% inf wall infarct. no ischemia; c. cath 8/16 s/p DES to VG-OM2. OTW 3VD w/ patent VG->RPDA, VG->OM3,  & LIMA->LAD.  EF 55-65%.  . Carotid bruit    2009 0-39% on dopplers bilatrally  . GERD (gastroesophageal  reflux disease)   . History of kidney stones   . HOH (hard of hearing)   . HTN (hypertension)   . Hyperlipidemia   . Myocardial infarction ([place]) 1996  . Nephrolithiasis   . OSA (obstructive sleep apnea)   . PAD (peripheral artery disease) ([place]) 02/2014   Subtotal occlusion of right common iliac artery and 70% stenosis in the left common iliac artery. Status post bilateral kissing stent placement. 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 Shortness of breath    with exertion  . Thrombocytopenia ([place])     Past Surgical History:  Procedure Laterality Date  . ABDOMINAL AORTAGRAM N/A 03/06/2014   Procedure: ABDOMINAL Maxcine Ham;  Surgeon: Wellington Hampshire, MD;  Location: [place] CATH [facility];  Service: Cardiovascular;  Laterality: N/A;  . CARDIAC CATHETERIZATION  02/2014   Severe three-vessel coronary artery disease with patent grafts.   Marland Kitchen CARDIAC CATHETERIZATION N/A 07/31/2015   Procedure: Right/Left Heart Cath and Coronary/Graft Angiography;  Surgeon: Wellington Hampshire, MD;  Location: [place] [facility];  Service: Cardiovascular;  Laterality: N/A;  . CARDIAC CATHETERIZATION N/A 07/31/2015   Procedure: Coronary Stent Intervention;  Surgeon: Wellington Hampshire, MD;  Location: [place] CV [facility];  Service:  Cardiovascular;  Laterality: N/A;  . CARDIAC CATHETERIZATION N/A 05/26/2016   Procedure: Right/Left Heart Cath and Coronary Angiography;  Surgeon: Wellington Hampshire, MD;  Location: [place] CV [facility];  Service: Cardiovascular;  Laterality: N/A;  . CORONARY ARTERY BYPASS GRAFT  1991   at [facility]. Redone 2004-3 vessels 1st time and 4 second time  . CYSTO     2/3 times for kidney stones  . INGUINAL HERNIA REPAIR  11/10/2011   Procedure: HERNIA REPAIR INGUINAL ADULT;  Surgeon: Donato Heinz;  [facility];  Service: General;  Laterality:  Right;  . LEFT HEART CATHETERIZATION WITH CORONARY/GRAFT ANGIOGRAM N/A 03/06/2014   Procedure: LEFT HEART CATHETERIZATION WITH Beatrix Fetters;  Surgeon: Wellington Hampshire, MD;  Location: [place] CATH [facility];  Service: Cardiovascular;  Laterality: N/A;  . STOMACH SURGERY     removal of gastric ulcers  . TEE WITHOUT CARDIOVERSION N/A 06/01/2016   Procedure: TRANSESOPHAGEAL ECHOCARDIOGRAM (TEE);  Surgeon: Sherren Mocha, MD;  Location: [place];  Service: Open Heart Surgery;  Laterality: N/A;  . TRANSCATHETER AORTIC VALVE REPLACEMENT, TRANSFEMORAL N/A 06/01/2016   Procedure: TRANSCATHETER AORTIC VALVE REPLACEMENT, TRANSFEMORAL;  Surgeon: Sherren Mocha, MD;  Location: [place];  Service: Open Heart Surgery;  Laterality: N/A;     Current Outpatient Prescriptions  Medication Sig Dispense Refill  . acetaminophen (TYLENOL) 325 MG tablet Take 650 mg by mouth daily as needed for mild pain or headache.     Marland Kitchen aspirin EC 81 MG tablet Take 1 tablet (81 mg total) by mouth daily. 90 tablet 3  . atorvastatin (LIPITOR) 80 MG tablet Take 1 tablet (80 mg total) by mouth daily. 90 tablet 3  . clopidogrel (PLAVIX) 75 MG tablet Take 1 tablet (75 mg total) by mouth daily with breakfast. 90 tablet 3  . hydrochlorothiazide (HYDRODIURIL) 25 MG tablet Take 1 tablet (25 mg total) by mouth daily. 90 tablet 3  . isosorbide dinitrate (ISORDIL) 10 MG tablet Take two tablets in the am with one tablet in the pm. 270 tablet 3  . lisinopril (PRINIVIL,ZESTRIL) 20 MG tablet Take 1 tablet (20 mg total) by mouth daily. 90 tablet 3  . metoprolol tartrate (LOPRESSOR) 25 MG tablet Take 0.5 tablets (12.5 mg total) by mouth at bedtime. 45 tablet 3  . Multiple Vitamins-Minerals (PRESERVISION AREDS PO) Take 1 tablet by mouth 2 (two) times daily.    . nitroGLYCERIN (NITROSTAT) 0.4 MG SL tablet Place 0.4 mg under the tongue every 5 (five) minutes as needed for chest pain.    . pantoprazole (PROTONIX) 40 MG tablet Take 20 mg by mouth daily.    .  tamsulosin (FLOMAX) 0.4 MG CAPS capsule Take 1 capsule (0.4 mg total) by mouth daily. 30 capsule 3   No current facility-administered medications for this visit.     Allergies:   Patient has no known allergies.    Social History:  The patient  reports that he quit smoking about 27 years ago. His smoking use included Cigarettes. He has a 50.00 pack-year smoking history. He has never used smokeless tobacco. He reports that he does not drink alcohol or use drugs.   Family History:  The patient's family history includes Heart disease in his father and mother.    ROS:  Please see the history of present illness.   Otherwise, review of systems are positive for none.   All other systems are reviewed and negative.    PHYSICAL EXAM: VS:  BP 130/60 (BP Location: Left Arm, Patient Position: Sitting, Cuff  Size: Normal)   Pulse 61   Ht 5\' 9"  (1.753 m)   Wt 191 lb 4 oz (86.8 kg)   BMI 28.24 kg/m  , BMI Body mass index is 28.24 kg/m. GEN: Well nourished, well developed, in no acute distress  HEENT: normal  Neck: no JVD, carotid bruits, or masses Cardiac: RRR; no rubs, or gallops,no edema . 2/6 crescendo decrescendo aortic murmur which is early peaking. Respiratory:  clear to auscultation bilaterally, normal work of breathing GI: soft, nontender, nondistended, + BS MS: no deformity or atrophy  Skin: warm and dry, no rash Neuro:  Strength and sensation are intact Psych: euthymic mood, full affect Vascular: Femoral pulses are normal bilaterally. Distal pulses are palpable.  EKG:  EKG is ordered today. The ekg ordered today demonstrates sinus rhythm with left bundle branch block.   Recent Labs: 05/28/2016: ALT 13 06/03/2016: BUN 8; Creatinine, Ser 0.94; Hemoglobin 11.1; Platelets 78; Potassium 3.6; Sodium 137    Lipid Panel    Component Value Date/Time   CHOL 149 08/11/2015 1402   TRIG 224 (H) 08/11/2015 1402   HDL 41 08/11/2015 1402   CHOLHDL 3.6 08/11/2015 1402   CHOLHDL 3.3 Ratio  05/19/2009 2134   VLDL 18 05/19/2009 2134   LDLCALC 63 08/11/2015 1402      Wt Readings from Last 3 Encounters:  05/13/17 191 lb 4 oz (86.8 kg)  10/29/16 187 lb (84.8 kg)  07/09/16 184 lb 12.8 oz (83.8 kg)        ASSESSMENT AND PLAN:  1.  S/p TAVR  for severe symptomatic aortic stenosis:  He is due for repeat echocardiogram today.  2. Coronary artery disease involving graft bypass with stable angina: Status post drug-eluting stent placement to SVG to OM 2 in August 2016. Continue dual antiplatelet therapy. He has stable class II angina. Continue medical therapy.  3. Essential hypertension: Blood pressure is controlled on current medications   4. Peripheral arterial disease: No recurrent claudication since bilateral iliac stenting. His right hip pain is not consistent with claudication. His pulses are also normal. I advised him to follow-up with his primary care physician for right hip x-ray. I suspect that he has significant arthritis.  5. Hyperlipidemia: Continue high-dose atorvastatin with a target LDL of less than 70. This is managed by his primary care physician.    Disposition:   FU with me in 6 months  Signed,  [name], MD  05/13/2017 10:08 AM    [place]

## 2017-05-23 ENCOUNTER — Encounter: Payer: Self-pay | Admitting: Cardiovascular Disease

## 2017-05-23 ENCOUNTER — Other Ambulatory Visit: Payer: Self-pay

## 2017-05-23 ENCOUNTER — Ambulatory Visit (INDEPENDENT_AMBULATORY_CARE_PROVIDER_SITE_OTHER): Payer: Medicare Other | Admitting: Cardiovascular Disease

## 2017-05-23 ENCOUNTER — Encounter (INDEPENDENT_AMBULATORY_CARE_PROVIDER_SITE_OTHER): Payer: Self-pay

## 2017-05-23 VITALS — BP 130/40 | HR 62 | Ht 68.0 in | Wt 191.4 lb

## 2017-05-23 DIAGNOSIS — Z952 Presence of prosthetic heart valve: Secondary | ICD-10-CM

## 2017-05-23 DIAGNOSIS — I209 Angina pectoris, unspecified: Secondary | ICD-10-CM

## 2017-05-23 NOTE — Progress Notes (Signed)
Cardiology Office Note Date:  05/25/2017   ID:  Matthew [name], DOB Mar 13, 1936, MRN [id number]  PCP:  Antionette Fairy PA-C  Cardiologist:  Sherren Mocha, MD    Chief Complaint  Patient presents with  . Follow-up    1 year     History of Present Illness: Matthew [name] is a 81 y.o. male who presents for follow-up of aortic valve disease. He underwent TAVR via a percutaneous right transfemoral approach 06/01/2016 with a 26 mm Sapien 3 transcatheter heart valve. He had previously undergone initial CABG in 1991 and redo CABG in 2004. He had presented with progressive shortness of breath attributed to severe aortic stenosis.  The patient is here alone today. He reports stable symptoms of exertional chest discomfort and shortness of breath. When he walks at a normal pace he has no symptoms. When he does more physical work like gardening or working in his shop, he has some limitation from chest discomfort. He stops and rests with resolution of symptoms in 3-4 minutes. He has not required any nitroglycerin. He denies orthopnea, PND, lightheadedness, or heart palpitations.  Past Medical History:  Diagnosis Date  . AS (aortic stenosis)    a. Echo 6/10: EF 55% mild AS; b. echo 06/2015; EF 55-60%, GR1DD, moderate AS, Peak velocity (S): 346 cm/s. Mean gradientS): 28 mm Hg. Peak gradient (S): 48 mm Hg. Valve area (VTI): 1.18 cm2;   c. Echo 4/17 - mild LVH, EF 55-60%, no RWMA, Gr 1 DD, mod to severe AS (mean 28 mmHg, peak 46 mmHg), mild LAE  . CAD (coronary artery disease)    a. MI 1996 w/ CABG 1996; b.redo CABG 06/2003; c. Myoview 7/09: EF 54% inferobasal infarct, no ischemia. Myoview 6/10 EF 43% inf wall infarct. no ischemia; c. cath 8/16 s/p DES to VG-OM2. OTW 3VD w/ patent VG->RPDA, VG->OM3,  & LIMA->LAD.  EF 55-65%.  . Carotid bruit    2009 0-39% on dopplers bilatrally  . GERD (gastroesophageal reflux disease)   . History of kidney stones   . HOH (hard of hearing)   . HTN (hypertension)   .  Hyperlipidemia   . Myocardial infarction ([place]) 1996  . Nephrolithiasis   . OSA (obstructive sleep apnea)   . PAD (peripheral artery disease) ([place]) 02/2014   Subtotal occlusion of right common iliac artery and 70% stenosis in the left common iliac artery. Status post bilateral kissing stent placement. 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 Shortness of breath    with exertion  . Thrombocytopenia ([place])     Past Surgical History:  Procedure Laterality Date  . ABDOMINAL AORTAGRAM N/A 03/06/2014   Procedure: ABDOMINAL Maxcine Ham;  Surgeon: Wellington Hampshire, MD;  Location: [place] CATH LAB;  Service: Cardiovascular;  Laterality: N/A;  . CARDIAC CATHETERIZATION  02/2014   Severe three-vessel coronary artery disease with patent grafts.   Marland Kitchen CARDIAC CATHETERIZATION N/A 07/31/2015   Procedure: Right/Left Heart Cath and Coronary/Graft Angiography;  Surgeon: Wellington Hampshire, MD;  Location: [place] [facility];  Service: Cardiovascular;  Laterality: N/A;  . CARDIAC CATHETERIZATION N/A 07/31/2015   Procedure: Coronary Stent Intervention;  Surgeon: Wellington Hampshire, MD;  Location: [place] CV LAB;  Service: Cardiovascular;  Laterality: N/A;  . CARDIAC CATHETERIZATION N/A 05/26/2016   Procedure: Right/Left Heart Cath and Coronary Angiography;  Surgeon: Wellington Hampshire,  MD;  Location: [place] CV LAB;  Service: Cardiovascular;  Laterality: N/A;  . CORONARY ARTERY BYPASS GRAFT  1991   at [facility]. Redone 2004-3 vessels 1st time and 4 second time  . CYSTO     2/3 times for kidney stones  . INGUINAL HERNIA REPAIR  11/10/2011   Procedure: HERNIA REPAIR INGUINAL ADULT;  Surgeon: Donato Heinz;  [facility];  Service: General;  Laterality: Right;  . LEFT HEART CATHETERIZATION WITH CORONARY/GRAFT ANGIOGRAM N/A 03/06/2014   Procedure: LEFT  HEART CATHETERIZATION WITH Beatrix Fetters;  Surgeon: Wellington Hampshire, MD;  Location: [place] CATH LAB;  Service: Cardiovascular;  Laterality: N/A;  . STOMACH SURGERY     removal of gastric ulcers  . TEE WITHOUT CARDIOVERSION N/A 06/01/2016   Procedure: TRANSESOPHAGEAL ECHOCARDIOGRAM (TEE);  Surgeon: Sherren Mocha, MD;  Location: [place];  Service: Open Heart Surgery;  Laterality: N/A;  . TRANSCATHETER AORTIC VALVE REPLACEMENT, TRANSFEMORAL N/A 06/01/2016   Procedure: TRANSCATHETER AORTIC VALVE REPLACEMENT, TRANSFEMORAL;  Surgeon: Sherren Mocha, MD;  Location: [place];  Service: Open Heart Surgery;  Laterality: N/A;    Current Outpatient Prescriptions  Medication Sig Dispense Refill  . acetaminophen (TYLENOL) 325 MG tablet Take 650 mg by mouth daily as needed for mild pain or headache.     Marland Kitchen aspirin EC 81 MG tablet Take 1 tablet (81 mg total) by mouth daily. 90 tablet 3  . atorvastatin (LIPITOR) 80 MG tablet Take 1 tablet (80 mg total) by mouth daily. 90 tablet 3  . clopidogrel (PLAVIX) 75 MG tablet Take 1 tablet (75 mg total) by mouth daily with breakfast. 90 tablet 3  . hydrochlorothiazide (HYDRODIURIL) 25 MG tablet Take 25 mg by mouth daily.    . isosorbide dinitrate (ISORDIL) 10 MG tablet Take 20 mg by mouth every morning. Take 10 mg by mouth in the afternoon    . lisinopril (PRINIVIL,ZESTRIL) 20 MG tablet Take 1 tablet (20 mg total) by mouth daily. 90 tablet 3  . metoprolol tartrate (LOPRESSOR) 25 MG tablet Take 0.5 tablets (12.5 mg total) by mouth at bedtime. 45 tablet 3  . Multiple Vitamins-Minerals (PRESERVISION AREDS PO) Take 1 tablet by mouth 2 (two) times daily.    . nitroGLYCERIN (NITROSTAT) 0.4 MG SL tablet Place 0.4 mg under the tongue every 5 (five) minutes as needed for chest pain.    . pantoprazole (PROTONIX) 40 MG tablet Take 20 mg by mouth daily.    . tamsulosin (FLOMAX) 0.4 MG CAPS capsule Take 1 capsule (0.4 mg total) by mouth daily. 30 capsule 3   No current  facility-administered medications for this visit.     Allergies:   Patient has no known allergies.   Social History:  The patient  reports that he quit smoking about 27 years ago. His smoking use included Cigarettes. He has a 50.00 pack-year smoking history. He has never used smokeless tobacco. He reports that he does not drink alcohol or use drugs.   Family History:  The patient's  family history includes Heart disease in his father and mother.    ROS:  Please see the history of present illness.  All other systems are reviewed and negative.    PHYSICAL EXAM: VS:  BP (!) 130/40   Pulse 62   Ht 5\' 8"  (1.727 m)   Wt 191 lb 6.4 oz (86.8 kg)   BMI 29.10 kg/m  , BMI Body mass index is 29.1 kg/m. GEN: Well nourished, well developed, in no acute distress  HEENT:  normal  Neck: no JVD, no masses.  Cardiac: RRR with 2/6 systolic murmur at the RUSB Respiratory:  clear to auscultation bilaterally, normal work of breathing GI: soft, nontender, nondistended, + BS MS: no deformity or atrophy  Ext: no pretibial edema Skin: warm and dry, no rash Neuro:  Strength and sensation are intact Psych: euthymic mood, full affect  EKG:  EKG is ordered today. The ekg ordered today shows normal sinus rhythm with left bundle branch block and ventricular bigeminy  Recent Labs: 05/28/2016: ALT 13 06/03/2016: BUN 8; Creatinine, Ser 0.94; Hemoglobin 11.1; Platelets 78; Potassium 3.6; Sodium 137   Lipid Panel     Component Value Date/Time   CHOL 149 08/11/2015 1402   TRIG 224 (H) 08/11/2015 1402   HDL 41 08/11/2015 1402   CHOLHDL 3.6 08/11/2015 1402   CHOLHDL 3.3 Ratio 05/19/2009 2134   VLDL 18 05/19/2009 2134   LDLCALC 63 08/11/2015 1402      Wt Readings from Last 3 Encounters:  05/23/17 191 lb 6.4 oz (86.8 kg)  05/13/17 191 lb 4 oz (86.8 kg)  10/29/16 187 lb (84.8 kg)     Cardiac Studies Reviewed: 2D Echo 05/13/2017: Study Conclusions  - Left ventricle: The cavity size was mildly dilated.  There was   mild concentric hypertrophy. Systolic function was mildly   reduced. The estimated ejection fraction was in the range of 45%   to 50%. Doppler parameters are consistent with abnormal left   ventricular relaxation (grade 1 diastolic dysfunction). - Ventricular septum: Septal motion showed abnormal function and   dyssynergy. - Aortic valve: A TAVR prosthesis was present and functioning   normally. Mean gradient (S): 11 mm Hg. Peak gradient (S): 20 mm   Hg. Valve area (VTI): 1.79 cm^2. - Left atrium: The atrium was moderately to severely dilated. - Right atrium: The atrium was mildly dilated.  Impressions:  - Comapred to most recent echo, the LV is mildly more dilated with   mildly reduced EF.  ASSESSMENT AND PLAN: Aortic valve disease now one year status post TAVR: Recent echocardiogram is reviewed and demonstrates normal function of his aortic valve bioprosthesis. Patient continues on long-term dual antiplatelet therapy in the context of his extensive coronary disease. He continues to have New York Heart Association functional class II symptoms of chronic diastolic heart failure and exertional angina. No changes are made in his medical regimen today. He will follow-up with Dr. Fletcher Anon. I would be happy to see him back in the future if any problems arise. SBE prophylaxis guidelines are reviewed with him today.  Current medicines are reviewed with the patient today.  The patient does not have concerns regarding medicines.  Labs/ tests ordered today include:   Orders Placed This Encounter  Procedures  . EKG 12-Lead    Disposition:   FU Dr Fletcher Anon as planned. I would be happy to see back if problems arise  Signed, Sherren Mocha, MD  05/25/2017 1:49 PM    [facility] [address], [place], [place]  [address] Phone: [phone]; Fax: [phone]

## 2017-05-23 NOTE — Patient Instructions (Signed)
Medication Instructions:  Your physician recommends that you continue on your current medications as directed. Please refer to the Current Medication list given to you today.  Labwork: No new orders.   Testing/Procedures: No new orders.   Follow-Up: Your physician recommends that you schedule a follow-up appointment as needed with Dr Burt Knack.  Continue routine Cardiology follow-up with Dr Fletcher Anon.    Any Other Special Instructions Will Be Listed Below (If Applicable).     If you need a refill on your cardiac medications before your next appointment, please call your pharmacy.

## 2017-06-02 ENCOUNTER — Ambulatory Visit
Admission: RE | Admit: 2017-06-02 | Discharge: 2017-06-02 | Disposition: A | Discharge status: Home / Self Care | Payer: Medicare Other | Source: Ambulatory Visit | Attending: Nurse Practitioner | Admitting: Nurse Practitioner

## 2017-06-02 ENCOUNTER — Other Ambulatory Visit: Payer: Self-pay | Admitting: Nurse Practitioner

## 2017-06-02 DIAGNOSIS — M160 Bilateral primary osteoarthritis of hip: Secondary | ICD-10-CM | POA: Diagnosis not present

## 2017-06-02 DIAGNOSIS — M25551 Pain in right hip: Secondary | ICD-10-CM

## 2017-08-31 ENCOUNTER — Ambulatory Visit
Admission: RE | Admit: 2017-08-31 | Discharge: 2017-08-31 | Disposition: A | Discharge status: Home / Self Care | Payer: Medicare Other | Source: Ambulatory Visit | Attending: Nurse Practitioner | Admitting: Nurse Practitioner

## 2017-08-31 ENCOUNTER — Encounter: Payer: Self-pay | Admitting: Nurse Practitioner

## 2017-08-31 ENCOUNTER — Ambulatory Visit: Payer: Medicare Other | Attending: Nurse Practitioner | Admitting: Nurse Practitioner

## 2017-08-31 VITALS — BP 147/53 | HR 75 | Temp 97.2°F | Ht 69.0 in | Wt 190.0 lb

## 2017-08-31 DIAGNOSIS — M5442 Lumbago with sciatica, left side: Secondary | ICD-10-CM | POA: Insufficient documentation

## 2017-08-31 DIAGNOSIS — I70208 Unspecified atherosclerosis of native arteries of extremities, other extremity: Secondary | ICD-10-CM | POA: Insufficient documentation

## 2017-08-31 DIAGNOSIS — D696 Thrombocytopenia, unspecified: Secondary | ICD-10-CM | POA: Insufficient documentation

## 2017-08-31 DIAGNOSIS — I2510 Atherosclerotic heart disease of native coronary artery without angina pectoris: Secondary | ICD-10-CM | POA: Diagnosis not present

## 2017-08-31 DIAGNOSIS — K219 Gastro-esophageal reflux disease without esophagitis: Secondary | ICD-10-CM | POA: Insufficient documentation

## 2017-08-31 DIAGNOSIS — G4733 Obstructive sleep apnea (adult) (pediatric): Secondary | ICD-10-CM | POA: Insufficient documentation

## 2017-08-31 DIAGNOSIS — Z7982 Long term (current) use of aspirin: Secondary | ICD-10-CM | POA: Diagnosis not present

## 2017-08-31 DIAGNOSIS — Z87442 Personal history of urinary calculi: Secondary | ICD-10-CM | POA: Diagnosis not present

## 2017-08-31 DIAGNOSIS — Z79891 Long term (current) use of opiate analgesic: Secondary | ICD-10-CM | POA: Insufficient documentation

## 2017-08-31 DIAGNOSIS — M545 Low back pain: Secondary | ICD-10-CM | POA: Diagnosis present

## 2017-08-31 DIAGNOSIS — M79604 Pain in right leg: Secondary | ICD-10-CM | POA: Diagnosis not present

## 2017-08-31 DIAGNOSIS — M533 Sacrococcygeal disorders, not elsewhere classified: Secondary | ICD-10-CM | POA: Insufficient documentation

## 2017-08-31 DIAGNOSIS — I358 Other nonrheumatic aortic valve disorders: Secondary | ICD-10-CM | POA: Diagnosis not present

## 2017-08-31 DIAGNOSIS — I739 Peripheral vascular disease, unspecified: Secondary | ICD-10-CM | POA: Insufficient documentation

## 2017-08-31 DIAGNOSIS — G8929 Other chronic pain: Secondary | ICD-10-CM | POA: Diagnosis not present

## 2017-08-31 DIAGNOSIS — I10 Essential (primary) hypertension: Secondary | ICD-10-CM | POA: Diagnosis not present

## 2017-08-31 DIAGNOSIS — M25559 Pain in unspecified hip: Secondary | ICD-10-CM

## 2017-08-31 DIAGNOSIS — M25551 Pain in right hip: Secondary | ICD-10-CM | POA: Diagnosis not present

## 2017-08-31 DIAGNOSIS — Z87891 Personal history of nicotine dependence: Secondary | ICD-10-CM | POA: Diagnosis not present

## 2017-08-31 DIAGNOSIS — E785 Hyperlipidemia, unspecified: Secondary | ICD-10-CM | POA: Diagnosis not present

## 2017-08-31 DIAGNOSIS — F1190 Opioid use, unspecified, uncomplicated: Secondary | ICD-10-CM | POA: Insufficient documentation

## 2017-08-31 DIAGNOSIS — G894 Chronic pain syndrome: Secondary | ICD-10-CM | POA: Insufficient documentation

## 2017-08-31 DIAGNOSIS — I700 Atherosclerosis of aorta: Secondary | ICD-10-CM | POA: Diagnosis not present

## 2017-08-31 DIAGNOSIS — I252 Old myocardial infarction: Secondary | ICD-10-CM | POA: Diagnosis not present

## 2017-08-31 DIAGNOSIS — M5441 Lumbago with sciatica, right side: Secondary | ICD-10-CM

## 2017-08-31 DIAGNOSIS — I219 Acute myocardial infarction, unspecified: Secondary | ICD-10-CM | POA: Insufficient documentation

## 2017-08-31 DIAGNOSIS — M47816 Spondylosis without myelopathy or radiculopathy, lumbar region: Secondary | ICD-10-CM | POA: Insufficient documentation

## 2017-08-31 DIAGNOSIS — M79605 Pain in left leg: Secondary | ICD-10-CM | POA: Diagnosis not present

## 2017-08-31 NOTE — Progress Notes (Signed)
Safety precautions to be maintained throughout the outpatient stay will include: orient to surroundings, keep bed in low position, maintain call bell within reach at all times, provide assistance with transfer out of bed and ambulation.  

## 2017-08-31 NOTE — Progress Notes (Signed)
Patient's Name: Matthew [name]  MRN: [id number]  Referring Provider: Barry Dienes, NP  DOB: 1936/03/22  PCP: Vesta Mixer  DOS: 08/31/2017  Note by: Dionisio David NP  Service setting: Ambulatory outpatient  Specialty: Interventional Pain Management  Location: [facility] (AMB) Pain Management Facility    Patient type: New Patient    Primary Reason(s) for Visit: Initial Patient Evaluation CC: Back Pain (lower)  HPI  Matthew [name] is a 81 y.o. year old, male patient, who comes today for an initial evaluation. He has HLD (hyperlipidemia); Obstructive sleep apnea; CENTRAL SLEEP APNEA CONDS CLASSIFIED ELSEWHERE; Essential hypertension; Coronary atherosclerosis; Aortic valve disorder; GERD (gastroesophageal reflux disease); Angina pectoris ([place]); PAD (peripheral artery disease) ([place]); Effort angina ([place]); Coronary artery disease; Thrombocytopenia ([place]); Severe aortic stenosis; Long term current use of opiate analgesic; Long term prescription opiate use; Opiate use; Chronic low back pain; Chronic pain of lower extremity; Chronic pain syndrome; and Chronic sacroiliac joint pain on his problem list.. His primarily concern today is the Back Pain (lower)  Pain Assessment: Location: Lower Back Radiating:   Onset: More than a month ago Duration: Chronic pain Quality: Aching, Constant, Discomfort, Nagging (foot numb/tingles left) Severity: 6 /10 (self-reported pain score)  Note: Reported level is compatible with observation.                   Effect on ADL: cant walk long distances Timing: Constant Modifying factors: Tylenol  Onset and Duration: Gradual and Date of onset: 05/18 Cause of pain: Unknown Severity: Getting worse, NAS-11 at its worse: 6/10, NAS-11 at its best: 6/10, NAS-11 now: 6/10 and NAS-11 on the average: 6/10 Timing: Morning, Noon, Afternoon, Not influenced by the time of the day, During activity or exercise and After activity or exercise Aggravating Factors: Bending, Motion, Prolonged  standing, Walking uphill and Walking downhill Alleviating Factors: Lying down and Sitting Associated Problems: Pain that wakes patient up Quality of Pain: Aching Previous Examinations or Tests: X-rays Previous Treatments: Narcotic medications  The patient comes into the clinics today for the first time for a chronic pain management evaluation. According to the patient and daughter his primary area of pain is in his lower back. He denies any precipitating factors. He admits that the left side is greater than the right. He denies any previous surgery, interventional therapies or physical therapy. He did have recent images; xray.   His second area of pain is in his lower extremities. The left side is worse than the right. The pain goes down into his lower calf bilaterally. He admits that he does have numbness and tingling in his left foot only. He does have some weakness in his legs but denies any recent falls. He denies any previous surgery, interventional therapies or physical therapy or recent images.   Today I took the time to provide the patient with information regarding this pain practice. The patient was informed that the practice is divided into two sections: an interventional pain management section, as well as a completely separate and distinct medication management section. I explained that there are procedure days for interventional therapies, and evaluation days for follow-ups and medication management. Because of the amount of documentation required during both, they are kept separated. This means that there is the possibility that he may be scheduled for a procedure on one day, and medication management the next. I have also informed his that because of staffing and facility limitations, this practice will no longer take patients for medication management only. To  illustrate the reasons for this, I gave the patient the example of surgeons, and how inappropriate it would be to refer a patient  to his care, just to write for the post-surgical antibiotics on a surgery done by a different surgeon.   Because interventional pain management is part of the board-certified specialty for the doctors, the patient was informed that joining this practice means that they are open to any and all interventional therapies. I made it clear that this does not mean that they will be forced to have any procedures done. What this means is that I believe interventional therapies to be essential part of the diagnosis and proper management of chronic pain conditions. Therefore, patients not interested in these interventional alternatives will be better served under the care of a different practitioner.  The patient was also made aware of my Comprehensive Pain Management Safety Guidelines where by joining this practice, they limit all of their nerve blocks and joint injections to those done by our practice, for as long as we are retained to manage their care. Historic Controlled Substance Pharmacotherapy Review  PMP and historical list of controlled substances: Acetaminophen #3, Hydrocodone/Acetaminophen 5/376m  Highest opioid analgesic regimen found:Hydrocodone/Acetaminophen 5/3251m2 tabs every 2-3 hours (fill date 09/19/2012) Hydrocodone 6073mer day Most recent opioid analgesic: Acetaminophen #3 1 tab 5 times daily (fill date 05/27/2016) Current opioid analgesics: Non Highest recorded MME/day: 9m65my MME/day:0 mg/day Medications: The patient did not bring the medication(s) to the appointment, as requested in our "New Patient Package" Pharmacodynamics: Desired effects: Analgesia: The patient reports >50% benefit. Reported improvement in function: The patient reports medication allows him to accomplish basic ADLs. Clinically meaningful improvement in function (CMIF): Sustained CMIF goals met Perceived effectiveness: Described as relatively effective, allowing for increase in activities of daily living  (ADL) Undesirable effects: Side-effects or Adverse reactions: None reported Historical Monitoring: The patient  reports that he does not use drugs. List of all UDS Test(s): No results found for: MDMA, COCAINSCRNUR, PCPSCRNUR, PCPQUANT, CANNABQUANT, THCU, ETH [place] of all Serum Drug Screening Test(s):  No results found for: AMPHSCRSER, BARBSCRSER, BENZOSCRSER, COCAINSCRSER, PCPSCRSER, PCPQUANT, THCSCRSER, CANNABQUANT, OPIATESCRSER, OXYSCRSER, PROPOXSCRSER Historical Background Evaluation: [place] PDMP: Six (6) year initial data search conducted.             [place] Department of public safety, offender search: (PubEditor, commissioningormation) Non-contributory Risk Assessment Profile: Aberrant behavior: None observed or detected today Risk factors for fatal opioid overdose: caucasian and sleep apnea Fatal overdose hazard ratio (HR): Calculation deferred Non-fatal overdose hazard ratio (HR): Calculation deferred Risk of opioid abuse or dependence: 0.7-3.0% with doses ? 36 MME/day and 6.1-26% with doses ? 120 MME/day. Substance use disorder (SUD) risk level: Pending results of Medical Psychology Evaluation for SUD Opioid risk tool (ORT) (Total Score): 3  ORT Scoring interpretation table:  Score <3 = Low Risk for SUD  Score between 4-7 = Moderate Risk for SUD  Score >8 = High Risk for Opioid Abuse   PHQ-2 Depression Scale:  Total score: 0  PHQ-2 Scoring interpretation table: (Score and probability of major depressive disorder)  Score 0 = No depression  Score 1 = 15.4% Probability  Score 2 = 21.1% Probability  Score 3 = 38.4% Probability  Score 4 = 45.5% Probability  Score 5 = 56.4% Probability  Score 6 = 78.6% Probability   PHQ-9 Depression Scale:  Total score: 0  PHQ-9 Scoring interpretation table:  Score 0-4 = No depression  Score 5-9 = Mild depression  Score 10-14 =  Moderate depression  Score 15-19 = Moderately severe depression  Score 20-27 = Severe depression (2.4 times higher risk of SUD and  2.89 times higher risk of overuse)   Pharmacologic Plan: Pending ordered tests and/or consults  Meds  The patient has a current medication list which includes the following prescription(s): acetaminophen, aspirin ec, atorvastatin, clopidogrel, hydrochlorothiazide, isosorbide dinitrate, lisinopril, metoprolol tartrate, multiple vitamins-minerals, nitroglycerin, pantoprazole, and tamsulosin.  Current Outpatient Prescriptions on File Prior to Visit  Medication Sig  . acetaminophen (TYLENOL) 325 MG tablet Take 650 mg by mouth daily as needed for mild pain or headache.   Marland Kitchen aspirin EC 81 MG tablet Take 1 tablet (81 mg total) by mouth daily.  Marland Kitchen atorvastatin (LIPITOR) 80 MG tablet Take 1 tablet (80 mg total) by mouth daily.  . clopidogrel (PLAVIX) 75 MG tablet Take 1 tablet (75 mg total) by mouth daily with breakfast.  . hydrochlorothiazide (HYDRODIURIL) 25 MG tablet Take 25 mg by mouth daily.  . isosorbide dinitrate (ISORDIL) 10 MG tablet Take 20 mg by mouth every morning. Take 10 mg by mouth in the afternoon  . lisinopril (PRINIVIL,ZESTRIL) 20 MG tablet Take 1 tablet (20 mg total) by mouth daily.  . metoprolol tartrate (LOPRESSOR) 25 MG tablet Take 0.5 tablets (12.5 mg total) by mouth at bedtime.  . Multiple Vitamins-Minerals (PRESERVISION AREDS PO) Take 1 tablet by mouth 2 (two) times daily.  . nitroGLYCERIN (NITROSTAT) 0.4 MG SL tablet Place 0.4 mg under the tongue every 5 (five) minutes as needed for chest pain.  . pantoprazole (PROTONIX) 40 MG tablet Take 20 mg by mouth daily.  . tamsulosin (FLOMAX) 0.4 MG CAPS capsule Take 1 capsule (0.4 mg total) by mouth daily.   No current facility-administered medications on file prior to visit.    Imaging Review  Hip Imaging:  Hip-R DG 2-3 views:  Results for orders placed during the hospital encounter of 06/02/17  DG HIP UNILAT WITH PELVIS 2-3 VIEWS RIGHT   Narrative CLINICAL DATA:  Right hip pain for 2 months.  No injury.  EXAM: DG HIP (WITH OR  WITHOUT PELVIS) 2-3V RIGHT  COMPARISON:  None.  FINDINGS: There is no evidence of hip fracture or dislocation. There is narrow bilateral hip joint spaces. Degenerative joint changes of the lower lumbar spine. Vascular stent are identified.  IMPRESSION: No acute fracture or dislocation.  Degenerative joint changes of bilateral hips.   Electronically Signed   By: Abelardo Diesel M.D.   On: 06/02/2017 14:21    Note: Available results from prior imaging studies were reviewed.        ROS  Cardiovascular History: Heart trouble, High blood pressure and Heart attack ( Date: ?) Pulmonary or Respiratory History: No reported pulmonary signs or symptoms such as wheezing and difficulty taking a deep full breath (Asthma), difficulty blowing air out (Emphysema), coughing up mucus (Bronchitis), persistent dry cough, or temporary stoppage of breathing during sleep Neurological History: No reported neurological signs or symptoms such as seizures, abnormal skin sensations, urinary and/or fecal incontinence, being born with an abnormal open spine and/or a tethered spinal cord Review of Past Neurological Studies: No results found for this or any previous visit. Psychological-Psychiatric History: No reported psychological or psychiatric signs or symptoms such as difficulty sleeping, anxiety, depression, delusions or hallucinations (schizophrenial), mood swings (bipolar disorders) or suicidal ideations or attempts Gastrointestinal History: No reported gastrointestinal signs or symptoms such as vomiting or evacuating blood, reflux, heartburn, alternating episodes of diarrhea and constipation, inflamed or scarred liver, or  pancreas or irrregular and/or infrequent bowel movements Genitourinary History: Passing kidney stones Hematological History: Brusing easily Endocrine History: No reported endocrine signs or symptoms such as high or low blood sugar, rapid heart rate due to high thyroid levels, obesity or weight  gain due to slow thyroid or thyroid disease Rheumatologic History: No reported rheumatological signs and symptoms such as fatigue, joint pain, tenderness, swelling, redness, heat, stiffness, decreased range of motion, with or without associated rash Musculoskeletal History: Negative for myasthenia gravis, muscular dystrophy, multiple sclerosis or malignant hyperthermia Work History: Retired  Allergies  Mr. Mierzwa has No Known Allergies.  Laboratory Chemistry  Inflammation Markers No results found for: CRP, ESRSEDRATE (CRP: Acute Phase) (ESR: Chronic Phase) Renal Function Markers Lab Results  Component Value Date   BUN 8 06/03/2016   CREATININE 0.94 06/03/2016   GFRAA >60 06/03/2016   GFRNONAA >60 06/03/2016   Hepatic Function Markers Lab Results  Component Value Date   AST 15 05/28/2016   ALT 13 (L) 05/28/2016   ALBUMIN 3.5 05/28/2016   ALKPHOS 59 05/28/2016   Electrolytes Lab Results  Component Value Date   NA 137 06/03/2016   K 3.6 06/03/2016   CL 104 06/03/2016   CALCIUM 8.7 (L) 06/03/2016   Neuropathy Markers No results found for: [id number] Bone Pathology Markers Lab Results  Component Value Date   ALKPHOS 59 05/28/2016   CALCIUM 8.7 (L) 06/03/2016   Coagulation Parameters Lab Results  Component Value Date   INR 1.14 05/28/2016   LABPROT 14.8 05/28/2016   APTT 37 05/28/2016   PLT 78 (L) 06/03/2016   Cardiovascular Markers Lab Results  Component Value Date   HGB 11.1 (L) 06/03/2016   HCT 35.1 (L) 06/03/2016   Note: Lab results reviewed.  PFSH  Drug: Mr. Obrecht  reports that he does not use drugs. Alcohol:  reports that he does not drink alcohol. Tobacco:  reports that he quit smoking about 27 years ago. His smoking use included Cigarettes. He has a 50.00 pack-year smoking history. He has never used smokeless tobacco. Medical:  has a past medical history of AS (aortic stenosis); CAD (coronary artery disease); Carotid bruit; GERD (gastroesophageal reflux  disease); History of kidney stones; HOH (hard of hearing); HTN (hypertension); Hyperlipidemia; Myocardial infarction ([place]) (1996); Nephrolithiasis; OSA (obstructive sleep apnea); PAD (peripheral artery disease) ([place]) (02/2014); Shortness of breath; and Thrombocytopenia ([place]). Family: family history includes Heart disease in his father and mother.  Past Surgical History:  Procedure Laterality Date  . ABDOMINAL AORTAGRAM N/A 03/06/2014   Procedure: ABDOMINAL Maxcine Ham;  Surgeon: Wellington Hampshire, MD;  Location: [facility] CATH LAB;  Service: Cardiovascular;  Laterality: N/A;  . CARDIAC CATHETERIZATION  02/2014   Severe three-vessel coronary artery disease with patent grafts.   Marland Kitchen CARDIAC CATHETERIZATION N/A 07/31/2015   Procedure: Right/Left Heart Cath and Coronary/Graft Angiography;  Surgeon: Wellington Hampshire, MD;  Location: [facility];  Service: Cardiovascular;  Laterality: N/A;  . CARDIAC CATHETERIZATION N/A 07/31/2015   Procedure: Coronary Stent Intervention;  Surgeon: Wellington Hampshire, MD;  Location: [facility] CV [facility];  Service: Cardiovascular;  Laterality: N/A;  . CARDIAC CATHETERIZATION N/A 05/26/2016   Procedure: Right/Left Heart Cath and Coronary Angiography;  Surgeon: Wellington Hampshire, MD;  Location: [facility] CV LAB;  Service: Cardiovascular;  Laterality: N/A;  . CORONARY ARTERY BYPASS GRAFT  1991   at [facility]. Redone 2004-3 vessels 1st time and 4 second time  . CYSTO     2/3 times for kidney stones  .  INGUINAL HERNIA REPAIR  11/10/2011   Procedure: HERNIA REPAIR INGUINAL ADULT;  Surgeon: Donato Heinz;  Location: [facility];  Service: General;  Laterality: Right;  . LEFT HEART CATHETERIZATION WITH CORONARY/GRAFT ANGIOGRAM N/A 03/06/2014   Procedure: LEFT HEART CATHETERIZATION WITH Beatrix Fetters;  Surgeon: Wellington Hampshire, MD;  Location: [facility] CATH LAB;  Service: Cardiovascular;  Laterality: N/A;  . STOMACH SURGERY     removal of gastric ulcers  . TEE WITHOUT CARDIOVERSION N/A  06/01/2016   Procedure: TRANSESOPHAGEAL ECHOCARDIOGRAM (TEE);  Surgeon: Sherren Mocha, MD;  Location: [facility];  Service: Open Heart Surgery;  Laterality: N/A;  . TRANSCATHETER AORTIC VALVE REPLACEMENT, TRANSFEMORAL N/A 06/01/2016   Procedure: TRANSCATHETER AORTIC VALVE REPLACEMENT, TRANSFEMORAL;  Surgeon: Sherren Mocha, MD;  Location: [facility];  Service: Open Heart Surgery;  Laterality: N/A;   Active Ambulatory Problems    Diagnosis Date Noted  . HLD (hyperlipidemia) 03/04/2008  . Obstructive sleep apnea 03/05/2008  . CENTRAL SLEEP APNEA CONDS CLASSIFIED ELSEWHERE 03/05/2008  . Essential hypertension 03/04/2008  . Coronary atherosclerosis 03/04/2008  . Aortic valve disorder 05/01/2009  . GERD (gastroesophageal reflux disease) 08/17/2013  . Angina pectoris ([place]) 02/14/2014  . PAD (peripheral artery disease) ([place]) 03/22/2014  . Effort angina ([place]) 07/31/2015  . Coronary artery disease 08/01/2015  . Thrombocytopenia ([place])   . Severe aortic stenosis 05/25/2016  . Long term current use of opiate analgesic 08/31/2017  . Long term prescription opiate use 08/31/2017  . Opiate use 08/31/2017  . Chronic low back pain 08/31/2017  . Chronic pain of lower extremity 08/31/2017  . Chronic pain syndrome 08/31/2017  . Chronic sacroiliac joint pain 08/31/2017   Resolved Ambulatory Problems    Diagnosis Date Noted  . No Resolved Ambulatory Problems   Past Medical History:  Diagnosis Date  . AS (aortic stenosis)   . CAD (coronary artery disease)   . Carotid bruit   . GERD (gastroesophageal reflux disease)   . History of kidney stones   . HOH (hard of hearing)   . HTN (hypertension)   . Hyperlipidemia   . Myocardial infarction ([place]) 1996  . Nephrolithiasis   . OSA (obstructive sleep apnea)   . PAD (peripheral artery disease) ([place]) 02/2014  . Shortness of breath   . Thrombocytopenia ([place])    Constitutional Exam  General appearance: Well nourished, well developed, and well hydrated. In no  apparent acute distress Vitals:   08/31/17 1400  BP: (!) 147/53  Pulse: 75  Temp: (!) 97.2 F (36.2 C)  SpO2: 97%  Weight: 190 lb (86.2 kg)  Height: 5' 9"  (1.753 m)   BMI Assessment: Estimated body mass index is 28.06 kg/m as calculated from the following:   Height as of this encounter: 5' 9"  (1.753 m).   Weight as of this encounter: 190 lb (86.2 kg).  BMI interpretation table: BMI level Category Range association with higher incidence of chronic pain  <18 kg/m2 Underweight   18.5-24.9 kg/m2 Ideal body weight   25-29.9 kg/m2 Overweight Increased incidence by 20%  30-34.9 kg/m2 Obese (Class I) Increased incidence by 68%  35-39.9 kg/m2 Severe obesity (Class II) Increased incidence by 136%  >40 kg/m2 Extreme obesity (Class III) Increased incidence by 254%   BMI Readings from Last 4 Encounters:  08/31/17 28.06 kg/m  05/23/17 29.10 kg/m  05/13/17 28.24 kg/m  10/29/16 27.62 kg/m   Wt Readings from Last 4 Encounters:  08/31/17 190 lb (86.2 kg)  05/23/17 191 lb 6.4 oz (86.8 kg)  05/13/17 191 lb  4 oz (86.8 kg)  10/29/16 187 lb (84.8 kg)  Psych/Mental status: Alert, oriented x 3 (person, place, & time)       Eyes: PERLA Respiratory: No evidence of acute respiratory distress  Cervical Spine Exam  Inspection: No masses, redness, or swelling Alignment: Symmetrical Functional ROM: Unrestricted ROM      Stability: No instability detected Muscle strength & Tone: Functionally intact Sensory: Unimpaired Palpation: No palpable anomalies              Upper Extremity (UE) Exam    Side: Right upper extremity  Side: Left upper extremity  Inspection: No masses, redness, swelling, or asymmetry. No contractures  Inspection: No masses, redness, swelling, or asymmetry. No contractures  Functional ROM: Unrestricted ROM          Functional ROM: Unrestricted ROM          Muscle strength & Tone: Functionally intact  Muscle strength & Tone: Functionally intact  Sensory: Unimpaired  Sensory:  Unimpaired  Palpation: No palpable anomalies              Palpation: No palpable anomalies              Specialized Test(s): Deferred         Specialized Test(s): Deferred          Thoracic Spine Exam  Inspection: No masses, redness, or swelling Alignment: Symmetrical Functional ROM: Unrestricted ROM Stability: No instability detected Sensory: Unimpaired Muscle strength & Tone: No palpable anomalies  Lumbar Spine Exam  Inspection: No masses, redness, or swelling Alignment: Symmetrical Functional ROM: Unrestricted ROM      Stability: No instability detected Muscle strength & Tone: Functionally intact Sensory: Unimpaired Palpation: Complains of area being tender to palpation       Provocative Tests: Lumbar Hyperextension and rotation test: Positive on the left for facet joint pain. Patrick's Maneuver: Positive for left-sided S-I arthralgia and for bilateral hip arthralgia  Gait & Posture Assessment  Ambulation: Unassisted Gait: Antalgic Posture: WNL   Lower Extremity Exam    Side: Right lower extremity  Side: Left lower extremity  Inspection: No masses, redness, swelling, or asymmetry. No contractures  Inspection: No masses, redness, swelling, or asymmetry. No contractures  Functional ROM: Unrestricted ROM          Functional ROM: Unrestricted ROM          Muscle strength & Tone: Functionally intact  Muscle strength & Tone: Functionally intact  Sensory: Unimpaired  Sensory: Unimpaired  Palpation: No palpable anomalies  Palpation: No palpable anomalies   Assessment  Primary Diagnosis & Pertinent Problem List: The primary encounter diagnosis was Chronic bilateral low back pain with bilateral sciatica. Diagnoses of Chronic pain of both lower extremities, Chronic pain syndrome, Long term current use of opiate analgesic, and Chronic sacroiliac joint pain were also pertinent to this visit.  Visit Diagnosis: 1. Chronic bilateral low back pain with bilateral sciatica   2. Chronic  pain of both lower extremities   3. Chronic pain syndrome   4. Long term current use of opiate analgesic   5. Chronic sacroiliac joint pain    Plan of Care  Initial treatment plan:  Please be advised that as per protocol, today's visit has been an evaluation only. We have not taken over the patient's controlled substance management.  Problem-specific plan: No problem-specific Assessment & Plan notes found for this encounter.  Ordered Lab-work, Procedure(s), Referral(s), & Consult(s): Orders Placed This Encounter  Procedures  . DG Si Joints  .  DG Lumb Spine Flex&Ext Only  . Compliance Drug Analysis, Ur  . Comp. Metabolic Panel (12)  . C-reactive protein  . Sedimentation rate  . Magnesium  . 25-Hydroxyvitamin D Lcms D2+D3  . Vitamin B12  . Ambulatory referral to Psychology   Pharmacotherapy: Medications ordered:  No orders of the defined types were placed in this encounter.  Medications administered during this visit: Mr. Chaloux had no medications administered during this visit.   Pharmacotherapy under consideration:  Opioid Analgesics: The patient was informed that there is no guarantee that he would be a candidate for opioid analgesics. The decision will be made following CDC guidelines. This decision will be based on the results of diagnostic studies, as well as Mr. Whiters risk profile.  Membrane stabilizer: To be determined at a later time Muscle relaxant: To be determined at a later time NSAID: To be determined at a later time Other analgesic(s): To be determined at a later time   Interventional therapies under consideration: Mr. Bogosian was informed that there is no guarantee that he would be a candidate for interventional therapies. The decision will be based on the results of diagnostic studies, as well as Mr. Fassnacht risk profile.  Possible procedure(s): Diagnostic Bilateral LESI Diagnostic Bilateral Lumbar facet block Diagnostic Bilateral SI joint injection Possible  Bilateral lumbar facet RFA Diagnostic bilateral hip injections   Provider-requested follow-up: No Follow-up on file.  No future appointments.  Primary Care Physician: Vesta Mixer Location: [facility] Outpatient Pain Management Facility Note by: Dionisio David NP Date: 08/31/2017; Time: 3:54 PM  Pain Score Disclaimer: We use the NRS-11 scale. This is a self-reported, subjective measurement of pain severity with only modest accuracy. It is used primarily to identify changes within a particular patient. It must be understood that outpatient pain scales are significantly less accurate that those used for research, where they can be applied under ideal controlled circumstances with minimal exposure to variables. In reality, the score is likely to be a combination of pain intensity and pain affect, where pain affect describes the degree of emotional arousal or changes in action readiness caused by the sensory experience of pain. Factors such as social and work situation, setting, emotional state, anxiety levels, expectation, and prior pain experience may influence pain perception and show large inter-individual differences that may also be affected by time variables.  Patient instructions provided during this appointment: Patient Instructions    ____________________________________________________________________________________________  Appointment Policy Summary  It is our goal and responsibility to provide the medical community with assistance in the evaluation and management of patients with chronic pain. Unfortunately our resources are limited. Because we do not have an unlimited amount of time, or available appointments, we are required to closely monitor and manage their use. The following rules exist to maximize their use:  Patient's responsibilities: 1. Punctuality:  At what time should I arrive? You should be physically present in our office 30 minutes before your scheduled  appointment. Your scheduled appointment is with your assigned healthcare provider. However, it takes 5-10 minutes to be "checked-in", and another 15 minutes for the nurses to do the admission. If you arrive to our office at the time you were given for your appointment, you will end up being at least 20-25 minutes late to your appointment with the provider. 2. Tardiness:  What happens if I arrive only a few minutes after my scheduled appointment time? You will need to reschedule your appointment. The cutoff is your appointment time. This is why it  is so important that you arrive at least 30 minutes before that appointment. If you have an appointment scheduled for 10:00 AM and you arrive at 10:01, you will be required to reschedule your appointment.  3. Plan ahead:  Always assume that you will encounter traffic on your way in. Plan for it. If you are dependent on a driver, make sure they understand these rules and the need to arrive early. 4. Other appointments and responsibilities:  Avoid scheduling any other appointments before or after your pain clinic appointments.  5. Be prepared:  Write down everything that you need to discuss with your healthcare provider and give this information to the admitting nurse. Write down the medications that you will need refilled. Bring your pills and bottles (even the empty ones), to all of your appointments, except for those where a procedure is scheduled. 6. No children or pets:  Find someone to take care of them. It is not appropriate to bring them in. 7. Scheduling changes:  We request "advanced notification" of any changes or cancellations. 8. Advanced notification:  Defined as a time period of more than 24 hours prior to the originally scheduled appointment. This allows for the appointment to be offered to other patients. 9. Rescheduling:  When a visit is rescheduled, it will require the cancellation of the original appointment. For this reason they both fall  within the category of "Cancellations".  10. Cancellations:  They require advanced notification. Any cancellation less than 24 hours before the  appointment will be recorded as a "No Show". 11. No Show:  Defined as an unkept appointment where the patient failed to notify or declare to the practice their intention or inability to keep the appointment.  Corrective process for repeat offenders:  1. Tardiness: Three (3) episodes of rescheduling due to late arrivals will be recorded as one (1) "No Show". 2. Cancellation or reschedule: Three (3) cancellations or rescheduling will be recorded as one (1) "No Show". 3. "No Shows": Three (3) "No Shows" within a 12 month period will result in discharge from the practice.  ____________________________________________________________________________________________  ____________________________________________________________________________________________  Pain Scale  Introduction: The pain score used by this practice is the Verbal Numerical Rating Scale (VNRS-11). This is an 11-point scale. It is for adults and children 10 years or older. There are significant differences in how the pain score is reported, used, and applied. Forget everything you learned in the past and learn this scoring system.  General Information: The scale should reflect your current level of pain. Unless you are specifically asked for the level of your worst pain, or your average pain. If you are asked for one of these two, then it should be understood that it is over the past 24 hours.  Basic Activities of Daily Living (ADL): Personal hygiene, dressing, eating, transferring, and using restroom.  Instructions: Most patients tend to report their level of pain as a combination of two factors, their physical pain and their psychosocial pain. This last one is also known as "suffering" and it is reflection of how physical pain affects you socially and psychologically. From now on,  report them separately. From this point on, when asked to report your pain level, report only your physical pain. Use the following table for reference.  Pain Clinic Pain Levels (0-5/10)  Pain Level Score  Description  No Pain 0   Mild pain 1 Nagging, annoying, but does not interfere with basic activities of daily living (ADL). Patients are able to eat, bathe, get  dressed, toileting (being able to get on and off the toilet and perform personal hygiene functions), transfer (move in and out of bed or a chair without assistance), and maintain continence (able to control bladder and bowel functions). Blood pressure and heart rate are unaffected. A normal heart rate for a healthy adult ranges from 60 to 100 bpm (beats per minute).   Mild to moderate pain 2 Noticeable and distracting. Impossible to hide from other people. More frequent flare-ups. Still possible to adapt and function close to normal. It can be very annoying and may have occasional stronger flare-ups. With discipline, patients may get used to it and adapt.   Moderate pain 3 Interferes significantly with activities of daily living (ADL). It becomes difficult to feed, bathe, get dressed, get on and off the toilet or to perform personal hygiene functions. Difficult to get in and out of bed or a chair without assistance. Very distracting. With effort, it can be ignored when deeply involved in activities.   Moderately severe pain 4 Impossible to ignore for more than a few minutes. With effort, patients may still be able to manage work or participate in some social activities. Very difficult to concentrate. Signs of autonomic nervous system discharge are evident: dilated pupils (mydriasis); mild sweating (diaphoresis); sleep interference. Heart rate becomes elevated (>115 bpm). Diastolic blood pressure (lower number) rises above 100 mmHg. Patients find relief in laying down and not moving.   Severe pain 5 Intense and extremely unpleasant. Associated  with frowning face and frequent crying. Pain overwhelms the senses.  Ability to do any activity or maintain social relationships becomes significantly limited. Conversation becomes difficult. Pacing back and forth is common, as getting into a comfortable position is nearly impossible. Pain wakes you up from deep sleep. Physical signs will be obvious: pupillary dilation; increased sweating; goosebumps; brisk reflexes; cold, clammy hands and feet; nausea, vomiting or dry heaves; loss of appetite; significant sleep disturbance with inability to fall asleep or to remain asleep. When persistent, significant weight loss is observed due to the complete loss of appetite and sleep deprivation.  Blood pressure and heart rate becomes significantly elevated. Caution: If elevated blood pressure triggers a pounding headache associated with blurred vision, then the patient should immediately seek attention at an urgent or emergency care unit, as these may be signs of an impending stroke.    Emergency Department Pain Levels (6-10/10)  Emergency Room Pain 6 Severely limiting. Requires emergency care and should not be seen or managed at an outpatient pain management facility. Communication becomes difficult and requires great effort. Assistance to reach the emergency department may be required. Facial flushing and profuse sweating along with potentially dangerous increases in heart rate and blood pressure will be evident.   Distressing pain 7 Self-care is very difficult. Assistance is required to transport, or use restroom. Assistance to reach the emergency department will be required. Tasks requiring coordination, such as bathing and getting dressed become very difficult.   Disabling pain 8 Self-care is no longer possible. At this level, pain is disabling. The individual is unable to do even the most "basic" activities such as walking, eating, bathing, dressing, transferring to a bed, or toileting. Fine motor skills are  lost. It is difficult to think clearly.   Incapacitating pain 9 Pain becomes incapacitating. Thought processing is no longer possible. Difficult to remember your own name. Control of movement and coordination are lost.   The worst pain imaginable 10 At this level, most patients pass out from  pain. When this level is reached, collapse of the autonomic nervous system occurs, leading to a sudden drop in blood pressure and heart rate. This in turn results in a temporary and dramatic drop in blood flow to the brain, leading to a loss of consciousness. Fainting is one of the body's self defense mechanisms. Passing out puts the brain in a calmed state and causes it to shut down for a while, in order to begin the healing process.    Summary: 1. Refer to this scale when providing [place] with your pain level. 2. Be accurate and careful when reporting your pain level. This will help with your care. 3. Over-reporting your pain level will lead to loss of credibility. 4. Even a level of 1/10 means that there is pain and will be treated at our facility. 5. High, inaccurate reporting will be documented as "Symptom Exaggeration", leading to loss of credibility and suspicions of possible secondary gains such as obtaining more narcotics, or wanting to appear disabled, for fraudulent reasons. 6. Only pain levels of 5 or below will be seen at our facility. 7. Pain levels of 6 and above will be sent to the Emergency Department and the appointment cancelled. ____________________________________________________________________________________________

## 2017-08-31 NOTE — Patient Instructions (Signed)

## 2017-09-01 ENCOUNTER — Encounter: Payer: Self-pay | Admitting: Nurse Practitioner

## 2017-09-01 DIAGNOSIS — G8929 Other chronic pain: Secondary | ICD-10-CM | POA: Insufficient documentation

## 2017-09-01 DIAGNOSIS — M25559 Pain in unspecified hip: Secondary | ICD-10-CM

## 2017-09-05 ENCOUNTER — Telehealth: Payer: Self-pay

## 2017-09-05 LAB — COMPLIANCE DRUG ANALYSIS, UR

## 2017-09-05 NOTE — Telephone Encounter (Signed)
-----   Message from Vevelyn Francois, NP sent at 09/05/2017  2:48 PM EDT ----- Regarding: Images Please call Mr Fonda  His daughter stated that he had images of his L spine. However I am not able to locate them in Care Everywhere. She will need to bring in or fax in a copy so that we can have this when he is seen by Dr Delane Ginger. Thanks

## 2017-09-05 NOTE — Telephone Encounter (Signed)
Called pt and left message on Am. Instructed on what was needed. Instructed pt to call back if any questions

## 2017-09-06 LAB — COMP. METABOLIC PANEL (12)
A/G RATIO: 2 (ref 1.2–2.2)
ALBUMIN: 4.6 g/dL (ref 3.5–4.8)
AST: 19 IU/L (ref 0–40)
Alkaline Phosphatase: 60 IU/L (ref 39–117)
BILIRUBIN TOTAL: 1.4 mg/dL — AB (ref 0.0–1.2)
BUN / CREAT RATIO: 17 (ref 10–24)
BUN: 17 mg/dL (ref 8–27)
CHLORIDE: 101 mmol/L (ref 96–106)
Calcium: 9.8 mg/dL (ref 8.6–10.2)
Creatinine, Ser: 1.03 mg/dL (ref 0.76–1.27)
GFR calc Af Amer: 80 mL/min/{1.73_m2} (ref >59)
GFR calc non Af Amer: 69 mL/min/{1.73_m2} (ref >59)
GLOBULIN, TOTAL: 2.3 g/dL (ref 1.5–4.5)
Glucose: 113 mg/dL — ABNORMAL HIGH (ref 65–99)
POTASSIUM: 3.5 mmol/L (ref 3.5–5.2)
Sodium: 141 mmol/L (ref 134–144)
TOTAL PROTEIN: 6.9 g/dL (ref 6.0–8.5)

## 2017-09-06 LAB — 25-HYDROXY VITAMIN D LCMS D2+D3
25-Hydroxy, Vitamin D-2: <1 ng/mL
25-Hydroxy, Vitamin D: 31 ng/mL

## 2017-09-06 LAB — C-REACTIVE PROTEIN: CRP: 0.4 mg/L (ref 0.0–4.9)

## 2017-09-06 LAB — VITAMIN B12: Vitamin B-12: 416 pg/mL (ref 232–1245)

## 2017-09-06 LAB — SEDIMENTATION RATE: SED RATE: 3 mm/h (ref 0–30)

## 2017-09-06 LAB — MAGNESIUM: MAGNESIUM: 1.8 mg/dL (ref 1.6–2.3)

## 2017-09-06 LAB — 25-HYDROXYVITAMIN D LCMS D2+D3: 25-HYDROXY, VITAMIN D-3: 31 ng/mL

## 2017-09-06 NOTE — Progress Notes (Signed)
Results were reviewed and found to be: abnormal, but not significant  No acute injury or pathology identified  Review would suggest interventional pain management techniques may be of benefit

## 2017-09-06 NOTE — Progress Notes (Signed)
Results were reviewed and found to be: mildly abnormal  No acute injury or pathology identified  Review would suggest interventional pain management techniques may be of benefit 

## 2017-09-13 ENCOUNTER — Ambulatory Visit: Payer: Medicare Other | Admitting: Psychiatry

## 2017-09-20 ENCOUNTER — Encounter: Payer: Self-pay | Admitting: Psychiatry

## 2017-09-20 ENCOUNTER — Ambulatory Visit (INDEPENDENT_AMBULATORY_CARE_PROVIDER_SITE_OTHER): Payer: Medicare Other | Admitting: Psychiatry

## 2017-09-20 VITALS — BP 134/48 | HR 62 | Temp 98.4°F | Wt 189.8 lb

## 2017-09-20 DIAGNOSIS — R52 Pain, unspecified: Secondary | ICD-10-CM | POA: Diagnosis not present

## 2017-09-20 DIAGNOSIS — Z0389 Encounter for observation for other suspected diseases and conditions ruled out: Secondary | ICD-10-CM

## 2017-09-20 DIAGNOSIS — F489 Nonpsychotic mental disorder, unspecified: Secondary | ICD-10-CM

## 2017-09-20 NOTE — Progress Notes (Addendum)
Psychiatric Initial Adult Assessment   Patient Identification: Matthew [name] MRN:  [id number] Date of Evaluation:  09/20/2017 Referral Source: Dr.Naveira  Chief Complaint:  " I am here for clearance for pain management.' Chief Complaint    Establish Care     Visit Diagnosis:    ICD-10-CM   1. No diagnosis on Axis I Z03.89     History of Present Illness:  Matthew [name] is a 89 y old CM who is married , lives with his wife in [place] ,[place]  with no past hx of mental health problems , presented today for psychiatric clearance since his pain management provider requested the same.  Matthew [name] presented with his daughter Matthew [name] who assisted with providing collateral information.  Per Matthew [name] he does not have any mood sx, sleep issues , psychosis.  He denies anger issues, impulsivity, sx of mania and so on.  He denies anxiety attacks.  He denies hx of sexual or physical abuse.  He denies any hx of being on psychotropic medications.   He does report multiple medical issues, including AS, surgery for the same, cardiac issues , OSA and so on. He reports he does not use the CPAP since he does not tolerate it . However , he reports sleep as restful.    Associated Signs/Symptoms: Depression Symptoms:  denies (Hypo) Manic Symptoms:  denies Anxiety Symptoms:  denies Psychotic Symptoms:  denies PTSD Symptoms: Negative  Past Psychiatric History: denies IP admissions, past suicide attempts or treatments.  Previous Psychotropic Medications: No   Substance Abuse History in the last 12 months:  No. 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  Consequences of Substance Abuse: Negative  Past Medical History:  Past Medical History:  Diagnosis Date  . AS (aortic stenosis)    a. Echo 6/10: EF 55% mild AS; b. echo 06/2015; EF 55-60%, GR1DD, moderate AS, Peak velocity  (S): 346 cm/s. Mean gradientS): 28 mm Hg. Peak gradient (S): 48 mm Hg. Valve area (VTI): 1.18 cm2;   c. Echo 4/17 - mild LVH, EF 55-60%, no RWMA, Gr 1 DD, mod to severe AS (mean 28 mmHg, peak 46 mmHg), mild LAE  . CAD (coronary artery disease)    a. MI 1996 w/ CABG 1996; b.redo CABG 06/2003; c. Myoview 7/09: EF 54% inferobasal infarct, no ischemia. Myoview 6/10 EF 43% inf wall infarct. no ischemia; c. cath 8/16 s/p DES to VG-OM2. OTW 3VD w/ patent VG->RPDA, VG->OM3,  & LIMA->LAD.  EF 55-65%.  . Carotid bruit    2009 0-39% on dopplers bilatrally  . GERD (gastroesophageal reflux disease)   . History of kidney stones   . HOH (hard of hearing)   . HTN (hypertension)   . Hyperlipidemia   . Myocardial infarction ([place]) 1996  . Nephrolithiasis   . OSA (obstructive sleep apnea)   . PAD (peripheral artery disease) ([place]) 02/2014   Subtotal occlusion of right common iliac artery and 70% stenosis in the left common iliac artery. Status post bilateral kissing stent placement. 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 Shortness of breath    with exertion  . Thrombocytopenia ([place])     Past Surgical History:  Procedure Laterality Date  . ABDOMINAL AORTAGRAM N/A  03/06/2014   Procedure: ABDOMINAL Maxcine Ham;  Surgeon: Wellington Hampshire, MD;  [facility] CATH LAB;  Service: Cardiovascular;  Laterality: N/A;  . CARDIAC CATHETERIZATION  02/2014   Severe three-vessel coronary artery disease with patent grafts.   Marland Kitchen CARDIAC CATHETERIZATION N/A 07/31/2015   Procedure: Right/Left Heart Cath and Coronary/Graft Angiography;  Surgeon: Wellington Hampshire, MD;  Location: [facility] CV LAB;  Service: Cardiovascular;  Laterality: N/A;  . CARDIAC CATHETERIZATION N/A 07/31/2015   Procedure: Coronary Stent Intervention;  Surgeon: Wellington Hampshire, MD;  Location:  [facility] CV LAB;  Service: Cardiovascular;  Laterality: N/A;  . CARDIAC CATHETERIZATION N/A 05/26/2016   Procedure: Right/Left Heart Cath and Coronary Angiography;  Surgeon: Wellington Hampshire, MD;  Location: [facility] CV LAB;  Service: Cardiovascular;  Laterality: N/A;  . CORONARY ARTERY BYPASS GRAFT  1991   at [facility] Redone 2004-3 vessels 1st time and 4 second time  . CYSTO     2/3 times for kidney stones  . INGUINAL HERNIA REPAIR  11/10/2011   Procedure: HERNIA REPAIR INGUINAL ADULT;  Surgeon: Donato Heinz;  Location: [facility];  Service: General;  Laterality: Right;  . LEFT HEART CATHETERIZATION WITH CORONARY/GRAFT ANGIOGRAM N/A 03/06/2014   Procedure: LEFT HEART CATHETERIZATION WITH Beatrix Fetters;  Surgeon: Wellington Hampshire, MD;  Location: [facility] CATH LAB;  Service: Cardiovascular;  Laterality: N/A;  . STOMACH SURGERY     removal of gastric ulcers  . TEE WITHOUT CARDIOVERSION N/A 06/01/2016   Procedure: TRANSESOPHAGEAL ECHOCARDIOGRAM (TEE);  Surgeon: Sherren Mocha, MD;  Location: [facility];  Service: Open Heart Surgery;  Laterality: N/A;  . TRANSCATHETER AORTIC VALVE REPLACEMENT, TRANSFEMORAL N/A 06/01/2016   Procedure: TRANSCATHETER AORTIC VALVE REPLACEMENT, TRANSFEMORAL;  Surgeon: Sherren Mocha, MD;  Location: [facility];  Service: Open Heart Surgery;  Laterality: N/A;    Family Psychiatric History: Denies hx of depression, anxiety, suicide attempts.  Family History:  Family History  Problem Relation Age of Onset  . Heart disease Father   . Heart disease Mother     Social History:   Social History   Social History  . Marital status: Married    Spouse name: N/A  . Number of children: 2  . Years of education: N/A   Occupational History  . Retired    Social History Main Topics  . Smoking status: Former Smoker    Packs/day: 1.00    Years: 50.00    Types: Cigarettes    Quit date: 01/24/1990  . Smokeless tobacco: Never Used  . 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  Drug use: No  . Sexual activity: No   Other Topics Concern  . Not on file   Social History Narrative   Married an lives with wife in [place]. Retired form [employer].     Additional Social History: Married since the past 31 yrs, lives in [place] , retired from [employer]. Denies being in [employer]. Denies any legal issues.  Allergies:  No Known Allergies  Metabolic Disorder Labs: Lab Results  Component Value Date   HGBA1C 5.1 05/28/2016   MPG 100 05/28/2016   No results found for: PROLACTIN Lab Results  Component Value Date   CHOL 149 08/11/2015   TRIG 224 (H) 08/11/2015   HDL 41 08/11/2015   CHOLHDL 3.6 08/11/2015   VLDL 18 05/19/2009   LDLCALC 63 08/11/2015   LDLCALC 75 05/19/2009     Current Medications: Current Outpatient Prescriptions  Medication Sig Dispense Refill  .  acetaminophen (TYLENOL) 325 MG tablet Take 650 mg by mouth daily as needed for mild pain or headache.     Marland Kitchen aspirin EC 81 MG tablet Take 1 tablet (81 mg total) by mouth daily. 90 tablet 3  . atorvastatin (LIPITOR) 80 MG tablet Take 1 tablet (80 mg total) by mouth daily. 90 tablet 3  . clopidogrel (PLAVIX) 75 MG tablet Take 1 tablet (75 mg total) by mouth daily with breakfast. 90 tablet 3  . hydrochlorothiazide (HYDRODIURIL) 25 MG tablet Take 25 mg by mouth daily.    . isosorbide dinitrate (ISORDIL) 10 MG tablet Take 20 mg by mouth every morning. Take 10 mg by mouth in the afternoon    . lisinopril (PRINIVIL,ZESTRIL) 20 MG tablet Take 1 tablet (20 mg total) by mouth daily. 90 tablet 3  . metoprolol tartrate (LOPRESSOR) 25 MG tablet Take 0.5 tablets (12.5 mg total) by mouth at bedtime. 45 tablet 3  . Multiple Vitamins-Minerals (PRESERVISION AREDS PO) Take 1 tablet by mouth 2 (two) times daily.    . nitroGLYCERIN (NITROSTAT) 0.4 MG SL tablet Place 0.4 mg under the tongue every 5 (five) minutes as needed for chest pain.    . pantoprazole (PROTONIX) 40 MG tablet Take 20 mg by mouth  daily.    . tamsulosin (FLOMAX) 0.4 MG CAPS capsule Take 1 capsule (0.4 mg total) by mouth daily. 30 capsule 3  . traMADol (ULTRAM) 50 MG tablet      No current facility-administered medications for this visit.     Neurologic: Headache: No Seizure: No Paresthesias:No  Musculoskeletal: Strength & Muscle Tone: within normal limits Gait & Station: normal Patient leans: N/A  Psychiatric Specialty Exam: Review of Systems  Musculoskeletal: Positive for back pain.  Psychiatric/Behavioral: Negative for depression, hallucinations, memory loss, substance abuse and suicidal ideas. The patient is not nervous/anxious and does not have insomnia.   All other systems reviewed and are negative.   Blood pressure (!) 134/48, pulse 62, temperature 98.4 F (36.9 C), temperature source Oral, weight 189 lb 12.8 oz (86.1 kg).Body mass index is 28.03 kg/m.  General Appearance: Casual  Eye Contact:  Fair  Speech:  Normal Rate  Volume:  Normal  Mood:  Euthymic  Affect:  Congruent  Thought Process:  Goal Directed and Descriptions of Associations: Intact  Orientation:  Full (Time, Place, and Person)  Thought Content:  Logical  Suicidal Thoughts:  No  Homicidal Thoughts:  No  Memory:  Immediate;   Fair Recent;   Fair Remote;   Fair  Judgement:  Fair  Insight:  Fair  Psychomotor Activity:  Normal  Concentration:  Concentration: Fair and Attention Span: Fair  Recall:  [employer] of Knowledge:Fair  Language: Fair  Akathisia:  No  Handed: RIGHT  AIMS (if indicated):  NA  Assets:  Social Support  ADL's:  Intact  Cognition: WNL  Sleep:  fair    Treatment Plan Summary:Matthew [name] is a 64 y old CM who is married , retired , very active physically , has some back pain issues. He went for pain management and was referred to [facility] for a psychiatric clearance.  Matthew [name] as well as per collateral information from daughter who was present - denies any mood sx, anxiety issues , sleep problems or psychosis. He  also denies past hx of mental health treatment. Denies current alcohol or drug abuse .  Matthew [name] appeared to be alert, oriented to self, situation, place, had good memory - immediate and recent/remote , could follow instructions, and  appeared to have good insight. Reviewed medical records in EHR. Collateral information obtained from daughter as noted above. MMSE completed - he scored good on it.   Plan See below   Hence at this time Matthew [name] does not need any psychiatric treatment or follow up . Follow up as needed if he has problems in the future.   Ursula Alert, MD 10/9/20183:37 PM

## 2017-09-27 NOTE — Progress Notes (Addendum)
Patient's Name: Matthew [name]  MRN: [id number]  Referring Provider: Vesta Mixer  DOB: November 13, 1936  PCP: Vesta Mixer  DOS: 09/28/2017  Note by: Gaspar Cola, MD  Service setting: Ambulatory outpatient  Specialty: Interventional Pain Management  Location: [facility] (AMB) Pain Management Facility    Patient type: Established   Primary Reason(s) for Visit: Encounter for evaluation before starting new chronic pain management plan of care (Level of risk: moderate) CC: Back Pain (lower)  HPI  Matthew [name] is a 81 y.o. year old, male patient, who comes today for a follow-up evaluation to review the test results and decide on a treatment plan. He has HLD (hyperlipidemia); Obstructive sleep apnea; Essential hypertension; Coronary atherosclerosis; Aortic valve Replacement; Angina pectoris ([place]); PAD (peripheral artery disease) ([place]); Effort angina ([place]); Coronary artery disease; Thrombocytopenia ([place]); Severe aortic stenosis; Long term current use of opiate analgesic; Long term prescription opiate use; Opiate use; Chronic low back pain (Primary Area of Pain) (Bilateral) (L>R); Chronic pain of lower extremity; Chronic pain syndrome; Chronic sacroiliac joint pain (Bilateral) (L>R); Chronic hip pain; Pain; Long term current use of anticoagulant (Plavix); History of MI (myocardial infarction) (Last 2004); History of alcoholism ([place]); Neurogenic pain; Lumbar facet syndrome (Bilateral) (L>R); DDD (degenerative disc disease), lumbar; Lumbar facet osteoarthritis; Lumbar spondylosis; and Gastroesophageal reflux disease without esophagitis on his problem list. His primarily concern today is the Back Pain (lower)  Pain Assessment: Location: Lower Back Radiating: down both butt cheeks,down the back of the legs to the calf;numbness in the left foot Onset: More than a month ago Duration: Chronic pain Quality: Aching, Constant, Radiating, Sharp Severity: 6 /10 (self-reported pain score)  Note: Reported  level is compatible with observation.                   When using our objective Pain Scale, levels between 6 and 10/10 are said to belong in an emergency room, as it progressively worsens from a 6/10, described as severely limiting, requiring emergency care not usually available at an outpatient pain management facility. At a 6/10 level, communication becomes difficult and requires great effort. Assistance to reach the emergency department may be required. Facial flushing and profuse sweating along with potentially dangerous increases in heart rate and blood pressure will be evident. Effect on ADL: pace self Timing: Constant Modifying factors: medicine  Matthew [name] comes in today for a follow-up visit after his initial evaluation on 09/05/2017. Today we went over the results of his tests. These were explained in "Layman's terms". During today's appointment we went over my diagnostic impression, as well as the proposed treatment plan.  According to the patient and daughter his primary area of pain is in his lower back. He denies any precipitating factors. He admits that the left side is greater than the right. He denies any previous surgery, interventional therapies or physical therapy. He did have recent images; xray.   His second area of pain is in his lower extremities. The left side is worse than the right. The pain goes down into his lower calf bilaterally. He admits that he does have numbness and tingling in his left foot only. He does have some weakness in his legs but denies any recent falls. He denies any previous surgery, interventional therapies or physical therapy or recent images.   In considering the treatment plan options, [name] was reminded that I no longer take patients for medication management only. I asked him to let me know if he  had no intention of taking advantage of the interventional therapies, so that we could make arrangements to provide this space to someone interested. I also  made it clear that undergoing interventional therapies for the purpose of getting pain medications is very inappropriate on the part of a patient, and it will not be tolerated in this practice. This type of behavior would suggest true addiction and therefore it requires referral to an addiction specialist.   Further details on both, my assessment(s), as well as the proposed treatment plan, please see below.  Controlled Substance Pharmacotherapy Assessment REMS (Risk Evaluation and Mitigation Strategy)  Analgesic: None (the only prescription found was for Tylenol No. 3 #10 written for the patient on 05/27/2016 and filled on the same day)(for post-op pain). Highest recorded MME/day: 60 mg/day MME/day: 0 mg/day Pill Count: None expected due to no prior prescriptions written by our practice. [name], RN  09/28/2017  9:42 AM  Sign at close encounter Safety precautions to be maintained throughout the outpatient stay will include: orient to surroundings, keep bed in low position, maintain call bell within reach at all times, provide assistance with transfer out of bed and ambulation. FYI: has 2 tramadol pills in a pill bottle and  States "has 15-20" more pills at home,rx written by Dr.Feng Markus Jarvis  In [place], [place]   Pharmacokinetics: Liberation and absorption (onset of action): WNL Distribution (time to peak effect): WNL Metabolism and excretion (duration of action): WNL         Pharmacodynamics: Desired effects: Analgesia: [name] reports >50% benefit. Functional ability: Patient reports that medication allows him to accomplish basic ADLs Clinically meaningful improvement in function (CMIF): Sustained CMIF goals met Perceived effectiveness: Described as relatively effective, allowing for increase in activities of daily living (ADL) Undesirable effects: Side-effects or Adverse reactions: None reported Monitoring: [place] PMP: Online review of the past 30-monthperiod previously conducted. Not  applicable at this point since we have not taken over the patient's medication management yet. List of all Serum Drug Screening Test(s):  No results found for: AMPHSCRSER, BARBSCRSER, BENZOSCRSER, COCAINSCRSER, PCPSCRSER, THCSCRSER, OPIATESCRSER, OPryor PTroupList of all UDS test(s) done:  Lab Results  Component Value Date   SUMMARY FINAL 08/31/2017   Last UDS on record: Summary  Date Value Ref Range Status  08/31/2017 FINAL  Final    Comment:    ==================================================================== TOXASSURE COMP DRUG ANALYSIS,UR ==================================================================== Test                             Result       Flag       Units Drug Present and Declared for Prescription Verification   Acetaminophen                  PRESENT      EXPECTED Drug Present not Declared for Prescription Verification   Tramadol                       >5102        UNEXPECTED ng/mg creat   O-Desmethyltramadol            5066         UNEXPECTED ng/mg creat   N-Desmethyltramadol            1382         UNEXPECTED ng/mg creat    Source of tramadol is a prescription medication.    O-desmethyltramadol and  N-desmethyltramadol are expected    metabolites of tramadol. Drug Absent but Declared for Prescription Verification   Salicylate                     Not Detected UNEXPECTED    Aspirin, as indicated in the declared medication list, is not    always detected even when used as directed.   Metoprolol                     Not Detected UNEXPECTED ==================================================================== Test                      Result    Flag   Units      Ref Range   Creatinine              98               mg/dL      >=20 ==================================================================== Declared Medications:  The flagging and interpretation on this report are based on the  following declared medications.  Unexpected results may arise from   inaccuracies in the declared medications.  **Note: The testing scope of this panel includes these medications:  Metoprolol  **Note: The testing scope of this panel does not include small to  moderate amounts of these reported medications:  Acetaminophen  Aspirin  **Note: The testing scope of this panel does not include following  reported medications:  Atorvastatin  Clopidogrel  Hydrochlorothiazide (HCTZ)  Isosorbide Mononitrate  Lisinopril  Multivitamin  Nitroglycerin  Pantoprazole  Tamsulosin ==================================================================== For clinical consultation, please call [phone]. ====================================================================    UDS interpretation: No unexpected findings.          Medication Assessment Form: Patient introduced to form today Treatment compliance: Treatment may start today if patient agrees with proposed plan. Evaluation of compliance is not applicable at this point Risk Assessment Profile: Aberrant behavior: See initial evaluations. None observed or detected today Comorbid factors increasing risk of overdose: See initial evaluation. No additional risks detected today Medical Psychology Evaluation: Please see scanned results in medical record.     Opioid Risk Tool - 09/28/17 0938      Family History of Substance Abuse   Alcohol Negative   Illegal Drugs Negative   Rx Drugs Negative     Personal History of Substance Abuse   Alcohol Positive Male or Male   Illegal Drugs Negative   Rx Drugs Negative     Age   Age between 15-45 years  No     History of Preadolescent Sexual Abuse   History of Preadolescent Sexual Abuse Negative or Male     Psychological Disease   Psychological Disease Negative   Depression Negative     Total Score   Opioid Risk Tool Scoring 3   Opioid Risk Interpretation Low Risk     ORT Scoring interpretation table:  Score <3 = Low Risk for SUD  Score between 4-7 =  Moderate Risk for SUD  Score >8 = High Risk for Opioid Abuse   Risk Mitigation Strategies:  Patient opioid safety counseling: Completed today. Counseling provided to patient as per "Patient Counseling Document". Document signed by patient, attesting to counseling and understanding Patient-Prescriber Agreement (PPA): Obtained today.  Controlled substance notification to other providers: Written and sent today.  Pharmacologic Plan: Today we may be taking over the patient's pharmacological regimen. See below             Laboratory Chemistry  Inflammation Markers (CRP: Acute Phase) (ESR: Chronic Phase) Lab Results  Component Value Date   CRP 0.4 08/31/2017   ESRSEDRATE 3 08/31/2017                 Renal Function Markers Lab Results  Component Value Date   BUN 17 08/31/2017   CREATININE 1.03 08/31/2017   GFRAA 80 08/31/2017   GFRNONAA 69 08/31/2017                 Hepatic Function Markers Lab Results  Component Value Date   AST 19 08/31/2017   ALT 13 (L) 05/28/2016   ALBUMIN 4.6 08/31/2017   ALKPHOS 60 08/31/2017                 Electrolytes Lab Results  Component Value Date   NA 141 08/31/2017   K 3.5 08/31/2017   CL 101 08/31/2017   CALCIUM 9.8 08/31/2017   MG 1.8 08/31/2017                 Neuropathy Markers Lab Results  Component Value Date   VITAMINB12 416 08/31/2017                 Bone Pathology Markers Lab Results  Component Value Date   ALKPHOS 60 08/31/2017   25OHVITD1 31 08/31/2017   25OHVITD2 <1.0 08/31/2017   25OHVITD3 31 08/31/2017   CALCIUM 9.8 08/31/2017                 Coagulation Parameters Lab Results  Component Value Date   INR 1.14 05/28/2016   LABPROT 14.8 05/28/2016   APTT 37 05/28/2016   PLT 78 (L) 06/03/2016                 Cardiovascular Markers Lab Results  Component Value Date   HGB 11.1 (L) 06/03/2016   HCT 35.1 (L) 06/03/2016                 Note: Lab results reviewed.  Recent Diagnostic Imaging Review   Lumbosacral Imaging: Lumbar DG F/E views:  Results for orders placed during the hospital encounter of 08/31/17  DG Lumb Spine Flex&Ext Only   Narrative CLINICAL DATA:  Progressive lumbago with radicular symptoms  EXAM: LUMBAR SPINE FLEX AND EXTEND ONLY - 2-3 VIEW  COMPARISON:  None.  FINDINGS: Neutral lateral, flexion lateral, and extension lateral lumbar images were obtained. Patient's ability to flex and extend is limited. No evident fracture or spondylolisthesis. No appreciable change in lateral alignment with flexion-extension. There is moderate disc space narrowing at L4-5. There is slightly milder disc space narrowing at L1-2 and L3-4. No erosive change. There is aortoiliac atherosclerosis.  IMPRESSION: Osteoarthritic changes several levels. No appreciable fracture or spondylolisthesis. No appreciable change in lateral alignment with flexion-extension. Patient's ability to flex and extend appears limited. There is aortoiliac atherosclerosis.  Aortic Atherosclerosis (ICD10-I70.0).   Electronically Signed   By: Lowella Grip III M.D.   On: 09/01/2017 08:57    Sacroiliac Joint Imaging: Sacroiliac Joint DG:  Results for orders placed during the hospital encounter of 08/31/17  DG Si Joints   Narrative CLINICAL DATA:  Sacroiliac pain  EXAM: BILATERAL SACROILIAC JOINTS - 3+ VIEW  COMPARISON:  CT abdomen pelvis of 05/07/2016  FINDINGS: The SI joints appear well corticated. There is no evidence of sacroiliitis. The bones are perhaps slightly osteopenic. Mild degenerative changes noted in the hips. Degenerative change also is noted in the lower lumbar spine.  IMPRESSION: The SI  joints appear normally corticated. Mild degenerative change of the lower lumbar spine and in the hips.   Electronically Signed   By: Ivar Drape M.D.   On: 09/01/2017 08:57    Hip Imaging: Hip-R DG 2-3 views:  Results for orders placed during the hospital encounter of 06/02/17   DG HIP UNILAT WITH PELVIS 2-3 VIEWS RIGHT   Narrative CLINICAL DATA:  Right hip pain for 2 months.  No injury.  EXAM: DG HIP (WITH OR WITHOUT PELVIS) 2-3V RIGHT  COMPARISON:  None.  FINDINGS: There is no evidence of hip fracture or dislocation. There is narrow bilateral hip joint spaces. Degenerative joint changes of the lower lumbar spine. Vascular stent are identified.  IMPRESSION: No acute fracture or dislocation.  Degenerative joint changes of bilateral hips.   Electronically Signed   By: Abelardo Diesel M.D.   On: 06/02/2017 14:21    Complexity Note: Imaging results reviewed. Results shared with Mr. Jacquin, using Layman's terms.                         Meds   Current Outpatient Prescriptions:  .  acetaminophen (TYLENOL) 325 MG tablet, Take 650 mg by mouth daily as needed for mild pain or headache. , Disp: , Rfl:  .  aspirin EC 81 MG tablet, Take 1 tablet (81 mg total) by mouth daily., Disp: 90 tablet, Rfl: 3 .  atorvastatin (LIPITOR) 80 MG tablet, Take 1 tablet (80 mg total) by mouth daily., Disp: 90 tablet, Rfl: 3 .  clopidogrel (PLAVIX) 75 MG tablet, Take 1 tablet (75 mg total) by mouth daily with breakfast., Disp: 90 tablet, Rfl: 3 .  hydrochlorothiazide (HYDRODIURIL) 25 MG tablet, Take 25 mg by mouth daily., Disp: , Rfl:  .  isosorbide dinitrate (ISORDIL) 10 MG tablet, Take 20 mg by mouth every morning. Take 10 mg by mouth in the afternoon, Disp: , Rfl:  .  lisinopril (PRINIVIL,ZESTRIL) 20 MG tablet, Take 1 tablet (20 mg total) by mouth daily., Disp: 90 tablet, Rfl: 3 .  metoprolol tartrate (LOPRESSOR) 25 MG tablet, Take 0.5 tablets (12.5 mg total) by mouth at bedtime., Disp: 45 tablet, Rfl: 3 .  Multiple Vitamins-Minerals (PRESERVISION AREDS PO), Take 1 tablet by mouth 2 (two) times daily., Disp: , Rfl:  .  nitroGLYCERIN (NITROSTAT) 0.4 MG SL tablet, Place 0.4 mg under the tongue every 5 (five) minutes as needed for chest pain., Disp: , Rfl:  .  pantoprazole (PROTONIX)  40 MG tablet, Take 20 mg by mouth daily., Disp: , Rfl:  .  tamsulosin (FLOMAX) 0.4 MG CAPS capsule, Take 1 capsule (0.4 mg total) by mouth daily., Disp: 30 capsule, Rfl: 3 .  traMADol (ULTRAM) 50 MG tablet, Take 1 tablet (50 mg total) by mouth every 8 (eight) hours as needed., Disp: 90 tablet, Rfl: 0 .  gabapentin (NEURONTIN) 100 MG capsule, Take 1-3 capsules (100-300 mg total) by mouth 3 (three) times daily. Follow written titration schedule., Disp: 90 capsule, Rfl: 0  ROS  Constitutional: Denies any fever or chills Gastrointestinal: No reported hemesis, hematochezia, vomiting, or acute GI distress Musculoskeletal: Denies any acute onset joint swelling, redness, loss of ROM, or weakness Neurological: No reported episodes of acute onset apraxia, aphasia, dysarthria, agnosia, amnesia, paralysis, loss of coordination, or loss of consciousness  Allergies  Mr. Vanscyoc has No Known Allergies.  PFSH  Drug: Mr. Rill  reports that he does not use drugs. Alcohol:  reports that he does not drink  alcohol. Tobacco:  reports that he quit smoking about 27 years ago. His smoking use included Cigarettes. He has a 50.00 pack-year smoking history. He has never used smokeless tobacco. Medical:  has a past medical history of AS (aortic stenosis); CAD (coronary artery disease); Carotid bruit; GERD (gastroesophageal reflux disease); History of kidney stones; HOH (hard of hearing); HTN (hypertension); Hyperlipidemia; Myocardial infarction ([place]) (1996); Nephrolithiasis; OSA (obstructive sleep apnea); PAD (peripheral artery disease) ([place]) (02/2014); Shortness of breath; and Thrombocytopenia ([place]). Surgical: Mr. Caporale  has a past surgical history that includes Stomach surgery; Coronary artery bypass graft (1991); Inguinal hernia repair (11/10/2011); left heart catheterization with coronary/graft angiogram (N/A, 03/06/2014); abdominal aortagram (N/A, 03/06/2014); Cardiac catheterization (02/2014); Cardiac catheterization (N/A,  07/31/2015); Cardiac catheterization (N/A, 07/31/2015); Cardiac catheterization (N/A, 05/26/2016); Cysto; Transcatheter aortic valve replacement, transfemoral (N/A, 06/01/2016); and TEE without cardioversion (N/A, 06/01/2016). Family: family history includes Heart disease in his father and mother.  Constitutional Exam  General appearance: Well nourished, well developed, and well hydrated. In no apparent acute distress Vitals:   09/28/17 0929  BP: 127/62  Pulse: (!) 59  Resp: 16  Temp: 98 F (36.7 C)  SpO2: 97%  Weight: 190 lb (86.2 kg)  Height: 5' 9" (1.753 m)   BMI Assessment: Estimated body mass index is 28.06 kg/m as calculated from the following:   Height as of this encounter: 5' 9" (1.753 m).   Weight as of this encounter: 190 lb (86.2 kg).  BMI interpretation table: BMI level Category Range association with higher incidence of chronic pain  <18 kg/m2 Underweight   18.5-24.9 kg/m2 Ideal body weight   25-29.9 kg/m2 Overweight Increased incidence by 20%  30-34.9 kg/m2 Obese (Class I) Increased incidence by 68%  35-39.9 kg/m2 Severe obesity (Class II) Increased incidence by 136%  >40 kg/m2 Extreme obesity (Class III) Increased incidence by 254%   BMI Readings from Last 4 Encounters:  09/28/17 28.06 kg/m  08/31/17 28.06 kg/m  05/23/17 29.10 kg/m  05/13/17 28.24 kg/m   Wt Readings from Last 4 Encounters:  09/28/17 190 lb (86.2 kg)  08/31/17 190 lb (86.2 kg)  05/23/17 191 lb 6.4 oz (86.8 kg)  05/13/17 191 lb 4 oz (86.8 kg)  Psych/Mental status: Alert, oriented x 3 (person, place, & time)       Eyes: PERLA Respiratory: No evidence of acute respiratory distress  Cervical Spine Area Exam  Skin & Axial Inspection: No masses, redness, edema, swelling, or associated skin lesions Alignment: Symmetrical Functional ROM: Unrestricted ROM      Stability: No instability detected Muscle Tone/Strength: Functionally intact. No obvious neuro-muscular anomalies detected. Sensory  (Neurological): Unimpaired Palpation: No palpable anomalies              Upper Extremity (UE) Exam    Side: Right upper extremity  Side: Left upper extremity  Skin & Extremity Inspection: Skin color, temperature, and hair growth are WNL. No peripheral edema or cyanosis. No masses, redness, swelling, asymmetry, or associated skin lesions. No contractures.  Skin & Extremity Inspection: Skin color, temperature, and hair growth are WNL. No peripheral edema or cyanosis. No masses, redness, swelling, asymmetry, or associated skin lesions. No contractures.  Functional ROM: Unrestricted ROM          Functional ROM: Unrestricted ROM          Muscle Tone/Strength: Functionally intact. No obvious neuro-muscular anomalies detected.  Muscle Tone/Strength: Functionally intact. No obvious neuro-muscular anomalies detected.  Sensory (Neurological): Unimpaired  Sensory (Neurological): Unimpaired          Palpation: No palpable anomalies              Palpation: No palpable anomalies              Specialized Test(s): Deferred         Specialized Test(s): Deferred          Thoracic Spine Area Exam  Skin & Axial Inspection: No masses, redness, or swelling Alignment: Symmetrical Functional ROM: Unrestricted ROM Stability: No instability detected Muscle Tone/Strength: Functionally intact. No obvious neuro-muscular anomalies detected. Sensory (Neurological): Unimpaired Muscle strength & Tone: No palpable anomalies  Lumbar Spine Area Exam  Skin & Axial Inspection: No masses, redness, or swelling Alignment: Symmetrical Functional ROM: Unrestricted ROM      Stability: No instability detected Muscle Tone/Strength: Functionally intact. No obvious neuro-muscular anomalies detected. Sensory (Neurological): Unimpaired Palpation: No palpable anomalies       Provocative Tests: Lumbar Hyperextension and rotation test: evaluation deferred today       Lumbar Lateral bending test: evaluation deferred today        Patrick's Maneuver: evaluation deferred today                    Gait & Posture Assessment  Ambulation: Unassisted Gait: Relatively normal for age and body habitus Posture: WNL   Lower Extremity Exam    Side: Right lower extremity  Side: Left lower extremity  Skin & Extremity Inspection: Skin color, temperature, and hair growth are WNL. No peripheral edema or cyanosis. No masses, redness, swelling, asymmetry, or associated skin lesions. No contractures.  Skin & Extremity Inspection: Skin color, temperature, and hair growth are WNL. No peripheral edema or cyanosis. No masses, redness, swelling, asymmetry, or associated skin lesions. No contractures.  Functional ROM: Unrestricted ROM          Functional ROM: Unrestricted ROM          Muscle Tone/Strength: Functionally intact. No obvious neuro-muscular anomalies detected.  Muscle Tone/Strength: Functionally intact. No obvious neuro-muscular anomalies detected.  Sensory (Neurological): Unimpaired  Sensory (Neurological): Unimpaired  Palpation: No palpable anomalies  Palpation: No palpable anomalies   Assessment & Plan  Primary Diagnosis & Pertinent Problem List: The primary encounter diagnosis was Chronic low back pain (Primary Area of Pain) (Bilateral) (L>R). Diagnoses of DDD (degenerative disc disease), lumbar, Lumbar spondylosis, Lumbar facet osteoarthritis, Lumbar facet syndrome (Bilateral) (L>R), Chronic sacroiliac joint pain (Bilateral) (L>R), Chronic pain syndrome, Neurogenic pain, Obstructive sleep apnea, History of alcoholism ([place]), Long term current use of anticoagulant (Plavix), History of MI (myocardial infarction) (Last 2004), Aortic valve Replacement, and Gastroesophageal reflux disease without esophagitis were also pertinent to this visit.  Visit Diagnosis: 1. Chronic low back pain (Primary Area of Pain) (Bilateral) (L>R)   2. DDD (degenerative disc disease), lumbar   3. Lumbar spondylosis   4. Lumbar facet osteoarthritis   5.  Lumbar facet syndrome (Bilateral) (L>R)   6. Chronic sacroiliac joint pain (Bilateral) (L>R)   7. Chronic pain syndrome   8. Neurogenic pain   9. Obstructive sleep apnea   10. History of alcoholism ([place])   11. Long term current use of anticoagulant (Plavix)   12. History of MI (myocardial infarction) (Last 2004)   13. Aortic valve Replacement   14. Gastroesophageal reflux disease without esophagitis    Problems updated and reviewed during this visit: Problem  Neurogenic Pain  Lumbar facet syndrome (Bilateral) (L>R)  Ddd (Degenerative Disc Disease), Lumbar  Lumbar facet osteoarthritis  Lumbar spondylosis  Chronic low back pain (Primary Area of Pain) (Bilateral) (L>R)  Chronic sacroiliac joint pain (Bilateral) (L>R)  Long term current use of anticoagulant (Plavix)   Ardia, MD (Cardiology)   History of Alcoholism ([facility])  History of MI (myocardial infarction) (Last 2004)  Gastroesophageal Reflux Disease Without Esophagitis  Aortic valve Replacement   Qualifier: Diagnosis of  By: Aundra Dubin, MD, [place]      Time Note: Greater than 50% of the 40 minute(s) of face-to-face time spent with Mr. Hufford, was spent in counseling/coordination of care regarding: the appropriate use of the pain scale, Mr. Boxx primary cause of pain, the results of his recent test(s), the significance of each one oth the test(s) anomalies and it's corresponding characteristic pain pattern(s), the treatment plan, treatment alternatives, the risks and possible complications of proposed treatment, medication side effects, realistic expectations, the goals of pain management (increased in functionality), the medication agreement and the need to collect and read the AVS material.  Plan of Care  Pharmacotherapy (Medications Ordered): Meds ordered this encounter  Medications  . traMADol (ULTRAM) 50 MG tablet    Sig: Take 1 tablet (50 mg total) by mouth every 8 (eight) hours as needed.    Dispense:  90 tablet    Refill:   0    Fill one day early if pharmacy is closed on scheduled refill date. Do not fill until: 09/28/17 To last until: 10/28/17  . gabapentin (NEURONTIN) 100 MG capsule    Sig: Take 1-3 capsules (100-300 mg total) by mouth 3 (three) times daily. Follow written titration schedule.    Dispense:  90 capsule    Refill:  0    Do not place medication on "Automatic Refill". Fill one day early if pharmacy is closed on scheduled refill date.    Procedure Orders     LUMBAR FACET(MEDIAL BRANCH NERVE BLOCK) MBNB     SACROILIAC JOINT INJECTINS     Split night study Lab Orders  No laboratory test(s) ordered today   Imaging Orders  No imaging studies ordered today    Referral Orders     Ambulatory referral to Sleep Studies  Pharmacological management options:  Opioid Analgesics: We'll take over management today. See above orders Membrane stabilizer: We have discussed the possibility of optimizing this mode of therapy, if tolerated Muscle relaxant: We have discussed the possibility of a trial NSAID: We have discussed the possibility of a trial Other analgesic(s): To be determined at a later time   Interventional management options: Planned, scheduled, and/or pending:    NOTE: Stop Plavix for procedures. Assessment as we get clearance to stop the Plavix, we will bring the patient back for a diagnostic bilateral lumbar facet block under fluoroscopic guidance and IV sedation    Considering:   Diagnostic bilateral Lumbar facet block  Possible bilateral lumbar facet RFA  Diagnostic bilateral SI joint injection  Possible bilateral SI joint RFA  Diagnostic bilateral hip injections    PRN Procedures:   None at this time   Provider-requested follow-up: Return in about 1 month (around 10/29/2017) for Med-Mgmt w/ Dr. Dossie Arbour.  Future Appointments Date Time Provider [place]  10/26/2017 1:30 PM Milinda Pointer, MD [facility] None    Primary Care Physician: Vesta Mixer Location: [facility] Note by: Gaspar Cola, MD Date: 09/28/2017; Time: 4:42 PM

## 2017-09-28 ENCOUNTER — Encounter: Payer: Self-pay | Admitting: Pain Medicine

## 2017-09-28 ENCOUNTER — Ambulatory Visit: Payer: Medicare Other | Attending: Pain Medicine | Admitting: Pain Medicine

## 2017-09-28 VITALS — BP 127/62 | HR 59 | Temp 98.0°F | Resp 16 | Ht 69.0 in | Wt 190.0 lb

## 2017-09-28 DIAGNOSIS — Z8249 Family history of ischemic heart disease and other diseases of the circulatory system: Secondary | ICD-10-CM | POA: Insufficient documentation

## 2017-09-28 DIAGNOSIS — M5442 Lumbago with sciatica, left side: Secondary | ICD-10-CM

## 2017-09-28 DIAGNOSIS — I10 Essential (primary) hypertension: Secondary | ICD-10-CM | POA: Diagnosis not present

## 2017-09-28 DIAGNOSIS — I359 Nonrheumatic aortic valve disorder, unspecified: Secondary | ICD-10-CM

## 2017-09-28 DIAGNOSIS — F1020 Alcohol dependence, uncomplicated: Secondary | ICD-10-CM | POA: Diagnosis not present

## 2017-09-28 DIAGNOSIS — Z7902 Long term (current) use of antithrombotics/antiplatelets: Secondary | ICD-10-CM | POA: Diagnosis not present

## 2017-09-28 DIAGNOSIS — I350 Nonrheumatic aortic (valve) stenosis: Secondary | ICD-10-CM | POA: Insufficient documentation

## 2017-09-28 DIAGNOSIS — M533 Sacrococcygeal disorders, not elsewhere classified: Secondary | ICD-10-CM | POA: Diagnosis not present

## 2017-09-28 DIAGNOSIS — D696 Thrombocytopenia, unspecified: Secondary | ICD-10-CM | POA: Insufficient documentation

## 2017-09-28 DIAGNOSIS — Z87442 Personal history of urinary calculi: Secondary | ICD-10-CM | POA: Insufficient documentation

## 2017-09-28 DIAGNOSIS — G8929 Other chronic pain: Secondary | ICD-10-CM

## 2017-09-28 DIAGNOSIS — M5136 Other intervertebral disc degeneration, lumbar region: Secondary | ICD-10-CM

## 2017-09-28 DIAGNOSIS — Z952 Presence of prosthetic heart valve: Secondary | ICD-10-CM | POA: Insufficient documentation

## 2017-09-28 DIAGNOSIS — G4733 Obstructive sleep apnea (adult) (pediatric): Secondary | ICD-10-CM | POA: Diagnosis not present

## 2017-09-28 DIAGNOSIS — G894 Chronic pain syndrome: Secondary | ICD-10-CM

## 2017-09-28 DIAGNOSIS — M47816 Spondylosis without myelopathy or radiculopathy, lumbar region: Secondary | ICD-10-CM

## 2017-09-28 DIAGNOSIS — E785 Hyperlipidemia, unspecified: Secondary | ICD-10-CM | POA: Insufficient documentation

## 2017-09-28 DIAGNOSIS — F1021 Alcohol dependence, in remission: Secondary | ICD-10-CM

## 2017-09-28 DIAGNOSIS — I2510 Atherosclerotic heart disease of native coronary artery without angina pectoris: Secondary | ICD-10-CM | POA: Diagnosis not present

## 2017-09-28 DIAGNOSIS — Z9889 Other specified postprocedural states: Secondary | ICD-10-CM | POA: Insufficient documentation

## 2017-09-28 DIAGNOSIS — Z7982 Long term (current) use of aspirin: Secondary | ICD-10-CM | POA: Diagnosis not present

## 2017-09-28 DIAGNOSIS — K219 Gastro-esophageal reflux disease without esophagitis: Secondary | ICD-10-CM

## 2017-09-28 DIAGNOSIS — Z87891 Personal history of nicotine dependence: Secondary | ICD-10-CM | POA: Diagnosis not present

## 2017-09-28 DIAGNOSIS — R200 Anesthesia of skin: Secondary | ICD-10-CM | POA: Diagnosis not present

## 2017-09-28 DIAGNOSIS — M792 Neuralgia and neuritis, unspecified: Secondary | ICD-10-CM | POA: Diagnosis not present

## 2017-09-28 DIAGNOSIS — Z79891 Long term (current) use of opiate analgesic: Secondary | ICD-10-CM | POA: Diagnosis not present

## 2017-09-28 DIAGNOSIS — I739 Peripheral vascular disease, unspecified: Secondary | ICD-10-CM | POA: Insufficient documentation

## 2017-09-28 DIAGNOSIS — Z79899 Other long term (current) drug therapy: Secondary | ICD-10-CM | POA: Insufficient documentation

## 2017-09-28 DIAGNOSIS — Z951 Presence of aortocoronary bypass graft: Secondary | ICD-10-CM | POA: Insufficient documentation

## 2017-09-28 DIAGNOSIS — Z7901 Long term (current) use of anticoagulants: Secondary | ICD-10-CM

## 2017-09-28 DIAGNOSIS — I252 Old myocardial infarction: Secondary | ICD-10-CM | POA: Diagnosis not present

## 2017-09-28 DIAGNOSIS — M4726 Other spondylosis with radiculopathy, lumbar region: Secondary | ICD-10-CM | POA: Insufficient documentation

## 2017-09-28 DIAGNOSIS — M5441 Lumbago with sciatica, right side: Secondary | ICD-10-CM

## 2017-09-28 MED ORDER — GABAPENTIN 100 MG PO CAPS: 100.0000 mg | ORAL_CAPSULE | Freq: Three times a day (TID) | ORAL | 0 refills | Status: DC

## 2017-09-28 MED ORDER — TRAMADOL HCL 50 MG PO TABS: 50.0000 mg | ORAL_TABLET | Freq: Three times a day (TID) | ORAL | 0 refills | Status: DC | PRN

## 2017-09-28 NOTE — Progress Notes (Signed)
Safety precautions to be maintained throughout the outpatient stay will include: orient to surroundings, keep bed in low position, maintain call bell within reach at all times, provide assistance with transfer out of bed and ambulation. FYI: has 2 tramadol pills in a pill bottle and  States "has 15-20" more pills at home,rx written by Dr.Feng Markus Jarvis  In [place], [place]

## 2017-09-28 NOTE — Patient Instructions (Addendum)
Take OTC Magnesium 500 mg by mouth at bedtime. Take OTC Vitamin B-12 supplements. Turmeric & Garlic food supplements [email or web address] ____________________________________________________________________________________________  Pain Scale  Introduction: The pain score used by this practice is the Verbal Numerical Rating Scale (VNRS-11). This is an 11-point scale. It is for adults and children 10 years or older. There are significant differences in how the pain score is reported, used, and applied. Forget everything you learned in the past and learn this scoring system.  General Information: The scale should reflect your current level of pain. Unless you are specifically asked for the level of your worst pain, or your average pain. If you are asked for one of these two, then it should be understood that it is over the past 24 hours.  Basic Activities of Daily Living (ADL): Personal hygiene, dressing, eating, transferring, and using restroom.  Instructions: Most patients tend to report their level of pain as a combination of two factors, their physical pain and their psychosocial pain. This last one is also known as "suffering" and it is reflection of how physical pain affects you socially and psychologically. From now on, report them separately. From this point on, when asked to report your pain level, report only your physical pain. Use the following table for reference.  Pain Clinic Pain Levels (0-5/10)  Pain Level Score  Description  No Pain 0   Mild pain 1 Nagging, annoying, but does not interfere with basic activities of daily living (ADL). Patients are able to eat, bathe, get dressed, toileting (being able to get on and off the toilet and perform personal hygiene functions), transfer (move in and out of bed or a chair without assistance), and maintain continence (able to control bladder and bowel functions). Blood pressure and heart rate are unaffected. A normal heart rate for  a healthy adult ranges from 60 to 100 bpm (beats per minute).   Mild to moderate pain 2 Noticeable and distracting. Impossible to hide from other people. More frequent flare-ups. Still possible to adapt and function close to normal. It can be very annoying and may have occasional stronger flare-ups. With discipline, patients may get used to it and adapt.   Moderate pain 3 Interferes significantly with activities of daily living (ADL). It becomes difficult to feed, bathe, get dressed, get on and off the toilet or to perform personal hygiene functions. Difficult to get in and out of bed or a chair without assistance. Very distracting. With effort, it can be ignored when deeply involved in activities.   Moderately severe pain 4 Impossible to ignore for more than a few minutes. With effort, patients may still be able to manage work or participate in some social activities. Very difficult to concentrate. Signs of autonomic nervous system discharge are evident: dilated pupils (mydriasis); mild sweating (diaphoresis); sleep interference. Heart rate becomes elevated (>115 bpm). Diastolic blood pressure (lower number) rises above 100 mmHg. Patients find relief in laying down and not moving.   Severe pain 5 Intense and extremely unpleasant. Associated with frowning face and frequent crying. Pain overwhelms the senses.  Ability to do any activity or maintain social relationships becomes significantly limited. Conversation becomes difficult. Pacing back and forth is common, as getting into a comfortable position is nearly impossible. Pain wakes you up from deep sleep. Physical signs will be obvious: pupillary dilation; increased sweating; goosebumps; brisk reflexes; cold, clammy hands and feet; nausea, vomiting or dry heaves; loss of appetite; significant sleep disturbance with inability to fall asleep or to remain  asleep. When persistent, significant weight loss is observed due to the complete loss of appetite and  sleep deprivation.  Blood pressure and heart rate becomes significantly elevated. Caution: If elevated blood pressure triggers a pounding headache associated with blurred vision, then the patient should immediately seek attention at an urgent or emergency care unit, as these may be signs of an impending stroke.    Emergency Department Pain Levels (6-10/10)  Emergency Room Pain 6 Severely limiting. Requires emergency care and should not be seen or managed at an outpatient pain management facility. Communication becomes difficult and requires great effort. Assistance to reach the emergency department may be required. Facial flushing and profuse sweating along with potentially dangerous increases in heart rate and blood pressure will be evident.   Distressing pain 7 Self-care is very difficult. Assistance is required to transport, or use restroom. Assistance to reach the emergency department will be required. Tasks requiring coordination, such as bathing and getting dressed become very difficult.   Disabling pain 8 Self-care is no longer possible. At this level, pain is disabling. The individual is unable to do even the most "basic" activities such as walking, eating, bathing, dressing, transferring to a bed, or toileting. Fine motor skills are lost. It is difficult to think clearly.   Incapacitating pain 9 Pain becomes incapacitating. Thought processing is no longer possible. Difficult to remember your own name. Control of movement and coordination are lost.   The worst pain imaginable 10 At this level, most patients pass out from pain. When this level is reached, collapse of the autonomic nervous system occurs, leading to a sudden drop in blood pressure and heart rate. This in turn results in a temporary and dramatic drop in blood flow to the brain, leading to a loss of consciousness. Fainting is one of the body's self defense mechanisms. Passing out puts the brain in a calmed state and causes it to shut  down for a while, in order to begin the healing process.    Summary: 1. Refer to this scale when providing [place] with your pain level. 2. Be accurate and careful when reporting your pain level. This will help with your care. 3. Over-reporting your pain level will lead to loss of credibility. 4. Even a level of 1/10 means that there is pain and will be treated at our facility. 5. High, inaccurate reporting will be documented as "Symptom Exaggeration", leading to loss of credibility and suspicions of possible secondary gains such as obtaining more narcotics, or wanting to appear disabled, for fraudulent reasons. 6. Only pain levels of 5 or below will be seen at our facility. 7. Pain levels of 6 and above will be sent to the Emergency Department and the appointment cancelled. ____________________________________________________________________________________________   ____________________________________________________________________________________________  Medication Rules  Applies to: All patients receiving prescriptions (written or electronic).  Pharmacy of record: Pharmacy where electronic prescriptions will be sent. If written prescriptions are taken to a different pharmacy, please inform the nursing staff. The pharmacy listed in the electronic medical record should be the one where you would like electronic prescriptions to be sent.  Prescription refills: Only during scheduled appointments. Applies to both, written and electronic prescriptions.  NOTE: The following applies primarily to controlled substances (Opioid* Pain Medications).   Patient's responsibilities: 1. Pain Pills: Bring all pain pills to every appointment (except for procedure appointments). 2. Pill Bottles: Bring pills in original pharmacy bottle. Always bring newest bottle. Bring bottle, even if empty. 3. Medication refills: You are responsible for  knowing and keeping track of what medications you need refilled. The  day before your appointment, write a list of all prescriptions that need to be refilled. Bring that list to your appointment and give it to the admitting nurse. Prescriptions will be written only during appointments. If you forget a medication, it will not be "Called in", "Faxed", or "electronically sent". You will need to get another appointment to get these prescribed. 4. Prescription Accuracy: You are responsible for carefully inspecting your prescriptions before leaving our office. Have the discharge nurse carefully go over each prescription with you, before taking them home. Make sure that your name is accurately spelled, that your address is correct. Check the name and dose of your medication to make sure it is accurate. Check the number of pills, and the written instructions to make sure they are clear and accurate. Make sure that you are given enough medication to last until your next medication refill appointment. 5. Taking Medication: Take medication as prescribed. Never take more pills than instructed. Never take medication more frequently than prescribed. Taking less pills or less frequently is permitted and encouraged, when it comes to controlled substances (written prescriptions).  6. Inform other Doctors: Always inform, all of your healthcare providers, of all the medications you take. 7. Pain Medication from other Providers: You are not allowed to accept any additional pain medication from any other Doctor or Healthcare provider. There are two exceptions to this rule. (see below) In the event that you require additional pain medication, you are responsible for notifying us, as stated below. 8. Medication Agreement: You are responsible for carefully reading and following our Medication Agreement. This must be signed before receiving any prescriptions from our practice. Safely store a copy of your signed Agreement. Violations to the Agreement will result in no further prescriptions. (Additional  copies of our Medication Agreement are available upon request.) 9. Laws, Rules, & Regulations: All patients are expected to follow all Federal and [employer], Rules, [employer]. Ignorance of the Laws does not constitute a valid excuse. The use of any illegal substances is prohibited. 10. Adopted CDC guidelines & recommendations: Target dosing levels will be at or below 60 MME/day. Use of benzodiazepines** is not recommended.  Exceptions: There are only two exceptions to the rule of not receiving pain medications from other Healthcare Providers. 1. Exception #1 (Emergencies): In the event of an emergency (i.e.: accident requiring emergency care), you are allowed to receive additional pain medication. However, you are responsible for: As soon as you are able, call our office [phone], at any time of the day or night, and leave a message stating your name, the date and nature of the emergency, and the name and dose of the medication prescribed. In the event that your call is answered by a member of our staff, make sure to document and save the date, time, and the name of the person that took your information.  2. Exception #2 (Planned Surgery): In the event that you are scheduled by another doctor or dentist to have any type of surgery or procedure, you are allowed (for a period no longer than 30 days), to receive additional pain medication, for the acute post-op pain. However, in this case, you are responsible for picking up a copy of our "Post-op Pain Management for Surgeons" handout, and giving it to your surgeon or dentist. This document is available at our office, and does not require an appointment to obtain it. Simply go to our  office during business hours (Monday-Thursday from 8:00 AM to 4:00 PM) (Friday 8:00 AM to 12:00 Noon) or if you have a scheduled appointment with [place], prior to your surgery, and ask for it by name. In addition, you will need to provide [place] with your name, name of  your surgeon, type of surgery, and date of procedure or surgery.  *Opioid medications include: morphine, codeine, oxycodone, oxymorphone, hydrocodone, hydromorphone, meperidine, tramadol, tapentadol, buprenorphine, fentanyl, methadone. **Benzodiazepine medications include: diazepam (Valium), alprazolam (Xanax), clonazepam (Klonopine), lorazepam (Ativan), clorazepate (Tranxene), chlordiazepoxide (Librium), estazolam (Prosom), oxazepam (Serax), temazepam (Restoril), triazolam (Halcion)  ____________________________________________________________________________________________ ____________________________________________________________________________________________  Initial Gabapentin Titration  Medication used: Gabapentin (Generic Name) or Neurontin (Brand Name) 100 mg tablets/capsules  Reasons to stop increasing the dose:  Reason 1: You get good relief of symptoms, in which case there is no need to increase the daily dose any further.    Reason 2: You develop some side effects, such as sleeping all of the time, difficulty concentrating, or becoming disoriented, in which case you need to go down on the dose, to the prior level, where you were not experiencing any side effects. Stay on that dose longer, to allow more time for your body to get use it, before attempting to increase it again.   Steps: Step 1: Start by taking 1 (one) tablet at bedtime x 7 (seven) days.  Step 2: After being on 1 (one) tablet for 7 (seven) days, then increase it to 2 (two) tablets at bedtime for another 7 (seven) days.  Step 3: Next, after being on 2 (two) tablets at bedtime for 7 (seven) days, then increase it to 3 (three) tablets at bedtime, and stay on that dose until you see your doctor.  Reasons to stop increasing the dose: Reason 1: You get good relief of symptoms, in which case there is no need to increase the daily dose any further.  Reason 2: You develop some side effects, such as sleeping all of the  time, difficulty concentrating, or becoming disoriented, in which case you need to go down on the dose, to the prior level, where you were not experiencing any side effects. Stay on that dose longer, to allow more time for your body to get use it, before attempting to increase it again.  Endpoint: Once you have reached the maximum dose you can tolerate without side-effects, contact your physician so as to evaluate the results of the regimen.   Questions: Feel free to contact us for any questions or problems at [phone] ____________________________________________________________________________________________  ____________________________________________________________________________________________  Pain Prevention Technique  Definition:   A technique used to minimize the effects of an activity known to cause inflammation or swelling, which in turn leads to an increase in pain.  Purpose: To prevent swelling from occurring. It is based on the fact that it is easier to prevent swelling from happening than it is to get rid of it, once it occurs.  Contraindications: 1. Anyone with allergy or hypersensitivity to the recommended medications. 2. Anyone taking anticoagulants (Blood Thinners) (e.g., Coumadin, Warfarin, Plavix, etc.). 3. Patients in Renal Failure.  Technique: Before you undertake an activity known to cause pain, or a flare-up of your chronic pain, and before you experience any pain, do the following:  1. On a full stomach, take 4 (four) over the counter Ibuprofens 200mg  tablets (Motrin), for a total of 800 mg. 2. In addition, take over the counter Magnesium 400 to 500 mg, before doing the activity.  3. Six (6) hours  later, again on a full stomach, repeat the Ibuprofen. 4. That night, take a warm shower and stretch under the running warm water.  This technique may be sufficient to abort the pain and discomfort before it happens. Keep in mind that it takes a lot less  medication to prevent swelling than it takes to eliminate it once it occurs.  ____________________________________________________________________________________________  Pain Management Discharge Instructions  General Discharge Instructions :  If you need to reach your doctor call: Monday-Friday 8:00 am - 4:00 pm at [phone] or toll free [phone].  After clinic hours [phone] to have operator reach doctor.  Bring all of your medication bottles to all your appointments in the pain clinic.  To cancel or reschedule your appointment with Pain Management please remember to call 24 hours in advance to avoid a fee.  Refer to the educational materials which you have been given on: General Risks, I had my Procedure. Discharge Instructions, Post Sedation.  Post Procedure Instructions:  The drugs you were given will stay in your system until tomorrow, so for the next 24 hours you should not drive, make any legal decisions or drink any alcoholic beverages.  You may eat anything you prefer, but it is better to start with liquids then soups and crackers, and gradually work up to solid foods.  Please notify your doctor immediately if you have any unusual bleeding, trouble breathing or pain that is not related to your normal pain.  Depending on the type of procedure that was done, some parts of your body may feel week and/or numb.  This usually clears up by tonight or the next day.  Walk with the use of an assistive device or accompanied by an adult for the 24 hours.  You may use ice on the affected area for the first 24 hours.  Put ice in a Ziploc bag and cover with a towel and place against area 15 minutes on 15 minutes off.  You may switch to heat after 24 hours.Sacroiliac (SI) Joint Injection Patient Information  Description: The sacroiliac joint connects the scrum (very low back and tailbone) to the ilium (a pelvic bone which also forms half of the hip joint).  Normally this joint  experiences very little motion.  When this joint becomes inflamed or unstable low back and or hip and pelvis pain may result.  Injection of this joint with local anesthetics (numbing medicines) and steroids can provide diagnostic information and reduce pain.  This injection is performed with the aid of x-ray guidance into the tailbone area while you are lying on your stomach.   You may experience an electrical sensation down the leg while this is being done.  You may also experience numbness.  We also may ask if we are reproducing your normal pain during the injection.  Conditions which may be treated SI injection:   Low back, buttock, hip or leg pain  Preparation for the Injection:  3. Do not eat any solid food or dairy products within 8 hours of your appointment.  4. You may drink clear liquids up to 3 hours before appointment.  Clear liquids include water, black coffee, juice or soda.  No milk or cream please. 5. You may take your regular medications, including pain medications with a sip of water before your appointment.  Diabetics should hold regular insulin (if take separately) and take 1/2 normal NPH dose the morning of the procedure.  Carry some sugar containing items with you to your appointment. 6. A driver  must accompany you and be prepared to drive you home after your procedure. 7. Bring all of your current medications with you. 8. An IV may be inserted and sedation may be given at the discretion of the physician. 9. A blood pressure cuff, EKG and other monitors will often be applied during the procedure.  Some patients may need to have extra oxygen administered for a short period.  10. You will be asked to provide medical information, including your allergies, prior to the procedure.  We must know immediately if you are taking blood thinners (like Coumadin/Warfarin) or if you are allergic to IV iodine contrast (dye).  We must know if you could possible be pregnant.  Possible side  effects:   Bleeding from needle site  Infection (rare, may require surgery)  Nerve injury (rare)  Numbness & tingling (temporary)  A brief convulsion or seizure  Light-headedness (temporary)  Pain at injection site (several days)  Decreased blood pressure (temporary)  Weakness in the leg (temporary)   Call if you experience:   New onset weakness or numbness of an extremity below the injection site that last more than 8 hours.  Hives or difficulty breathing ( go to the emergency room)  Inflammation or drainage at the injection site  Any new symptoms which are concerning to you  Please note:  Although the local anesthetic injected can often make your back/ hip/ buttock/ leg feel good for several hours after the injections, the pain will likely return.  It takes 3-7 days for steroids to work in the sacroiliac area.  You may not notice any pain relief for at least that one week.  If effective, we will often do a series of three injections spaced 3-6 weeks apart to maximally decrease your pain.  After the initial series, we generally will wait some months before a repeat injection of the same type.  If you have any questions, please call [phone] [place] After Refer to this sheet in the next few weeks. These instructions provide you with information about caring for yourself after your procedure. Your health care provider may also give you more specific instructions. Your treatment has been planned according to current medical practices, but problems sometimes occur. Call your health care provider if you have any problems or questions after your procedure. What can I expect after the procedure? After the procedure, it is common to have:  Some tenderness over the injection sites for 2 days after the procedure.  A temporary increase in blood sugar if you have diabetes.  Follow these instructions at  home:  Keep track of the amount of pain relief you feel and how long it lasts.  Take over-the-counter and prescription medicines only as told by your health care provider. You may need to limit pain medicine within the first 4-6 hours after the procedure.  Remove your bandages (dressings) the morning after the procedure.  For the first 24 hours after the procedure: ? Do not apply heat near or over the injection sites. ? Do not take a bath or soak in water, such as in a pool or lake. ? Do not drive or operate heavy machinery unless approved by your health care provider. ? Avoid activities that require a lot of energy.  If the injection site is tender, try applying ice to the area. To do this: ? Put ice in a plastic bag. ? Place a towel between your skin  and the bag. ? Leave the ice on for 20 minutes, 2-3 times a day.  Keep all follow-up visits as told by your health care provider. This is important. Contact a health care provider if:  Fluid is coming from an injection site.  There is significant bleeding or swelling at an injection site.  You have diabetes and your blood sugar is above 180 mg/dL. Get help right away if:  You have a fever.  You have worsening pain or swelling around an injection site.  There are red streaks around an injection site.  You develop severe pain that is not controlled by your medicines.  You develop a headache, stiff neck, nausea, or vomiting.  Your eyes become very sensitive to light.  You have weakness, paralysis, or tingling in your arms or legs that was not present before the procedure.  You have difficulty urinating or breathing. This information is not intended to replace advice given to you by your health care provider. Make sure you discuss any questions you have with your health care provider. Document Released: 11/15/2012 Document Revised: 04/14/2016 Document Reviewed: 08/25/2015 Elsevier Interactive Patient Education  2018 [employer].  Facet Joint Block The facet joints connect the bones of the spine (vertebrae). They make it possible for you to bend, twist, and make other movements with your spine. They also keep you from bending too far, twisting too far, and making other excessive movements. A facet joint block is a procedure where a numbing medicine (anesthetic) is injected into a facet joint. Often, a type of anti-inflammatory medicine called a steroid is also injected. A facet joint block may be done to diagnose neck or back pain. If the pain gets better after a facet joint block, it means the pain is probably coming from the facet joint. If the pain does not get better, it means the pain is probably not coming from the facet joint. A facet joint block may also be done to relieve neck or back pain caused by an inflamed facet joint. A facet joint block is only done to relieve pain if the pain does not improve with other methods, such as medicine, exercise programs, and physical therapy. Tell a health care provider about:  Any allergies you have.  All medicines you are taking, including vitamins, herbs, eye drops, creams, and over-the-counter medicines.  Any problems you or family members have had with anesthetic medicines.  Any blood disorders you have.  Any surgeries you have had.  Any medical conditions you have.  Whether you are pregnant or may be pregnant. What are the risks? Generally, this is a safe procedure. However, problems may occur, including:  Bleeding.  Injury to a nerve near the injection site.  Pain at the injection site.  Weakness or numbness in areas controlled by nerves near the injection site.  Infection.  Temporary fluid retention.  Allergic reactions to medicines or dyes.  Injury to other structures or organs near the injection site.  What happens before the procedure?  Follow instructions from your health care provider about eating or drinking restrictions.  Ask your  health care provider about: ? Changing or stopping your regular medicines. This is especially important if you are taking diabetes medicines or blood thinners. ? Taking medicines such as aspirin and ibuprofen. These medicines can thin your blood. Do not take these medicines before your procedure if your health care provider instructs you not to.  Do not take any new dietary supplements or medicines without asking  your health care provider first.  Plan to have someone take you home after the procedure. What happens during the procedure?  You may need to remove your clothing and dress in an open-back gown.  The procedure will be done while you are lying on an X-ray table. You will most likely be asked to lie on your stomach, but you may be asked to lie in a different position if an injection will be made in your neck.  Machines will be used to monitor your oxygen levels, heart rate, and blood pressure.  If an injection will be made in your neck, an IV tube will be inserted into one of your veins. Fluids and medicine will flow directly into your body through the IV tube.  The area over the facet joint where the injection will be made will be cleaned with soap. The surrounding skin will be covered with clean drapes.  A numbing medicine (local anesthetic) will be applied to your skin. Your skin may sting or burn for a moment.  A video X-ray machine (fluoroscopy) will be used to locate the joint. In some cases, a CT scan may be used.  A contrast dye may be injected into the facet joint area to help locate the joint.  When the joint is located, an anesthetic will be injected into the joint through the needle.  Your health care provider will ask you whether you feel pain relief. If you do feel relief, a steroid may be injected to provide pain relief for a longer period of time. If you do not feel relief or feel only partial relief, additional injections of an anesthetic may be made in other facet  joints.  The needle will be removed.  Your skin will be cleaned.  A bandage (dressing) will be applied over each injection site. The procedure may vary among health care providers and hospitals. What happens after the procedure?  You will be observed for 15-30 minutes before being allowed to go home. This information is not intended to replace advice given to you by your health care provider. Make sure you discuss any questions you have with your health care provider. Document Released: 04/20/2007 Document Revised: 12/31/2015 Document Reviewed: 08/25/2015 [employer]vier Interactive Patient Education  [employer].

## 2017-09-29 ENCOUNTER — Telehealth: Payer: Self-pay | Admitting: Cardiovascular Disease

## 2017-09-29 NOTE — Telephone Encounter (Signed)
Received request for pt to stop plavix 7 days before diagnostic bilateral lumbar facet block with Dr. Dossie Arbour.  Per his notes: "Interventional management options: Planned, scheduled, and/or pending:    NOTE: Stop Plavix for procedures. Assessment as we get clearance to stop the Plavix, we will bring the patient back for a diagnostic bilateral lumbar facet block under fluoroscopic guidance and IV sedation    Request in MD basket

## 2017-10-03 NOTE — Telephone Encounter (Signed)
Faxed clearance with approval to stop plavix 7 days before procedure but continue 81mg  aspirin to [phone]

## 2017-10-09 ENCOUNTER — Other Ambulatory Visit: Payer: Self-pay | Admitting: Cardiovascular Disease

## 2017-10-14 ENCOUNTER — Telehealth: Payer: Self-pay | Admitting: *Deleted

## 2017-10-14 NOTE — Telephone Encounter (Signed)
Attempted to reach patient re; clearance from Dr Fletcher Anon for Dx bilateral lumbar facet.  He is to stop plavix x 7 days prior to procedure and maintain his ASA 81 mg daily. Patient does have an appt scheduled for f/up on 10/26/17 however, I do not see an appt for procedure.

## 2017-10-25 NOTE — Progress Notes (Signed)
Patient's Name: Matthew [name]  MRN: [id number]  Referring Provider: Vesta Mixer  DOB: 10-15-1936  PCP: Vesta Mixer  DOS: 10/26/2017  Note by: Gaspar Cola, MD  Service setting: Ambulatory outpatient  Specialty: Interventional Pain Management  Location: [facility] (AMB) Pain Management Facility    Patient type: Established   Primary Reason(s) for Visit: Encounter for prescription drug management. (Level of risk: moderate)  CC: Back Pain (lower)  HPI  Matthew [name] is a 81 y.o. year old, male patient, who comes today for a medication management evaluation. He has HLD (hyperlipidemia); Obstructive sleep apnea; Essential hypertension; Coronary atherosclerosis; Aortic valve Replacement; Angina pectoris ([place]); PAD (peripheral artery disease) ([place]); Effort angina ([place]); Coronary artery disease; Thrombocytopenia ([place]); Severe aortic stenosis; Long term current use of opiate analgesic; Long term prescription opiate use; Opiate use; Chronic low back pain (Primary Area of Pain) (Bilateral) (L>R); Chronic pain of lower extremity; Chronic pain syndrome; Chronic sacroiliac joint pain (Bilateral) (L>R); Chronic hip pain; Pain; Long term current use of anticoagulant (Plavix); History of MI (myocardial infarction) (Last 2004); History of alcoholism ([place]); Neurogenic pain; Lumbar facet syndrome (Bilateral) (L>R); DDD (degenerative disc disease), lumbar; Lumbar facet osteoarthritis; Lumbar spondylosis; and Gastroesophageal reflux disease without esophagitis on their problem list. His primarily concern today is the Back Pain (lower)  Pain Assessment: Location: Left, Right Back Radiating: down both legs to the feet, with numbness in n bottom of left foot Onset: More than a month ago Duration: Chronic pain Quality: Aching, Sharp Severity: 5 /10 (self-reported pain score)  Note: Reported level is compatible with observation.                         When using our objective Pain Scale, levels between 6  and 10/10 are said to belong in an emergency room, as it progressively worsens from a 6/10, described as severely limiting, requiring emergency care not usually available at an outpatient pain management facility. At a 6/10 level, communication becomes difficult and requires great effort. Assistance to reach the emergency department may be required. Facial flushing and profuse sweating along with potentially dangerous increases in heart rate and blood pressure will be evident. Effect on ADL:   Timing:   Modifying factors: medications  Matthew [name] was last scheduled for an appointment on 09/28/2017 for medication management. During today's appointment we reviewed Matthew [name] chronic pain status, as well as his outpatient medication regimen.  The patient  reports that he does not use drugs. His body mass index is 27.32 kg/m.  Further details on both, my assessment(s), as well as the proposed treatment plan, please see below.  Controlled Substance Pharmacotherapy Assessment REMS (Risk Evaluation and Mitigation Strategy)  Analgesic: Tramadol 50 mg 1 tablet by mouth every 8 hours (150 mg/day of tramadol) (15 MME/day) MME/day: 15 mg/day.  Landis Martins, RN  10/26/2017  1:42 PM  Sign at close encounter Nursing Pain Medication Assessment:  Safety precautions to be maintained throughout the outpatient stay will include: orient to surroundings, keep bed in low position, maintain call bell within reach at all times, provide assistance with transfer out of bed and ambulation.  Medication Inspection Compliance: Pill count conducted under aseptic conditions, in front of the patient. Neither the pills nor the bottle was removed from the patient's sight at any time. Once count was completed pills were immediately returned to the patient in their original bottle.  Medication: See above Pill/Patch Count: 68 of 90  pills remain Pill/Patch Appearance: Markings consistent with prescribed medication Bottle  Appearance: Standard pharmacy container. Clearly labeled. Filled Date: 10/20 / 2018 Last Medication intake:  Today  Landis Martins, RN  10/26/2017  1:40 PM  Sign at close encounter S   Pharmacokinetics: Liberation and absorption (onset of action): WNL Distribution (time to peak effect): WNL Metabolism and excretion (duration of action): WNL         Pharmacodynamics: Desired effects: Analgesia: Matthew [name] reports >50% benefit. Functional ability: Patient reports that medication allows him to accomplish basic ADLs Clinically meaningful improvement in function (CMIF): Sustained CMIF goals met Perceived effectiveness: Described as relatively effective, allowing for increase in activities of daily living (ADL) Undesirable effects: Side-effects or Adverse reactions: None reported Monitoring: [place] PMP: Online review of the past 60-monthperiod conducted. Compliant with practice rules and regulations Last UDS on record: Summary  Date Value Ref Range Status  08/31/2017 FINAL  Final    Comment:    ==================================================================== TOXASSURE COMP DRUG ANALYSIS,UR ==================================================================== Test                             Result       Flag       Units Drug Present and Declared for Prescription Verification   Acetaminophen                  PRESENT      EXPECTED Drug Present not Declared for Prescription Verification   Tramadol                       >5102        UNEXPECTED ng/mg creat   O-Desmethyltramadol            5066         UNEXPECTED ng/mg creat   N-Desmethyltramadol            1382         UNEXPECTED ng/mg creat    Source of tramadol is a prescription medication.    O-desmethyltramadol and N-desmethyltramadol are expected    metabolites of tramadol. Drug Absent but Declared for Prescription Verification   Salicylate                     Not Detected UNEXPECTED    Aspirin, as indicated in the declared  medication list, is not    always detected even when used as directed.   Metoprolol                     Not Detected UNEXPECTED ==================================================================== Test                      Result    Flag   Units      Ref Range   Creatinine              98               mg/dL      >=20 ==================================================================== Declared Medications:  The flagging and interpretation on this report are based on the  following declared medications.  Unexpected results may arise from  inaccuracies in the declared medications.  **Note: The testing scope of this panel includes these medications:  Metoprolol  **Note: The testing scope of this panel does not include small to  moderate amounts of these reported medications:  Acetaminophen  Aspirin  **Note:  The testing scope of this panel does not include following  reported medications:  Atorvastatin  Clopidogrel  Hydrochlorothiazide (HCTZ)  Isosorbide Mononitrate  Lisinopril  Multivitamin  Nitroglycerin  Pantoprazole  Tamsulosin ==================================================================== For clinical consultation, please call [phone]. ====================================================================    UDS interpretation: Compliant          Medication Assessment Form: Reviewed. Patient indicates being compliant with therapy Treatment compliance: Compliant Risk Assessment Profile: Aberrant behavior: See prior evaluations. None observed or detected today Comorbid factors increasing risk of overdose: See prior notes. No additional risks detected today Risk of substance use disorder (SUD): Low Opioid Risk Tool - 09/28/17 0938      Family History of Substance Abuse   Alcohol  Negative    Illegal Drugs  Negative    Rx Drugs  Negative      Personal History of Substance Abuse   Alcohol  Positive Male or Male    Illegal Drugs  Negative    Rx Drugs   Negative      Age   Age between 87-45 years   No      History of Preadolescent Sexual Abuse   History of Preadolescent Sexual Abuse  Negative or Male      Psychological Disease   Psychological Disease  Negative    Depression  Negative      Total Score   Opioid Risk Tool Scoring  3    Opioid Risk Interpretation  Low Risk      ORT Scoring interpretation table:  Score <3 = Low Risk for SUD  Score between 4-7 = Moderate Risk for SUD  Score >8 = High Risk for Opioid Abuse   Risk Mitigation Strategies:  Patient Counseling: Covered Patient-Prescriber Agreement (PPA): Present and active  Notification to other healthcare providers: Done  Pharmacologic Plan: No change in therapy, at this time  Laboratory Chemistry  Inflammation Markers (CRP: Acute Phase) (ESR: Chronic Phase) Lab Results  Component Value Date   CRP 0.4 08/31/2017   ESRSEDRATE 3 08/31/2017                 Renal Function Markers Lab Results  Component Value Date   BUN 17 08/31/2017   CREATININE 1.03 08/31/2017   GFRAA 80 08/31/2017   GFRNONAA 69 08/31/2017                 Hepatic Function Markers Lab Results  Component Value Date   AST 19 08/31/2017   ALT 13 (L) 05/28/2016   ALBUMIN 4.6 08/31/2017   ALKPHOS 60 08/31/2017                 Electrolytes Lab Results  Component Value Date   NA 141 08/31/2017   K 3.5 08/31/2017   CL 101 08/31/2017   CALCIUM 9.8 08/31/2017   MG 1.8 08/31/2017                 Neuropathy Markers Lab Results  Component Value Date   VITAMINB12 416 08/31/2017                 Bone Pathology Markers Lab Results  Component Value Date   ALKPHOS 60 08/31/2017   25OHVITD1 31 08/31/2017   25OHVITD2 <1.0 08/31/2017   25OHVITD3 31 08/31/2017   CALCIUM 9.8 08/31/2017                 Rheumatology Markers No results found for: LABURIC  Coagulation Parameters Lab Results  Component Value Date   INR 1.14 05/28/2016   LABPROT 14.8 05/28/2016   APTT 37  05/28/2016   PLT 78 (L) 06/03/2016                 Cardiovascular Markers Lab Results  Component Value Date   HGB 11.1 (L) 06/03/2016   HCT 35.1 (L) 06/03/2016                 CA Markers No results found for: CEA, CA125, LABCA2               Note: Lab results reviewed.  Recent Diagnostic Imaging Results  DG Lumb Spine Flex&Ext Only CLINICAL DATA:  Progressive lumbago with radicular symptoms  EXAM: LUMBAR SPINE FLEX AND EXTEND ONLY - 2-3 VIEW  COMPARISON:  None.  FINDINGS: Neutral lateral, flexion lateral, and extension lateral lumbar images were obtained. Patient's ability to flex and extend is limited. No evident fracture or spondylolisthesis. No appreciable change in lateral alignment with flexion-extension. There is moderate disc space narrowing at L4-5. There is slightly milder disc space narrowing at L1-2 and L3-4. No erosive change. There is aortoiliac atherosclerosis.  IMPRESSION: Osteoarthritic changes several levels. No appreciable fracture or spondylolisthesis. No appreciable change in lateral alignment with flexion-extension. Patient's ability to flex and extend appears limited. There is aortoiliac atherosclerosis.  Aortic Atherosclerosis (ICD10-I70.0).  Electronically Signed   By: Lowella Grip III M.D.   On: 09/01/2017 08:57 DG Si Joints CLINICAL DATA:  Sacroiliac pain  EXAM: BILATERAL SACROILIAC JOINTS - 3+ VIEW  COMPARISON:  CT abdomen pelvis of 05/07/2016  FINDINGS: The SI joints appear well corticated. There is no evidence of sacroiliitis. The bones are perhaps slightly osteopenic. Mild degenerative changes noted in the hips. Degenerative change also is noted in the lower lumbar spine.  IMPRESSION: The SI joints appear normally corticated. Mild degenerative change of the lower lumbar spine and in the hips.  Electronically Signed   By: Ivar Drape M.D.   On: 09/01/2017 08:57  Complexity Note: Imaging results reviewed. Results  shared with Mr. Wiley, using Layman's terms.                         Meds   Current Outpatient Medications:  .  acetaminophen (TYLENOL) 325 MG tablet, Take 650 mg by mouth daily as needed for mild pain or headache. , Disp: , Rfl:  .  aspirin EC 81 MG tablet, Take 1 tablet (81 mg total) by mouth daily., Disp: 90 tablet, Rfl: 3 .  atorvastatin (LIPITOR) 80 MG tablet, TAKE 1 TABLET BY MOUTH  DAILY, Disp: 90 tablet, Rfl: 1 .  clopidogrel (PLAVIX) 75 MG tablet, TAKE 1 TABLET BY MOUTH  DAILY WITH BREAKFAST, Disp: 90 tablet, Rfl: 1 .  gabapentin (NEURONTIN) 100 MG capsule, Take 1-3 capsules (100-300 mg total) 4 (four) times daily by mouth. Follow written titration schedule., Disp: 360 capsule, Rfl: 2 .  hydrochlorothiazide (HYDRODIURIL) 25 MG tablet, Take 25 mg by mouth daily., Disp: , Rfl:  .  isosorbide dinitrate (ISORDIL) 10 MG tablet, Take 20 mg by mouth every morning. Take 10 mg by mouth in the afternoon, Disp: , Rfl:  .  lisinopril (PRINIVIL,ZESTRIL) 20 MG tablet, TAKE 1 TABLET BY MOUTH  DAILY, Disp: 90 tablet, Rfl: 1 .  Multiple Vitamins-Minerals (PRESERVISION AREDS PO), Take 1 tablet by mouth 2 (two) times daily., Disp: , Rfl:  .  nitroGLYCERIN (NITROSTAT) 0.4 MG SL  tablet, Place 0.4 mg under the tongue every 5 (five) minutes as needed for chest pain., Disp: , Rfl:  .  pantoprazole (PROTONIX) 40 MG tablet, Take 20 mg by mouth daily., Disp: , Rfl:  .  tamsulosin (FLOMAX) 0.4 MG CAPS capsule, Take 1 capsule (0.4 mg total) by mouth daily., Disp: 30 capsule, Rfl: 3 .  [START ON 12/12/2017] traMADol (ULTRAM) 50 MG tablet, Take 1 tablet (50 mg total) daily by mouth., Disp: 90 tablet, Rfl: 0 .  TURMERIC PO, Take at bedtime by mouth., Disp: , Rfl:   ROS  Constitutional: Denies any fever or chills Gastrointestinal: No reported hemesis, hematochezia, vomiting, or acute GI distress Musculoskeletal: Denies any acute onset joint swelling, redness, loss of ROM, or weakness Neurological: No reported  episodes of acute onset apraxia, aphasia, dysarthria, agnosia, amnesia, paralysis, loss of coordination, or loss of consciousness  Allergies  Mr. Yeley has No Known Allergies.  PFSH  Drug: Mr. Kellman  reports that he does not use drugs. Alcohol:  reports that he does not drink alcohol. Tobacco:  reports that he quit smoking about 27 years ago. His smoking use included cigarettes. He has a 50.00 pack-year smoking history. he has never used smokeless tobacco. Medical:  has a past medical history of AS (aortic stenosis), CAD (coronary artery disease), Carotid bruit, GERD (gastroesophageal reflux disease), History of kidney stones, HOH (hard of hearing), HTN (hypertension), Hyperlipidemia, Myocardial infarction ([place]) (1996), Nephrolithiasis, OSA (obstructive sleep apnea), PAD (peripheral artery disease) ([place]) (02/2014), Shortness of breath, and Thrombocytopenia ([place]). Surgical: Mr. Geraci  has a past surgical history that includes Stomach surgery; Coronary artery bypass graft (1991); Cardiac catheterization (02/2014); Cysto; TRANSCATHETER AORTIC VALVE REPLACEMENT, TRANSFEMORAL (N/A, 06/01/2016); TRANSESOPHAGEAL ECHOCARDIOGRAM (TEE) (N/A, 06/01/2016); Right/Left Heart Cath and Coronary Angiography (N/A, 05/26/2016); Right/Left Heart Cath and Coronary/Graft Angiography (N/A, 07/31/2015); Coronary Stent Intervention (N/A, 07/31/2015); LEFT HEART CATHETERIZATION WITH CORONARY/GRAFT ANGIOGRAM (N/A, 03/06/2014); ABDOMINAL AORTAGRAM (N/A, 03/06/2014); and HERNIA REPAIR INGUINAL ADULT (Right, 11/10/2011). Family: family history includes Heart disease in his father and mother.  Constitutional Exam  General appearance: Well nourished, well developed, and well hydrated. In no apparent acute distress Vitals:   10/26/17 1332  BP: (!) 139/59  Pulse: 77  Resp: 16  Temp: 97.8 F (36.6 C)  TempSrc: Oral  SpO2: 97%  Weight: 185 lb (83.9 kg)  Height: 5' 9"  (1.753 m)   BMI Assessment: Estimated body mass index is 27.32 kg/m  as calculated from the following:   Height as of this encounter: 5' 9"  (1.753 m).   Weight as of this encounter: 185 lb (83.9 kg).  BMI interpretation table: BMI level Category Range association with higher incidence of chronic pain  <18 kg/m2 Underweight   18.5-24.9 kg/m2 Ideal body weight   25-29.9 kg/m2 Overweight Increased incidence by 20%  30-34.9 kg/m2 Obese (Class I) Increased incidence by 68%  35-39.9 kg/m2 Severe obesity (Class II) Increased incidence by 136%  >40 kg/m2 Extreme obesity (Class III) Increased incidence by 254%   BMI Readings from Last 4 Encounters:  10/26/17 27.32 kg/m  09/28/17 28.06 kg/m  08/31/17 28.06 kg/m  05/23/17 29.10 kg/m   Wt Readings from Last 4 Encounters:  10/26/17 185 lb (83.9 kg)  09/28/17 190 lb (86.2 kg)  08/31/17 190 lb (86.2 kg)  05/23/17 191 lb 6.4 oz (86.8 kg)  Psych/Mental status: Alert, oriented x 3 (person, place, & time)       Eyes: PERLA Respiratory: No evidence of acute respiratory distress  Cervical Spine Area Exam  Skin & Axial Inspection: No masses, redness, edema, swelling, or associated skin lesions Alignment: Symmetrical Functional ROM: Unrestricted ROM      Stability: No instability detected Muscle Tone/Strength: Functionally intact. No obvious neuro-muscular anomalies detected. Sensory (Neurological): Unimpaired Palpation: No palpable anomalies              Upper Extremity (UE) Exam    Side: Right upper extremity  Side: Left upper extremity  Skin & Extremity Inspection: Skin color, temperature, and hair growth are WNL. No peripheral edema or cyanosis. No masses, redness, swelling, asymmetry, or associated skin lesions. No contractures.  Skin & Extremity Inspection: Skin color, temperature, and hair growth are WNL. No peripheral edema or cyanosis. No masses, redness, swelling, asymmetry, or associated skin lesions. No contractures.  Functional ROM: Unrestricted ROM          Functional ROM: Unrestricted ROM           Muscle Tone/Strength: Functionally intact. No obvious neuro-muscular anomalies detected.  Muscle Tone/Strength: Functionally intact. No obvious neuro-muscular anomalies detected.  Sensory (Neurological): Unimpaired          Sensory (Neurological): Unimpaired          Palpation: No palpable anomalies              Palpation: No palpable anomalies              Specialized Test(s): Deferred         Specialized Test(s): Deferred          Thoracic Spine Area Exam  Skin & Axial Inspection: No masses, redness, or swelling Alignment: Symmetrical Functional ROM: Unrestricted ROM Stability: No instability detected Muscle Tone/Strength: Functionally intact. No obvious neuro-muscular anomalies detected. Sensory (Neurological): Unimpaired Muscle strength & Tone: No palpable anomalies  Lumbar Spine Area Exam  Skin & Axial Inspection: No masses, redness, or swelling Alignment: Symmetrical Functional ROM: Unrestricted ROM      Stability: No instability detected Muscle Tone/Strength: Functionally intact. No obvious neuro-muscular anomalies detected. Sensory (Neurological): Unimpaired Palpation: No palpable anomalies       Provocative Tests: Lumbar Hyperextension and rotation test: evaluation deferred today       Lumbar Lateral bending test: evaluation deferred today       Patrick's Maneuver: evaluation deferred today                    Gait & Posture Assessment  Ambulation: Unassisted Gait: Relatively normal for age and body habitus Posture: WNL   Lower Extremity Exam    Side: Right lower extremity  Side: Left lower extremity  Skin & Extremity Inspection: Skin color, temperature, and hair growth are WNL. No peripheral edema or cyanosis. No masses, redness, swelling, asymmetry, or associated skin lesions. No contractures.  Skin & Extremity Inspection: Skin color, temperature, and hair growth are WNL. No peripheral edema or cyanosis. No masses, redness, swelling, asymmetry, or associated skin  lesions. No contractures.  Functional ROM: Unrestricted ROM          Functional ROM: Unrestricted ROM          Muscle Tone/Strength: Functionally intact. No obvious neuro-muscular anomalies detected.  Muscle Tone/Strength: Functionally intact. No obvious neuro-muscular anomalies detected.  Sensory (Neurological): Unimpaired  Sensory (Neurological): Unimpaired  Palpation: No palpable anomalies  Palpation: No palpable anomalies   Assessment  Primary Diagnosis & Pertinent Problem List: The primary encounter diagnosis was Chronic low back pain (Primary Area of Pain) (Bilateral) (L>R). Diagnoses of Chronic sacroiliac joint pain (Bilateral) (L>R), Lumbar facet  syndrome (Bilateral) (L>R), Lumbar facet osteoarthritis, Lumbar spondylosis, Neurogenic pain, Chronic pain syndrome, Long term current use of anticoagulant (Plavix), and Opiate use were also pertinent to this visit.  Status Diagnosis  Controlled Controlled Controlled 1. Chronic low back pain (Primary Area of Pain) (Bilateral) (L>R)   2. Chronic sacroiliac joint pain (Bilateral) (L>R)   3. Lumbar facet syndrome (Bilateral) (L>R)   4. Lumbar facet osteoarthritis   5. Lumbar spondylosis   6. Neurogenic pain   7. Chronic pain syndrome   8. Long term current use of anticoagulant (Plavix)   9. Opiate use     Problems updated and reviewed during this visit: No problems updated. Plan of Care  Pharmacotherapy (Medications Ordered): Meds ordered this encounter  Medications  . DISCONTD: gabapentin (NEURONTIN) 100 MG capsule    Sig: Take 1-3 capsules (100-300 mg total) 4 (four) times daily by mouth. Follow written titration schedule.    Dispense:  360 capsule    Refill:  2    Do not place medication on "Automatic Refill". Fill one day early if pharmacy is closed on scheduled refill date.  . traMADol (ULTRAM) 50 MG tablet    Sig: Take 1 tablet (50 mg total) daily by mouth.    Dispense:  90 tablet    Refill:  0    Fill one day early if  pharmacy is closed on scheduled refill date. Do not fill until: 12/12/17 To last until: 03/12/18  . gabapentin (NEURONTIN) 100 MG capsule    Sig: Take 1-3 capsules (100-300 mg total) 4 (four) times daily by mouth. Follow written titration schedule.    Dispense:  360 capsule    Refill:  2    Do not place medication on "Automatic Refill". Fill one day early if pharmacy is closed on scheduled refill date.  This SmartLink is deprecated. Use AVSMEDLIST instead to display the medication list for a patient. Medications administered today: Nehemiah Settle. Sigmund had no medications administered during this visit.   Procedure Orders     LUMBAR FACET(MEDIAL BRANCH NERVE BLOCK) MBNB     SACROILIAC JOINT INJECTION Lab Orders  No laboratory test(s) ordered today   Imaging Orders  No imaging studies ordered today   Referral Orders  No referral(s) requested today    Interventional management options: Planned, scheduled, and/or pending:   NOTE: Stop Plavix for 7 days for procedures. Diagnostic bilateral lumbar facet block  + diagnostic bilateral sacroiliac joint block under fluoroscopic guidance and IV sedation    Considering:   Diagnostic bilateral Lumbar facet block  Possiblebilateral lumbar facet RFA  Diagnostic bilateral SI joint injection  Possible bilateral SI joint RFA  Diagnostic bilateral hip injections    Palliative PRN treatment(s):   None at this time   Provider-requested follow-up: Return for Procedure (w/ sedation): (B) L-FCT BLK + SI BLK, (Blood-thinner Protocol).  Future Appointments  Date Time Provider [place]  11/15/2017  8:00 AM Milinda Pointer, MD [facility] None   Primary Care Physician: Vesta Mixer Location: [facility] Note by: Gaspar Cola, MD Date: 10/26/2017; Time: 5:38 PM

## 2017-10-26 ENCOUNTER — Other Ambulatory Visit: Payer: Self-pay

## 2017-10-26 ENCOUNTER — Encounter: Payer: Self-pay | Admitting: Pain Medicine

## 2017-10-26 ENCOUNTER — Ambulatory Visit: Payer: Medicare Other | Attending: Pain Medicine | Admitting: Pain Medicine

## 2017-10-26 VITALS — BP 139/59 | HR 77 | Temp 97.8°F | Resp 16 | Ht 69.0 in | Wt 185.0 lb

## 2017-10-26 DIAGNOSIS — I739 Peripheral vascular disease, unspecified: Secondary | ICD-10-CM | POA: Insufficient documentation

## 2017-10-26 DIAGNOSIS — I10 Essential (primary) hypertension: Secondary | ICD-10-CM | POA: Diagnosis not present

## 2017-10-26 DIAGNOSIS — G8929 Other chronic pain: Secondary | ICD-10-CM | POA: Diagnosis not present

## 2017-10-26 DIAGNOSIS — M5442 Lumbago with sciatica, left side: Secondary | ICD-10-CM

## 2017-10-26 DIAGNOSIS — M47896 Other spondylosis, lumbar region: Secondary | ICD-10-CM | POA: Insufficient documentation

## 2017-10-26 DIAGNOSIS — M545 Low back pain: Secondary | ICD-10-CM | POA: Diagnosis not present

## 2017-10-26 DIAGNOSIS — M5441 Lumbago with sciatica, right side: Secondary | ICD-10-CM | POA: Diagnosis not present

## 2017-10-26 DIAGNOSIS — M792 Neuralgia and neuritis, unspecified: Secondary | ICD-10-CM | POA: Diagnosis not present

## 2017-10-26 DIAGNOSIS — Z87891 Personal history of nicotine dependence: Secondary | ICD-10-CM | POA: Diagnosis not present

## 2017-10-26 DIAGNOSIS — I252 Old myocardial infarction: Secondary | ICD-10-CM | POA: Insufficient documentation

## 2017-10-26 DIAGNOSIS — Z5181 Encounter for therapeutic drug level monitoring: Secondary | ICD-10-CM | POA: Diagnosis not present

## 2017-10-26 DIAGNOSIS — E785 Hyperlipidemia, unspecified: Secondary | ICD-10-CM | POA: Diagnosis not present

## 2017-10-26 DIAGNOSIS — G894 Chronic pain syndrome: Secondary | ICD-10-CM | POA: Diagnosis not present

## 2017-10-26 DIAGNOSIS — M533 Sacrococcygeal disorders, not elsewhere classified: Secondary | ICD-10-CM | POA: Diagnosis not present

## 2017-10-26 DIAGNOSIS — Z79899 Other long term (current) drug therapy: Secondary | ICD-10-CM | POA: Diagnosis not present

## 2017-10-26 DIAGNOSIS — I2510 Atherosclerotic heart disease of native coronary artery without angina pectoris: Secondary | ICD-10-CM | POA: Diagnosis not present

## 2017-10-26 DIAGNOSIS — I350 Nonrheumatic aortic (valve) stenosis: Secondary | ICD-10-CM | POA: Insufficient documentation

## 2017-10-26 DIAGNOSIS — D696 Thrombocytopenia, unspecified: Secondary | ICD-10-CM | POA: Diagnosis not present

## 2017-10-26 DIAGNOSIS — F1190 Opioid use, unspecified, uncomplicated: Secondary | ICD-10-CM

## 2017-10-26 DIAGNOSIS — Z79891 Long term (current) use of opiate analgesic: Secondary | ICD-10-CM | POA: Diagnosis not present

## 2017-10-26 DIAGNOSIS — Z7901 Long term (current) use of anticoagulants: Secondary | ICD-10-CM | POA: Diagnosis not present

## 2017-10-26 DIAGNOSIS — G4733 Obstructive sleep apnea (adult) (pediatric): Secondary | ICD-10-CM | POA: Insufficient documentation

## 2017-10-26 DIAGNOSIS — K219 Gastro-esophageal reflux disease without esophagitis: Secondary | ICD-10-CM | POA: Insufficient documentation

## 2017-10-26 DIAGNOSIS — M47816 Spondylosis without myelopathy or radiculopathy, lumbar region: Secondary | ICD-10-CM | POA: Diagnosis not present

## 2017-10-26 DIAGNOSIS — Z7982 Long term (current) use of aspirin: Secondary | ICD-10-CM | POA: Insufficient documentation

## 2017-10-26 MED ORDER — GABAPENTIN 100 MG PO CAPS: 100.0000 mg | ORAL_CAPSULE | Freq: Four times a day (QID) | ORAL | 2 refills | Status: DC

## 2017-10-26 MED ORDER — TRAMADOL HCL 50 MG PO TABS: 50.0000 mg | ORAL_TABLET | Freq: Every day | ORAL | 0 refills | Status: DC

## 2017-10-26 NOTE — Progress Notes (Signed)
S 

## 2017-10-26 NOTE — Progress Notes (Signed)
Nursing Pain Medication Assessment:  Safety precautions to be maintained throughout the outpatient stay will include: orient to surroundings, keep bed in low position, maintain call bell within reach at all times, provide assistance with transfer out of bed and ambulation.  Medication Inspection Compliance: Pill count conducted under aseptic conditions, in front of the patient. Neither the pills nor the bottle was removed from the patient's sight at any time. Once count was completed pills were immediately returned to the patient in their original bottle.  Medication: See above Pill/Patch Count: 68 of 90 pills remain Pill/Patch Appearance: Markings consistent with prescribed medication Bottle Appearance: Standard pharmacy container. Clearly labeled. Filled Date: 10/20 / 2018 Last Medication intake:  Today

## 2017-10-26 NOTE — Patient Instructions (Addendum)
____________________________________________________________________________________________  Preparing for Procedure with Sedation Instructions: . Oral Intake: Do not eat or drink anything for at least 8 hours prior to your procedure. . Transportation: Public transportation is not allowed. Bring an adult driver. The driver must be physically present in our waiting room before any procedure can be started. [facility] Kitchen Physical Assistance: Bring an adult physically capable of assisting you, in the event you need help. This adult should keep you company at home for at least 6 hours after the procedure. . Blood Pressure Medicine: Take your blood pressure medicine with a sip of water the morning of the procedure. . Blood thinners:  . Diabetics on insulin: Notify the staff so that you can be scheduled 1st case in the morning. If your diabetes requires high dose insulin, take only  of your normal insulin dose the morning of the procedure and notify the staff that you have done so. . Preventing infections: Shower with an antibacterial soap the morning of your procedure. . Build-up your immune system: Take 1000 mg of Vitamin C with every meal (3 times a day) the day prior to your procedure. Marland Kitchen Antibiotics: Inform the staff if you have a condition or reason that requires you to take antibiotics before dental procedures. . Pregnancy: If you are pregnant, call and cancel the procedure. . Sickness: If you have a cold, fever, or any active infections, call and cancel the procedure. . Arrival: You must be in the facility at least 30 minutes prior to your scheduled procedure. . Children: Do not bring children with you. . Dress appropriately: Bring dark clothing that you would not mind if they get stained. . Valuables: Do not bring any jewelry or valuables. Procedure appointments are reserved for interventional treatments only. Marland Kitchen No Prescription Refills. . No medication changes will be discussed during procedure  appointments. . No disability issues will be discussed. ____________________________________________________________________________________________   ____________________________________________________________________________________________  Gabapentin Titration  Medication used: Gabapentin (Generic Name) or Neurontin (Brand Name) 100 mg tablets/capsules  Reasons to stop increasing the dose:  Reason 1: You get good relief of symptoms, in which case there is no need to increase the daily dose any further.    Reason 2: You develop some side effects, such as sleeping all of the time, difficulty concentrating, or becoming disoriented, in which case you need to go down on the dose, to the prior level, where you were not experiencing any side effects. Stay on that dose longer, to allow more time for your body to get use it, before attempting to increase it again.   Steps to increase medication: Step 1: Start by taking 1 (one) tablet at bedtime x 7 (seven) days.  Step 2: Increase dose to 2 (two) tablets at bedtime. Stay on this dose x 7 (seven) days.  Step 3: Next increase it to 3 (three) tablets at bedtime. Stay on this dose x another 7 (seven) days.  Step 4: Next, add 1 (one) tablet at noon with lunch. Continue this dose x another 7 (seven) days.  Step 5: Add 1 (one) tablet in the afternoon with dinner. Stay on this dose x another 7 (seven) days.  Step 6: At this point you should be taking the medicine 4 (four) times a day. This daily regimen of taking the medicine 4 (four) times a day, will be maintained from now on. You should not take any doses any sooner than every 6 (six) hours.  Step 7: After 7 (seven) days of taking 3 (three) tablet at  bedtime, 1 (one) tablet at noon, 1 (one) tablet in the afternoon, and 1 (one) tablet in the morning, begin taking 2 (two) tablets at noon with lunch. Stay on this dose x another 7 (seven) days.   Step 8: After 7 (seven) days of taking 3 (three)  tablet at bedtime, 2 (two) tablets at noon, 1 (one) tablet in the afternoon, and 1 (one) tablet in the morning, begin taking 2 (two) tablets in the afternoon with dinner. Stay on this dose x another 7 (seven) days.   Step 9: After 7 (seven) days of taking 3 (three) tablet at bedtime, 2 (two) tablets at noon, 2 (two) tablets in the afternoon, and 1 (one) tablet in the morning, begin taking 2 (two) tablets in the morning with breakfast. Stay on this dose x another 7 (seven) days. At this point you should be taking the medicine 4 (four) times a day, or about every 6 (six) hours. This daily regimen of taking the medicine 4 (four) times a day, will be maintained from now on. You should not take any doses any sooner than every 6 (six) hours.  Step 10: After 7 (seven) days of taking 3 (three) tablet at bedtime, 2 (two) tablets at noon, 2 (two) tablets in the afternoon, and 2 (two) tablets in the morning, begin taking 3 (three) tablets at noon with lunch. Stay on this dose x another 7 (seven) days.   Step 11: After 7 (seven) days of taking 3 (three) tablet at bedtime, 3 (three) tablets at noon, 2 (two) tablets in the afternoon, and 2 (two) tablets in the morning, begin taking 3 (three) tablets in the afternoon with dinner. Stay on this dose x another 7 (seven) days.   Step 12: After 7 (seven) days of taking 3 (three) tablet at bedtime, 3 (three) tablets at noon, 3 (three) tablets in the afternoon, and 2 (two) tablet in the morning, begin taking 3 (three) tablets in the morning with breakfast. Stay on this dose x another 7 (seven) days. At this point you should be taking the medicine 4 (four) times a day, or about every 6 (six) hours. This daily regimen of taking the medicine 4 (four) times a day, will be maintained from now on.   Endpoint: Once you have reached the maximum dose you can tolerate without side-effects, contact your physician so as to evaluate the results of the regimen.   Questions: Feel free to  contact us for any questions or problems at [phone]  Stop Plavix 7 days before procedure. ____________________________________________________________________________________________

## 2017-11-15 ENCOUNTER — Encounter: Payer: Self-pay | Admitting: Pain Medicine

## 2017-11-15 ENCOUNTER — Ambulatory Visit (HOSPITAL_BASED_OUTPATIENT_CLINIC_OR_DEPARTMENT_OTHER): Payer: Medicare Other | Admitting: Pain Medicine

## 2017-11-15 ENCOUNTER — Other Ambulatory Visit: Payer: Self-pay

## 2017-11-15 ENCOUNTER — Ambulatory Visit
Admission: RE | Admit: 2017-11-15 | Discharge: 2017-11-15 | Disposition: A | Discharge status: Home / Self Care | Payer: Medicare Other | Source: Ambulatory Visit | Attending: Pain Medicine | Admitting: Pain Medicine

## 2017-11-15 VITALS — BP 118/64 | HR 64 | Temp 98.0°F | Resp 16 | Ht 69.0 in | Wt 185.0 lb

## 2017-11-15 DIAGNOSIS — Z7902 Long term (current) use of antithrombotics/antiplatelets: Secondary | ICD-10-CM | POA: Diagnosis not present

## 2017-11-15 DIAGNOSIS — M533 Sacrococcygeal disorders, not elsewhere classified: Secondary | ICD-10-CM

## 2017-11-15 DIAGNOSIS — G8929 Other chronic pain: Secondary | ICD-10-CM | POA: Insufficient documentation

## 2017-11-15 DIAGNOSIS — Z79899 Other long term (current) drug therapy: Secondary | ICD-10-CM | POA: Diagnosis not present

## 2017-11-15 DIAGNOSIS — Z79891 Long term (current) use of opiate analgesic: Secondary | ICD-10-CM | POA: Insufficient documentation

## 2017-11-15 DIAGNOSIS — M47816 Spondylosis without myelopathy or radiculopathy, lumbar region: Secondary | ICD-10-CM | POA: Diagnosis not present

## 2017-11-15 DIAGNOSIS — M5442 Lumbago with sciatica, left side: Secondary | ICD-10-CM

## 2017-11-15 DIAGNOSIS — M5441 Lumbago with sciatica, right side: Secondary | ICD-10-CM | POA: Diagnosis not present

## 2017-11-15 DIAGNOSIS — M549 Dorsalgia, unspecified: Secondary | ICD-10-CM | POA: Diagnosis present

## 2017-11-15 DIAGNOSIS — M79605 Pain in left leg: Secondary | ICD-10-CM

## 2017-11-15 DIAGNOSIS — Z9889 Other specified postprocedural states: Secondary | ICD-10-CM | POA: Diagnosis not present

## 2017-11-15 DIAGNOSIS — M79604 Pain in right leg: Secondary | ICD-10-CM | POA: Diagnosis present

## 2017-11-15 DIAGNOSIS — M5410 Radiculopathy, site unspecified: Secondary | ICD-10-CM

## 2017-11-15 MED ORDER — ROPIVACAINE HCL 2 MG/ML IJ SOLN
9.0000 mL | Freq: Once | INTRAMUSCULAR | Status: AC
  Administered 2017-11-15: 10 mL via PERINEURAL
  Filled 2017-11-15: qty 10

## 2017-11-15 MED ORDER — FENTANYL CITRATE (PF) 100 MCG/2ML IJ SOLN
25.0000 ug | INTRAMUSCULAR | Status: DC | PRN
  Administered 2017-11-15: 50 ug via INTRAVENOUS
  Filled 2017-11-15: qty 2

## 2017-11-15 MED ORDER — TRIAMCINOLONE ACETONIDE 40 MG/ML IJ SUSP
40.0000 mg | Freq: Once | INTRAMUSCULAR | Status: AC
  Administered 2017-11-15: 40 mg
  Filled 2017-11-15: qty 1

## 2017-11-15 MED ORDER — LIDOCAINE HCL 2 % IJ SOLN
10.0000 mL | Freq: Once | INTRAMUSCULAR | Status: AC
  Administered 2017-11-15: 400 mg
  Filled 2017-11-15: qty 20

## 2017-11-15 MED ORDER — MIDAZOLAM HCL 5 MG/5ML IJ SOLN
1.0000 mg | INTRAMUSCULAR | Status: DC | PRN
  Administered 2017-11-15: 1 mg via INTRAVENOUS
  Filled 2017-11-15: qty 5

## 2017-11-15 MED ORDER — ROPIVACAINE HCL 2 MG/ML IJ SOLN
9.0000 mL | Freq: Once | INTRAMUSCULAR | Status: AC
Start: 2017-11-15 — End: 2017-11-15
  Administered 2017-11-15: 10 mL via INTRA_ARTICULAR
  Filled 2017-11-15: qty 10

## 2017-11-15 MED ORDER — METHYLPREDNISOLONE ACETATE 80 MG/ML IJ SUSP
80.0000 mg | Freq: Once | INTRAMUSCULAR | Status: AC
  Administered 2017-11-15: 80 mg via INTRA_ARTICULAR
  Filled 2017-11-15: qty 1

## 2017-11-15 MED ORDER — LACTATED RINGERS IV SOLN
1000.0000 mL | Freq: Once | INTRAVENOUS | Status: AC
  Administered 2017-11-15: 1000 mL via INTRAVENOUS

## 2017-11-15 NOTE — Progress Notes (Signed)
Patient's Name: Matthew [name]  MRN: [id number]  Referring Provider: Vesta Mixer  DOB: 30-Dec-1935  PCP: Vesta Mixer  DOS: 11/15/2017  Note by: Gaspar Cola, MD  Service setting: Ambulatory outpatient  Specialty: Interventional Pain Management  Patient type: Established  Location: [facility] (AMB) Pain Management Facility  Visit type: Interventional Procedure   Primary Reason for Visit: Interventional Pain Management Treatment. CC: Leg Pain (bilateral, left is worse) and Back Pain  Procedure:  Anesthesia, Analgesia, Anxiolysis:  Procedure #1: Type: Diagnostic Medial Branch Facet Block Region: Lumbar Level: L2, L3, L4, L5, & S1 Medial Branch Level(s) Laterality: Bilateral  Procedure #2: Type: Diagnostic Sacroiliac Joint Block Region: Posterior Lumbosacral Level: PSIS (Posterior Superior Iliac Spine) Sacroiliac Joint Laterality: Bilateral  Type: Local Anesthesia with Moderate (Conscious) Sedation Local Anesthetic: Lidocaine 1% Route: Intravenous (IV) IV Access: Secured Sedation: Meaningful verbal contact was maintained at all times during the procedure  Indication(s): Analgesia and Anxiety   Indications: 1. Chronic low back pain (Primary Area of Pain) (Bilateral) (L>R)   2. Lumbar facet syndrome (Bilateral) (L>R)   3. Chronic sacroiliac joint pain (Bilateral) (L>R)   4. Lumbar facet osteoarthritis   5. Chronic lower extremity pain (Bilateral) (L>R)   6. Chronic lower extremity radicular pain (S1 dermatome) (Bilateral) (L>R)   7. Chronic bilateral low back pain with bilateral sciatica   8. Facet syndrome, lumbar   9. Chronic sacroiliac joint pain   10. Osteoarthritis of facet joint of lumbar spine    Pain Score: Pre-procedure: 7 /10 Post-procedure: 0-No pain/10  Pre-op Assessment:  Matthew [name] is a 81 y.o. (year old), male patient, seen today for interventional treatment. 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 Matthew [name] has a current medication list which includes the following prescription(s): acetaminophen, aspirin ec, atorvastatin, clopidogrel, gabapentin, hydrochlorothiazide, isosorbide dinitrate, lisinopril, multiple vitamins-minerals, nitroglycerin, pantoprazole, tamsulosin, tramadol, and turmeric, and the following Facility-Administered Medications: fentanyl and midazolam. His primarily concern today is the Leg Pain (bilateral, left is worse) and Back Pain  The patient stopped his Plavix for 7 days prior to the procedure.  He was instructed to resume taking it 2 hours after the procedure.  Initial Vital Signs: There were no vitals taken for this visit. BMI: Estimated body mass index is 27.32 kg/m as calculated from the following:   Height as of this encounter: 5\' 9"  (1.753 m).   Weight as of this encounter: 185 lb (83.9 kg).  Risk Assessment: Allergies: Reviewed. He has No Known Allergies.  Allergy Precautions: None required Coagulopathies: Reviewed. None identified.  Blood-thinner therapy: None at this time Active Infection(s): Reviewed. None identified. Matthew [name] is afebrile  Site Confirmation: Matthew [name] was asked to confirm the procedure and laterality before marking the site Procedure checklist: Completed Consent: Before the procedure and under the influence of no sedative(s), amnesic(s), or anxiolytics, the patient was informed of the treatment options, risks and possible complications. To fulfill our ethical and legal obligations, as recommended by  the American Medical Association's Code of Ethics, I have informed the patient of my clinical impression; the nature and purpose of the treatment or procedure; the risks, benefits, and possible complications of the  intervention; the alternatives, including doing nothing; the risk(s) and benefit(s) of the alternative treatment(s) or procedure(s); and the risk(s) and benefit(s) of doing nothing. The patient was provided information about the general risks and possible complications associated with the procedure. These may include, but are not limited to: failure to achieve desired goals, infection, bleeding, organ or nerve damage, allergic reactions, paralysis, and death. In addition, the patient was informed of those risks and complications associated to Spine-related procedures, such as failure to decrease pain; infection (i.e.: Meningitis, epidural or intraspinal abscess); bleeding (i.e.: epidural hematoma, subarachnoid hemorrhage, or any other type of intraspinal or peri-dural bleeding); organ or nerve damage (i.e.: Any type of peripheral nerve, nerve root, or spinal cord injury) with subsequent damage to sensory, motor, and/or autonomic systems, resulting in permanent pain, numbness, and/or weakness of one or several areas of the body; allergic reactions; (i.e.: anaphylactic reaction); and/or death. Furthermore, the patient was informed of those risks and complications associated with the medications. These include, but are not limited to: allergic reactions (i.e.: anaphylactic or anaphylactoid reaction(s)); adrenal axis suppression; blood sugar elevation that in diabetics may result in ketoacidosis or comma; water retention that in patients with history of congestive heart failure may result in shortness of breath, pulmonary edema, and decompensation with resultant heart failure; weight gain; swelling or edema; medication-induced neural toxicity; particulate matter embolism and blood vessel occlusion with  resultant organ, and/or nervous system infarction; and/or aseptic necrosis of one or more joints. Finally, the patient was informed that Medicine is not an exact science; therefore, there is also the possibility of unforeseen or unpredictable risks and/or possible complications that may result in a catastrophic outcome. The patient indicated having understood very clearly. We have given the patient no guarantees and we have made no promises. Enough time was given to the patient to ask questions, all of which were answered to the patient's satisfaction. Mr. Garrette has indicated that he wanted to continue with the procedure. Attestation: I, the ordering provider, attest that I have discussed with the patient the benefits, risks, side-effects, alternatives, likelihood of achieving goals, and potential problems during recovery for the procedure that I have provided informed consent. Date: 11/15/2017; Time: 6:47 AM  Pre-Procedure Preparation:  Monitoring: As per clinic protocol. Respiration, ETCO2, SpO2, BP, heart rate and rhythm monitor placed and checked for adequate function Safety Precautions: Patient was assessed for positional comfort and pressure points before starting the procedure. Time-out: I initiated and conducted the "Time-out" before starting the procedure, as per protocol. The patient was asked to participate by confirming the accuracy of the "Time Out" information. Verification of the correct person, site, and procedure were performed and confirmed by me, the nursing staff, and the patient. "Time-out" conducted as per Joint Commission's Universal Protocol (UP.01.01.01). "Time-out" Date & Time: 11/15/2017; 0848 hrs.  Description of Procedure #1 Process:   Time-out: "Time-out" completed before starting procedure, as per protocol. Position: Prone Target Area: For Lumbar Facet blocks, the target is the groove formed by the junction of the transverse process and superior articular process. For the L5  dorsal ramus, the target is the notch between superior articular process and sacral ala. For the S1 dorsal ramus, the target is the superior and lateral edge of the posterior S1 Sacral foramen. Approach: Paramedial approach. Area Prepped: Entire Posterior Lumbosacral Region Prepping solution: ChloraPrep (2% chlorhexidine gluconate and 70% isopropyl alcohol) Safety Precautions: Aspiration looking for blood return was conducted prior to all injections. At no point did we inject any  substances, as a needle was being advanced. No attempts were made at seeking any paresthesias. Safe injection practices and needle disposal techniques used. Medications properly checked for expiration dates. SDV (single dose vial) medications used.  Description of the Procedure: Protocol guidelines were followed. The patient was placed in position over the fluoroscopy table. The target area was identified and the area prepped in the usual manner. Skin desensitized using vapocoolant spray. Skin & deeper tissues infiltrated with local anesthetic. Appropriate amount of time allowed to pass for local anesthetics to take effect. The procedure needle was introduced through the skin, ipsilateral to the reported pain, and advanced to the target area. Employing the "Medial Branch Technique", the needles were advanced to the angle made by the superior and medial portion of the transverse process, and the lateral and inferior portion of the superior articulating process of the targeted vertebral bodies. This area is known as "Burton's Eye" or the "Eye of the [place] Dog". A procedure needle was introduced through the skin, and this time advanced to the angle made by the superior and medial border of the sacral ala, and the lateral border of the S1 vertebral body. This last needle was later repositioned at the superior and lateral border of the posterior S1 foramen. Negative aspiration confirmed. Solution injected in intermittent fashion, asking  for systemic symptoms every 0.5cc of injectate. The needles were then removed and the area cleansed, making sure to leave some of the prepping solution back to take advantage of its long term bactericidal properties. Start Time: 0849 hrs. Materials:  Needle(s) Type: Regular needle Gauge: 22G Length: 3.5-in Medication(s): We administered lactated ringers, midazolam, fentaNYL, lidocaine, triamcinolone acetonide, ropivacaine (PF) 2 mg/mL (0.2%), triamcinolone acetonide, ropivacaine (PF) 2 mg/mL (0.2%), ropivacaine (PF) 2 mg/mL (0.2%), and methylPREDNISolone acetate. Please see chart orders for dosing details.  Description of Procedure # 2 Process:   Position: Prone Target Area: For upper sacroiliac joint block(s), the target is the superior and posterior margin of the sacroiliac joint. Approach: Ipsilateral approach. Area Prepped: Entire Posterior Lumbosacral Region Prepping solution: ChloraPrep (2% chlorhexidine gluconate and 70% isopropyl alcohol) Safety Precautions: Aspiration looking for blood return was conducted prior to all injections. At no point did we inject any substances, as a needle was being advanced. No attempts were made at seeking any paresthesias. Safe injection practices and needle disposal techniques used. Medications properly checked for expiration dates. SDV (single dose vial) medications used. Description of the Procedure: Protocol guidelines were followed. The patient was placed in position over the fluoroscopy table. The target area was identified and the area prepped in the usual manner. Skin desensitized using vapocoolant spray. Skin & deeper tissues infiltrated with local anesthetic. Appropriate amount of time allowed to pass for local anesthetics to take effect. The procedure needle was advanced under fluoroscopic guidance into the sacroiliac joint until a firm endpoint was obtained. Proper needle placement secured. Negative aspiration confirmed. Solution injected in  intermittent fashion, asking for systemic symptoms every 0.5cc of injectate. The needles were then removed and the area cleansed, making sure to leave some of the prepping solution back to take advantage of its long term bactericidal properties. Vitals:   11/15/17 0902 11/15/17 0912 11/15/17 0922 11/15/17 0930  BP: 136/61 (!) 154/74 (!) 113/56 118/64  Pulse: (!) 55 (!) 58 (!) 58 64  Resp: 12 13 14 16   Temp:      SpO2: 94% 95% 93% 95%  Weight:      Height:  End Time: 0901 hrs. Materials:  Needle(s) Type: Regular needle Gauge: 22G Length: 3.5-in Medication(s): We administered lactated ringers, midazolam, fentaNYL, lidocaine, triamcinolone acetonide, ropivacaine (PF) 2 mg/mL (0.2%), triamcinolone acetonide, ropivacaine (PF) 2 mg/mL (0.2%), ropivacaine (PF) 2 mg/mL (0.2%), and methylPREDNISolone acetate. Please see chart orders for dosing details.  Imaging Guidance (Spinal):  Type of Imaging Technique: Fluoroscopy Guidance (Spinal) Indication(s): Assistance in needle guidance and placement for procedures requiring needle placement in or near specific anatomical locations not easily accessible without such assistance. Exposure Time: Please see nurses notes. Contrast: None used. Fluoroscopic Guidance: I was personally present during the use of fluoroscopy. "Tunnel Vision Technique" used to obtain the best possible view of the target area. Parallax error corrected before commencing the procedure. "Direction-depth-direction" technique used to introduce the needle under continuous pulsed fluoroscopy. Once target was reached, antero-posterior, oblique, and lateral fluoroscopic projection used confirm needle placement in all planes. Images permanently stored in EMR. Interpretation: No contrast injected. I personally interpreted the imaging intraoperatively. Adequate needle placement confirmed in multiple planes. Permanent images saved into the patient's record.  Antibiotic Prophylaxis:   Indication(s): None identified Antibiotic given: None  Post-operative Assessment:  EBL: None Complications: No immediate post-treatment complications observed by team, or reported by patient. Note: The patient tolerated the entire procedure well. A repeat set of vitals were taken after the procedure and the patient was kept under observation following institutional policy, for this type of procedure. Post-procedural neurological assessment was performed, showing return to baseline, prior to discharge. The patient was provided with post-procedure discharge instructions, including a section on how to identify potential problems. Should any problems arise concerning this procedure, the patient was given instructions to immediately contact us, at any time, without hesitation. In any case, we plan to contact the patient by telephone for a follow-up status report regarding this interventional procedure. Comments:  No additional relevant information.  Plan of Care   Possible POC:  The patient is now complaining of bilateral lower extremity pain down to the area of the calf but with numbness in both feet.  He is hard of hearing and therefore he is difficult to communicate with him.  However, he continues to being functional.  At this point, I will be ordering an MRI of the lumbar spine as there is a good possibility, based on his age and clinical symptoms, that he is suffering from central spinal stenosis affecting the S1 nerve root, bilaterally.  For today's procedure he stopped his Plavix for 7 days.  We will have him resume the Plavix today.  If he ends up having central spinal stenosis and continues to have lower extremity pain, then we will schedule him for a lumbar epidural steroid injection.    Imaging Orders     DG C-Arm 1-60 Min-No Report     MR LUMBAR SPINE WO CONTRAST  Procedure Orders     LUMBAR FACET(MEDIAL BRANCH NERVE BLOCK) MBNB     SACROILIAC JOINT INJECTION  Medications ordered for  procedure: Meds ordered this encounter  Medications  . lactated ringers infusion 1,000 mL  . midazolam (VERSED) 5 MG/5ML injection 1-2 mg    Make sure Flumazenil is available in the pyxis when using this medication. If oversedation occurs, administer 0.2 mg IV over 15 sec. If after 45 sec no response, administer 0.2 mg again over 1 min; may repeat at 1 min intervals; not to exceed 4 doses (1 mg)  . fentaNYL (SUBLIMAZE) injection 25-50 mcg    Make sure Narcan  is available in the pyxis when using this medication. In the event of respiratory depression (RR< 8/min): Titrate NARCAN (naloxone) in increments of 0.1 to 0.2 mg IV at 2-3 minute intervals, until desired degree of reversal.  . lidocaine (XYLOCAINE) 2 % (with pres) injection 200 mg  . triamcinolone acetonide (KENALOG-40) injection 40 mg  . ropivacaine (PF) 2 mg/mL (0.2%) (NAROPIN) injection 9 mL  . triamcinolone acetonide (KENALOG-40) injection 40 mg  . ropivacaine (PF) 2 mg/mL (0.2%) (NAROPIN) injection 9 mL  . ropivacaine (PF) 2 mg/mL (0.2%) (NAROPIN) injection 9 mL  . methylPREDNISolone acetate (DEPO-MEDROL) injection 80 mg   Medications administered: We administered lactated ringers, midazolam, fentaNYL, lidocaine, triamcinolone acetonide, ropivacaine (PF) 2 mg/mL (0.2%), triamcinolone acetonide, ropivacaine (PF) 2 mg/mL (0.2%), ropivacaine (PF) 2 mg/mL (0.2%), and methylPREDNISolone acetate.  See the medical record for exact dosing, route, and time of administration.  This SmartLink is deprecated. Use AVSMEDLIST instead to display the medication list for a patient. Disposition: Discharge home  Discharge Date & Time: 11/15/2017; 0931 hrs.   Physician-requested Follow-up: Return for post-procedure eval by Dr. Dossie Arbour in 2 wks. Future Appointments  Date Time Provider [place]  11/30/2017  2:00 PM Milinda Pointer, MD [facility] None   Primary Care Physician: Vesta Mixer Location: [facility] Outpatient Pain Management  Facility Note by: Gaspar Cola, MD Date: 11/15/2017; Time: 10:44 AM  Disclaimer:  Medicine is not an exact science. The only guarantee in medicine is that nothing is guaranteed. It is important to note that the decision to proceed with this intervention was based on the information collected from the patient. The Data and conclusions were drawn from the patient's questionnaire, the interview, and the physical examination. Because the information was provided in large part by the patient, it cannot be guaranteed that it has not been purposely or unconsciously manipulated. Every effort has been made to obtain as much relevant data as possible for this evaluation. It is important to note that the conclusions that lead to this procedure are derived in large part from the available data. Always take into account that the treatment will also be dependent on availability of resources and existing treatment guidelines, considered by other Pain Management Practitioners as being common knowledge and practice, at the time of the intervention. For Medico-Legal purposes, it is also important to point out that variation in procedural techniques and pharmacological choices are the acceptable norm. The indications, contraindications, technique, and results of the above procedure should only be interpreted and judged by a Board-Certified Interventional Pain Specialist with extensive familiarity and expertise in the same exact procedure and technique.

## 2017-11-15 NOTE — Patient Instructions (Signed)

## 2017-11-16 ENCOUNTER — Telehealth: Payer: Self-pay | Admitting: *Deleted

## 2017-11-16 NOTE — Telephone Encounter (Signed)
Attempted to call patient for post procedure follow-up. Message left. 

## 2017-11-30 ENCOUNTER — Ambulatory Visit: Payer: Self-pay | Admitting: Pain Medicine

## 2017-11-30 NOTE — Progress Notes (Deleted)
Patient's Name: Matthew [name]  MRN: [id number]  Referring Provider: Vesta Mixer  DOB: [birth year]  PCP: Vesta Mixer  DOS: 11/30/2017  Note by: Gaspar Cola, MD  Service setting: Ambulatory outpatient  Specialty: Interventional Pain Management  Location: [facility] (AMB) Pain Management Facility    Patient type: Established   Primary Reason(s) for Visit: Encounter for post-procedure evaluation of chronic illness with mild to moderate exacerbation CC: No chief complaint on file.  HPI  Matthew [name] is a 81 y.o. year old, male patient, who comes today for a post-procedure evaluation. He has HLD (hyperlipidemia); Obstructive sleep apnea; Essential hypertension; Coronary atherosclerosis; Aortic valve Replacement; Angina pectoris ([place]); PAD (peripheral artery disease) ([place]); Effort angina ([place]); Coronary artery disease; Thrombocytopenia ([place]); Severe aortic stenosis; Long term current use of opiate analgesic; Long term prescription opiate use; Opiate use; Chronic low back pain (Primary Area of Pain) (Bilateral) (L>R); Chronic lower extremity pain (Bilateral) (L>R); Chronic pain syndrome; Chronic sacroiliac joint pain (Bilateral) (L>R); Chronic hip pain; Pain; Long term current use of anticoagulant (Plavix); History of MI (myocardial infarction) (Last 2004); History of alcoholism ([place]); Neurogenic pain; Lumbar facet syndrome (Bilateral) (L>R); DDD (degenerative disc disease), lumbar; Lumbar facet osteoarthritis; Lumbar spondylosis; Gastroesophageal reflux disease without esophagitis; and Chronic lower extremity radicular pain (S1 dermatome) (Bilateral) (L>R) on their problem list. His primarily concern today is the No chief complaint on file.  Pain Assessment: Location:     Radiating:   Onset:   Duration:   Quality:   Severity:  /10 (self-reported pain score)  Note: Reported level is compatible with observation.                         When using our objective Pain Scale, levels between 6  and 10/10 are said to belong in an emergency room, as it progressively worsens from a 6/10, described as severely limiting, requiring emergency care not usually available at an outpatient pain management facility. At a 6/10 level, communication becomes difficult and requires great effort. Assistance to reach the emergency department may be required. Facial flushing and profuse sweating along with potentially dangerous increases in heart rate and blood pressure will be evident. Effect on ADL:   Timing:   Modifying factors:    Matthew [name] comes in today for post-procedure evaluation after the treatment done on 11/15/2017.  Further details on both, my assessment(s), as well as the proposed treatment plan, please see below.  Post-Procedure Assessment  11/15/2017 Procedure: Diagnostic bilateral lumbar facet block + diagnostic bilateral sacroiliac joint block #1 under fluoroscopic guidance and IV sedation Pre-procedure pain score:  7/10 Post-procedure pain score: 0/10 (100% relief) Influential Factors: BMI:   Intra-procedural challenges: None observed.         Assessment challenges: None detected.              Reported side-effects: None.        Post-procedural adverse reactions or complications: None reported         Sedation: Please see nurses note. When no sedatives are used, the analgesic levels obtained are directly associated to the effectiveness of the local anesthetics. However, when sedation is provided, the level of analgesia obtained during the initial 1 hour following the intervention, is believed to be the result of a combination of factors. These factors may include, but are not limited to: 1. The effectiveness of the local anesthetics used. 2. The effects of the analgesic(s) and/or anxiolytic(s) used. 3.  The degree of discomfort experienced by the patient at the time of the procedure. 4. The patients ability and reliability in recalling and recording the events. 5. The presence and  influence of possible secondary gains and/or psychosocial factors. Reported result: Relief experienced during the 1st hour after the procedure:   (Ultra-Short Term Relief)            Interpretative annotation: Clinically appropriate result. Analgesia during this period is likely to be Local Anesthetic and/or IV Sedative (Analgesic/Anxiolytic) related.          Effects of local anesthetic: The analgesic effects attained during this period are directly associated to the localized infiltration of local anesthetics and therefore cary significant diagnostic value as to the etiological location, or anatomical origin, of the pain. Expected duration of relief is directly dependent on the pharmacodynamics of the local anesthetic used. Long-acting (4-6 hours) anesthetics used.  Reported result: Relief during the next 4 to 6 hour after the procedure:   (Short-Term Relief)            Interpretative annotation: Clinically appropriate result. Analgesia during this period is likely to be Local Anesthetic-related.          Long-term benefit: Defined as the period of time past the expected duration of local anesthetics (1 hour for short-acting and 4-6 hours for long-acting). With the possible exception of prolonged sympathetic blockade from the local anesthetics, benefits during this period are typically attributed to, or associated with, other factors such as analgesic sensory neuropraxia, antiinflammatory effects, or beneficial biochemical changes provided by agents other than the local anesthetics.  Reported result: Extended relief following procedure:   (Long-Term Relief)            Interpretative annotation: Clinically appropriate result. Good relief. No permanent benefit expected. Inflammation plays a part in the etiology to the pain.          Current benefits: Defined as reported results that persistent at this point in time.   Analgesia: *** %            Function: Somewhat improved ROM: Somewhat  improved Interpretative annotation: Recurrence of symptoms. No permanent benefit expected. Effective diagnostic intervention.          Interpretation: Results would suggest a successful diagnostic intervention.                  Plan:  Please see "Plan of Care" for details.        Laboratory Chemistry  Inflammation Markers (CRP: Acute Phase) (ESR: Chronic Phase) Lab Results  Component Value Date   CRP 0.4 08/31/2017   ESRSEDRATE 3 08/31/2017                 Rheumatology Markers No results found for: RF, ANA, Therisa [place], [facility]              Renal Function Markers Lab Results  Component Value Date   BUN 17 08/31/2017   CREATININE 1.03 08/31/2017   GFRAA 80 08/31/2017   GFRNONAA 69 08/31/2017                 Hepatic Function Markers Lab Results  Component Value Date   AST 19 08/31/2017   ALT 13 (L) 05/28/2016   ALBUMIN 4.6 08/31/2017   ALKPHOS 60 08/31/2017                 Electrolytes Lab Results  Component Value Date   NA 141 08/31/2017   K 3.5 08/31/2017  CL 101 08/31/2017   CALCIUM 9.8 08/31/2017   MG 1.8 08/31/2017                 Neuropathy Markers Lab Results  Component Value Date   VITAMINB12 416 08/31/2017   HGBA1C 5.1 05/28/2016                 Bone Pathology Markers Lab Results  Component Value Date   25OHVITD1 31 08/31/2017   25OHVITD2 <1.0 08/31/2017   25OHVITD3 31 08/31/2017                 Coagulation Parameters Lab Results  Component Value Date   INR 1.14 05/28/2016   LABPROT 14.8 05/28/2016   APTT 37 05/28/2016   PLT 78 (L) 06/03/2016                 Cardiovascular Markers Lab Results  Component Value Date   HGB 11.1 (L) 06/03/2016   HCT 35.1 (L) 06/03/2016                 CA Markers No results found for: CEA, CA125, LABCA2               Note: Lab results reviewed.  Recent Diagnostic Imaging Results  DG C-Arm 1-60 Min-No Report Fluoroscopy was utilized by the requesting physician.  No  radiographic  interpretation.   Complexity Note: Imaging results reviewed. Results shared with Mr. Klem, using Layman's terms.                         Meds   Current Outpatient Medications:  .  acetaminophen (TYLENOL) 325 MG tablet, Take 650 mg by mouth daily as needed for mild pain or headache. , Disp: , Rfl:  .  aspirin EC 81 MG tablet, Take 1 tablet (81 mg total) by mouth daily., Disp: 90 tablet, Rfl: 3 .  atorvastatin (LIPITOR) 80 MG tablet, TAKE 1 TABLET BY MOUTH  DAILY, Disp: 90 tablet, Rfl: 1 .  clopidogrel (PLAVIX) 75 MG tablet, TAKE 1 TABLET BY MOUTH  DAILY WITH BREAKFAST, Disp: 90 tablet, Rfl: 1 .  gabapentin (NEURONTIN) 100 MG capsule, Take 1-3 capsules (100-300 mg total) 4 (four) times daily by mouth. Follow written titration schedule., Disp: 360 capsule, Rfl: 2 .  hydrochlorothiazide (HYDRODIURIL) 25 MG tablet, Take 25 mg by mouth daily., Disp: , Rfl:  .  isosorbide dinitrate (ISORDIL) 10 MG tablet, Take 20 mg by mouth every morning. Take 10 mg by mouth in the afternoon, Disp: , Rfl:  .  lisinopril (PRINIVIL,ZESTRIL) 20 MG tablet, TAKE 1 TABLET BY MOUTH  DAILY, Disp: 90 tablet, Rfl: 1 .  Multiple Vitamins-Minerals (PRESERVISION AREDS PO), Take 1 tablet by mouth 2 (two) times daily., Disp: , Rfl:  .  nitroGLYCERIN (NITROSTAT) 0.4 MG SL tablet, Place 0.4 mg under the tongue every 5 (five) minutes as needed for chest pain., Disp: , Rfl:  .  pantoprazole (PROTONIX) 40 MG tablet, Take 20 mg by mouth daily., Disp: , Rfl:  .  tamsulosin (FLOMAX) 0.4 MG CAPS capsule, Take 1 capsule (0.4 mg total) by mouth daily., Disp: 30 capsule, Rfl: 3 .  [START ON 12/12/2017] traMADol (ULTRAM) 50 MG tablet, Take 1 tablet (50 mg total) daily by mouth., Disp: 90 tablet, Rfl: 0 .  TURMERIC PO, Take at bedtime by mouth., Disp: , Rfl:   ROS  Constitutional: Denies any fever or chills Gastrointestinal: No reported hemesis, hematochezia, vomiting, or acute GI distress  Musculoskeletal: Denies any acute  onset joint swelling, redness, loss of ROM, or weakness Neurological: No reported episodes of acute onset apraxia, aphasia, dysarthria, agnosia, amnesia, paralysis, loss of coordination, or loss of consciousness  Allergies  Mr. Frieden has No Known Allergies.  PFSH  Drug: Mr. Gadbois  reports that he does not use drugs. Alcohol:  reports that he does not drink alcohol. Tobacco:  reports that he quit smoking about 27 years ago. His smoking use included cigarettes. He has a 50.00 pack-year smoking history. he has never used smokeless tobacco. Medical:  has a past medical history of AS (aortic stenosis), CAD (coronary artery disease), Carotid bruit, GERD (gastroesophageal reflux disease), History of kidney stones, HOH (hard of hearing), HTN (hypertension), Hyperlipidemia, Myocardial infarction ([place]) (1996), Nephrolithiasis, OSA (obstructive sleep apnea), PAD (peripheral artery disease) ([place]) (02/2014), Shortness of breath, and Thrombocytopenia ([place]). Surgical: Mr. Geraghty  has a past surgical history that includes Stomach surgery; Coronary artery bypass graft (1991); Inguinal hernia repair (11/10/2011); left heart catheterization with coronary/graft angiogram (N/A, 03/06/2014); abdominal aortagram (N/A, 03/06/2014); Cardiac catheterization (02/2014); Cardiac catheterization (N/A, 07/31/2015); Cardiac catheterization (N/A, 07/31/2015); Cardiac catheterization (N/A, 05/26/2016); Cysto; Transcatheter aortic valve replacement, transfemoral (N/A, 06/01/2016); and TEE without cardioversion (N/A, 06/01/2016). Family: family history includes Heart disease in his father and mother.  Constitutional Exam  General appearance: Well nourished, well developed, and well hydrated. In no apparent acute distress There were no vitals filed for this visit. BMI Assessment: Estimated body mass index is 27.32 kg/m as calculated from the following:   Height as of 11/15/17: 5' 9" (1.753 m).   Weight as of 11/15/17: 185 lb (83.9 kg).  BMI  interpretation table: BMI level Category Range association with higher incidence of chronic pain  <18 kg/m2 Underweight   18.5-24.9 kg/m2 Ideal body weight   25-29.9 kg/m2 Overweight Increased incidence by 20%  30-34.9 kg/m2 Obese (Class I) Increased incidence by 68%  35-39.9 kg/m2 Severe obesity (Class II) Increased incidence by 136%  >40 kg/m2 Extreme obesity (Class III) Increased incidence by 254%   BMI Readings from Last 4 Encounters:  11/15/17 27.32 kg/m  10/26/17 27.32 kg/m  09/28/17 28.06 kg/m  08/31/17 28.06 kg/m   Wt Readings from Last 4 Encounters:  11/15/17 185 lb (83.9 kg)  10/26/17 185 lb (83.9 kg)  09/28/17 190 lb (86.2 kg)  08/31/17 190 lb (86.2 kg)  Psych/Mental status: Alert, oriented x 3 (person, place, & time)       Eyes: PERLA Respiratory: No evidence of acute respiratory distress  Cervical Spine Area Exam  Skin & Axial Inspection: No masses, redness, edema, swelling, or associated skin lesions Alignment: Symmetrical Functional ROM: Unrestricted ROM      Stability: No instability detected Muscle Tone/Strength: Functionally intact. No obvious neuro-muscular anomalies detected. Sensory (Neurological): Unimpaired Palpation: No palpable anomalies              Upper Extremity (UE) Exam    Side: Right upper extremity  Side: Left upper extremity  Skin & Extremity Inspection: Skin color, temperature, and hair growth are WNL. No peripheral edema or cyanosis. No masses, redness, swelling, asymmetry, or associated skin lesions. No contractures.  Skin & Extremity Inspection: Skin color, temperature, and hair growth are WNL. No peripheral edema or cyanosis. No masses, redness, swelling, asymmetry, or associated skin lesions. No contractures.  Functional ROM: Unrestricted ROM          Functional ROM: Unrestricted ROM          Muscle Tone/Strength: Functionally intact.  No obvious neuro-muscular anomalies detected.  Muscle Tone/Strength: Functionally intact. No obvious  neuro-muscular anomalies detected.  Sensory (Neurological): Unimpaired          Sensory (Neurological): Unimpaired          Palpation: No palpable anomalies              Palpation: No palpable anomalies              Specialized Test(s): Deferred         Specialized Test(s): Deferred          Thoracic Spine Area Exam  Skin & Axial Inspection: No masses, redness, or swelling Alignment: Symmetrical Functional ROM: Unrestricted ROM Stability: No instability detected Muscle Tone/Strength: Functionally intact. No obvious neuro-muscular anomalies detected. Sensory (Neurological): Unimpaired Muscle strength & Tone: No palpable anomalies  Lumbar Spine Area Exam  Skin & Axial Inspection: No masses, redness, or swelling Alignment: Symmetrical Functional ROM: Unrestricted ROM      Stability: No instability detected Muscle Tone/Strength: Functionally intact. No obvious neuro-muscular anomalies detected. Sensory (Neurological): Unimpaired Palpation: No palpable anomalies       Provocative Tests: Lumbar Hyperextension and rotation test: evaluation deferred today       Lumbar Lateral bending test: evaluation deferred today       Patrick's Maneuver: evaluation deferred today                    Gait & Posture Assessment  Ambulation: Unassisted Gait: Relatively normal for age and body habitus Posture: WNL   Lower Extremity Exam    Side: Right lower extremity  Side: Left lower extremity  Skin & Extremity Inspection: Skin color, temperature, and hair growth are WNL. No peripheral edema or cyanosis. No masses, redness, swelling, asymmetry, or associated skin lesions. No contractures.  Skin & Extremity Inspection: Skin color, temperature, and hair growth are WNL. No peripheral edema or cyanosis. No masses, redness, swelling, asymmetry, or associated skin lesions. No contractures.  Functional ROM: Unrestricted ROM          Functional ROM: Unrestricted ROM          Muscle Tone/Strength: Functionally  intact. No obvious neuro-muscular anomalies detected.  Muscle Tone/Strength: Functionally intact. No obvious neuro-muscular anomalies detected.  Sensory (Neurological): Unimpaired  Sensory (Neurological): Unimpaired  Palpation: No palpable anomalies  Palpation: No palpable anomalies   Assessment  Primary Diagnosis & Pertinent Problem List: The primary encounter diagnosis was Chronic low back pain (Primary Area of Pain) (Bilateral) (L>R). Diagnoses of Lumbar facet syndrome (Bilateral) (L>R), Chronic sacroiliac joint pain (Bilateral) (L>R), and Chronic pain syndrome were also pertinent to this visit.  Status Diagnosis  Controlled Controlled Controlled 1. Chronic low back pain (Primary Area of Pain) (Bilateral) (L>R)   2. Lumbar facet syndrome (Bilateral) (L>R)   3. Chronic sacroiliac joint pain (Bilateral) (L>R)   4. Chronic pain syndrome     Problems updated and reviewed during this visit: Problem  Long Term Current Use of Opiate Analgesic  Long Term Prescription Opiate Use  Opiate Use   Plan of Care  Pharmacotherapy (Medications Ordered): No orders of the defined types were placed in this encounter.  Medications administered today: Nehemiah Settle. Bolz had no medications administered during this visit.  Procedure Orders    No procedure(s) ordered today   Lab Orders  No laboratory test(s) ordered today   Imaging Orders  No imaging studies ordered today   Referral Orders  No referral(s) requested today    Interventional management options:  Planned, scheduled, and/or pending:   NOTE:Stop Plavix for 7 days for procedures. Diagnostic bilateral lumbar facet block + diagnostic bilateral sacroiliac joint block #2 under fluoroscopic guidance and IV sedation    Considering:   Diagnosticbilateral Lumbar facet block Possiblebilateral lumbar facet RFA Diagnosticbilateral SI joint injection Possible bilateral SI joint RFA Diagnostic bilateral hip injections   Palliative  PRN treatment(s):   None at this time.   Provider-requested follow-up: No Follow-up on file.  Future Appointments  Date Time Provider [place]  11/30/2017  2:00 PM Milinda Pointer, MD [facility] None   Primary Care Physician: Vesta Mixer Location: [facility] Note by: Gaspar Cola, MD Date: 11/30/2017; Time: 8:21 AM

## 2017-12-09 ENCOUNTER — Ambulatory Visit
Admission: RE | Admit: 2017-12-09 | Discharge: 2017-12-09 | Disposition: A | Discharge status: Home / Self Care | Payer: Medicare Other | Source: Ambulatory Visit | Attending: Pain Medicine | Admitting: Pain Medicine

## 2017-12-09 DIAGNOSIS — M5116 Intervertebral disc disorders with radiculopathy, lumbar region: Secondary | ICD-10-CM | POA: Diagnosis not present

## 2017-12-09 DIAGNOSIS — M5126 Other intervertebral disc displacement, lumbar region: Secondary | ICD-10-CM | POA: Diagnosis not present

## 2017-12-09 DIAGNOSIS — M5410 Radiculopathy, site unspecified: Secondary | ICD-10-CM

## 2017-12-09 DIAGNOSIS — M79605 Pain in left leg: Secondary | ICD-10-CM | POA: Diagnosis present

## 2017-12-09 DIAGNOSIS — M79604 Pain in right leg: Secondary | ICD-10-CM

## 2017-12-09 DIAGNOSIS — M48061 Spinal stenosis, lumbar region without neurogenic claudication: Secondary | ICD-10-CM | POA: Diagnosis not present

## 2017-12-09 DIAGNOSIS — G8929 Other chronic pain: Secondary | ICD-10-CM | POA: Diagnosis present

## 2017-12-11 NOTE — Progress Notes (Signed)
Results were reviewed and found to be: abnormal  Surgical consultation is recommended  Review would suggest interventional pain management techniques may be of benefit

## 2017-12-26 ENCOUNTER — Encounter: Payer: Self-pay | Admitting: Pain Medicine

## 2017-12-26 ENCOUNTER — Ambulatory Visit: Payer: Medicare HMO | Attending: Pain Medicine | Admitting: Pain Medicine

## 2017-12-26 ENCOUNTER — Other Ambulatory Visit: Payer: Self-pay

## 2017-12-26 VITALS — BP 138/61 | HR 68 | Temp 98.0°F | Resp 18 | Ht 69.0 in | Wt 185.0 lb

## 2017-12-26 DIAGNOSIS — I350 Nonrheumatic aortic (valve) stenosis: Secondary | ICD-10-CM | POA: Diagnosis not present

## 2017-12-26 DIAGNOSIS — I2510 Atherosclerotic heart disease of native coronary artery without angina pectoris: Secondary | ICD-10-CM | POA: Insufficient documentation

## 2017-12-26 DIAGNOSIS — G894 Chronic pain syndrome: Secondary | ICD-10-CM | POA: Insufficient documentation

## 2017-12-26 DIAGNOSIS — G8929 Other chronic pain: Secondary | ICD-10-CM | POA: Diagnosis not present

## 2017-12-26 DIAGNOSIS — M5442 Lumbago with sciatica, left side: Secondary | ICD-10-CM | POA: Diagnosis not present

## 2017-12-26 DIAGNOSIS — D696 Thrombocytopenia, unspecified: Secondary | ICD-10-CM | POA: Insufficient documentation

## 2017-12-26 DIAGNOSIS — E785 Hyperlipidemia, unspecified: Secondary | ICD-10-CM | POA: Insufficient documentation

## 2017-12-26 DIAGNOSIS — Z7982 Long term (current) use of aspirin: Secondary | ICD-10-CM | POA: Insufficient documentation

## 2017-12-26 DIAGNOSIS — Z79891 Long term (current) use of opiate analgesic: Secondary | ICD-10-CM | POA: Insufficient documentation

## 2017-12-26 DIAGNOSIS — M79604 Pain in right leg: Secondary | ICD-10-CM

## 2017-12-26 DIAGNOSIS — M79605 Pain in left leg: Secondary | ICD-10-CM

## 2017-12-26 DIAGNOSIS — R937 Abnormal findings on diagnostic imaging of other parts of musculoskeletal system: Secondary | ICD-10-CM | POA: Diagnosis not present

## 2017-12-26 DIAGNOSIS — I252 Old myocardial infarction: Secondary | ICD-10-CM | POA: Insufficient documentation

## 2017-12-26 DIAGNOSIS — M5116 Intervertebral disc disorders with radiculopathy, lumbar region: Secondary | ICD-10-CM | POA: Diagnosis not present

## 2017-12-26 DIAGNOSIS — M5416 Radiculopathy, lumbar region: Secondary | ICD-10-CM | POA: Diagnosis not present

## 2017-12-26 DIAGNOSIS — Z87891 Personal history of nicotine dependence: Secondary | ICD-10-CM | POA: Insufficient documentation

## 2017-12-26 DIAGNOSIS — K219 Gastro-esophageal reflux disease without esophagitis: Secondary | ICD-10-CM | POA: Insufficient documentation

## 2017-12-26 DIAGNOSIS — Z79899 Other long term (current) drug therapy: Secondary | ICD-10-CM | POA: Diagnosis not present

## 2017-12-26 DIAGNOSIS — M5410 Radiculopathy, site unspecified: Secondary | ICD-10-CM

## 2017-12-26 DIAGNOSIS — M5441 Lumbago with sciatica, right side: Secondary | ICD-10-CM

## 2017-12-26 DIAGNOSIS — M48062 Spinal stenosis, lumbar region with neurogenic claudication: Secondary | ICD-10-CM | POA: Diagnosis not present

## 2017-12-26 DIAGNOSIS — G4733 Obstructive sleep apnea (adult) (pediatric): Secondary | ICD-10-CM | POA: Diagnosis not present

## 2017-12-26 DIAGNOSIS — Z7901 Long term (current) use of anticoagulants: Secondary | ICD-10-CM | POA: Diagnosis not present

## 2017-12-26 DIAGNOSIS — Z952 Presence of prosthetic heart valve: Secondary | ICD-10-CM | POA: Insufficient documentation

## 2017-12-26 DIAGNOSIS — I739 Peripheral vascular disease, unspecified: Secondary | ICD-10-CM | POA: Insufficient documentation

## 2017-12-26 DIAGNOSIS — I10 Essential (primary) hypertension: Secondary | ICD-10-CM | POA: Diagnosis not present

## 2017-12-26 DIAGNOSIS — M48061 Spinal stenosis, lumbar region without neurogenic claudication: Secondary | ICD-10-CM | POA: Diagnosis not present

## 2017-12-26 NOTE — Progress Notes (Signed)
Patient's Name: Matthew [name]  MRN: [id number]  Referring Provider: Vesta Mixer  DOB: 1936-08-16  PCP: Antionette Char, MD  DOS: 12/26/2017  Note by: Gaspar Cola, MD  Service setting: Ambulatory outpatient  Specialty: Interventional Pain Management  Location: [facility] (AMB) Pain Management Facility    Patient type: Established   Primary Reason(s) for Visit: Encounter for post-procedure evaluation of chronic illness with mild to moderate exacerbation CC: Back Pain (low)  HPI  Matthew [name] is a 82 y.o. year old, male patient, who comes today for a post-procedure evaluation. He has HLD (hyperlipidemia); Obstructive sleep apnea; Essential hypertension; Coronary atherosclerosis; Aortic valve Replacement; Angina pectoris ([place]); PAD (peripheral artery disease) ([place]); Effort angina ([place]); Coronary artery disease; Thrombocytopenia ([place]); Severe aortic stenosis; Long term current use of opiate analgesic; Long term prescription opiate use; Opiate use; Chronic low back pain (Primary Area of Pain) (Bilateral) (L>R); Chronic lower extremity pain (Bilateral) (L>R); Chronic pain syndrome; Chronic sacroiliac joint pain (Bilateral) (L>R); Chronic hip pain; Long term current use of anticoagulant (Plavix); History of MI (myocardial infarction) (Last 2004); History of alcoholism ([place]); Neurogenic pain; Lumbar facet syndrome (Bilateral) (L>R); DDD (degenerative disc disease), lumbar; Lumbar facet osteoarthritis; Lumbar spondylosis; Gastroesophageal reflux disease without esophagitis; Chronic lower extremity radicular pain (S1 dermatome) (Bilateral) (L>R); Lumbar spinal stenosis; Abnormal MRI, lumbar spine; and Chronic lumbar radiculopathy on their problem list. His primarily concern today is the Back Pain (low)  Pain Assessment: Location: Lower Back Radiating: buttocks and legs Onset: More than a month ago Duration: Chronic pain Quality: Aching Severity: 2 /10 (self-reported pain score)  Note: Reported level is  compatible with observation.                         When using our objective Pain Scale, levels between 6 and 10/10 are said to belong in an emergency room, as it progressively worsens from a 6/10, described as severely limiting, requiring emergency care not usually available at an outpatient pain management facility. At a 6/10 level, communication becomes difficult and requires great effort. Assistance to reach the emergency department may be required. Facial flushing and profuse sweating along with potentially dangerous increases in heart rate and blood pressure will be evident. Timing: Intermittent Modifying factors: procedures, hea,t rest  Matthew [name] comes in today for post-procedure evaluation after the treatment done on 11/15/2017.  The patient was scheduled to return on 11/30/2017 for follow-up evaluation but did not keep his appointment.  Further details on both, my assessment(s), as well as the proposed treatment plan, please see below.  Post-Procedure Assessment  11/15/2017 Procedure: Diagnostic bilateral lumbar facet block + diagnostic bilateral sacroiliac joint block #1 under fluoroscopic guidance and IV sedation Pre-procedure pain score:  7/10 Post-procedure pain score: 0/10 (100% relief) Influential Factors: BMI: 27.32 kg/m Intra-procedural challenges: None observed.         Assessment challenges: Results reported today are inconsistent with those reported on procedure day.        Before discharge, the patient had previously reported 100% relief of the pain. Reported side-effects: None.        Post-procedural adverse reactions or complications: None reported         Sedation: Sedation provided. When no sedatives are used, the analgesic levels obtained are directly associated to the effectiveness of the local anesthetics. However, when sedation is provided, the level of analgesia obtained during the initial 1 hour following the intervention, is believed to be the result  of a combination  of factors. These factors may include, but are not limited to: 1. The effectiveness of the local anesthetics used. 2. The effects of the analgesic(s) and/or anxiolytic(s) used. 3. The degree of discomfort experienced by the patient at the time of the procedure. 4. The patients ability and reliability in recalling and recording the events. 5. The presence and influence of possible secondary gains and/or psychosocial factors. Reported result: Relief experienced during the 1st hour after the procedure: 50 % (Ultra-Short Term Relief)            Interpretative annotation: Unreliable result. Analgesia during this period is likely to be Local Anesthetic and/or IV Sedative (Analgesic/Anxiolytic) related.          Effects of local anesthetic: The analgesic effects attained during this period are directly associated to the localized infiltration of local anesthetics and therefore cary significant diagnostic value as to the etiological location, or anatomical origin, of the pain. Expected duration of relief is directly dependent on the pharmacodynamics of the local anesthetic used. Long-acting (4-6 hours) anesthetics used.  Reported result: Relief during the next 4 to 6 hour after the procedure: 50 % (Short-Term Relief)            Interpretative annotation: Clinically appropriate result. Analgesia during this period is likely to be Local Anesthetic-related.          Long-term benefit: Defined as the period of time past the expected duration of local anesthetics (1 hour for short-acting and 4-6 hours for long-acting). With the possible exception of prolonged sympathetic blockade from the local anesthetics, benefits during this period are typically attributed to, or associated with, other factors such as analgesic sensory neuropraxia, antiinflammatory effects, or beneficial biochemical changes provided by agents other than the local anesthetics.  Reported result: Extended relief following procedure: 50 % (Long-Term  Relief)            Interpretative annotation: Clinically appropriate result. Good relief. No permanent benefit expected. Inflammation plays a part in the etiology to the pain.          Current benefits: Defined as reported results that persistent at this point in time.   Analgesia: <25 %            Function: Somewhat improved ROM: Somewhat improved Interpretative annotation: Recurrence of symptoms. No permanent benefit expected. Effective diagnostic intervention.          Interpretation: Results would suggest a successful diagnostic intervention.                  Plan:  Please see "Plan of Care" for details.        Laboratory Chemistry  Inflammation Markers (CRP: Acute Phase) (ESR: Chronic Phase) Lab Results  Component Value Date   CRP 0.4 08/31/2017   ESRSEDRATE 3 08/31/2017                 Renal Function Markers Lab Results  Component Value Date   BUN 17 08/31/2017   CREATININE 1.03 08/31/2017   GFRAA 80 08/31/2017   GFRNONAA 69 08/31/2017                 Hepatic Function Markers Lab Results  Component Value Date   AST 19 08/31/2017   ALT 13 (L) 05/28/2016   ALBUMIN 4.6 08/31/2017   ALKPHOS 60 08/31/2017                 Electrolytes Lab Results  Component Value Date   NA 141  08/31/2017   K 3.5 08/31/2017   CL 101 08/31/2017   CALCIUM 9.8 08/31/2017   MG 1.8 08/31/2017                 Neuropathy Markers Lab Results  Component Value Date   VITAMINB12 416 08/31/2017   HGBA1C 5.1 05/28/2016                 Bone Pathology Markers Lab Results  Component Value Date   25OHVITD1 31 08/31/2017   25OHVITD2 <1.0 08/31/2017   25OHVITD3 31 08/31/2017                 Coagulation Parameters Lab Results  Component Value Date   INR 1.14 05/28/2016   LABPROT 14.8 05/28/2016   APTT 37 05/28/2016   PLT 78 (L) 06/03/2016                 Cardiovascular Markers Lab Results  Component Value Date   HGB 11.1 (L) 06/03/2016   HCT 35.1 (L) 06/03/2016                  Note: Lab results reviewed.  Recent Diagnostic Imaging Results  MR LUMBAR SPINE WO CONTRAST CLINICAL DATA:  Chronic low back pain with radiculopathy.  EXAM: MRI LUMBAR SPINE WITHOUT CONTRAST  TECHNIQUE: Multiplanar, multisequence MR imaging of the lumbar spine was performed. No intravenous contrast was administered.  COMPARISON:  Lumbar radiographs 08/31/2017  FINDINGS: Segmentation:  Normal  Alignment:  Normal  Vertebrae:  Normal  Conus medullaris and cauda equina: Conus extends to the L1-2 level. Conus and cauda equina appear normal.  Paraspinal and other soft tissues: Negative  Disc levels:  L1-2:  Mild disc degeneration without stenosis  L2-3: Disc degeneration with disc bulging left greater than right. Subarticular stenosis on the left with impingement of the left L3 nerve root. Mild spinal stenosis.  L3-4: Diffuse disc bulging and spurring causing moderate spinal stenosis. Bilateral facet hypertrophy. Subarticular and foraminal stenosis bilaterally  L4-5: Moderately large right paracentral disc protrusion extending into the foramen. This is causing severe spinal stenosis and compression of the thecal sac. There is also compression of the right L4 and L5 nerve roots. Moderate subarticular and foraminal stenosis also present on the left.  L5-S1:  Negative  IMPRESSION: Were mild spinal stenosis L2-3. Subarticular stenosis on the left with impingement left L3 nerve root  Moderate spinal stenosis L3-4 with subarticular and foraminal stenosis bilaterally  Moderately large right paracentral disc protrusion L4-5 with severe spinal stenosis and marked compression right L4 and L5 nerve roots. Moderate subarticular foraminal stenosis on the left.  Electronically Signed   By: Franchot Gallo M.D.   On: 12/09/2017 20:26  Complexity Note: Imaging results reviewed. Results shared with Matthew [name], using Layman's terms.                         Meds   Current  Outpatient Medications:  .  acetaminophen (TYLENOL) 325 MG tablet, Take 650 mg by mouth daily as needed for mild pain or headache. , Disp: , Rfl:  .  aspirin EC 81 MG tablet, Take 1 tablet (81 mg total) by mouth daily., Disp: 90 tablet, Rfl: 3 .  atorvastatin (LIPITOR) 80 MG tablet, TAKE 1 TABLET BY MOUTH  DAILY, Disp: 90 tablet, Rfl: 1 .  clopidogrel (PLAVIX) 75 MG tablet, TAKE 1 TABLET BY MOUTH  DAILY WITH BREAKFAST, Disp: 90 tablet, Rfl: 1 .  gabapentin (NEURONTIN)  100 MG capsule, Take 1-3 capsules (100-300 mg total) 4 (four) times daily by mouth. Follow written titration schedule., Disp: 360 capsule, Rfl: 2 .  hydrochlorothiazide (HYDRODIURIL) 25 MG tablet, Take 25 mg by mouth daily., Disp: , Rfl:  .  isosorbide dinitrate (ISORDIL) 10 MG tablet, Take 20 mg by mouth every morning. Take 10 mg by mouth in the afternoon, Disp: , Rfl:  .  lisinopril (PRINIVIL,ZESTRIL) 20 MG tablet, TAKE 1 TABLET BY MOUTH  DAILY, Disp: 90 tablet, Rfl: 1 .  Multiple Vitamins-Minerals (PRESERVISION AREDS PO), Take 1 tablet by mouth 2 (two) times daily., Disp: , Rfl:  .  nitroGLYCERIN (NITROSTAT) 0.4 MG SL tablet, Place 0.4 mg under the tongue every 5 (five) minutes as needed for chest pain., Disp: , Rfl:  .  pantoprazole (PROTONIX) 40 MG tablet, Take 20 mg by mouth daily., Disp: , Rfl:  .  tamsulosin (FLOMAX) 0.4 MG CAPS capsule, Take 1 capsule (0.4 mg total) by mouth daily., Disp: 30 capsule, Rfl: 3 .  traMADol (ULTRAM) 50 MG tablet, Take 1 tablet (50 mg total) daily by mouth., Disp: 90 tablet, Rfl: 0 .  TURMERIC PO, Take at bedtime by mouth., Disp: , Rfl:   ROS  Constitutional: Denies any fever or chills Gastrointestinal: No reported hemesis, hematochezia, vomiting, or acute GI distress Musculoskeletal: Denies any acute onset joint swelling, redness, loss of ROM, or weakness Neurological: No reported episodes of acute onset apraxia, aphasia, dysarthria, agnosia, amnesia, paralysis, loss of coordination, or loss of  consciousness  Allergies  Matthew [name] has No Known Allergies.  PFSH  Drug: Matthew [name]  reports that he does not use drugs. Alcohol:  reports that he does not drink alcohol. Tobacco:  reports that he quit smoking about 27 years ago. His smoking use included cigarettes. He has a 50.00 pack-year smoking history. he has never used smokeless tobacco. Medical:  has a past medical history of AS (aortic stenosis), CAD (coronary artery disease), Carotid bruit, GERD (gastroesophageal reflux disease), History of kidney stones, HOH (hard of hearing), HTN (hypertension), Hyperlipidemia, Myocardial infarction ([place]) (1996), Nephrolithiasis, OSA (obstructive sleep apnea), PAD (peripheral artery disease) ([place]) (02/2014), Shortness of breath, and Thrombocytopenia ([place]). Surgical: Matthew [name]  has a past surgical history that includes Stomach surgery; Coronary artery bypass graft (1991); Inguinal hernia repair (11/10/2011); left heart catheterization with coronary/graft angiogram (N/A, 03/06/2014); abdominal aortagram (N/A, 03/06/2014); Cardiac catheterization (02/2014); Cardiac catheterization (N/A, 07/31/2015); Cardiac catheterization (N/A, 07/31/2015); Cardiac catheterization (N/A, 05/26/2016); Cysto; Transcatheter aortic valve replacement, transfemoral (N/A, 06/01/2016); and TEE without cardioversion (N/A, 06/01/2016). Family: family history includes Heart disease in his father and mother.  Constitutional Exam  General appearance: Well nourished, well developed, and well hydrated. In no apparent acute distress Vitals:   12/26/17 1445  BP: 138/61  Pulse: 68  Resp: 18  Temp: 98 F (36.7 C)  TempSrc: Oral  SpO2: 97%  Weight: 185 lb (83.9 kg)  Height: _0  (1.753 m)   BMI Assessment: Estimated body mass index is 27.32 kg/m as calculated from the following:   Height as of this encounter: _1  (1.753 m).   Weight as of this encounter: 185 lb (83.9 kg).  BMI interpretation table: BMI level Category Range association  with higher incidence of chronic pain  <18 kg/m2 Underweight   18.5-24.9 kg/m2 Ideal body weight   25-29.9 kg/m2 Overweight Increased incidence by 20%  30-34.9 kg/m2 Obese (Class I) Increased incidence by 68%  35-39.9 kg/m2 Severe obesity (Class II) Increased incidence by 136%  >  40 kg/m2 Extreme obesity (Class III) Increased incidence by 254%   BMI Readings from Last 4 Encounters:  12/26/17 27.32 kg/m  11/15/17 27.32 kg/m  10/26/17 27.32 kg/m  09/28/17 28.06 kg/m   Wt Readings from Last 4 Encounters:  12/26/17 185 lb (83.9 kg)  11/15/17 185 lb (83.9 kg)  10/26/17 185 lb (83.9 kg)  09/28/17 190 lb (86.2 kg)  Psych/Mental status: Alert, oriented x 3 (person, place, & time)       Eyes: PERLA Respiratory: No evidence of acute respiratory distress  Cervical Spine Area Exam  Skin & Axial Inspection: No masses, redness, edema, swelling, or associated skin lesions Alignment: Symmetrical Functional ROM: Unrestricted ROM      Stability: No instability detected Muscle Tone/Strength: Functionally intact. No obvious neuro-muscular anomalies detected. Sensory (Neurological): Unimpaired Palpation: No palpable anomalies              Upper Extremity (UE) Exam    Side: Right upper extremity  Side: Left upper extremity  Skin & Extremity Inspection: Skin color, temperature, and hair growth are WNL. No peripheral edema or cyanosis. No masses, redness, swelling, asymmetry, or associated skin lesions. No contractures.  Skin & Extremity Inspection: Skin color, temperature, and hair growth are WNL. No peripheral edema or cyanosis. No masses, redness, swelling, asymmetry, or associated skin lesions. No contractures.  Functional ROM: Unrestricted ROM          Functional ROM: Unrestricted ROM          Muscle Tone/Strength: Functionally intact. No obvious neuro-muscular anomalies detected.  Muscle Tone/Strength: Functionally intact. No obvious neuro-muscular anomalies detected.  Sensory (Neurological):  Unimpaired          Sensory (Neurological): Unimpaired          Palpation: No palpable anomalies              Palpation: No palpable anomalies              Specialized Test(s): Deferred         Specialized Test(s): Deferred          Thoracic Spine Area Exam  Skin & Axial Inspection: No masses, redness, or swelling Alignment: Symmetrical Functional ROM: Unrestricted ROM Stability: No instability detected Muscle Tone/Strength: Functionally intact. No obvious neuro-muscular anomalies detected. Sensory (Neurological): Unimpaired Muscle strength & Tone: No palpable anomalies  Lumbar Spine Area Exam  Skin & Axial Inspection: No masses, redness, or swelling Alignment: Symmetrical Functional ROM: Unrestricted ROM      Stability: No instability detected Muscle Tone/Strength: Functionally intact. No obvious neuro-muscular anomalies detected. Sensory (Neurological): Unimpaired Palpation: No palpable anomalies       Provocative Tests: Lumbar Hyperextension and rotation test: evaluation deferred today       Lumbar Lateral bending test: evaluation deferred today       Patrick's Maneuver: evaluation deferred today                    Gait & Posture Assessment  Ambulation: Unassisted Gait: Relatively normal for age and body habitus Posture: WNL   Lower Extremity Exam    Side: Right lower extremity  Side: Left lower extremity  Skin & Extremity Inspection: Skin color, temperature, and hair growth are WNL. No peripheral edema or cyanosis. No masses, redness, swelling, asymmetry, or associated skin lesions. No contractures.  Skin & Extremity Inspection: Skin color, temperature, and hair growth are WNL. No peripheral edema or cyanosis. No masses, redness, swelling, asymmetry, or associated skin lesions. No contractures.  Functional  ROM: Unrestricted ROM          Functional ROM: Unrestricted ROM          Muscle Tone/Strength: Functionally intact. No obvious neuro-muscular anomalies detected.  Muscle  Tone/Strength: Functionally intact. No obvious neuro-muscular anomalies detected.  Sensory (Neurological): Unimpaired  Sensory (Neurological): Unimpaired  Palpation: No palpable anomalies  Palpation: No palpable anomalies   Assessment  Primary Diagnosis & Pertinent Problem List: The primary encounter diagnosis was Chronic low back pain (Primary Area of Pain) (Bilateral) (L>R). Diagnoses of Chronic lumbar radiculopathy, Chronic lower extremity radicular pain (S1 dermatome) (Bilateral) (L>R), Chronic lower extremity pain (Bilateral) (L>R), Lumbar spinal stenosis, Abnormal MRI, lumbar spine, and Long term current use of anticoagulant (Plavix) were also pertinent to this visit.  Status Diagnosis  Controlled Controlled Controlled 1. Chronic low back pain (Primary Area of Pain) (Bilateral) (L>R)   2. Chronic lumbar radiculopathy   3. Chronic lower extremity radicular pain (S1 dermatome) (Bilateral) (L>R)   4. Chronic lower extremity pain (Bilateral) (L>R)   5. Lumbar spinal stenosis   6. Abnormal MRI, lumbar spine   7. Long term current use of anticoagulant (Plavix)     Problems updated and reviewed during this visit: No problems updated. Plan of Care  Pharmacotherapy (Medications Ordered): No orders of the defined types were placed in this encounter.  Medications administered today: Matthew [name] had no medications administered during this visit.   Procedure Orders     Lumbar Epidural Injection Lab Orders  No laboratory test(s) ordered today   Imaging Orders  No imaging studies ordered today    Referral Orders     Ambulatory referral to Neurosurgery  Interventional management options: Planned, scheduled, and/or pending:   NOTE:Stop Plavix for 7 days for procedures. Diagnostic left-sided L4-5 interlaminar lumbar epidural steroid injection under fluoroscopic guidance, no sedation.   Considering:   Diagnostic bilateral lumbar facet block + bilateral sacroiliac joint block #2   Possible bilateral lumbar facet RFA + bilateral sacroiliac joint RFA  Diagnostic left-sided L4-5 interlaminar lumbar epidural steroid injection  Diagnostic left-sided L2-3 transforaminal epidural steroid injection  Diagnostic bilateral L3-4 transforaminal epidural steroid injections  Diagnostic bilateral hip injections   Palliative PRN treatment(s):   None at this time   Provider-requested follow-up: Return for Procedure (no sedation): (L) L4-5 LESI, (Blood-thinner Protocol).  No future appointments. Primary Care Physician: Antionette Char, MD Location: [facility] Note by: Gaspar Cola, MD Date: 12/26/2017; Time: 3:51 PM

## 2017-12-26 NOTE — Progress Notes (Signed)
Safety precautions to be maintained throughout the outpatient stay will include: orient to surroundings, keep bed in low position, maintain call bell within reach at all times, provide assistance with transfer out of bed and ambulation.  

## 2017-12-26 NOTE — Patient Instructions (Addendum)
____________________________________________________________________________________________  Preparing for your procedure (without sedation) Instructions: . Oral Intake: Do not eat or drink anything for at least 3 hours prior to your procedure. . Transportation: Unless otherwise stated by your physician, you may drive yourself after the procedure. . Blood Pressure Medicine: Take your blood pressure medicine with a sip of water the morning of the procedure. . Blood thinners:  . Diabetics on insulin: Notify the staff so that you can be scheduled 1st case in the morning. If your diabetes requires high dose insulin, take only  of your normal insulin dose the morning of the procedure and notify the staff that you have done so. . Preventing infections: Shower with an antibacterial soap the morning of your procedure.  . Build-up your immune system: Take 1000 mg of Vitamin C with every meal (3 times a day) the day prior to your procedure. . Antibiotics: Inform the staff if you have a condition or reason that requires you to take antibiotics before dental procedures. . Pregnancy: If you are pregnant, call and cancel the procedure. . Sickness: If you have a cold, fever, or any active infections, call and cancel the procedure. . Arrival: You must be in the facility at least 30 minutes prior to your scheduled procedure. . Children: Do not bring any children with you. . Dress appropriately: Bring dark clothing that you would not mind if they get stained. . Valuables: Do not bring any jewelry or valuables. Procedure appointments are reserved for interventional treatments only. . No Prescription Refills. . No medication changes will be discussed during procedure appointments. . No disability issues will be discussed. ____________________________________________________________________________________________   

## 2018-01-02 ENCOUNTER — Telehealth: Payer: Self-pay

## 2018-01-02 NOTE — Telephone Encounter (Signed)
Patient received clearance in October of last year. I have checked with Dr. Dossie Arbour and ok to stop PLAVIX for 7 days as long as there has been no change in his medical condition. Please advise patient when you schedule him. Thank you- Anderson Malta

## 2018-01-02 NOTE — Telephone Encounter (Signed)
I received authorization for the patient to have his procedure. He will need to stop his plavix for 7 days. Does he need permission from his doctor to stop, or have you already requested that from his doctor? Please advise, so I will know how to schedule him. Thanks

## 2018-01-11 ENCOUNTER — Ambulatory Visit: Payer: Medicare HMO | Admitting: Student in an Organized Health Care Education/Training Program

## 2018-01-12 ENCOUNTER — Ambulatory Visit (HOSPITAL_BASED_OUTPATIENT_CLINIC_OR_DEPARTMENT_OTHER): Payer: Medicare HMO | Admitting: Pain Medicine

## 2018-01-12 ENCOUNTER — Ambulatory Visit
Admission: RE | Admit: 2018-01-12 | Discharge: 2018-01-12 | Disposition: A | Discharge status: Home / Self Care | Payer: Medicare HMO | Source: Ambulatory Visit | Attending: Pain Medicine | Admitting: Pain Medicine

## 2018-01-12 ENCOUNTER — Encounter: Payer: Self-pay | Admitting: Pain Medicine

## 2018-01-12 ENCOUNTER — Other Ambulatory Visit: Payer: Self-pay

## 2018-01-12 VITALS — BP 134/63 | HR 69 | Temp 97.9°F | Resp 17 | Ht 69.0 in | Wt 195.0 lb

## 2018-01-12 DIAGNOSIS — M79604 Pain in right leg: Secondary | ICD-10-CM

## 2018-01-12 DIAGNOSIS — M5136 Other intervertebral disc degeneration, lumbar region: Secondary | ICD-10-CM | POA: Insufficient documentation

## 2018-01-12 DIAGNOSIS — Z79899 Other long term (current) drug therapy: Secondary | ICD-10-CM | POA: Diagnosis not present

## 2018-01-12 DIAGNOSIS — M5410 Radiculopathy, site unspecified: Secondary | ICD-10-CM

## 2018-01-12 DIAGNOSIS — Z7902 Long term (current) use of antithrombotics/antiplatelets: Secondary | ICD-10-CM | POA: Diagnosis not present

## 2018-01-12 DIAGNOSIS — Z7982 Long term (current) use of aspirin: Secondary | ICD-10-CM | POA: Diagnosis not present

## 2018-01-12 DIAGNOSIS — M5116 Intervertebral disc disorders with radiculopathy, lumbar region: Secondary | ICD-10-CM | POA: Insufficient documentation

## 2018-01-12 DIAGNOSIS — R937 Abnormal findings on diagnostic imaging of other parts of musculoskeletal system: Secondary | ICD-10-CM

## 2018-01-12 DIAGNOSIS — M48061 Spinal stenosis, lumbar region without neurogenic claudication: Secondary | ICD-10-CM | POA: Insufficient documentation

## 2018-01-12 DIAGNOSIS — M79605 Pain in left leg: Secondary | ICD-10-CM | POA: Insufficient documentation

## 2018-01-12 DIAGNOSIS — G8929 Other chronic pain: Secondary | ICD-10-CM | POA: Diagnosis not present

## 2018-01-12 DIAGNOSIS — M5416 Radiculopathy, lumbar region: Secondary | ICD-10-CM

## 2018-01-12 DIAGNOSIS — M5442 Lumbago with sciatica, left side: Secondary | ICD-10-CM

## 2018-01-12 DIAGNOSIS — M48062 Spinal stenosis, lumbar region with neurogenic claudication: Secondary | ICD-10-CM

## 2018-01-12 DIAGNOSIS — M51369 Other intervertebral disc degeneration, lumbar region without mention of lumbar back pain or lower extremity pain: Secondary | ICD-10-CM

## 2018-01-12 DIAGNOSIS — Z7901 Long term (current) use of anticoagulants: Secondary | ICD-10-CM

## 2018-01-12 DIAGNOSIS — M5441 Lumbago with sciatica, right side: Secondary | ICD-10-CM

## 2018-01-12 MED ORDER — SODIUM CHLORIDE 0.9 % IJ SOLN
INTRAMUSCULAR | Status: AC
  Filled 2018-01-12: qty 10

## 2018-01-12 MED ORDER — LIDOCAINE HCL 2 % IJ SOLN
10.0000 mL | Freq: Once | INTRAMUSCULAR | Status: AC
  Administered 2018-01-12: 400 mg
  Filled 2018-01-12: qty 20

## 2018-01-12 MED ORDER — FENTANYL CITRATE (PF) 100 MCG/2ML IJ SOLN: 25.0000 ug | INTRAMUSCULAR | Status: DC | PRN

## 2018-01-12 MED ORDER — MIDAZOLAM HCL 5 MG/5ML IJ SOLN: 1.0000 mg | INTRAMUSCULAR | Status: DC | PRN

## 2018-01-12 MED ORDER — TRIAMCINOLONE ACETONIDE 40 MG/ML IJ SUSP
40.0000 mg | Freq: Once | INTRAMUSCULAR | Status: AC
  Administered 2018-01-12: 40 mg
  Filled 2018-01-12: qty 1

## 2018-01-12 MED ORDER — SODIUM CHLORIDE 0.9% FLUSH
2.0000 mL | Freq: Once | INTRAVENOUS | Status: AC
  Administered 2018-01-12: 10 mL

## 2018-01-12 MED ORDER — ROPIVACAINE HCL 2 MG/ML IJ SOLN
2.0000 mL | Freq: Once | INTRAMUSCULAR | Status: AC
  Administered 2018-01-12: 10 mL via EPIDURAL
  Filled 2018-01-12: qty 10

## 2018-01-12 MED ORDER — LACTATED RINGERS IV SOLN: 1000.0000 mL | Freq: Once | INTRAVENOUS | Status: DC

## 2018-01-12 MED ORDER — IOPAMIDOL (ISOVUE-M 200) INJECTION 41%
10.0000 mL | Freq: Once | INTRAMUSCULAR | Status: AC
  Administered 2018-01-12: 10 mL via EPIDURAL
  Filled 2018-01-12: qty 10

## 2018-01-12 NOTE — Progress Notes (Signed)
Safety precautions to be maintained throughout the outpatient stay will include: orient to surroundings, keep bed in low position, maintain call bell within reach at all times, provide assistance with transfer out of bed and ambulation.  

## 2018-01-12 NOTE — Patient Instructions (Signed)

## 2018-01-12 NOTE — Progress Notes (Signed)
Patient's Name: Matthew [name]  MRN: [id number]  Referring Provider: Milinda Pointer, MD  DOB: 1936-05-18  PCP: Antionette Char, MD  DOS: 01/12/2018  Note by: Gaspar Cola, MD  Service setting: Ambulatory outpatient  Specialty: Interventional Pain Management  Patient type: Established  Location: [facility] (AMB) Pain Management Facility  Visit type: Interventional Procedure   Primary Reason for Visit: Interventional Pain Management Treatment. CC: Back Pain (low)  Procedure:  Anesthesia, Analgesia, Anxiolysis:  Type: Therapeutic Inter-Laminar Epidural Steroid Injection          Region: Lumbar Level: L4-5 Level. Laterality: Left Paramedial  Type: Local Anesthesia with Moderate (Conscious) Sedation Local Anesthetic: Lidocaine 1% Route: Intravenous (IV) IV Access: Secured Sedation: Meaningful verbal contact was maintained at all times during the procedure  Indication(s): Analgesia and Anxiety   Indications: 1. Chronic lower extremity radicular pain (S1 dermatome) (Bilateral) (L>R)   2. Chronic lumbar radiculopathy   3. Chronic lower extremity pain (Bilateral) (L>R)   4. DDD (degenerative disc disease), lumbar   5. Lumbar spinal stenosis   6. Abnormal MRI, lumbar spine   7. Chronic low back pain (Primary Area of Pain) (Bilateral) (L>R)   8. Long term current use of anticoagulant (Plavix)   9. Chronic radicular pain of lower extremity   10. Chronic pain of lower extremity, bilateral    Pain Score: Pre-procedure: 3 /10 Post-procedure: 3 /10  Pre-op Assessment:  Mr. Formica is a 82 y.o. (year old), male patient, seen today for interventional treatment. 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 Mr. Kesinger has a current medication list which includes the following prescription(s): acetaminophen, aspirin ec, atorvastatin, clopidogrel, gabapentin, hydrochlorothiazide, isosorbide dinitrate, lisinopril, multiple vitamins-minerals, nitroglycerin, pantoprazole, tamsulosin, tramadol, and turmeric, and the following Facility-Administered Medications: fentanyl, lactated ringers, and midazolam. His primarily concern today is the Back Pain (low)  Initial Vital Signs:  Pulse Rate: 69 Temp: 97.9 F (36.6 C) Resp: 18 BP: (!) 124/52 SpO2: 98 %  BMI: Estimated body mass index is 28.8 kg/m as calculated from the following:   Height as of this encounter: 5\' 9"  (1.753 m).   Weight as of this encounter: 195 lb (88.5 kg).  Risk Assessment: Allergies: Reviewed. He has No Known Allergies.  Allergy Precautions: None required Coagulopathies: Reviewed. None identified.  Blood-thinner therapy: None at this time Active Infection(s): Reviewed. None identified. Mr. Vittorio is afebrile  Site Confirmation: Mr. Bachar was asked to confirm the procedure and laterality before marking the site Procedure checklist: Completed Consent: Before the procedure and under the influence of no sedative(s), amnesic(s), or anxiolytics, the patient was informed of the treatment options, risks and possible complications. To fulfill our ethical and legal obligations, as recommended by the American Medical Association's Code of Ethics, I have informed the patient of my clinical impression; the nature and purpose of the treatment or procedure; the risks, benefits, and possible complications of the intervention; the alternatives, including doing nothing; the risk(s) and benefit(s) of the alternative treatment(s) or procedure(s); and the risk(s)  and benefit(s) of doing nothing. The patient was provided information about the general risks and possible complications associated with the procedure. These may include, but are not limited to: failure  to achieve desired goals, infection, bleeding, organ or nerve damage, allergic reactions, paralysis, and death. In addition, the patient was informed of those risks and complications associated to Spine-related procedures, such as failure to decrease pain; infection (i.e.: Meningitis, epidural or intraspinal abscess); bleeding (i.e.: epidural hematoma, subarachnoid hemorrhage, or any other type of intraspinal or peri-dural bleeding); organ or nerve damage (i.e.: Any type of peripheral nerve, nerve root, or spinal cord injury) with subsequent damage to sensory, motor, and/or autonomic systems, resulting in permanent pain, numbness, and/or weakness of one or several areas of the body; allergic reactions; (i.e.: anaphylactic reaction); and/or death. Furthermore, the patient was informed of those risks and complications associated with the medications. These include, but are not limited to: allergic reactions (i.e.: anaphylactic or anaphylactoid reaction(s)); adrenal axis suppression; blood sugar elevation that in diabetics may result in ketoacidosis or comma; water retention that in patients with history of congestive heart failure may result in shortness of breath, pulmonary edema, and decompensation with resultant heart failure; weight gain; swelling or edema; medication-induced neural toxicity; particulate matter embolism and blood vessel occlusion with resultant organ, and/or nervous system infarction; and/or aseptic necrosis of one or more joints. Finally, the patient was informed that Medicine is not an exact science; therefore, there is also the possibility of unforeseen or unpredictable risks and/or possible complications that may result in a catastrophic outcome. The patient indicated having understood very  clearly. We have given the patient no guarantees and we have made no promises. Enough time was given to the patient to ask questions, all of which were answered to the patient's satisfaction. Mr. Mcbain has indicated that he wanted to continue with the procedure. Attestation: I, the ordering provider, attest that I have discussed with the patient the benefits, risks, side-effects, alternatives, likelihood of achieving goals, and potential problems during recovery for the procedure that I have provided informed consent. Date: 01/12/2018  Pre-Procedure Preparation:  Monitoring: As per clinic protocol. Respiration, ETCO2, SpO2, BP, heart rate and rhythm monitor placed and checked for adequate function Safety Precautions: Patient was assessed for positional comfort and pressure points before starting the procedure. Time-out: I initiated and conducted the "Time-out" before starting the procedure, as per protocol. The patient was asked to participate by confirming the accuracy of the "Time Out" information. Verification of the correct person, site, and procedure were performed and confirmed by me, the nursing staff, and the patient. "Time-out" conducted as per Joint Commission's Universal Protocol (UP.01.01.01). "Time-out" Date & Time: 01/12/2018; 1437 hrs.  Description of Procedure Process:   Position: Prone with head of the table was raised to facilitate breathing. Target Area: The interlaminar space, initially targeting the lower laminar border of the superior vertebral body. Approach: Paramedial approach. Area Prepped: Entire Posterior Lumbar Region Prepping solution: ChloraPrep (2% chlorhexidine gluconate and 70% isopropyl alcohol) Safety Precautions: Aspiration looking for blood return was conducted prior to all injections. At no point did we inject any substances, as a needle was being advanced. No attempts were made at seeking any paresthesias. Safe injection practices and needle disposal techniques  used. Medications properly checked for expiration dates. SDV (single dose vial) medications used. Description of the Procedure: Protocol guidelines were followed. The procedure needle was introduced through the skin, ipsilateral to the reported pain, and advanced to the target area. Bone was contacted and the needle walked caudad, until the lamina was cleared. The epidural space was identified using "loss-of-resistance technique" with 2-3 ml of PF-NaCl (0.9% NSS), in a 5cc LOR glass  syringe. Vitals:   01/12/18 1438 01/12/18 1443 01/12/18 1448 01/12/18 1453  BP: 113/63 135/82 (!) 149/69 134/63  Pulse:      Resp: 17 18 18 17   Temp:      SpO2: 96% 96% 96% 95%  Weight:      Height:        Start Time: 1437 hrs. End Time: 1451 hrs. Materials:  Needle(s) Type: Epidural needle Gauge: 17G Length: 3.5-in Medication(s): We administered iopamidol, lidocaine, sodium chloride flush, ropivacaine (PF) 2 mg/mL (0.2%), and triamcinolone acetonide. Please see chart orders for dosing details.  Imaging Guidance (Spinal):  Type of Imaging Technique: Fluoroscopy Guidance (Spinal) Indication(s): Assistance in needle guidance and placement for procedures requiring needle placement in or near specific anatomical locations not easily accessible without such assistance. Exposure Time: Please see nurses notes. Contrast: Before injecting any contrast, we confirmed that the patient did not have an allergy to iodine, shellfish, or radiological contrast. Once satisfactory needle placement was completed at the desired level, radiological contrast was injected. Contrast injected under live fluoroscopy. No contrast complications. See chart for type and volume of contrast used. Fluoroscopic Guidance: I was personally present during the use of fluoroscopy. "Tunnel Vision Technique" used to obtain the best possible view of the target area. Parallax error corrected before commencing the procedure. "Direction-depth-direction"  technique used to introduce the needle under continuous pulsed fluoroscopy. Once target was reached, antero-posterior, oblique, and lateral fluoroscopic projection used confirm needle placement in all planes. Images permanently stored in EMR. Interpretation: I personally interpreted the imaging intraoperatively. Adequate needle placement confirmed in multiple planes. Appropriate spread of contrast into desired area was observed. No evidence of afferent or efferent intravascular uptake. No intrathecal or subarachnoid spread observed. Permanent images saved into the patient's record.  Antibiotic Prophylaxis:  Indication(s): None identified Antibiotic given: None  Post-operative Assessment:  Post-procedure Vital Signs:  Pulse Rate: 69 Temp: 97.9 F (36.6 C) ECG Heart Rate: 66 Resp: 17 BP: 134/63 SpO2: 95 % ETCO2: (no sedation)  EBL: None  Complications: No immediate post-treatment complications observed by team, or reported by patient.  Note: The patient tolerated the entire procedure well. A repeat set of vitals were taken after the procedure and the patient was kept under observation following institutional policy, for this type of procedure. Post-procedural neurological assessment was performed, showing return to baseline, prior to discharge. The patient was provided with post-procedure discharge instructions, including a section on how to identify potential problems. Should any problems arise concerning this procedure, the patient was given instructions to immediately contact us, at any time, without hesitation. In any case, we plan to contact the patient by telephone for a follow-up status report regarding this interventional procedure.  Comments:  No additional relevant information.  Plan of Care    Imaging Orders     DG C-Arm 1-60 Min-No Report  Procedure Orders     Lumbar Epidural Injection  Medications ordered for procedure: Meds ordered this encounter  Medications  .  midazolam (VERSED) 5 MG/5ML injection 1-2 mg    Make sure Flumazenil is available in the pyxis when using this medication. If oversedation occurs, administer 0.2 mg IV over 15 sec. If after 45 sec no response, administer 0.2 mg again over 1 min; may repeat at 1 min intervals; not to exceed 4 doses (1 mg)  . fentaNYL (SUBLIMAZE) injection 25-50 mcg    Make sure Narcan is available in the pyxis when using this medication. In the event of respiratory depression (RR< 8/min):  Titrate NARCAN (naloxone) in increments of 0.1 to 0.2 mg IV at 2-3 minute intervals, until desired degree of reversal.  . lactated ringers infusion 1,000 mL  . iopamidol (ISOVUE-M) 41 % intrathecal injection 10 mL    Must be Myelogram-compatible. If not available, you may substitute with a water-soluble, non-ionic, hypoallergenic, myelogram-compatible radiological contrast medium.  Marland Kitchen lidocaine (XYLOCAINE) 2 % (with pres) injection 200 mg  . sodium chloride flush (NS) 0.9 % injection 2 mL  . ropivacaine (PF) 2 mg/mL (0.2%) (NAROPIN) injection 2 mL  . triamcinolone acetonide (KENALOG-40) injection 40 mg   Medications administered: We administered iopamidol, lidocaine, sodium chloride flush, ropivacaine (PF) 2 mg/mL (0.2%), and triamcinolone acetonide.  See the medical record for exact dosing, route, and time of administration.  New Prescriptions   No medications on file   Disposition: Discharge home  Discharge Date & Time: 01/12/2018; 1459 hrs.   Physician-requested Follow-up: Return for post-procedure eval (2 wks), w/ Dr. Dossie Arbour.  Future Appointments  Date Time Provider [place]  01/30/2018  1:45 PM Milinda Pointer, MD [facility] None   Primary Care Physician: Antionette Char, MD Location: [facility] Outpatient Pain Management Facility Note by: Gaspar Cola, MD Date: 01/12/2018; Time: 3:16 PM  Disclaimer:  Medicine is not an Chief Strategy Officer. The only guarantee in medicine is that nothing is guaranteed. It is  important to note that the decision to proceed with this intervention was based on the information collected from the patient. The Data and conclusions were drawn from the patient's questionnaire, the interview, and the physical examination. Because the information was provided in large part by the patient, it cannot be guaranteed that it has not been purposely or unconsciously manipulated. Every effort has been made to obtain as much relevant data as possible for this evaluation. It is important to note that the conclusions that lead to this procedure are derived in large part from the available data. Always take into account that the treatment will also be dependent on availability of resources and existing treatment guidelines, considered by other Pain Management Practitioners as being common knowledge and practice, at the time of the intervention. For Medico-Legal purposes, it is also important to point out that variation in procedural techniques and pharmacological choices are the acceptable norm. The indications, contraindications, technique, and results of the above procedure should only be interpreted and judged by a Board-Certified Interventional Pain Specialist with extensive familiarity and expertise in the same exact procedure and technique.

## 2018-01-13 ENCOUNTER — Telehealth: Payer: Self-pay

## 2018-01-13 NOTE — Telephone Encounter (Signed)
Post procedure phone call.  Left message.  

## 2018-01-24 ENCOUNTER — Ambulatory Visit: Payer: Medicare HMO | Attending: Neurology

## 2018-01-24 DIAGNOSIS — G3184 Mild cognitive impairment, so stated: Secondary | ICD-10-CM | POA: Insufficient documentation

## 2018-01-24 DIAGNOSIS — G4733 Obstructive sleep apnea (adult) (pediatric): Secondary | ICD-10-CM | POA: Insufficient documentation

## 2018-01-25 ENCOUNTER — Ambulatory Visit: Payer: Self-pay | Admitting: Pain Medicine

## 2018-01-30 ENCOUNTER — Other Ambulatory Visit: Payer: Self-pay

## 2018-01-30 ENCOUNTER — Ambulatory Visit: Payer: Medicare HMO | Attending: Pain Medicine | Admitting: Pain Medicine

## 2018-01-30 ENCOUNTER — Encounter: Payer: Self-pay | Admitting: Pain Medicine

## 2018-01-30 VITALS — BP 116/54 | HR 79 | Temp 98.5°F | Resp 19 | Ht 69.0 in | Wt 195.0 lb

## 2018-01-30 DIAGNOSIS — G8929 Other chronic pain: Secondary | ICD-10-CM | POA: Diagnosis not present

## 2018-01-30 DIAGNOSIS — M479 Spondylosis, unspecified: Secondary | ICD-10-CM | POA: Diagnosis not present

## 2018-01-30 DIAGNOSIS — Z7982 Long term (current) use of aspirin: Secondary | ICD-10-CM | POA: Insufficient documentation

## 2018-01-30 DIAGNOSIS — M79605 Pain in left leg: Secondary | ICD-10-CM | POA: Diagnosis not present

## 2018-01-30 DIAGNOSIS — M5416 Radiculopathy, lumbar region: Secondary | ICD-10-CM

## 2018-01-30 DIAGNOSIS — D696 Thrombocytopenia, unspecified: Secondary | ICD-10-CM | POA: Insufficient documentation

## 2018-01-30 DIAGNOSIS — I252 Old myocardial infarction: Secondary | ICD-10-CM | POA: Diagnosis not present

## 2018-01-30 DIAGNOSIS — Z87891 Personal history of nicotine dependence: Secondary | ICD-10-CM | POA: Insufficient documentation

## 2018-01-30 DIAGNOSIS — M79604 Pain in right leg: Secondary | ICD-10-CM | POA: Diagnosis not present

## 2018-01-30 DIAGNOSIS — G4733 Obstructive sleep apnea (adult) (pediatric): Secondary | ICD-10-CM | POA: Insufficient documentation

## 2018-01-30 DIAGNOSIS — Z7902 Long term (current) use of antithrombotics/antiplatelets: Secondary | ICD-10-CM | POA: Diagnosis not present

## 2018-01-30 DIAGNOSIS — E785 Hyperlipidemia, unspecified: Secondary | ICD-10-CM | POA: Insufficient documentation

## 2018-01-30 DIAGNOSIS — Z951 Presence of aortocoronary bypass graft: Secondary | ICD-10-CM | POA: Insufficient documentation

## 2018-01-30 DIAGNOSIS — Z9889 Other specified postprocedural states: Secondary | ICD-10-CM | POA: Insufficient documentation

## 2018-01-30 DIAGNOSIS — Z79891 Long term (current) use of opiate analgesic: Secondary | ICD-10-CM | POA: Diagnosis not present

## 2018-01-30 DIAGNOSIS — G894 Chronic pain syndrome: Secondary | ICD-10-CM | POA: Diagnosis present

## 2018-01-30 DIAGNOSIS — M5116 Intervertebral disc disorders with radiculopathy, lumbar region: Secondary | ICD-10-CM | POA: Insufficient documentation

## 2018-01-30 DIAGNOSIS — K219 Gastro-esophageal reflux disease without esophagitis: Secondary | ICD-10-CM | POA: Diagnosis not present

## 2018-01-30 DIAGNOSIS — I2510 Atherosclerotic heart disease of native coronary artery without angina pectoris: Secondary | ICD-10-CM | POA: Insufficient documentation

## 2018-01-30 DIAGNOSIS — I739 Peripheral vascular disease, unspecified: Secondary | ICD-10-CM | POA: Diagnosis not present

## 2018-01-30 DIAGNOSIS — M5410 Radiculopathy, site unspecified: Secondary | ICD-10-CM | POA: Diagnosis not present

## 2018-01-30 DIAGNOSIS — Z8249 Family history of ischemic heart disease and other diseases of the circulatory system: Secondary | ICD-10-CM | POA: Insufficient documentation

## 2018-01-30 DIAGNOSIS — Z952 Presence of prosthetic heart valve: Secondary | ICD-10-CM | POA: Diagnosis not present

## 2018-01-30 DIAGNOSIS — M48061 Spinal stenosis, lumbar region without neurogenic claudication: Secondary | ICD-10-CM | POA: Diagnosis not present

## 2018-01-30 DIAGNOSIS — I350 Nonrheumatic aortic (valve) stenosis: Secondary | ICD-10-CM | POA: Insufficient documentation

## 2018-01-30 DIAGNOSIS — Z79899 Other long term (current) drug therapy: Secondary | ICD-10-CM | POA: Insufficient documentation

## 2018-01-30 DIAGNOSIS — I10 Essential (primary) hypertension: Secondary | ICD-10-CM | POA: Diagnosis not present

## 2018-01-30 DIAGNOSIS — Z7901 Long term (current) use of anticoagulants: Secondary | ICD-10-CM

## 2018-01-30 NOTE — Progress Notes (Signed)
Safety precautions to be maintained throughout the outpatient stay will include: orient to surroundings, keep bed in low position, maintain call bell within reach at all times, provide assistance with transfer out of bed and ambulation.  

## 2018-01-30 NOTE — Progress Notes (Signed)
Patient's Name: Matthew [name]  MRN: [id number]  Referring Provider: Antionette Char, MD  DOB: 01-15-1936  PCP: Antionette Char, MD  DOS: 01/30/2018  Note by: Gaspar Cola, MD  Service setting: Ambulatory outpatient  Specialty: Interventional Pain Management  Location: [facility] (AMB) Pain Management Facility    Patient type: Established   Primary Reason(s) for Visit: Encounter for post-procedure evaluation of chronic illness with mild to moderate exacerbation CC: Back Pain (lower and bilateral)  HPI  Mr. Mccree is a 82 y.o. year old, male patient, who comes today for a post-procedure evaluation. He has HLD (hyperlipidemia); Obstructive sleep apnea; Essential hypertension; Coronary atherosclerosis; Aortic valve Replacement; Angina pectoris ([place]); PAD (peripheral artery disease) ([place]); Effort angina ([place]); Coronary artery disease; Thrombocytopenia ([place]); Severe aortic stenosis; Long term current use of opiate analgesic; Long term prescription opiate use; Opiate use; Chronic low back pain (Primary Area of Pain) (Bilateral) (L>R); Chronic lower extremity pain (Bilateral) (L>R); Chronic pain syndrome; Chronic sacroiliac joint pain (Bilateral) (L>R); Chronic hip pain; Long term current use of anticoagulant (Plavix); History of MI (myocardial infarction) (Last 2004); History of alcoholism ([place]); Neurogenic pain; Lumbar facet syndrome (Bilateral) (L>R); DDD (degenerative disc disease), lumbar; Lumbar facet osteoarthritis; Lumbar spondylosis; Gastroesophageal reflux disease without esophagitis; Chronic lower extremity radicular pain (S1 dermatome) (Bilateral) (L>R); Lumbar spinal stenosis; Abnormal MRI, lumbar spine; and Chronic lumbar radiculopathy on their problem list. His primarily concern today is the Back Pain (lower and bilateral)  Pain Assessment: Location: Lower, Right, Left Back Radiating:   Onset: More than a month ago Duration: Chronic pain Quality: Aching Severity: 1 /10 (self-reported pain score)   Note: Reported level is compatible with observation.                         When using our objective Pain Scale, levels between 6 and 10/10 are said to belong in an emergency room, as it progressively worsens from a 6/10, described as severely limiting, requiring emergency care not usually available at an outpatient pain management facility. At a 6/10 level, communication becomes difficult and requires great effort. Assistance to reach the emergency department may be required. Facial flushing and profuse sweating along with potentially dangerous increases in heart rate and blood pressure will be evident. Effect on ADL:   Timing: Constant Modifying factors: procedure/tylenol  Mr. Zheng comes in today for post-procedure evaluation after the treatment done on 01/12/2018.  Further details on both, my assessment(s), as well as the proposed treatment plan, please see below.  Post-Procedure Assessment  01/12/2018 Procedure: Diagnostic left L4-5 interlaminar lumbar epidural steroid injection under fluoroscopic guidance and IV sedation Pre-procedure pain score:  8/10 Post-procedure pain score: 8/10 No relief Influential Factors: BMI: 28.80 kg/m Intra-procedural challenges: None observed.         Assessment challenges: None detected.              Reported side-effects: None.        Post-procedural adverse reactions or complications: None reported         Sedation: Sedation provided. When no sedatives are used, the analgesic levels obtained are directly associated to the effectiveness of the local anesthetics. However, when sedation is provided, the level of analgesia obtained during the initial 1 hour following the intervention, is believed to be the result of a combination of factors. These factors may include, but are not limited to: 1. The effectiveness of the local anesthetics used. 2. The effects of the analgesic(s) and/or  anxiolytic(s) used. 3. The degree of discomfort experienced by the patient  at the time of the procedure. 4. The patients ability and reliability in recalling and recording the events. 5. The presence and influence of possible secondary gains and/or psychosocial factors. Reported result: Relief experienced during the 1st hour after the procedure: 100 % (Ultra-Short Term Relief)            Interpretative annotation: Clinically appropriate result. Analgesia during this period is likely to be Local Anesthetic and/or IV Sedative (Analgesic/Anxiolytic) related.          Effects of local anesthetic: The analgesic effects attained during this period are directly associated to the localized infiltration of local anesthetics and therefore cary significant diagnostic value as to the etiological location, or anatomical origin, of the pain. Expected duration of relief is directly dependent on the pharmacodynamics of the local anesthetic used. Long-acting (4-6 hours) anesthetics used.  Reported result: Relief during the next 4 to 6 hour after the procedure: 100 % (Short-Term Relief)            Interpretative annotation: Clinically appropriate result. Analgesia during this period is likely to be Local Anesthetic-related.          Long-term benefit: Defined as the period of time past the expected duration of local anesthetics (1 hour for short-acting and 4-6 hours for long-acting). With the possible exception of prolonged sympathetic blockade from the local anesthetics, benefits during this period are typically attributed to, or associated with, other factors such as analgesic sensory neuropraxia, antiinflammatory effects, or beneficial biochemical changes provided by agents other than the local anesthetics.  Reported result: Extended relief following procedure: 80 % (Long-Term Relief)            Interpretative annotation: Clinically appropriate result. Good relief. No permanent benefit expected. Inflammation plays a part in the etiology to the pain.          Current benefits: Defined as  reported results that persistent at this point in time.   Analgesia: 80-90 % Mr. Rosten reports improvement of extremity symptoms. Function: Mr. Speigner reports improvement in function ROM: Mr. Eguia reports improvement in ROM Interpretative annotation: Ongoing benefit. Therapeutic success. Effective therapeutic approach.          Interpretation: Results would suggest a successful diagnostic intervention.                  Plan:  Set up procedure as a PRN palliative treatment option for this patient.                 Laboratory Chemistry  Inflammation Markers (CRP: Acute Phase) (ESR: Chronic Phase) Lab Results  Component Value Date   CRP 0.4 08/31/2017   ESRSEDRATE 3 08/31/2017                         Renal Function Markers Lab Results  Component Value Date   BUN 17 08/31/2017   CREATININE 1.03 08/31/2017   GFRAA 80 08/31/2017   GFRNONAA 69 08/31/2017                 Hepatic Function Markers Lab Results  Component Value Date   AST 19 08/31/2017   ALT 13 (L) 05/28/2016   ALBUMIN 4.6 08/31/2017   ALKPHOS 60 08/31/2017                 Electrolytes Lab Results  Component Value Date   NA 141 08/31/2017   K 3.5  08/31/2017   CL 101 08/31/2017   CALCIUM 9.8 08/31/2017   MG 1.8 08/31/2017                        Neuropathy Markers Lab Results  Component Value Date   VITAMINB12 416 08/31/2017   HGBA1C 5.1 05/28/2016                 Bone Pathology Markers Lab Results  Component Value Date   25OHVITD1 31 08/31/2017   25OHVITD2 <1.0 08/31/2017   25OHVITD3 31 08/31/2017                         Coagulation Parameters Lab Results  Component Value Date   INR 1.14 05/28/2016   LABPROT 14.8 05/28/2016   APTT 37 05/28/2016   PLT 78 (L) 06/03/2016                 Cardiovascular Markers Lab Results  Component Value Date   HGB 11.1 (L) 06/03/2016   HCT 35.1 (L) 06/03/2016                 Note: Lab results reviewed.  Recent Diagnostic Imaging Results  DG C-Arm 1-60  Min-No Report Fluoroscopy was utilized by the requesting physician.  No radiographic  interpretation.   Complexity Note: I personally reviewed the fluoroscopic imaging of the procedure.                        Meds   Current Outpatient Medications:  .  acetaminophen (TYLENOL) 325 MG tablet, Take 650 mg by mouth daily as needed for mild pain or headache. , Disp: , Rfl:  .  aspirin EC 81 MG tablet, Take 1 tablet (81 mg total) by mouth daily., Disp: 90 tablet, Rfl: 3 .  atorvastatin (LIPITOR) 80 MG tablet, TAKE 1 TABLET BY MOUTH  DAILY, Disp: 90 tablet, Rfl: 1 .  clopidogrel (PLAVIX) 75 MG tablet, TAKE 1 TABLET BY MOUTH  DAILY WITH BREAKFAST, Disp: 90 tablet, Rfl: 1 .  gabapentin (NEURONTIN) 100 MG capsule, Take 1-3 capsules (100-300 mg total) 4 (four) times daily by mouth. Follow written titration schedule., Disp: 360 capsule, Rfl: 2 .  hydrochlorothiazide (HYDRODIURIL) 25 MG tablet, Take 25 mg by mouth daily., Disp: , Rfl:  .  isosorbide dinitrate (ISORDIL) 10 MG tablet, Take 20 mg by mouth every morning. Take 10 mg by mouth in the afternoon, Disp: , Rfl:  .  lisinopril (PRINIVIL,ZESTRIL) 20 MG tablet, TAKE 1 TABLET BY MOUTH  DAILY, Disp: 90 tablet, Rfl: 1 .  Multiple Vitamins-Minerals (PRESERVISION AREDS PO), Take 1 tablet by mouth 2 (two) times daily., Disp: , Rfl:  .  nitroGLYCERIN (NITROSTAT) 0.4 MG SL tablet, Place 0.4 mg under the tongue every 5 (five) minutes as needed for chest pain., Disp: , Rfl:  .  pantoprazole (PROTONIX) 40 MG tablet, Take 20 mg by mouth daily., Disp: , Rfl:  .  tamsulosin (FLOMAX) 0.4 MG CAPS capsule, Take 1 capsule (0.4 mg total) by mouth daily., Disp: 30 capsule, Rfl: 3 .  traMADol (ULTRAM) 50 MG tablet, Take 1 tablet (50 mg total) daily by mouth., Disp: 90 tablet, Rfl: 0 .  TURMERIC PO, Take at bedtime by mouth., Disp: , Rfl:   ROS  Constitutional: Denies any fever or chills Gastrointestinal: No reported hemesis, hematochezia, vomiting, or acute GI  distress Musculoskeletal: Denies any acute onset joint swelling, redness, loss of  ROM, or weakness Neurological: No reported episodes of acute onset apraxia, aphasia, dysarthria, agnosia, amnesia, paralysis, loss of coordination, or loss of consciousness  Allergies  Mr. Hardcastle has No Known Allergies.  PFSH  Drug: Mr. Hyle  reports that he does not use drugs. Alcohol:  reports that he does not drink alcohol. Tobacco:  reports that he quit smoking about 28 years ago. His smoking use included cigarettes. He has a 50.00 pack-year smoking history. he has never used smokeless tobacco. Medical:  has a past medical history of AS (aortic stenosis), CAD (coronary artery disease), Carotid bruit, GERD (gastroesophageal reflux disease), History of kidney stones, HOH (hard of hearing), HTN (hypertension), Hyperlipidemia, Myocardial infarction ([place]) (1996), Nephrolithiasis, OSA (obstructive sleep apnea), PAD (peripheral artery disease) ([place]) (02/2014), Shortness of breath, and Thrombocytopenia ([place]). Surgical: Mr. Burdin  has a past surgical history that includes Stomach surgery; Coronary artery bypass graft (1991); Inguinal hernia repair (11/10/2011); left heart catheterization with coronary/graft angiogram (N/A, 03/06/2014); abdominal aortagram (N/A, 03/06/2014); Cardiac catheterization (02/2014); Cardiac catheterization (N/A, 07/31/2015); Cardiac catheterization (N/A, 07/31/2015); Cardiac catheterization (N/A, 05/26/2016); Cysto; Transcatheter aortic valve replacement, transfemoral (N/A, 06/01/2016); and TEE without cardioversion (N/A, 06/01/2016). Family: family history includes Heart disease in his father and mother.  Constitutional Exam  General appearance: Well nourished, well developed, and well hydrated. In no apparent acute distress Vitals:   01/30/18 1356 01/30/18 1357  BP: (!) 104/46 (!) 116/54  Pulse: 79   Resp: 19   Temp: 98.5 F (36.9 C)   TempSrc: Oral   SpO2: 99%   Weight: 195 lb (88.5 kg)   Height:  _0  (1.753 m)    BMI Assessment: Estimated body mass index is 28.8 kg/m as calculated from the following:   Height as of this encounter: _1  (1.753 m).   Weight as of this encounter: 195 lb (88.5 kg).  BMI interpretation table: BMI level Category Range association with higher incidence of chronic pain  <18 kg/m2 Underweight   18.5-24.9 kg/m2 Ideal body weight   25-29.9 kg/m2 Overweight Increased incidence by 20%  30-34.9 kg/m2 Obese (Class I) Increased incidence by 68%  35-39.9 kg/m2 Severe obesity (Class II) Increased incidence by 136%  >40 kg/m2 Extreme obesity (Class III) Increased incidence by 254%   BMI Readings from Last 4 Encounters:  01/30/18 28.80 kg/m  01/12/18 28.80 kg/m  12/26/17 27.32 kg/m  11/15/17 27.32 kg/m   Wt Readings from Last 4 Encounters:  01/30/18 195 lb (88.5 kg)  01/12/18 195 lb (88.5 kg)  12/26/17 185 lb (83.9 kg)  11/15/17 185 lb (83.9 kg)  Psych/Mental status: Alert, oriented x 3 (person, place, & time)       Eyes: PERLA Respiratory: No evidence of acute respiratory distress  Cervical Spine Area Exam  Skin & Axial Inspection: No masses, redness, edema, swelling, or associated skin lesions Alignment: Symmetrical Functional ROM: Unrestricted ROM      Stability: No instability detected Muscle Tone/Strength: Functionally intact. No obvious neuro-muscular anomalies detected. Sensory (Neurological): Unimpaired Palpation: No palpable anomalies              Upper Extremity (UE) Exam    Side: Right upper extremity  Side: Left upper extremity  Skin & Extremity Inspection: Skin color, temperature, and hair growth are WNL. No peripheral edema or cyanosis. No masses, redness, swelling, asymmetry, or associated skin lesions. No contractures.  Skin & Extremity Inspection: Skin color, temperature, and hair growth are WNL. No peripheral edema or cyanosis. No masses, redness, swelling, asymmetry, or associated  skin lesions. No contractures.  Functional  ROM: Unrestricted ROM          Functional ROM: Unrestricted ROM          Muscle Tone/Strength: Functionally intact. No obvious neuro-muscular anomalies detected.  Muscle Tone/Strength: Functionally intact. No obvious neuro-muscular anomalies detected.  Sensory (Neurological): Unimpaired          Sensory (Neurological): Unimpaired          Palpation: No palpable anomalies              Palpation: No palpable anomalies              Specialized Test(s): Deferred         Specialized Test(s): Deferred          Thoracic Spine Area Exam  Skin & Axial Inspection: No masses, redness, or swelling Alignment: Symmetrical Functional ROM: Unrestricted ROM Stability: No instability detected Muscle Tone/Strength: Functionally intact. No obvious neuro-muscular anomalies detected. Sensory (Neurological): Unimpaired Muscle strength & Tone: No palpable anomalies  Lumbar Spine Area Exam  Skin & Axial Inspection: No masses, redness, or swelling Alignment: Symmetrical Functional ROM: Unrestricted ROM      Stability: No instability detected Muscle Tone/Strength: Functionally intact. No obvious neuro-muscular anomalies detected. Sensory (Neurological): Unimpaired Palpation: No palpable anomalies       Provocative Tests: Lumbar Hyperextension and rotation test: evaluation deferred today       Lumbar Lateral bending test: evaluation deferred today       Patrick's Maneuver: evaluation deferred today                    Gait & Posture Assessment  Ambulation: Unassisted Gait: Relatively normal for age and body habitus Posture: WNL   Lower Extremity Exam    Side: Right lower extremity  Side: Left lower extremity  Skin & Extremity Inspection: Skin color, temperature, and hair growth are WNL. No peripheral edema or cyanosis. No masses, redness, swelling, asymmetry, or associated skin lesions. No contractures.  Skin & Extremity Inspection: Skin color, temperature, and hair growth are WNL. No peripheral edema or  cyanosis. No masses, redness, swelling, asymmetry, or associated skin lesions. No contractures.  Functional ROM: Unrestricted ROM          Functional ROM: Unrestricted ROM          Muscle Tone/Strength: Functionally intact. No obvious neuro-muscular anomalies detected.  Muscle Tone/Strength: Functionally intact. No obvious neuro-muscular anomalies detected.  Sensory (Neurological): Unimpaired  Sensory (Neurological): Unimpaired  Palpation: No palpable anomalies  Palpation: No palpable anomalies   Assessment  Primary Diagnosis & Pertinent Problem List: The primary encounter diagnosis was Chronic lower extremity radicular pain (S1 dermatome) (Bilateral) (L>R). Diagnoses of Chronic lower extremity pain (Bilateral) (L>R), Chronic lumbar radiculopathy, and Long term current use of anticoagulant (Plavix) were also pertinent to this visit.  Status Diagnosis  Controlled Controlled Controlled 1. Chronic lower extremity radicular pain (S1 dermatome) (Bilateral) (L>R)   2. Chronic lower extremity pain (Bilateral) (L>R)   3. Chronic lumbar radiculopathy   4. Long term current use of anticoagulant (Plavix)     Problems updated and reviewed during this visit: No problems updated. Plan of Care  Pharmacotherapy (Medications Ordered): No orders of the defined types were placed in this encounter.  Medications administered today: Nehemiah Settle. Virrueta had no medications administered during this visit.   Procedure Orders     Lumbar Epidural Injection Lab Orders  No laboratory test(s) ordered today   Imaging Orders  No  imaging studies ordered today   Referral Orders  No referral(s) requested today    Interventional management options: Planned, scheduled, and/or pending:   NOTE:Stop Plavix for7 days forprocedures. None at this point.   Considering:   Diagnostic bilateral lumbar facet block + bilateral sacroiliac joint block #2  Possible bilateral lumbar facet RFA + bilateral sacroiliac joint  RFA  Diagnostic left-sided L4-5 interlaminar lumbar epidural steroid injection  Diagnostic left-sided L2-3 transforaminal epidural steroid injection  Diagnostic bilateral L3-4 transforaminal epidural steroid injections  Diagnostic bilateral hip injections   Palliative PRN treatment(s):   Diagnostic left L4-5 interlaminar lumbar epidural steroid injection under fluoroscopic guidance and IV sedation   Provider-requested follow-up: Return for PRN Procedure: (L) L4-5 LESI #2 (w/ sedation).  No future appointments. Primary Care Physician: Antionette Char, MD Location: [facility] Note by: Gaspar Cola, MD Date: 01/30/2018; Time: 4:42 PM

## 2018-01-30 NOTE — Patient Instructions (Signed)

## 2018-02-13 ENCOUNTER — Telehealth: Payer: Self-pay | Admitting: Cardiovascular Disease

## 2018-02-13 NOTE — Telephone Encounter (Signed)
Patient pharmacy coverage changed   Please change medications to be sent to [facility] order

## 2018-02-13 NOTE — Telephone Encounter (Signed)
Pt's pharmacy has been updated to [facility] per request.

## 2018-02-23 ENCOUNTER — Telehealth: Payer: Self-pay | Admitting: Cardiovascular Disease

## 2018-02-23 NOTE — Telephone Encounter (Signed)
Lmov for patient to call and schedule appointment ° °

## 2018-02-23 NOTE — Telephone Encounter (Signed)
Pt is due for f/u appointment with Dr. Fletcher Anon.

## 2018-02-23 NOTE — Telephone Encounter (Signed)
° °*  STAT* If patient is at the pharmacy, call can be transferred to refill team.   1. Which medications need to be refilled? (please list name of each medication and dose if known)  Atorvastatin (LIPITOR) 80 MG 1 tablet daily Clopidogrel (PLAVIX) 75 MG 1 tablet daily with breakfast  Hydrochlorothiazide (HYDRODIURIL) 25 MG 1 tablet daily Lisinopril (PRINIVIL,ZESTRIL) 20 MG 1 tablet daily  NitroGLYCERIN (NITROSTAT) 0.4 MG as needed Metoprolol    2. Which pharmacy/location (including street and city if local pharmacy) is medication to be sent to? [facility]  3. Do they need a 30 day or 90 day supply? 90 day

## 2018-02-27 ENCOUNTER — Other Ambulatory Visit: Payer: Self-pay | Admitting: *Deleted

## 2018-02-27 MED ORDER — METOPROLOL TARTRATE 25 MG PO TABS: 12.5000 mg | ORAL_TABLET | Freq: Every day | ORAL | 0 refills | Status: DC

## 2018-02-27 NOTE — Telephone Encounter (Signed)
Pt spouse calling to see if we can please send in Metoprolol to [facility] in [place]. He only has one pill left of this  Pt is coming in on 04/11/18 for FU appointment

## 2018-02-27 NOTE — Telephone Encounter (Signed)
Requested Prescriptions   Signed Prescriptions Disp Refills  . metoprolol tartrate (LOPRESSOR) 25 MG tablet 45 tablet 0    Sig: Take 0.5 tablets (12.5 mg total) by mouth daily.    Authorizing Provider: [name] A    Ordering User: Britt Bottom

## 2018-03-09 ENCOUNTER — Ambulatory Visit (HOSPITAL_BASED_OUTPATIENT_CLINIC_OR_DEPARTMENT_OTHER): Payer: Medicare HMO | Admitting: Pain Medicine

## 2018-03-09 ENCOUNTER — Other Ambulatory Visit: Payer: Self-pay

## 2018-03-09 ENCOUNTER — Ambulatory Visit
Admission: RE | Admit: 2018-03-09 | Discharge: 2018-03-09 | Disposition: A | Discharge status: Home / Self Care | Payer: Medicare HMO | Source: Ambulatory Visit | Attending: Pain Medicine | Admitting: Pain Medicine

## 2018-03-09 ENCOUNTER — Encounter: Payer: Self-pay | Admitting: Pain Medicine

## 2018-03-09 VITALS — BP 126/60 | HR 65 | Temp 98.2°F | Resp 13 | Ht 69.0 in | Wt 195.0 lb

## 2018-03-09 DIAGNOSIS — G8929 Other chronic pain: Secondary | ICD-10-CM | POA: Insufficient documentation

## 2018-03-09 DIAGNOSIS — M5116 Intervertebral disc disorders with radiculopathy, lumbar region: Secondary | ICD-10-CM | POA: Diagnosis not present

## 2018-03-09 DIAGNOSIS — M5410 Radiculopathy, site unspecified: Secondary | ICD-10-CM

## 2018-03-09 DIAGNOSIS — M4726 Other spondylosis with radiculopathy, lumbar region: Secondary | ICD-10-CM | POA: Diagnosis not present

## 2018-03-09 DIAGNOSIS — M5442 Lumbago with sciatica, left side: Secondary | ICD-10-CM

## 2018-03-09 DIAGNOSIS — Z7902 Long term (current) use of antithrombotics/antiplatelets: Secondary | ICD-10-CM | POA: Insufficient documentation

## 2018-03-09 DIAGNOSIS — M5136 Other intervertebral disc degeneration, lumbar region: Secondary | ICD-10-CM

## 2018-03-09 DIAGNOSIS — Z951 Presence of aortocoronary bypass graft: Secondary | ICD-10-CM | POA: Insufficient documentation

## 2018-03-09 DIAGNOSIS — M545 Low back pain: Secondary | ICD-10-CM | POA: Diagnosis present

## 2018-03-09 DIAGNOSIS — M79605 Pain in left leg: Secondary | ICD-10-CM

## 2018-03-09 DIAGNOSIS — M5441 Lumbago with sciatica, right side: Secondary | ICD-10-CM

## 2018-03-09 DIAGNOSIS — Z7901 Long term (current) use of anticoagulants: Secondary | ICD-10-CM

## 2018-03-09 DIAGNOSIS — Z952 Presence of prosthetic heart valve: Secondary | ICD-10-CM | POA: Diagnosis not present

## 2018-03-09 DIAGNOSIS — M79604 Pain in right leg: Secondary | ICD-10-CM

## 2018-03-09 DIAGNOSIS — M5416 Radiculopathy, lumbar region: Secondary | ICD-10-CM

## 2018-03-09 MED ORDER — FENTANYL CITRATE (PF) 100 MCG/2ML IJ SOLN: 25.0000 ug | INTRAMUSCULAR | Status: DC | PRN

## 2018-03-09 MED ORDER — SODIUM CHLORIDE 0.9% FLUSH
2.0000 mL | Freq: Once | INTRAVENOUS | Status: AC
  Administered 2018-03-09: 10 mL

## 2018-03-09 MED ORDER — TRIAMCINOLONE ACETONIDE 40 MG/ML IJ SUSP
40.0000 mg | Freq: Once | INTRAMUSCULAR | Status: AC
  Administered 2018-03-09: 40 mg
  Filled 2018-03-09: qty 1

## 2018-03-09 MED ORDER — MIDAZOLAM HCL 5 MG/5ML IJ SOLN: 1.0000 mg | INTRAMUSCULAR | Status: DC | PRN

## 2018-03-09 MED ORDER — ROPIVACAINE HCL 2 MG/ML IJ SOLN
2.0000 mL | Freq: Once | INTRAMUSCULAR | Status: AC
  Administered 2018-03-09: 10 mL via EPIDURAL
  Filled 2018-03-09: qty 10

## 2018-03-09 MED ORDER — LACTATED RINGERS IV SOLN: 1000.0000 mL | Freq: Once | INTRAVENOUS | Status: DC

## 2018-03-09 MED ORDER — SODIUM CHLORIDE 0.9 % IJ SOLN
INTRAMUSCULAR | Status: AC
  Filled 2018-03-09: qty 10

## 2018-03-09 MED ORDER — LIDOCAINE HCL 2 % IJ SOLN
20.0000 mL | Freq: Once | INTRAMUSCULAR | Status: AC
  Administered 2018-03-09: 400 mg
  Filled 2018-03-09: qty 20

## 2018-03-09 MED ORDER — IOPAMIDOL (ISOVUE-M 200) INJECTION 41%
10.0000 mL | Freq: Once | INTRAMUSCULAR | Status: AC
  Administered 2018-03-09: 10 mL via EPIDURAL
  Filled 2018-03-09: qty 10

## 2018-03-09 NOTE — Progress Notes (Signed)
Patient's Name: Matthew [name]  MRN: [id number]  Referring Provider: Milinda Pointer, MD  DOB: [birth year]  PCP: Antionette Char, MD  DOS: 03/09/2018  Note by: Gaspar Cola, MD  Service setting: Ambulatory outpatient  Specialty: Interventional Pain Management  Patient type: Established  Location: [facility] (AMB) Pain Management Facility  Visit type: Interventional Procedure   Primary Reason for Visit: Interventional Pain Management Treatment. CC: Back Pain (lower)  Procedure:       Anesthesia, Analgesia, Anxiolysis:  Type: Therapeutic Inter-Laminar Epidural Steroid Injection #2  Region: Lumbar Level: L4-5 Level. Laterality: Left Paramedial  Type: Local Anesthesia Indication(s): Analgesia         Route: Infiltration ([place]/IM) IV Access: Declined Sedation: Declined  Local Anesthetic: Lidocaine 1-2%   Indications: 1. DDD (degenerative disc disease), lumbar   2. Lumbar spondylosis with radiculopathy   3. Chronic lumbar radiculopathy   4. Chronic lower extremity radicular pain (S1 dermatome) (Bilateral) (L>R)   5. Chronic lower extremity pain (Bilateral) (L>R)   6. Chronic low back pain (Primary Area of Pain) (Bilateral) (L>R)   7. Long term current use of anticoagulant (Plavix)    Pain Score: Pre-procedure: 2 /10 Post-procedure: 0-No pain/10  Pre-op Assessment:  Matthew [name] is a 82 y.o. (year old), male patient, seen today for interventional treatment. 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 Matthew [name]  has a current medication list which includes the following prescription(s): acetaminophen, aspirin ec, atorvastatin, clopidogrel, hydrochlorothiazide, isosorbide dinitrate, lisinopril, metoprolol tartrate, multiple vitamins-minerals, nitroglycerin, pantoprazole, tamsulosin, tramadol, turmeric, and gabapentin. His primarily concern today is the Back Pain (lower)  Initial Vital Signs:  Pulse Rate: 65 Temp: 98.2 F (36.8 C) Resp: 16 BP: (!) 118/58 SpO2: 100 %  BMI: Estimated body mass index is 28.8 kg/m as calculated from the following:   Height as of this encounter: 5\' 9"  (1.753 m).   Weight as of this encounter: 195 lb (88.5 kg).  Risk Assessment: Allergies: Reviewed. He has No Known Allergies.  Allergy Precautions: None required Coagulopathies: Reviewed. None identified.  Blood-thinner therapy: None at this time Active Infection(s): Reviewed. None identified. Matthew [name] is afebrile  Site Confirmation: Matthew [name] was asked to confirm the procedure and laterality before marking the site Procedure checklist: Completed Consent: Before the procedure and under the influence of no sedative(s), amnesic(s), or anxiolytics, the patient was informed of the treatment options, risks and possible complications. To fulfill our ethical and legal obligations, as recommended by the American Medical Association's Code of Ethics, I have informed the patient of my clinical impression; the nature and purpose of the treatment or procedure; the risks, benefits, and possible complications of the intervention; the alternatives, including doing nothing; the risk(s) and benefit(s) of the alternative treatment(s) or procedure(s); and the risk(s) and benefit(s) of doing nothing. The patient was provided information about the general risks and possible complications associated with the procedure. These may include, but are not limited to: failure to achieve desired goals, infection, bleeding, organ or nerve damage, allergic  reactions, paralysis, and death. In addition, the patient was informed of those risks and complications associated to Spine-related procedures, such as failure to decrease pain; infection (i.e.: Meningitis, epidural or intraspinal  abscess); bleeding (i.e.: epidural hematoma, subarachnoid hemorrhage, or any other type of intraspinal or peri-dural bleeding); organ or nerve damage (i.e.: Any type of peripheral nerve, nerve root, or spinal cord injury) with subsequent damage to sensory, motor, and/or autonomic systems, resulting in permanent pain, numbness, and/or weakness of one or several areas of the body; allergic reactions; (i.e.: anaphylactic reaction); and/or death. Furthermore, the patient was informed of those risks and complications associated with the medications. These include, but are not limited to: allergic reactions (i.e.: anaphylactic or anaphylactoid reaction(s)); adrenal axis suppression; blood sugar elevation that in diabetics may result in ketoacidosis or comma; water retention that in patients with history of congestive heart failure may result in shortness of breath, pulmonary edema, and decompensation with resultant heart failure; weight gain; swelling or edema; medication-induced neural toxicity; particulate matter embolism and blood vessel occlusion with resultant organ, and/or nervous system infarction; and/or aseptic necrosis of one or more joints. Finally, the patient was informed that Medicine is not an exact science; therefore, there is also the possibility of unforeseen or unpredictable risks and/or possible complications that may result in a catastrophic outcome. The patient indicated having understood very clearly. We have given the patient no guarantees and we have made no promises. Enough time was given to the patient to ask questions, all of which were answered to the patient's satisfaction. Matthew [name] has indicated that he wanted to continue with the procedure. Attestation: I, the  ordering provider, attest that I have discussed with the patient the benefits, risks, side-effects, alternatives, likelihood of achieving goals, and potential problems during recovery for the procedure that I have provided informed consent. Date  Time: 03/09/2018  9:05 AM  Pre-Procedure Preparation:  Monitoring: As per clinic protocol. Respiration, ETCO2, SpO2, BP, heart rate and rhythm monitor placed and checked for adequate function Safety Precautions: Patient was assessed for positional comfort and pressure points before starting the procedure. Time-out: I initiated and conducted the "Time-out" before starting the procedure, as per protocol. The patient was asked to participate by confirming the accuracy of the "Time Out" information. Verification of the correct person, site, and procedure were performed and confirmed by me, the nursing staff, and the patient. "Time-out" conducted as per Joint Commission's Universal Protocol (UP.01.01.01). Time: 0957  Description of Procedure:       Position: Prone with head of the table was raised to facilitate breathing. Target Area: The interlaminar space, initially targeting the lower laminar border of the superior vertebral body. Approach: Paramedial approach. Area Prepped: Entire Posterior Lumbar Region Prepping solution: ChloraPrep (2% chlorhexidine gluconate and 70% isopropyl alcohol) Safety Precautions: Aspiration looking for blood return was conducted prior to all injections. At no point did we inject any substances, as a needle was being advanced. No attempts were made at seeking any paresthesias. Safe injection practices and needle disposal techniques used. Medications properly checked for expiration dates. SDV (single dose vial) medications used. Description of the Procedure: Protocol guidelines were followed. The procedure needle was introduced through the skin, ipsilateral to the reported pain, and advanced to the target area. Bone was contacted and  the needle walked caudad, until the lamina was cleared. The epidural space was identified using "loss-of-resistance technique" with 2-3 ml of PF-NaCl (0.9% NSS), in a 5cc LOR glass syringe. Vitals:   03/09/18 0955 03/09/18 1000 03/09/18 1005 03/09/18 1011  BP: (!) 131/113 137/81 130/80 126/60  Pulse:      Resp: 19 17 15 13   Temp:      TempSrc:  SpO2: 96% 96% 95% 95%  Weight:      Height:        Start Time: 0957 hrs. End Time: 1006 hrs. Materials:  Needle(s) Type: Epidural needle Gauge: 17G Length: 3.5-in Medication(s): Please see orders for medications and dosing details.  Imaging Guidance (Spinal):  Type of Imaging Technique: Fluoroscopy Guidance (Spinal) Indication(s): Assistance in needle guidance and placement for procedures requiring needle placement in or near specific anatomical locations not easily accessible without such assistance. Exposure Time: Please see nurses notes. Contrast: Before injecting any contrast, we confirmed that the patient did not have an allergy to iodine, shellfish, or radiological contrast. Once satisfactory needle placement was completed at the desired level, radiological contrast was injected. Contrast injected under live fluoroscopy. No contrast complications. See chart for type and volume of contrast used. Fluoroscopic Guidance: I was personally present during the use of fluoroscopy. "Tunnel Vision Technique" used to obtain the best possible view of the target area. Parallax error corrected before commencing the procedure. "Direction-depth-direction" technique used to introduce the needle under continuous pulsed fluoroscopy. Once target was reached, antero-posterior, oblique, and lateral fluoroscopic projection used confirm needle placement in all planes. Images permanently stored in EMR. Interpretation: I personally interpreted the imaging intraoperatively. Adequate needle placement confirmed in multiple planes. Appropriate spread of contrast into  desired area was observed. No evidence of afferent or efferent intravascular uptake. No intrathecal or subarachnoid spread observed. Permanent images saved into the patient's record.  Antibiotic Prophylaxis:   Anti-infectives (From admission, onward)   None     Indication(s): None identified  Post-operative Assessment:  Post-procedure Vital Signs:  Pulse Rate: 65 Temp: 98.2 F (36.8 C) Resp: 13 BP: 126/60 SpO2: 95 %  EBL: None  Complications: No immediate post-treatment complications observed by team, or reported by patient.  Note: The patient tolerated the entire procedure well. A repeat set of vitals were taken after the procedure and the patient was kept under observation following institutional policy, for this type of procedure. Post-procedural neurological assessment was performed, showing return to baseline, prior to discharge. The patient was provided with post-procedure discharge instructions, including a section on how to identify potential problems. Should any problems arise concerning this procedure, the patient was given instructions to immediately contact us, at any time, without hesitation. In any case, we plan to contact the patient by telephone for a follow-up status report regarding this interventional procedure.  Comments:  No additional relevant information.  Plan of Care    Imaging Orders     DG C-Arm 1-60 Min-No Report Procedure Orders    No procedure(s) ordered today    Medications ordered for procedure: Meds ordered this encounter  Medications  . iopamidol (ISOVUE-M) 41 % intrathecal injection 10 mL    Must be Myelogram-compatible. If not available, you may substitute with a water-soluble, non-ionic, hypoallergenic, myelogram-compatible radiological contrast medium.  Marland Kitchen lidocaine (XYLOCAINE) 2 % (with pres) injection 400 mg  . sodium chloride flush (NS) 0.9 % injection 2 mL  . ropivacaine (PF) 2 mg/mL (0.2%) (NAROPIN) injection 2 mL  . triamcinolone  acetonide (KENALOG-40) injection 40 mg   Medications administered: We administered iopamidol, lidocaine, sodium chloride flush, ropivacaine (PF) 2 mg/mL (0.2%), and triamcinolone acetonide.  See the medical record for exact dosing, route, and time of administration.  New Prescriptions   No medications on file   Disposition: Discharge home  Discharge Date & Time: 03/09/2018; 1016 hrs.   Physician-requested Follow-up: Return for post-procedure eval (2 wks), w/ Dr. Dossie Arbour.  Future Appointments  Date Time Provider [place]  03/29/2018 10:15 AM Milinda Pointer, MD [facility] None  04/11/2018  2:30 PM Idolina Primer Areta Haber, PA-C [id number]   Primary Care Physician: Antionette Char, MD Location: [facility] Note by: Gaspar Cola, MD Date: 03/09/2018; Time: 12:34 PM  Disclaimer:  Medicine is not an exact science. The only guarantee in medicine is that nothing is guaranteed. It is important to note that the decision to proceed with this intervention was based on the information collected from the patient. The Data and conclusions were drawn from the patient's questionnaire, the interview, and the physical examination. Because the information was provided in large part by the patient, it cannot be guaranteed that it has not been purposely or unconsciously manipulated. Every effort has been made to obtain as much relevant data as possible for this evaluation. It is important to note that the conclusions that lead to this procedure are derived in large part from the available data. Always take into account that the treatment will also be dependent on availability of resources and existing treatment guidelines, considered by other Pain Management Practitioners as being common knowledge and practice, at the time of the intervention. For Medico-Legal purposes, it is also important to point out that variation in procedural techniques and pharmacological choices  are the acceptable norm. The indications, contraindications, technique, and results of the above procedure should only be interpreted and judged by a Board-Certified Interventional Pain Specialist with extensive familiarity and expertise in the same exact procedure and technique.

## 2018-03-09 NOTE — Progress Notes (Signed)
Safety precautions to be maintained throughout the outpatient stay will include: orient to surroundings, keep bed in low position, maintain call bell within reach at all times, provide assistance with transfer out of bed and ambulation.  

## 2018-03-09 NOTE — Patient Instructions (Signed)

## 2018-03-10 ENCOUNTER — Telehealth: Payer: Self-pay

## 2018-03-10 NOTE — Telephone Encounter (Signed)
Post procedure phone call.  Left messge 

## 2018-03-14 ENCOUNTER — Ambulatory Visit: Payer: Medicare HMO | Admitting: Pain Medicine

## 2018-03-20 ENCOUNTER — Other Ambulatory Visit: Payer: Self-pay

## 2018-03-20 ENCOUNTER — Telehealth: Payer: Self-pay | Admitting: Cardiovascular Disease

## 2018-03-20 MED ORDER — ATORVASTATIN CALCIUM 80 MG PO TABS: 80.0000 mg | ORAL_TABLET | Freq: Every day | ORAL | 0 refills | Status: DC

## 2018-03-20 MED ORDER — LISINOPRIL 20 MG PO TABS: 20.0000 mg | ORAL_TABLET | Freq: Every day | ORAL | 0 refills | Status: DC

## 2018-03-20 NOTE — Telephone Encounter (Signed)
°*  STAT* If patient is at the pharmacy, call can be transferred to refill team.   1. Which medications need to be refilled? (please list name of each medication and dose if known)  Lipitor and Lisinopril    2. Which pharmacy/location (including street and city if local pharmacy) is medication to be sent to? [facility] in [place]   3. Do they need a 30 day or 90 day supply? 30 day

## 2018-03-20 NOTE — Telephone Encounter (Signed)
atorvastatin (LIPITOR) 80 MG tablet 90 tablet 0 03/20/2018   Sig - Route: Take 1 tablet (80 mg total) by mouth daily. - Oral  Sent to pharmacy as: atorvastatin (LIPITOR) 80 MG tablet  E-Prescribing Status: Receipt confirmed by pharmacy (03/20/2018  1:08 PM EDT)  Pharmacy  [facility] [place], [place] - [address]  lisinopril (PRINIVIL,ZESTRIL) 20 MG tablet 90 tablet 0 03/20/2018   Sig - Route: Take 1 tablet (20 mg total) by mouth daily. - Oral  Sent to pharmacy as: lisinopril (PRINIVIL,ZESTRIL) 20 MG tablet  E-Prescribing Status: Receipt confirmed by pharmacy (03/20/2018  1:08 PM EDT)  Pharmacy  [facility] [place], [place]

## 2018-03-29 ENCOUNTER — Other Ambulatory Visit: Payer: Self-pay

## 2018-03-29 ENCOUNTER — Ambulatory Visit: Payer: Medicare HMO | Attending: Pain Medicine | Admitting: Pain Medicine

## 2018-03-29 ENCOUNTER — Encounter: Payer: Self-pay | Admitting: Pain Medicine

## 2018-03-29 VITALS — BP 135/59 | HR 68 | Temp 97.9°F | Resp 18 | Ht 69.0 in | Wt 195.0 lb

## 2018-03-29 DIAGNOSIS — Z87891 Personal history of nicotine dependence: Secondary | ICD-10-CM | POA: Insufficient documentation

## 2018-03-29 DIAGNOSIS — M479 Spondylosis, unspecified: Secondary | ICD-10-CM | POA: Diagnosis not present

## 2018-03-29 DIAGNOSIS — M47816 Spondylosis without myelopathy or radiculopathy, lumbar region: Secondary | ICD-10-CM | POA: Diagnosis not present

## 2018-03-29 DIAGNOSIS — G894 Chronic pain syndrome: Secondary | ICD-10-CM | POA: Diagnosis present

## 2018-03-29 DIAGNOSIS — I2510 Atherosclerotic heart disease of native coronary artery without angina pectoris: Secondary | ICD-10-CM | POA: Insufficient documentation

## 2018-03-29 DIAGNOSIS — Z952 Presence of prosthetic heart valve: Secondary | ICD-10-CM | POA: Diagnosis not present

## 2018-03-29 DIAGNOSIS — I252 Old myocardial infarction: Secondary | ICD-10-CM | POA: Diagnosis not present

## 2018-03-29 DIAGNOSIS — G8929 Other chronic pain: Secondary | ICD-10-CM

## 2018-03-29 DIAGNOSIS — G4733 Obstructive sleep apnea (adult) (pediatric): Secondary | ICD-10-CM | POA: Diagnosis not present

## 2018-03-29 DIAGNOSIS — M533 Sacrococcygeal disorders, not elsewhere classified: Secondary | ICD-10-CM

## 2018-03-29 DIAGNOSIS — Z7902 Long term (current) use of antithrombotics/antiplatelets: Secondary | ICD-10-CM | POA: Diagnosis not present

## 2018-03-29 DIAGNOSIS — I739 Peripheral vascular disease, unspecified: Secondary | ICD-10-CM | POA: Diagnosis not present

## 2018-03-29 DIAGNOSIS — E785 Hyperlipidemia, unspecified: Secondary | ICD-10-CM | POA: Diagnosis not present

## 2018-03-29 DIAGNOSIS — M48061 Spinal stenosis, lumbar region without neurogenic claudication: Secondary | ICD-10-CM | POA: Diagnosis not present

## 2018-03-29 DIAGNOSIS — M79604 Pain in right leg: Secondary | ICD-10-CM

## 2018-03-29 DIAGNOSIS — Z7982 Long term (current) use of aspirin: Secondary | ICD-10-CM | POA: Diagnosis not present

## 2018-03-29 DIAGNOSIS — Z7901 Long term (current) use of anticoagulants: Secondary | ICD-10-CM | POA: Diagnosis not present

## 2018-03-29 DIAGNOSIS — M5136 Other intervertebral disc degeneration, lumbar region: Secondary | ICD-10-CM

## 2018-03-29 DIAGNOSIS — M5442 Lumbago with sciatica, left side: Secondary | ICD-10-CM | POA: Diagnosis not present

## 2018-03-29 DIAGNOSIS — M5116 Intervertebral disc disorders with radiculopathy, lumbar region: Secondary | ICD-10-CM | POA: Diagnosis not present

## 2018-03-29 DIAGNOSIS — Z79899 Other long term (current) drug therapy: Secondary | ICD-10-CM | POA: Diagnosis not present

## 2018-03-29 DIAGNOSIS — I10 Essential (primary) hypertension: Secondary | ICD-10-CM | POA: Diagnosis not present

## 2018-03-29 DIAGNOSIS — K219 Gastro-esophageal reflux disease without esophagitis: Secondary | ICD-10-CM | POA: Diagnosis not present

## 2018-03-29 DIAGNOSIS — M79605 Pain in left leg: Secondary | ICD-10-CM | POA: Diagnosis not present

## 2018-03-29 DIAGNOSIS — Z951 Presence of aortocoronary bypass graft: Secondary | ICD-10-CM | POA: Insufficient documentation

## 2018-03-29 DIAGNOSIS — M5410 Radiculopathy, site unspecified: Secondary | ICD-10-CM | POA: Diagnosis not present

## 2018-03-29 DIAGNOSIS — M5441 Lumbago with sciatica, right side: Secondary | ICD-10-CM | POA: Diagnosis not present

## 2018-03-29 DIAGNOSIS — I350 Nonrheumatic aortic (valve) stenosis: Secondary | ICD-10-CM | POA: Diagnosis not present

## 2018-03-29 DIAGNOSIS — D696 Thrombocytopenia, unspecified: Secondary | ICD-10-CM | POA: Diagnosis not present

## 2018-03-29 NOTE — Progress Notes (Signed)
Patient's Name: Matthew [name]  MRN: [id number]  Referring Provider: Antionette Char, MD  DOB: 04-22-1936  PCP: Antionette Char, MD  DOS: 03/29/2018  Note by: Gaspar Cola, MD  Service setting: Ambulatory outpatient  Specialty: Interventional Pain Management  Location: [facility] (AMB) Pain Management Facility    Patient type: Established   Primary Reason(s) for Visit: Encounter for post-procedure evaluation of chronic illness with mild to moderate exacerbation CC: Back Pain (low)  HPI  Mr. Mondor is a 82 y.o. year old, male patient, who comes today for a post-procedure evaluation. He has HLD (hyperlipidemia); Obstructive sleep apnea; Essential hypertension; Coronary atherosclerosis; Aortic valve Replacement; Angina pectoris ([place]); PAD (peripheral artery disease) ([place]); Effort angina ([place]); Coronary artery disease; Thrombocytopenia ([place]); Severe aortic stenosis; Long term current use of opiate analgesic; Long term prescription opiate use; Opiate use; Chronic low back pain (Primary Area of Pain) (Bilateral) (L>R); Chronic lower extremity pain (Bilateral) (L>R); Chronic pain syndrome; Chronic sacroiliac joint pain (Bilateral) (L>R); Chronic hip pain; Long term current use of anticoagulant (Plavix); History of MI (myocardial infarction) (Last 2004); History of alcoholism ([place]); Neurogenic pain; Lumbar facet syndrome (Bilateral) (L>R); DDD (degenerative disc disease), lumbar; Lumbar facet osteoarthritis; Lumbar spondylosis with radiculopathy; Gastroesophageal reflux disease without esophagitis; Chronic lower extremity radicular pain (S1 dermatome) (Bilateral) (L>R); Lumbar spinal stenosis; Abnormal MRI, lumbar spine; and Chronic lumbar radiculopathy on their problem list. His primarily concern today is the Back Pain (low)  Pain Assessment: Location: Left Back Radiating: radiates down left leg to calf Onset: More than a month ago Duration: Chronic pain Quality: Aching Severity: 0-No pain/10 (self-reported  pain score)  Note: Reported level is compatible with observation.                         When using our objective Pain Scale, levels between 6 and 10/10 are said to belong in an emergency room, as it progressively worsens from a 6/10, described as severely limiting, requiring emergency care not usually available at an outpatient pain management facility. At a 6/10 level, communication becomes difficult and requires great effort. Assistance to reach the emergency department may be required. Facial flushing and profuse sweating along with potentially dangerous increases in heart rate and blood pressure will be evident. Effect on ADL:   Timing: Intermittent Modifying factors: Medications  Mr. Heid comes in today for post-procedure evaluation after the treatment done on 03/09/2018.  Further details on both, my assessment(s), as well as the proposed treatment plan, please see below.  Post-Procedure Assessment  03/09/2018 Procedure: Therapeutic left L4-5 interlaminar lumbar epidural steroid injection #2 under fluoroscopic guidance, no sedation Pre-procedure pain score:  2/10 Post-procedure pain score: 0/10 (100% relief) Influential Factors: BMI: 28.80 kg/m Intra-procedural challenges: None observed.         Assessment challenges: None detected.              Reported side-effects: None.        Post-procedural adverse reactions or complications: None reported         Sedation: No sedation used. When no sedatives are used, the analgesic levels obtained are directly associated to the effectiveness of the local anesthetics. However, when sedation is provided, the level of analgesia obtained during the initial 1 hour following the intervention, is believed to be the result of a combination of factors. These factors may include, but are not limited to: 1. The effectiveness of the local anesthetics used. 2. The effects of the analgesic(s)  and/or anxiolytic(s) used. 3. The degree of discomfort experienced  by the patient at the time of the procedure. 4. The patients ability and reliability in recalling and recording the events. 5. The presence and influence of possible secondary gains and/or psychosocial factors. Reported result: Relief experienced during the 1st hour after the procedure: 100 % (Ultra-Short Term Relief)            Interpretative annotation: Clinically appropriate result. No IV Analgesic or Anxiolytic given, therefore benefits are completely due to Local Anesthetic effects.          Effects of local anesthetic: The analgesic effects attained during this period are directly associated to the localized infiltration of local anesthetics and therefore cary significant diagnostic value as to the etiological location, or anatomical origin, of the pain. Expected duration of relief is directly dependent on the pharmacodynamics of the local anesthetic used. Long-acting (4-6 hours) anesthetics used.  Reported result: Relief during the next 4 to 6 hour after the procedure: 100 % (Short-Term Relief)            Interpretative annotation: Clinically appropriate result. Analgesia during this period is likely to be Local Anesthetic-related.          Long-term benefit: Defined as the period of time past the expected duration of local anesthetics (1 hour for short-acting and 4-6 hours for long-acting). With the possible exception of prolonged sympathetic blockade from the local anesthetics, benefits during this period are typically attributed to, or associated with, other factors such as analgesic sensory neuropraxia, antiinflammatory effects, or beneficial biochemical changes provided by agents other than the local anesthetics.  Reported result: Extended relief following procedure: 85 % (Long-Term Relief)            Interpretative annotation: Clinically appropriate result. Good relief. No permanent benefit expected. Inflammation plays a part in the etiology to the pain.          Current benefits: Defined  as reported results that persistent at this point in time.   Analgesia: 75-100 % Mr. Hanawalt reports that both, extremity and the axial pain improved with the treatment. Function: Mr. Roots reports improvement in function ROM: Mr. Curley reports improvement in ROM Interpretative annotation: Ongoing benefit. Therapeutic success. Effective therapeutic approach.          Interpretation: Results would suggest a successful diagnostic intervention.                  Plan:  Set up procedure as a PRN palliative treatment option for this patient.                Laboratory Chemistry  Inflammation Markers (CRP: Acute Phase) (ESR: Chronic Phase) Lab Results  Component Value Date   CRP 0.4 08/31/2017   ESRSEDRATE 3 08/31/2017                         Renal Function Markers Lab Results  Component Value Date   BUN 17 08/31/2017   CREATININE 1.03 08/31/2017   GFRAA 80 08/31/2017   GFRNONAA 69 08/31/2017                              Hepatic Function Markers Lab Results  Component Value Date   AST 19 08/31/2017   ALT 13 (L) 05/28/2016   ALBUMIN 4.6 08/31/2017   ALKPHOS 60 08/31/2017  Electrolytes Lab Results  Component Value Date   NA 141 08/31/2017   K 3.5 08/31/2017   CL 101 08/31/2017   CALCIUM 9.8 08/31/2017   MG 1.8 08/31/2017                        Neuropathy Markers Lab Results  Component Value Date   VITAMINB12 416 08/31/2017   HGBA1C 5.1 05/28/2016                        Bone Pathology Markers Lab Results  Component Value Date   25OHVITD1 31 08/31/2017   25OHVITD2 <1.0 08/31/2017   25OHVITD3 31 08/31/2017                         Coagulation Parameters Lab Results  Component Value Date   INR 1.14 05/28/2016   LABPROT 14.8 05/28/2016   APTT 37 05/28/2016   PLT 78 (L) 06/03/2016                        Cardiovascular Markers Lab Results  Component Value Date   HGB 11.1 (L) 06/03/2016   HCT 35.1 (L) 06/03/2016                         Note:  Lab results reviewed.  Recent Diagnostic Imaging Results  DG C-Arm 1-60 Min-No Report Fluoroscopy was utilized by the requesting physician.  No radiographic  interpretation.   Complexity Note: I personally reviewed the fluoroscopic imaging of the procedure.                        Meds   Current Outpatient Medications:  .  acetaminophen (TYLENOL) 325 MG tablet, Take 650 mg by mouth daily as needed for mild pain or headache. , Disp: , Rfl:  .  aspirin EC 81 MG tablet, Take 1 tablet (81 mg total) by mouth daily., Disp: 90 tablet, Rfl: 3 .  atorvastatin (LIPITOR) 80 MG tablet, Take 1 tablet (80 mg total) by mouth daily., Disp: 90 tablet, Rfl: 0 .  clopidogrel (PLAVIX) 75 MG tablet, TAKE 1 TABLET BY MOUTH  DAILY WITH BREAKFAST, Disp: 90 tablet, Rfl: 1 .  gabapentin (NEURONTIN) 100 MG capsule, Take 1-3 capsules (100-300 mg total) 4 (four) times daily by mouth. Follow written titration schedule., Disp: 360 capsule, Rfl: 2 .  hydrochlorothiazide (HYDRODIURIL) 25 MG tablet, Take 25 mg by mouth daily., Disp: , Rfl:  .  isosorbide dinitrate (ISORDIL) 10 MG tablet, Take 20 mg by mouth every morning. Take 10 mg by mouth in the afternoon, Disp: , Rfl:  .  lisinopril (PRINIVIL,ZESTRIL) 20 MG tablet, Take 1 tablet (20 mg total) by mouth daily., Disp: 90 tablet, Rfl: 0 .  metoprolol tartrate (LOPRESSOR) 25 MG tablet, Take 0.5 tablets (12.5 mg total) by mouth daily., Disp: 45 tablet, Rfl: 0 .  Multiple Vitamins-Minerals (PRESERVISION AREDS PO), Take 1 tablet by mouth 2 (two) times daily., Disp: , Rfl:  .  nitroGLYCERIN (NITROSTAT) 0.4 MG SL tablet, Place 0.4 mg under the tongue every 5 (five) minutes as needed for chest pain., Disp: , Rfl:  .  pantoprazole (PROTONIX) 40 MG tablet, Take 20 mg by mouth daily., Disp: , Rfl:  .  tamsulosin (FLOMAX) 0.4 MG CAPS capsule, Take 1 capsule (0.4 mg total) by mouth daily., Disp: 30 capsule, Rfl: 3 .  traMADol (ULTRAM) 50 MG tablet, Take 1 tablet (50 mg total) daily by  mouth., Disp: 90 tablet, Rfl: 0 .  TURMERIC PO, Take at bedtime by mouth., Disp: , Rfl:   ROS  Constitutional: Denies any fever or chills Gastrointestinal: No reported hemesis, hematochezia, vomiting, or acute GI distress Musculoskeletal: Denies any acute onset joint swelling, redness, loss of ROM, or weakness Neurological: No reported episodes of acute onset apraxia, aphasia, dysarthria, agnosia, amnesia, paralysis, loss of coordination, or loss of consciousness  Allergies  Mr. Amick has No Known Allergies.  PFSH  Drug: Mr. Debold  reports that he does not use drugs. Alcohol:  reports that he does not drink alcohol. Tobacco:  reports that he quit smoking about 28 years ago. His smoking use included cigarettes. He has a 50.00 pack-year smoking history. He has never used smokeless tobacco. Medical:  has a past medical history of AS (aortic stenosis), CAD (coronary artery disease), Carotid bruit, GERD (gastroesophageal reflux disease), History of kidney stones, HOH (hard of hearing), HTN (hypertension), Hyperlipidemia, Myocardial infarction ([place]) (1996), Nephrolithiasis, OSA (obstructive sleep apnea), PAD (peripheral artery disease) ([place]) (02/2014), Shortness of breath, and Thrombocytopenia ([place]). Surgical: Mr. Lessley  has a past surgical history that includes Stomach surgery; Coronary artery bypass graft (1991); Inguinal hernia repair (11/10/2011); left heart catheterization with coronary/graft angiogram (N/A, 03/06/2014); abdominal aortagram (N/A, 03/06/2014); Cardiac catheterization (02/2014); Cardiac catheterization (N/A, 07/31/2015); Cardiac catheterization (N/A, 07/31/2015); Cardiac catheterization (N/A, 05/26/2016); Cysto; Transcatheter aortic valve replacement, transfemoral (N/A, 06/01/2016); and TEE without cardioversion (N/A, 06/01/2016). Family: family history includes Heart disease in his father and mother.  Constitutional Exam  General appearance: Well nourished, well developed, and well hydrated.  In no apparent acute distress Vitals:   03/29/18 1024  BP: (!) 135/59  Pulse: 68  Resp: 18  Temp: 97.9 F (36.6 C)  SpO2: 99%  Weight: 195 lb (88.5 kg)  Height: _0  (1.753 m)   BMI Assessment: Estimated body mass index is 28.8 kg/m as calculated from the following:   Height as of this encounter: _1  (1.753 m).   Weight as of this encounter: 195 lb (88.5 kg).  BMI interpretation table: BMI level Category Range association with higher incidence of chronic pain  <18 kg/m2 Underweight   18.5-24.9 kg/m2 Ideal body weight   25-29.9 kg/m2 Overweight Increased incidence by 20%  30-34.9 kg/m2 Obese (Class I) Increased incidence by 68%  35-39.9 kg/m2 Severe obesity (Class II) Increased incidence by 136%  >40 kg/m2 Extreme obesity (Class III) Increased incidence by 254%   BMI Readings from Last 4 Encounters:  03/29/18 28.80 kg/m  03/09/18 28.80 kg/m  01/30/18 28.80 kg/m  01/12/18 28.80 kg/m   Wt Readings from Last 4 Encounters:  03/29/18 195 lb (88.5 kg)  03/09/18 195 lb (88.5 kg)  01/30/18 195 lb (88.5 kg)  01/12/18 195 lb (88.5 kg)  Psych/Mental status: Alert, oriented x 3 (person, place, & time)       Eyes: PERLA Respiratory: No evidence of acute respiratory distress  Cervical Spine Area Exam  Skin & Axial Inspection: No masses, redness, edema, swelling, or associated skin lesions Alignment: Symmetrical Functional ROM: Unrestricted ROM      Stability: No instability detected Muscle Tone/Strength: Functionally intact. No obvious neuro-muscular anomalies detected. Sensory (Neurological): Unimpaired Palpation: No palpable anomalies              Upper Extremity (UE) Exam    Side: Right upper extremity  Side: Left upper extremity  Skin & Extremity Inspection: Skin  color, temperature, and hair growth are WNL. No peripheral edema or cyanosis. No masses, redness, swelling, asymmetry, or associated skin lesions. No contractures.  Skin & Extremity Inspection: Skin color,  temperature, and hair growth are WNL. No peripheral edema or cyanosis. No masses, redness, swelling, asymmetry, or associated skin lesions. No contractures.  Functional ROM: Unrestricted ROM          Functional ROM: Unrestricted ROM          Muscle Tone/Strength: Functionally intact. No obvious neuro-muscular anomalies detected.  Muscle Tone/Strength: Functionally intact. No obvious neuro-muscular anomalies detected.  Sensory (Neurological): Unimpaired          Sensory (Neurological): Unimpaired          Palpation: No palpable anomalies              Palpation: No palpable anomalies              Specialized Test(s): Deferred         Specialized Test(s): Deferred          Thoracic Spine Area Exam  Skin & Axial Inspection: No masses, redness, or swelling Alignment: Symmetrical Functional ROM: Unrestricted ROM Stability: No instability detected Muscle Tone/Strength: Functionally intact. No obvious neuro-muscular anomalies detected. Sensory (Neurological): Unimpaired Muscle strength & Tone: No palpable anomalies  Lumbar Spine Area Exam  Skin & Axial Inspection: No masses, redness, or swelling Alignment: Symmetrical Functional ROM: Improved after treatment       Stability: No instability detected Muscle Tone/Strength: Functionally intact. No obvious neuro-muscular anomalies detected. Sensory (Neurological): Unimpaired Palpation: No palpable anomalies       Provocative Tests: Lumbar Hyperextension and rotation test: evaluation deferred today       Lumbar Lateral bending test: evaluation deferred today       Patrick's Maneuver: evaluation deferred today                    Gait & Posture Assessment  Ambulation: Unassisted Gait: Relatively normal for age and body habitus Posture: WNL   Lower Extremity Exam    Side: Right lower extremity  Side: Left lower extremity  Skin & Extremity Inspection: Skin color, temperature, and hair growth are WNL. No peripheral edema or cyanosis. No masses,  redness, swelling, asymmetry, or associated skin lesions. No contractures.  Skin & Extremity Inspection: Skin color, temperature, and hair growth are WNL. No peripheral edema or cyanosis. No masses, redness, swelling, asymmetry, or associated skin lesions. No contractures.  Functional ROM: Unrestricted ROM          Functional ROM: Unrestricted ROM          Muscle Tone/Strength: Functionally intact. No obvious neuro-muscular anomalies detected.  Muscle Tone/Strength: Functionally intact. No obvious neuro-muscular anomalies detected.  Sensory (Neurological): Unimpaired  Sensory (Neurological): Unimpaired  Palpation: No palpable anomalies  Palpation: No palpable anomalies   Assessment  Primary Diagnosis & Pertinent Problem List: The primary encounter diagnosis was Chronic low back pain (Primary Area of Pain) (Bilateral) (L>R). Diagnoses of Chronic lower extremity pain (Bilateral) (L>R), Chronic lower extremity radicular pain (S1 dermatome) (Bilateral) (L>R), Lumbar facet syndrome (Bilateral) (L>R), Chronic sacroiliac joint pain (Bilateral) (L>R), and DDD (degenerative disc disease), lumbar were also pertinent to this visit.  Status Diagnosis  Resolved Resolved Resolved 1. Chronic low back pain (Primary Area of Pain) (Bilateral) (L>R)   2. Chronic lower extremity pain (Bilateral) (L>R)   3. Chronic lower extremity radicular pain (S1 dermatome) (Bilateral) (L>R)   4. Lumbar facet syndrome (Bilateral) (L>R)  5. Chronic sacroiliac joint pain (Bilateral) (L>R)   6. DDD (degenerative disc disease), lumbar     Problems updated and reviewed during this visit: No problems updated. Plan of Care  Pharmacotherapy (Medications Ordered): No orders of the defined types were placed in this encounter.  Medications administered today: Nehemiah Settle. Friedlander had no medications administered during this visit.   Procedure Orders     Lumbar Epidural Injection Lab Orders  No laboratory test(s) ordered today    Imaging Orders  No imaging studies ordered today   Referral Orders  No referral(s) requested today    Interventional management options: Planned, scheduled, and/or pending:  NOTE:Stop Plavix for7 days forprocedures. PRN: Therapeutic left L4-5 interlaminar lumbar epidural steroid injection #2 under fluoroscopic guidance, no sedation   Considering:   Diagnostic bilateral lumbar facet block + bilateral sacroiliac joint block #2 Possible bilateral lumbar facet RFA + bilateral sacroiliac joint RFA Diagnostic left-sided L4-5 interlaminar lumbar epidural steroid injection Diagnostic left-sided L2-3 transforaminal epidural steroid injection Diagnostic bilateral L3-4 transforaminal epidural steroid injections Diagnostic bilateral hip injections   Palliative PRN treatment(s):   Diagnostic left L4-5 interlaminar LESI    Provider-requested follow-up: Return for PRN Procedure (w/o sedation): (L) L4-5 LESI #3.  Future Appointments  Date Time Provider [place]  04/11/2018  2:30 PM Rise Mu, PA-C [id number]   Primary Care Physician: Antionette Char, MD Location: [facility] Out[facility] Management Facility Note by: Gaspar Cola, MD Date: 03/29/2018; Time: 11:03 AM

## 2018-03-29 NOTE — Patient Instructions (Signed)
____________________________________________________________________________________________  Preparing for your procedure (without sedation)  Instructions: . Oral Intake: Do not eat or drink anything for at least 3 hours prior to your procedure. . Transportation: Unless otherwise stated by your physician, you may drive yourself after the procedure. . Blood Pressure Medicine: Take your blood pressure medicine with a sip of water the morning of the procedure. . Blood thinners:  . Diabetics on insulin: Notify the staff so that you can be scheduled 1st case in the morning. If your diabetes requires high dose insulin, take only  of your normal insulin dose the morning of the procedure and notify the staff that you have done so. . Preventing infections: Shower with an antibacterial soap the morning of your procedure.  . Build-up your immune system: Take 1000 mg of Vitamin C with every meal (3 times a day) the day prior to your procedure. . Antibiotics: Inform the staff if you have a condition or reason that requires you to take antibiotics before dental procedures. . Pregnancy: If you are pregnant, call and cancel the procedure. . Sickness: If you have a cold, fever, or any active infections, call and cancel the procedure. . Arrival: You must be in the facility at least 30 minutes prior to your scheduled procedure. . Children: Do not bring any children with you. . Dress appropriately: Bring dark clothing that you would not mind if they get stained. . Valuables: Do not bring any jewelry or valuables.  Procedure appointments are reserved for interventional treatments only. . No Prescription Refills. . No medication changes will be discussed during procedure appointments. . No disability issues will be discussed.  Remember:  Regular Business hours are:  Monday to Thursday 8:00 AM to 4:00 PM  Provider's Schedule: Kaylor Simenson, MD:  Procedure days: Tuesday and Thursday 7:30 AM to 4:00  PM  Bilal Lateef, MD:  Procedure days: Monday and Wednesday 7:30 AM to 4:00 PM ____________________________________________________________________________________________    

## 2018-03-29 NOTE — Progress Notes (Signed)
Safety precautions to be maintained throughout the outpatient stay will include: orient to surroundings, keep bed in low position, maintain call bell within reach at all times, provide assistance with transfer out of bed and ambulation.  

## 2018-04-07 ENCOUNTER — Telehealth: Payer: Self-pay | Admitting: Cardiovascular Disease

## 2018-04-07 NOTE — Progress Notes (Signed)
Cardiology Office Note Date:  04/11/2018  Patient ID:  Matthew [name], Matthew [name] Jun 26, 1936, MRN [id number] PCP:  Antionette Char, MD  Cardiologist:  Dr. Fletcher Anon, MD    Chief Complaint: Follow-up  History of Present Illness: Matthew [name] is a 82 y.o. male with history of CAD s/p CABG in 1991 with redo bypass in 2004, aortic stenosis s/p TAVR in 02/5455, systolic dysfunction, chronic diastolic CHF, stable angina, PAD with bilateral common iliac artery kissing stenting in 2015, HTN, OSA, and GERD who presents for follow-up of his CAD.  Most recent R/LHC in 05/2016, prior to his TAVR, showed severe underlying 3-vessel CAD with patent grafts (proximal LAD 100% stenosed, mid LCx 100% stenosed, OM2 80% stenosed, proximal RCA 100% stenosed, LIMA-LAD was unable to be cannulated and was anatomically normal and normal in caliber, RIMA-D2 was visualized by non-selective angiography and was anatomically normal, VG-OM2 with a patent, previously placed proximal graft stent, 50% mid VG-OM2 stenosis, VG-OM3 minimal luminal irregularities, VG-RPDA mild diffuse disease), RHC showed high normal pulmonary pressure and filling pressure, normal cardiac output. Continued medical management was advised. At his follow up with Dr. Fletcher Anon in 05/2017 he was doing well, though continued to note intermittent episodes of substernal chest tightness when he was late with taking his medications. Most recent TTE from 05/2017 showed an EF of 45-50%, mild concentric LVH, Gr1DD, septal dyssnergy, TAVR prosthesis was noted and functioning normally, moderately to severely dilated left atrium, mildly dilated right atrium. When compared to prior TTE from 06/2016 his EF had dropped from 55% and the LV was more dilated.   He comes in today continuing to note intermittent chest pain if he forgets to take his morning medications by 8 AM.  He reports if he takes his morning medications by 8 AM followed by his breakfast he is asymptomatic.  He also notes  issues of intermittent chest pain in the evenings if he is late taking his medications or if he does not take them with a meal.  However, he does report issues with exertional chest pain when attempting to weed eat the bank of 1 of his properties by the road.  Anytime he attempts to trim the grass in this area he is forced to stop and rest secondary to increased fatigue, chest pain, and back pain.  He denies any symptoms of claudication.   Past Medical History:  Diagnosis Date  . AS (aortic stenosis)    a. Echo 6/10: EF 55% mild AS; b. echo 06/2015; EF 55-60%, GR1DD, moderate AS, Peak velocity (S): 346 cm/s. Mean gradientS): 28 mm Hg. Peak gradient (S): 48 mm Hg. Valve area (VTI): 1.18 cm2;   c. Echo 4/17 - mild LVH, EF 55-60%, no RWMA, Gr 1 DD, mod to severe AS (mean 28 mmHg, peak 46 mmHg), mild LAE  . CAD (coronary artery disease)    a. MI 1996 w/ CABG 1996; b.redo CABG 06/2003; c. Myoview 7/09: EF 54% inferobasal infarct, no ischemia. Myoview 6/10 EF 43% inf wall infarct. no ischemia; c. cath 8/16 s/p DES to VG-OM2. OTW 3VD w/ patent VG->RPDA, VG->OM3,  & LIMA->LAD.  EF 55-65%.  . Carotid bruit    2009 0-39% on dopplers bilatrally  . GERD (gastroesophageal reflux disease)   . History of kidney stones   . HOH (hard of hearing)   . HTN (hypertension)   . Hyperlipidemia   . Myocardial infarction ([place]) 1996  . Nephrolithiasis   . OSA (obstructive sleep apnea)   .  PAD (peripheral artery disease) ([place]) 02/2014   Subtotal occlusion of right common iliac artery and 70% stenosis in the left common iliac artery. Status post bilateral kissing stent placement. 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 Shortness of breath    with exertion  . Thrombocytopenia ([place])     Past Surgical History:  Procedure Laterality Date  . ABDOMINAL  AORTAGRAM N/A 03/06/2014   Procedure: ABDOMINAL Maxcine Ham;  Surgeon: Wellington Hampshire, MD;  Location: [facility];  Service: Cardiovascular;  Laterality: N/A;  . CARDIAC CATHETERIZATION  02/2014   Severe three-vessel coronary artery disease with patent grafts.   Marland Kitchen CARDIAC CATHETERIZATION N/A 07/31/2015   Procedure: Right/Left Heart Cath and Coronary/Graft Angiography;  Surgeon: Wellington Hampshire, MD;  Location: [facility];  Service: Cardiovascular;  Laterality: N/A;  . CARDIAC CATHETERIZATION N/A 07/31/2015   Procedure: Coronary Stent Intervention;  Surgeon: Wellington Hampshire, MD;  Location: [facility];  Service: Cardiovascular;  Laterality: N/A;  . CARDIAC CATHETERIZATION N/A 05/26/2016   Procedure: Right/Left Heart Cath and Coronary Angiography;  Surgeon: Wellington Hampshire, MD;  Location: [facility];  Service: Cardiovascular;  Laterality: N/A;  . CORONARY ARTERY BYPASS GRAFT  1991   at [facility]. Redone 2004-3 vessels 1st time and 4 second time  . CYSTO     2/3 times for kidney stones  . INGUINAL HERNIA REPAIR  11/10/2011   Procedure: HERNIA REPAIR INGUINAL ADULT;  Surgeon: Donato Heinz;  Location: [facility];  Service: General;  Laterality: Right;  . LEFT HEART CATHETERIZATION WITH CORONARY/GRAFT ANGIOGRAM N/A 03/06/2014   Procedure: LEFT HEART CATHETERIZATION WITH Beatrix Fetters;  Surgeon: Wellington Hampshire, MD;  Location: [facility];  Service: Cardiovascular;  Laterality: N/A;  . STOMACH SURGERY     removal of gastric ulcers  . TEE WITHOUT CARDIOVERSION N/A 06/01/2016   Procedure: TRANSESOPHAGEAL ECHOCARDIOGRAM (TEE);  Surgeon: Sherren Mocha, MD;  Location: [facility];  Service: Open Heart Surgery;  Laterality: N/A;  . TRANSCATHETER AORTIC VALVE REPLACEMENT, TRANSFEMORAL N/A 06/01/2016   Procedure: TRANSCATHETER AORTIC VALVE REPLACEMENT, TRANSFEMORAL;  Surgeon: Sherren Mocha, MD;  Location: [facility];  Service: Open Heart Surgery;  Laterality: N/A;    No outpatient  medications have been marked as taking for the 04/11/18 encounter (Office Visit) with Rise Mu, PA-C.    Allergies:   Patient has no known allergies.   Social History:  The patient  reports that he quit smoking about 28 years ago. His smoking use included cigarettes. He has a 50.00 pack-year smoking history. He has never used smokeless tobacco. He reports that he does not drink alcohol or use drugs.   Family History:  The patient's family history includes Heart disease in his father and mother.  ROS:   Review of Systems  Constitutional: Positive for malaise/fatigue. Negative for chills, diaphoresis, fever and weight loss.  HENT: Negative for congestion.   Eyes: Negative for discharge and redness.  Respiratory: Positive for shortness of breath. Negative for cough, hemoptysis, sputum production and wheezing.   Cardiovascular: Positive for chest pain. Negative for palpitations, orthopnea, claudication, leg swelling and PND.  Gastrointestinal: Negative for abdominal pain, blood in stool, heartburn, melena, nausea and vomiting.  Genitourinary: Negative for hematuria.  Musculoskeletal: Positive for back pain. Negative for falls and myalgias.  Skin: Negative for rash.  Neurological: Positive  for weakness. Negative for dizziness, tingling, tremors, sensory change, speech change, focal weakness and loss of consciousness.  Endo/Heme/Allergies: Does not bruise/bleed easily.  Psychiatric/Behavioral: Negative for substance abuse. The patient is not nervous/anxious.   All other systems reviewed and are negative.    PHYSICAL EXAM:  VS:  BP (!) 110/52 (BP Location: Left Arm, Patient Position: Sitting, Cuff Size: Normal)   Pulse 68   Ht 5\' 9"  (1.753 m)   Wt 192 lb (87.1 kg)   BMI 28.35 kg/m  BMI: Body mass index is 28.35 kg/m.  Physical Exam  Constitutional: He is oriented to person, place, and time. He appears well-developed and well-nourished.  HENT:  Head: Normocephalic and atraumatic.    Eyes: Right eye exhibits no discharge. Left eye exhibits no discharge.  Neck: Normal range of motion. No JVD present.  Cardiovascular: Normal rate, regular rhythm, S1 normal and S2 normal. Exam reveals no distant heart sounds, no friction rub, no midsystolic click and no opening snap.  Murmur heard.  Harsh midsystolic murmur is present with a grade of 2/6 at the upper right sternal border radiating to the neck. Pulses:      Radial pulses are 2+ on the right side, and 2+ on the left side.       Posterior tibial pulses are 2+ on the right side, and 2+ on the left side.  Pulmonary/Chest: Effort normal and breath sounds normal. No respiratory distress. He has no decreased breath sounds. He has no wheezes. He has no rales. He exhibits no tenderness.  Abdominal: Soft. He exhibits no distension. There is no tenderness.  Musculoskeletal: He exhibits no edema.  Neurological: He is alert and oriented to person, place, and time.  Skin: Skin is warm and dry. No cyanosis. Nails show no clubbing.  Psychiatric: He has a normal mood and affect. His speech is normal and behavior is normal. Judgment and thought content normal.     EKG:  Was ordered and interpreted by me today. Shows NSR, 68 bpm, left axis deviation, left bundle branch block (known)  Recent Labs: 08/31/2017: BUN 17; Creatinine, Ser 1.03; Magnesium 1.8; Potassium 3.5; Sodium 141  No results found for requested labs within last 8760 hours.   CrCl cannot be calculated (Patient's most recent lab result is older than the maximum 21 days allowed.).   Wt Readings from Last 3 Encounters:  04/11/18 192 lb (87.1 kg)  03/29/18 195 lb (88.5 kg)  03/09/18 195 lb (88.5 kg)     Other studies reviewed: Additional studies/records reviewed today include: summarized above  ASSESSMENT AND PLAN:  1. CAD involving bypass grafts with stable angina: Currently symptom-free.  He has a long history of stable angina that has been associated with taking  medications late or without food.  He reports upon taking his regularly scheduled medications and his symptoms resolved.  However, he does report exertional chest pain when attempting to do vigorous yard work.  Due to this chest pain and his echocardiogram in 05/2017 demonstrating a mild reduction in LV systolic function we will schedule the patient for a Lexiscan Myoview to evaluate for high risk ischemia.  Otherwise, he will continue aspirin 81 mg daily and Plavix 75 mg daily.  Aggressive risk factor modification and secondary prevention.  2. Chronic systolic CHF due to ischemic cardiomyopathy: He does not appear grossly volume overloaded.  Most recent echocardiogram from 05/2017 showed a mildly reduced LV systolic function along with a mildly dilated LV when compared to prior echo.  Given  this mild reduction in LV systolic function, coupled with his angina, we will schedule the patient for a Lexiscan Myoview to evaluate for high risk ischemia.  Continue lisinopril and Lopressor.  3. Aortic stenosis status post TAVR: Recent echo demonstrated aortic valve prosthesis functioning normally.  Continue to monitor clinically.  4. Essential hypertension: Blood pressure is well controlled today.  Continue current medications without changes.    5. Hyperlipidemia: Continue high-dose Lipitor.  Check lipids and LFT.  Followed by PCP.  Disposition: F/u with myself or Dr. Fletcher Anon in 1 month.  Current medicines are reviewed at length with the patient today.  The patient did not have any concerns regarding medicines.  Signed, Christell Faith, PA-C 04/11/2018 2:29 PM     [address] [place], [place] [address] [phone]

## 2018-04-07 NOTE — Telephone Encounter (Signed)
Spoke with patients daughter just wanted [place] to reinforce that he should eat with his daily pills and that he is having more chest pain or tightness in his chest. So he takes aspirin and he gets some pain relief. 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 She states that when he exerts himself that is when he hurts and he just keeps going. So she wanted [place] to really address his medications, suggestions, or testing. Advised that I would pass this information on to the provider. Instructed her to call back if she should have any other concerns or questions. She verbalized understanding with no further questions at this time.

## 2018-04-07 NOTE — Telephone Encounter (Signed)
Pt c/o of Chest Pain: STAT if CP now or developed within 24 hours  1. Are you having CP right now? Unknown - spoke with Daughter   2. Are you experiencing any other symptoms (ex. SOB, nausea, vomiting, sweating)?  Nausea after taking morning medication 4- 6 am doesn't eat after taking and goes back to bed   3. How long have you been experiencing CP?  Since christmas  4. Is your CP continuous or coming and going? Comes and Goes   5. Have you taken Nitroglycerin? No gives a headache same result with ASA  ?

## 2018-04-11 ENCOUNTER — Encounter: Payer: Self-pay | Admitting: Physician Assistant

## 2018-04-11 ENCOUNTER — Ambulatory Visit: Payer: Medicare HMO | Admitting: Physician Assistant

## 2018-04-11 VITALS — BP 110/52 | HR 68 | Ht 69.0 in | Wt 192.0 lb

## 2018-04-11 DIAGNOSIS — I10 Essential (primary) hypertension: Secondary | ICD-10-CM

## 2018-04-11 DIAGNOSIS — I5022 Chronic systolic (congestive) heart failure: Secondary | ICD-10-CM | POA: Diagnosis not present

## 2018-04-11 DIAGNOSIS — I25118 Atherosclerotic heart disease of native coronary artery with other forms of angina pectoris: Secondary | ICD-10-CM

## 2018-04-11 DIAGNOSIS — E785 Hyperlipidemia, unspecified: Secondary | ICD-10-CM | POA: Diagnosis not present

## 2018-04-11 DIAGNOSIS — I350 Nonrheumatic aortic (valve) stenosis: Secondary | ICD-10-CM | POA: Diagnosis not present

## 2018-04-11 DIAGNOSIS — Z953 Presence of xenogenic heart valve: Secondary | ICD-10-CM | POA: Diagnosis not present

## 2018-04-11 MED ORDER — HYDROCHLOROTHIAZIDE 25 MG PO TABS: 25.0000 mg | ORAL_TABLET | Freq: Every day | ORAL | 3 refills | Status: DC

## 2018-04-11 MED ORDER — CLOPIDOGREL BISULFATE 75 MG PO TABS: 75.0000 mg | ORAL_TABLET | Freq: Every day | ORAL | 3 refills | Status: DC

## 2018-04-11 MED ORDER — ISOSORBIDE DINITRATE 10 MG PO TABS: 20.0000 mg | ORAL_TABLET | ORAL | 3 refills | Status: DC

## 2018-04-11 MED ORDER — LISINOPRIL 20 MG PO TABS: 20.0000 mg | ORAL_TABLET | Freq: Every day | ORAL | 3 refills | Status: DC

## 2018-04-11 MED ORDER — METOPROLOL TARTRATE 25 MG PO TABS: 12.5000 mg | ORAL_TABLET | Freq: Every day | ORAL | 3 refills | Status: DC

## 2018-04-11 MED ORDER — ATORVASTATIN CALCIUM 80 MG PO TABS: 80.0000 mg | ORAL_TABLET | Freq: Every day | ORAL | 3 refills | Status: DC

## 2018-04-11 NOTE — Patient Instructions (Addendum)
Medication Instructions:  Your physician recommends that you continue on your current medications as directed. Please refer to the Current Medication list given to you today.   Labwork: Your physician recommends that you return for lab work in: TODAY (CBC, CMET, LIPID)   Testing/Procedures: Your physician has requested that you have a lexiscan myoview. For further information please visit [email or web address]. Please follow instruction sheet, as given.  [place]  Your caregiver has ordered a Stress Test with nuclear imaging. The purpose of this test is to evaluate the blood supply to your heart muscle. This procedure is referred to as a "Non-Invasive Stress Test." This is because other than having an IV started in your vein, nothing is inserted or "invades" your body. Cardiac stress tests are done to find areas of poor blood flow to the heart by determining the extent of coronary artery disease (CAD). Some patients exercise on a treadmill, which naturally increases the blood flow to your heart, while others who are  unable to walk on a treadmill due to physical limitations have a pharmacologic/chemical stress agent called Lexiscan . This medicine will mimic walking on a treadmill by temporarily increasing your coronary blood flow.   Please note: these test may take anywhere between 2-4 hours to complete  PLEASE REPORT TO [place] AT THE FIRST DESK WILL DIRECT YOU WHERE TO GO  Date of Procedure:_____________________________________  Arrival Time for Procedure:______________________________  Instructions regarding medication:    _XX_:  Hold other medications as follows:_____HYDROCHLOROTHIAZIDE   PLEASE NOTIFY THE OFFICE AT LEAST 24 HOURS IN ADVANCE IF YOU ARE UNABLE TO KEEP YOUR APPOINTMENT.  [phone] AND  PLEASE NOTIFY NUCLEAR MEDICINE AT [facility] AT LEAST 24 HOURS IN ADVANCE IF YOU ARE UNABLE TO KEEP YOUR APPOINTMENT. [phone]  How to prepare  for your Myoview test:  1. Do not eat or drink after midnight 2. No caffeine for 24 hours prior to test 3. No smoking 24 hours prior to test. 4. Your medication may be taken with water.  If your doctor stopped a medication because of this test, do not take that medication. 5. Ladies, please do not wear dresses.  Skirts or pants are appropriate. Please wear a short sleeve shirt. 6. No perfume, cologne or lotion. 7. Wear comfortable walking shoes. No heels!       Follow-Up: Your physician recommends that you schedule a follow-up appointment in: [place].     If you need a refill on your cardiac medications before your next appointment, please call your pharmacy.   Cardiac Nuclear Scan A cardiac nuclear scan is a test that measures blood flow to the heart when a person is resting and when he or she is exercising. The test looks for problems such as:  Not enough blood reaching a portion of the heart.  The heart muscle not working normally.  You may need this test if:  You have heart disease.  You have had abnormal lab results.  You have had heart surgery or angioplasty.  You have chest pain.  You have shortness of breath.  In this test, a radioactive dye (tracer) is injected into your bloodstream. After the tracer has traveled to your heart, an imaging device is used to measure how much of the tracer is absorbed by or distributed to various areas of your heart. This procedure is usually done at a hospital and takes 2-4 hours. Tell a health care provider about:  Any allergies  you have.  All medicines you are taking, including vitamins, herbs, eye drops, creams, and over-the-counter medicines.  Any problems you or family members have had with the use of anesthetic medicines.  Any blood disorders you have.  Any surgeries you have had.  Any medical conditions you have.  Whether you are pregnant or may be pregnant. What are the risks? Generally,  this is a safe procedure. However, problems may occur, including:  Serious chest pain and heart attack. This is only a risk if the stress portion of the test is done.  Rapid heartbeat.  Sensation of warmth in your chest. This usually passes quickly.  What happens before the procedure?  Ask your health care provider about changing or stopping your regular medicines. This is especially important if you are taking diabetes medicines or blood thinners.  Remove your jewelry on the day of the procedure. What happens during the procedure?  An IV tube will be inserted into one of your veins.  Your health care provider will inject a small amount of radioactive tracer through the tube.  You will wait for 20-40 minutes while the tracer travels through your bloodstream.  Your heart activity will be monitored with an electrocardiogram (ECG).  You will lie down on an exam table.  Images of your heart will be taken for about 15-20 minutes.  You may be asked to exercise on a treadmill or stationary bike. While you exercise, your heart's activity will be monitored with an ECG, and your blood pressure will be checked. If you are unable to exercise, you may be given a medicine to increase blood flow to parts of your heart.  When blood flow to your heart has peaked, a tracer will again be injected through the IV tube.  After 20-40 minutes, you will get back on the exam table and have more images taken of your heart.  When the procedure is over, your IV tube will be removed. The procedure may vary among health care providers and hospitals. Depending on the type of tracer used, scans may need to be repeated 3-4 hours later. What happens after the procedure?  Unless your health care provider tells you otherwise, you may return to your normal schedule, including diet, activities, and medicines.  Unless your health care provider tells you otherwise, you may increase your fluid intake. This will help  flush the contrast dye from your body. Drink enough fluid to keep your urine clear or pale yellow.  It is up to you to get your test results. Ask your health care provider, or the department that is doing the test, when your results will be ready. Summary  A cardiac nuclear scan measures the blood flow to the heart when a person is resting and when he or she is exercising.  You may need this test if you are at risk for heart disease.  Tell your health care provider if you are pregnant.  Unless your health care provider tells you otherwise, increase your fluid intake. This will help flush the contrast dye from your body. Drink enough fluid to keep your urine clear or pale yellow. This information is not intended to replace advice given to you by your health care provider. Make sure you discuss any questions you have with your health care provider. Document Released: 12/24/2004 Document Revised: 12/01/2016 Document Reviewed: 11/07/2013 Elsevier Interactive Patient Education  2017 [employer].

## 2018-04-12 LAB — CBC WITH DIFFERENTIAL/PLATELET
BASOS ABS: 0 10*3/uL (ref 0.0–0.2)
Basos: 1 %
EOS (ABSOLUTE): 0.1 10*3/uL (ref 0.0–0.4)
Eos: 2 %
Hematocrit: 33.7 % — ABNORMAL LOW (ref 37.5–51.0)
Hemoglobin: 11.4 g/dL — ABNORMAL LOW (ref 13.0–17.7)
IMMATURE GRANS (ABS): 0 10*3/uL (ref 0.0–0.1)
IMMATURE GRANULOCYTES: 0 %
LYMPHS: 22 %
Lymphocytes Absolute: 1.1 10*3/uL (ref 0.7–3.1)
MCH: 30.1 pg (ref 26.6–33.0)
MCHC: 33.8 g/dL (ref 31.5–35.7)
MCV: 89 fL (ref 79–97)
MONOS ABS: 0.5 10*3/uL (ref 0.1–0.9)
Monocytes: 10 %
NEUTROS PCT: 65 %
Neutrophils Absolute: 3.3 10*3/uL (ref 1.4–7.0)
Platelets: 154 10*3/uL (ref 150–379)
RBC: 3.79 x10E6/uL — ABNORMAL LOW (ref 4.14–5.80)
RDW: 13.1 % (ref 12.3–15.4)
WBC: 5.1 10*3/uL (ref 3.4–10.8)

## 2018-04-12 LAB — COMPREHENSIVE METABOLIC PANEL
A/G RATIO: 2.2 (ref 1.2–2.2)
ALT: 14 IU/L (ref 0–44)
AST: 13 IU/L (ref 0–40)
Albumin: 4.3 g/dL (ref 3.5–4.8)
Alkaline Phosphatase: 59 IU/L (ref 39–117)
BILIRUBIN TOTAL: 0.8 mg/dL (ref 0.0–1.2)
BUN/Creatinine Ratio: 18 (ref 10–24)
BUN: 22 mg/dL (ref 8–27)
CALCIUM: 9.4 mg/dL (ref 8.6–10.2)
CHLORIDE: 103 mmol/L (ref 96–106)
CO2: 21 mmol/L (ref 20–29)
Creatinine, Ser: 1.19 mg/dL (ref 0.76–1.27)
GFR calc non Af Amer: 58 mL/min/{1.73_m2} — ABNORMAL LOW (ref >59)
GFR, EST AFRICAN AMERICAN: 67 mL/min/{1.73_m2} (ref >59)
GLUCOSE: 102 mg/dL — AB (ref 65–99)
Globulin, Total: 2 g/dL (ref 1.5–4.5)
POTASSIUM: 4.4 mmol/L (ref 3.5–5.2)
Sodium: 137 mmol/L (ref 134–144)
TOTAL PROTEIN: 6.3 g/dL (ref 6.0–8.5)

## 2018-04-12 LAB — LIPID PANEL
Chol/HDL Ratio: 3.6 ratio (ref 0.0–5.0)
Cholesterol, Total: 160 mg/dL (ref 100–199)
HDL: 44 mg/dL (ref >39)
LDL Calculated: 63 mg/dL (ref 0–99)
TRIGLYCERIDES: 263 mg/dL — AB (ref 0–149)
VLDL CHOLESTEROL CAL: 53 mg/dL — AB (ref 5–40)

## 2018-04-14 ENCOUNTER — Ambulatory Visit
Admission: RE | Admit: 2018-04-14 | Discharge: 2018-04-14 | Disposition: A | Discharge status: Home / Self Care | Payer: Medicare HMO | Source: Ambulatory Visit | Attending: Physician Assistant | Admitting: Physician Assistant

## 2018-04-14 DIAGNOSIS — R9439 Abnormal result of other cardiovascular function study: Secondary | ICD-10-CM | POA: Insufficient documentation

## 2018-04-14 DIAGNOSIS — I25118 Atherosclerotic heart disease of native coronary artery with other forms of angina pectoris: Secondary | ICD-10-CM

## 2018-04-14 LAB — NM MYOCAR MULTI W/SPECT W/WALL MOTION / EF
CHL CUP NUCLEAR SSS: 2
CSEPEDS: 0 s
CSEPPHR: 82 {beats}/min
Estimated workload: 1 METS
Exercise duration (min): 0 min
LV dias vol: 175 mL (ref 62–150)
LVSYSVOL: 103 mL
MPHR: 147 {beats}/min
Percent HR: 55 %
Rest HR: 46 {beats}/min
SDS: 0
SRS: 18
TID: 1.1

## 2018-04-14 MED ORDER — TECHNETIUM TC 99M TETROFOSMIN IV KIT
30.0000 | PACK | Freq: Once | INTRAVENOUS | Status: AC | PRN
  Administered 2018-04-14: 32.28 via INTRAVENOUS

## 2018-04-14 MED ORDER — TECHNETIUM TC 99M TETROFOSMIN IV KIT
10.0000 | PACK | Freq: Once | INTRAVENOUS | Status: AC | PRN
  Administered 2018-04-14: 13.109 via INTRAVENOUS

## 2018-04-14 MED ORDER — REGADENOSON 0.4 MG/5ML IV SOLN
0.4000 mg | Freq: Once | INTRAVENOUS | Status: AC
  Administered 2018-04-14: 0.4 mg via INTRAVENOUS
  Filled 2018-04-14: qty 5

## 2018-04-17 ENCOUNTER — Telehealth: Payer: Self-pay | Admitting: Cardiovascular Disease

## 2018-04-17 ENCOUNTER — Other Ambulatory Visit: Payer: Self-pay | Admitting: *Deleted

## 2018-04-17 DIAGNOSIS — R931 Abnormal findings on diagnostic imaging of heart and coronary circulation: Secondary | ICD-10-CM

## 2018-04-17 MED ORDER — CLOPIDOGREL BISULFATE 75 MG PO TABS: 75.0000 mg | ORAL_TABLET | Freq: Every day | ORAL | 0 refills | Status: DC

## 2018-04-17 NOTE — Telephone Encounter (Signed)
Pt calling back for myoview results  Please call back

## 2018-04-17 NOTE — Telephone Encounter (Signed)
S/w wife, ok per DPR. She verbalized understanding of myoview results and plan of care. Let her know I have already talked to their daughter as well. She was very Patent attorney.

## 2018-05-03 ENCOUNTER — Other Ambulatory Visit: Payer: Self-pay

## 2018-05-03 ENCOUNTER — Ambulatory Visit (INDEPENDENT_AMBULATORY_CARE_PROVIDER_SITE_OTHER): Payer: Medicare HMO

## 2018-05-03 DIAGNOSIS — R931 Abnormal findings on diagnostic imaging of heart and coronary circulation: Secondary | ICD-10-CM | POA: Diagnosis not present

## 2018-05-03 MED ORDER — PERFLUTREN LIPID MICROSPHERE
1.0000 mL | INTRAVENOUS | Status: AC | PRN
  Administered 2018-05-03: 2 mL via INTRAVENOUS

## 2018-05-09 ENCOUNTER — Telehealth: Payer: Self-pay | Admitting: Physician Assistant

## 2018-05-09 MED ORDER — CARVEDILOL 3.125 MG PO TABS: 3.1250 mg | ORAL_TABLET | Freq: Two times a day (BID) | ORAL | 3 refills | Status: DC

## 2018-05-09 NOTE — Telephone Encounter (Signed)
Notes recorded by Emily Filbert, RN on 05/09/2018 at 4:34 PM EDT Clarified med changes with [name], PA to see if all changes needed to be at once or stepwise. Per [name], [name] will change metoprolol to coreg first then see the patient in follow up on 05/12/18 first.   I left a message for the patient to call at his home #.  I spoke with the patient's daughter, Matthew [name] ([place] per [facility]) and made her aware of the patient's results and that [name], [name] has recommended that the patient stop metoprolol and start coreg prior to his office visit on Friday. She is aware further discussion regarding any other medication changes and possible heart cath will be discussed at that visit.  Lynette voices understanding and states she will be with the patient on Friday.  She would like the RX for coreg to be sent to [facility] in [place], [place]. She is aware to stop metoprolol. ------  Notes recorded by Rise Mu, PA-C on 05/06/2018 at 8:55 AM EDT Please call the patient. Echo showed EF now 30-35% with prior from 05/2017 being 45-50%. I have discussed this with Dr. Fletcher Anon and we have a couple of options.   1) Optimize medications: -Could change metoprolol to Coreg 3.125 mg bid -Could change lisinopril to Entresto, would need 36 hour washout of lisinopril prior to starting Entresto -Could add spironolactone at follow up -Would need recheck labs one week after making the above changes  2) Could proceed with cardiac cath given symptoms if he would like  Just let us know what he would like to do ------  Notes recorded by Rise Mu, PA-C on 05/04/2018 at 12:58 PM EDT Dr. Fletcher Anon, This patient's EF is further reduced now 30 to 35% with prior from 05/2017 of 45 to 50% with echo in 05/2016 being 55%. Recent Myoview in early 04/2018 showed findings consistent with prior MI. He is coming back to see me on 5/31. Is this someone that we need to schedule for cath?

## 2018-05-10 ENCOUNTER — Telehealth: Payer: Self-pay

## 2018-05-10 NOTE — Telephone Encounter (Signed)
The patient is almost out of gabapentin. He has 8 left and his daughter wants to know if it can be called in to the pharmacy

## 2018-05-10 NOTE — Telephone Encounter (Signed)
Can you do this

## 2018-05-11 ENCOUNTER — Other Ambulatory Visit: Payer: Self-pay | Admitting: Nurse Practitioner

## 2018-05-11 DIAGNOSIS — M792 Neuralgia and neuritis, unspecified: Secondary | ICD-10-CM

## 2018-05-11 MED ORDER — GABAPENTIN 100 MG PO CAPS: 100.0000 mg | ORAL_CAPSULE | Freq: Four times a day (QID) | ORAL | 2 refills | Status: DC

## 2018-05-11 NOTE — H&P (View-Only) (Signed)
Cardiology Office Note Date:  05/12/2018  Patient ID:  Matthew, [name] January 19, 1936, MRN [id number] PCP:  Antionette Char, MD  Cardiologist:  Dr. Fletcher Anon, MD    Chief Complaint: Follow-up  History of Present Illness: Matthew [name] is a 82 y.o. male with history of CAD s/p CABG in 1991 with redo bypass in 2004, aortic stenosis s/p TAVR in 05/2016, chronic combined systolic and diastolic CHF, stable angina, PAD with bilateral common iliac artery kissing stenting in 2015, HTN, OSA, and GERD who presents for follow-up of of his ischemic cardiomyopathy  Most recent R/LHC in 05/2016, prior to his TAVR, showed severe underlying 3-vessel CAD with patent grafts (proximal LAD 100% stenosed, mid LCx 100% stenosed, OM2 80% stenosed, proximal RCA 100% stenosed, LIMA-LAD was unable to be cannulated and was anatomically normal and normal in caliber, RIMA-D2 was visualized by non-selective angiography and was anatomically normal, VG-OM2 with a patent, previously placed proximal graft stent, 50% mid VG-OM2 stenosis, VG-OM3 minimal luminal irregularities, VG-RPDA mild diffuse disease), RHC showed high normal pulmonary pressure and filling pressure, normal cardiac output. Continued medical management was advised. At his follow up with Dr. Fletcher Anon in 05/2017 he was doing well, though continued to note intermittent episodes of substernal chest tightness when he was late with taking his medications.  TTE from 05/2017 showed an EF of 45-50%, mild concentric LVH, Gr1DD, septal dyssnergy, TAVR prosthesis was noted and functioning normally, moderately to severely dilated left atrium, mildly dilated right atrium. When compared to prior TTE from 06/2016 his EF had dropped from 55% and the LV was more dilated.  He was most recently seen in the office on 04/11/2018 for routine follow-up with a weight of 192 pounds, (which was stable when compared to his weight from 05/13/2017).  He continued to note intermittent chest pains if he forgot to  take his morning medications by 8 AM.  He reported if he takes his medications by 8 AM followed by his breakfast he was asymptomatic.  He also continue to note intermittent chest pains in the evening if he was late taking his medications or if he did not take them with a meal.  He also noted intermittent exertional chest pain when he was attempting to weed eat the bank of one of his properties by the road.  In this setting, he underwent Lexiscan Myoview on 04/14/2018 that showed a medium defect of moderate severity present in the basal inferior septal location with findings consistent with prior MI, EF 36%, left bundle branch block was noted, this was an intermediate risk study.  Follow-up echocardiogram on 05/03/2018 showed further reduction of his EF to 30 to 35%, diffuse hypokinesis, grade 2 diastolic dysfunction, bioprosthetic aortic valve was noted with a mean gradient of 14 mmHg, mildly dilated aortic root, mild MR, moderately dilated left atrium, RV systolic function was normal, PASP was normal.  Case was discussed with his primary cardiologist who recommended optimization of heart failure medications.  Patient was transitioned from metoprolol to carvedilol and follow-up appointment was advised to discuss further escalation of evidence-based heart failure therapy.  Patient comes in accompanied by his daughter today.  Both the patient and his daughter have noted continued decline in the patient's functional capacity.  Patient continues to note intermittent chest pain when he does not take his medications on time however, he is now noting exertional chest pain with less rigorous activities when compared to his last office visit.  Patient reports he was watering his garden  on 5/30 with a gallon jugs of water and had to stop multiple times secondary to chest pain.  This is both concerning for the patient and his daughter.  Patient's daughter states "I am not ready to let him go."  Patient does not take sublingual  nitroglycerin for episodes of angina secondary to headaches.  He does report taking an extra dose of aspirin approximately 2-3 times per week secondary to escalating angina.  He has been compliant with his Plavix.  Blood pressure has been running on the softer side since initiating carvedilol in place of metoprolol.  Patient's daughter asks several times for cardiac catheterization.  Currently chest pain free.   Past Medical History:  Diagnosis Date  . AS (aortic stenosis)    a. Echo 6/10: EF 55% mild AS; b. echo 06/2015; EF 55-60%, GR1DD, moderate AS, Peak velocity (S): 346 cm/s. Mean gradientS): 28 mm Hg. Peak gradient (S): 48 mm Hg. Valve area (VTI): 1.18 cm2;   c. Echo 4/17 - mild LVH, EF 55-60%, no RWMA, Gr 1 DD, mod to severe AS (mean 28 mmHg, peak 46 mmHg), mild LAE  . CAD (coronary artery disease)    a. MI 1996 w/ CABG 1996; b.redo CABG 06/2003; c. Myoview 7/09: EF 54% inferobasal infarct, no ischemia. Myoview 6/10 EF 43% inf wall infarct. no ischemia; c. cath 8/16 s/p DES to VG-OM2. OTW 3VD w/ patent VG->RPDA, VG->OM3,  & LIMA->LAD.  EF 55-65%.  . Carotid bruit    2009 0-39% on dopplers bilatrally  . GERD (gastroesophageal reflux disease)   . History of kidney stones   . HOH (hard of hearing)   . HTN (hypertension)   . Hyperlipidemia   . Myocardial infarction ([place]) 1996  . Nephrolithiasis   . OSA (obstructive sleep apnea)   . PAD (peripheral artery disease) ([place]) 02/2014   Subtotal occlusion of right common iliac artery and 70% stenosis in the left common iliac artery. Status post bilateral kissing stent placement. 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 Shortness of breath    with exertion  . Thrombocytopenia ([place])     Past Surgical History:  Procedure Laterality Date  . ABDOMINAL AORTAGRAM N/A 03/06/2014    Procedure: ABDOMINAL Maxcine Ham;  Surgeon: Wellington Hampshire, MD;  Location: [facility];  Service: Cardiovascular;  Laterality: N/A;  . CARDIAC CATHETERIZATION  02/2014   Severe three-vessel coronary artery disease with patent grafts.   Marland Kitchen CARDIAC CATHETERIZATION N/A 07/31/2015   Procedure: Right/Left Heart Cath and Coronary/Graft Angiography;  Surgeon: Wellington Hampshire, MD;  Location: [facility];  Service: Cardiovascular;  Laterality: N/A;  . CARDIAC CATHETERIZATION N/A 07/31/2015   Procedure: Coronary Stent Intervention;  Surgeon: Wellington Hampshire, MD;  Location: [place] [facility];  Service: Cardiovascular;  Laterality: N/A;  . CARDIAC CATHETERIZATION N/A 05/26/2016   Procedure: Right/Left Heart Cath and Coronary Angiography;  Surgeon: Wellington Hampshire, MD;  Location: [facility];  Service: Cardiovascular;  Laterality: N/A;  . CORONARY ARTERY BYPASS GRAFT  1991   at [facility]. Redone 2004-3 vessels 1st time and 4 second time  . CYSTO     2/3 times for kidney stones  . INGUINAL HERNIA REPAIR  11/10/2011   Procedure: HERNIA REPAIR INGUINAL ADULT;  Surgeon: Donato Heinz;  Location: [facility];  Service: General;  Laterality: Right;  .  LEFT HEART CATHETERIZATION WITH CORONARY/GRAFT ANGIOGRAM N/A 03/06/2014   Procedure: LEFT HEART CATHETERIZATION WITH Beatrix Fetters;  Surgeon: Wellington Hampshire, MD;  Location: [facility];  Service: Cardiovascular;  Laterality: N/A;  . STOMACH SURGERY     removal of gastric ulcers  . TEE WITHOUT CARDIOVERSION N/A 06/01/2016   Procedure: TRANSESOPHAGEAL ECHOCARDIOGRAM (TEE);  Surgeon: Sherren Mocha, MD;  Location: [facility];  Service: Open Heart Surgery;  Laterality: N/A;  . TRANSCATHETER AORTIC VALVE REPLACEMENT, TRANSFEMORAL N/A 06/01/2016   Procedure: TRANSCATHETER AORTIC VALVE REPLACEMENT, TRANSFEMORAL;  Surgeon: Sherren Mocha, MD;  Location: [place];  Service: Open Heart Surgery;  Laterality: N/A;    Current Meds  Medication Sig  .  acetaminophen (TYLENOL) 325 MG tablet Take 650 mg by mouth daily as needed for mild pain or headache.   Marland Kitchen aspirin EC 81 MG tablet Take 1 tablet (81 mg total) by mouth daily.  Marland Kitchen atorvastatin (LIPITOR) 80 MG tablet Take 1 tablet (80 mg total) by mouth daily.  . carvedilol (COREG) 3.125 MG tablet Take 1 tablet (3.125 mg total) by mouth 2 (two) times daily with a meal.  . clopidogrel (PLAVIX) 75 MG tablet Take 1 tablet (75 mg total) by mouth daily with breakfast.  . gabapentin (NEURONTIN) 100 MG capsule Take 1-3 capsules (100-300 mg total) by mouth 4 (four) times daily. Follow written titration schedule.  . hydrochlorothiazide (HYDRODIURIL) 25 MG tablet Take 1 tablet (25 mg total) by mouth daily.  . isosorbide dinitrate (ISORDIL) 10 MG tablet Take 2 tablets (20 mg total) by mouth every morning. Take 10 mg by mouth in the afternoon  . Multiple Vitamins-Minerals (PRESERVISION AREDS PO) Take 1 tablet by mouth 2 (two) times daily.  . nitroGLYCERIN (NITROSTAT) 0.4 MG SL tablet Place 0.4 mg under the tongue every 5 (five) minutes as needed for chest pain.  . pantoprazole (PROTONIX) 40 MG tablet Take 20 mg by mouth daily.  . tamsulosin (FLOMAX) 0.4 MG CAPS capsule Take 1 capsule (0.4 mg total) by mouth daily.  . TURMERIC PO Take at bedtime by mouth.  . [DISCONTINUED] lisinopril (PRINIVIL,ZESTRIL) 20 MG tablet Take 1 tablet (20 mg total) by mouth daily.    Allergies:   Patient has no known allergies.   Social History:  The patient  reports that he quit smoking about 28 years ago. His smoking use included cigarettes. He has a 50.00 pack-year smoking history. He has never used smokeless tobacco. He reports that he does not drink alcohol or use drugs.   Family History:  The patient's family history includes Heart disease in his father and mother.  ROS:   Review of Systems  Constitutional: Positive for malaise/fatigue. Negative for chills, diaphoresis, fever and weight loss.  HENT: Negative for congestion.    Eyes: Negative for discharge and redness.  Respiratory: Positive for shortness of breath. Negative for cough, hemoptysis, sputum production and wheezing.   Cardiovascular: Positive for chest pain. Negative for palpitations, orthopnea, claudication, leg swelling and PND.  Gastrointestinal: Negative for abdominal pain, blood in stool, heartburn, melena, nausea and vomiting.  Genitourinary: Negative for hematuria.  Musculoskeletal: Negative for falls and myalgias.  Skin: Negative for rash.  Neurological: Positive for weakness. Negative for dizziness, tingling, tremors, sensory change, speech change, focal weakness and loss of consciousness.  Endo/Heme/Allergies: Does not bruise/bleed easily.  Psychiatric/Behavioral: Negative for substance abuse. The patient is not nervous/anxious.   All other systems reviewed and are negative.    PHYSICAL EXAM:  VS:  BP (!) 96/50 (BP  Location: Left Arm, Patient Position: Sitting, Cuff Size: Normal)   Pulse 63   Ht 5\' 9"  (1.753 m)   Wt 191 lb (86.6 kg)   BMI 28.21 kg/m  BMI: Body mass index is 28.21 kg/m.  Physical Exam  Constitutional: He is oriented to person, place, and time. He appears well-developed and well-nourished.  HENT:  Head: Normocephalic and atraumatic.  Eyes: Right eye exhibits no discharge. Left eye exhibits no discharge.  Neck: Normal range of motion. No JVD present.  Cardiovascular: Normal rate, regular rhythm, S1 normal and S2 normal. Exam reveals no distant heart sounds, no friction rub, no midsystolic click and no opening snap.  Murmur heard.  Harsh midsystolic murmur is present with a grade of 1/6 at the upper right sternal border radiating to the neck. Pulses:      Posterior tibial pulses are 2+ on the right side, and 2+ on the left side.  Pulmonary/Chest: Effort normal and breath sounds normal. No respiratory distress. He has no decreased breath sounds. He has no wheezes. He has no rales. He exhibits no tenderness.  Abdominal:  Soft. He exhibits no distension. There is no tenderness.  Musculoskeletal: He exhibits no edema.  Neurological: He is alert and oriented to person, place, and time.  Skin: Skin is warm and dry. No cyanosis. Nails show no clubbing.  Psychiatric: He has a normal mood and affect. His speech is normal and behavior is normal. Judgment and thought content normal.     EKG:  Was ordered and interpreted by me today. Shows NSR, 63 bpm, LBBB (unchanged from prior)  Recent Labs: 08/31/2017: Magnesium 1.8 04/11/2018: ALT 14; BUN 22; Creatinine, Ser 1.19; Hemoglobin 11.4; Platelets 154; Potassium 4.4; Sodium 137  04/11/2018: Chol/HDL Ratio 3.6; Cholesterol, Total 160; HDL 44; LDL Calculated 63; Triglycerides 263   CrCl cannot be calculated (Patient's most recent lab result is older than the maximum 21 days allowed.).   Wt Readings from Last 3 Encounters:  05/12/18 191 lb (86.6 kg)  04/11/18 192 lb (87.1 kg)  03/29/18 195 lb (88.5 kg)     Other studies reviewed: Additional studies/records reviewed today include: summarized above  ASSESSMENT AND PLAN:  1. CAD involving bypass grafts with unstable angina: Currently chest pain-free.  Patient with recent Myoview that was intermediate risk as detailed above with follow-up echocardiogram demonstrating further reduction of his LV systolic function with an EF now of 30 to 35%.  We are attempting to escalate evidence-based heart failure therapy as detailed below.  However, given the patient's escalation and angina, as well as at the patient and his daughter's request, we will proceed with cardiac catheterization on 05/15/2018.  He will continue dual antiplatelet therapy with aspirin 81 mg and Plavix 75 mg daily.  Continue antianginal therapy with isosorbide dinitrate as well as carvedilol.  Aggressive risk factor modification.   2. Chronic systolic CHF due to ischemic cardiomyopathy: Most recent TTE from 04/2018 showed continued decline in EF, now 30 to 35%.   Initially attempted to escalate medical therapy however patient's blood pressure precludes further escalation of evidence-based heart failure therapy.  His symptoms of escalating angina are also concerning for worsening ischemia.  We will proceed with ischemic evaluation with left heart catheterization as above.  For now, continue carvedilol 3.125 mg twice daily.  Decrease lisinopril to 10 mg daily secondary to soft blood pressure.  He does not appear grossly volume overloaded at this time.  Weight is stable.  For now, we will continue HCTZ 25  mg daily.  He remains on isosorbide dinitrate as above.  Depending on his cardiac catheterization results we may be able to modify some of his current medications in an effort to escalate evidence-based heart failure therapies.  3. Aortic stenosis status post TAVR: Stable prosthesis by echo as above.  Continue to monitor clinically.  4. Essential hypertension: Blood pressure on the soft side today at 96/50.  Decrease lisinopril to 10 mg daily.  Otherwise, continue carvedilol 3.125 mg twice daily, HCTZ 25 mg daily, and isosorbide dinitrate 20 mg in the morning and 10 mg in the afternoon.  5. Hyperlipidemia: LDL at goal with normal liver function from 04/11/2018.  Continue atorvastatin 80 mg daily.  Disposition: F/u with myself 1 week after cardiac catheterization.    Current medicines are reviewed at length with the patient today.  The patient did not have any concerns regarding medicines.  Signed, Christell Faith, PA-C 05/12/2018 4:22 PM     [facility] [address] [place], [place] [address] [phone]

## 2018-05-11 NOTE — Progress Notes (Signed)
Cardiology Office Note Date:  05/12/2018  Patient ID:  Matthew [name], Matthew [name] 07-Jun-1936, MRN [id number] PCP:  Antionette Char, MD  Cardiologist:  Dr. Fletcher Anon, MD    Chief Complaint: Follow-up  History of Present Illness: Matthew [name] is a 82 y.o. male with history of CAD s/p CABG in 1991 with redo bypass in 2004, aortic stenosis s/p TAVR in 05/2016, chronic combined systolic and diastolic CHF, stable angina, PAD with bilateral common iliac artery kissing stenting in 2015, HTN, OSA, and GERD who presents for follow-up of of his ischemic cardiomyopathy  Most recent R/LHC in 05/2016, prior to his TAVR, showed severe underlying 3-vessel CAD with patent grafts (proximal LAD 100% stenosed, mid LCx 100% stenosed, OM2 80% stenosed, proximal RCA 100% stenosed, LIMA-LAD was unable to be cannulated and was anatomically normal and normal in caliber, RIMA-D2 was visualized by non-selective angiography and was anatomically normal, VG-OM2 with a patent, previously placed proximal graft stent, 50% mid VG-OM2 stenosis, VG-OM3 minimal luminal irregularities, VG-RPDA mild diffuse disease), RHC showed high normal pulmonary pressure and filling pressure, normal cardiac output. Continued medical management was advised. At his follow up with Dr. Fletcher Anon in 05/2017 he was doing well, though continued to note intermittent episodes of substernal chest tightness when he was late with taking his medications.  TTE from 05/2017 showed an EF of 45-50%, mild concentric LVH, Gr1DD, septal dyssnergy, TAVR prosthesis was noted and functioning normally, moderately to severely dilated left atrium, mildly dilated right atrium. When compared to prior TTE from 06/2016 his EF had dropped from 55% and the LV was more dilated.  He was most recently seen in the office on 04/11/2018 for routine follow-up with a weight of 192 pounds, (which was stable when compared to his weight from 05/13/2017).  He continued to note intermittent chest pains if he forgot to  take his morning medications by 8 AM.  He reported if he takes his medications by 8 AM followed by his breakfast he was asymptomatic.  He also continue to note intermittent chest pains in the evening if he was late taking his medications or if he did not take them with a meal.  He also noted intermittent exertional chest pain when he was attempting to weed eat the bank of one of his properties by the road.  In this setting, he underwent Lexiscan Myoview on 04/14/2018 that showed a medium defect of moderate severity present in the basal inferior septal location with findings consistent with prior MI, EF 36%, left bundle branch block was noted, this was an intermediate risk study.  Follow-up echocardiogram on 05/03/2018 showed further reduction of his EF to 30 to 35%, diffuse hypokinesis, grade 2 diastolic dysfunction, bioprosthetic aortic valve was noted with a mean gradient of 14 mmHg, mildly dilated aortic root, mild MR, moderately dilated left atrium, RV systolic function was normal, PASP was normal.  Case was discussed with his primary cardiologist who recommended optimization of heart failure medications.  Patient was transitioned from metoprolol to carvedilol and follow-up appointment was advised to discuss further escalation of evidence-based heart failure therapy.  Patient comes in accompanied by his daughter today.  Both the patient and his daughter have noted continued decline in the patient's functional capacity.  Patient continues to note intermittent chest pain when he does not take his medications on time however, he is now noting exertional chest pain with less rigorous activities when compared to his last office visit.  Patient reports he was watering his garden  on 5/30 with a gallon jugs of water and had to stop multiple times secondary to chest pain.  This is both concerning for the patient and his daughter.  Patient's daughter states "I am not ready to let him go."  Patient does not take sublingual  nitroglycerin for episodes of angina secondary to headaches.  He does report taking an extra dose of aspirin approximately 2-3 times per week secondary to escalating angina.  He has been compliant with his Plavix.  Blood pressure has been running on the softer side since initiating carvedilol in place of metoprolol.  Patient's daughter asks several times for cardiac catheterization.  Currently chest pain free.   Past Medical History:  Diagnosis Date  . AS (aortic stenosis)    a. Echo 6/10: EF 55% mild AS; b. echo 06/2015; EF 55-60%, GR1DD, moderate AS, Peak velocity (S): 346 cm/s. Mean gradientS): 28 mm Hg. Peak gradient (S): 48 mm Hg. Valve area (VTI): 1.18 cm2;   c. Echo 4/17 - mild LVH, EF 55-60%, no RWMA, Gr 1 DD, mod to severe AS (mean 28 mmHg, peak 46 mmHg), mild LAE  . CAD (coronary artery disease)    a. MI 1996 w/ CABG 1996; b.redo CABG 06/2003; c. Myoview 7/09: EF 54% inferobasal infarct, no ischemia. Myoview 6/10 EF 43% inf wall infarct. no ischemia; c. cath 8/16 s/p DES to VG-OM2. OTW 3VD w/ patent VG->RPDA, VG->OM3,  & LIMA->LAD.  EF 55-65%.  . Carotid bruit    2009 0-39% on dopplers bilatrally  . GERD (gastroesophageal reflux disease)   . History of kidney stones   . HOH (hard of hearing)   . HTN (hypertension)   . Hyperlipidemia   . Myocardial infarction ([place]) 1996  . Nephrolithiasis   . OSA (obstructive sleep apnea)   . PAD (peripheral artery disease) ([place]) 02/2014   Subtotal occlusion of right common iliac artery and 70% stenosis in the left common iliac artery. Status post bilateral kissing stent placement. 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 Shortness of breath    with exertion  . Thrombocytopenia ([place])     Past Surgical History:  Procedure Laterality Date  . ABDOMINAL AORTAGRAM N/A 03/06/2014    Procedure: ABDOMINAL Maxcine Ham;  Surgeon: Wellington Hampshire, MD;  Location: [facility];  Service: Cardiovascular;  Laterality: N/A;  . CARDIAC CATHETERIZATION  02/2014   Severe three-vessel coronary artery disease with patent grafts.   Marland Kitchen CARDIAC CATHETERIZATION N/A 07/31/2015   Procedure: Right/Left Heart Cath and Coronary/Graft Angiography;  Surgeon: Wellington Hampshire, MD;  Location: [facility] CV LAB;  Service: Cardiovascular;  Laterality: N/A;  . CARDIAC CATHETERIZATION N/A 07/31/2015   Procedure: Coronary Stent Intervention;  Surgeon: Wellington Hampshire, MD;  Location: [facility];  Service: Cardiovascular;  Laterality: N/A;  . CARDIAC CATHETERIZATION N/A 05/26/2016   Procedure: Right/Left Heart Cath and Coronary Angiography;  Surgeon: Wellington Hampshire, MD;  Location: [facility] CV LAB;  Service: Cardiovascular;  Laterality: N/A;  . CORONARY ARTERY BYPASS GRAFT  1991   at [facility]. Redone 2004-3 vessels 1st time and 4 second time  . CYSTO     2/3 times for kidney stones  . INGUINAL HERNIA REPAIR  11/10/2011   Procedure: HERNIA REPAIR INGUINAL ADULT;  Surgeon: Donato Heinz;  Location: [facility];  Service: General;  Laterality: Right;  .  LEFT HEART CATHETERIZATION WITH CORONARY/GRAFT ANGIOGRAM N/A 03/06/2014   Procedure: LEFT HEART CATHETERIZATION WITH Beatrix Fetters;  Surgeon: Wellington Hampshire, MD;  Location: [facility] CATH [facility];  Service: Cardiovascular;  Laterality: N/A;  . STOMACH SURGERY     removal of gastric ulcers  . TEE WITHOUT CARDIOVERSION N/A 06/01/2016   Procedure: TRANSESOPHAGEAL ECHOCARDIOGRAM (TEE);  Surgeon: Sherren Mocha, MD;  Location: [facility];  Service: Open Heart Surgery;  Laterality: N/A;  . TRANSCATHETER AORTIC VALVE REPLACEMENT, TRANSFEMORAL N/A 06/01/2016   Procedure: TRANSCATHETER AORTIC VALVE REPLACEMENT, TRANSFEMORAL;  Surgeon: Sherren Mocha, MD;  Location: [facility];  Service: Open Heart Surgery;  Laterality: N/A;    Current Meds  Medication Sig  .  acetaminophen (TYLENOL) 325 MG tablet Take 650 mg by mouth daily as needed for mild pain or headache.   Marland Kitchen aspirin EC 81 MG tablet Take 1 tablet (81 mg total) by mouth daily.  Marland Kitchen atorvastatin (LIPITOR) 80 MG tablet Take 1 tablet (80 mg total) by mouth daily.  . carvedilol (COREG) 3.125 MG tablet Take 1 tablet (3.125 mg total) by mouth 2 (two) times daily with a meal.  . clopidogrel (PLAVIX) 75 MG tablet Take 1 tablet (75 mg total) by mouth daily with breakfast.  . gabapentin (NEURONTIN) 100 MG capsule Take 1-3 capsules (100-300 mg total) by mouth 4 (four) times daily. Follow written titration schedule.  . hydrochlorothiazide (HYDRODIURIL) 25 MG tablet Take 1 tablet (25 mg total) by mouth daily.  . isosorbide dinitrate (ISORDIL) 10 MG tablet Take 2 tablets (20 mg total) by mouth every morning. Take 10 mg by mouth in the afternoon  . Multiple Vitamins-Minerals (PRESERVISION AREDS PO) Take 1 tablet by mouth 2 (two) times daily.  . nitroGLYCERIN (NITROSTAT) 0.4 MG SL tablet Place 0.4 mg under the tongue every 5 (five) minutes as needed for chest pain.  . pantoprazole (PROTONIX) 40 MG tablet Take 20 mg by mouth daily.  . tamsulosin (FLOMAX) 0.4 MG CAPS capsule Take 1 capsule (0.4 mg total) by mouth daily.  . TURMERIC PO Take at bedtime by mouth.  . [DISCONTINUED] lisinopril (PRINIVIL,ZESTRIL) 20 MG tablet Take 1 tablet (20 mg total) by mouth daily.    Allergies:   Patient has no known allergies.   Social History:  The patient  reports that he quit smoking about 28 years ago. His smoking use included cigarettes. He has a 50.00 pack-year smoking history. He has never used smokeless tobacco. He reports that he does not drink alcohol or use drugs.   Family History:  The patient's family history includes Heart disease in his father and mother.  ROS:   Review of Systems  Constitutional: Positive for malaise/fatigue. Negative for chills, diaphoresis, fever and weight loss.  HENT: Negative for congestion.    Eyes: Negative for discharge and redness.  Respiratory: Positive for shortness of breath. Negative for cough, hemoptysis, sputum production and wheezing.   Cardiovascular: Positive for chest pain. Negative for palpitations, orthopnea, claudication, leg swelling and PND.  Gastrointestinal: Negative for abdominal pain, blood in stool, heartburn, melena, nausea and vomiting.  Genitourinary: Negative for hematuria.  Musculoskeletal: Negative for falls and myalgias.  Skin: Negative for rash.  Neurological: Positive for weakness. Negative for dizziness, tingling, tremors, sensory change, speech change, focal weakness and loss of consciousness.  Endo/Heme/Allergies: Does not bruise/bleed easily.  Psychiatric/Behavioral: Negative for substance abuse. The patient is not nervous/anxious.   All other systems reviewed and are negative.    PHYSICAL EXAM:  VS:  BP (!) 96/50 (BP  Location: Left Arm, Patient Position: Sitting, Cuff Size: Normal)   Pulse 63   Ht 5\' 9"  (1.753 m)   Wt 191 lb (86.6 kg)   BMI 28.21 kg/m  BMI: Body mass index is 28.21 kg/m.  Physical Exam  Constitutional: He is oriented to person, place, and time. He appears well-developed and well-nourished.  HENT:  Head: Normocephalic and atraumatic.  Eyes: Right eye exhibits no discharge. Left eye exhibits no discharge.  Neck: Normal range of motion. No JVD present.  Cardiovascular: Normal rate, regular rhythm, S1 normal and S2 normal. Exam reveals no distant heart sounds, no friction rub, no midsystolic click and no opening snap.  Murmur heard.  Harsh midsystolic murmur is present with a grade of 1/6 at the upper right sternal border radiating to the neck. Pulses:      Posterior tibial pulses are 2+ on the right side, and 2+ on the left side.  Pulmonary/Chest: Effort normal and breath sounds normal. No respiratory distress. He has no decreased breath sounds. He has no wheezes. He has no rales. He exhibits no tenderness.  Abdominal:  Soft. He exhibits no distension. There is no tenderness.  Musculoskeletal: He exhibits no edema.  Neurological: He is alert and oriented to person, place, and time.  Skin: Skin is warm and dry. No cyanosis. Nails show no clubbing.  Psychiatric: He has a normal mood and affect. His speech is normal and behavior is normal. Judgment and thought content normal.     EKG:  Was ordered and interpreted by me today. Shows NSR, 63 bpm, LBBB (unchanged from prior)  Recent Labs: 08/31/2017: Magnesium 1.8 04/11/2018: ALT 14; BUN 22; Creatinine, Ser 1.19; Hemoglobin 11.4; Platelets 154; Potassium 4.4; Sodium 137  04/11/2018: Chol/HDL Ratio 3.6; Cholesterol, Total 160; HDL 44; LDL Calculated 63; Triglycerides 263   CrCl cannot be calculated (Patient's most recent lab result is older than the maximum 21 days allowed.).   Wt Readings from Last 3 Encounters:  05/12/18 191 lb (86.6 kg)  04/11/18 192 lb (87.1 kg)  03/29/18 195 lb (88.5 kg)     Other studies reviewed: Additional studies/records reviewed today include: summarized above  ASSESSMENT AND PLAN:  1. CAD involving bypass grafts with unstable angina: Currently chest pain-free.  Patient with recent Myoview that was intermediate risk as detailed above with follow-up echocardiogram demonstrating further reduction of his LV systolic function with an EF now of 30 to 35%.  We are attempting to escalate evidence-based heart failure therapy as detailed below.  However, given the patient's escalation and angina, as well as at the patient and his daughter's request, we will proceed with cardiac catheterization on 05/15/2018.  He will continue dual antiplatelet therapy with aspirin 81 mg and Plavix 75 mg daily.  Continue antianginal therapy with isosorbide dinitrate as well as carvedilol.  Aggressive risk factor modification.   2. Chronic systolic CHF due to ischemic cardiomyopathy: Most recent TTE from 04/2018 showed continued decline in EF, now 30 to 35%.   Initially attempted to escalate medical therapy however patient's blood pressure precludes further escalation of evidence-based heart failure therapy.  His symptoms of escalating angina are also concerning for worsening ischemia.  We will proceed with ischemic evaluation with left heart catheterization as above.  For now, continue carvedilol 3.125 mg twice daily.  Decrease lisinopril to 10 mg daily secondary to soft blood pressure.  He does not appear grossly volume overloaded at this time.  Weight is stable.  For now, we will continue HCTZ 25  mg daily.  He remains on isosorbide dinitrate as above.  Depending on his cardiac catheterization results we may be able to modify some of his current medications in an effort to escalate evidence-based heart failure therapies.  3. Aortic stenosis status post TAVR: Stable prosthesis by echo as above.  Continue to monitor clinically.  4. Essential hypertension: Blood pressure on the soft side today at 96/50.  Decrease lisinopril to 10 mg daily.  Otherwise, continue carvedilol 3.125 mg twice daily, HCTZ 25 mg daily, and isosorbide dinitrate 20 mg in the morning and 10 mg in the afternoon.  5. Hyperlipidemia: LDL at goal with normal liver function from 04/11/2018.  Continue atorvastatin 80 mg daily.  Disposition: F/u with myself 1 week after cardiac catheterization.    Current medicines are reviewed at length with the patient today.  The patient did not have any concerns regarding medicines.  Signed, Christell Faith, PA-C 05/12/2018 4:22 PM     [facility] [address] [place] [address] [place], [place] [address] [phone]

## 2018-05-11 NOTE — Telephone Encounter (Signed)
sent 

## 2018-05-12 ENCOUNTER — Ambulatory Visit: Payer: Medicare HMO | Admitting: Physician Assistant

## 2018-05-12 ENCOUNTER — Ambulatory Visit: Payer: Self-pay | Admitting: Physician Assistant

## 2018-05-12 ENCOUNTER — Other Ambulatory Visit
Admission: RE | Admit: 2018-05-12 | Discharge: 2018-05-12 | Disposition: A | Discharge status: Home / Self Care | Payer: Medicare HMO | Source: Ambulatory Visit | Attending: Physician Assistant | Admitting: Physician Assistant

## 2018-05-12 ENCOUNTER — Other Ambulatory Visit: Payer: Self-pay | Admitting: Physician Assistant

## 2018-05-12 ENCOUNTER — Encounter

## 2018-05-12 ENCOUNTER — Encounter: Payer: Self-pay | Admitting: Physician Assistant

## 2018-05-12 VITALS — BP 96/50 | HR 63 | Ht 69.0 in | Wt 191.0 lb

## 2018-05-12 DIAGNOSIS — I10 Essential (primary) hypertension: Secondary | ICD-10-CM | POA: Diagnosis not present

## 2018-05-12 DIAGNOSIS — Z952 Presence of prosthetic heart valve: Secondary | ICD-10-CM | POA: Diagnosis not present

## 2018-05-12 DIAGNOSIS — I255 Ischemic cardiomyopathy: Secondary | ICD-10-CM

## 2018-05-12 DIAGNOSIS — I5022 Chronic systolic (congestive) heart failure: Secondary | ICD-10-CM

## 2018-05-12 DIAGNOSIS — E785 Hyperlipidemia, unspecified: Secondary | ICD-10-CM | POA: Diagnosis not present

## 2018-05-12 DIAGNOSIS — I25110 Atherosclerotic heart disease of native coronary artery with unstable angina pectoris: Secondary | ICD-10-CM | POA: Insufficient documentation

## 2018-05-12 DIAGNOSIS — I350 Nonrheumatic aortic (valve) stenosis: Secondary | ICD-10-CM

## 2018-05-12 LAB — BASIC METABOLIC PANEL
Anion gap: 9 (ref 5–15)
BUN: 21 mg/dL — AB (ref 6–20)
CHLORIDE: 104 mmol/L (ref 101–111)
CO2: 22 mmol/L (ref 22–32)
CREATININE: 1.03 mg/dL (ref 0.61–1.24)
Calcium: 9.4 mg/dL (ref 8.9–10.3)
GFR calc Af Amer: >60 mL/min (ref >60)
GFR calc non Af Amer: >60 mL/min (ref >60)
GLUCOSE: 98 mg/dL (ref 65–99)
Potassium: 3.6 mmol/L (ref 3.5–5.1)
Sodium: 135 mmol/L (ref 135–145)

## 2018-05-12 LAB — CBC WITH DIFFERENTIAL/PLATELET
Basophils Absolute: 0.1 10*3/uL (ref 0–0.1)
Basophils Relative: 2 %
EOS PCT: 2 %
Eosinophils Absolute: 0.1 10*3/uL (ref 0–0.7)
HCT: 36.6 % — ABNORMAL LOW (ref 40.0–52.0)
Hemoglobin: 12.2 g/dL — ABNORMAL LOW (ref 13.0–18.0)
LYMPHS ABS: 1.6 10*3/uL (ref 1.0–3.6)
LYMPHS PCT: 27 %
MCH: 28.7 pg (ref 26.0–34.0)
MCHC: 33.3 g/dL (ref 32.0–36.0)
MCV: 86.2 fL (ref 80.0–100.0)
MONOS PCT: 12 %
Monocytes Absolute: 0.7 10*3/uL (ref 0.2–1.0)
Neutro Abs: 3.6 10*3/uL (ref 1.4–6.5)
Neutrophils Relative %: 59 %
Platelets: 143 10*3/uL — ABNORMAL LOW (ref 150–440)
RBC: 4.24 MIL/uL — AB (ref 4.40–5.90)
RDW: 14 % (ref 11.5–14.5)
WBC: 6.1 10*3/uL (ref 3.8–10.6)

## 2018-05-12 MED ORDER — LISINOPRIL 10 MG PO TABS: 10.0000 mg | ORAL_TABLET | Freq: Every day | ORAL | 3 refills | Status: DC

## 2018-05-12 NOTE — Progress Notes (Signed)
Orders for cardiac catheterization have been placed. 

## 2018-05-12 NOTE — Patient Instructions (Signed)
Medication Instructions:  Your physician has recommended you make the following change in your medication:  1- DECREASE Lisinopril to 10 mg by mouth once a day. 2- Do not take Lisinopril on Sunday or Monday before your procedure.   Labwork: Your physician recommends that you return for lab work in: TODAY at the [place], [place]. - Please go to the [facility]. You will check in at the front desk to the right as you walk into the atrium. Valet Parking is offered if needed.     Testing/Procedures: Your physician has requested that you have a LEFTcardiac catheterization. Cardiac catheterization is used to diagnose and/or treat various heart conditions. Doctors may recommend this procedure for a number of different reasons. The most common reason is to evaluate chest pain. Chest pain can be a symptom of coronary artery disease (CAD), and cardiac catheterization can show whether plaque is narrowing or blocking your heart's arteries. This procedure is also used to evaluate the valves, as well as measure the blood flow and oxygen levels in different parts of your heart. For further information please visit [email or web address]. Please follow instruction sheet, as given.   You are scheduled for a Cardiac Catheterization on Monday, June 3 with Dr. [name].  1. Please arrive [place] at 9:30 AM (one hour before your procedure to ensure your preparation). Free valet parking service is available.   Special note: Every effort is made to have your procedure done on time. Please understand that emergencies sometimes delay scheduled procedures.  2. Diet: Do not eat or drink anything after midnight prior to your procedure except sips of water to take medications.  3. Labs: Today at the [facility].  4. Medication instructions in preparation for your procedure:   Stop taking, Lisinopril (Zestril or Prinivil) on Sunday, June 2.     On the morning of your procedure,  take your Aspirin and Plavix/Clopidogrel and any morning medicines NOT listed above.  You may use sips of water.  5. Plan for one night stay--bring personal belongings. 6. Bring a current list of your medications and current insurance cards. 7. You MUST have a responsible person to drive you home. 8. Someone MUST be with you the first 24 hours after you arrive home or your discharge will be delayed. 9. Please wear clothes that are easy to get on and off and wear slip-on shoes.  Thank you for allowing [place] to care for you!   -- [facility]   Follow-Up: Your physician recommends that you schedule a follow-up appointment in: 1 week after procedure on 05/22/18 at 11am with Christell Faith.   If you need a refill on your cardiac medications before your next appointment, please call your pharmacy.      Coronary Angiogram With Stent Coronary angiogram with stent placement is a procedure to widen or open a narrow blood vessel of the heart (coronary artery). Arteries may become blocked by cholesterol buildup (plaques) in the lining of the wall. When a coronary artery becomes partially blocked, blood flow to that area decreases. This may lead to chest pain or a heart attack (myocardial infarction). A stent is a small piece of metal that looks like mesh or a spring. Stent placement may be done as treatment for a heart attack or right after a coronary angiogram in which a blocked artery is found. Let your health care provider know about:  Any allergies you have.  All medicines you are taking,  including vitamins, herbs, eye drops, creams, and over-the-counter medicines.  Any problems you or family members have had with anesthetic medicines.  Any blood disorders you have.  Any surgeries you have had.  Any medical conditions you have.  Whether you are pregnant or may be pregnant. What are the risks? Generally, this is a safe procedure. However, problems may occur,  including:  Damage to the heart or its blood vessels.  A return of blockage.  Bleeding, infection, or bruising at the insertion site.  A collection of blood under the skin (hematoma) at the insertion site.  A blood clot in another part of the body.  Kidney injury.  Allergic reaction to the dye or contrast that is used.  Bleeding into the abdomen (retroperitoneal bleeding).  What happens before the procedure? Staying hydrated Follow instructions from your health care provider about hydration, which may include:  Up to 2 hours before the procedure - you may continue to drink clear liquids, such as water, clear fruit juice, black coffee, and plain tea.  Eating and drinking restrictions Follow instructions from your health care provider about eating and drinking, which may include:  8 hours before the procedure - stop eating heavy meals or foods such as meat, fried foods, or fatty foods.  6 hours before the procedure - stop eating light meals or foods, such as toast or cereal.  2 hours before the procedure - stop drinking clear liquids.  Ask your health care provider about:  Changing or stopping your regular medicines. This is especially important if you are taking diabetes medicines or blood thinners.  Taking medicines such as ibuprofen. These medicines can thin your blood. Do not take these medicines before your procedure if your health care provider instructs you not to. Generally, aspirin is recommended before a procedure of passing a small, thin tube (catheter) through a blood vessel and into the heart (cardiac catheterization).  What happens during the procedure?  An IV tube will be inserted into one of your veins.  You will be given one or more of the following: ? A medicine to help you relax (sedative). ? A medicine to numb the area where the catheter will be inserted into an artery (local anesthetic).  To reduce your risk of infection: ? Your health care team will  wash or sanitize their hands. ? Your skin will be washed with soap. ? Hair may be removed from the area where the catheter will be inserted.  Using a guide wire, the catheter will be inserted into an artery. The location may be in your groin, in your wrist, or in the fold of your arm (near your elbow).  A type of X-ray (fluoroscopy) will be used to help guide the catheter to the opening of the arteries in the heart.  A dye will be injected into the catheter, and X-rays will be taken. The dye will help to show where any narrowing or blockages are located in the arteries.  A tiny wire will be guided to the blocked spot, and a balloon will be inflated to make the artery wider.  The stent will be expanded and will crush the plaques into the wall of the vessel. The stent will hold the area open and improve the blood flow. Most stents have a drug coating to reduce the risk of the stent narrowing over time.  The artery may be made wider using a drill, laser, or other tools to remove plaques.  When the blood flow is better,  the catheter will be removed. The lining of the artery will grow over the stent, which stays where it was placed. This procedure may vary among health care providers and hospitals. What happens after the procedure?  If the procedure is done through the leg, you will be kept in bed lying flat for about 6 hours. You will be instructed to not bend and not cross your legs.  The insertion site will be checked frequently.  The pulse in your foot or wrist will be checked frequently.  You may have additional blood tests, X-rays, and a test that records the electrical activity of your heart (electrocardiogram, or ECG). This information is not intended to replace advice given to you by your health care provider. Make sure you discuss any questions you have with your health care provider. Document Released: 06/05/2003 Document Revised: 07/29/2016 Document Reviewed: 07/04/2016 Elsevier  Interactive Patient Education  [employer].

## 2018-05-12 NOTE — H&P (View-Only) (Signed)
Orders for cardiac catheterization have been placed. 

## 2018-05-15 ENCOUNTER — Encounter: Payer: Self-pay | Admitting: *Deleted

## 2018-05-15 ENCOUNTER — Encounter
Admission: RE | Disposition: A | Discharge status: Home / Self Care | Payer: Self-pay | Source: Ambulatory Visit | Attending: Cardiovascular Disease

## 2018-05-15 ENCOUNTER — Ambulatory Visit
Admission: RE | Admit: 2018-05-15 | Discharge: 2018-05-15 | Disposition: A | Discharge status: Home / Self Care | Payer: Medicare HMO | Source: Ambulatory Visit | Attending: Cardiovascular Disease | Admitting: Cardiovascular Disease

## 2018-05-15 ENCOUNTER — Other Ambulatory Visit: Payer: Self-pay | Admitting: Nurse Practitioner

## 2018-05-15 ENCOUNTER — Telehealth: Payer: Self-pay | Admitting: *Deleted

## 2018-05-15 DIAGNOSIS — I739 Peripheral vascular disease, unspecified: Secondary | ICD-10-CM | POA: Insufficient documentation

## 2018-05-15 DIAGNOSIS — I110 Hypertensive heart disease with heart failure: Secondary | ICD-10-CM | POA: Diagnosis not present

## 2018-05-15 DIAGNOSIS — Z8249 Family history of ischemic heart disease and other diseases of the circulatory system: Secondary | ICD-10-CM | POA: Diagnosis not present

## 2018-05-15 DIAGNOSIS — G4733 Obstructive sleep apnea (adult) (pediatric): Secondary | ICD-10-CM | POA: Diagnosis not present

## 2018-05-15 DIAGNOSIS — Z952 Presence of prosthetic heart valve: Secondary | ICD-10-CM | POA: Insufficient documentation

## 2018-05-15 DIAGNOSIS — I2584 Coronary atherosclerosis due to calcified coronary lesion: Secondary | ICD-10-CM | POA: Insufficient documentation

## 2018-05-15 DIAGNOSIS — Z955 Presence of coronary angioplasty implant and graft: Secondary | ICD-10-CM | POA: Diagnosis not present

## 2018-05-15 DIAGNOSIS — K219 Gastro-esophageal reflux disease without esophagitis: Secondary | ICD-10-CM | POA: Diagnosis not present

## 2018-05-15 DIAGNOSIS — I25710 Atherosclerosis of autologous vein coronary artery bypass graft(s) with unstable angina pectoris: Secondary | ICD-10-CM | POA: Insufficient documentation

## 2018-05-15 DIAGNOSIS — I2510 Atherosclerotic heart disease of native coronary artery without angina pectoris: Secondary | ICD-10-CM | POA: Diagnosis not present

## 2018-05-15 DIAGNOSIS — I5042 Chronic combined systolic (congestive) and diastolic (congestive) heart failure: Secondary | ICD-10-CM | POA: Diagnosis not present

## 2018-05-15 DIAGNOSIS — Z7982 Long term (current) use of aspirin: Secondary | ICD-10-CM | POA: Diagnosis not present

## 2018-05-15 DIAGNOSIS — I252 Old myocardial infarction: Secondary | ICD-10-CM | POA: Insufficient documentation

## 2018-05-15 DIAGNOSIS — E785 Hyperlipidemia, unspecified: Secondary | ICD-10-CM | POA: Insufficient documentation

## 2018-05-15 DIAGNOSIS — I255 Ischemic cardiomyopathy: Secondary | ICD-10-CM | POA: Diagnosis not present

## 2018-05-15 DIAGNOSIS — Z7902 Long term (current) use of antithrombotics/antiplatelets: Secondary | ICD-10-CM | POA: Diagnosis not present

## 2018-05-15 DIAGNOSIS — I2582 Chronic total occlusion of coronary artery: Secondary | ICD-10-CM | POA: Diagnosis not present

## 2018-05-15 DIAGNOSIS — Z87891 Personal history of nicotine dependence: Secondary | ICD-10-CM | POA: Insufficient documentation

## 2018-05-15 DIAGNOSIS — M792 Neuralgia and neuritis, unspecified: Secondary | ICD-10-CM

## 2018-05-15 HISTORY — PX: LEFT HEART CATH AND CORONARY ANGIOGRAPHY: CATH118249

## 2018-05-15 SURGERY — LEFT HEART CATH AND CORONARY ANGIOGRAPHY
Anesthesia: Moderate Sedation | Laterality: Left

## 2018-05-15 SURGERY — LEFT HEART CATH AND CORONARY ANGIOGRAPHY
Anesthesia: Moderate Sedation

## 2018-05-15 MED ORDER — GABAPENTIN 100 MG PO CAPS: 100.0000 mg | ORAL_CAPSULE | Freq: Four times a day (QID) | ORAL | 2 refills | Status: DC

## 2018-05-15 MED ORDER — HEPARIN SODIUM (PORCINE) 1000 UNIT/ML IJ SOLN
INTRAMUSCULAR | Status: DC | PRN
  Administered 2018-05-15: 2000 [IU] via INTRAVENOUS

## 2018-05-15 MED ORDER — SODIUM CHLORIDE 0.9% FLUSH: 3.0000 mL | INTRAVENOUS | Status: DC | PRN

## 2018-05-15 MED ORDER — HEPARIN SODIUM (PORCINE) 1000 UNIT/ML IJ SOLN
INTRAMUSCULAR | Status: AC
  Filled 2018-05-15: qty 1

## 2018-05-15 MED ORDER — LIDOCAINE HCL (PF) 1 % IJ SOLN
INTRAMUSCULAR | Status: AC
  Filled 2018-05-15: qty 30

## 2018-05-15 MED ORDER — MIDAZOLAM HCL 2 MG/2ML IJ SOLN
INTRAMUSCULAR | Status: AC
  Filled 2018-05-15: qty 2

## 2018-05-15 MED ORDER — MIDAZOLAM HCL 2 MG/2ML IJ SOLN
INTRAMUSCULAR | Status: DC | PRN
  Administered 2018-05-15: 1 mg via INTRAVENOUS

## 2018-05-15 MED ORDER — FENTANYL CITRATE (PF) 100 MCG/2ML IJ SOLN
INTRAMUSCULAR | Status: AC
  Filled 2018-05-15: qty 2

## 2018-05-15 MED ORDER — SODIUM CHLORIDE 0.9 % IV SOLN
INTRAVENOUS | Status: DC
  Administered 2018-05-15: 10:00:00 via INTRAVENOUS

## 2018-05-15 MED ORDER — HYDRALAZINE HCL 20 MG/ML IJ SOLN
INTRAMUSCULAR | Status: AC
  Filled 2018-05-15: qty 1

## 2018-05-15 MED ORDER — SODIUM CHLORIDE 0.9 % IV SOLN: 250.0000 mL | INTRAVENOUS | Status: DC | PRN

## 2018-05-15 MED ORDER — SODIUM CHLORIDE 0.9% FLUSH: 3.0000 mL | Freq: Two times a day (BID) | INTRAVENOUS | Status: DC

## 2018-05-15 MED ORDER — HYDRALAZINE HCL 20 MG/ML IJ SOLN
INTRAMUSCULAR | Status: DC | PRN
  Administered 2018-05-15: 10 mg via INTRAVENOUS

## 2018-05-15 MED ORDER — HEPARIN (PORCINE) IN NACL 1000-0.9 UT/500ML-% IV SOLN
INTRAVENOUS | Status: AC
  Filled 2018-05-15: qty 500

## 2018-05-15 MED ORDER — IOPAMIDOL (ISOVUE-300) INJECTION 61%
INTRAVENOUS | Status: DC | PRN
  Administered 2018-05-15: 125 mL via INTRA_ARTERIAL

## 2018-05-15 MED ORDER — FENTANYL CITRATE (PF) 100 MCG/2ML IJ SOLN
INTRAMUSCULAR | Status: DC | PRN
  Administered 2018-05-15: 25 ug via INTRAVENOUS

## 2018-05-15 SURGICAL SUPPLY — 15 items
CANNULA 5F STIFF (CANNULA) ×2 IMPLANT
CATH INFINITI 5FR JL4 (CATHETERS) ×2 IMPLANT
CATH INFINITI JR4 5F (CATHETERS) ×2 IMPLANT
DEVICE CLOSURE MYNXGRIP 5F (Vascular Products) ×2 IMPLANT
KIT MANI 3VAL PERCEP (MISCELLANEOUS) ×3 IMPLANT
NDL PERC 18GX7CM (NEEDLE) IMPLANT
NDL PERC 21GX4CM (NEEDLE) IMPLANT
NEEDLE PERC 18GX7CM (NEEDLE) ×3 IMPLANT
NEEDLE PERC 21GX4CM (NEEDLE) IMPLANT
PACK CARDIAC CATH (CUSTOM PROCEDURE TRAY) ×3 IMPLANT
SHEATH AVANTI 5FR X 11CM (SHEATH) ×2 IMPLANT
SHEATH RAIN RADIAL 21G 6FR (SHEATH) IMPLANT
WIRE GUIDERIGHT .035X150 (WIRE) ×2 IMPLANT
WIRE HITORQ VERSACORE ST 145CM (WIRE) ×2 IMPLANT
WIRE ROSEN-J .035X260CM (WIRE) ×2 IMPLANT

## 2018-05-15 NOTE — Interval H&P Note (Signed)
History and Physical Interval Note:  05/15/2018 11:04 AM  Matthew [name]  has presented today for surgery, with the diagnosis of LT Cath    CAD w unstable angina  The various methods of treatment have been discussed with the patient and family. After consideration of risks, benefits and other options for treatment, the patient has consented to  Procedure(s): LEFT HEART CATH AND CORONARY ANGIOGRAPHY (Left) as a surgical intervention .  The patient's history has been reviewed, patient examined, no change in status, stable for surgery.  I have reviewed the patient's chart and labs.  Questions were answered to the patient's satisfaction.     [name]

## 2018-05-15 NOTE — Telephone Encounter (Signed)
Spoke with daughter re Gabapentin Rx.  I did tell her that Gabapentin was escribed to [facility], however, they needed it to go [facility] order pharmacy.  I took [employer] out so that the Rx would go to [facility] and will forward to [employer] NP.

## 2018-05-15 NOTE — Telephone Encounter (Signed)
sent 

## 2018-05-15 NOTE — Discharge Instructions (Signed)

## 2018-05-17 ENCOUNTER — Telehealth: Payer: Self-pay | Admitting: Cardiovascular Disease

## 2018-05-17 DIAGNOSIS — I739 Peripheral vascular disease, unspecified: Secondary | ICD-10-CM

## 2018-05-17 DIAGNOSIS — I493 Ventricular premature depolarization: Secondary | ICD-10-CM

## 2018-05-17 NOTE — Telephone Encounter (Signed)
Go ahead and schedule carotid Doppler for left subclavian artery stenosis. Also 24-hour Holter monitor for PVCs.

## 2018-05-17 NOTE — Telephone Encounter (Signed)
Patient's daughter has been notified that the orders have been placed and that someone from scheduling will be in touch to set them up. She verbalized her understanding.

## 2018-05-17 NOTE — Telephone Encounter (Signed)
Left a message to call back.

## 2018-05-17 NOTE — Telephone Encounter (Signed)
Patient daughter calling States that they saw Dr. Fletcher Anon in the hospital and he mentioned getting a heart monitor and an ultrasound done - no orders placed  Also patients daughter would like to discuss patients labs  Please call to discuss

## 2018-05-17 NOTE — Telephone Encounter (Signed)
Patient's daughter called inquiring about the 24 hour Holter monitor and Carotid that was mentioned when the patient had his cardiac cath. She would like to know if these need to be done and if they should try and be done before his follow up appointment on 05/22/18 with Christell Faith, PA.

## 2018-05-17 NOTE — Telephone Encounter (Signed)
Patient daughter returning call  °

## 2018-05-19 NOTE — Progress Notes (Signed)
Cardiology Office Note Date:  05/22/2018  Patient ID:  Matthew [name], Matthew [name] August 04, 1936, MRN [id number] PCP:  Antionette Char, MD  Cardiologist:  Dr. Fletcher Anon, MD    Chief Complaint: Follow up  History of Present Illness: Matthew [name] is a 82 y.o. male with history of CAD s/p CABG in 1991 with redo bypass in 2004, aortic stenosis s/p TAVR in 05/2016, chronic combined systolic and diastolic CHF, stable angina, PAD with bilateral common iliac artery kissing stenting in 2015, HTN, OSA, and GERD who presents forfollow-up of diagnostic LHC.   R/LHC in 05/2016, prior to his TAVR, showed severe underlying 3-vessel CAD with patent grafts (proximal LAD 100% stenosed, mid LCx 100% stenosed, OM2 80% stenosed, proximal RCA 100% stenosed, LIMA-LAD was unable to be cannulated and was anatomically normal and normal in caliber, RIMA-D2 was visualized by non-selective angiography and was anatomically normal, VG-OM2 with a patent, previously placed proximal graft stent, 50% mid VG-OM2 stenosis, VG-OM3 minimal luminal irregularities, VG-RPDA mild diffuse disease), RHC showed high normal pulmonary pressure and filling pressure, normal cardiac output. Continued medical management was advised. At his follow up with Dr. Fletcher Anon in 05/2017 he was doing well, though continued to note intermittent episodes of substernal chest tightness when he was late with taking his medications.  TTE from 05/2017 showed an EF of 45-50%, mild concentric LVH, Gr1DD, septal dyssnergy, TAVR prosthesis was noted and functioning normally, moderately to severely dilated left atrium, mildly dilated right atrium. When compared to prior TTE from 06/2016 his EF had dropped from 55% and the LV was more dilated. He was seen in the office on 04/11/2018 for routine follow-up with a weight of 192 pounds, (which was stable when compared to his weight from 05/13/2017).  He continued to note intermittent chest pains if he was late taking his medications. There was also  some exertional chest pain. He underwent Lexiscan Myoview on 04/14/2018 that showed a medium defect of moderate severity present in the basal inferior septal location with findings consistent with prior MI, EF 36%, left bundle branch block was noted, this was an intermediate risk study.  Follow-up echocardiogram on 05/03/2018 showed further reduction of his EF to 30 to 35%, diffuse hypokinesis, grade 2 diastolic dysfunction, bioprosthetic aortic valve was noted with a mean gradient of 14 mmHg, mildly dilated aortic root, mild MR, moderately dilated left atrium, RV systolic function was normal, PASP was normal.  Case was discussed with his primary cardiologist who recommended optimization of heart failure medications.  Patient was transitioned from metoprolol to carvedilol. In follow up on 5/31, he continued to note a functional decline with associated exertional chest pain. BP precluded further escalation of medications. Patient's family requested LHC. He underwent LHC on 05/15/2018 that showed significant underlying 3-vessel CAD with a patent LIMA-LAD, patent RIMA-diagonal, patent SVG-OM3 and patent SVG-rPDA. The SVG-OM2 was noted to be occluded which was a new finding; however the OM2 was receiving retrograde flow from the SVG-OM3. There was also noted to be possible significant left subclavian artery stenosis, though no gradient was noted with pullback. He was noted to have frequent PVCs before and during the cath. He has been scheduled for a carotid Doppler with focus on the left subclavian artery as well as a 24-hour Holter.   He comes in today accompanied by his daughter.  He has done well since his cardiac catheterization.  He has had a couple episodes of chest pain following moderate exertion though not as severe as his episodes  leading up to his cardiac catheterization.  He has been compliant with medications.  He is not checking his blood pressure at home.  Weight remains stable.  No complications from the  right femoral cardiac catheterization site.  He does not yet have his Holter monitor.  Carotid artery Doppler yet to be scheduled.  Past Medical History:  Diagnosis Date  . AS (aortic stenosis)    a. Echo 6/10: EF 55% mild AS; b. echo 06/2015; EF 55-60%, GR1DD, moderate AS, Peak velocity (S): 346 cm/s. Mean gradientS): 28 mm Hg. Peak gradient (S): 48 mm Hg. Valve area (VTI): 1.18 cm2;   c. Echo 4/17 - mild LVH, EF 55-60%, no RWMA, Gr 1 DD, mod to severe AS (mean 28 mmHg, peak 46 mmHg), mild LAE  . CAD (coronary artery disease)    a. MI 1996 w/ CABG 1996; b.redo CABG 06/2003; c. Myoview 7/09: EF 54% inferobasal infarct, no ischemia. Myoview 6/10 EF 43% inf wall infarct. no ischemia; c. cath 8/16 s/p DES to VG-OM2. OTW 3VD w/ patent VG->RPDA, VG->OM3,  & LIMA->LAD.  EF 55-65%.  . Carotid bruit    2009 0-39% on dopplers bilatrally  . GERD (gastroesophageal reflux disease)   . History of kidney stones   . HOH (hard of hearing)   . HTN (hypertension)   . Hyperlipidemia   . Myocardial infarction ([place]) 1996  . Nephrolithiasis   . OSA (obstructive sleep apnea)   . PAD (peripheral artery disease) ([place]) 02/2014   Subtotal occlusion of right common iliac artery and 70% stenosis in the left common iliac artery. Status post bilateral kissing stent placement. 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 Shortness of breath    with exertion  . Thrombocytopenia ([place])     Past Surgical History:  Procedure Laterality Date  . ABDOMINAL AORTAGRAM N/A 03/06/2014   Procedure: ABDOMINAL Maxcine Ham;  Surgeon: Wellington Hampshire, MD;  Location: [facility];  Service: Cardiovascular;  Laterality: N/A;  . CARDIAC CATHETERIZATION  02/2014   Severe three-vessel coronary artery disease with patent grafts.   Marland Kitchen CARDIAC CATHETERIZATION N/A 07/31/2015   Procedure:  Right/Left Heart Cath and Coronary/Graft Angiography;  Surgeon: Wellington Hampshire, MD;  Location: [facility];  Service: Cardiovascular;  Laterality: N/A;  . CARDIAC CATHETERIZATION N/A 07/31/2015   Procedure: Coronary Stent Intervention;  Surgeon: Wellington Hampshire, MD;  Location: [facility];  Service: Cardiovascular;  Laterality: N/A;  . CARDIAC CATHETERIZATION N/A 05/26/2016   Procedure: Right/Left Heart Cath and Coronary Angiography;  Surgeon: Wellington Hampshire, MD;  Location: [facility];  Service: Cardiovascular;  Laterality: N/A;  . CORONARY ARTERY BYPASS GRAFT  1991   at [facility]. Redone 2004-3 vessels 1st time and 4 second time  . CYSTO     2/3 times for kidney stones  . INGUINAL HERNIA REPAIR  11/10/2011   Procedure: HERNIA REPAIR INGUINAL ADULT;  Surgeon: Donato Heinz;  Location: [facility];  Service: General;  Laterality: Right;  . LEFT HEART CATH AND CORONARY ANGIOGRAPHY Left 05/15/2018   Procedure: LEFT HEART CATH AND CORONARY ANGIOGRAPHY;  Surgeon: Wellington Hampshire, MD;  Location: [facility];  Service: Cardiovascular;  Laterality: Left;  . LEFT HEART CATHETERIZATION WITH CORONARY/GRAFT ANGIOGRAM N/A 03/06/2014   Procedure: LEFT HEART CATHETERIZATION WITH Beatrix Fetters;  Surgeon: Wellington Hampshire, MD;  Location: [facility];  Service: Cardiovascular;  Laterality: N/A;  . STOMACH SURGERY     removal of gastric ulcers  . TEE WITHOUT CARDIOVERSION N/A 06/01/2016   Procedure: TRANSESOPHAGEAL ECHOCARDIOGRAM (TEE);  Surgeon: Sherren Mocha, MD;  Location: [facility];  Service: Open Heart Surgery;  Laterality: N/A;  . TRANSCATHETER AORTIC VALVE REPLACEMENT, TRANSFEMORAL N/A 06/01/2016   Procedure: TRANSCATHETER AORTIC VALVE REPLACEMENT, TRANSFEMORAL;  Surgeon: Sherren Mocha, MD;  Location: [facility];  Service: Open Heart Surgery;  Laterality: N/A;    Current Meds  Medication Sig  . acetaminophen (TYLENOL) 325 MG tablet Take 650 mg by mouth daily as needed for  mild pain or headache.   Marland Kitchen aspirin EC 81 MG tablet Take 1 tablet (81 mg total) by mouth daily.  Marland Kitchen atorvastatin (LIPITOR) 80 MG tablet Take 1 tablet (80 mg total) by mouth daily.  . carvedilol (COREG) 3.125 MG tablet Take 1 tablet (3.125 mg total) by mouth 2 (two) times daily with a meal.  . clopidogrel (PLAVIX) 75 MG tablet Take 1 tablet (75 mg total) by mouth daily with breakfast.  . gabapentin (NEURONTIN) 100 MG capsule Take 1-3 capsules (100-300 mg total) by mouth 4 (four) times daily. Follow written titration schedule.  . hydrochlorothiazide (HYDRODIURIL) 25 MG tablet Take 1 tablet (25 mg total) by mouth daily.  . isosorbide dinitrate (ISORDIL) 10 MG tablet Take 2 tablets (20 mg total) by mouth every morning. Take 10 mg by mouth in the afternoon  . lisinopril (PRINIVIL,ZESTRIL) 10 MG tablet Take 1 tablet (10 mg total) by mouth daily.  . Multiple Vitamins-Minerals (PRESERVISION AREDS PO) Take 1 tablet by mouth daily.   . nitroGLYCERIN (NITROSTAT) 0.4 MG SL tablet Place 0.4 mg under the tongue every 5 (five) minutes as needed for chest pain.  . pantoprazole (PROTONIX) 40 MG tablet Take 20 mg by mouth daily.  . tamsulosin (FLOMAX) 0.4 MG CAPS capsule Take 1 capsule (0.4 mg total) by mouth daily.  . TURMERIC PO Take at bedtime by mouth.    Allergies:   Patient has no known allergies.   Social History:  The patient  reports that he quit smoking about 28 years ago. His smoking use included cigarettes. He has a 50.00 pack-year smoking history. He has never used smokeless tobacco. He reports that he does not drink alcohol or use drugs.   Family History:  The patient's family history includes Heart disease in his father and mother.  ROS:   Review of Systems  Constitutional: Positive for malaise/fatigue. Negative for chills, diaphoresis, fever and weight loss.  HENT: Negative for congestion.   Eyes: Negative for discharge and redness.  Respiratory: Positive for shortness of breath. Negative for  cough, hemoptysis, sputum production and wheezing.   Cardiovascular: Positive for chest pain. Negative for palpitations, orthopnea, claudication, leg swelling and PND.  Gastrointestinal: Negative for abdominal pain, blood in stool, heartburn, melena, nausea and vomiting.  Genitourinary: Negative for hematuria.  Musculoskeletal: Negative for falls and myalgias.  Skin: Negative for rash.  Neurological: Positive for weakness. Negative for dizziness, tingling, tremors, sensory change, speech change, focal weakness and loss of consciousness.  Endo/Heme/Allergies: Does not bruise/bleed easily.  Psychiatric/Behavioral: Negative for substance abuse. The patient is not nervous/anxious.   All other systems reviewed and are negative.    PHYSICAL EXAM:  VS:  BP (!) 112/50 (BP Location: Left Arm, Patient Position: Sitting, Cuff Size: Normal)   Pulse 72   Ht 5\' 9"  (1.753 m)   Wt 190 lb (86.2 kg)  BMI 28.06 kg/m  BMI: Body mass index is 28.06 kg/m.  Physical Exam  Constitutional: He is oriented to person, place, and time. He appears well-developed and well-nourished.  HENT:  Head: Normocephalic and atraumatic.  Eyes: Right eye exhibits no discharge. Left eye exhibits no discharge.  Neck: Normal range of motion. No JVD present.  Cardiovascular: Normal rate, S1 normal and S2 normal. An irregular rhythm present. Exam reveals no distant heart sounds, no friction rub, no midsystolic click and no opening snap.  Murmur heard.  Harsh midsystolic murmur is present with a grade of 1/6 at the upper right sternal border radiating to the neck. Pulses:      Posterior tibial pulses are 2+ on the right side, and 2+ on the left side.  Right femoral cardiac cath site with minimal bruising.  No active bleeding, swelling, erythema, warmth, or tenderness to palpation.  No bruit.  Pulmonary/Chest: Effort normal and breath sounds normal. No respiratory distress. He has no decreased breath sounds. He has no wheezes. He  has no rales. He exhibits no tenderness.  Abdominal: Soft. He exhibits no distension. There is no tenderness.  Musculoskeletal: He exhibits no edema.  Neurological: He is alert and oriented to person, place, and time.  Skin: Skin is warm and dry. No cyanosis. Nails show no clubbing.  Psychiatric: He has a normal mood and affect. His speech is normal and behavior is normal. Judgment and thought content normal.     EKG:  Was ordered and interpreted by me today. Shows NSR, 72 bpm, LBBB (old), rare PVC  Recent Labs: 08/31/2017: Magnesium 1.8 04/11/2018: ALT 14 05/12/2018: BUN 21; Creatinine, Ser 1.03; Hemoglobin 12.2; Platelets 143; Potassium 3.6; Sodium 135  04/11/2018: Chol/HDL Ratio 3.6; Cholesterol, Total 160; HDL 44; LDL Calculated 63; Triglycerides 263   Estimated Creatinine Clearance: 63.3 mL/min (by C-G formula based on SCr of 1.03 mg/dL).   Wt Readings from Last 3 Encounters:  05/22/18 190 lb (86.2 kg)  05/15/18 191 lb (86.6 kg)  05/12/18 191 lb (86.6 kg)     Other studies reviewed: Additional studies/records reviewed today include: summarized above  ASSESSMENT AND PLAN:  1. CAD involving bypass grafts with stable angina: Currently without any symptoms concerning for angina.  Left heart catheterization demonstrating 3 out of 4 patent grafts as above.  Some of his unstable angina certainly could be stemming from small vessel disease versus his one occluded graft versus possible decreased flow into the LIMA to LAD secondary to subclavian artery stenosis.  For now, continue isosorbide dinitrate 20 mg in the morning and 10 mg in the afternoon.  Continue carvedilol.  Could consider Ranexa at follow-up.  Await carotid artery Doppler and Holter monitor as below.  2. Chronic systolic CHF due to ischemic cardiomyopathy with possible contribution from ventricular ectopy: He does not appear grossly volume overloaded at this time.  Continue carvedilol 3.125 mg twice daily, HCTZ 25 mg daily, and  lisinopril 10 mg daily.  Hold off on initiating spironolactone at this time given his history of relative hypotension.  Would look to escalate beta-blocker therapy if he is found to have a clinically significant number of PVCs on Holter monitoring which could be contributing to his cardiomyopathy.  3. Frequent PVCs: Asymptomatic.  Check magnesium, BMP, TSH, and CBC.  Placed 24-hour Holter on patient today to quantify ventricular ectopy.  Should he be noted to have clinically significant PVCs we would look to escalate carvedilol if his blood pressure allows.  However, we may need  to initiate antiarrhythmic therapy given his relative hypotension.  Frequent PVCs could be playing a role in some of his cardiomyopathy.  4. Subclavian artery stenosis: Uncertain if this is playing a role in some of his angina.  He is scheduled for carotid artery Doppler with focus on the left subclavian artery to further evaluate clinical significance of this stenosis noted on cardiac cath.  Should this be found to be clinically significant, he would need referral to vascular surgery.  5. Aortic stenosis status post TAVR: Stable prosthesis by echo as above.  Continue to monitor clinically.  6. Hypertension: Blood pressure well controlled today.  Blood pressure is slightly higher following recent decrease of lisinopril to 10 mg daily at last office visit.  Continue lisinopril 10 mg, carvedilol 3.125 mg twice daily, HCTZ 25 mg daily, and isosorbide dinitrate 20 mg in the morning and 10 mg in the afternoon.    7. Hyperlipidemia: LDL at goal with normal liver function from 03/2018.  Continue Lipitor 80 mg daily.  Disposition: F/u with Dr. Fletcher Anon following Holter and carotid artery Doppler.  Current medicines are reviewed at length with the patient today.  The patient did not have any concerns regarding medicines.  Signed, Christell Faith, PA-C 05/22/2018 11:05 AM     [facility] [address] [address]  [place], [place] [address] [phone]

## 2018-05-22 ENCOUNTER — Ambulatory Visit (INDEPENDENT_AMBULATORY_CARE_PROVIDER_SITE_OTHER): Payer: Medicare HMO

## 2018-05-22 ENCOUNTER — Ambulatory Visit: Payer: Medicare HMO | Admitting: Physician Assistant

## 2018-05-22 ENCOUNTER — Encounter

## 2018-05-22 ENCOUNTER — Encounter: Payer: Self-pay | Admitting: Physician Assistant

## 2018-05-22 VITALS — BP 112/50 | HR 72 | Ht 69.0 in | Wt 190.0 lb

## 2018-05-22 DIAGNOSIS — Z79899 Other long term (current) drug therapy: Secondary | ICD-10-CM

## 2018-05-22 DIAGNOSIS — I771 Stricture of artery: Secondary | ICD-10-CM | POA: Diagnosis not present

## 2018-05-22 DIAGNOSIS — I25118 Atherosclerotic heart disease of native coronary artery with other forms of angina pectoris: Secondary | ICD-10-CM

## 2018-05-22 DIAGNOSIS — I359 Nonrheumatic aortic valve disorder, unspecified: Secondary | ICD-10-CM

## 2018-05-22 DIAGNOSIS — E785 Hyperlipidemia, unspecified: Secondary | ICD-10-CM

## 2018-05-22 DIAGNOSIS — I5022 Chronic systolic (congestive) heart failure: Secondary | ICD-10-CM

## 2018-05-22 DIAGNOSIS — I10 Essential (primary) hypertension: Secondary | ICD-10-CM

## 2018-05-22 DIAGNOSIS — I493 Ventricular premature depolarization: Secondary | ICD-10-CM

## 2018-05-22 NOTE — Patient Instructions (Signed)
Medication Instructions:  Your physician recommends that you continue on your current medications as directed. Please refer to the Current Medication list given to you today.   Labwork: Your physician recommends that you return for lab work in: TODAY (CBC, BMET, MG TSH).   Testing/Procedures: Your physician has recommended that you wear a 24 HOUR holter monitor. Holter monitors are medical devices that record the heart's electrical activity. Doctors most often use these monitors to diagnose arrhythmias. Arrhythmias are problems with the speed or rhythm of the heartbeat. The monitor is a small, portable device. You can wear one while you do your normal daily activities. This is usually used to diagnose what is causing palpitations/syncope (passing out).   Your physician has requested that you have a carotid duplex. This test is an ultrasound of the carotid arteries in your neck. It looks at blood flow through these arteries that supply the brain with blood. Allow one hour for this exam. There are no restrictions or special instructions.    Follow-Up: Your physician recommends that you schedule a follow-up appointment in: [place].   If you need a refill on your cardiac medications before your next appointment, please call your pharmacy.    Holter Monitoring A Holter monitor is a small device that is used to detect abnormal heart rhythms. It clips to your clothing and is connected by wires to flat, sticky disks (electrodes) that attach to your chest. It is worn continuously for 24-48 hours. Follow these instructions at home:  Wear your Holter monitor at all times, even while exercising and sleeping, for as long as directed by your health care provider.  Make sure that the Holter monitor is safely clipped to your clothing or close to your body as recommended by your health care provider.  Do not get the monitor or wires wet.  Do not put body lotion or moisturizer on  your chest.  Keep your skin clean.  Keep a diary of your daily activities, such as walking and doing chores. If you feel that your heartbeat is abnormal or that your heart is fluttering or skipping a beat: ? Record what you are doing when it happens. ? Record what time of day the symptoms occur.  Return your Holter monitor as directed by your health care provider.  Keep all follow-up visits as directed by your health care provider. This is important. Get help right away if:  You feel lightheaded or you faint.  You have trouble breathing.  You feel pain in your chest, upper arm, or jaw.  You feel sick to your stomach and your skin is pale, cool, or damp.  You heartbeat feels unusual or abnormal. This information is not intended to replace advice given to you by your health care provider. Make sure you discuss any questions you have with your health care provider. Document Released: 08/27/2004 Document Revised: 05/06/2016 Document Reviewed: 07/08/2014 Elsevier Interactive Patient Education  [employer].

## 2018-05-23 ENCOUNTER — Other Ambulatory Visit: Payer: Self-pay | Admitting: *Deleted

## 2018-05-23 LAB — MAGNESIUM: MAGNESIUM: 1.9 mg/dL (ref 1.6–2.3)

## 2018-05-23 LAB — BASIC METABOLIC PANEL
BUN/Creatinine Ratio: 16 (ref 10–24)
BUN: 18 mg/dL (ref 8–27)
CALCIUM: 9.5 mg/dL (ref 8.6–10.2)
CHLORIDE: 102 mmol/L (ref 96–106)
CO2: 23 mmol/L (ref 20–29)
Creatinine, Ser: 1.13 mg/dL (ref 0.76–1.27)
GFR calc non Af Amer: 61 mL/min/{1.73_m2} (ref >59)
GFR, EST AFRICAN AMERICAN: 71 mL/min/{1.73_m2} (ref >59)
GLUCOSE: 128 mg/dL — AB (ref 65–99)
POTASSIUM: 3.8 mmol/L (ref 3.5–5.2)
Sodium: 139 mmol/L (ref 134–144)

## 2018-05-23 LAB — CBC WITH DIFFERENTIAL/PLATELET
BASOS ABS: 0.1 10*3/uL (ref 0.0–0.2)
Basos: 1 %
EOS (ABSOLUTE): 0.1 10*3/uL (ref 0.0–0.4)
Eos: 1 %
Hematocrit: 37.4 % — ABNORMAL LOW (ref 37.5–51.0)
Hemoglobin: 12 g/dL — ABNORMAL LOW (ref 13.0–17.7)
IMMATURE GRANS (ABS): 0 10*3/uL (ref 0.0–0.1)
IMMATURE GRANULOCYTES: 0 %
LYMPHS: 21 %
Lymphocytes Absolute: 1.5 10*3/uL (ref 0.7–3.1)
MCH: 27.8 pg (ref 26.6–33.0)
MCHC: 32.1 g/dL (ref 31.5–35.7)
MCV: 87 fL (ref 79–97)
Monocytes Absolute: 0.7 10*3/uL (ref 0.1–0.9)
Monocytes: 10 %
NEUTROS PCT: 67 %
Neutrophils Absolute: 4.9 10*3/uL (ref 1.4–7.0)
PLATELETS: 155 10*3/uL (ref 150–450)
RBC: 4.31 x10E6/uL (ref 4.14–5.80)
RDW: 13.4 % (ref 12.3–15.4)
WBC: 7.2 10*3/uL (ref 3.4–10.8)

## 2018-05-23 LAB — TSH: TSH: 2.74 u[IU]/mL (ref 0.450–4.500)

## 2018-05-23 MED ORDER — HYDROCHLOROTHIAZIDE 25 MG PO TABS: 25.0000 mg | ORAL_TABLET | Freq: Every day | ORAL | 1 refills | Status: DC

## 2018-05-23 MED ORDER — ATORVASTATIN CALCIUM 80 MG PO TABS: 80.0000 mg | ORAL_TABLET | Freq: Every day | ORAL | 1 refills | Status: DC

## 2018-05-23 MED ORDER — CLOPIDOGREL BISULFATE 75 MG PO TABS: 75.0000 mg | ORAL_TABLET | Freq: Every day | ORAL | 1 refills | Status: DC

## 2018-05-25 ENCOUNTER — Telehealth: Payer: Self-pay | Admitting: Cardiovascular Disease

## 2018-05-25 ENCOUNTER — Other Ambulatory Visit: Payer: Self-pay | Admitting: *Deleted

## 2018-05-25 NOTE — Telephone Encounter (Signed)
Spoke with patients daughter and yesterday he mowed 4 yards yesterday and when he got back he fed chickens and then had some chest pain. He did do some heavy lifting during this time as well. Reviewed with Dr. Fletcher Anon and he advised patient to slow down some. Reviewed signs and symptoms which would require immediate evaluation in the ED and advised that she please call back if he should have any continued problems. She verbalized understanding of our conversation with no further questions at this time.

## 2018-05-25 NOTE — Telephone Encounter (Signed)
lmov to schedule patient to schedule sooner opening on 6-14

## 2018-05-25 NOTE — Telephone Encounter (Signed)
-----   Message from Valora Corporal, RN sent at 05/25/2018 12:28 PM EDT ----- Could you please put this patient on the cancellation list for carotid ultrasound?   Thanks, Lesleigh Noe.

## 2018-05-25 NOTE — Telephone Encounter (Signed)
Pt daughter states that pt took aspirin yesterday after exerting himself.  Pt c/o of Chest Pain: STAT if CP now or developed within 24 hours  1. Are you having CP right now? no  2. Are you experiencing any other symptoms (ex. SOB, nausea, vomiting, sweating)?  No, just chest pain  3. How long have you been experiencing CP? yesterday  4. Is your CP continuous or coming and going? Comes and goe  5. Have you taken Nitroglycerin? No but he did take a aspirin, due to Nitro giving him a headache ?

## 2018-05-26 ENCOUNTER — Other Ambulatory Visit: Payer: Self-pay | Admitting: Cardiovascular Disease

## 2018-05-26 ENCOUNTER — Ambulatory Visit (INDEPENDENT_AMBULATORY_CARE_PROVIDER_SITE_OTHER): Payer: Medicare HMO

## 2018-05-26 DIAGNOSIS — I771 Stricture of artery: Secondary | ICD-10-CM

## 2018-05-26 NOTE — Telephone Encounter (Signed)
Done

## 2018-05-30 ENCOUNTER — Telehealth: Payer: Self-pay | Admitting: Cardiovascular Disease

## 2018-05-30 NOTE — Telephone Encounter (Signed)
Pt daughter calling stating they received a notice from [facility] mail order That Pantoprazole Sodium was denied for refill  She is asking if we can please call and see what was the reason it was denied  They always get it from them  Please advise

## 2018-05-31 ENCOUNTER — Other Ambulatory Visit: Payer: Self-pay | Admitting: *Deleted

## 2018-05-31 ENCOUNTER — Encounter

## 2018-05-31 MED ORDER — PANTOPRAZOLE SODIUM 40 MG PO TBEC: 40.0000 mg | DELAYED_RELEASE_TABLET | Freq: Every day | ORAL | 3 refills | Status: DC

## 2018-05-31 NOTE — Telephone Encounter (Signed)
Requested Prescriptions   Signed Prescriptions Disp Refills  . pantoprazole (PROTONIX) 40 MG tablet 90 tablet 3    Sig: Take 1 tablet (40 mg total) by mouth daily.    Authorizing Provider: Rise Mu    Ordering User: Britt Bottom

## 2018-05-31 NOTE — Telephone Encounter (Signed)
Pt daughter is calling back states pt is taking 40 mg 1 time a day. Pt does have a 1 month supply. She states just send to mail order.

## 2018-05-31 NOTE — Telephone Encounter (Signed)
Spoke with daughter to confirm how pt is taking his Protonix. She will return call to confirm.

## 2018-06-01 ENCOUNTER — Ambulatory Visit
Admission: RE | Admit: 2018-06-01 | Discharge: 2018-06-01 | Disposition: A | Discharge status: Home / Self Care | Payer: Medicare HMO | Source: Ambulatory Visit | Attending: Physician Assistant | Admitting: Physician Assistant

## 2018-06-01 DIAGNOSIS — I493 Ventricular premature depolarization: Secondary | ICD-10-CM | POA: Diagnosis not present

## 2018-06-01 NOTE — Telephone Encounter (Signed)
Patient daughter calling for results  Please call

## 2018-06-01 NOTE — Telephone Encounter (Signed)
Spoke with patients daughter per release form and she wanted to know the results from his carotid and his holter monitor. Reviewed preliminary findings for the carotid but advised that provider still needs to review. She verbalized understanding with no further questions at this time. Apologized for the delay in these results and that I would follow up on these with the provider.

## 2018-06-14 ENCOUNTER — Telehealth: Payer: Self-pay | Admitting: *Deleted

## 2018-06-14 MED ORDER — CARVEDILOL 6.25 MG PO TABS: 6.2500 mg | ORAL_TABLET | Freq: Two times a day (BID) | ORAL | 3 refills | Status: DC

## 2018-06-14 NOTE — Telephone Encounter (Signed)
-----   Message from Wellington Hampshire, MD sent at 06/08/2018  4:07 PM EDT ----- Inform patient that Holter monitor showed moderate amount of PVCs. I recommend increasing carvedilol to 6.25 mg twice daily and stopping hydrochlorothiazide.  He has a follow up appointment with [name] in July.  We should consider switching lisinopril to Entresto and adding small dose spironolactone at that time.

## 2018-06-14 NOTE — Telephone Encounter (Signed)
Call placed to the patient to give results and medication changes. Per the patient request, his daughter was on the phone as well. Carvedilol 6.25 mg bid has been sent into [employer] and Hydrochlorothiazide has been discontinued. The patient has a follow up on 06/21/18. They have both verbalized their understanding.

## 2018-06-16 NOTE — Telephone Encounter (Signed)
Call returned to the wife. She stated that the patient never started the Carvedilol 3.125 mg. He has been taking Metoprolol 12.5 mg once daily. She has been advised to not make any medication changes since the medication changes were based on the patient taking Carvedilol 3.125 mg and not the Metoprolol. The patient will stop taking the hydrochlorothiazide.   The wife stated that the patient is not clear on what medications he was supposed to be taking and she cannot come to his appointment on 7/10. She has been advised to have him bring all of his medications to the appointment and an updated list will be provided for him to bring home to her.

## 2018-06-16 NOTE — Telephone Encounter (Signed)
Pt daughter Matthew [name] asking for calling back She states when we called earlier this week and she would just like to know again what changes we did to patient medications   Please call back

## 2018-06-17 NOTE — Telephone Encounter (Signed)
Continue Coreg 3.125 mg bid for now and will need to go over all the medications with him during office visit.

## 2018-06-19 MED ORDER — CARVEDILOL 3.125 MG PO TABS: 3.1250 mg | ORAL_TABLET | Freq: Two times a day (BID) | ORAL | Status: DC

## 2018-06-19 NOTE — Addendum Note (Signed)
Addended by: Ricci Barker on: 06/19/2018 08:36 AM   Modules accepted: Orders

## 2018-06-19 NOTE — Telephone Encounter (Signed)
Left a message to call back.

## 2018-06-19 NOTE — Telephone Encounter (Signed)
Left a message to call back. [facility] pharmacy has been called and the shipment of Carvedilol 6.25 mg has been cancelled. Carvedilol 3.125 mg bid has been changed in the chart. [employer] stated that they shipped the Carvedilol 3.125 mg bid on June 8th but the patient's wife stated that the patient never started this medication.

## 2018-06-20 NOTE — Telephone Encounter (Signed)
Left a message to call back.

## 2018-06-21 ENCOUNTER — Ambulatory Visit: Payer: Medicare HMO | Admitting: Physician Assistant

## 2018-06-21 ENCOUNTER — Encounter: Payer: Self-pay | Admitting: Physician Assistant

## 2018-06-21 VITALS — BP 100/58 | HR 59 | Ht 69.0 in | Wt 192.5 lb

## 2018-06-21 DIAGNOSIS — I10 Essential (primary) hypertension: Secondary | ICD-10-CM | POA: Diagnosis not present

## 2018-06-21 DIAGNOSIS — I359 Nonrheumatic aortic valve disorder, unspecified: Secondary | ICD-10-CM | POA: Diagnosis not present

## 2018-06-21 DIAGNOSIS — I493 Ventricular premature depolarization: Secondary | ICD-10-CM

## 2018-06-21 DIAGNOSIS — I25118 Atherosclerotic heart disease of native coronary artery with other forms of angina pectoris: Secondary | ICD-10-CM

## 2018-06-21 DIAGNOSIS — Z79899 Other long term (current) drug therapy: Secondary | ICD-10-CM | POA: Diagnosis not present

## 2018-06-21 DIAGNOSIS — I5022 Chronic systolic (congestive) heart failure: Secondary | ICD-10-CM | POA: Diagnosis not present

## 2018-06-21 DIAGNOSIS — I255 Ischemic cardiomyopathy: Secondary | ICD-10-CM | POA: Diagnosis not present

## 2018-06-21 MED ORDER — RANOLAZINE ER 500 MG PO TB12: 500.0000 mg | ORAL_TABLET | Freq: Two times a day (BID) | ORAL | 1 refills | Status: DC

## 2018-06-21 MED ORDER — TAMSULOSIN HCL 0.4 MG PO CAPS: 0.4000 mg | ORAL_CAPSULE | Freq: Every day | ORAL | 1 refills | Status: DC

## 2018-06-21 MED ORDER — RANOLAZINE ER 500 MG PO TB12: 500.0000 mg | ORAL_TABLET | Freq: Two times a day (BID) | ORAL | 3 refills | Status: DC

## 2018-06-21 MED ORDER — TAMSULOSIN HCL 0.4 MG PO CAPS: 0.4000 mg | ORAL_CAPSULE | Freq: Every day | ORAL | 3 refills | Status: DC

## 2018-06-21 NOTE — Progress Notes (Signed)
Cardiology Office Note Date:  06/21/2018  Patient ID:  Matthew [name], Matthew [name] 03-May-1936, MRN [id number] PCP:  Antionette Char, MD  Cardiologist:  Dr. Fletcher Anon, MD    Chief Complaint: Follow up  History of Present Illness: Matthew [name] is a 82 y.o. male with history of CAD s/p CABG in 1991 with redo bypass in 2004, aortic stenosis s/p TAVR in 02/4192,[id number] combined systolic and diastolic CHF, stable angina, PAD with bilateral common iliac artery kissing stenting in 2015, HTN, OSA, and GERD who presents forfollow-up of his CAD and CHF.   R/LHC in 05/2016, prior to his TAVR, showed severe underlying 3-vessel CAD with patent grafts (proximal LAD 100% stenosed, mid LCx 100% stenosed, OM2 80% stenosed, proximal RCA 100% stenosed, LIMA-LAD was unable to be cannulated and was anatomically normal and normal in caliber, RIMA-D2 was visualized by non-selective angiography and was anatomically normal, VG-OM2 with a patent, previously placed proximal graft stent, 50% mid VG-OM2 stenosis, VG-OM3 minimal luminal irregularities, VG-RPDA mild diffuse disease), RHC showed high normal pulmonary pressure and filling pressure, normal cardiac output. Continued medical management was advised. At his follow up with Dr. Fletcher Anon in 05/2017 he was doing well, though continued to note intermittent episodes of substernal chest tightness when he was late with taking his medications. TTE from 05/2017 showed an EF of 45-50%, mild concentric LVH, Gr1DD, septal dyssnergy, TAVR prosthesis was noted and functioning normally, moderately to severely dilated left atrium, mildly dilated right atrium. When compared to prior TTE from 06/2016 his EF had dropped from 55% and the LV was more dilated.He was seen in the office on 04/11/2018 for routine follow-up with a weight of 192 pounds, (which was stable when compared to his weight from 05/13/2017).Hecontinuedto note intermittent chest pains if he was late taking his medications. There was also  some exertional chest pain. He underwent Lexiscan Myoview on 04/14/2018 that showed a medium defect of moderate severity present in the basal inferior septal location with findings consistent with prior MI, EF 36%, left bundle branch block was noted, this was an intermediate risk study. Follow-up echocardiogram on 05/03/2018 showed further reduction of his EF to 30 to 35%, diffuse hypokinesis, grade 2 diastolic dysfunction, bioprosthetic aortic valve was noted with a mean gradient of 14 mmHg, mildly dilated aortic root, mild MR, moderately dilated left atrium, RV systolic function was normal, PASP was normal. Case was discussed with his primary cardiologist who recommended optimization of heart failure medications. Patient was transitioned from metoprolol to carvedilol. In follow up on 5/31, he continued to note a functional decline with associated exertional chest pain. BP precluded further escalation of medications. Patient's family requested LHC. He underwent LHC on 05/15/2018 that showed significant underlying 3-vessel CAD with a patent LIMA-LAD, patent RIMA-diagonal, patent SVG-OM3 and patent SVG-rPDA. The SVG-OM2 was noted to be occluded which was a new finding; however the OM2 was receiving retrograde flow from the SVG-OM3. There was also noted to be possible significant left subclavian artery stenosis, though no gradient was noted with pullback. He was noted to have frequent PVCs before and during the cath. Carotid artery ultrasound on 05/26/18, performed for the noted possibly left subclavian artery stenosis above, showed 1-39% bilateral ICA stenosis with normal flow dynamics in the bilateral subclavian arteries. Holter monitor from 05/2018 showed 5,600 PVCs representing 6% burden with occasional PACs and an average heart rate of 62 bpm. His Coreg was increased to 6.25 mg bid along with holding of his HCTZ.   Patient comes  in accompanied by his daughter today.  He is doing reasonably well from a cardiac  perspective.  He did have one episode of chest pain the day after mowing 4 lawns.  This episode of chest pain resolved with baby aspirin and Tylenol.  He did not notify his family about this episode and did not seek medical evaluation.  Patient did mow these same 4 lawns again on 7/9 without recurrence of symptoms.  He is not checking his blood pressure at home.  Medications were reviewed in detail and it was discovered the patient has continued to take both carvedilol and metoprolol.  He has also been taking Isordil 3 times daily rather than twice daily.  He requests a refill on his Flomax today.  Weight remains stable.  No orthopnea, cough, PND, abdominal distention, lower extremity swelling, or early satiety.  He continues to monitor his p.o. fluid and salt intake.  He does not feel his PVCs.  No dizziness, presyncope, or syncope.  No recent falls.   Past Medical History:  Diagnosis Date  . AS (aortic stenosis)    a. Echo 6/10: EF 55% mild AS; b. echo 06/2015; EF 55-60%, GR1DD, moderate AS, Peak velocity (S): 346 cm/s. Mean gradientS): 28 mm Hg. Peak gradient (S): 48 mm Hg. Valve area (VTI): 1.18 cm2;   c. Echo 4/17 - mild LVH, EF 55-60%, no RWMA, Gr 1 DD, mod to severe AS (mean 28 mmHg, peak 46 mmHg), mild LAE  . CAD (coronary artery disease)    a. MI 1996 w/ CABG 1996; b.redo CABG 06/2003; c. Myoview 7/09: EF 54% inferobasal infarct, no ischemia. Myoview 6/10 EF 43% inf wall infarct. no ischemia; c. cath 8/16 s/p DES to VG-OM2. OTW 3VD w/ patent VG->RPDA, VG->OM3,  & LIMA->LAD.  EF 55-65%.  . Carotid bruit    2009 0-39% on dopplers bilatrally  . GERD (gastroesophageal reflux disease)   . History of kidney stones   . HOH (hard of hearing)   . HTN (hypertension)   . Hyperlipidemia   . Myocardial infarction ([place]) 1996  . Nephrolithiasis   . OSA (obstructive sleep apnea)   . PAD (peripheral artery disease) ([place]) 02/2014   Subtotal occlusion of right common iliac artery and 70% stenosis in the left  common iliac artery. Status post bilateral kissing stent placement. 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 Shortness of breath    with exertion  . Thrombocytopenia ([place])     Past Surgical History:  Procedure Laterality Date  . ABDOMINAL AORTAGRAM N/A 03/06/2014   Procedure: ABDOMINAL Maxcine [place];  Surgeon: Wellington Hampshire, MD;  Location: [facility];  Service: Cardiovascular;  Laterality: N/A;  . CARDIAC CATHETERIZATION  02/2014   Severe three-vessel coronary artery disease with patent grafts.   Marland Kitchen CARDIAC CATHETERIZATION N/A 07/31/2015   Procedure: Right/Left Heart Cath and Coronary/Graft Angiography;  Surgeon: Wellington Hampshire, MD;  Location: [facility];  Service: Cardiovascular;  Laterality: N/A;  . CARDIAC CATHETERIZATION N/A 07/31/2015   Procedure: Coronary Stent Intervention;  Surgeon: Wellington Hampshire, MD;  Location: [facility];  Service: Cardiovascular;  Laterality: N/A;  . CARDIAC CATHETERIZATION N/A 05/26/2016   Procedure: Right/Left Heart Cath and Coronary Angiography;  Surgeon: Wellington Hampshire, MD;  Location: [place] [facility];  Service: Cardiovascular;  Laterality: N/A;  . CORONARY ARTERY BYPASS  GRAFT  1991   at [facility] Redone 2004-3 vessels 1st time and 4 second time  . CYSTO     2/3 times for kidney stones  . INGUINAL HERNIA REPAIR  11/10/2011   Procedure: HERNIA REPAIR INGUINAL ADULT;  Surgeon: Donato Heinz;  Location: [facility];  Service: General;  Laterality: Right;  . LEFT HEART CATH AND CORONARY ANGIOGRAPHY Left 05/15/2018   Procedure: LEFT HEART CATH AND CORONARY ANGIOGRAPHY;  Surgeon: Wellington Hampshire, MD;  Location: [place] [facility];  Service: Cardiovascular;  Laterality: Left;  . LEFT HEART CATHETERIZATION WITH CORONARY/GRAFT ANGIOGRAM N/A 03/06/2014   Procedure: LEFT HEART  CATHETERIZATION WITH Beatrix Fetters;  Surgeon: Wellington Hampshire, MD;  Location: [place] [facility];  Service: Cardiovascular;  Laterality: N/A;  . STOMACH SURGERY     removal of gastric ulcers  . TEE WITHOUT CARDIOVERSION N/A 06/01/2016   Procedure: TRANSESOPHAGEAL ECHOCARDIOGRAM (TEE);  Surgeon: Sherren Mocha, MD;  Location: [place];  Service: Open Heart Surgery;  Laterality: N/A;  . TRANSCATHETER AORTIC VALVE REPLACEMENT, TRANSFEMORAL N/A 06/01/2016   Procedure: TRANSCATHETER AORTIC VALVE REPLACEMENT, TRANSFEMORAL;  Surgeon: Sherren Mocha, MD;  Location: [facility];  Service: Open Heart Surgery;  Laterality: N/A;    Current Meds  Medication Sig  . acetaminophen (TYLENOL) 325 MG tablet Take 650 mg by mouth daily as needed for mild pain or headache.   Marland Kitchen aspirin EC 81 MG tablet Take 1 tablet (81 mg total) by mouth daily.  Marland Kitchen atorvastatin (LIPITOR) 80 MG tablet Take 1 tablet (80 mg total) by mouth daily.  . carvedilol (COREG) 3.125 MG tablet Take 1 tablet (3.125 mg total) by mouth 2 (two) times daily with a meal.  . clopidogrel (PLAVIX) 75 MG tablet Take 1 tablet (75 mg total) by mouth daily with breakfast.  . gabapentin (NEURONTIN) 100 MG capsule Take 1-3 capsules (100-300 mg total) by mouth 4 (four) times daily. Follow written titration schedule.  Marland Kitchen GARLIC PO Take by mouth daily.  . isosorbide dinitrate (ISORDIL) 10 MG tablet Take 2 tablets (20 mg total) by mouth every morning. Take 10 mg by mouth in the afternoon (Patient taking differently: Take 10 mg by mouth 3 (three) times daily. Take 10 mg by mouth in the afternoon )  . lisinopril (PRINIVIL,ZESTRIL) 10 MG tablet Take 1 tablet (10 mg total) by mouth daily.  . Multiple Vitamins-Minerals (PRESERVISION AREDS PO) Take 1 tablet by mouth daily.   . nitroGLYCERIN (NITROSTAT) 0.4 MG SL tablet Place 0.4 mg under the tongue every 5 (five) minutes as needed for chest pain.  . Omega-3 Fatty Acids (FISH OIL PO) Take by mouth daily.  . pantoprazole  (PROTONIX) 40 MG tablet Take 1 tablet (40 mg total) by mouth daily.  . tamsulosin (FLOMAX) 0.4 MG CAPS capsule Take 1 capsule (0.4 mg total) by mouth daily.  . traMADol (ULTRAM) 50 MG tablet Take 1 tablet (50 mg total) daily by mouth. (Patient taking differently: Take 50 mg by mouth daily as needed. )  . TURMERIC PO Take at bedtime by mouth.  . [DISCONTINUED] tamsulosin (FLOMAX) 0.4 MG CAPS capsule Take 1 capsule (0.4 mg total) by mouth daily.  . [DISCONTINUED] tamsulosin (FLOMAX) 0.4 MG CAPS capsule Take 1 capsule (0.4 mg total) by mouth daily.    Allergies:   Patient has no known allergies.   Social History:  The patient  reports that he quit smoking about 28 years ago. His smoking use included cigarettes. He has a 50.00 pack-year smoking history. He has  never used smokeless tobacco. He reports that he does not drink alcohol or use drugs.   Family History:  The patient's family history includes Heart disease in his father and mother.  ROS:   Review of Systems  Constitutional: Positive for malaise/fatigue. Negative for chills, diaphoresis, fever and weight loss.  HENT: Negative for congestion.   Eyes: Negative for discharge and redness.  Respiratory: Negative for cough, hemoptysis, sputum production, shortness of breath and wheezing.   Cardiovascular: Positive for chest pain. Negative for palpitations, orthopnea, claudication, leg swelling and PND.  Gastrointestinal: Negative for abdominal pain, blood in stool, heartburn, melena, nausea and vomiting.  Genitourinary: Negative for hematuria.  Musculoskeletal: Negative for falls and myalgias.  Skin: Negative for rash.  Neurological: Positive for weakness. Negative for dizziness, tingling, tremors, sensory change, speech change, focal weakness and loss of consciousness.  Endo/Heme/Allergies: Does not bruise/bleed easily.  Psychiatric/Behavioral: Negative for substance abuse. The patient is not nervous/anxious.   All other systems reviewed  and are negative.    PHYSICAL EXAM:  VS:  BP (!) 100/58 (BP Location: Left Arm, Patient Position: Sitting, Cuff Size: Normal)   Pulse (!) 59   Ht 5\' 9"  (1.753 m)   Wt 192 lb 8 oz (87.3 kg)   BMI 28.43 kg/m  BMI: Body mass index is 28.43 kg/m.  Physical Exam  Constitutional: He is oriented to person, place, and time. He appears well-developed and well-nourished.  HENT:  Head: Normocephalic and atraumatic.  Eyes: Right eye exhibits no discharge. Left eye exhibits no discharge.  Neck: Normal range of motion. No JVD present.  Cardiovascular: Normal rate, regular rhythm, S1 normal and S2 normal. Exam reveals no distant heart sounds, no friction rub, no midsystolic click and no opening snap.  Murmur heard.  Harsh midsystolic murmur is present with a grade of 1/6 at the upper right sternal border radiating to the neck. Pulses:      Posterior tibial pulses are 2+ on the right side, and 2+ on the left side.  Pulmonary/Chest: Effort normal and breath sounds normal. No respiratory distress. He has no decreased breath sounds. He has no wheezes. He has no rales. He exhibits no tenderness.  Abdominal: Soft. He exhibits no distension. There is no tenderness.  Musculoskeletal: He exhibits no edema.  Neurological: He is alert and oriented to person, place, and time.  Skin: Skin is warm and dry. No cyanosis. Nails show no clubbing.  Psychiatric: He has a normal mood and affect. His speech is normal and behavior is normal. Judgment and thought content normal.     EKG:  Was ordered and interpreted by me today. Shows sinus bradycardia, 59 bpm, LBBB (old)  Recent Labs: 04/11/2018: ALT 14 05/22/2018: BUN 18; Creatinine, Ser 1.13; Hemoglobin 12.0; Magnesium 1.9; Platelets 155; Potassium 3.8; Sodium 139; TSH 2.740  04/11/2018: Chol/HDL Ratio 3.6; Cholesterol, Total 160; HDL 44; LDL Calculated 63; Triglycerides 263   CrCl cannot be calculated (Patient's most recent lab result is older than the maximum 21  days allowed.).   Wt Readings from Last 3 Encounters:  06/21/18 192 lb 8 oz (87.3 kg)  05/22/18 190 lb (86.2 kg)  05/15/18 191 lb (86.6 kg)     Other studies reviewed: Additional studies/records reviewed today include: summarized above  ASSESSMENT AND PLAN:  1. CAD involving bypass grafts with stable angina: Currently without any symptoms concerning for angina.  Recent left heart catheterization from 05/2017 demonstrated 3 out of 4 patent grafts as detailed above.  Overall, he is  improving though still does have some limitations secondary to angina.  Blood pressure precludes escalation of carvedilol or isosorbide at this time.  We will add ranolazine 500 mg twice daily and escalate as needed.  Continue other antianginal therapy including carvedilol and isosorbide.  Aggressive risk factor modification and secondary prevention.  Continue dual antiplatelet therapy with aspirin and Plavix.  No plans for further ischemic evaluation at this time.  2. Chronic systolic CHF due to ischemic cardiomyopathy with possible contribution from frequent PVCs: He does not appear grossly volume overloaded.  Some of his worsening EF may be in the setting of ischemic cardiomyopathy though unable to rule out possible PVC burden as he was noted to have a 6% burden on recent 24-hour Holter.  Unfortunately, we are unable to escalate his carvedilol at this time given his relative hypotension with blood pressure 100/58 and a bradycardic heart rate with a pulse of 59 bpm.  After reviewing his medications with his daughter today, we have determined he has been taking both carvedilol and metoprolol.  We have discussed with both the patient and the daughter that metoprolol should no longer be taken and have placed a large X on this bottle.  Over the next several days, we should note an increase in both his heart rate and BP.  He will call on 7/15 with BP readings and if BP allows at that time we will escalate his evidence-based heart  failure therapy.  Would recommend titration of carvedilol with possible addition of spironolactone.  Could consider transition from lisinopril to [facility] after 36-hour washout if blood pressure would allow.  3. Frequent PVCs: As above.  4. Aortic stenosis status post TAVR: Stable aortic valve prosthesis by echo as above.  Continue to monitor clinically.  5. Hypertension: Blood pressure remains on the soft side.  Patient has been informed he should not be taking metoprolol or hydrochlorothiazide.  Hopefully after these medications wash out of his system we can escalate his evidence-based heart failure therapy as above.  6. Hyperlipidemia: LDL at goal with normal liver function 03/2018.  Continue atorvastatin 80 mg daily.  7. Medication management: Medications reviewed in detail with both patient and daughter.  Have placed large X's on medications the patient should no longer be taking to include metoprolol and HCTZ.  This should allow for improvement and the patient's blood pressure and heart rate to allow for escalation of evidence-based heart failure therapy.  Disposition: F/u with Dr. Fletcher Anon or an APP in 2 weeks.  Current medicines are reviewed at length with the patient today.  The patient did not have any concerns regarding medicines.  Signed, Christell Faith, PA-C 06/21/2018 3:47 PM     [address] [place] [address], [place] [address] [phone]

## 2018-06-21 NOTE — Telephone Encounter (Signed)
Patient had an appointment today with Christell Faith, PA.

## 2018-06-21 NOTE — Patient Instructions (Addendum)
Medication Instructions: - Your physician has recommended you make the following change in your medication:   1) START ranexa (ranolazine) 500 mg- take 1 tablet by mouth twice daily  - your flomax prescription has also been refilled  Labwork: - none ordered  Procedures/Testing: - none ordered  Follow-Up: - Your physician recommends that you schedule a follow-up appointment in: 2 weeks with [name], [name]- Wednesday 07/05/18 at 2:30 pm   Any Additional Special Instructions Will Be Listed Below (If Applicable).  - call the office on Monday with your blood pressure readings [phone]    If you need a refill on your cardiac medications before your next appointment, please call your pharmacy.

## 2018-06-26 ENCOUNTER — Telehealth: Payer: Self-pay | Admitting: Physician Assistant

## 2018-06-26 MED ORDER — CARVEDILOL 6.25 MG PO TABS: 6.2500 mg | ORAL_TABLET | Freq: Two times a day (BID) | ORAL | 3 refills | Status: DC

## 2018-06-26 NOTE — Telephone Encounter (Signed)
Pt daughter calling in BP readings from weekend  06/23/18  AM 112/53 PM 144/58  06/24/18  AM 149/64 PM 132/51  06/25/18 AM 119/55 PM 155/58  06/26/18  AM 176/69 HR 77

## 2018-06-26 NOTE — Telephone Encounter (Signed)
Please increase Coreg to 6.25 mg daily. Call back in one week with BP/HR. If BP remains stable would plan to add spironolactone 12/5 mg daily at that time followed by bmet 1 week after (2 weeks from today).

## 2018-06-26 NOTE — Telephone Encounter (Signed)
Patient was asked to call in with last blood pressure readings. Routing to [employer], PA-C for review.

## 2018-06-26 NOTE — Telephone Encounter (Signed)
Sorry, it should be Coreg 6.25 mg bid. Thanks for catching this.

## 2018-06-26 NOTE — Telephone Encounter (Signed)
Patient's daughter verbalized understanding of plan of care and to increase carvedilol to 6.51mb BID. She will call us back in 1 week with BP/HR readings. Rx sent to pharmacy.

## 2018-06-29 ENCOUNTER — Encounter: Payer: Self-pay | Admitting: Physician Assistant

## 2018-07-03 ENCOUNTER — Encounter: Payer: Self-pay | Admitting: Physician Assistant

## 2018-07-03 ENCOUNTER — Emergency Department
Admission: EM | Admit: 2018-07-03 | Discharge: 2018-07-03 | Disposition: A | Discharge status: Home / Self Care | Payer: Medicare HMO | Attending: Emergency Medicine | Admitting: Emergency Medicine

## 2018-07-03 ENCOUNTER — Encounter: Payer: Self-pay | Admitting: Emergency Medicine

## 2018-07-03 ENCOUNTER — Other Ambulatory Visit: Payer: Self-pay

## 2018-07-03 DIAGNOSIS — Z7982 Long term (current) use of aspirin: Secondary | ICD-10-CM | POA: Diagnosis not present

## 2018-07-03 DIAGNOSIS — D649 Anemia, unspecified: Secondary | ICD-10-CM | POA: Insufficient documentation

## 2018-07-03 DIAGNOSIS — I2510 Atherosclerotic heart disease of native coronary artery without angina pectoris: Secondary | ICD-10-CM | POA: Diagnosis not present

## 2018-07-03 DIAGNOSIS — Z7902 Long term (current) use of antithrombotics/antiplatelets: Secondary | ICD-10-CM | POA: Diagnosis not present

## 2018-07-03 DIAGNOSIS — I10 Essential (primary) hypertension: Secondary | ICD-10-CM | POA: Diagnosis not present

## 2018-07-03 DIAGNOSIS — Z79899 Other long term (current) drug therapy: Secondary | ICD-10-CM | POA: Diagnosis not present

## 2018-07-03 DIAGNOSIS — I252 Old myocardial infarction: Secondary | ICD-10-CM | POA: Diagnosis not present

## 2018-07-03 DIAGNOSIS — Z87891 Personal history of nicotine dependence: Secondary | ICD-10-CM | POA: Diagnosis not present

## 2018-07-03 LAB — BASIC METABOLIC PANEL
Anion gap: 5 (ref 5–15)
BUN: 16 mg/dL (ref 8–23)
CHLORIDE: 109 mmol/L (ref 98–111)
CO2: 26 mmol/L (ref 22–32)
Calcium: 9.4 mg/dL (ref 8.9–10.3)
Creatinine, Ser: 0.99 mg/dL (ref 0.61–1.24)
GFR calc Af Amer: >60 mL/min (ref >60)
GFR calc non Af Amer: >60 mL/min (ref >60)
Glucose, Bld: 132 mg/dL — ABNORMAL HIGH (ref 70–99)
Potassium: 3.9 mmol/L (ref 3.5–5.1)
Sodium: 140 mmol/L (ref 135–145)

## 2018-07-03 LAB — URINALYSIS, COMPLETE (UACMP) WITH MICROSCOPIC
Bacteria, UA: NONE SEEN
Bilirubin Urine: NEGATIVE
Glucose, UA: NEGATIVE mg/dL
Hgb urine dipstick: NEGATIVE
Ketones, ur: NEGATIVE mg/dL
Leukocytes, UA: NEGATIVE
Nitrite: NEGATIVE
Protein, ur: NEGATIVE mg/dL
Specific Gravity, Urine: 1.02 (ref 1.005–1.030)
pH: 6 (ref 5.0–8.0)

## 2018-07-03 LAB — CBC
HCT: 32.1 % — ABNORMAL LOW (ref 40.0–52.0)
Hemoglobin: 10.6 g/dL — ABNORMAL LOW (ref 13.0–18.0)
MCH: 27.4 pg (ref 26.0–34.0)
MCHC: 33.1 g/dL (ref 32.0–36.0)
MCV: 82.8 fL (ref 80.0–100.0)
PLATELETS: 118 10*3/uL — AB (ref 150–440)
RBC: 3.88 MIL/uL — ABNORMAL LOW (ref 4.40–5.90)
RDW: 15.7 % — ABNORMAL HIGH (ref 11.5–14.5)
WBC: 3.5 10*3/uL — ABNORMAL LOW (ref 3.8–10.6)

## 2018-07-03 MED ORDER — FERROUS GLUCONATE 240 (27 FE) MG PO TABS: 240.0000 mg | ORAL_TABLET | Freq: Every day | ORAL | 0 refills | Status: DC

## 2018-07-03 NOTE — ED Triage Notes (Signed)
Sent from pcp for abnormal labs--low blood count.  Had tests done on Friday.  Only sx is he feels swimmyheaded.

## 2018-07-03 NOTE — Discharge Instructions (Addendum)
Please seek medical attention for any high fevers, chest pain, shortness of breath, change in behavior, persistent vomiting, bloody stool or any other new or concerning symptoms.  

## 2018-07-03 NOTE — ED Provider Notes (Signed)
[facility] Emergency Department Provider Note   ____________________________________________   I have reviewed the triage vital signs and the nursing notes.   HISTORY  Chief Complaint Dizziness and Abnormal Lab   History limited by: Not Limited   HPI Matthew [name] is a 82 y.o. male who presents to the emergency department today at the request of PCP because of concern for anemia.  The patient's hemoglobin was 10.5 early last week and then came down to 9.8 later in the week.  The patient states that he had both his urine and stool checked for blood and both were negative.  Patient has been having some increased weakness over the past few weeks.  The patient has not had any fevers.  No chest pain.   Per medical record review patient has a history of baseline hemoglobin around 12  Past Medical History:  Diagnosis Date  . AS (aortic stenosis)    a. Echo 6/10: EF 55% mild AS; b. echo 06/2015; EF 55-60%, GR1DD, moderate AS, Peak velocity (S): 346 cm/s. Mean gradientS): 28 mm Hg. Peak gradient (S): 48 mm Hg. Valve area (VTI): 1.18 cm2;   c. Echo 4/17 - mild LVH, EF 55-60%, no RWMA, Gr 1 DD, mod to severe AS (mean 28 mmHg, peak 46 mmHg), mild LAE  . CAD (coronary artery disease)    a. MI 1996 w/ CABG 1996; b.redo CABG 06/2003; c. Myoview 7/09: EF 54% inferobasal infarct, no ischemia. Myoview 6/10 EF 43% inf wall infarct. no ischemia; c. cath 8/16 s/p DES to VG-OM2. OTW 3VD w/ patent VG->RPDA, VG->OM3,  & LIMA->LAD.  EF 55-65%.  . Carotid bruit    2009 0-39% on dopplers bilatrally  . GERD (gastroesophageal reflux disease)   . History of kidney stones   . HOH (hard of hearing)   . HTN (hypertension)   . Hyperlipidemia   . Myocardial infarction ([place]) 1996  . Nephrolithiasis   . OSA (obstructive sleep apnea)   . PAD (peripheral artery disease) ([place]) 02/2014   Subtotal occlusion of right common iliac artery and 70% stenosis in the left common iliac artery. Status post  bilateral kissing stent placement. 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 Shortness of breath    with exertion  . Thrombocytopenia [facility])     Patient Active Problem List   Diagnosis Date Noted  . Lumbar spinal stenosis 12/26/2017  . Abnormal MRI, lumbar spine 12/26/2017  . Chronic lumbar radiculopathy 12/26/2017  . Chronic lower extremity radicular pain (S1 dermatome) (Bilateral) (L>R) 11/15/2017  . Long term current use of anticoagulant (Plavix) 09/28/2017  . History of MI (myocardial infarction) (Last 2004) 09/28/2017  . History of alcoholism ([place]) 09/28/2017  . Neurogenic pain 09/28/2017  . Lumbar facet syndrome (Bilateral) (L>R) 09/28/2017  . DDD (degenerative disc disease), lumbar 09/28/2017  . Lumbar facet osteoarthritis 09/28/2017  . Lumbar spondylosis with radiculopathy 09/28/2017  . Gastroesophageal reflux disease without esophagitis 09/28/2017  . Chronic hip pain 09/01/2017  . Long term current use of opiate analgesic 08/31/2017  . Long term prescription opiate use 08/31/2017  . Opiate use 08/31/2017  . Chronic low back pain (Primary Area of Pain) (Bilateral) (L>R) 08/31/2017  . Chronic lower extremity pain (Bilateral) (L>R) 08/31/2017  . Chronic pain syndrome 08/31/2017  . Chronic sacroiliac joint pain (Bilateral) (L>R) 08/31/2017  . Severe aortic stenosis  05/25/2016  . Thrombocytopenia ([place])   . Coronary artery disease 08/01/2015  . Effort angina ([place]) 07/31/2015  . PAD (peripheral artery disease) ([place]) 03/22/2014  . Angina pectoris ([place]) 02/14/2014  . Aortic valve Replacement 05/01/2009  . Obstructive sleep apnea 03/05/2008  . HLD (hyperlipidemia) 03/04/2008  . Essential hypertension 03/04/2008  . Coronary atherosclerosis 03/04/2008    Past Surgical History:  Procedure Laterality Date  .  ABDOMINAL AORTAGRAM N/A 03/06/2014   Procedure: ABDOMINAL Maxcine Ham;  Surgeon: Wellington Hampshire, MD;  Location: [place] CATH LAB;  Service: Cardiovascular;  Laterality: N/A;  . CARDIAC CATHETERIZATION  02/2014   Severe three-vessel coronary artery disease with patent grafts.   Marland Kitchen CARDIAC CATHETERIZATION N/A 07/31/2015   Procedure: Right/Left Heart Cath and Coronary/Graft Angiography;  Surgeon: Wellington Hampshire, MD;  Location: [place] CV LAB;  Service: Cardiovascular;  Laterality: N/A;  . CARDIAC CATHETERIZATION N/A 07/31/2015   Procedure: Coronary Stent Intervention;  Surgeon: Wellington Hampshire, MD;  Location: [place] CV LAB;  Service: Cardiovascular;  Laterality: N/A;  . CARDIAC CATHETERIZATION N/A 05/26/2016   Procedure: Right/Left Heart Cath and Coronary Angiography;  Surgeon: Wellington Hampshire, MD;  Location: [place] CV LAB;  Service: Cardiovascular;  Laterality: N/A;  . CORONARY ARTERY BYPASS GRAFT  1991   at [facility]. Redone 2004-3 vessels 1st time and 4 second time  . CYSTO     2/3 times for kidney stones  . INGUINAL HERNIA REPAIR  11/10/2011   Procedure: HERNIA REPAIR INGUINAL ADULT;  Surgeon: Donato Heinz;  Location: [facility];  Service: General;  Laterality: Right;  . LEFT HEART CATH AND CORONARY ANGIOGRAPHY Left 05/15/2018   Procedure: LEFT HEART CATH AND CORONARY ANGIOGRAPHY;  Surgeon: Wellington Hampshire, MD;  Location: [place] CV LAB;  Service: Cardiovascular;  Laterality: Left;  . LEFT HEART CATHETERIZATION WITH CORONARY/GRAFT ANGIOGRAM N/A 03/06/2014   Procedure: LEFT HEART CATHETERIZATION WITH Beatrix Fetters;  Surgeon: Wellington Hampshire, MD;  Location: [place] CATH LAB;  Service: Cardiovascular;  Laterality: N/A;  . STOMACH SURGERY     removal of gastric ulcers  . TEE WITHOUT CARDIOVERSION N/A 06/01/2016   Procedure: TRANSESOPHAGEAL ECHOCARDIOGRAM (TEE);  Surgeon: Sherren Mocha, MD;  Location: [place];  Service: Open Heart Surgery;  Laterality: N/A;  . TRANSCATHETER AORTIC  VALVE REPLACEMENT, TRANSFEMORAL N/A 06/01/2016   Procedure: TRANSCATHETER AORTIC VALVE REPLACEMENT, TRANSFEMORAL;  Surgeon: Sherren Mocha, MD;  Location: [place];  Service: Open Heart Surgery;  Laterality: N/A;    Prior to Admission medications   Medication Sig Start Date End Date Taking? Authorizing Provider  acetaminophen (TYLENOL) 325 MG tablet Take 650 mg by mouth daily as needed for mild pain or headache.     [provider]  aspirin EC 81 MG tablet Take 1 tablet (81 mg total) by mouth daily. 03/22/14   Wellington Hampshire, MD  atorvastatin (LIPITOR) 80 MG tablet Take 1 tablet (80 mg total) by mouth daily. 05/23/18   Wellington Hampshire, MD  carvedilol (COREG) 6.25 MG tablet Take 1 tablet (6.25 mg total) by mouth 2 (two) times daily. 06/26/18   Rise Mu, PA-C  clopidogrel (PLAVIX) 75 MG tablet Take 1 tablet (75 mg total) by mouth daily with breakfast. 05/23/18   Wellington Hampshire, MD  gabapentin (NEURONTIN) 100 MG capsule Take 1-3 capsules (100-300 mg total) by mouth 4 (four) times daily. Follow written titration schedule. 05/15/18 08/13/18  Vevelyn Francois, NP  GARLIC PO Take by mouth daily.    [provider]  isosorbide dinitrate (ISORDIL) 10 MG tablet Take 2 tablets (20 mg total) by mouth every morning. Take 10 mg by mouth in the afternoon Patient taking differently: Take 10 mg by mouth 3 (three) times daily. Take 10 mg by mouth in the afternoon  04/11/18   Rise Mu, PA-C  lisinopril (PRINIVIL,ZESTRIL) 10 MG tablet Take 1 tablet (10 mg total) by mouth daily. 05/12/18 08/10/18  Rise Mu, PA-C  Multiple Vitamins-Minerals (PRESERVISION AREDS PO) Take 1 tablet by mouth daily.     [provider]  nitroGLYCERIN (NITROSTAT) 0.4 MG SL tablet Place 0.4 mg under the tongue every 5 (five) minutes as needed for chest pain.    [provider]  Omega-3 Fatty Acids (FISH OIL PO) Take by mouth daily.    [provider]  pantoprazole (PROTONIX) 40 MG tablet Take 1  tablet (40 mg total) by mouth daily. 05/31/18   Dunn, Areta Haber, PA-C  ranolazine (RANEXA) 500 MG 12 hr tablet Take 1 tablet (500 mg total) by mouth 2 (two) times daily. 06/21/18   Dunn, Areta Haber, PA-C  tamsulosin (FLOMAX) 0.4 MG CAPS capsule Take 1 capsule (0.4 mg total) by mouth daily. 06/21/18   Dunn, Areta Haber, PA-C  traMADol (ULTRAM) 50 MG tablet Take 1 tablet (50 mg total) daily by mouth. Patient taking differently: Take 50 mg by mouth daily as needed.  12/12/17 06/21/18  Milinda Pointer, MD  TURMERIC PO Take at bedtime by mouth.    [provider]    Allergies Patient has no known allergies.  Family History  Problem Relation Age of Onset  . Heart disease Father   . Heart disease Mother     Social History Social History   Tobacco Use  . Smoking status: Former Smoker    Packs/day: 1.00    Years: 50.00    Pack years: 50.00    Types: Cigarettes    Last attempt to quit: 01/24/1990    Years since quitting: 28.4  . Smokeless tobacco: Never Used  Substance Use Topics  . Alcohol use: No    Alcohol/week: 0.0 oz    Comment: Alcoholic  . Drug use: No    Review of Systems Constitutional: No fever/chills Eyes: No visual changes. ENT: No sore throat. Cardiovascular: Denies chest pain. Respiratory: Denies shortness of breath. Gastrointestinal: No abdominal pain.  No nausea, no vomiting.  No diarrhea.   Genitourinary: Negative for dysuria. Musculoskeletal: Negative for back pain. Skin: Negative for rash. Neurological: Dizziness ____________________________________________   PHYSICAL EXAM:  VITAL SIGNS: ED Triage Vitals  Enc Vitals Group     BP 07/03/18 1148 (!) 135/50     Pulse Rate 07/03/18 1148 (!) 56     Resp 07/03/18 1148 16     Temp 07/03/18 1148 97.8 F (36.6 C)     Temp Source 07/03/18 1148 Oral     SpO2 07/03/18 1148 96 %     Weight 07/03/18 1149 192 lb (87.1 kg)     Height 07/03/18 1149 5\' 9"  (1.753 m)     Head Circumference --      Peak Flow --       Pain Score 07/03/18 1148 4   Constitutional: Alert and oriented.  Eyes: Conjunctivae are normal.  ENT      Head: Normocephalic and atraumatic.      Nose: No congestion/rhinnorhea.      Mouth/Throat: Mucous membranes are moist.      Neck: No stridor. Hematological/Lymphatic/Immunilogical: No cervical  lymphadenopathy. Cardiovascular: Normal rate, regular rhythm.  Positive systolic murmur Respiratory: Normal respiratory effort without tachypnea nor retractions. Breath sounds are clear and equal bilaterally. No wheezes/rales/rhonchi. Gastrointestinal: Soft and non tender. No rebound. No guarding.  Genitourinary: Deferred Musculoskeletal: Normal range of motion in all extremities. No lower extremity edema. Neurologic:  Normal speech and language. No gross focal neurologic deficits are appreciated.  Skin:  Skin is warm, dry and intact. No rash noted. Psychiatric: Mood and affect are normal. Speech and behavior are normal. Patient exhibits appropriate insight and judgment.  ____________________________________________    LABS (pertinent positives/negatives)  CBC wbc 3.5, hgb 10.6, plt 118 UA not consistent with infection  ____________________________________________   EKG  I, Nance Pear, attending physician, personally viewed and interpreted this EKG  EKG Time: 1205 Rate: 59 Rhythm: sinus bradycardia Axis: left axis deviation Intervals: qtc 449 QRS: LBBB ST changes: no st elevation Impression: abnormal ekg  ____________________________________________    RADIOLOGY  None  ____________________________________________   PROCEDURES  Procedures  ____________________________________________   INITIAL IMPRESSION / ASSESSMENT AND PLAN / ED COURSE  Pertinent labs & imaging results that were available during my care of the patient were reviewed by me and considered in my medical decision making (see chart for details).   Patient presented to the emergency  department today because of concerns for continued fall in hemoglobin.  Hemoglobin today was back to what it was roughly 1 week ago.  Patient had his stool rechecked it was negative.  This point I did discuss possibility of starting iron supplementation.  Will plan on starting patient on iron pills and will have patient follow-up with primary care.  ____________________________________________   FINAL CLINICAL IMPRESSION(S) / ED DIAGNOSES  Final diagnoses:  Anemia, unspecified type     Note: This dictation was prepared with Dragon dictation. Any transcriptional errors that result from this process are unintentional     Nance Pear, MD 07/04/18 [phone]

## 2018-07-03 NOTE — ED Notes (Signed)
First Nurse Note:  Patient was sent to the ED by his doctor.  Patient had labs drawn on Monday and on Friday and his hemoglobin went from 10.5 to 9.8.  Patient reports feeling "swimmy headed" at this time."

## 2018-07-05 ENCOUNTER — Ambulatory Visit: Payer: Medicare HMO | Admitting: Physician Assistant

## 2018-07-05 ENCOUNTER — Encounter: Payer: Self-pay | Admitting: Physician Assistant

## 2018-07-05 VITALS — BP 100/62 | HR 53 | Ht 69.0 in | Wt 192.8 lb

## 2018-07-05 DIAGNOSIS — I2510 Atherosclerotic heart disease of native coronary artery without angina pectoris: Secondary | ICD-10-CM | POA: Diagnosis not present

## 2018-07-05 DIAGNOSIS — I493 Ventricular premature depolarization: Secondary | ICD-10-CM

## 2018-07-05 DIAGNOSIS — E785 Hyperlipidemia, unspecified: Secondary | ICD-10-CM

## 2018-07-05 DIAGNOSIS — I5022 Chronic systolic (congestive) heart failure: Secondary | ICD-10-CM | POA: Diagnosis not present

## 2018-07-05 DIAGNOSIS — D61818 Other pancytopenia: Secondary | ICD-10-CM

## 2018-07-05 DIAGNOSIS — I10 Essential (primary) hypertension: Secondary | ICD-10-CM

## 2018-07-05 DIAGNOSIS — I359 Nonrheumatic aortic valve disorder, unspecified: Secondary | ICD-10-CM

## 2018-07-05 MED ORDER — CARVEDILOL 3.125 MG PO TABS: 3.1250 mg | ORAL_TABLET | Freq: Two times a day (BID) | ORAL | 1 refills | Status: DC

## 2018-07-05 NOTE — Progress Notes (Signed)
Cardiology Office Note Date:  07/05/2018  Patient ID:  Matthew [name], Matthew [name] 25-Oct-1936, MRN [id number] PCP:  Antionette Char, MD  Cardiologist:  Dr. Fletcher Anon, MD    Chief Complaint: Follow up  History of Present Illness: Matthew [name] is a 82 y.o. male with history of CAD s/p CABG in 1991 with redo bypass in 2004, aortic stenosis s/p TAVR in 08/9241,ASTMHDQ combined systolic and diastolic CHF, stable angina, PAD with bilateral common iliac artery kissing stenting in 2015, HTN, anemia, OSA, and GERD who presents forfollow-up of his CAD and CHF.   R/LHC in 05/2016, prior to his TAVR, showed severe underlying 3-vessel CAD with patent grafts (proximal LAD 100% stenosed, mid LCx 100% stenosed, OM2 80% stenosed, proximal RCA 100% stenosed, LIMA-LAD was unable to be cannulated and was anatomically normal and normal in caliber, RIMA-D2 was visualized by non-selective angiography and was anatomically normal, VG-OM2 with a patent, previously placed proximal graft stent, 50% mid VG-OM2 stenosis, VG-OM3 minimal luminal irregularities, VG-RPDA mild diffuse disease), RHC showed high normal pulmonary pressure and filling pressure, normal cardiac output. Continued medical management was advised. At his follow up with Dr. Fletcher Anon in 05/2017 he was doing well, though continued to note intermittent episodes of substernal chest tightness when he was late with taking his medications. TTE from 05/2017 showed an EF of 45-50%, mild concentric LVH, Gr1DD, septal dyssnergy, TAVR prosthesis was noted and functioning normally, moderately to severely dilated left atrium, mildly dilated right atrium. When compared to prior TTE from 06/2016 his EF had dropped from 55% and the LV was more dilated.He was seen in the office on 04/11/2018 for routine follow-up with a weight of 192 pounds, (which was stable when compared to his weight from 05/13/2017).Hecontinuedto note intermittent chest pains if hewas late taking his medications.There was  also some exertional chest pain. He underwent Lexiscan Myoview on 04/14/2018 that showed a medium defect of moderate severity present in the basal inferior septal location with findings consistent with prior MI, EF 36%, left bundle branch block was noted, this was an intermediate risk study. Follow-up echocardiogram on 05/03/2018 showed further reduction of his EF to 30 to 35%, diffuse hypokinesis, grade 2 diastolic dysfunction, bioprosthetic aortic valve was noted with a mean gradient of 14 mmHg, mildly dilated aortic root, mild MR, moderately dilated left atrium, RV systolic function was normal, PASP was normal. Case was discussed with his primary cardiologist who recommended optimization of heart failure medications. Patient was transitioned from metoprolol to carvedilol. In follow up on 5/31, he continued to note a functional decline with associated exertional chest pain. BP precluded further escalation of medications. Patient's family requested LHC. He underwent LHC on 05/15/2018 that showed significant underlying 3-vessel CAD with a patent LIMA-LAD, patent RIMA-diagonal, patent SVG-OM3 and patent SVG-rPDA. The SVG-OM2 was noted to be occluded which was a new finding; however the OM2 was receiving retrograde flow from the SVG-OM3. There was also noted to be possible significant left subclavian artery stenosis, though no gradient was noted with pullback. He was noted to have frequent PVCs before and during the cath. Carotid artery ultrasound on 05/26/18, performed for the noted possibly left subclavian artery stenosis above, showed 1-39% bilateral ICA stenosis with normal flow dynamics in the bilateral subclavian arteries. Holter monitor from 05/2018 showed 5,600 PVCs representing 6% burden with occasional PACs and an average heart rate of 62 bpm. His Coreg was increased to 6.25 mg bid along with holding of his HCTZ.   Patient was most  recently seen in the office on 06/21/2018 and was doing reasonably well from a  cardiac perspective.  It was noted at that time he had continue to take both carvedilol and metoprolol despite [place] discontinuing metoprolol earlier in 2019.  He had also been taking Isordil 3 times daily rather than twice daily.  Following discontinuation of metoprolol his blood pressure improved into the 1 teens to 778E systolic.  This allowed for escalation of carvedilol to 6.25 mg twice daily on 7/15.  Labs checked by patient's PCP on 7/15 showed a slight decrease in his hemoglobin to 10.5 with a baseline of approximately 11-12.  Patient's daughter was concerned with this decrease and has since taken the patient to follow-up with his PCP.  Patient follow-up with his PCP on 7/19 with noted continued drop in his hemoglobin to 9.8.  Patient was sent to [facility] ED on 7/22 because of this.  Per ED note, patient's stool and urine were both checked for blood with both results being negative.  Vitals in the ED showed a BP of 135/50, heart rate 56 bpm.  Labs showed a hemoglobin of 10.6, WBC 3.5, platelet count 118.  Sodium 140, potassium 3.9, serum creatinine 0.99, glucose 132, UA negative for hemoglobin.  EKG showed sinus bradycardia, 59 bpm, LBBB (old).  Patient was advised to start iron pills and follow-up with PCP.  He comes in today accompanied by his daughter. He has not had any chest pain since we added Ranexa at his last visit. His SOB is also improved. He does note continued, possibly worse, fatigue and tiredness. He has been referred to GI for his down trending HGB by his PCP. He also is in the process of obtaining a new CPAP mask. Weight has remained stable. No orthopnea, PND, lower extremity swelling, or early satiety. BP readings from home have ranged from the 423N to 361 systolic. BP cuff at home is ~ 9-66 years old. They are uncertain how accurate it is. They do not have the cuff with them today for comparison.   Past Medical History:  Diagnosis Date  . AS (aortic stenosis)    a. Echo 6/10: EF 55%  mild AS; b. echo 06/2015; EF 55-60%, GR1DD, moderate AS, Peak velocity (S): 346 cm/s. Mean gradientS): 28 mm Hg. Peak gradient (S): 48 mm Hg. Valve area (VTI): 1.18 cm2;   c. Echo 4/17 - mild LVH, EF 55-60%, no RWMA, Gr 1 DD, mod to severe AS (mean 28 mmHg, peak 46 mmHg), mild LAE  . CAD (coronary artery disease)    a. MI 1996 w/ CABG 1996; b.redo CABG 06/2003; c. Myoview 7/09: EF 54% inferobasal infarct, no ischemia. Myoview 6/10 EF 43% inf wall infarct. no ischemia; c. cath 8/16 s/p DES to VG-OM2. OTW 3VD w/ patent VG->RPDA, VG->OM3,  & LIMA->LAD.  EF 55-65%.  . Carotid bruit    2009 0-39% on dopplers bilatrally  . GERD (gastroesophageal reflux disease)   . History of kidney stones   . HOH (hard of hearing)   . HTN (hypertension)   . Hyperlipidemia   . Myocardial infarction ([place]) 1996  . Nephrolithiasis   . OSA (obstructive sleep apnea)   . PAD (peripheral artery disease) ([place]) 02/2014   Subtotal occlusion of right common iliac artery and 70% stenosis in the left common iliac artery. Status post bilateral kissing stent placement. Significant post stenosis aneurysmal dilatation on the right side (any future catheterization through the right femoral artery should be done cautiously to avoid  advancing the wire behind the stent struts)  . Shortness of breath    with exertion  . Thrombocytopenia ([place])     Past Surgical History:  Procedure Laterality Date  . ABDOMINAL AORTAGRAM N/A 03/06/2014   Procedure: ABDOMINAL Maxcine Ham;  Surgeon: Wellington Hampshire, MD;  Location: [place] CATH LAB;  Service: Cardiovascular;  Laterality: N/A;  . CARDIAC CATHETERIZATION  02/2014   Severe three-vessel coronary artery disease with patent grafts.   Marland Kitchen CARDIAC CATHETERIZATION N/A 07/31/2015   Procedure: Right/Left Heart Cath and Coronary/Graft Angiography;  Surgeon: Wellington Hampshire, MD;  Location: [place] CV LAB;  Service: Cardiovascular;  Laterality: N/A;  . CARDIAC CATHETERIZATION N/A 07/31/2015   Procedure:  Coronary Stent Intervention;  Surgeon: Wellington Hampshire, MD;  Location: [place] CV LAB;  Service: Cardiovascular;  Laterality: N/A;  . CARDIAC CATHETERIZATION N/A 05/26/2016   Procedure: Right/Left Heart Cath and Coronary Angiography;  Surgeon: Wellington Hampshire, MD;  Location: [place] CV LAB;  Service: Cardiovascular;  Laterality: N/A;  . CORONARY ARTERY BYPASS GRAFT  1991   at [facility]. Redone 2004-3 vessels 1st time and 4 second time  . CYSTO     2/3 times for kidney stones  . INGUINAL HERNIA REPAIR  11/10/2011   Procedure: HERNIA REPAIR INGUINAL ADULT;  Surgeon: Donato Heinz;  Location: [facility];  Service: General;  Laterality: Right;  . LEFT HEART CATH AND CORONARY ANGIOGRAPHY Left 05/15/2018   Procedure: LEFT HEART CATH AND CORONARY ANGIOGRAPHY;  Surgeon: Wellington Hampshire, MD;  Location: [place] CV LAB;  Service: Cardiovascular;  Laterality: Left;  . LEFT HEART CATHETERIZATION WITH CORONARY/GRAFT ANGIOGRAM N/A 03/06/2014   Procedure: LEFT HEART CATHETERIZATION WITH Beatrix Fetters;  Surgeon: Wellington Hampshire, MD;  Location: [place] CATH LAB;  Service: Cardiovascular;  Laterality: N/A;  . STOMACH SURGERY     removal of gastric ulcers  . TEE WITHOUT CARDIOVERSION N/A 06/01/2016   Procedure: TRANSESOPHAGEAL ECHOCARDIOGRAM (TEE);  Surgeon: Sherren Mocha, MD;  Location: [place];  Service: Open Heart Surgery;  Laterality: N/A;  . TRANSCATHETER AORTIC VALVE REPLACEMENT, TRANSFEMORAL N/A 06/01/2016   Procedure: TRANSCATHETER AORTIC VALVE REPLACEMENT, TRANSFEMORAL;  Surgeon: Sherren Mocha, MD;  Location: [place];  Service: Open Heart Surgery;  Laterality: N/A;    Current Meds  Medication Sig  . acetaminophen (TYLENOL) 325 MG tablet Take 650 mg by mouth daily as needed for mild pain or headache.   Marland Kitchen aspirin EC 81 MG tablet Take 1 tablet (81 mg total) by mouth daily.  Marland Kitchen atorvastatin (LIPITOR) 80 MG tablet Take 1 tablet (80 mg total) by mouth daily.  . carvedilol (COREG) 6.25 MG tablet  Take 1 tablet (6.25 mg total) by mouth 2 (two) times daily.  . clopidogrel (PLAVIX) 75 MG tablet Take 1 tablet (75 mg total) by mouth daily with breakfast.  . ferrous gluconate (IRON 27) 240 (27 FE) MG tablet Take 1 tablet (240 mg total) by mouth daily.  . ferrous sulfate 325 (65 FE) MG tablet Take 325 mg by mouth. Takes 3-4 times daily.  Marland Kitchen gabapentin (NEURONTIN) 100 MG capsule Take 1-3 capsules (100-300 mg total) by mouth 4 (four) times daily. Follow written titration schedule.  Marland Kitchen GARLIC PO Take by mouth daily.  . isosorbide dinitrate (ISORDIL) 10 MG tablet Take 2 tablets (20 mg total) by mouth every morning. Take 10 mg by mouth in the afternoon (Patient taking differently: Take 10 mg by mouth 3 (three) times daily. Take 10 mg by mouth in the afternoon )  .  lisinopril (PRINIVIL,ZESTRIL) 10 MG tablet Take 1 tablet (10 mg total) by mouth daily.  . Multiple Vitamins-Minerals (PRESERVISION AREDS PO) Take 1 tablet by mouth daily.   . nitroGLYCERIN (NITROSTAT) 0.4 MG SL tablet Place 0.4 mg under the tongue every 5 (five) minutes as needed for chest pain.  . Omega-3 Fatty Acids (FISH OIL PO) Take by mouth daily.  . pantoprazole (PROTONIX) 40 MG tablet Take 1 tablet (40 mg total) by mouth daily.  . ranolazine (RANEXA) 500 MG 12 hr tablet Take 1 tablet (500 mg total) by mouth 2 (two) times daily.  . tamsulosin (FLOMAX) 0.4 MG CAPS capsule Take 1 capsule (0.4 mg total) by mouth daily.  . TURMERIC PO Take at bedtime by mouth.    Allergies:   Patient has no known allergies.   Social History:  The patient  reports that he quit smoking about 28 years ago. His smoking use included cigarettes. He has a 50.00 pack-year smoking history. He has never used smokeless tobacco. He reports that he does not drink alcohol or use drugs.   Family History:  The patient's family history includes Heart disease in his father and mother.  ROS:   Review of Systems  Constitutional: Positive for malaise/fatigue. Negative for  chills, diaphoresis, fever and weight loss.  HENT: Negative for congestion.   Eyes: Negative for discharge and redness.  Respiratory: Negative for cough, hemoptysis, sputum production, shortness of breath and wheezing.   Cardiovascular: Negative for chest pain, palpitations, orthopnea, claudication, leg swelling and PND.  Gastrointestinal: Positive for constipation. Negative for abdominal pain, blood in stool, diarrhea, heartburn, melena, nausea and vomiting.  Genitourinary: Negative for hematuria.  Musculoskeletal: Negative for falls and myalgias.  Skin: Negative for rash.  Neurological: Positive for weakness. Negative for dizziness, tingling, tremors, sensory change, speech change, focal weakness and loss of consciousness.  Endo/Heme/Allergies: Does not bruise/bleed easily.  Psychiatric/Behavioral: Negative for substance abuse. The patient is not nervous/anxious.   All other systems reviewed and are negative.    PHYSICAL EXAM:  VS:  BP 100/62 (BP Location: Left Arm, Patient Position: Sitting, Cuff Size: Normal)   Pulse (!) 53   Ht 5\' 9"  (1.753 m)   Wt 192 lb 12 oz (87.4 kg)   BMI 28.46 kg/m  BMI: Body mass index is 28.46 kg/m.  Physical Exam  Constitutional: He is oriented to person, place, and time. He appears well-developed and well-nourished.  HENT:  Head: Normocephalic and atraumatic.  Eyes: Right eye exhibits no discharge. Left eye exhibits no discharge.  Neck: Normal range of motion. No JVD present.  Cardiovascular: Normal rate, regular rhythm, S1 normal and S2 normal. Exam reveals no distant heart sounds, no friction rub, no midsystolic click and no opening snap.  Murmur heard.  Harsh midsystolic murmur is present with a grade of 1/6 at the upper right sternal border radiating to the neck. Pulses:      Posterior tibial pulses are 2+ on the right side, and 2+ on the left side.  Pulmonary/Chest: Effort normal and breath sounds normal. No respiratory distress. He has no  decreased breath sounds. He has no wheezes. He has no rales. He exhibits no tenderness.  Abdominal: Soft. He exhibits no distension. There is no tenderness.  Musculoskeletal: He exhibits no edema.  Neurological: He is alert and oriented to person, place, and time.  Skin: Skin is warm and dry. No cyanosis. Nails show no clubbing.  Psychiatric: He has a normal mood and affect. His speech is normal and  behavior is normal. Judgment and thought content normal.     EKG:  Was ordered and interpreted by me today. Shows sinus bradycardia, 53 bpm, left axis deviation, LBBB (old)  Recent Labs: 04/11/2018: ALT 14 05/22/2018: Magnesium 1.9; TSH 2.740 07/03/2018: BUN 16; Creatinine, Ser 0.99; Hemoglobin 10.6; Platelets 118; Potassium 3.9; Sodium 140  04/11/2018: Chol/HDL Ratio 3.6; Cholesterol, Total 160; HDL 44; LDL Calculated 63; Triglycerides 263   Estimated Creatinine Clearance: 66.2 mL/min (by C-G formula based on SCr of 0.99 mg/dL).   Wt Readings from Last 3 Encounters:  07/05/18 192 lb 12 oz (87.4 kg)  07/03/18 192 lb (87.1 kg)  06/21/18 192 lb 8 oz (87.3 kg)     Other studies reviewed: Additional studies/records reviewed today include: summarized above  ASSESSMENT AND PLAN:  1. CAD involving bypass grafts without angina: No symptoms concerning for angina at this time. Recent LHC as above demonstrating 3/4 patent grafts. With medical management being advised. No chest pain since he was last seen. Continue DAPT with ASA and Plavix. If his HGB continues to down trend we may need to consider holding Plavix (no recent PCI). Continue Coreg, isosorbide, and Ranexa. Aggressive secondary prevention. No plans for further ischemic evaluation at this time.   2. Chronic systolic CHF due to ICM with possible contribution from PVCs: He does not appear grossly volume up at this time. We have decreased his Coreg back down to 3.125 mg bid given his hypotension, bradycardia, and fatigue. Doubt he will be able to  tolerate further escalation of Coreg, much less addition of Entresto in place of his ACEi (following potential washout). Could consider spironolactone in follow up if BP allows. He is not noted to have any PVCs on 12-lead EKG today on higher dose Coreg. With the decrease of his dose back down to 3.125 mg bid, he may re-develop increased PVCs. If that is the case, and we are unable to escalate his beta blocker any further due to hypotension, we may need to consider AAT.   3. Pancytopenia: Noted on CBC drawn in the ED. Trend today. If remains pancytopenic recommend referral to hematology. He has been referred to GI for his anemia by PCP.   4. HTN: BP remains on the soft side at 100/62 on increased dose of Coreg. We have decreased his Coreg back to 3.125 mg bid given his soft BP, bradycardia, and fatigue. Continue lisinopril 10 mg daily for now. Doubt he is going to be able to tolerate transition from ACEi to [facility] given his soft BP.  5. HLD: Lipitor 80 mg daily. LDL at goal with recent check in 03/2018.   6. Aortic stenosis s/p TAVR: Stable aortic valve prosthesis by echo as above. Continue to monitor clinically.   7. Frequent PVCs: None noted on 12 lead EKG today. Coreg as above.   Disposition: F/u with Dr. Fletcher Anon or an APP in 4 weeks.   Current medicines are reviewed at length with the patient today.  The patient did not have any concerns regarding medicines.  Signed, Christell Faith, PA-C 07/05/2018 2:34 PM     [address] [place], [place] [address] [phone]

## 2018-07-05 NOTE — Patient Instructions (Signed)
Medication Instructions: DECREASE the Carvedilol to 3.125 mg twice daily  If you need a refill on your cardiac medications before your next appointment, please call your pharmacy.   Labwork: Your provider would like for you to have the following labs today: CBC   Follow-Up: Your physician wants you to follow-up in one month with Dr. Fletcher Anon or Christell Faith, PA  Thank you for choosing [facility] at [facility]!

## 2018-07-06 LAB — CBC
Hematocrit: 34 % — ABNORMAL LOW (ref 37.5–51.0)
Hemoglobin: 10.7 g/dL — ABNORMAL LOW (ref 13.0–17.7)
MCH: 26.2 pg — ABNORMAL LOW (ref 26.6–33.0)
MCHC: 31.5 g/dL (ref 31.5–35.7)
MCV: 83 fL (ref 79–97)
Platelets: 139 10*3/uL — ABNORMAL LOW (ref 150–450)
RBC: 4.08 x10E6/uL — ABNORMAL LOW (ref 4.14–5.80)
RDW: 15.1 % (ref 12.3–15.4)
WBC: 5.4 10*3/uL (ref 3.4–10.8)

## 2018-07-11 ENCOUNTER — Encounter: Payer: Self-pay | Admitting: Physician Assistant

## 2018-07-11 ENCOUNTER — Encounter: Payer: Self-pay | Admitting: Internal Medicine

## 2018-07-19 ENCOUNTER — Other Ambulatory Visit: Payer: Self-pay | Admitting: Physician Assistant

## 2018-08-02 ENCOUNTER — Telehealth: Payer: Self-pay | Admitting: *Deleted

## 2018-08-02 NOTE — Telephone Encounter (Signed)
Call placed to the patient's daughter, per the dpr. She stated that the patient is having lower extremity edema with one more so than the other.   She stated that the patient had a busy day on Sunday with a lot of walking around and since then he has been having the edema.  He has been elevating his legs when he is at home and this does help with the edema. The patient denies shortness of breath and weight gain. The daughter did state that the swelling gets better overnight but then progresses over they day.   She has been advised to tell her father to keep elevating his legs as much as possible during that day and to watch his sodium intake.

## 2018-08-03 NOTE — Telephone Encounter (Signed)
With stable weight and w/o other symptoms, I agree - sounds more like venous insufficiency/dependent edema and he should try and keep his legs elevated when not walking/standing and also closely monitor his salt intake (? Processed foods/restaraunt foods).  He could consider wearing compression stockings while up and about to prevent swelling, though summer heat may be prohibitive.  If he develops wt gain, worsening edema, or dyspnea, he will need to be seen sooner than 9/9 - his next appt w/ Ryan.

## 2018-08-03 NOTE — Telephone Encounter (Signed)
Daughter verbalized understanding of recommendations and will call us if patient has any new or worsening symptoms.

## 2018-08-18 NOTE — Progress Notes (Signed)
Cardiology Office Note Date:  08/21/2018  Patient ID:  [name], [name] 13-Mar-1936, MRN [id number] PCP:  Barry Dienes, NP  Cardiologist:  Dr. Fletcher Anon, MD    Chief Complaint: Follow up  History of Present Illness: Matthew [name] is a 82 y.o. male with history of CAD s/p CABG in 1991 with redo bypass in 2004, aortic stenosis s/p TAVR in 04/9162,[id number] combined systolic and diastolic CHF, stable angina, PAD with bilateral common iliac artery kissing stenting in 2015, HTN, anemia, OSA, and GERD who presents forfollow-up of his CAD and CHF.  R/LHC in 05/2016, prior to his TAVR, showed severe underlying 3-vessel CAD with patent grafts (proximal LAD 100% stenosed, mid LCx 100% stenosed, OM2 80% stenosed, proximal RCA 100% stenosed, LIMA-LAD was unable to be cannulated and was anatomically normal and normal in caliber, RIMA-D2 was visualized by non-selective angiography and was anatomically normal, VG-OM2 with a patent, previously placed proximal graft stent, 50% mid VG-OM2 stenosis, VG-OM3 minimal luminal irregularities, VG-RPDA mild diffuse disease), RHC showed high normal pulmonary pressure and filling pressure, normal cardiac output. Continued medical management was advised. At his follow up with Dr. Fletcher Anon in 05/2017 he was doing well, though continued to note intermittent episodes of substernal chest tightness when he was late with taking his medications. TTE from 05/2017 showed an EF of 45-50%, mild concentric LVH, Gr1DD, septal dyssnergy, TAVR prosthesis was noted and functioning normally, moderately to severely dilated left atrium, mildly dilated right atrium. When compared to prior TTE from 06/2016 his EF had dropped from 55% and the LV was more dilated.He was seen in the office on 04/11/2018 for routine follow-up with a weight of 192 pounds, (which was stable when compared to his weight from 05/13/2017).Hecontinuedto note intermittent chest pains if hewas late taking his medications.There was  also some exertional chest pain. He underwent Lexiscan Myoview on 04/14/2018 that showed a medium defect of moderate severity present in the basal inferior septal location with findings consistent with prior MI, EF 36%, left bundle branch block was noted, this was an intermediate risk study. Follow-up echocardiogram on 05/03/2018 showed further reduction of his EF to 30 to 35%, diffuse hypokinesis, grade 2 diastolic dysfunction, bioprosthetic aortic valve was noted with a mean gradient of 14 mmHg, mildly dilated aortic root, mild MR, moderately dilated left atrium, RV systolic function was normal, PASP was normal. Case was discussed with his primary cardiologist who recommended optimization of heart failure medications. Patient was transitioned from metoprolol to carvedilol. In follow up on 5/31, he continued to note a functional decline with associated exertional chest pain. BP precluded further escalation of medications. Patient's family requested LHC. He underwent LHC on 05/15/2018 that showed significant underlying 3-vessel CAD with a patent LIMA-LAD, patent RIMA-diagonal, patent SVG-OM3 and patent SVG-rPDA. The SVG-OM2 was noted to be occluded which was a new finding; however the OM2 was receiving retrograde flow from the SVG-OM3. There was also noted to be possible significant left subclavian artery stenosis, though no gradient was noted with pullback. He was noted to have frequent PVCs before and during the cath. Carotid artery ultrasound on 05/26/18, performed for the noted possibly left subclavian artery stenosis above, showed 1-39% bilateral ICA stenosis with normal flow dynamics in the bilateral subclavian arteries. Holter monitor from 05/2018 showed 5,600 PVCs representing 6% burden with occasional PACs and an average heart rate of 62 bpm. His Coreg was increased to 6.25 mg bid along with holding of his HCTZ.  Patient was most recently seen in  the office on 06/21/2018 and was doing reasonably well from a  cardiac perspective.  It was noted at that time he had continue to take both carvedilol and metoprolol despite [place] discontinuing metoprolol earlier in 2019.  He had also been taking Isordil 3 times daily rather than twice daily.  Following discontinuation of metoprolol his blood pressure improved into the 1 teens to 811B systolic.  This allowed for escalation of carvedilol to 6.25 mg twice daily on 7/15.  Labs checked by patient's PCP on 7/15 showed a slight decrease in his hemoglobin to 10.5 with a baseline of approximately 11-12.  Patient's daughter was concerned with this decrease and has since taken the patient to follow-up with his PCP.  Patient follow-up with his PCP on 7/19 with noted continued drop in his hemoglobin to 9.8.  Patient was sent to the [facility] ED on 7/22 because of this.  Per ED note, patient's stool and urine were both checked for blood with both results being negative.  Outpatient follow up was advised.  He was last seen in the office on 7/24 and was doing reasonably well, without chest pain since the addition of Ranexa.  His SOB was also improved.  Following this visit, he developed a rash that was felt to be secondary to Ranexa and this was stopped. We were notified on 8/21 by the patient's daughter he was having lower extremity swelling following a busy day the day before with a lot of standing up on his feet. This was felt to be most likely dependent edema and leg elevation/compression stockings were advised.   He comes in accompanied by his daughter today. He is doing well from a cardiac perspective. No symptoms concerning for angina or worsening dyspnea, even without Ranexa. He has gone back to doing some weed eating (daughter was not aware of this and upset to find this out). He tolerated this reasonably well, with mild fatigue afterwards, though no angina or dyspnea. He did suffer numerous mosquito bites with this on his lower legs. He seems to be feeling considerably better after  getting a new CPAP mask. No orthopnea, PND, lower extremity swelling, or early satiety. BP readings from home have ranged from the 147W to 295 systolic. His home BP machine is ~ 87-109 years old and does not correlate with ours. No orthopnea, PND, lower extremity swelling, or early satiety. Patient and daughter are pleased with his improvement.   Past Medical History:  Diagnosis Date  . AS (aortic stenosis)    a. Echo 6/10: EF 55% mild AS; b. echo 06/2015; EF 55-60%, GR1DD, moderate AS, Peak velocity (S): 346 cm/s. Mean gradientS): 28 mm Hg. Peak gradient (S): 48 mm Hg. Valve area (VTI): 1.18 cm2;   c. Echo 4/17 - mild LVH, EF 55-60%, no RWMA, Gr 1 DD, mod to severe AS (mean 28 mmHg, peak 46 mmHg), mild LAE  . CAD (coronary artery disease)    a. MI 1996 w/ CABG 1996; b.redo CABG 06/2003; c. Myoview 7/09: EF 54% inferobasal infarct, no ischemia. Myoview 6/10 EF 43% inf wall infarct. no ischemia; c. cath 8/16 s/p DES to VG-OM2. OTW 3VD w/ patent VG->RPDA, VG->OM3,  & LIMA->LAD.  EF 55-65%.  . Carotid bruit    2009 0-39% on dopplers bilatrally  . GERD (gastroesophageal reflux disease)   . History of kidney stones   . HOH (hard of hearing)   . HTN (hypertension)   . Hyperlipidemia   . Myocardial infarction ([place]) 1996  . Nephrolithiasis   .  OSA (obstructive sleep apnea)   . PAD (peripheral artery disease) ([place]) 02/2014   Subtotal occlusion of right common iliac artery and 70% stenosis in the left common iliac artery. Status post bilateral kissing stent placement. 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 Shortness of breath    with exertion  . Thrombocytopenia ([place])     Past Surgical History:  Procedure Laterality Date  . ABDOMINAL AORTAGRAM N/A 03/06/2014   Procedure: ABDOMINAL Maxcine Ham;  Surgeon: Wellington Hampshire, MD;  Location: [place]  CATH LAB;  Service: Cardiovascular;  Laterality: N/A;  . CARDIAC CATHETERIZATION  02/2014   Severe three-vessel coronary artery disease with patent grafts.   Marland Kitchen CARDIAC CATHETERIZATION N/A 07/31/2015   Procedure: Right/Left Heart Cath and Coronary/Graft Angiography;  Surgeon: Wellington Hampshire, MD;  Location: [place] CV LAB;  Service: Cardiovascular;  Laterality: N/A;  . CARDIAC CATHETERIZATION N/A 07/31/2015   Procedure: Coronary Stent Intervention;  Surgeon: Wellington Hampshire, MD;  Location: [place] CV LAB;  Service: Cardiovascular;  Laterality: N/A;  . CARDIAC CATHETERIZATION N/A 05/26/2016   Procedure: Right/Left Heart Cath and Coronary Angiography;  Surgeon: Wellington Hampshire, MD;  Location: [place] CV LAB;  Service: Cardiovascular;  Laterality: N/A;  . CORONARY ARTERY BYPASS GRAFT  1991   at [facility]. Redone 2004-3 vessels 1st time and 4 second time  . CYSTO     2/3 times for kidney stones  . INGUINAL HERNIA REPAIR  11/10/2011   Procedure: HERNIA REPAIR INGUINAL ADULT;  Surgeon: Donato Heinz;  Location: [facility];  Service: General;  Laterality: Right;  . LEFT HEART CATH AND CORONARY ANGIOGRAPHY Left 05/15/2018   Procedure: LEFT HEART CATH AND CORONARY ANGIOGRAPHY;  Surgeon: Wellington Hampshire, MD;  Location: [place] CV LAB;  Service: Cardiovascular;  Laterality: Left;  . LEFT HEART CATHETERIZATION WITH CORONARY/GRAFT ANGIOGRAM N/A 03/06/2014   Procedure: LEFT HEART CATHETERIZATION WITH Beatrix Fetters;  Surgeon: Wellington Hampshire, MD;  Location: [place] CATH LAB;  Service: Cardiovascular;  Laterality: N/A;  . STOMACH SURGERY     removal of gastric ulcers  . TEE WITHOUT CARDIOVERSION N/A 06/01/2016   Procedure: TRANSESOPHAGEAL ECHOCARDIOGRAM (TEE);  Surgeon: Sherren Mocha, MD;  Location: [facility];  Service: Open Heart Surgery;  Laterality: N/A;  . TRANSCATHETER AORTIC VALVE REPLACEMENT, TRANSFEMORAL N/A 06/01/2016   Procedure: TRANSCATHETER AORTIC VALVE REPLACEMENT, TRANSFEMORAL;   Surgeon: Sherren Mocha, MD;  Location: [facility];  Service: Open Heart Surgery;  Laterality: N/A;    Current Meds  Medication Sig  . acetaminophen (TYLENOL) 325 MG tablet Take 650 mg by mouth daily as needed for mild pain or headache.   Marland Kitchen aspirin EC 81 MG tablet Take 1 tablet (81 mg total) by mouth daily.  Marland Kitchen atorvastatin (LIPITOR) 80 MG tablet Take 1 tablet (80 mg total) by mouth daily.  . carvedilol (COREG) 3.125 MG tablet Take 1 tablet (3.125 mg total) by mouth 2 (two) times daily.  . clopidogrel (PLAVIX) 75 MG tablet Take 1 tablet (75 mg total) by mouth daily with breakfast.  . ferrous sulfate 325 (65 FE) MG tablet Take 325 mg by mouth. Takes 3-4 times daily.  Marland Kitchen gabapentin (NEURONTIN) 100 MG capsule Take 1-3 capsules (100-300 mg total) by mouth 4 (four) times daily. Follow written titration schedule.  Marland Kitchen GARLIC PO Take by mouth daily.  . isosorbide dinitrate (ISORDIL) 10 MG  tablet Take 2 tablets (20 mg total) by mouth every morning. Take 10 mg by mouth in the afternoon (Patient taking differently: Take 10 mg by mouth 3 (three) times daily. Take 10 mg by mouth in the afternoon )  . lisinopril (PRINIVIL,ZESTRIL) 10 MG tablet Take 1 tablet (10 mg total) by mouth daily.  . Multiple Vitamins-Minerals (PRESERVISION AREDS PO) Take 1 tablet by mouth daily.   . nitroGLYCERIN (NITROSTAT) 0.4 MG SL tablet Place 0.4 mg under the tongue every 5 (five) minutes as needed for chest pain.  . Omega-3 Fatty Acids (FISH OIL PO) Take by mouth daily.  . pantoprazole (PROTONIX) 40 MG tablet Take 1 tablet (40 mg total) by mouth daily.  . tamsulosin (FLOMAX) 0.4 MG CAPS capsule Take 1 capsule (0.4 mg total) by mouth daily.  . traMADol (ULTRAM) 50 MG tablet Take 1 tablet (50 mg total) daily by mouth. (Patient taking differently: Take 50 mg by mouth daily as needed. )  . TURMERIC PO Take at bedtime by mouth.    Allergies:   Ranexa [ranolazine er]   Social History:  The patient  reports that he quit smoking about 28  years ago. His smoking use included cigarettes. He has a 50.00 pack-year smoking history. He has never used smokeless tobacco. He reports that he does not drink alcohol or use drugs.   Family History:  The patient's family history includes Heart disease in his father and mother.  ROS:   Review of Systems  Constitutional: Positive for malaise/fatigue. Negative for chills, diaphoresis, fever and weight loss.  HENT: Negative for congestion.   Eyes: Negative for discharge and redness.  Respiratory: Negative for cough, hemoptysis, sputum production, shortness of breath and wheezing.   Cardiovascular: Positive for leg swelling. Negative for chest pain, palpitations, orthopnea, claudication and PND.       Improved leg swelling  Gastrointestinal: Negative for abdominal pain, blood in stool, heartburn, melena, nausea and vomiting.  Genitourinary: Negative for hematuria.  Musculoskeletal: Negative for falls and myalgias.  Skin: Positive for rash.       Numerous mosquito bites on his lower legs from weed eating   Neurological: Positive for weakness. Negative for dizziness, tingling, tremors, sensory change, speech change, focal weakness and loss of consciousness.  Endo/Heme/Allergies: Does not bruise/bleed easily.  Psychiatric/Behavioral: Negative for substance abuse. The patient is not nervous/anxious.   All other systems reviewed and are negative.    PHYSICAL EXAM:  VS:  BP (!) 100/52 (BP Location: Left Arm, Patient Position: Sitting, Cuff Size: Normal)   Pulse (!) 59   Ht 5\' 9"  (1.753 m)   Wt 193 lb 8 oz (87.8 kg)   BMI 28.57 kg/m  BMI: Body mass index is 28.57 kg/m.  Physical Exam  Constitutional: He is oriented to person, place, and time. He appears well-developed and well-nourished.  HENT:  Head: Normocephalic and atraumatic.  Eyes: Right eye exhibits no discharge. Left eye exhibits no discharge.  Neck: Normal range of motion. No JVD present.  Cardiovascular: Normal rate, regular  rhythm, S1 normal and S2 normal. Exam reveals no distant heart sounds, no friction rub, no midsystolic click and no opening snap.  Murmur heard.  Harsh midsystolic murmur is present with a grade of 1/6 at the upper right sternal border radiating to the neck. Pulses:      Posterior tibial pulses are 2+ on the right side, and 2+ on the left side.  Pulmonary/Chest: Effort normal and breath sounds normal. No respiratory distress. He has  no decreased breath sounds. He has no wheezes. He has no rales. He exhibits no tenderness.  Abdominal: Soft. He exhibits no distension. There is no tenderness.  Musculoskeletal: He exhibits no edema.  Neurological: He is alert and oriented to person, place, and time.  Skin: Skin is warm and dry. No cyanosis. Nails show no clubbing.  Mild rash along the bilateral lower extremities   Psychiatric: He has a normal mood and affect. His speech is normal and behavior is normal. Judgment and thought content normal.     EKG:  Was ordered and interpreted by me today. Shows sinus bradycardia, 59 bpm, LBBB (old)  Recent Labs: 04/11/2018: ALT 14 05/22/2018: Magnesium 1.9; TSH 2.740 07/03/2018: BUN 16; Creatinine, Ser 0.99; Potassium 3.9; Sodium 140 07/05/2018: Hemoglobin 10.7; Platelets 139  04/11/2018: Chol/HDL Ratio 3.6; Cholesterol, Total 160; HDL 44; LDL Calculated 63; Triglycerides 263   CrCl cannot be calculated (Patient's most recent lab result is older than the maximum 21 days allowed.).   Wt Readings from Last 3 Encounters:  08/21/18 193 lb 8 oz (87.8 kg)  07/05/18 192 lb 12 oz (87.4 kg)  07/03/18 192 lb (87.1 kg)     Other studies reviewed: Additional studies/records reviewed today include: summarized above  ASSESSMENT AND PLAN:  1. CAD involving bypass grafts without angina: He is doing well without any symptoms concerning for angina. Recent LHC as above demonstrating 3/4 patent grafts. With medical management being advised. Unfortunately, he did not  tolerate Ranexa secondary to a rash. Continue DAPT with ASA and Plavix. Most recent HGB stable. Continue Coreg 3.125 mg bid (relative hypotension precludes titration at this time) as well as isosorbide dinitrate 10 mg tid. he prefers to take an additional dose of isosorbide for chest pain as this does not given him the headache that the short-acting nitro does. No plans for further ischemic evaluation at this time. Letter written indicating patient is not appropriate for jury duty given his comorbid conditions as outlined above. Handicap form completed as well.   2. HFrEF secondary to ICM with possible contribution from PVCs: He does not appear grossly volume up at this time. Continue Coreg 3.125 mg bid. Decrease lisinopril to 2.5 mg daily given his relative hypotension. Doubt he will be able to tolerate further escalation of Coreg, much less addition of Entresto in place of his ACEi (following potential washout). Could consider spironolactone in follow up if BP allows.  3. HTN: Blood pressure continues to run on the soft side. His home BP cuff does not seem to correlate with ours in the office today. They may need to acquire a new one. Decrease lisinopril to 2.5 mg daily in an effort to allow for a slightly more permissive BP. Continue to doubt he will tolerate Entresto in place of ACEi given his relative hypotension.   4. HLD: Lipitor 80 mg daily. LDL at goal with recent check in 03/2018.  5. Aortic stenosis s/p TAVR: Stable aortic valve prosthesis by echo as above. Continue to monitor clinically.   6. Frequent PVCs: None noted on EKG today. Coreg as above. Recent ischemic evaluation per HPI. Recent TSH, potassium and magnesium unrevealing.   Disposition: F/u with Dr. Fletcher Anon or an APP in 3 months.   Current medicines are reviewed at length with the patient today.  The patient did not have any concerns regarding medicines.  Signed, Christell Faith, PA-C 08/21/2018 3:20 PM     [place], [place]  [phone] 

## 2018-08-21 ENCOUNTER — Encounter: Payer: Self-pay | Admitting: Physician Assistant

## 2018-08-21 ENCOUNTER — Encounter: Payer: Self-pay | Admitting: *Deleted

## 2018-08-21 ENCOUNTER — Ambulatory Visit: Payer: Medicare HMO | Admitting: Physician Assistant

## 2018-08-21 VITALS — BP 100/52 | HR 59 | Ht 69.0 in | Wt 193.5 lb

## 2018-08-21 DIAGNOSIS — I255 Ischemic cardiomyopathy: Secondary | ICD-10-CM | POA: Diagnosis not present

## 2018-08-21 DIAGNOSIS — I5022 Chronic systolic (congestive) heart failure: Secondary | ICD-10-CM

## 2018-08-21 DIAGNOSIS — I2510 Atherosclerotic heart disease of native coronary artery without angina pectoris: Secondary | ICD-10-CM | POA: Diagnosis not present

## 2018-08-21 DIAGNOSIS — I10 Essential (primary) hypertension: Secondary | ICD-10-CM | POA: Diagnosis not present

## 2018-08-21 DIAGNOSIS — E785 Hyperlipidemia, unspecified: Secondary | ICD-10-CM

## 2018-08-21 DIAGNOSIS — I493 Ventricular premature depolarization: Secondary | ICD-10-CM | POA: Diagnosis not present

## 2018-08-21 DIAGNOSIS — I359 Nonrheumatic aortic valve disorder, unspecified: Secondary | ICD-10-CM

## 2018-08-21 MED ORDER — LISINOPRIL 2.5 MG PO TABS: 2.5000 mg | ORAL_TABLET | Freq: Every day | ORAL | 3 refills | Status: DC

## 2018-08-21 NOTE — Patient Instructions (Signed)
Medication Instructions:  Your physician has recommended you make the following change in your medication:  1- DECREASE Lisinopril to 2.5 mg by mouth once a day.   Labwork: none  Testing/Procedures: none  Follow-Up: Your physician recommends that you schedule a follow-up appointment in: [place].   If you need a refill on your cardiac medications before your next appointment, please call your pharmacy.

## 2018-10-01 ENCOUNTER — Emergency Department: Payer: Medicare HMO

## 2018-10-01 ENCOUNTER — Inpatient Hospital Stay
Admission: EM | Admit: 2018-10-01 | Discharge: 2018-10-07 | DRG: 812 | Disposition: A | Discharge status: Home / Self Care | Payer: Medicare HMO | Attending: Internal Medicine | Admitting: Internal Medicine

## 2018-10-01 ENCOUNTER — Other Ambulatory Visit: Payer: Self-pay

## 2018-10-01 DIAGNOSIS — Z23 Encounter for immunization: Secondary | ICD-10-CM | POA: Diagnosis present

## 2018-10-01 DIAGNOSIS — Z888 Allergy status to other drugs, medicaments and biological substances status: Secondary | ICD-10-CM

## 2018-10-01 DIAGNOSIS — D696 Thrombocytopenia, unspecified: Secondary | ICD-10-CM | POA: Diagnosis present

## 2018-10-01 DIAGNOSIS — I5030 Unspecified diastolic (congestive) heart failure: Secondary | ICD-10-CM | POA: Diagnosis not present

## 2018-10-01 DIAGNOSIS — D509 Iron deficiency anemia, unspecified: Secondary | ICD-10-CM | POA: Diagnosis not present

## 2018-10-01 DIAGNOSIS — K5730 Diverticulosis of large intestine without perforation or abscess without bleeding: Secondary | ICD-10-CM | POA: Diagnosis not present

## 2018-10-01 DIAGNOSIS — I110 Hypertensive heart disease with heart failure: Secondary | ICD-10-CM | POA: Diagnosis present

## 2018-10-01 DIAGNOSIS — R079 Chest pain, unspecified: Secondary | ICD-10-CM | POA: Diagnosis present

## 2018-10-01 DIAGNOSIS — I998 Other disorder of circulatory system: Secondary | ICD-10-CM | POA: Diagnosis present

## 2018-10-01 DIAGNOSIS — E785 Hyperlipidemia, unspecified: Secondary | ICD-10-CM | POA: Diagnosis present

## 2018-10-01 DIAGNOSIS — D62 Acute posthemorrhagic anemia: Secondary | ICD-10-CM | POA: Diagnosis present

## 2018-10-01 DIAGNOSIS — K648 Other hemorrhoids: Secondary | ICD-10-CM | POA: Diagnosis present

## 2018-10-01 DIAGNOSIS — E876 Hypokalemia: Secondary | ICD-10-CM | POA: Diagnosis present

## 2018-10-01 DIAGNOSIS — I214 Non-ST elevation (NSTEMI) myocardial infarction: Secondary | ICD-10-CM

## 2018-10-01 DIAGNOSIS — G4733 Obstructive sleep apnea (adult) (pediatric): Secondary | ICD-10-CM | POA: Diagnosis present

## 2018-10-01 DIAGNOSIS — I429 Cardiomyopathy, unspecified: Secondary | ICD-10-CM | POA: Diagnosis present

## 2018-10-01 DIAGNOSIS — I25118 Atherosclerotic heart disease of native coronary artery with other forms of angina pectoris: Secondary | ICD-10-CM | POA: Diagnosis present

## 2018-10-01 DIAGNOSIS — I745 Embolism and thrombosis of iliac artery: Secondary | ICD-10-CM | POA: Diagnosis present

## 2018-10-01 DIAGNOSIS — Z79899 Other long term (current) drug therapy: Secondary | ICD-10-CM

## 2018-10-01 DIAGNOSIS — I350 Nonrheumatic aortic (valve) stenosis: Secondary | ICD-10-CM | POA: Diagnosis present

## 2018-10-01 DIAGNOSIS — K219 Gastro-esophageal reflux disease without esophagitis: Secondary | ICD-10-CM | POA: Diagnosis present

## 2018-10-01 DIAGNOSIS — Z7982 Long term (current) use of aspirin: Secondary | ICD-10-CM

## 2018-10-01 DIAGNOSIS — I5042 Chronic combined systolic (congestive) and diastolic (congestive) heart failure: Secondary | ICD-10-CM | POA: Diagnosis present

## 2018-10-01 DIAGNOSIS — K644 Residual hemorrhoidal skin tags: Secondary | ICD-10-CM | POA: Diagnosis present

## 2018-10-01 DIAGNOSIS — Z952 Presence of prosthetic heart valve: Secondary | ICD-10-CM

## 2018-10-01 DIAGNOSIS — R0989 Other specified symptoms and signs involving the circulatory and respiratory systems: Secondary | ICD-10-CM | POA: Diagnosis present

## 2018-10-01 DIAGNOSIS — Z87891 Personal history of nicotine dependence: Secondary | ICD-10-CM

## 2018-10-01 DIAGNOSIS — Z8249 Family history of ischemic heart disease and other diseases of the circulatory system: Secondary | ICD-10-CM

## 2018-10-01 DIAGNOSIS — K921 Melena: Secondary | ICD-10-CM | POA: Diagnosis not present

## 2018-10-01 DIAGNOSIS — I252 Old myocardial infarction: Secondary | ICD-10-CM | POA: Diagnosis not present

## 2018-10-01 DIAGNOSIS — Z79891 Long term (current) use of opiate analgesic: Secondary | ICD-10-CM

## 2018-10-01 DIAGNOSIS — H9190 Unspecified hearing loss, unspecified ear: Secondary | ICD-10-CM | POA: Diagnosis present

## 2018-10-01 DIAGNOSIS — I6523 Occlusion and stenosis of bilateral carotid arteries: Secondary | ICD-10-CM | POA: Diagnosis present

## 2018-10-01 DIAGNOSIS — D123 Benign neoplasm of transverse colon: Secondary | ICD-10-CM | POA: Diagnosis not present

## 2018-10-01 DIAGNOSIS — I248 Other forms of acute ischemic heart disease: Secondary | ICD-10-CM | POA: Diagnosis present

## 2018-10-01 DIAGNOSIS — D120 Benign neoplasm of cecum: Secondary | ICD-10-CM | POA: Diagnosis not present

## 2018-10-01 DIAGNOSIS — D500 Iron deficiency anemia secondary to blood loss (chronic): Secondary | ICD-10-CM | POA: Diagnosis not present

## 2018-10-01 DIAGNOSIS — I739 Peripheral vascular disease, unspecified: Secondary | ICD-10-CM | POA: Diagnosis present

## 2018-10-01 DIAGNOSIS — Z951 Presence of aortocoronary bypass graft: Secondary | ICD-10-CM

## 2018-10-01 DIAGNOSIS — E538 Deficiency of other specified B group vitamins: Secondary | ICD-10-CM | POA: Diagnosis present

## 2018-10-01 DIAGNOSIS — K635 Polyp of colon: Secondary | ICD-10-CM | POA: Diagnosis present

## 2018-10-01 DIAGNOSIS — Z87442 Personal history of urinary calculi: Secondary | ICD-10-CM

## 2018-10-01 DIAGNOSIS — K449 Diaphragmatic hernia without obstruction or gangrene: Secondary | ICD-10-CM | POA: Diagnosis present

## 2018-10-01 DIAGNOSIS — Z955 Presence of coronary angioplasty implant and graft: Secondary | ICD-10-CM

## 2018-10-01 DIAGNOSIS — D649 Anemia, unspecified: Secondary | ICD-10-CM | POA: Diagnosis present

## 2018-10-01 DIAGNOSIS — I447 Left bundle-branch block, unspecified: Secondary | ICD-10-CM | POA: Diagnosis present

## 2018-10-01 DIAGNOSIS — K31819 Angiodysplasia of stomach and duodenum without bleeding: Secondary | ICD-10-CM | POA: Diagnosis present

## 2018-10-01 DIAGNOSIS — Z7902 Long term (current) use of antithrombotics/antiplatelets: Secondary | ICD-10-CM

## 2018-10-01 DIAGNOSIS — N400 Enlarged prostate without lower urinary tract symptoms: Secondary | ICD-10-CM | POA: Diagnosis present

## 2018-10-01 DIAGNOSIS — I77810 Thoracic aortic ectasia: Secondary | ICD-10-CM | POA: Diagnosis present

## 2018-10-01 DIAGNOSIS — K922 Gastrointestinal hemorrhage, unspecified: Secondary | ICD-10-CM | POA: Diagnosis not present

## 2018-10-01 LAB — BASIC METABOLIC PANEL
Anion gap: 8 (ref 5–15)
BUN: 17 mg/dL (ref 8–23)
CHLORIDE: 106 mmol/L (ref 98–111)
CO2: 23 mmol/L (ref 22–32)
CREATININE: 1.33 mg/dL — AB (ref 0.61–1.24)
Calcium: 8.4 mg/dL — ABNORMAL LOW (ref 8.9–10.3)
GFR calc Af Amer: 57 mL/min — ABNORMAL LOW (ref >60)
GFR, EST NON AFRICAN AMERICAN: 49 mL/min — AB (ref >60)
Glucose, Bld: 179 mg/dL — ABNORMAL HIGH (ref 70–99)
Potassium: 3.4 mmol/L — ABNORMAL LOW (ref 3.5–5.1)
SODIUM: 137 mmol/L (ref 135–145)

## 2018-10-01 LAB — CBC
HEMATOCRIT: 22.7 % — AB (ref 39.0–52.0)
Hemoglobin: 7.1 g/dL — ABNORMAL LOW (ref 13.0–17.0)
MCH: 30.3 pg (ref 26.0–34.0)
MCHC: 31.3 g/dL (ref 30.0–36.0)
MCV: 97 fL (ref 80.0–100.0)
Platelets: 118 10*3/uL — ABNORMAL LOW (ref 150–400)
RBC: 2.34 MIL/uL — ABNORMAL LOW (ref 4.22–5.81)
RDW: 17.5 % — ABNORMAL HIGH (ref 11.5–15.5)
WBC: 5 10*3/uL (ref 4.0–10.5)
nRBC: 0 % (ref 0.0–0.2)

## 2018-10-01 LAB — TROPONIN I: Troponin I: 0.65 ng/mL (ref <0.03)

## 2018-10-01 LAB — PREPARE RBC (CROSSMATCH)

## 2018-10-01 LAB — ABO/RH: ABO/RH(D): O NEG

## 2018-10-01 MED ORDER — INFLUENZA VAC SPLIT HIGH-DOSE 0.5 ML IM SUSY
0.5000 mL | PREFILLED_SYRINGE | INTRAMUSCULAR | Status: DC
  Filled 2018-10-01: qty 0.5

## 2018-10-01 MED ORDER — ONDANSETRON HCL 4 MG/2ML IJ SOLN: 4.0000 mg | Freq: Four times a day (QID) | INTRAMUSCULAR | Status: DC | PRN

## 2018-10-01 MED ORDER — FERROUS SULFATE 325 (65 FE) MG PO TABS
325.0000 mg | ORAL_TABLET | Freq: Every day | ORAL | Status: DC
  Administered 2018-10-02 – 2018-10-05 (×4): 325 mg via ORAL
  Filled 2018-10-01 (×5): qty 1

## 2018-10-01 MED ORDER — CARVEDILOL 3.125 MG PO TABS
3.1250 mg | ORAL_TABLET | Freq: Two times a day (BID) | ORAL | Status: DC
  Administered 2018-10-02 – 2018-10-07 (×9): 3.125 mg via ORAL
  Filled 2018-10-01 (×11): qty 1

## 2018-10-01 MED ORDER — SODIUM CHLORIDE 0.9 % IV SOLN
INTRAVENOUS | Status: AC
  Administered 2018-10-01 – 2018-10-02 (×2): via INTRAVENOUS

## 2018-10-01 MED ORDER — ISOSORBIDE DINITRATE 20 MG PO TABS
20.0000 mg | ORAL_TABLET | ORAL | Status: DC
  Administered 2018-10-02 – 2018-10-07 (×6): 20 mg via ORAL
  Filled 2018-10-01 (×6): qty 1

## 2018-10-01 MED ORDER — ATORVASTATIN CALCIUM 20 MG PO TABS
80.0000 mg | ORAL_TABLET | Freq: Every day | ORAL | Status: DC
  Administered 2018-10-02 – 2018-10-07 (×6): 80 mg via ORAL
  Filled 2018-10-01 (×6): qty 4

## 2018-10-01 MED ORDER — NITROGLYCERIN 0.4 MG SL SUBL: 0.4000 mg | SUBLINGUAL_TABLET | SUBLINGUAL | Status: DC | PRN

## 2018-10-01 MED ORDER — TAMSULOSIN HCL 0.4 MG PO CAPS
0.4000 mg | ORAL_CAPSULE | Freq: Every day | ORAL | Status: DC
  Administered 2018-10-02 – 2018-10-07 (×6): 0.4 mg via ORAL
  Filled 2018-10-01 (×6): qty 1

## 2018-10-01 MED ORDER — ACETAMINOPHEN 325 MG PO TABS
650.0000 mg | ORAL_TABLET | ORAL | Status: DC | PRN
  Administered 2018-10-02 – 2018-10-04 (×3): 650 mg via ORAL
  Filled 2018-10-01 (×3): qty 2

## 2018-10-01 MED ORDER — ASPIRIN EC 81 MG PO TBEC: 81.0000 mg | DELAYED_RELEASE_TABLET | Freq: Every day | ORAL | Status: DC

## 2018-10-01 MED ORDER — LISINOPRIL 5 MG PO TABS
2.5000 mg | ORAL_TABLET | Freq: Every day | ORAL | Status: DC
  Administered 2018-10-02 – 2018-10-07 (×6): 2.5 mg via ORAL
  Filled 2018-10-01 (×6): qty 1

## 2018-10-01 MED ORDER — RAMELTEON 8 MG PO TABS
8.0000 mg | ORAL_TABLET | Freq: Every day | ORAL | Status: DC
  Administered 2018-10-02 – 2018-10-06 (×6): 8 mg via ORAL
  Filled 2018-10-01 (×7): qty 1

## 2018-10-01 MED ORDER — SODIUM CHLORIDE 0.9 % IV SOLN: 10.0000 mL/h | Freq: Once | INTRAVENOUS | Status: DC

## 2018-10-01 NOTE — ED Notes (Signed)
Blue top also sent to lab

## 2018-10-01 NOTE — ED Triage Notes (Addendum)
Pt arrives to ED c/o of central CP x 10 days. States has talked to his MD and they told him to take an ASA when he got CP. Pt denies other symptoms. Wife states SOB when moving around. A&Ox4. Pt is HOH. Hx open heart surgery x 2, cardiac cath x 5.

## 2018-10-01 NOTE — Plan of Care (Signed)
Patient admitted at 2130. No complaints of pain. Spouse is at the bedside. Patient profile completed. Initiated blood transfusion. No reaction noted at 15 minute interval.

## 2018-10-01 NOTE — Progress Notes (Signed)
Family Meeting Note  Advance Directive:yes  Today a meeting took place with the Patient ,wife at bed side     The following clinical team members were present during this meeting:MD  The following were discussed:Patient's diagnosis: Non-STEMI, symptomatic anemia, stool for occult blood positive, acute kidney injury, hypokalemia, other comorbidities history of coronary artery disease, obstructive sleep apnea, GERD, hypertension, hyperlipidemia, peripheral arterial disease, other comorbidities and treatment plan of care discussed in detail with the patient and wife at bedside.  They both verbalized understanding of the plan.     Patient's progosis: Unable to determine and Goals for treatment: Full Code, healthcare power of attorney daughter Marta Lamas  Additional follow-up to be provided: Hospitalist and cardiology  Time spent during discussion:17 min  Nicholes Mango, MD

## 2018-10-01 NOTE — ED Provider Notes (Signed)
[facility] Emergency Department Provider Note       Time seen: ----------------------------------------- 7:49 PM on 10/01/2018 -----------------------------------------   I have reviewed the triage vital signs and the nursing notes.  HISTORY   Chief Complaint Chest Pain    HPI Matthew [name] is a 82 y.o. male with a history of extensive coronary artery disease, aortic stenosis, hyperlipidemia, MI, peripheral vascular disease, and thrombocytopenia who presents to the ED for chest pain for the past 10 days.  Patient states he is talked to his doctor and they told him to take aspirin when he got chest pain.  He denies any other symptoms.  Wife states he has been short of breath when moving around.  Patient also states he has seen black stool.  Past Medical History:  Diagnosis Date  . AS (aortic stenosis)    a. Echo 6/10: EF 55% mild AS; b. echo 06/2015; EF 55-60%, GR1DD, moderate AS, Peak velocity (S): 346 cm/s. Mean gradientS): 28 mm Hg. Peak gradient (S): 48 mm Hg. Valve area (VTI): 1.18 cm2;   c. Echo 4/17 - mild LVH, EF 55-60%, no RWMA, Gr 1 DD, mod to severe AS (mean 28 mmHg, peak 46 mmHg), mild LAE  . CAD (coronary artery disease)    a. MI 1996 w/ CABG 1996; b.redo CABG 06/2003; c. Myoview 7/09: EF 54% inferobasal infarct, no ischemia. Myoview 6/10 EF 43% inf wall infarct. no ischemia; c. cath 8/16 s/p DES to VG-OM2. OTW 3VD w/ patent VG->RPDA, VG->OM3,  & LIMA->LAD.  EF 55-65%.  . Carotid bruit    2009 0-39% on dopplers bilatrally  . GERD (gastroesophageal reflux disease)   . History of kidney stones   . HOH (hard of hearing)   . HTN (hypertension)   . Hyperlipidemia   . Myocardial infarction ([place]) 1996  . Nephrolithiasis   . OSA (obstructive sleep apnea)   . PAD (peripheral artery disease) ([place]) 02/2014   Subtotal occlusion of right common iliac artery and 70% stenosis in the left common iliac artery. Status post bilateral kissing stent placement.  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 Shortness of breath    with exertion  . Thrombocytopenia [facility])     Patient Active Problem List   Diagnosis Date Noted  . Lumbar spinal stenosis 12/26/2017  . Abnormal MRI, lumbar spine 12/26/2017  . Chronic lumbar radiculopathy 12/26/2017  . Chronic lower extremity radicular pain (S1 dermatome) (Bilateral) (L>R) 11/15/2017  . Long term current use of anticoagulant (Plavix) 09/28/2017  . History of MI (myocardial infarction) (Last 2004) 09/28/2017  . History of alcoholism ([place]) 09/28/2017  . Neurogenic pain 09/28/2017  . Lumbar facet syndrome (Bilateral) (L>R) 09/28/2017  . DDD (degenerative disc disease), lumbar 09/28/2017  . Lumbar facet osteoarthritis 09/28/2017  . Lumbar spondylosis with radiculopathy 09/28/2017  . Gastroesophageal reflux disease without esophagitis 09/28/2017  . Chronic hip pain 09/01/2017  . Long term current use of opiate analgesic 08/31/2017  . Long term prescription opiate use 08/31/2017  . Opiate use 08/31/2017  . Chronic low back pain (Primary Area of Pain) (Bilateral) (L>R) 08/31/2017  . Chronic lower extremity pain (Bilateral) (L>R) 08/31/2017  . Chronic pain syndrome 08/31/2017  . Chronic sacroiliac joint pain (Bilateral) (L>R) 08/31/2017  . Severe aortic stenosis 05/25/2016  . Thrombocytopenia ([place])   . Coronary artery disease 08/01/2015  .  Effort angina ([place]) 07/31/2015  . PAD (peripheral artery disease) ([place]) 03/22/2014  . Angina pectoris ([place]) 02/14/2014  . Aortic valve Replacement 05/01/2009  . Obstructive sleep apnea 03/05/2008  . HLD (hyperlipidemia) 03/04/2008  . Essential hypertension 03/04/2008  . Coronary atherosclerosis 03/04/2008    Past Surgical History:  Procedure Laterality Date  . ABDOMINAL AORTAGRAM N/A 03/06/2014    Procedure: ABDOMINAL Maxcine Ham;  Surgeon: Wellington Hampshire, MD;  Location: [place] CATH LAB;  Service: Cardiovascular;  Laterality: N/A;  . CARDIAC CATHETERIZATION  02/2014   Severe three-vessel coronary artery disease with patent grafts.   Marland Kitchen CARDIAC CATHETERIZATION N/A 07/31/2015   Procedure: Right/Left Heart Cath and Coronary/Graft Angiography;  Surgeon: Wellington Hampshire, MD;  Location: [place] CV LAB;  Service: Cardiovascular;  Laterality: N/A;  . CARDIAC CATHETERIZATION N/A 07/31/2015   Procedure: Coronary Stent Intervention;  Surgeon: Wellington Hampshire, MD;  Location: [place] CV LAB;  Service: Cardiovascular;  Laterality: N/A;  . CARDIAC CATHETERIZATION N/A 05/26/2016   Procedure: Right/Left Heart Cath and Coronary Angiography;  Surgeon: Wellington Hampshire, MD;  Location: [facility] [facility] CV LAB;  Service: Cardiovascular;  Laterality: N/A;  . CORONARY ARTERY BYPASS GRAFT  1991   at [facility] Redone 2004-3 vessels 1st time and 4 second time  . CYSTO     2/3 times for kidney stones  . INGUINAL HERNIA REPAIR  11/10/2011   Procedure: HERNIA REPAIR INGUINAL ADULT;  Surgeon: Donato Heinz;  [facility];  Service: General;  Laterality: Right;  . LEFT HEART CATH AND CORONARY ANGIOGRAPHY Left 05/15/2018   Procedure: LEFT HEART CATH AND CORONARY ANGIOGRAPHY;  Surgeon: Wellington Hampshire, MD;  Location: [place] CV LAB;  Service: Cardiovascular;  Laterality: Left;  . LEFT HEART CATHETERIZATION WITH CORONARY/GRAFT ANGIOGRAM N/A 03/06/2014   Procedure: LEFT HEART CATHETERIZATION WITH Beatrix Fetters;  Surgeon: Wellington Hampshire, MD;  Location: [place] CATH LAB;  Service: Cardiovascular;  Laterality: N/A;  . STOMACH SURGERY     removal of gastric ulcers  . TEE WITHOUT CARDIOVERSION N/A 06/01/2016   Procedure: TRANSESOPHAGEAL ECHOCARDIOGRAM (TEE);  Surgeon: Sherren Mocha, MD;  Location: [place];  Service: Open Heart Surgery;  Laterality: N/A;  . TRANSCATHETER AORTIC VALVE REPLACEMENT, TRANSFEMORAL N/A  06/01/2016   Procedure: TRANSCATHETER AORTIC VALVE REPLACEMENT, TRANSFEMORAL;  Surgeon: Sherren Mocha, MD;  Location: [place];  Service: Open Heart Surgery;  Laterality: N/A;    Allergies Ranexa [ranolazine er]  Social History Social History   Tobacco Use  . Smoking status: Former Smoker    Packs/day: 1.00    Years: 50.00    Pack years: 50.00    Types: Cigarettes    Last attempt to quit: 01/24/1990    Years since quitting: 28.7  . Smokeless tobacco: Never Used  Substance Use Topics  . Alcohol use: No    Alcohol/week: 0.0 standard drinks    Comment: Alcoholic  . Drug use: No   Review of Systems Constitutional: Negative for fever. Cardiovascular: Positive for chest pain Respiratory: Positive for shortness of breath Gastrointestinal: Negative for abdominal pain, vomiting and diarrhea.  Positive for dark stool Musculoskeletal: Negative for back pain. Skin: Negative for rash. Neurological: Negative for headaches, focal weakness or numbness.  All systems negative/normal/unremarkable except as stated in the HPI  ____________________________________________   PHYSICAL EXAM:  VITAL SIGNS: ED Triage Vitals  Enc Vitals Group     BP 10/01/18 1848 (!) 144/40     Pulse Rate 10/01/18 1848 87     Resp 10/01/18 1848  18     Temp 10/01/18 1848 97.7 F (36.5 C)     Temp Source 10/01/18 1848 Oral     SpO2 10/01/18 1848 100 %     Weight 10/01/18 1849 190 lb (86.2 kg)     Height 10/01/18 1849 5\' 9"  (1.753 m)     Head Circumference --      Peak Flow --      Pain Score 10/01/18 1849 2     Pain Loc --      Pain Edu? --      Excl. in [place]? --    Constitutional: Alert and oriented. Well appearing and in no distress.  Patient is very hard of hearing Eyes: Conjunctivae are normal. Normal extraocular movements. ENT   Head: Normocephalic and atraumatic.   Nose: No congestion/rhinnorhea.   Mouth/Throat: Mucous membranes are moist.   Neck: No stridor. Cardiovascular: Normal  rate, regular rhythm.  Systolic murmur Respiratory: Normal respiratory effort without tachypnea nor retractions. Breath sounds are clear and equal bilaterally. No wheezes/rales/rhonchi. Gastrointestinal: Soft and nontender. Normal bowel sounds Rectal: Heme positive stools are noted Musculoskeletal: Nontender with normal range of motion in extremities. No lower extremity tenderness nor edema. Neurologic:  Normal speech and language. No gross focal neurologic deficits are appreciated.  Skin:  Skin is warm, dry and intact. No rash noted. Psychiatric: Mood and affect are normal. Speech and behavior are normal.  ____________________________________________  EKG: Interpreted by me.  Sinus rhythm with a rate of 84 bpm, left axis deviation, left bundle branch block, long QT  ____________________________________________  ED COURSE:  As part of my medical decision making, I reviewed the following data within the [place] History obtained from family if available, nursing notes, old chart and ekg, as well as notes from prior ED visits. Patient presented for chest pain for the past 10 days, we will assess with labs and imaging as indicated at this time.   Procedures ____________________________________________   LABS (pertinent positives/negatives)  Labs Reviewed  BASIC METABOLIC PANEL - Abnormal; Notable for the following components:      Result Value   Potassium 3.4 (*)    Glucose, Bld 179 (*)    Creatinine, Ser 1.33 (*)    Calcium 8.4 (*)    GFR calc non Af Amer 49 (*)    GFR calc Af Amer 57 (*)    All other components within normal limits  TROPONIN I - Abnormal; Notable for the following components:   Troponin I 0.65 (*)    All other components within normal limits  CBC   CRITICAL CARE Performed by: Laurence Aly   Total critical care time: 15 minutes  Critical care time was exclusive of separately billable procedures and treating other  patients.  Critical care was necessary to treat or prevent imminent or life-threatening deterioration.  Critical care was time spent personally by me on the following activities: development of treatment plan with patient and/or surrogate as well as nursing, discussions with consultants, evaluation of patient's response to treatment, examination of patient, obtaining history from patient or surrogate, ordering and performing treatments and interventions, ordering and review of laboratory studies, ordering and review of radiographic studies, pulse oximetry and re-evaluation of patient's condition.  RADIOLOGY  Chest x-ray is unremarkable  ____________________________________________  DIFFERENTIAL DIAGNOSIS   Unstable angina, MI, PE, pneumothorax, anemia  FINAL ASSESSMENT AND PLAN  Non-STEMI, anemia, GI bleed  Plan: The patient had presented for chest pain shortness of breath. Patient's labs  do indicate significant anemia compared to prior with a hemoglobin of 7.1. Patient's imaging is negative.  I did discuss with Dr. Fletcher Anon from cardiology.  We will trend his troponins, I have ordered a unit of blood which I think will resolve his symptoms.  He is likely having GI bleeding from the extra aspirin he is taking.  I will discuss with the hospitalist for admission.   Laurence Aly, MD   Note: This note was generated in part or whole with voice recognition software. Voice recognition is usually quite accurate but there are transcription errors that can and very often do occur. I apologize for any typographical errors that were not detected and corrected.     Earleen Newport, MD 10/01/18 2007

## 2018-10-01 NOTE — H&P (Signed)
[address] [facility] [place] NAME: Matthew [name]    MR#:  [id number]  DATE OF BIRTH:  1936-02-05  DATE OF ADMISSION:  10/01/2018  PRIMARY CARE PHYSICIAN: Barry Dienes, NP   REQUESTING/REFERRING PHYSICIAN: Dr. Jimmye Norman  CHIEF COMPLAINT:  Chest pain  HISTORY OF PRESENT ILLNESS:  Matthew [name]  is a 82 y.o. male with a known history of aortic stenosis, extensive coronary artery disease, hyperlipidemia, peripheral vascular disease, hypertension, obstructive sleep apnea and other medical problems is presenting to the ED with a chief complaint of chest pain for the past 10 days.  Patient doctor has adjusted him to take aspirin for chest pain.  Patient was also reporting shortness of breath with minimal exertion.  He noticed black stool for the past few days.  In the ED stool for occult blood is positive and hemoglobin dropped from 10.7 in July to 7.0.  Initial troponin 0 0.65 EKG with no ST changes.  ED physician Dr. Jimmye Norman has discussed with patient's primary cardiology Dr. Fletcher Anon who has not recommended heparin drip and just suggested to cycle troponins and transfuse 1 unit of blood.  During my examination patient denies any chest pain or shortness of breath.  Wife at bedside.  PAST MEDICAL HISTORY:   Past Medical History:  Diagnosis Date  . AS (aortic stenosis)    a. Echo 6/10: EF 55% mild AS; b. echo 06/2015; EF 55-60%, GR1DD, moderate AS, Peak velocity (S): 346 cm/s. Mean gradientS): 28 mm Hg. Peak gradient (S): 48 mm Hg. Valve area (VTI): 1.18 cm2;   c. Echo 4/17 - mild LVH, EF 55-60%, no RWMA, Gr 1 DD, mod to severe AS (mean 28 mmHg, peak 46 mmHg), mild LAE  . CAD (coronary artery disease)    a. MI 1996 w/ CABG 1996; b.redo CABG 06/2003; c. Myoview 7/09: EF 54% inferobasal infarct, no ischemia. Myoview 6/10 EF 43% inf wall infarct. no ischemia; c. cath 8/16 s/p DES to VG-OM2. OTW 3VD w/ patent VG->RPDA, VG->OM3,  & LIMA->LAD.  EF 55-65%.  . Carotid  bruit    2009 0-39% on dopplers bilatrally  . GERD (gastroesophageal reflux disease)   . History of kidney stones   . HOH (hard of hearing)   . HTN (hypertension)   . Hyperlipidemia   . Myocardial infarction ([place]) 1996  . Nephrolithiasis   . OSA (obstructive sleep apnea)   . PAD (peripheral artery disease) ([place]) 02/2014   Subtotal occlusion of right common iliac artery and 70% stenosis in the left common iliac artery. Status post bilateral kissing stent placement. 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 Shortness of breath    with exertion  . Thrombocytopenia ([place])     PAST SURGICAL HISTOIRY:   Past Surgical History:  Procedure Laterality Date  . ABDOMINAL AORTAGRAM N/A 03/06/2014   Procedure: ABDOMINAL Maxcine Ham;  Surgeon: Wellington Hampshire, MD;  Location: [place] CATH LAB;  Service: Cardiovascular;  Laterality: N/A;  . CARDIAC CATHETERIZATION  02/2014   Severe three-vessel coronary artery disease with patent grafts.   Marland Kitchen CARDIAC CATHETERIZATION N/A 07/31/2015   Procedure: Right/Left Heart Cath and Coronary/Graft Angiography;  Surgeon: Wellington Hampshire, MD;  Location: [place] CV LAB;  Service: Cardiovascular;  Laterality: N/A;  . CARDIAC CATHETERIZATION N/A 07/31/2015   Procedure: Coronary Stent Intervention;  Surgeon: Mertie Clause  Fletcher Anon, MD;  Location: [place] CV LAB;  Service: Cardiovascular;  Laterality: N/A;  . CARDIAC CATHETERIZATION N/A 05/26/2016   Procedure: Right/Left Heart Cath and Coronary Angiography;  Surgeon: Wellington Hampshire, MD;  Location: [place] CV LAB;  Service: Cardiovascular;  Laterality: N/A;  . CORONARY ARTERY BYPASS GRAFT  1991   at [facility]. Redone 2004-3 vessels 1st time and 4 second time  . CYSTO     2/3 times for kidney stones  . INGUINAL HERNIA REPAIR  11/10/2011   Procedure: HERNIA  REPAIR INGUINAL ADULT;  Surgeon: Donato Heinz;  [facility];  Service: General;  Laterality: Right;  . LEFT HEART CATH AND CORONARY ANGIOGRAPHY Left 05/15/2018   Procedure: LEFT HEART CATH AND CORONARY ANGIOGRAPHY;  Surgeon: Wellington Hampshire, MD;  Location: [place] CV LAB;  Service: Cardiovascular;  Laterality: Left;  . LEFT HEART CATHETERIZATION WITH CORONARY/GRAFT ANGIOGRAM N/A 03/06/2014   Procedure: LEFT HEART CATHETERIZATION WITH Beatrix Fetters;  Surgeon: Wellington Hampshire, MD;  Location: [place] CATH LAB;  Service: Cardiovascular;  Laterality: N/A;  . STOMACH SURGERY     removal of gastric ulcers  . TEE WITHOUT CARDIOVERSION N/A 06/01/2016   Procedure: TRANSESOPHAGEAL ECHOCARDIOGRAM (TEE);  Surgeon: Sherren Mocha, MD;  Location: [place];  Service: Open Heart Surgery;  Laterality: N/A;  . TRANSCATHETER AORTIC VALVE REPLACEMENT, TRANSFEMORAL N/A 06/01/2016   Procedure: TRANSCATHETER AORTIC VALVE REPLACEMENT, TRANSFEMORAL;  Surgeon: Sherren Mocha, MD;  Location: [place];  Service: Open Heart Surgery;  Laterality: N/A;    SOCIAL HISTORY:   Social History   Tobacco Use  . Smoking status: Former Smoker    Packs/day: 1.00    Years: 50.00    Pack years: 50.00    Types: Cigarettes    Last attempt to quit: 01/24/1990    Years since quitting: 28.7  . Smokeless tobacco: Never Used  Substance Use Topics  . Alcohol use: No    Alcohol/week: 0.0 standard drinks    Comment: Alcoholic    FAMILY HISTORY:   Family History  Problem Relation Age of Onset  . Heart disease Father   . Heart disease Mother     DRUG ALLERGIES:   Allergies  Allergen Reactions  . Ranexa [Ranolazine Er] Rash    REVIEW OF SYSTEMS:  CONSTITUTIONAL: No fever, fatigue or weakness.  EYES: No blurred or double vision.  EARS, NOSE, AND THROAT: No tinnitus or ear pain.  RESPIRATORY: No cough, shortness of breath, wheezing or hemoptysis.  CARDIOVASCULAR: No chest pain, orthopnea, edema.   GASTROINTESTINAL: No nausea, vomiting, diarrhea or abdominal pain.  Reporting black tarry stool GENITOURINARY: No dysuria, hematuria.  ENDOCRINE: No polyuria, nocturia,  HEMATOLOGY: No anemia, easy bruising or bleeding SKIN: No rash or lesion. MUSCULOSKELETAL: No joint pain or arthritis.   NEUROLOGIC: No tingling, numbness, weakness.  PSYCHIATRY: No anxiety or depression.   MEDICATIONS AT HOME:   Prior to Admission medications   Medication Sig Start Date End Date Taking? Authorizing Provider  acetaminophen (TYLENOL) 325 MG tablet Take 650 mg by mouth daily as needed for mild pain or headache.     [provider]  aspirin EC 81 MG tablet Take 1 tablet (81 mg total) by mouth daily. 03/22/14   Wellington Hampshire, MD  atorvastatin (LIPITOR) 80 MG tablet Take 1 tablet (80 mg total) by mouth daily. 05/23/18   Wellington Hampshire, MD  carvedilol (COREG) 3.125 MG tablet Take 1 tablet (3.125 mg total) by mouth 2 (two) times daily. 07/05/18  Rise Mu, PA-C  clopidogrel (PLAVIX) 75 MG tablet Take 1 tablet (75 mg total) by mouth daily with breakfast. 05/23/18   Wellington Hampshire, MD  ferrous sulfate 325 (65 FE) MG tablet Take 325 mg by mouth. Takes 3-4 times daily.    [provider]  gabapentin (NEURONTIN) 100 MG capsule Take 1-3 capsules (100-300 mg total) by mouth 4 (four) times daily. Follow written titration schedule. 05/15/18 08/21/18  Vevelyn Francois, NP  GARLIC PO Take by mouth daily.    [provider]  isosorbide dinitrate (ISORDIL) 10 MG tablet Take 2 tablets (20 mg total) by mouth every morning. Take 10 mg by mouth in the afternoon Patient taking differently: Take 10 mg by mouth 3 (three) times daily. Take 10 mg by mouth in the afternoon  04/11/18   Rise Mu, PA-C  lisinopril (PRINIVIL,ZESTRIL) 2.5 MG tablet Take 1 tablet (2.5 mg total) by mouth daily. 08/21/18 11/19/18  Rise Mu, PA-C  Multiple Vitamins-Minerals (PRESERVISION AREDS PO) Take 1 tablet by mouth daily.      [provider]  nitroGLYCERIN (NITROSTAT) 0.4 MG SL tablet Place 0.4 mg under the tongue every 5 (five) minutes as needed for chest pain.    [provider]  Omega-3 Fatty Acids (FISH OIL PO) Take by mouth daily.    [provider]  pantoprazole (PROTONIX) 40 MG tablet Take 1 tablet (40 mg total) by mouth daily. 05/31/18   Dunn, Areta Haber, PA-C  tamsulosin (FLOMAX) 0.4 MG CAPS capsule Take 1 capsule (0.4 mg total) by mouth daily. 06/21/18   Dunn, Areta Haber, PA-C  traMADol (ULTRAM) 50 MG tablet Take 1 tablet (50 mg total) daily by mouth. Patient taking differently: Take 50 mg by mouth daily as needed.  12/12/17 08/21/18  Milinda Pointer, MD  TURMERIC PO Take at bedtime by mouth.    [provider]      VITAL SIGNS:  Blood pressure (!) 148/55, pulse 65, temperature 98.5 F (36.9 C), temperature source Oral, resp. rate 20, height 5\' 9"  (1.753 m), weight 86.2 kg, SpO2 100 %.  PHYSICAL EXAMINATION:  GENERAL:  82 y.o.-year-old patient lying in the bed with no acute distress.  EYES: Pupils equal, round, reactive to light and accommodation. No scleral icterus. Extraocular muscles intact.  HEENT: Head atraumatic, normocephalic. Oropharynx and nasopharynx clear.  NECK:  Supple, no jugular venous distention. No thyroid enlargement, no tenderness.  LUNGS: Normal breath sounds bilaterally, no wheezing, rales,rhonchi or crepitation. No use of accessory muscles of respiration.  CARDIOVASCULAR: S1, S2 normal. No murmurs, rubs, or gallops.  ABDOMEN: Soft, nontender, nondistended. Bowel sounds present. EXTREMITIES: No pedal edema, cyanosis, or clubbing.  NEUROLOGIC: Patient is awake and alert, oriented x3. Sensation intact. Gait not checked.  PSYCHIATRIC: The patient is alert and oriented x 3.  SKIN: No obvious rash, lesion, or ulcer.   LABORATORY PANEL:   CBC Recent Labs  Lab 10/01/18 1850  WBC 5.0  HGB 7.1*  HCT 22.7*  PLT 118*    ------------------------------------------------------------------------------------------------------------------  Chemistries  Recent Labs  Lab 10/01/18 1850  NA 137  K 3.4*  CL 106  CO2 23  GLUCOSE 179*  BUN 17  CREATININE 1.33*  CALCIUM 8.4*   ------------------------------------------------------------------------------------------------------------------  Cardiac Enzymes Recent Labs  Lab 10/01/18 1850  TROPONINI 0.65*   ------------------------------------------------------------------------------------------------------------------  RADIOLOGY:  Dg Chest 2 View  Result Date: 10/01/2018 CLINICAL DATA:  Chest pain. EXAM: CHEST - 2 VIEW COMPARISON:  Radiograph of June 02, 2016. FINDINGS:  The heart size and mediastinal contours are within normal limits. Status post transcatheter aortic valve replacement. Status post coronary artery bypass graft. No pneumothorax or pleural effusion is noted. Both lungs are clear. The visualized skeletal structures are unremarkable. IMPRESSION: No active cardiopulmonary disease. Aortic Atherosclerosis (ICD10-I70.0). Electronically Signed   By: Marijo Conception, M.D.   On: 10/01/2018 19:41    EKG:   Orders placed or performed during the hospital encounter of 10/01/18  . EKG 12-Lead  . EKG 12-Lead  . ED EKG within 10 minutes  . ED EKG within 10 minutes  . EKG 12-lead  . EKG 12-Lead    IMPRESSION AND PLAN:   #Non-STEMI with history of coronary artery disease-could be from demand ischemia Admit to telemetry Cycle troponins ED physician has discussed with patient's primary cardiologist Dr. Fletcher Anon who has not recommended heparin drip and recommended to transfuse blood and monitor patient closely. Patient is a symptomatic and chest pain-free during my examination Echocardiogram ordered We will continue Coreg, Lipitor, Imdur, lisinopril and hold aspirin and Plavix in view of GI bleed   #Symptomatic anemia In July patient's hemoglobin  was at 10.7 currently it is at 7.0 Transfuse 1 unit of blood and monitor hemoglobin hematocrit and transfuse more as needed GI consult placed  #GI bleed with fecal occult blood positive x1 in the emergency department Monitor hemoglobin and hematocrit and transfuse as needed GI consult placed  #Acute kidney injury-gentle hydration with IV fluids  #Hypokalemia-replete and recheck  #Obstructive sleep apnea continue CPAP nightly   #Essential hypertension-continue home medications Coreg, Imdur, lisinopril  #Hyperlipidemia-continue high intensity Lipitor    All the records are reviewed and case discussed with ED provider. Management plans discussed with the patient, family and they are in agreement.  CODE STATUS: fc   TOTAL TIME TAKING CARE OF THIS PATIENT: 43 minutes.   Note: This dictation was prepared with Dragon dictation along with smaller phrase technology. Any transcriptional errors that result from this process are unintentional.  Nicholes Mango M.D on 10/01/2018 at 9:43 PM  Between 7am to 6pm - Pager - [phone]  After 6pm go to [email or web address] - password [email or web address] [facility]  [facility] [facility]  Office  [phone]  CC: Primary care physician; Barry Dienes, NP

## 2018-10-01 NOTE — Progress Notes (Signed)
Patient has refused cpap therapy. He states that he does wear a cpap at home he has chosen not to use one here however.  I asked him to have RN to let us know if he changes his mind.

## 2018-10-02 ENCOUNTER — Inpatient Hospital Stay (HOSPITAL_COMMUNITY)
Admit: 2018-10-02 | Discharge: 2018-10-02 | Disposition: A | Discharge status: Home / Self Care | Payer: Medicare HMO | Attending: Internal Medicine | Admitting: Internal Medicine

## 2018-10-02 DIAGNOSIS — I5030 Unspecified diastolic (congestive) heart failure: Secondary | ICD-10-CM

## 2018-10-02 DIAGNOSIS — D500 Iron deficiency anemia secondary to blood loss (chronic): Secondary | ICD-10-CM

## 2018-10-02 DIAGNOSIS — I248 Other forms of acute ischemic heart disease: Secondary | ICD-10-CM

## 2018-10-02 DIAGNOSIS — R079 Chest pain, unspecified: Secondary | ICD-10-CM

## 2018-10-02 LAB — CBC
HCT: 23.4 % — ABNORMAL LOW (ref 39.0–52.0)
Hemoglobin: 7.5 g/dL — ABNORMAL LOW (ref 13.0–17.0)
MCH: 30.7 pg (ref 26.0–34.0)
MCHC: 32.1 g/dL (ref 30.0–36.0)
MCV: 95.9 fL (ref 80.0–100.0)
PLATELETS: 102 10*3/uL — AB (ref 150–400)
RBC: 2.44 MIL/uL — AB (ref 4.22–5.81)
RDW: 17.4 % — ABNORMAL HIGH (ref 11.5–15.5)
WBC: 4.5 10*3/uL (ref 4.0–10.5)
nRBC: 0 % (ref 0.0–0.2)

## 2018-10-02 LAB — ECHOCARDIOGRAM COMPLETE
HEIGHTINCHES: 69 in
Weight: 3064 oz

## 2018-10-02 LAB — TSH: TSH: 2.817 u[IU]/mL (ref 0.350–4.500)

## 2018-10-02 LAB — IRON AND TIBC
Iron: 26 ug/dL — ABNORMAL LOW (ref 45–182)
SATURATION RATIOS: 8 % — AB (ref 17.9–39.5)
TIBC: 312 ug/dL (ref 250–450)
UIBC: 286 ug/dL

## 2018-10-02 LAB — BASIC METABOLIC PANEL
Anion gap: 4 — ABNORMAL LOW (ref 5–15)
BUN: 15 mg/dL (ref 8–23)
CO2: 26 mmol/L (ref 22–32)
Calcium: 8.3 mg/dL — ABNORMAL LOW (ref 8.9–10.3)
Chloride: 112 mmol/L — ABNORMAL HIGH (ref 98–111)
Creatinine, Ser: 1.15 mg/dL (ref 0.61–1.24)
GFR calc Af Amer: >60 mL/min (ref >60)
GFR calc non Af Amer: 58 mL/min — ABNORMAL LOW (ref >60)
GLUCOSE: 144 mg/dL — AB (ref 70–99)
POTASSIUM: 3.6 mmol/L (ref 3.5–5.1)
Sodium: 142 mmol/L (ref 135–145)

## 2018-10-02 LAB — TROPONIN I
TROPONIN I: 0.82 ng/mL — AB (ref <0.03)
Troponin I: 0.91 ng/mL (ref <0.03)

## 2018-10-02 LAB — HEMOGLOBIN AND HEMATOCRIT, BLOOD
HEMATOCRIT: 22.6 % — AB (ref 39.0–52.0)
HEMOGLOBIN: 7.3 g/dL — AB (ref 13.0–17.0)

## 2018-10-02 LAB — RETICULOCYTES
Immature Retic Fract: 31.8 % — ABNORMAL HIGH (ref 2.3–15.9)
RBC.: 2.43 MIL/uL — ABNORMAL LOW (ref 4.22–5.81)
RETIC CT PCT: 7.2 % — AB (ref 0.4–3.1)
Retic Count, Absolute: 173.7 10*3/uL (ref 19.0–186.0)

## 2018-10-02 LAB — LIPID PANEL
CHOL/HDL RATIO: 4.1 ratio
Cholesterol: 122 mg/dL (ref 0–200)
HDL: 30 mg/dL — ABNORMAL LOW (ref >40)
LDL Cholesterol: 63 mg/dL (ref 0–99)
TRIGLYCERIDES: 147 mg/dL (ref <150)
VLDL: 29 mg/dL (ref 0–40)

## 2018-10-02 LAB — PREPARE RBC (CROSSMATCH)

## 2018-10-02 LAB — FOLATE: FOLATE: 13.4 ng/mL (ref >5.9)

## 2018-10-02 LAB — FERRITIN: Ferritin: 10 ng/mL — ABNORMAL LOW (ref 24–336)

## 2018-10-02 MED ORDER — POTASSIUM CHLORIDE CRYS ER 20 MEQ PO TBCR
20.0000 meq | EXTENDED_RELEASE_TABLET | Freq: Once | ORAL | Status: AC
  Administered 2018-10-02: 20 meq via ORAL
  Filled 2018-10-02: qty 1

## 2018-10-02 MED ORDER — PERFLUTREN LIPID MICROSPHERE
1.0000 mL | INTRAVENOUS | Status: AC | PRN
  Administered 2018-10-02: 2 mL via INTRAVENOUS
  Filled 2018-10-02: qty 10

## 2018-10-02 MED ORDER — SODIUM CHLORIDE 0.9% IV SOLUTION: Freq: Once | INTRAVENOUS | Status: DC

## 2018-10-02 MED ORDER — INFLUENZA VAC SPLIT HIGH-DOSE 0.5 ML IM SUSY
0.5000 mL | PREFILLED_SYRINGE | INTRAMUSCULAR | Status: AC
  Administered 2018-10-03: 0.5 mL via INTRAMUSCULAR
  Filled 2018-10-02: qty 0.5

## 2018-10-02 NOTE — Progress Notes (Signed)
[facility] NAME: Matthew [name]    MR#:  [id number]  DATE OF BIRTH:  12-20-1935  SUBJECTIVE:   Patient presented to the hospital secondary to chest pain and noted to have a mildly elevated troponin.  She was also noted to be anemic with heme positive stools and suspected to have a GI bleed.  Patient has been having melanotic stools for a while but is on iron supplements.  REVIEW OF SYSTEMS:    Review of Systems  Constitutional: Negative for chills and fever.  HENT: Negative for congestion and tinnitus.   Eyes: Negative for blurred vision and double vision.  Respiratory: Negative for cough, shortness of breath and wheezing.   Cardiovascular: Negative for chest pain, orthopnea and PND.  Gastrointestinal: Positive for melena. Negative for abdominal pain, diarrhea, nausea and vomiting.  Genitourinary: Negative for dysuria and hematuria.  Neurological: Negative for dizziness, sensory change and focal weakness.  All other systems reviewed and are negative.   Nutrition: Clear Liquids Tolerating Diet: Yes Tolerating PT: Await Eval.   DRUG ALLERGIES:   Allergies  Allergen Reactions  . Ranexa [Ranolazine Er] Rash    VITALS:  Blood pressure (!) 113/50, pulse 67, temperature 98.3 F (36.8 C), temperature source Oral, resp. rate 16, height 5\' 9"  (1.753 m), weight 86.9 kg, SpO2 96 %.  PHYSICAL EXAMINATION:   Physical Exam  GENERAL:  82 y.o.-year-old patient lying in bed in no acute distress.  EYES: Pupils equal, round, reactive to light and accommodation. No scleral icterus. Extraocular muscles intact. Pale Conjunctiva.  HEENT: Head atraumatic, normocephalic. Oropharynx and nasopharynx clear.  NECK:  Supple, no jugular venous distention. No thyroid enlargement, no tenderness.  LUNGS: Normal breath sounds bilaterally, no wheezing, rales, rhonchi. No use of accessory muscles of respiration.  CARDIOVASCULAR: S1, S2 normal. No  murmurs, rubs, or gallops.  ABDOMEN: Soft, nontender, nondistended. Bowel sounds present. No organomegaly or mass.  EXTREMITIES: No cyanosis, clubbing or edema b/l.    NEUROLOGIC: Cranial nerves II through XII are intact. No focal Motor or sensory deficits b/l.   PSYCHIATRIC: The patient is alert and oriented x 3.  SKIN: No obvious rash, lesion, or ulcer.    LABORATORY PANEL:   CBC Recent Labs  Lab 10/02/18 0847  WBC 4.5  HGB 7.5*  HCT 23.4*  PLT 102*   ------------------------------------------------------------------------------------------------------------------  Chemistries  Recent Labs  Lab 10/02/18 0847  NA 142  K 3.6  CL 112*  CO2 26  GLUCOSE 144*  BUN 15  CREATININE 1.15  CALCIUM 8.3*   ------------------------------------------------------------------------------------------------------------------  Cardiac Enzymes Recent Labs  Lab 10/02/18 0847  TROPONINI 0.82*   ------------------------------------------------------------------------------------------------------------------  RADIOLOGY:  Dg Chest 2 View  Result Date: 10/01/2018 CLINICAL DATA:  Chest pain. EXAM: CHEST - 2 VIEW COMPARISON:  Radiograph of June 02, 2016. FINDINGS: The heart size and mediastinal contours are within normal limits. Status post transcatheter aortic valve replacement. Status post coronary artery bypass graft. No pneumothorax or pleural effusion is noted. Both lungs are clear. The visualized skeletal structures are unremarkable. IMPRESSION: No active cardiopulmonary disease. Aortic Atherosclerosis (ICD10-I70.0). Electronically Signed   By: Marijo Conception, M.D.   On: 10/01/2018 19:41     ASSESSMENT AND PLAN:   82 year old male with past medical history of coronary artery disease status post bypass and stent placement, hypertension, hyperlipidemia, obstructive sleep apnea, GERD, aortic stenosis who presents to the hospital due to shortness of breath and chest pain.  1.   Shortness of breath/chest pain- secondary to demand ischemia from worsening anemia.  Patient had some mild troponin elevation but as per cardiology this is demand ischemia and not necessarily a non-ST elevation MI. -No plans for ischemic evaluation or cardiac catheterization presently.  Continue supportive care and will transfuse 1 more unit of packed red blood cells and follow hemoglobin.  Appreciate cardiology input. -Continue to hold aspirin, Plavix given the anemia.  2.  GI bleed-suspected to be upper GI bleed given the patient's melanotic stools. - Continue to hold aspirin, Plavix.  Will transfuse 1 more unit of packed red blood cells today.  Follow hemoglobin.  Discussed with gastroenterology and plan for possible endoscopy once Plavix is been stopped for 5 days.  Placed on clear liquid diet today.  3.  Acute blood loss anemia-secondary to the GI bleed. - Patient received 1 unit of packed red blood cells and hemoglobin slightly improved but still below 8.  Will transfuse 1 more unit of packed red blood cells.  Follow hemoglobin.  Check iron studies.  4.  History of coronary artery disease status post bypass- patient's chest pain or shortness of breath there is secondary to demand ischemia without any evidence of acute coronary syndrome.  Continue atorvastatin, carvedilol.  Hold aspirin, Plavix for now.  5.  Essential hypertension-continue Isordil, carvedilol, lisinopril.  6.  BPH-continue Flomax.  7.  Hyperlipidemia-continue atorvastatin.    All the records are reviewed and case discussed with Care Management/Social Worker. Management plans discussed with the patient, family and they are in agreement.  CODE STATUS: Full code  DVT Prophylaxis: Ted's & SCD's.   TOTAL TIME TAKING CARE OF THIS PATIENT: 35 minutes.   POSSIBLE D/C IN 3-4 DAYS, DEPENDING ON CLINICAL CONDITION.   Henreitta Leber M.D on 10/02/2018 at 2:46 PM  Between 7am to 6pm - Pager - [phone]  After 6pm go to  [email or web address] - Proofreader  [facility]  Office  [phone]  CC: Primary care physician; Barry Dienes, NP

## 2018-10-02 NOTE — Progress Notes (Signed)
*  PRELIMINARY RESULTS* Echocardiogram 2D Echocardiogram has been performed. Definity image enhancer was administered.  Matthew [name] 10/02/2018, 10:18 AM

## 2018-10-02 NOTE — Consult Note (Addendum)
Matthew [name] , MD [address], [place], [place], [place], [id number], [place], [place], [place], [address] Phone: [phone]  Fax: [phone]  Consultation  Referring Provider: Dr Verdell Carmine Primary Care Physician:  Barry Dienes, NP Primary Gastroenterologist:  None          Reason for Consultation:     Anemia/GI bleed  Date of Admission:  10/01/2018 Date of Consultation:  10/02/2018         HPI:   Matthew [name] is a 82 y.o. male who is known to have aortic stenosis, extensive CAD.  Presented to the emergency with chest pain.  History of shortness of breath on minimal exertion and black-colored stools for a few days.  Hemoglobin dropped to 7 g found to have elevated troponins and admitted.  Hemoglobin 3 months back was 10.6 g with an MCV of 82.8.  Blood count has been low normal at 118.  Creatinine is 1.33 on admission.  No iron studies performed on admission. She has been on Plavix, aspirin, iron tablets.  He says that his stool has been black for a few months, been on iron and has GI appointment at [facility] in November .  Denies any NSAID use. Last dose of Plavix was on Sunday . Says last colonosocpy was 5-6 years back and was normal.   Past Medical History:  Diagnosis Date  . AS (aortic stenosis)    a. Echo 6/10: EF 55% mild AS; b. echo 06/2015; EF 55-60%, GR1DD, moderate AS, Peak velocity (S): 346 cm/s. Mean gradientS): 28 mm Hg. Peak gradient (S): 48 mm Hg. Valve area (VTI): 1.18 cm2;   c. Echo 4/17 - mild LVH, EF 55-60%, no RWMA, Gr 1 DD, mod to severe AS (mean 28 mmHg, peak 46 mmHg), mild LAE  . CAD (coronary artery disease)    a. MI 1996 w/ CABG 1996; b.redo CABG 06/2003; c. Myoview 7/09: EF 54% inferobasal infarct, no ischemia. Myoview 6/10 EF 43% inf wall infarct. no ischemia; c. cath 8/16 s/p DES to VG-OM2. OTW 3VD w/ patent VG->RPDA, VG->OM3,  & LIMA->LAD.  EF 55-65%.  . Carotid bruit    2009 0-39% on dopplers bilatrally  . GERD (gastroesophageal  reflux disease)   . History of kidney stones   . HOH (hard of hearing)   . HTN (hypertension)   . Hyperlipidemia   . Myocardial infarction ([facility]) 1996  . Nephrolithiasis   . OSA (obstructive sleep apnea)   . PAD (peripheral artery disease) ([facility]) 02/2014   Subtotal occlusion of right common iliac artery and 70% stenosis in the left common iliac artery. Status post bilateral kissing stent placement. 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 Shortness of breath    with exertion  . Thrombocytopenia ([facility])     Past Surgical History:  Procedure Laterality Date  . ABDOMINAL AORTAGRAM N/A 03/06/2014   Procedure: ABDOMINAL Maxcine Ham;  Surgeon: Wellington Hampshire, MD;  Location: [facility];  Service: Cardiovascular;  Laterality: N/A;  . CARDIAC CATHETERIZATION  02/2014   Severe three-vessel coronary artery disease with patent grafts.   Marland Kitchen CARDIAC CATHETERIZATION N/A 07/31/2015   Procedure: Right/Left Heart Cath and Coronary/Graft Angiography;  Surgeon: Wellington Hampshire, MD;  Location: [facility];  Service: Cardiovascular;  Laterality: N/A;  . CARDIAC CATHETERIZATION N/A 07/31/2015   Procedure: Coronary  Stent Intervention;  Surgeon: Wellington Hampshire, MD;  Location: [facility];  Service: Cardiovascular;  Laterality: N/A;  . CARDIAC CATHETERIZATION N/A 05/26/2016   Procedure: Right/Left Heart Cath and Coronary Angiography;  Surgeon: Wellington Hampshire, MD;  Location: [facility];  Service: Cardiovascular;  Laterality: N/A;  . CORONARY ARTERY BYPASS GRAFT  1991   at [facility]. Redone 2004-3 vessels 1st time and 4 second time  . CYSTO     2/3 times for kidney stones  . INGUINAL HERNIA REPAIR  11/10/2011   Procedure: HERNIA REPAIR INGUINAL ADULT;  Surgeon: Donato Heinz;  Location: [facility];  Service: General;  Laterality:  Right;  . LEFT HEART CATH AND CORONARY ANGIOGRAPHY Left 05/15/2018   Procedure: LEFT HEART CATH AND CORONARY ANGIOGRAPHY;  Surgeon: Wellington Hampshire, MD;  Location: [facility];  Service: Cardiovascular;  Laterality: Left;  . LEFT HEART CATHETERIZATION WITH CORONARY/GRAFT ANGIOGRAM N/A 03/06/2014   Procedure: LEFT HEART CATHETERIZATION WITH Beatrix Fetters;  Surgeon: Wellington Hampshire, MD;  Location: [facility];  Service: Cardiovascular;  Laterality: N/A;  . STOMACH SURGERY     removal of gastric ulcers  . TEE WITHOUT CARDIOVERSION N/A 06/01/2016   Procedure: TRANSESOPHAGEAL ECHOCARDIOGRAM (TEE);  Surgeon: Sherren Mocha, MD;  Location: [facility];  Service: Open Heart Surgery;  Laterality: N/A;  . TRANSCATHETER AORTIC VALVE REPLACEMENT, TRANSFEMORAL N/A 06/01/2016   Procedure: TRANSCATHETER AORTIC VALVE REPLACEMENT, TRANSFEMORAL;  Surgeon: Sherren Mocha, MD;  Location: [facility];  Service: Open Heart Surgery;  Laterality: N/A;    Prior to Admission medications   Medication Sig Start Date End Date Taking? Authorizing Provider  acetaminophen (TYLENOL) 325 MG tablet Take 650 mg by mouth daily as needed for mild pain or headache.     [provider]  aspirin EC 81 MG tablet Take 1 tablet (81 mg total) by mouth daily. 03/22/14   Wellington Hampshire, MD  atorvastatin (LIPITOR) 80 MG tablet Take 1 tablet (80 mg total) by mouth daily. 05/23/18   Wellington Hampshire, MD  carvedilol (COREG) 3.125 MG tablet Take 1 tablet (3.125 mg total) by mouth 2 (two) times daily. 07/05/18   Rise Mu, PA-C  clopidogrel (PLAVIX) 75 MG tablet Take 1 tablet (75 mg total) by mouth daily with breakfast. 05/23/18   Wellington Hampshire, MD  ferrous sulfate 325 (65 FE) MG tablet Take 325 mg by mouth. Takes 3-4 times daily.    [provider]  gabapentin (NEURONTIN) 100 MG capsule Take 1-3 capsules (100-300 mg total) by mouth 4 (four) times daily. Follow written titration schedule. 05/15/18 08/21/18  Vevelyn Francois, NP  GARLIC PO Take by mouth daily.    [provider]  isosorbide dinitrate (ISORDIL) 10 MG tablet Take 2 tablets (20 mg total) by mouth every morning. Take 10 mg by mouth in the afternoon Patient taking differently: Take 10 mg by mouth 3 (three) times daily. Take 10 mg by mouth in the afternoon  04/11/18   Rise Mu, PA-C  lisinopril (PRINIVIL,ZESTRIL) 2.5 MG tablet Take 1 tablet (2.5 mg total) by mouth daily. 08/21/18 11/19/18  Rise Mu, PA-C  Multiple Vitamins-Minerals (PRESERVISION AREDS PO) Take 1 tablet by mouth daily.     [provider]  nitroGLYCERIN (NITROSTAT) 0.4 MG SL tablet Place 0.4 mg under the tongue every 5 (five) minutes as needed for chest pain.    [provider]  Omega-3 Fatty Acids (FISH OIL PO) Take by mouth  daily.    [provider]  pantoprazole (PROTONIX) 40 MG tablet Take 1 tablet (40 mg total) by mouth daily. 05/31/18   Dunn, Areta Haber, PA-C  tamsulosin (FLOMAX) 0.4 MG CAPS capsule Take 1 capsule (0.4 mg total) by mouth daily. 06/21/18   Dunn, Areta Haber, PA-C  traMADol (ULTRAM) 50 MG tablet Take 1 tablet (50 mg total) daily by mouth. Patient taking differently: Take 50 mg by mouth daily as needed.  12/12/17 08/21/18  Milinda Pointer, MD  TURMERIC PO Take at bedtime by mouth.    [provider]    Family History  Problem Relation Age of Onset  . Heart disease Father   . Heart disease Mother      Social History   Tobacco Use  . Smoking status: Former Smoker    Packs/day: 1.00    Years: 50.00    Pack years: 50.00    Types: Cigarettes    Last attempt to quit: 01/24/1990    Years since quitting: 28.7  . Smokeless tobacco: Never Used  Substance Use Topics  . Alcohol use: No    Alcohol/week: 0.0 standard drinks    Comment: Alcoholic  . Drug use: No    Allergies as of 10/01/2018 - Review Complete 10/01/2018  Allergen Reaction Noted  . Ranexa [ranolazine er] Rash 07/19/2018    Review of Systems:    All systems  reviewed and negative except where noted in HPI.   Physical Exam:  Vital signs in last 24 hours: Temp:  [97.7 F (36.5 C)-98.8 F (37.1 C)] 98.5 F (36.9 C) (10/21 0831) Pulse Rate:  [61-87] 61 (10/21 0831) Resp:  [16-20] 16 (10/21 0831) BP: (118-148)/(35-55) 119/35 (10/21 0831) SpO2:  [96 %-100 %] 97 % (10/21 0831) Weight:  [86.2 kg-86.9 kg] 86.9 kg (10/20 2121) Last BM Date: 10/01/18 General:   Pleasant, cooperative in NAD Head:  Normocephalic and atraumatic. Eyes:   No icterus.   Conjunctiva pink. PERRLA. Ears:  Normal auditory acuity. Neck:  Supple; no masses or thyroidomegaly Lungs: Respirations even and unlabored. Lungs clear to auscultation bilaterally.   No wheezes, crackles, or rhonchi.  Heart:  Regular rate and rhythm;  Systolic murmur in aortic area, no clicks, rubs or gallops Abdomen:  Soft, nondistended, nontender. Normal bowel sounds. No appreciable masses or hepatomegaly.  No rebound or guarding.  Neurologic:  Alert and oriented x3;  grossly normal neurologically. Skin:  Intact without significant lesions or rashes. Cervical Nodes:  No significant cervical adenopathy. Psych:  Alert and cooperative. Normal affect.  LAB RESULTS: Recent Labs    10/01/18 1850 10/02/18 0247 10/02/18 0847  WBC 5.0  --  4.5  HGB 7.1* 7.3* 7.5*  HCT 22.7* 22.6* 23.4*  PLT 118*  --  102*   BMET Recent Labs    10/01/18 1850 10/02/18 0847  NA 137 142  K 3.4* 3.6  CL 106 112*  CO2 23 26  GLUCOSE 179* 144*  BUN 17 15  CREATININE 1.33* 1.15  CALCIUM 8.4* 8.3*   LFT No results for input(s): PROT, ALBUMIN, AST, ALT, ALKPHOS, BILITOT, BILIDIR, IBILI in the last 72 hours. PT/INR No results for input(s): LABPROT, INR in the last 72 hours.  STUDIES: Dg Chest 2 View  Result Date: 10/01/2018 CLINICAL DATA:  Chest pain. EXAM: CHEST - 2 VIEW COMPARISON:  Radiograph of June 02, 2016. FINDINGS: The heart size and mediastinal contours are within normal limits. Status post  transcatheter aortic valve replacement. Status post coronary artery bypass graft. No  pneumothorax or pleural effusion is noted. Both lungs are clear. The visualized skeletal structures are unremarkable. IMPRESSION: No active cardiopulmonary disease. Aortic Atherosclerosis (ICD10-I70.0). Electronically Signed   By: Marijo Conception, M.D.   On: 10/01/2018 19:41      Impression / Plan:   Matthew [name] is a 82 y.o. y/o male admitted with chest pain, acute drop in hemoglobin to 7 g.  History of aortic stenosis and melena.  He also has elevated troponin.  I do note a repeat echocardiogram has been performed.  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  The final treatment for which would be to treat the aortic stenosis and the literature shows that treatment of the aortic stenosis usually resolves the anemia/ bleeding.  I would still complete further evaluation of his GI tract which would include an upper endoscopy and colonoscopy and if both are negative he would require a capsule study of the small bowel.  I have discussed with Dr. Verdell Carmine for the procedures he would need to be off Plavix for 5 days, would need cardiac clearance .   Patient and family prefer the procedures to be done inpatient- based on him holding 5 doses of Plavix- EGD+colonoscopy will be scheduled for Saturday. Once cleared by cardiology , IV iron can be given and monitor CBC. PPI    Thank you for involving me in the care of this patient.      LOS: 1 day   Matthew Bellows, MD  10/02/2018, 12:00 PM

## 2018-10-02 NOTE — Consult Note (Signed)
Cardiology Consultation:   Patient ID: Matthew [name]; [id number]; [birth year]   Admit date: 10/01/2018 Date of Consult: 10/02/2018  Primary Care Provider: Barry Dienes, NP Primary Cardiologist: Fletcher Anon   Patient Profile:   Matthew [name] is a 82 y.o. male with a hx of CAD s/p CABG in 1991 with redo bypass in 2004, aortic stenosis s/p TAVR in 06/16,CBSWHQP combined systolic and diastolic CHF, stable angina, PAD with bilateral common iliac artery kissing stenting in 2015, HTN,anemia,OSA, and GERD who is being seen today for the evaluation of elevated troponin at the request of Dr. Margaretmary Eddy, MD.  History of Present Illness:   Matthew [name] underwent R/LHC in 05/2016, prior to his TAVR, showed severe underlying 3-vessel CAD with patent grafts (proximal LAD 100% stenosed, mid LCx 100% stenosed, OM2 80% stenosed, proximal RCA 100% stenosed, LIMA-LAD was unable to be cannulated and was anatomically normal and normal in caliber, RIMA-D2 was visualized by non-selective angiography and was anatomically normal, VG-OM2 with a patent, previously placed proximal graft stent, 50% mid VG-OM2 stenosis, VG-OM3 minimal luminal irregularities, VG-RPDA mild diffuse disease), RHC showed high normal pulmonary pressure and filling pressure, normal cardiac output. Continued medical management was advised. At his follow up with Dr. Fletcher Anon in 05/2017 he was doing well, though continued to note intermittent episodes of substernal chest tightness when he was late with taking his medications. TTE from 05/2017 showed an EF of 45-50%, mild concentric LVH, Gr1DD, septal dyssnergy, TAVR prosthesis was noted and functioning normally, moderately to severely dilated left atrium, mildly dilated right atrium. When compared to prior TTE from 06/2016 his EF had dropped from 55% and the LV was more dilated.He was seen in the office on 04/11/2018 for routine follow-up with a weight of 192 pounds, (which was stable when compared to his weight  from 05/13/2017).Hecontinuedto note intermittent chest pains if hewas late taking his medications.There was also some exertional chest pain. He underwent Lexiscan Myoview on 04/14/2018 that showed a medium defect of moderate severity present in the basal inferior septal location with findings consistent with prior MI, EF 36%, left bundle branch block was noted, this was an intermediate risk study. Follow-up echocardiogram on 05/03/2018 showed further reduction of his EF to 30 to 35%, diffuse hypokinesis, grade 2 diastolic dysfunction, bioprosthetic aortic valve was noted with a mean gradient of 14 mmHg, mildly dilated aortic root, mild MR, moderately dilated left atrium, RV systolic function was normal, PASP was normal. Case was discussed with his primary cardiologist who recommended optimization of heart failure medications. Patient was transitioned from metoprolol to carvedilol. In follow up on 5/31, he continued to note a functional decline with associated exertional chest pain. BP precluded further escalation of medications. Patient's family requested LHC. He underwent LHC on 05/15/2018 that showed significant underlying 3-vessel CAD with a patent LIMA-LAD, patent RIMA-diagonal, patent SVG-OM3 and patent SVG-rPDA. The SVG-OM2 was noted to be occluded which was a new finding; however the OM2 was receiving retrograde flow from the SVG-OM3. Medical management was advised without need for further coronary revascularization. There was also noted to be possible significant left subclavian artery stenosis, though no gradient was noted with pullback. He was noted to have frequent PVCs before and during the cath. Carotid artery ultrasound on 05/26/18, performed for the noted possibly left subclavian artery stenosis above, showed 1-39% bilateral ICA stenosis with normal flow dynamics in the bilateral subclavian arteries. Holter monitor from 05/2018 showed 5,600 PVCs representing 6% burden with occasional PACs and an  average heart rate of 62 bpm. His Coreg was increased to 6.25 mg bid along with holding of his HCTZ. In follow up in 06/2018, it was noted at that time he had continue to take both carvedilol and metoprolol despite [place] discontinuing metoprolol earlier in 2019. He had also been taking Isordil 3 times daily rather than twice daily. Following discontinuation of metoprolol his blood pressure improved into the 1 teens to 782N systolic. This allowed for escalation of carvedilol to 6.25 mg twice daily on 7/15. Labs checked by patient's PCP on 7/15 showed a slight decrease in his hemoglobin to 10.5 with a baseline of approximately 11-12. Patient follow-up with his PCP on 7/19 with noted continued drop in his hemoglobin to 9.8. Patient was sent to the [facility] ED on 7/22 because of this. Per ED note, patient's stool and urine were both checked for blood with both results being negative. Outpatient follow up was advised. He was started on PO iron. CBC checked by cardiology on 7/23 showed an improved HGB of 10.7. He was most recently seen in our office on 9/9 and was doing well from a cardiac perspective. He had been advised to stop Ranexa secondary to a rash.   Since starting PO iron, he has noted darker stools. In this setting, he had done well up until about 10-12 days prior when he became more fatigued with associated SOB and chest tightness with ambulation. He would take an ASA and rest with improvement in symptoms. He had noted dark stools, though attributed this to his iron. Weight has been stable. No BRBPR.   Upon the patient's arrival to [facility] they were found to have stable BP/HR, oxygen saturation 100% on room air, weight 86.2 kg. EKG showed NSR, LBBB (old), PVCs, CXR showed no acute process. Labs showed HGB down to 7.1-->7.3 s/p 1 unit pRBC, PLT 118, WBC 5.0, troponin 0.65-->0.91. Currently, without chest pain.    Past Medical History:  Diagnosis Date  . AS (aortic stenosis)    a. Echo 6/10: EF 55% mild AS; b.  echo 06/2015; EF 55-60%, GR1DD, moderate AS, Peak velocity (S): 346 cm/s. Mean gradientS): 28 mm Hg. Peak gradient (S): 48 mm Hg. Valve area (VTI): 1.18 cm2;   c. Echo 4/17 - mild LVH, EF 55-60%, no RWMA, Gr 1 DD, mod to severe AS (mean 28 mmHg, peak 46 mmHg), mild LAE  . CAD (coronary artery disease)    a. MI 1996 w/ CABG 1996; b.redo CABG 06/2003; c. Myoview 7/09: EF 54% inferobasal infarct, no ischemia. Myoview 6/10 EF 43% inf wall infarct. no ischemia; c. cath 8/16 s/p DES to VG-OM2. OTW 3VD w/ patent VG->RPDA, VG->OM3,  & LIMA->LAD.  EF 55-65%.  . Carotid bruit    2009 0-39% on dopplers bilatrally  . GERD (gastroesophageal reflux disease)   . History of kidney stones   . HOH (hard of hearing)   . HTN (hypertension)   . Hyperlipidemia   . Myocardial infarction ([place]) 1996  . Nephrolithiasis   . OSA (obstructive sleep apnea)   . PAD (peripheral artery disease) ([place]) 02/2014   Subtotal occlusion of right common iliac artery and 70% stenosis in the left common iliac artery. Status post bilateral kissing stent placement. 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 Shortness of breath    with exertion  . Thrombocytopenia ([place])  Past Surgical History:  Procedure Laterality Date  . ABDOMINAL AORTAGRAM N/A 03/06/2014   Procedure: ABDOMINAL Maxcine Ham;  Surgeon: Wellington Hampshire, MD;  Location: [place] CATH LAB;  Service: Cardiovascular;  Laterality: N/A;  . CARDIAC CATHETERIZATION  02/2014   Severe three-vessel coronary artery disease with patent grafts.   Marland Kitchen CARDIAC CATHETERIZATION N/A 07/31/2015   Procedure: Right/Left Heart Cath and Coronary/Graft Angiography;  Surgeon: Wellington Hampshire, MD;  Location: [place] CV LAB;  Service: Cardiovascular;  Laterality: N/A;  . CARDIAC CATHETERIZATION N/A 07/31/2015   Procedure: Coronary Stent  Intervention;  Surgeon: Wellington Hampshire, MD;  Location: [place] CV LAB;  Service: Cardiovascular;  Laterality: N/A;  . CARDIAC CATHETERIZATION N/A 05/26/2016   Procedure: Right/Left Heart Cath and Coronary Angiography;  Surgeon: Wellington Hampshire, MD;  Location: [place] CV LAB;  Service: Cardiovascular;  Laterality: N/A;  . CORONARY ARTERY BYPASS GRAFT  1991   at [facility]. Redone 2004-3 vessels 1st time and 4 second time  . CYSTO     2/3 times for kidney stones  . INGUINAL HERNIA REPAIR  11/10/2011   Procedure: HERNIA REPAIR INGUINAL ADULT;  Surgeon: Donato Heinz;  Location: [facility];  Service: General;  Laterality: Right;  . LEFT HEART CATH AND CORONARY ANGIOGRAPHY Left 05/15/2018   Procedure: LEFT HEART CATH AND CORONARY ANGIOGRAPHY;  Surgeon: Wellington Hampshire, MD;  Location: [place] CV LAB;  Service: Cardiovascular;  Laterality: Left;  . LEFT HEART CATHETERIZATION WITH CORONARY/GRAFT ANGIOGRAM N/A 03/06/2014   Procedure: LEFT HEART CATHETERIZATION WITH Beatrix Fetters;  Surgeon: Wellington Hampshire, MD;  Location: [place] CATH LAB;  Service: Cardiovascular;  Laterality: N/A;  . STOMACH SURGERY     removal of gastric ulcers  . TEE WITHOUT CARDIOVERSION N/A 06/01/2016   Procedure: TRANSESOPHAGEAL ECHOCARDIOGRAM (TEE);  Surgeon: Sherren Mocha, MD;  Location: [place];  Service: Open Heart Surgery;  Laterality: N/A;  . TRANSCATHETER AORTIC VALVE REPLACEMENT, TRANSFEMORAL N/A 06/01/2016   Procedure: TRANSCATHETER AORTIC VALVE REPLACEMENT, TRANSFEMORAL;  Surgeon: Sherren Mocha, MD;  Location: [place];  Service: Open Heart Surgery;  Laterality: N/A;     Home Meds: Prior to Admission medications   Medication Sig Start Date End Date Taking? Authorizing Provider  acetaminophen (TYLENOL) 325 MG tablet Take 650 mg by mouth daily as needed for mild pain or headache.     [provider]  aspirin EC 81 MG tablet Take 1 tablet (81 mg total) by mouth daily. 03/22/14   Wellington Hampshire, MD    atorvastatin (LIPITOR) 80 MG tablet Take 1 tablet (80 mg total) by mouth daily. 05/23/18   Wellington Hampshire, MD  carvedilol (COREG) 3.125 MG tablet Take 1 tablet (3.125 mg total) by mouth 2 (two) times daily. 07/05/18   Rise Mu, PA-C  clopidogrel (PLAVIX) 75 MG tablet Take 1 tablet (75 mg total) by mouth daily with breakfast. 05/23/18   Wellington Hampshire, MD  ferrous sulfate 325 (65 FE) MG tablet Take 325 mg by mouth. Takes 3-4 times daily.    [provider]  gabapentin (NEURONTIN) 100 MG capsule Take 1-3 capsules (100-300 mg total) by mouth 4 (four) times daily. Follow written titration schedule. 05/15/18 08/21/18  Vevelyn Francois, NP  GARLIC PO Take by mouth daily.    [provider]  isosorbide dinitrate (ISORDIL) 10 MG tablet Take 2 tablets (20 mg total) by mouth every morning. Take 10 mg by mouth in the afternoon Patient taking differently: Take 10 mg by mouth  3 (three) times daily. Take 10 mg by mouth in the afternoon  04/11/18   Rise Mu, PA-C  lisinopril (PRINIVIL,ZESTRIL) 2.5 MG tablet Take 1 tablet (2.5 mg total) by mouth daily. 08/21/18 11/19/18  Rise Mu, PA-C  Multiple Vitamins-Minerals (PRESERVISION AREDS PO) Take 1 tablet by mouth daily.     [provider]  nitroGLYCERIN (NITROSTAT) 0.4 MG SL tablet Place 0.4 mg under the tongue every 5 (five) minutes as needed for chest pain.    [provider]  Omega-3 Fatty Acids (FISH OIL PO) Take by mouth daily.    [provider]  pantoprazole (PROTONIX) 40 MG tablet Take 1 tablet (40 mg total) by mouth daily. 05/31/18   Thandiwe Siragusa, Areta Haber, PA-C  tamsulosin (FLOMAX) 0.4 MG CAPS capsule Take 1 capsule (0.4 mg total) by mouth daily. 06/21/18   Annaly Skop, Areta Haber, PA-C  traMADol (ULTRAM) 50 MG tablet Take 1 tablet (50 mg total) daily by mouth. Patient taking differently: Take 50 mg by mouth daily as needed.  12/12/17 08/21/18  Milinda Pointer, MD  TURMERIC PO Take at bedtime by mouth.    [provider]    Inpatient Medications: Scheduled Meds: . atorvastatin  80 mg Oral Daily  . carvedilol  3.125 mg Oral BID  . ferrous sulfate  325 mg Oral Q breakfast  . Influenza vac split quadrivalent PF  0.5 mL Intramuscular Tomorrow-1000  . isosorbide dinitrate  20 mg Oral [id number]  . lisinopril  2.5 mg Oral Daily  . ramelteon  8 mg Oral QHS  . tamsulosin  0.4 mg Oral Daily   Continuous Infusions: . sodium chloride    . sodium chloride 75 mL/hr at 10/01/18 2153   PRN Meds: acetaminophen, nitroGLYCERIN, ondansetron (ZOFRAN) IV  Allergies:   Allergies  Allergen Reactions  . Ranexa [Ranolazine Er] Rash    Social History:   Social History   Socioeconomic History  . Marital status: Married    Spouse name: Not on file  . Number of children: 2  . Years of education: Not on file  . Highest education level: Not on file  Occupational History  . Occupation: Retired  Scientific laboratory technician  . Financial resource strain: Not on file  . Food insecurity:    Worry: Not on file    Inability: Not on file  . Transportation needs:    Medical: Not on file    Non-medical: Not on file  Tobacco Use  . Smoking status: Former Smoker    Packs/day: 1.00    Years: 50.00    Pack years: 50.00    Types: Cigarettes    Last attempt to quit: 01/24/1990    Years since quitting: 28.7  . Smokeless tobacco: Never Used  Substance and Sexual Activity  . Alcohol use: No    Alcohol/week: 0.0 standard drinks    Comment: Alcoholic  . Drug use: No  . Sexual activity: Never  Lifestyle  . Physical activity:    Days per week: Not on file    Minutes per session: Not on file  . Stress: Not on file  Relationships  . Social connections:    Talks on phone: Not on file    Gets together: Not on file    Attends religious service: Not on file    Active member of club or organization: Not on file    Attends meetings of clubs or organizations: Not on file    Relationship status: Not on file  . Intimate partner  violence:     Fear of current or ex partner: Not on file    Emotionally abused: Not on file    Physically abused: Not on file    Forced sexual activity: Not on file  Other Topics Concern  . Not on file  Social History Narrative   Married an lives with wife in [place]. Retired form [employer].      Family History:  Family History  Problem Relation Age of Onset  . Heart disease Father   . Heart disease Mother     ROS:  Review of Systems  Constitutional: Positive for malaise/fatigue. Negative for chills, diaphoresis, fever and weight loss.  HENT: Negative for congestion.   Eyes: Negative for discharge and redness.  Respiratory: Positive for shortness of breath. Negative for cough, hemoptysis, sputum production and wheezing.   Cardiovascular: Positive for chest pain. Negative for palpitations, orthopnea, claudication, leg swelling and PND.  Gastrointestinal: Positive for melena. Negative for abdominal pain, blood in stool, constipation, diarrhea, heartburn, nausea and vomiting.  Genitourinary: Negative for hematuria.  Musculoskeletal: Negative for falls and myalgias.  Skin: Negative for rash.  Neurological: Positive for weakness. Negative for dizziness, tingling, tremors, sensory change, speech change, focal weakness and loss of consciousness.  Endo/Heme/Allergies: Does not bruise/bleed easily.  Psychiatric/Behavioral: Negative for substance abuse. The patient is not nervous/anxious.   All other systems reviewed and are negative.     Physical Exam/Data:   Vitals:   10/01/18 2121 10/01/18 2154 10/01/18 2348 10/02/18 0357  BP: (!) 148/55 (!) 137/53 (!) 128/52 (!) 118/49  Pulse: 65 61 61 61  Resp: 20   18  Temp: 98.5 F (36.9 C) 98.2 F (36.8 C) 98.6 F (37 C) 98.8 F (37.1 C)  TempSrc: Oral Oral Oral Oral  SpO2: 100% 100% 99% 96%  Weight: 86.9 kg     Height: 5\' 9"  (1.753 m)       Intake/Output Summary (Last 24 hours) at 10/02/2018 0719 Last data filed at 10/02/2018  0700 Gross per 24 hour  Intake 1037.31 ml  Output 400 ml  Net 637.31 ml   Filed Weights   10/01/18 1849 10/01/18 2121  Weight: 86.2 kg 86.9 kg   Body mass index is 28.28 kg/m.   Physical Exam: General: Well developed, well nourished, in no acute distress. Head: Normocephalic, atraumatic, sclera non-icteric, no xanthomas, nares without discharge.  Neck: Negative for carotid bruits. JVD not elevated. Lungs: Clear bilaterally to auscultation without wheezes, rales, or rhonchi. Breathing is unlabored. Heart: RRR with S1 S2. II/VI systolic murmur RUSB, no rubs, or gallops appreciated. Abdomen: Soft, non-tender, non-distended with normoactive bowel sounds. No hepatomegaly. No rebound/guarding. No obvious abdominal masses. Msk:  Strength and tone appear normal for age. Extremities: No clubbing or cyanosis. No edema. Distal pedal pulses are 2+ and equal bilaterally. Neuro: Alert and oriented X 3. No facial asymmetry. No focal deficit. Moves all extremities spontaneously. Psych:  Responds to questions appropriately with a normal affect.   EKG:  The EKG was personally reviewed and demonstrates: NSR, 84 bpm, LBBB (old), PVCs Telemetry:  Telemetry was personally reviewed and demonstrates: NSR, PVCs, BBB  Weights: Filed Weights   10/01/18 1849 10/01/18 2121  Weight: 86.2 kg 86.9 kg    Relevant CV Studies: LHC 05/15/2018: Diagnostic  Dominance: Right  Left Anterior Descending  Prox LAD lesion 100% stenosed  Prox LAD lesion is 100% stenosed.  Left Circumflex  Ost Cx lesion 30% stenosed  Ost Cx lesion is 30% stenosed.  Colon Flattery  Cx to Prox Cx lesion 100% stenosed  Ost Cx to Prox Cx lesion is 100% stenosed.  Mid Cx lesion 60% stenosed  Mid Cx lesion is 60% stenosed.  Second Obtuse Marginal Branch  2nd Mrg lesion 80% stenosed  2nd Mrg lesion is 80% stenosed.  Right Coronary Artery  Prox RCA lesion 100% stenosed  Prox RCA lesion is 100% stenosed.  single graft Graft to 3rd Mrg  and is  normal in caliber. The graft exhibits minimal luminal irregularities.  Graft to 2nd Mrg  and is normal in caliber. The graft exhibits severe focal disease.  Origin to Prox Graft lesion 100% stenosed  Origin to Prox Graft lesion is 100% stenosed.  LIMA LIMA Graft to Mid LAD  LIMA due to inability to cannulate and is normal in caliber. The graft exhibits no disease.  RIMA Graft to 2nd Diag  Graft to Ost RPDA  Origin lesion 40% stenosed  Origin lesion is 40% stenosed.  Intervention   No interventions have been documented.  Coronary Diagrams   Diagnostic Diagram        1.  Significant underlying three-vessel coronary artery disease with patent LIMA to LAD, patent RIMA to diagonal, patent SVG to OM 3 and patent SVG to right PDA.  The SVG to OM 2 is now occluded which is a new finding.  However, OM2 gets retrograde flow from SVG to OM 3. 2.  Possible significant left subclavian artery stenosis although no gradient was noted with pullback.  Recommendations: Overall difficult procedure due to significant tortuosity of the innominate artery and left subclavian artery.  No coronary revascularization is needed.  We have to investigate the significance of left subclavian artery stenosis which might be causing decreased flow into the LIMA to LAD.  Recommend carotid Doppler with focus on the left subclavian artery.  Endovascular intervention of the left subclavian artery is not straightforward due to tortuosity and also close origin to the LIMA. The patient is noted to have frequent PVCs before and throughout the cath.  This might be contributing to his cardiomyopathy.  Recommend a 24-hour Holter monitor. __________  Echo 05/03/2018: Study Conclusions  - Left ventricle: The cavity size was normal. Systolic function was   moderately to severely reduced. The estimated ejection fraction   was in the range of 30% to 35%. Diffuse hypokinesis. Regional   wall motion abnormalities cannot be  excluded. Features are   consistent with a pseudonormal left ventricular filling pattern,   with concomitant abnormal relaxation and increased filling   pressure (grade 2 diastolic dysfunction). - Aortic valve: A bioprosthesis was present. s/p TAVR Peak velocity   (S): 250 cm/s. Mean gradient (S): 14 mm Hg. - Aortic root: The aortic root was mildly dilated. - Mitral valve: There was mild regurgitation. - Left atrium: The atrium was moderately dilated. - Right ventricle: Systolic function was normal. - Pulmonary arteries: Systolic pressure was within the normal   range. __________  Laboratory Data:  Chemistry Recent Labs  Lab 10/01/18 1850  NA 137  K 3.4*  CL 106  CO2 23  GLUCOSE 179*  BUN 17  CREATININE 1.33*  CALCIUM 8.4*  GFRNONAA 49*  GFRAA 57*  ANIONGAP 8    No results for input(s): PROT, ALBUMIN, AST, ALT, ALKPHOS, BILITOT in the last 168 hours. Hematology Recent Labs  Lab 10/01/18 1850 10/02/18 0247  WBC 5.0  --   RBC 2.34*  --   HGB 7.1* 7.3*  HCT 22.7* 22.6*  MCV 97.0  --  MCH 30.3  --   MCHC 31.3  --   RDW 17.5*  --   PLT 118*  --    Cardiac Enzymes Recent Labs  Lab 10/01/18 1850 10/02/18 0242  TROPONINI 0.65* 0.91*   No results for input(s): TROPIPOC in the last 168 hours.  BNPNo results for input(s): BNP, PROBNP in the last 168 hours.  DDimer No results for input(s): DDIMER in the last 168 hours.  Radiology/Studies:  Dg Chest 2 View  Result Date: 10/01/2018 IMPRESSION: No active cardiopulmonary disease. Aortic Atherosclerosis (ICD10-I70.0). Electronically Signed   By: Marijo Conception, M.D.   On: 10/01/2018 19:41    Assessment and Plan:   1. Elevated troponin/CAD s/p CABG: -Currently, without chest pain -Troponin is mildly elevated with a current peak of 0.91, cycle until down trending -Hold heparin gtt at this time given anemia -Recent LHC in 05/2018 with medical management being advised -No plans for inpatient ischemic evaluation at  this time in the setting of his acute blood loss anemia -IM has ordered an echo -ASA and Plavix on hold given GI bleed  2. GI bleed/symptomatic anemia/acute blood loss anemia: -Likely the etiology of his chest pain and demand ischemia -Status post 1 unit pRBC with HGB this morning trend of 7.1-->7.3 -Recommend maintaining a HGB > 8.5 -IM has consulted GI -Patient will be at least moderate risk for GI procedures. No further cardiac intervention at this time will reduce this risk -If needed, GI procedures should not be delayed   3. Chronic combined CHF: -He does not appear volume up -Not currently on diuretic as an outpatient  -Echo ordered per IM -Coreg, lisinopril -BP has previously precluded transition from ACEi to [facility] -Look to add spironolactone if BP allows in follow up once AKI is improved   4. Aortic stenosis s/p TAVR: -Recent echo as above -Outpatient follow up  5. AKI: -Likely ATN in the setting of his GI bleed -Avoid nephrotoxic agents  6. Hypokalemia: -Replete to goal 4.0  7. Thrombocytopenia: -Stable -Per IM  8. HLD: -Lipitor   For questions or updates, please contact [place] Please consult [email or web address] for contact info under Cardiology/STEMI.   Signed, Christell Faith, PA-C [place] Pager: [phone] 10/02/2018, 7:19 AM

## 2018-10-03 ENCOUNTER — Other Ambulatory Visit: Payer: Self-pay

## 2018-10-03 DIAGNOSIS — K921 Melena: Secondary | ICD-10-CM

## 2018-10-03 LAB — CBC
HEMATOCRIT: 26.8 % — AB (ref 39.0–52.0)
HEMOGLOBIN: 8.4 g/dL — AB (ref 13.0–17.0)
MCH: 30 pg (ref 26.0–34.0)
MCHC: 31.3 g/dL (ref 30.0–36.0)
MCV: 95.7 fL (ref 80.0–100.0)
Platelets: 99 10*3/uL — ABNORMAL LOW (ref 150–400)
RBC: 2.8 MIL/uL — AB (ref 4.22–5.81)
RDW: 16.6 % — ABNORMAL HIGH (ref 11.5–15.5)
WBC: 4.4 10*3/uL (ref 4.0–10.5)
nRBC: 0 % (ref 0.0–0.2)

## 2018-10-03 LAB — OCCULT BLOOD X 1 CARD TO LAB, STOOL
Fecal Occult Bld: POSITIVE — AB
Fecal Occult Bld: POSITIVE — AB
Fecal Occult Bld: POSITIVE — AB

## 2018-10-03 LAB — TYPE AND SCREEN
ABO/RH(D): O NEG
Antibody Screen: NEGATIVE
Unit division: 0
Unit division: 0

## 2018-10-03 LAB — BPAM RBC
Blood Product Expiration Date: 201911212359
Blood Product Expiration Date: 201911222359
ISSUE DATE / TIME: 201910202131
ISSUE DATE / TIME: 201910211439
Unit Type and Rh: 9500
Unit Type and Rh: 9500

## 2018-10-03 LAB — VITAMIN B12: Vitamin B-12: 128 pg/mL — ABNORMAL LOW (ref 180–914)

## 2018-10-03 MED ORDER — SODIUM CHLORIDE 0.9 % IV SOLN
200.0000 mg | Freq: Once | INTRAVENOUS | Status: AC
  Administered 2018-10-03: 200 mg via INTRAVENOUS
  Filled 2018-10-03: qty 10

## 2018-10-03 MED ORDER — ALPRAZOLAM 0.5 MG PO TABS
0.2500 mg | ORAL_TABLET | Freq: Three times a day (TID) | ORAL | Status: DC | PRN
  Administered 2018-10-03 – 2018-10-05 (×3): 0.25 mg via ORAL
  Filled 2018-10-03 (×3): qty 1

## 2018-10-03 NOTE — Progress Notes (Addendum)
   Matthew [name] , MD [address] [place], [place], [place], [address] [address], [place], [place], [place], [address] Phone: [phone]  Fax: [phone]   Matthew [name] is being followed for melena  Day 1 of follow up   Subjective: Three black tarry bowel movements today . I actually did see his BM at 5.30 pm which was melena.    Objective: Vital signs in last 24 hours: Vitals:   10/02/18 1742 10/02/18 1958 10/03/18 0449 10/03/18 0722  BP: (!) 128/49 (!) 123/45 (!) 138/52 136/74  Pulse: 61 61 (!) 56 (!) 55  Resp: 16 17 18    Temp: 98.5 F (36.9 C) 98.3 F (36.8 C) 98.2 F (36.8 C) 97.7 F (36.5 C)  TempSrc: Oral Oral  Oral  SpO2: 97% 95% 96% 94%  Weight:      Height:       Weight change:   Intake/Output Summary (Last 24 hours) at 10/03/2018 0841 Last data filed at 10/03/2018 0300 Gross per 24 hour  Intake 1786.2 ml  Output 500 ml  Net 1286.2 ml     Exam: Appears comfortable not in pain or distress.    Lab Results: @LABTEST2 @ Micro Results: No results found for this or any previous visit (from the past 240 hour(s)). Studies/Results: Dg Chest 2 View  Result Date: 10/01/2018 CLINICAL DATA:  Chest pain. EXAM: CHEST - 2 VIEW COMPARISON:  Radiograph of June 02, 2016. FINDINGS: The heart size and mediastinal contours are within normal limits. Status post transcatheter aortic valve replacement. Status post coronary artery bypass graft. No pneumothorax or pleural effusion is noted. Both lungs are clear. The visualized skeletal structures are unremarkable. IMPRESSION: No active cardiopulmonary disease. Aortic Atherosclerosis (ICD10-I70.0). Electronically Signed   By: Marijo Conception, M.D.   On: 10/01/2018 19:41   Medications: I have reviewed the patient's current medications. Scheduled Meds: . sodium chloride   Intravenous Once  . atorvastatin  80 mg Oral Daily  . carvedilol  3.125 mg Oral BID  . ferrous sulfate  325 mg Oral Q breakfast  . Influenza vac  split quadrivalent PF  0.5 mL Intramuscular Tomorrow-1000  . isosorbide dinitrate  20 mg Oral BH-q7a  . lisinopril  2.5 mg Oral Daily  . ramelteon  8 mg Oral QHS  . tamsulosin  0.4 mg Oral Daily   Continuous Infusions: . sodium chloride     PRN Meds:.acetaminophen, nitroGLYCERIN, ondansetron (ZOFRAN) IV  CBC Latest Ref Rng & Units 10/03/2018 10/02/2018 10/02/2018  WBC 4.0 - 10.5 K/uL 4.4 4.5 -  Hemoglobin 13.0 - 17.0 g/dL 8.4(L) 7.5(L) 7.3(L)  Hematocrit 39.0 - 52.0 % 26.8(L) 23.4(L) 22.6(L)  Platelets 150 - 400 K/uL 99(L) 102(L) -    Assessment: Active Problems:   Symptomatic anemia  Matthew [name] is a 82 y.o. y/o male admitted with chest pain, acute drop in hemoglobin to 7 g.  History of aortic stenosis s/p TAVR, melena and elevated troponin. Iron deficiency anemia on labs with severe b12 deficiency.  EGD+colonoscopy will be scheduled for Saturday based on him holding 5 doses of Plavix. Once cleared by cardiology . At this point of time he still has melena with 3 black bowel movements so far today .   He  has very low B12- replace IM,On IV iron      LOS: 2 days   Matthew Bellows, MD 10/03/2018, 8:41 AM

## 2018-10-03 NOTE — Plan of Care (Signed)
  Problem: Clinical Measurements: Goal: Will remain free from infection Outcome: Progressing   Problem: Education: Goal: Ability to identify signs and symptoms of gastrointestinal bleeding will improve Outcome: Progressing   Problem: Fluid Volume: Goal: Will show no signs and symptoms of excessive bleeding Outcome: Progressing

## 2018-10-03 NOTE — Plan of Care (Signed)
  Problem: Education: Goal: Knowledge of General Education information will improve Description Including pain rating scale, medication(s)/side effects and non-pharmacologic comfort measures Outcome: Progressing   Problem: Health Behavior/Discharge Planning: Goal: Ability to manage health-related needs will improve Outcome: Progressing   Problem: Clinical Measurements: Goal: Ability to maintain clinical measurements within normal limits will improve Outcome: Progressing Goal: Will remain free from infection Outcome: Progressing Goal: Diagnostic test results will improve Outcome: Progressing Goal: Respiratory complications will improve Outcome: Progressing Goal: Cardiovascular complication will be avoided Outcome: Progressing   Problem: Education: Goal: Ability to identify signs and symptoms of gastrointestinal bleeding will improve Outcome: Progressing   Problem: Bowel/Gastric: Goal: Will show no signs and symptoms of gastrointestinal bleeding Outcome: Progressing   Problem: Fluid Volume: Goal: Will show no signs and symptoms of excessive bleeding Outcome: Progressing   Problem: Clinical Measurements: Goal: Complications related to the disease process, condition or treatment will be avoided or minimized Outcome: Progressing

## 2018-10-03 NOTE — Progress Notes (Signed)
[facility] [place] NAME: Matthew [name]    MR#:  [id number]  DATE OF BIRTH:  [birth year]  SUBJECTIVE:   Patient continues to have melanotic heme positive stools.  Hemoglobin has improved posttransfusion.  He denies any shortness of breath or chest pain.  REVIEW OF SYSTEMS:    Review of Systems  Constitutional: Negative for chills and fever.  HENT: Negative for congestion and tinnitus.   Eyes: Negative for blurred vision and double vision.  Respiratory: Negative for cough, shortness of breath and wheezing.   Cardiovascular: Negative for chest pain, orthopnea and PND.  Gastrointestinal: Positive for melena. Negative for abdominal pain, diarrhea, nausea and vomiting.  Genitourinary: Negative for dysuria and hematuria.  Neurological: Negative for dizziness, sensory change and focal weakness.  All other systems reviewed and are negative.   Nutrition: Full liquids Tolerating Diet: Yes Tolerating PT: ambulatory   DRUG ALLERGIES:   Allergies  Allergen Reactions  . Ranexa [Ranolazine Er] Rash    VITALS:  Blood pressure (!) 125/49, pulse (!) 57, temperature 97.7 F (36.5 C), temperature source Oral, resp. rate 18, height 5\' 9"  (1.753 m), weight 86.9 kg, SpO2 94 %.  PHYSICAL EXAMINATION:   Physical Exam  GENERAL:  82 y.o.-year-old patient lying in bed in no acute distress.  EYES: Pupils equal, round, reactive to light and accommodation. No scleral icterus. Extraocular muscles intact. Pale Conjunctiva.  HEENT: Head atraumatic, normocephalic. Oropharynx and nasopharynx clear.  NECK:  Supple, no jugular venous distention. No thyroid enlargement, no tenderness.  LUNGS: Normal breath sounds bilaterally, no wheezing, rales, rhonchi. No use of accessory muscles of respiration.  CARDIOVASCULAR: S1, S2 normal. No murmurs, rubs, or gallops.  ABDOMEN: Soft, nontender, nondistended. Bowel sounds present. No organomegaly or mass.  EXTREMITIES:  No cyanosis, clubbing or edema b/l.    NEUROLOGIC: Cranial nerves II through XII are intact. No focal Motor or sensory deficits b/l.   PSYCHIATRIC: The patient is alert and oriented x 3.  SKIN: No obvious rash, lesion, or ulcer.    LABORATORY PANEL:   CBC Recent Labs  Lab 10/03/18 0433  WBC 4.4  HGB 8.4*  HCT 26.8*  PLT 99*   ------------------------------------------------------------------------------------------------------------------  Chemistries  Recent Labs  Lab 10/02/18 0847  NA 142  K 3.6  CL 112*  CO2 26  GLUCOSE 144*  BUN 15  CREATININE 1.15  CALCIUM 8.3*   ------------------------------------------------------------------------------------------------------------------  Cardiac Enzymes Recent Labs  Lab 10/02/18 0847  TROPONINI 0.82*   ------------------------------------------------------------------------------------------------------------------  RADIOLOGY:  Dg Chest 2 View  Result Date: 10/01/2018 CLINICAL DATA:  Chest pain. EXAM: CHEST - 2 VIEW COMPARISON:  Radiograph of June 02, 2016. FINDINGS: The heart size and mediastinal contours are within normal limits. Status post transcatheter aortic valve replacement. Status post coronary artery bypass graft. No pneumothorax or pleural effusion is noted. Both lungs are clear. The visualized skeletal structures are unremarkable. IMPRESSION: No active cardiopulmonary disease. Aortic Atherosclerosis (ICD10-I70.0). Electronically Signed   By: Marijo Conception, M.D.   On: 10/01/2018 19:41     ASSESSMENT AND PLAN:   82 year old male with past medical history of coronary artery disease status post bypass and stent placement, hypertension, hyperlipidemia, obstructive sleep apnea, GERD, aortic stenosis who presents to the hospital due to shortness of breath and chest pain.  1.  Shortness of breath/chest pain- secondary to demand ischemia from worsening anemia.  Patient had some mild troponin elevation but as per  cardiology this is demand  ischemia and not necessarily a non-ST elevation MI. -No plans for ischemic evaluation or cardiac catheterization presently.   - Transfused 2 units of PRBC's and Hg. Stable.   -Continue to hold aspirin, Plavix given the anemia.  2.  GI bleed-suspected to be upper GI bleed given the patient's melanotic stools. - Continue to hold aspirin, Plavix.  Transfused 2 units of packed red blood cells and hemoglobin improved and stable.  Seen by gastroenterology and plan for endoscopy and colonoscopy on Saturday.  Discussed option of possible discharge versus waiting to have the procedures done inpatient and family to decide on that.  3.  Acute blood loss anemia-secondary to the GI bleed. - Patient received 2 unit of packed red blood cells and hemoglobin improved and stable at 8.4.  - iron studies consistent with Iron Deficiency and will give one dose of IV Venofer.   4.  History of coronary artery disease status post bypass- patient's chest pain and shortness of breath  is secondary to demand ischemia without any evidence of acute coronary syndrome.  Continue atorvastatin, carvedilol.  Hold aspirin, Plavix for now.  5.  Essential hypertension-continue Isordil, carvedilol, lisinopril.  6.  BPH-continue Flomax.  7.  Hyperlipidemia-continue atorvastatin.  Discussed plan of care with pt, wife and also GI.    All the records are reviewed and case discussed with Care Management/Social Worker. Management plans discussed with the patient, family and they are in agreement.  CODE STATUS: Full code  DVT Prophylaxis: Ted's & SCD's.   TOTAL TIME TAKING CARE OF THIS PATIENT: 30 minutes.   POSSIBLE D/C IN 3-4 DAYS, DEPENDING ON CLINICAL CONDITION.   Henreitta Leber M.D on 10/03/2018 at 2:19 PM  Between 7am to 6pm - Pager - [phone]  After 6pm go to [email or web address] - Proofreader  [facility]  Office  [phone]  CC: Primary care  physician; Barry Dienes, NP

## 2018-10-04 DIAGNOSIS — D62 Acute posthemorrhagic anemia: Principal | ICD-10-CM

## 2018-10-04 LAB — CBC
HCT: 27.7 % — ABNORMAL LOW (ref 39.0–52.0)
HEMOGLOBIN: 8.5 g/dL — AB (ref 13.0–17.0)
MCH: 29.9 pg (ref 26.0–34.0)
MCHC: 30.7 g/dL (ref 30.0–36.0)
MCV: 97.5 fL (ref 80.0–100.0)
PLATELETS: 107 10*3/uL — AB (ref 150–400)
RBC: 2.84 MIL/uL — ABNORMAL LOW (ref 4.22–5.81)
RDW: 16 % — AB (ref 11.5–15.5)
WBC: 4.5 10*3/uL (ref 4.0–10.5)
nRBC: 0 % (ref 0.0–0.2)

## 2018-10-04 NOTE — Care Management (Signed)
Planning on endoscopy and colonoscopy on Saturday.  Holding plavix and aspirin.  Continue to have melanotic stools.

## 2018-10-04 NOTE — Progress Notes (Signed)
   Matthew [name] , MD [address] [place], [place], [place], [address] [address], [place], [place], [place], [address] Phone: [phone]  Fax: [phone]   Matthew [name] is being followed for melena   Subjective: Stools are brown in color    Objective: Vital signs in last 24 hours: Vitals:   10/03/18 1629 10/03/18 1932 10/04/18 0410 10/04/18 0737  BP: (!) 123/50 (!) 139/46 (!) 134/52 (!) 119/48  Pulse: (!) 55 64 (!) 56 (!) 58  Resp:      Temp: 98.3 F (36.8 C) 98.3 F (36.8 C) 97.8 F (36.6 C) (!) 97.5 F (36.4 C)  TempSrc: Oral Oral Oral Oral  SpO2: 96% 97% 97% 95%  Weight:      Height:       Weight change:   Intake/Output Summary (Last 24 hours) at 10/04/2018 2023 Last data filed at 10/04/2018 0003 Gross per 24 hour  Intake 1080 ml  Output 200 ml  Net 880 ml     Exam: Heart:: Regular rate and rhythm, S1S2 present or without murmur or extra heart sounds Lungs: normal, clear to auscultation and clear to auscultation and percussion Abdomen: soft, nontender, normal bowel sounds   Lab Results: @LABTEST2 @ Micro Results: No results found for this or any previous visit (from the past 240 hour(s)). Studies/Results: No results found. Medications: I have reviewed the patient's current medications. Scheduled Meds: . sodium chloride   Intravenous Once  . atorvastatin  80 mg Oral Daily  . carvedilol  3.125 mg Oral BID  . ferrous sulfate  325 mg Oral Q breakfast  . isosorbide dinitrate  20 mg Oral BH-q7a  . lisinopril  2.5 mg Oral Daily  . ramelteon  8 mg Oral QHS  . tamsulosin  0.4 mg Oral Daily   Continuous Infusions: . sodium chloride     PRN Meds:.acetaminophen, ALPRAZolam, nitroGLYCERIN, ondansetron (ZOFRAN) IV  CBC Latest Ref Rng & Units 10/04/2018 10/03/2018 10/02/2018  WBC 4.0 - 10.5 K/uL 4.5 4.4 4.5  Hemoglobin 13.0 - 17.0 g/dL 8.5(L) 8.4(L) 7.5(L)  Hematocrit 39.0 - 52.0 % 27.7(L) 26.8(L) 23.4(L)  Platelets 150 - 400 K/uL 107(L) 99(L)  102(L)    Assessment: Active Problems:   Symptomatic anemia   Matthew [name] a 82 y.o.y/o maleadmitted with chest pain, acute drop in hemoglobin to 7 g. History of aortic stenosis s/p TAVR, melena and elevated troponin. Iron deficiency anemia on labs with severe b12 deficiency.  EGD+colonoscopy will be scheduled for Saturday based on him holding 5 doses of Plavix.Once cleared by cardiology . At this point of time he still has melena with 3 black bowel movements so far today .   On IV iron , replace b12    LOS: 3 days   Matthew Bellows, MD 10/04/2018, 9:28 AM

## 2018-10-04 NOTE — Care Management Important Message (Signed)
Copy of signed IM left with patient in room.  

## 2018-10-04 NOTE — Progress Notes (Signed)
[facility] at [place] NAME: Matthew [name]    MR#:  [id number]  DATE OF BIRTH:  Oct 16, 1936  SUBJECTIVE:   Patient's hemoglobin is improved posttransfusion.  His stools are more brown in color today.  Plan for upper GI endoscopy to Saturday.  Continue to hold Plavix.  Patient denies any shortness of breath or chest pain.  REVIEW OF SYSTEMS:    Review of Systems  Constitutional: Negative for chills and fever.  HENT: Negative for congestion and tinnitus.   Eyes: Negative for blurred vision and double vision.  Respiratory: Negative for cough, shortness of breath and wheezing.   Cardiovascular: Negative for chest pain, orthopnea and PND.  Gastrointestinal: Positive for melena. Negative for abdominal pain, diarrhea, nausea and vomiting.  Genitourinary: Negative for dysuria and hematuria.  Neurological: Negative for dizziness, sensory change and focal weakness.  All other systems reviewed and are negative.   Nutrition: Heart healthy Tolerating Diet: Yes Tolerating PT: ambulatory   DRUG ALLERGIES:   Allergies  Allergen Reactions  . Ranexa [Ranolazine Er] Rash    VITALS:  Blood pressure (!) 119/48, pulse (!) 58, temperature (!) 97.5 F (36.4 C), temperature source Oral, resp. rate 18, height 5\' 9"  (1.753 m), weight 86.9 kg, SpO2 95 %.  PHYSICAL EXAMINATION:   Physical Exam  GENERAL:  82 y.o.-year-old patient lying in bed in no acute distress.  EYES: Pupils equal, round, reactive to light and accommodation. No scleral icterus. Extraocular muscles intact.  HEENT: Head atraumatic, normocephalic. Oropharynx and nasopharynx clear.  NECK:  Supple, no jugular venous distention. No thyroid enlargement, no tenderness.  LUNGS: Normal breath sounds bilaterally, no wheezing, rales, rhonchi. No use of accessory muscles of respiration.  CARDIOVASCULAR: S1, S2 normal. No murmurs, rubs, or gallops.  ABDOMEN: Soft, nontender, nondistended. Bowel  sounds present. No organomegaly or mass.  EXTREMITIES: No cyanosis, clubbing or edema b/l.    NEUROLOGIC: Cranial nerves II through XII are intact. No focal Motor or sensory deficits b/l.   PSYCHIATRIC: The patient is alert and oriented x 3.  SKIN: No obvious rash, lesion, or ulcer.    LABORATORY PANEL:   CBC Recent Labs  Lab 10/04/18 0515  WBC 4.5  HGB 8.5*  HCT 27.7*  PLT 107*   ------------------------------------------------------------------------------------------------------------------  Chemistries  Recent Labs  Lab 10/02/18 0847  NA 142  K 3.6  CL 112*  CO2 26  GLUCOSE 144*  BUN 15  CREATININE 1.15  CALCIUM 8.3*   ------------------------------------------------------------------------------------------------------------------  Cardiac Enzymes Recent Labs  Lab 10/02/18 0847  TROPONINI 0.82*   ------------------------------------------------------------------------------------------------------------------  RADIOLOGY:  No results found.   ASSESSMENT AND PLAN:   82 year old male with past medical history of coronary artery disease status post bypass and stent placement, hypertension, hyperlipidemia, obstructive sleep apnea, GERD, aortic stenosis who presents to the hospital due to shortness of breath and chest pain.  1.  Shortness of breath/chest pain- secondary to demand ischemia from worsening anemia.  Patient had some mild troponin elevation but as per cardiology this is demand ischemia and not  a non-ST elevation MI. -No plans for ischemic evaluation  - Transfused 2 units of PRBC's and Hg. Stable.   -Continue to hold aspirin, Plavix   2.  GI bleed-suspected to be upper GI bleed given the patient's melanotic stools. - Continue to hold aspirin, Plavix.  Transfused 2 units of packed red blood cells and hemoglobin improved and stable.  Seen by gastroenterology and plan for endoscopy and  colonoscopy on Saturday.   - diet advanced to heart healthy  3.   Acute blood loss anemia-secondary to the GI bleed. - Patient received 2 unit of packed red blood cells and hemoglobin improved and stable at 8.5.  - iron studies consistent with Iron Deficiency and given one dose of IV venofer.  4.  History of coronary artery disease status post bypass- patient's chest pain and shortness of breath  is secondary to demand ischemia without any evidence of acute coronary syndrome.  - Continue atorvastatin, carvedilol.  Hold aspirin, Plavix for now.  5.  Essential hypertension-continue Isordil, carvedilol, lisinopril.  6.  BPH-continue Flomax.  7.  Hyperlipidemia-continue atorvastatin.  Discussed plan of care with pt  All the records are reviewed and case discussed with Care Management/Social Worker. Management plans discussed with the patient, family and they are in agreement.  CODE STATUS: Full code  DVT Prophylaxis: Ted's & SCD's.   TOTAL TIME TAKING CARE OF THIS PATIENT: 30 minutes.   POSSIBLE D/C IN 3-4 DAYS, DEPENDING ON CLINICAL CONDITION.   Henreitta Leber M.D on 10/04/2018 at 2:39 PM  Between 7am to 6pm - Pager - [phone]  After 6pm go to [email or web address] - Proofreader  [facility]  Office  [phone]  CC: Primary care physician; Barry Dienes, NP

## 2018-10-05 LAB — HEMOGLOBIN: HEMOGLOBIN: 8.3 g/dL — AB (ref 13.0–17.0)

## 2018-10-05 NOTE — Progress Notes (Signed)
Pt refused cpap

## 2018-10-05 NOTE — Progress Notes (Signed)
[facility] at [place] NAME: Matthew [name]    MR#:  [id number]  DATE OF BIRTH:  22-Jun-1936  SUBJECTIVE:   Stools are becoming lighter Denies abdominal pain REVIEW OF SYSTEMS:    Review of Systems  Constitutional: Negative for chills and fever.  HENT: Negative for congestion and tinnitus.   Eyes: Negative for blurred vision and double vision.  Respiratory: Negative for cough, shortness of breath and wheezing.   Cardiovascular: Negative for chest pain, orthopnea and PND.  Gastrointestinal: Positive for melena. Negative for abdominal pain, diarrhea, nausea and vomiting.  Genitourinary: Negative for dysuria and hematuria.  Neurological: Negative for dizziness, sensory change and focal weakness.  All other systems reviewed and are negative.   Nutrition: Heart healthy Tolerating Diet: Yes Tolerating PT: ambulatory   DRUG ALLERGIES:   Allergies  Allergen Reactions  . Ranexa [Ranolazine Er] Rash    VITALS:  Blood pressure (!) 127/52, pulse (!) 56, temperature 98.7 F (37.1 C), temperature source Oral, resp. rate 18, height 5\' 9"  (1.753 m), weight 86.9 kg, SpO2 95 %.  PHYSICAL EXAMINATION:   Physical Exam  GENERAL:  82 y.o.-year-old patient lying in bed in no acute distress.  EYES: Pupils equal, round, reactive to light and accommodation. No scleral icterus. Extraocular muscles intact.  HEENT: Head atraumatic, normocephalic. Oropharynx and nasopharynx clear.  NECK:  Supple, no jugular venous distention. No thyroid enlargement, no tenderness.  LUNGS: Normal breath sounds bilaterally, no wheezing, rales, rhonchi. No use of accessory muscles of respiration.  CARDIOVASCULAR: S1, S2 normal. No murmurs, rubs, or gallops.  ABDOMEN: Soft, nontender, nondistended. Bowel sounds present. No organomegaly or mass.  EXTREMITIES: No cyanosis, clubbing or edema b/l.    NEUROLOGIC: Cranial nerves II through XII are intact. No focal Motor or  sensory deficits b/l.   PSYCHIATRIC: The patient is alert and oriented x 3.  SKIN: No obvious rash, lesion, or ulcer.    LABORATORY PANEL:   CBC Recent Labs  Lab 10/04/18 0515 10/05/18 0739  WBC 4.5  --   HGB 8.5* 8.3*  HCT 27.7*  --   PLT 107*  --    ------------------------------------------------------------------------------------------------------------------  Chemistries  Recent Labs  Lab 10/02/18 0847  NA 142  K 3.6  CL 112*  CO2 26  GLUCOSE 144*  BUN 15  CREATININE 1.15  CALCIUM 8.3*   ------------------------------------------------------------------------------------------------------------------  Cardiac Enzymes Recent Labs  Lab 10/02/18 0847  TROPONINI 0.82*   ------------------------------------------------------------------------------------------------------------------  RADIOLOGY:  No results found.   ASSESSMENT AND PLAN:   82 year old male with past medical history of coronary artery disease status post bypass and stent placement, hypertension, hyperlipidemia, obstructive sleep apnea, GERD, aortic stenosis who presents to the hospital due to shortness of breath and chest pain.  1.  Elevated troponin with shortness of breath/chest pain due to demand ischemia from worsening anemia.   He was eval by cardiology and has ruled out for ACS.    2.  Suspected upper GI bleed with acute blood loss anemia Holding aspirin, Plavix.   Plan for endoscopy and colonoscopy in Saturday GI consultation appreciated  3.  Acute blood loss anemia secondary to the GI bleed.  Patient received 2 unit of packed red blood cells and hemoglobin improved and stable at 8.3.  Iron studies consistent with Iron Deficiency and given one dose of IV venofer.  4.  History of coronary artery disease status post bypass: Continue atorvastatin, isosorbide , lisinopril carvedilol.   Hold aspirin,  Plavix for now due to planned procedure on Saturday 5.  Essential hypertension:  Continue Isordil, carvedilol, lisinopril.  6.  BPH :Cocontinue Flomax.  7.  Hyperlipidemia: Continue atorvastatin.  Discussed plan of care with pt  All the records are reviewed and case discussed with Care Management/Social Worker. Management plans discussed with the patient and he is in agreement.  CODE STATUS: Full code  DVT Prophylaxis: Ted's & SCD's.   TOTAL TIME TAKING CARE OF THIS PATIENT: 21 minutes.   POSSIBLE D/C IN saturday, DEPENDING ON CLINICAL CONDITION.   Candy Leverett M.D on 10/05/2018 at 11:34 AM  Between 7am to 6pm - Pager - [phone]  After 6pm go to [email or web address] - Proofreader  [facility]  Office  [phone]  CC: Primary care physician; Barry Dienes, NP

## 2018-10-06 DIAGNOSIS — D509 Iron deficiency anemia, unspecified: Secondary | ICD-10-CM

## 2018-10-06 LAB — HEMOGLOBIN: HEMOGLOBIN: 9.2 g/dL — AB (ref 13.0–17.0)

## 2018-10-06 MED ORDER — PEG 3350-KCL-NA BICARB-NACL 420 G PO SOLR
4000.0000 mL | Freq: Once | ORAL | Status: AC
  Administered 2018-10-06: 4000 mL via ORAL
  Filled 2018-10-06: qty 4000

## 2018-10-06 MED ORDER — SODIUM CHLORIDE 0.9% FLUSH
3.0000 mL | Freq: Two times a day (BID) | INTRAVENOUS | Status: DC
  Administered 2018-10-06: 3 mL via INTRAVENOUS

## 2018-10-06 MED ORDER — SODIUM CHLORIDE 0.9% FLUSH
3.0000 mL | Freq: Two times a day (BID) | INTRAVENOUS | Status: DC
  Administered 2018-10-06 (×2): 3 mL via INTRAVENOUS

## 2018-10-06 MED ORDER — SODIUM CHLORIDE 0.9 % IV SOLN
INTRAVENOUS | Status: DC
  Administered 2018-10-06 – 2018-10-07 (×2): via INTRAVENOUS

## 2018-10-06 NOTE — Care Management Note (Signed)
Case Management Note  Patient Details  Name: Matthew [name] MRN: [id number] Date of Birth: 1935-12-27  Subjective/Objective:  Patient from home; lives with wife and is independent. Admitted with symptomatic anemia.  Plavix on hold for 5 days and will have EGD and colonoscopy on 10-07-18.  Independent in all adls, denies issues accessing medical care, obtaining medications or with transportation.  Current with PCP.  No discharge needs identified at present by care manager or members of care team.                        Action/Plan:Plan to discharge to home after procedures tomorrow.   Expected Discharge Date:                  Expected Discharge Plan:  Home/Self Care  In-House Referral:     Discharge planning Services  CM Consult  Post Acute Care Choice:    Choice offered to:     DME Arranged:    DME Agency:     HH Arranged:    HH Agency:     Status of Service:  In process, will continue to follow  If discussed at Long Length of Stay Meetings, dates discussed:    Additional Comments:  Elza Rafter, RN 10/06/2018, 3:40 PM

## 2018-10-06 NOTE — Progress Notes (Signed)
Consent for EGD and Colonoscopy obtained and put on chart.

## 2018-10-06 NOTE — Care Management Important Message (Signed)
Copy of signed IM left with patient in room.  

## 2018-10-06 NOTE — Progress Notes (Signed)
   Matthew [name] , MD [address], [place], [place], [place], [address] [address], [place], [place], [place], [address] Phone: [phone]  Fax: [phone]   Matthew [name] is being followed for iron deficiency anemia and melena  Day 5 of follow up   Subjective: No bowel movements    Objective: Vital signs in last 24 hours: Vitals:   10/05/18 1940 10/06/18 0622 10/06/18 0752 10/06/18 0952  BP: (!) 135/48 (!) 147/61 (!) 133/48   Pulse: 62 62 (!) 55 73  Resp: 17 18    Temp: 98.1 F (36.7 C) 97.8 F (36.6 C) (!) 97.4 F (36.3 C)   TempSrc: Oral Oral Oral   SpO2: 97% 99% 97%   Weight:      Height:       Weight change:  No intake or output data in the 24 hours ending 10/06/18 1321   Exam: Heart:: Regular rate and rhythm, B0S1 present ,systolic murmur , no extra heart sounds Lungs: normal, clear to auscultation and clear to auscultation and percussion Abdomen: soft, nontender, normal bowel sounds   Lab Results: @LABTEST2 @ Micro Results: No results found for this or any previous visit (from the past 240 hour(s)). Studies/Results: No results found. Medications: I have reviewed the patient's current medications. Scheduled Meds: . sodium chloride   Intravenous Once  . atorvastatin  80 mg Oral Daily  . carvedilol  3.125 mg Oral BID  . ferrous sulfate  325 mg Oral Q breakfast  . isosorbide dinitrate  20 mg Oral BH-[id number]  . lisinopril  2.5 mg Oral Daily  . ramelteon  8 mg Oral QHS  . sodium chloride flush  3 mL Intravenous Q12H  . sodium chloride flush  3 mL Intravenous Q12H  . tamsulosin  0.4 mg Oral Daily   Continuous Infusions: . sodium chloride     PRN Meds:.acetaminophen, ALPRAZolam, nitroGLYCERIN, ondansetron (ZOFRAN) IV   Assessment: Active Problems:   Symptomatic anemia  [name] a 82 y.o.y/o maleadmitted with chest pain, acute drop in hemoglobin to 7 g. History of aortic stenosiss/p TAVR,melenaandelevated troponin. Iron deficiency  anemia on labs with severe b12 deficiency.EGD+colonoscopy will be scheduled for Saturday based on him holding 5 doses of Plavix..   On IV iron , replace b12  I have discussed alternative options, risks & benefits,  which include, but are not limited to, bleeding, infection, perforation,respiratory complication & drug reaction.  The patient agrees with this plan & written consent will be obtained.  \   LOS: 5 days   Matthew Bellows, MD 10/06/2018, 1:21 PM

## 2018-10-06 NOTE — Progress Notes (Signed)
[facility] NAME: Matthew [name]    MR#:  [id number]  DATE OF BIRTH:  07/01/1936  SUBJECTIVE:   Stools are becoming lighter Denies abdominal pain REVIEW OF SYSTEMS:    Review of Systems  Constitutional: Negative for chills and fever.  HENT: Negative for congestion and tinnitus.   Eyes: Negative for blurred vision and double vision.  Respiratory: Negative for cough, shortness of breath and wheezing.   Cardiovascular: Negative for chest pain, orthopnea and PND.  Gastrointestinal: Positive for melena. Negative for abdominal pain, diarrhea, nausea and vomiting.  Genitourinary: Negative for dysuria and hematuria.  Neurological: Negative for dizziness, sensory change and focal weakness.  All other systems reviewed and are negative.   Nutrition: Heart healthy Tolerating Diet: Yes Tolerating PT: ambulatory   DRUG ALLERGIES:   Allergies  Allergen Reactions  . Ranexa [Ranolazine Er] Rash    VITALS:  Blood pressure (!) 133/48, pulse 73, temperature (!) 97.4 F (36.3 C), temperature source Oral, resp. rate 18, height 5\' 9"  (1.753 m), weight 86.9 kg, SpO2 97 %.  PHYSICAL EXAMINATION:   Physical Exam  GENERAL:  82 y.o.-year-old patient lying in bed in no acute distress.  EYES: Pupils equal, round, reactive to light and accommodation. No scleral icterus. Extraocular muscles intact.  HEENT: Head atraumatic, normocephalic. Oropharynx and nasopharynx clear.  NECK:  Supple, no jugular venous distention. No thyroid enlargement, no tenderness.  LUNGS: Normal breath sounds bilaterally, no wheezing, rales, rhonchi. No use of accessory muscles of respiration.  CARDIOVASCULAR: S1, S2 normal. No murmurs, rubs, or gallops.  ABDOMEN: Soft, nontender, nondistended. Bowel sounds present. No organomegaly or mass.  EXTREMITIES: No cyanosis, clubbing or edema b/l.    NEUROLOGIC: Cranial nerves II through XII are intact. No focal Motor or  sensory deficits b/l.   PSYCHIATRIC: The patient is alert and oriented x 3.  SKIN: No obvious rash, lesion, or ulcer.    LABORATORY PANEL:   CBC Recent Labs  Lab 10/04/18 0515  10/06/18 0854  WBC 4.5  --   --   HGB 8.5*   < > 9.2*  HCT 27.7*  --   --   PLT 107*  --   --    < > = values in this interval not displayed.   ------------------------------------------------------------------------------------------------------------------  Chemistries  Recent Labs  Lab 10/02/18 0847  NA 142  K 3.6  CL 112*  CO2 26  GLUCOSE 144*  BUN 15  CREATININE 1.15  CALCIUM 8.3*   ------------------------------------------------------------------------------------------------------------------  Cardiac Enzymes Recent Labs  Lab 10/02/18 0847  TROPONINI 0.82*   ------------------------------------------------------------------------------------------------------------------  RADIOLOGY:  No results found.   ASSESSMENT AND PLAN:   82 year old male with past medical history of coronary artery disease status post bypass and stent placement, hypertension, hyperlipidemia, obstructive sleep apnea, GERD, aortic stenosis who presents to the hospital due to shortness of breath and chest pain.  1.  Elevated troponin with shortness of breath/chest pain due to demand ischemia from worsening anemia.   He was evalualted by cardiology and has ruled out for ACS.    2.  Suspected upper GI bleed with acute blood loss anemia Holding aspirin, Plavix.   Plan for endoscopy and colonoscopy on Saturday GI consultation appreciated  3.  Acute blood loss anemia secondary to the GI bleed.  Patient received 2 unit of packed red blood cells and hemoglobin improved and stable at 9.2.  Iron studies consistent with Iron Deficiency and given one  dose of IV venofer.  4.  History of coronary artery disease status post bypass: Continue atorvastatin, isosorbide , lisinopril carvedilol.   Hold aspirin, Plavix for  now due to planned procedure on Saturday 5.  Essential hypertension: Continue Isordil, carvedilol, lisinopril.  6.  BPH :Cocontinue Flomax.  7.  Hyperlipidemia: Continue atorvastatin.  Discussed plan of care with pt  All the records are reviewed and case discussed with Care Management/Social Worker. Management plans discussed with the patient and he is in agreement.  CODE STATUS: Full code  DVT Prophylaxis: Ted's & SCD's.   TOTAL TIME TAKING CARE OF THIS PATIENT: 21 minutes.   POSSIBLE D/C ON saturday, DEPENDING ON CLINICAL CONDITION.   [name] M.D on 10/06/2018 at 11:15 AM  Between 7am to 6pm - Pager - [phone]  After 6pm go to [email or web address] - Proofreader  [facility]  Office  [phone]  CC: Primary care physician; Barry Dienes, NP

## 2018-10-07 ENCOUNTER — Encounter
Admission: EM | Disposition: A | Discharge status: Home / Self Care | Payer: Self-pay | Source: Home / Self Care | Attending: Specialist

## 2018-10-07 ENCOUNTER — Inpatient Hospital Stay: Payer: Medicare HMO | Admitting: Anesthesiology

## 2018-10-07 ENCOUNTER — Encounter: Payer: Self-pay | Admitting: Certified Registered Nurse Anesthetist

## 2018-10-07 DIAGNOSIS — K449 Diaphragmatic hernia without obstruction or gangrene: Secondary | ICD-10-CM

## 2018-10-07 DIAGNOSIS — K31819 Angiodysplasia of stomach and duodenum without bleeding: Secondary | ICD-10-CM

## 2018-10-07 DIAGNOSIS — D120 Benign neoplasm of cecum: Secondary | ICD-10-CM

## 2018-10-07 DIAGNOSIS — D123 Benign neoplasm of transverse colon: Secondary | ICD-10-CM

## 2018-10-07 DIAGNOSIS — K922 Gastrointestinal hemorrhage, unspecified: Secondary | ICD-10-CM

## 2018-10-07 DIAGNOSIS — K5730 Diverticulosis of large intestine without perforation or abscess without bleeding: Secondary | ICD-10-CM

## 2018-10-07 HISTORY — PX: ESOPHAGOGASTRODUODENOSCOPY (EGD) WITH PROPOFOL: SHX5813

## 2018-10-07 HISTORY — PX: COLONOSCOPY WITH PROPOFOL: SHX5780

## 2018-10-07 LAB — HEMOGLOBIN: Hemoglobin: 8.9 g/dL — ABNORMAL LOW (ref 13.0–17.0)

## 2018-10-07 SURGERY — ESOPHAGOGASTRODUODENOSCOPY (EGD) WITH PROPOFOL
Anesthesia: General

## 2018-10-07 MED ORDER — PANTOPRAZOLE SODIUM 40 MG PO TBEC
40.0000 mg | DELAYED_RELEASE_TABLET | Freq: Every day | ORAL | Status: DC
  Filled 2018-10-07: qty 1

## 2018-10-07 MED ORDER — PROPOFOL 10 MG/ML IV BOLUS
INTRAVENOUS | Status: DC | PRN
  Administered 2018-10-07 (×2): 10 mg via INTRAVENOUS
  Administered 2018-10-07: 20 mg via INTRAVENOUS
  Administered 2018-10-07: 30 mg via INTRAVENOUS

## 2018-10-07 MED ORDER — PROPOFOL 500 MG/50ML IV EMUL
INTRAVENOUS | Status: AC
  Filled 2018-10-07: qty 50

## 2018-10-07 MED ORDER — LIDOCAINE HCL (CARDIAC) PF 100 MG/5ML IV SOSY
PREFILLED_SYRINGE | INTRAVENOUS | Status: DC | PRN
  Administered 2018-10-07: 50 mg via INTRAVENOUS

## 2018-10-07 MED ORDER — EPHEDRINE SULFATE 50 MG/ML IJ SOLN
INTRAMUSCULAR | Status: DC | PRN
  Administered 2018-10-07: 5 mg via INTRAVENOUS

## 2018-10-07 MED ORDER — CYANOCOBALAMIN 1000 MCG/ML IJ SOLN
1000.0000 ug | Freq: Once | INTRAMUSCULAR | Status: AC
  Administered 2018-10-07: 1000 ug via INTRAMUSCULAR
  Filled 2018-10-07: qty 1

## 2018-10-07 MED ORDER — PHENYLEPHRINE HCL 10 MG/ML IJ SOLN
INTRAMUSCULAR | Status: DC | PRN
  Administered 2018-10-07 (×2): 100 ug via INTRAVENOUS

## 2018-10-07 MED ORDER — PROPOFOL 500 MG/50ML IV EMUL
INTRAVENOUS | Status: DC | PRN
  Administered 2018-10-07: 150 ug/kg/min via INTRAVENOUS

## 2018-10-07 MED ORDER — PHENYLEPHRINE HCL 10 MG/ML IJ SOLN
INTRAMUSCULAR | Status: AC
  Filled 2018-10-07: qty 1

## 2018-10-07 MED ORDER — LIDOCAINE HCL (PF) 2 % IJ SOLN
INTRAMUSCULAR | Status: AC
  Filled 2018-10-07: qty 10

## 2018-10-07 MED ORDER — SODIUM CHLORIDE 0.9 % IV SOLN
INTRAVENOUS | Status: DC
  Administered 2018-10-07: 08:00:00 via INTRAVENOUS

## 2018-10-07 MED ORDER — SODIUM CHLORIDE 0.9 % IV SOLN
510.0000 mg | Freq: Once | INTRAVENOUS | Status: AC
  Administered 2018-10-07: 510 mg via INTRAVENOUS
  Filled 2018-10-07: qty 17

## 2018-10-07 MED ORDER — EPHEDRINE SULFATE 50 MG/ML IJ SOLN
INTRAMUSCULAR | Status: AC
  Filled 2018-10-07: qty 1

## 2018-10-07 NOTE — Progress Notes (Signed)
Discharged to home with his wife.  Instructions not to drive for the remainder of the day.  Explained to him that he needs to make his follow up appointments as it is Saturday and we can't make them.

## 2018-10-07 NOTE — Anesthesia Preprocedure Evaluation (Signed)
Anesthesia Evaluation  Patient identified by MRN, date of birth, ID band Patient awake    Reviewed: Allergy & Precautions, H&P , NPO status , Patient's Chart, lab work & pertinent test results  History of Anesthesia Complications Negative for: history of anesthetic complications  Airway Mallampati: III  TM Distance: <3 FB Neck ROM: full    Dental  (+) Chipped, Edentulous Upper, Edentulous Lower   Pulmonary shortness of breath and with exertion, sleep apnea , former smoker,           Cardiovascular Exercise Tolerance: Good hypertension, + angina + CAD, + Past MI, + CABG and + Peripheral Vascular Disease       Neuro/Psych  Neuromuscular disease negative psych ROS   GI/Hepatic Neg liver ROS, GERD  Medicated and Controlled,  Endo/Other  negative endocrine ROS  Renal/GU Renal diseasenegative Renal ROS  negative genitourinary   Musculoskeletal  (+) Arthritis ,   Abdominal   Peds  Hematology negative hematology ROS (+)   Anesthesia Other Findings Past Medical History: No date: AS (aortic stenosis)     Comment:  a. Echo 6/10: EF 55% mild AS; b. echo 06/2015; EF 55-60%,              GR1DD, moderate AS, Peak velocity (S): 346 cm/s. Mean               gradientS): 28 mm Hg. Peak gradient (S): 48 mm Hg. Valve               area (VTI): 1.18 cm2;   c. Echo 4/17 - mild LVH, EF               55-60%, no RWMA, Gr 1 DD, mod to severe AS (mean 28 mmHg,              peak 46 mmHg), mild LAE No date: CAD (coronary artery disease)     Comment:  a. MI 1996 w/ CABG 1996; b.redo CABG 06/2003; c. Myoview               7/09: EF 54% inferobasal infarct, no ischemia. Myoview               6/10 EF 43% inf wall infarct. no ischemia; c. cath 8/16               s/p DES to VG-OM2. OTW 3VD w/ patent VG->RPDA, VG->OM3,                & LIMA->LAD.  EF 55-65%. No date: Carotid bruit     Comment:  2009 0-39% on dopplers bilatrally No date: GERD  (gastroesophageal reflux disease) No date: History of kidney stones No date: HOH (hard of hearing) No date: HTN (hypertension) No date: Hyperlipidemia 1996: Myocardial infarction ([place]) No date: Nephrolithiasis No date: OSA (obstructive sleep apnea) 02/2014: PAD (peripheral artery disease) (HCC)     Comment:  Subtotal occlusion of right common iliac artery and 70%               stenosis in the left common iliac artery. Status post               bilateral kissing stent placement. Significant post               stenosis aneurysmal dilatation on the right side (any               future catheterization through the right femoral artery  should be done cautiously to avoid advancing the wire               behind the stent struts) No date: Shortness of breath     Comment:  with exertion No date: Thrombocytopenia ([place])  Past Surgical History: 03/06/2014: ABDOMINAL Maxcine Ham; N/A     Comment:  Procedure: ABDOMINAL AORTAGRAM;  Surgeon: Wellington Hampshire, MD;  Location: [facility] C[facility];  Service:               Cardiovascular;  Laterality: N/A; 02/2014: CARDIAC CATHETERIZATION     Comment:  Severe three-vessel coronary artery disease with patent               grafts.  07/31/2015: CARDIAC CATHETERIZATION; N/A     Comment:  Procedure: Right/Left Heart Cath and Coronary/Graft               Angiography;  Surgeon: Wellington Hampshire, MD;  Location:               [facility];  Service: Cardiovascular;                Laterality: N/A; 07/31/2015: CARDIAC CATHETERIZATION; N/A     Comment:  Procedure: Coronary Stent Intervention;  Surgeon:               Wellington Hampshire, MD;  Location: [facility];                Service: Cardiovascular;  Laterality: N/A; 05/26/2016: CARDIAC CATHETERIZATION; N/A     Comment:  Procedure: Right/Left Heart Cath and Coronary               Angiography;  Surgeon: Wellington Hampshire, MD;  Location:               [facility] CV [facility];  Service:  Cardiovascular;                Laterality: N/A; 1991: CORONARY ARTERY BYPASS GRAFT     Comment:  at [facility]. Redone 2004-3 vessels 1st time and 4 second               time No date: CYSTO     Comment:  2/3 times for kidney stones 11/10/2011: INGUINAL HERNIA REPAIR     Comment:  Procedure: HERNIA REPAIR INGUINAL ADULT;  Surgeon: Donato Heinz;  Location: [facility];  Service: General;                Laterality: Right; 05/15/2018: LEFT HEART CATH AND CORONARY ANGIOGRAPHY; Left     Comment:  Procedure: LEFT HEART CATH AND CORONARY ANGIOGRAPHY;                Surgeon: Wellington Hampshire, MD;  Location: [facility]               CV LAB;  Service: Cardiovascular;  Laterality: Left; 03/06/2014: LEFT HEART CATHETERIZATION WITH CORONARY/GRAFT ANGIOGRAM;  N/A     Comment:  Procedure: LEFT HEART CATHETERIZATION WITH               Beatrix Fetters;  Surgeon: Wellington Hampshire, MD;              Location: [facility] C[facility];  Service: Cardiovascular;  Laterality: N/A; No date: STOMACH SURGERY     Comment:  removal of gastric ulcers 06/01/2016: TEE WITHOUT CARDIOVERSION; N/A     Comment:  Procedure: TRANSESOPHAGEAL ECHOCARDIOGRAM (TEE);                Surgeon: Sherren Mocha, MD;  Location: [facility];  Service:               Open Heart Surgery;  Laterality: N/A; 06/01/2016: TRANSCATHETER AORTIC VALVE REPLACEMENT, TRANSFEMORAL; N/A     Comment:  Procedure: TRANSCATHETER AORTIC VALVE REPLACEMENT,               TRANSFEMORAL;  Surgeon: Sherren Mocha, MD;  Location: [facility];  Service: Open Heart Surgery;  Laterality: N/A;  BMI    Body Mass Index:  28.28 kg/m      Reproductive/Obstetrics negative OB ROS                             Anesthesia Physical Anesthesia Plan  ASA: III  Anesthesia Plan: General   Post-op Pain Management:    Induction: Intravenous  PONV Risk Score and Plan: Propofol infusion and TIVA  Airway Management Planned:  Natural Airway and Nasal Cannula  Additional Equipment:   Intra-op Plan:   Post-operative Plan:   Informed Consent: I have reviewed the patients History and Physical, chart, labs and discussed the procedure including the risks, benefits and alternatives for the proposed anesthesia with the patient or authorized representative who has indicated his/her understanding and acceptance.   Dental Advisory Given  Plan Discussed with: Anesthesiologist, CRNA and Surgeon  Anesthesia Plan Comments: (Patient consented for risks of anesthesia including but not limited to:  - adverse reactions to medications - risk of intubation if required - damage to teeth, lips or other oral mucosa - sore throat or hoarseness - Damage to heart, brain, lungs or loss of life  Patient voiced understanding.)        Anesthesia Quick Evaluation

## 2018-10-07 NOTE — Care Management Note (Signed)
Case Management Note  Patient Details  Name: Matthew [name] MRN: [id number] Date of Birth: Sep 16, 1936  Subjective/Objective:  Patient to be discharged per MD order. Orders in place for home health services. Patient and family state that there is no need for home health and the patient plans to leave here and "go mow his yard". Patient lives with his spouse and ambulates completely independently. Patient drives. No RNCM needs                   Action/Plan:   Expected Discharge Date:  10/07/18               Expected Discharge Plan:  Home/Self Care  In-House Referral:     Discharge planning Services  CM Consult  Post Acute Care Choice:    Choice offered to:     DME Arranged:    DME Agency:     HH Arranged:  Patient Refused [place] Agency:     Status of Service:  Completed, signed off  If discussed at [employer] of Stay Meetings, dates discussed:    Additional Comments:  Kathyrn Drown Sherisse Fullilove, RN 10/07/2018, 1:02 PM

## 2018-10-07 NOTE — Op Note (Signed)
[facility] Patient Name: Matthew [name] Procedure Date: 10/07/2018 8:03 AM MRN: [id number] Account #: [id number] Date of Birth: 1936-02-13 Admit Type: Inpatient Age: 82 Room: [facility] ENDO ROOM 4 Gender: Male Note Status: Finalized Procedure:            Upper GI endoscopy Indications:          Melena, Recent gastrointestinal bleeding Providers:            Lin Landsman MD, MD Referring MD:         Barry Dienes (Referring MD) Medicines:            Monitored Anesthesia Care Complications:        No immediate complications. Estimated blood loss: None. Procedure:            Pre-Anesthesia Assessment:                       - Prior to the procedure, a History and Physical was                        performed, and patient medications and allergies were                        reviewed. The patient is competent. The risks and                        benefits of the procedure and the sedation options and                        risks were discussed with the patient. All questions                        were answered and informed consent was obtained.                        Patient identification and proposed procedure were                        verified by the physician, the nurse, the                        anesthesiologist, the anesthetist and the technician in                        the pre-procedure area in the procedure room in the                        endoscopy suite. Mental Status Examination: alert and                        oriented. Airway Examination: normal oropharyngeal                        airway and neck mobility. Respiratory Examination:                        clear to auscultation. CV Examination: normal.                        Prophylactic Antibiotics: The patient does not require  prophylactic antibiotics. Prior Anticoagulants: The                        patient has taken Plavix (clopidogrel), last dose was 5                     days prior to procedure. ASA Grade Assessment: III - A                        patient with severe systemic disease. After reviewing                        the risks and benefits, the patient was deemed in                        satisfactory condition to undergo the procedure. The                        anesthesia plan was to use monitored anesthesia care                        (MAC). Immediately prior to administration of                        medications, the patient was re-assessed for adequacy                        to receive sedatives. The heart rate, respiratory rate,                        oxygen saturations, blood pressure, adequacy of                        pulmonary ventilation, and response to care were                        monitored throughout the procedure. The physical status                        of the patient was re-assessed after the procedure.                       After obtaining informed consent, the endoscope was                        passed under direct vision. Throughout the procedure,                        the patient's blood pressure, pulse, and oxygen                        saturations were monitored continuously. The Endoscope                        was introduced through the mouth, and advanced to the                        second part of duodenum. The upper GI endoscopy was  accomplished without difficulty. The patient tolerated                        the procedure fairly well. Findings:      The duodenal bulb and second portion of the duodenum were normal.      A 2 cm hiatal hernia was present.      Four 5 to 7 mm no bleeding angioectasias were found in the cardia       (hernial sac) and in the gastric fundus (on retroflexion). Coagulation       for hemostasis using argon plasma was successful. For hemostasis, two       hemostatic clips were successfully placed (MR conditional) on one AVM in       cardia. There  was no bleeding at the end of the procedure.      The gastroesophageal junction and examined esophagus were normal. Impression:           - Normal duodenal bulb and second portion of the                        duodenum.                       - 2 cm hiatal hernia.                       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                        Treated with argon plasma coagulation (APC). Clips (MR                        conditional) were placed.                       - Normal gastroesophageal junction and esophagus.                       - No specimens collected. Recommendation:       - Proceed with colonoscopy as scheduled                       - See colonoscopy report Procedure Code(s):    --- Professional ---                       [phone], Esophagogastroduodenoscopy, flexible, transoral;                        with control of bleeding, any method Diagnosis Code(s):    --- Professional ---                       K44.9, Diaphragmatic hernia without obstruction or                        gangrene                       K31.819, Angiodysplasia of stomach and duodenum without                        bleeding  K92.1, Melena (includes Hematochezia)                       K92.2, Gastrointestinal hemorrhage, unspecified CPT copyright 2018 [employer] Association. All rights reserved. The codes documented in this report are preliminary and upon coder review may  be revised to meet current compliance requirements. Dr. Ulyess Mort Lin Landsman MD, MD 10/07/2018 8:36:23 AM This report has been signed electronically. Number of Addenda: 0 Note Initiated On: 10/07/2018 8:03 AM      [facility]

## 2018-10-07 NOTE — Op Note (Signed)
[facility] Gastroenterology Patient Name: Matthew [name] Procedure Date: 10/07/2018 8:03 AM MRN: [id number] Account #: [id number] Date of Birth: [birth year] Admit Type: Inpatient Age: 82 Room: [facility] ENDO ROOM 4 Gender: Male Note Status: Finalized Procedure:            Colonoscopy Indications:          Melena, Acute post hemorrhagic anemia, Iron deficiency                        anemia secondary to chronic blood loss Providers:            Lin Landsman MD, MD Referring MD:         Barry Dienes (Referring MD) Medicines:            Monitored Anesthesia Care Complications:        No immediate complications. Estimated blood loss: None. Procedure:            Pre-Anesthesia Assessment:                       - Prior to the procedure, a History and Physical was                        performed, and patient medications and allergies were                        reviewed. The patient is competent. The risks and                        benefits of the procedure and the sedation options and                        risks were discussed with the patient. All questions                        were answered and informed consent was obtained.                        Patient identification and proposed procedure were                        verified by the physician, the nurse, the                        anesthesiologist, the anesthetist and the technician in                        the pre-procedure area in the procedure room in the                        endoscopy suite. Mental Status Examination: alert and                        oriented. Airway Examination: normal oropharyngeal                        airway and neck mobility. Respiratory Examination:                        clear to auscultation. CV Examination: normal.  Prophylactic Antibiotics: The patient does not require                        prophylactic antibiotics. Prior Anticoagulants: The           patient has taken Plavix (clopidogrel), last dose was 5                        days prior to procedure. ASA Grade Assessment: III - A                        patient with severe systemic disease. After reviewing                        the risks and benefits, the patient was deemed in                        satisfactory condition to undergo the procedure. The                        anesthesia plan was to use monitored anesthesia care                        (MAC). Immediately prior to administration of                        medications, the patient was re-assessed for adequacy                        to receive sedatives. The heart rate, respiratory rate,                        oxygen saturations, blood pressure, adequacy of                        pulmonary ventilation, and response to care were                        monitored throughout the procedure. The physical status                        of the patient was re-assessed after the procedure.                       After obtaining informed consent, the colonoscope was                        passed under direct vision. Throughout the procedure,                        the patient's blood pressure, pulse, and oxygen                        saturations were monitored continuously. The                        Colonoscope was introduced through the anus and                        advanced to the the cecum, identified by appendiceal  orifice and ileocecal valve. The colonoscopy was                        performed without difficulty. The patient tolerated the                        procedure well. The quality of the bowel preparation                        was poor. Findings:      The perianal and digital rectal examinations were normal. Pertinent       negatives include normal sphincter tone and no palpable rectal lesions.      A 12 mm polyp was found in the cecum near AO. The polyp was carpet-like       and flat.  Polypectomy was not attempted due to inadequate bowel       preparation and poor endoscopic visualization.      A 10 mm polyp was found in the transverse colon. The polyp was       semi-pedunculated. The polyp was removed with a hot snare. Resection and       retrieval were complete.      Normal mucosa was found in the entire colon.      Copious quantities of semi-liquid stool was found in the entire colon,       precluding visualization.      Multiple diverticula were found in the sigmoid colon. There was no       evidence of diverticular bleeding.      Non-bleeding external and internal hemorrhoids were found during       retroflexion. The hemorrhoids were large. Impression:           - Preparation of the colon was poor.                       - No lower GI source of melena identified                       - One 12 mm polyp in the cecum. Resection not attempted.                       - One 10 mm polyp in the transverse colon, removed with                        a hot snare. Resected and retrieved.                       - Normal mucosa in the entire examined colon.                       - Stool in the entire examined colon.                       - Severe diverticulosis in the sigmoid colon. There was                        no evidence of diverticular bleeding.                       - Non-bleeding external and internal hemorrhoids. Recommendation:       -  Return patient to hospital ward for ongoing care.                       - Cardiac diet and low sodium diet today.                       - Continue present medications.                       - Await pathology results.                       - Repeat colonoscopy within 3 months because the bowel                        preparation was suboptimal to remove cecal polyp. He                        will need EMR                       - Return to my office in 2 weeks after discharge.                       - Continue iron and B12 Procedure  Code(s):    --- Professional ---                       [phone], Colonoscopy, flexible; with removal of tumor(s),                        polyp(s), or other lesion(s) by snare technique Diagnosis Code(s):    --- Professional ---                       K64.8, Other hemorrhoids                       D12.0, Benign neoplasm of cecum                       D12.3, Benign neoplasm of transverse colon (hepatic                        flexure or splenic flexure)                       K92.1, Melena (includes Hematochezia)                       D62, Acute posthemorrhagic anemia                       D50.0, Iron deficiency anemia secondary to blood loss                        (chronic)                       K57.30, Diverticulosis of large intestine without                        perforation or abscess without bleeding CPT copyright 2018 American Medical Association. All rights reserved. The codes documented in this report are preliminary  and upon coder review may  be revised to meet current compliance requirements. Dr. Ulyess Mort Lin Landsman MD, MD 10/07/2018 8:57:43 AM This report has been signed electronically. Number of Addenda: 0 Note Initiated On: 10/07/2018 8:03 AM Scope Withdrawal Time: 0 hours 6 minutes 38 seconds  Total Procedure Duration: 0 hours 13 minutes 2 seconds       [facility]

## 2018-10-07 NOTE — Anesthesia Postprocedure Evaluation (Signed)
Anesthesia Post Note  Patient: Matthew [name]  Procedure(s) Performed: ESOPHAGOGASTRODUODENOSCOPY (EGD) WITH PROPOFOL (N/A ) COLONOSCOPY WITH PROPOFOL (N/A )  Patient location during evaluation: PACU Anesthesia Type: General Level of consciousness: awake and alert Pain management: pain level controlled Vital Signs Assessment: post-procedure vital signs reviewed and stable Respiratory status: spontaneous breathing, nonlabored ventilation, respiratory function stable and patient connected to nasal cannula oxygen Cardiovascular status: blood pressure returned to baseline and stable Postop Assessment: no apparent nausea or vomiting Anesthetic complications: no     Last Vitals:  Vitals:   10/07/18 1005 10/07/18 1007  BP: (!) 136/44   Pulse: (!) 50   Resp: 18   Temp: 36.6 C   SpO2: 99% 96%    Last Pain:  Vitals:   10/07/18 1005  TempSrc: Oral  PainSc:                  Precious Haws Itati Brocksmith

## 2018-10-07 NOTE — Discharge Summary (Signed)
[place] [facility] [place] NAME: Matthew [name]    MR#:  [id number]  DATE OF BIRTH:  1936-11-19  DATE OF ADMISSION:  10/01/2018 ADMITTING PHYSICIAN: Nicholes Mango, MD  DATE OF DISCHARGE: 10/07/2018  PRIMARY CARE PHYSICIAN: Barry Dienes, NP    ADMISSION DIAGNOSIS:  Anemia due to acute blood loss [D62] NSTEMI (non-ST elevated myocardial infarction) ([place]) [I21.4]  DISCHARGE DIAGNOSIS:  Active Problems:   Symptomatic anemia   SECONDARY DIAGNOSIS:   Past Medical History:  Diagnosis Date  . AS (aortic stenosis)    a. Echo 6/10: EF 55% mild AS; b. echo 06/2015; EF 55-60%, GR1DD, moderate AS, Peak velocity (S): 346 cm/s. Mean gradientS): 28 mm Hg. Peak gradient (S): 48 mm Hg. Valve area (VTI): 1.18 cm2;   c. Echo 4/17 - mild LVH, EF 55-60%, no RWMA, Gr 1 DD, mod to severe AS (mean 28 mmHg, peak 46 mmHg), mild LAE  . CAD (coronary artery disease)    a. MI 1996 w/ CABG 1996; b.redo CABG 06/2003; c. Myoview 7/09: EF 54% inferobasal infarct, no ischemia. Myoview 6/10 EF 43% inf wall infarct. no ischemia; c. cath 8/16 s/p DES to VG-OM2. OTW 3VD w/ patent VG->RPDA, VG->OM3,  & LIMA->LAD.  EF 55-65%.  . Carotid bruit    2009 0-39% on dopplers bilatrally  . GERD (gastroesophageal reflux disease)   . History of kidney stones   . HOH (hard of hearing)   . HTN (hypertension)   . Hyperlipidemia   . Myocardial infarction ([place]) 1996  . Nephrolithiasis   . OSA (obstructive sleep apnea)   . PAD (peripheral artery disease) ([place]) 02/2014   Subtotal occlusion of right common iliac artery and 70% stenosis in the left common iliac artery. Status post bilateral kissing stent placement. 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 Shortness of breath    with exertion  . Thrombocytopenia [facility])     HOSPITAL  COURSE:  82 year old male with past medical history of coronary artery disease status post bypass and stent placement, hypertension, hyperlipidemia, obstructive sleep apnea, GERD, aortic stenosis who presents to the hospital due to shortness of breath and chest pain.  1.  Elevated troponin with shortness of breath/chest pain due to demand ischemia from worsening anemia.   He was evalualted by cardiology and has ruled out for ACS.    2.  Suspected upper GI bleed with acute blood loss anemia EGD showed normal duodenal bulb and second portion of the duodenum.  There were 4 nonbleeding angiectasia is in the stomach which were treated with argon plasma coagulation and clips were placed.  Recommendations are to follow-up with GI in 2 weeks.  He also underwent colonoscopy which showed polyps that were biopsied.    3.  Acute blood loss anemia secondary to the GI bleed.  Patient received 2 unit of packed red blood cells hemoglobin has remained stable. Iron studies consistent with Iron Deficiency and given IV iron.  He will follow-up with hematology in 2 weeks.  4.  History of coronary artery disease status post bypass: Continue atorvastatin, isosorbide , lisinopril carvedilol.   Resume aspirin and Plavix tomorrow.  This was discussed with him and patient's family at bedside today on the day of discharge.    5.  Essential hypertension: Continue Isordil, carvedilol, lisinopril.  6.  BPH :Cocontinue Flomax.  7.  Hyperlipidemia: Continue atorvastatin.    DISCHARGE CONDITIONS AND DIET:   Stable for discharge on heart healthy diet  CONSULTS OBTAINED:  Treatment Team:  [name], MD  DRUG ALLERGIES:   Allergies  Allergen Reactions  . Ranexa [Ranolazine Er] Rash    DISCHARGE MEDICATIONS:   Allergies as of 10/07/2018      Reactions   Ranexa [ranolazine Er] Rash      Medication List    TAKE these medications   acetaminophen 325 MG tablet Commonly known as:  TYLENOL Take 650  mg by mouth daily as needed for mild pain or headache.   aspirin EC 81 MG tablet Take 1 tablet (81 mg total) by mouth daily.   atorvastatin 80 MG tablet Commonly known as:  LIPITOR Take 1 tablet (80 mg total) by mouth daily.   carvedilol 3.125 MG tablet Commonly known as:  COREG Take 1 tablet (3.125 mg total) by mouth 2 (two) times daily.   clopidogrel 75 MG tablet Commonly known as:  PLAVIX Take 1 tablet (75 mg total) by mouth daily with breakfast.   ferrous sulfate 325 (65 FE) MG tablet Take 325 mg by mouth. Takes 3-4 times daily.   FISH OIL PO Take by mouth daily.   gabapentin 100 MG capsule Commonly known as:  NEURONTIN Take 1-3 capsules (100-300 mg total) by mouth 4 (four) times daily. Follow written titration schedule.   GARLIC PO Take by mouth daily.   isosorbide dinitrate 10 MG tablet Commonly known as:  ISORDIL Take 2 tablets (20 mg total) by mouth every morning. Take 10 mg by mouth in the afternoon What changed:    how much to take  when to take this  additional instructions   lisinopril 2.5 MG tablet Commonly known as:  PRINIVIL,ZESTRIL Take 1 tablet (2.5 mg total) by mouth daily.   nitroGLYCERIN 0.4 MG SL tablet Commonly known as:  NITROSTAT Place 0.4 mg under the tongue every 5 (five) minutes as needed for chest pain.   pantoprazole 40 MG tablet Commonly known as:  PROTONIX Take 1 tablet (40 mg total) by mouth daily.   PRESERVISION AREDS PO Take 1 tablet by mouth daily.   tamsulosin 0.4 MG Caps capsule Commonly known as:  FLOMAX Take 1 capsule (0.4 mg total) by mouth daily.   traMADol 50 MG tablet Commonly known as:  ULTRAM Take 1 tablet (50 mg total) daily by mouth. What changed:    when to take this  reasons to take this   TURMERIC PO Take at bedtime by mouth.         Today   CHIEF COMPLAINT:  No acute events overnight patient is ready to be discharged today   VITAL SIGNS:  Blood pressure (!) 136/44, pulse (!) 50,  temperature 97.9 F (36.6 C), temperature source Oral, resp. rate 18, height 5\' 9"  (1.753 m), weight 86.8 kg, SpO2 96 %.   REVIEW OF SYSTEMS:  Review of Systems  Constitutional: Negative.  Negative for chills, fever and malaise/fatigue.  HENT: Negative.  Negative for ear discharge, ear pain, hearing loss, nosebleeds and sore throat.   Eyes: Negative.  Negative for blurred vision and pain.  Respiratory: Negative.  Negative for cough, hemoptysis, shortness of breath and wheezing.   Cardiovascular: Negative.  Negative for chest pain, palpitations and leg swelling.  Gastrointestinal: Negative.  Negative for abdominal pain, blood in stool, diarrhea, nausea and vomiting.  Genitourinary: Negative.  Negative for dysuria.  Musculoskeletal: Negative.  Negative for back  pain.  Skin: Negative.   Neurological: Negative for dizziness, tremors, speech change, focal weakness, seizures and headaches.  Endo/Heme/Allergies: Negative.  Does not bruise/bleed easily.  Psychiatric/Behavioral: Negative.  Negative for depression, hallucinations and suicidal ideas.     PHYSICAL EXAMINATION:  GENERAL:  82 y.o.-year-old patient lying in the bed with no acute distress.  NECK:  Supple, no jugular venous distention. No thyroid enlargement, no tenderness.  LUNGS: Normal breath sounds bilaterally, no wheezing, rales,rhonchi  No use of accessory muscles of respiration.  CARDIOVASCULAR: S1, S2 normal. No murmurs, rubs, or gallops.  ABDOMEN: Soft, non-tender, non-distended. Bowel sounds present. No organomegaly or mass.  EXTREMITIES: No pedal edema, cyanosis, or clubbing.  PSYCHIATRIC: The patient is alert and oriented x 3.  SKIN: No obvious rash, lesion, or ulcer.   DATA REVIEW:   CBC Recent Labs  Lab 10/04/18 0515  10/07/18 0309  WBC 4.5  --   --   HGB 8.5*   < > 8.9*  HCT 27.7*  --   --   PLT 107*  --   --    < > = values in this interval not displayed.    Chemistries  Recent Labs  Lab 10/02/18 0847   NA 142  K 3.6  CL 112*  CO2 26  GLUCOSE 144*  BUN 15  CREATININE 1.15  CALCIUM 8.3*    Cardiac Enzymes Recent Labs  Lab 10/01/18 1850 10/02/18 0242 10/02/18 0847  TROPONINI 0.65* 0.91* 0.82*    Microbiology Results  @MICRORSLT48 @  RADIOLOGY:  No results found.    Allergies as of 10/07/2018      Reactions   Ranexa [ranolazine Er] Rash      Medication List    TAKE these medications   acetaminophen 325 MG tablet Commonly known as:  TYLENOL Take 650 mg by mouth daily as needed for mild pain or headache.   aspirin EC 81 MG tablet Take 1 tablet (81 mg total) by mouth daily.   atorvastatin 80 MG tablet Commonly known as:  LIPITOR Take 1 tablet (80 mg total) by mouth daily.   carvedilol 3.125 MG tablet Commonly known as:  COREG Take 1 tablet (3.125 mg total) by mouth 2 (two) times daily.   clopidogrel 75 MG tablet Commonly known as:  PLAVIX Take 1 tablet (75 mg total) by mouth daily with breakfast.   ferrous sulfate 325 (65 FE) MG tablet Take 325 mg by mouth. Takes 3-4 times daily.   FISH OIL PO Take by mouth daily.   gabapentin 100 MG capsule Commonly known as:  NEURONTIN Take 1-3 capsules (100-300 mg total) by mouth 4 (four) times daily. Follow written titration schedule.   GARLIC PO Take by mouth daily.   isosorbide dinitrate 10 MG tablet Commonly known as:  ISORDIL Take 2 tablets (20 mg total) by mouth every morning. Take 10 mg by mouth in the afternoon What changed:    how much to take  when to take this  additional instructions   lisinopril 2.5 MG tablet Commonly known as:  PRINIVIL,ZESTRIL Take 1 tablet (2.5 mg total) by mouth daily.   nitroGLYCERIN 0.4 MG SL tablet Commonly known as:  NITROSTAT Place 0.4 mg under the tongue every 5 (five) minutes as needed for chest pain.   pantoprazole 40 MG tablet Commonly known as:  PROTONIX Take 1 tablet (40 mg total) by mouth daily.   PRESERVISION AREDS PO Take 1 tablet by mouth  daily.   tamsulosin 0.4 MG Caps capsule Commonly known  as:  FLOMAX Take 1 capsule (0.4 mg total) by mouth daily.   traMADol 50 MG tablet Commonly known as:  ULTRAM Take 1 tablet (50 mg total) daily by mouth. What changed:    when to take this  reasons to take this   TURMERIC PO Take at bedtime by mouth.        }   Management plans discussed with the patient and he is in agreement. Stable for discharge   Patient should follow up with pcp  CODE STATUS:     Code Status Orders  (From admission, onward)         Start     Ordered   10/01/18 2119  Full code  Continuous     10/01/18 2118        Code Status History    Date Active Date Inactive Code Status Order ID Comments User Context   06/01/2016 1723 06/03/2016 1404 Full Code [id number]  Sherren Mocha, MD Inpatient   07/31/2015 1017 08/01/2015 1417 Full Code [id number]  Wellington Hampshire, MD Inpatient   03/06/2014 1302 03/06/2014 2004 Full Code [id number]  Wellington Hampshire, MD Inpatient      TOTAL TIME TAKING CARE OF THIS PATIENT: 38 minutes.    Note: This dictation was prepared with Dragon dictation along with smaller phrase technology. Any transcriptional errors that result from this process are unintentional.  Mancil Pfenning M.D on 10/07/2018 at 10:41 AM  Between 7am to 6pm - Pager - [phone] After 6pm go to [email or web address] - password [email or web address] [facility]  Office  [phone]  CC: Primary care physician; Barry Dienes, NP

## 2018-10-07 NOTE — Anesthesia Procedure Notes (Signed)
Date/Time: 10/07/2018 8:06 AM Performed by: Johnna Acosta, CRNA Pre-anesthesia Checklist: Patient identified, Emergency Drugs available, Suction available, Patient being monitored and Timeout performed Patient Re-evaluated:Patient Re-evaluated prior to induction Oxygen Delivery Method: Nasal cannula Preoxygenation: Pre-oxygenation with 100% oxygen Induction Type: IV induction

## 2018-10-07 NOTE — Transfer of Care (Signed)
Immediate Anesthesia Transfer of Care Note  Patient: Matthew [name]  Procedure(s) Performed: ESOPHAGOGASTRODUODENOSCOPY (EGD) WITH PROPOFOL (N/A ) COLONOSCOPY WITH PROPOFOL (N/A )  Patient Location: PACU  Anesthesia Type:General  Level of Consciousness: awake and alert   Airway & Oxygen Therapy: Patient Spontanous Breathing and Patient connected to nasal cannula oxygen  Post-op Assessment: Report given to RN and Post -op Vital signs reviewed and stable  Post vital signs: Reviewed and stable  Last Vitals:  Vitals Value Taken Time  BP 96/59 10/07/2018  9:04 AM  Temp 36.4 C 10/07/2018  9:04 AM  Pulse 55 10/07/2018  9:10 AM  Resp 18 10/07/2018  9:10 AM  SpO2 96 % 10/07/2018  9:10 AM  Vitals shown include unvalidated device data.  Last Pain:  Vitals:   10/07/18 0904  TempSrc:   PainSc: Asleep         Complications: No apparent anesthesia complications

## 2018-10-07 NOTE — Anesthesia Post-op Follow-up Note (Signed)
Anesthesia QCDR form completed.        

## 2018-10-09 ENCOUNTER — Telehealth: Payer: Self-pay | Admitting: Gastroenterology

## 2018-10-09 NOTE — Telephone Encounter (Signed)
-----   Message from Lin Landsman, MD sent at 10/07/2018  9:52 AM EDT ----- Regarding: hospital f/u With me in 2-3weeks. Ok to [place]  [facility]

## 2018-10-09 NOTE — Telephone Encounter (Signed)
Left vm for pt to call office and schedule 2-3 week f/u to see Dr. Marius Ditch

## 2018-10-10 ENCOUNTER — Encounter: Payer: Self-pay | Admitting: Gastroenterology

## 2018-10-10 LAB — SURGICAL PATHOLOGY

## 2018-10-11 ENCOUNTER — Other Ambulatory Visit: Payer: Self-pay

## 2018-10-11 DIAGNOSIS — Z86010 Personal history of colonic polyps: Secondary | ICD-10-CM

## 2018-10-13 ENCOUNTER — Telehealth: Payer: Self-pay

## 2018-10-13 NOTE — Telephone Encounter (Signed)
Flagged on EMMI report for not knowing who to call about changes in condition.  First attempt to reach patient made 10/13/18 at 1:15pm, however unable to reach patient.  Left voicemail encouraging callback. Will attempt at later time.

## 2018-10-18 ENCOUNTER — Ambulatory Visit: Payer: Medicare HMO | Admitting: Nurse Practitioner

## 2018-10-18 NOTE — Telephone Encounter (Signed)
Second attempt made, however unable to reach patient.  Left another voicemail encouraging callback for any questions or concerns.

## 2018-10-26 ENCOUNTER — Encounter: Payer: Self-pay | Admitting: Oncology

## 2018-10-26 ENCOUNTER — Other Ambulatory Visit: Payer: Self-pay

## 2018-10-26 ENCOUNTER — Inpatient Hospital Stay: Payer: Medicare HMO | Attending: Oncology | Admitting: Oncology

## 2018-10-26 ENCOUNTER — Encounter: Payer: Self-pay | Admitting: Gastroenterology

## 2018-10-26 ENCOUNTER — Ambulatory Visit: Payer: Medicare HMO | Admitting: Gastroenterology

## 2018-10-26 VITALS — BP 121/61 | HR 63 | Resp 16 | Ht 69.0 in | Wt 191.2 lb

## 2018-10-26 VITALS — BP 127/62 | HR 65 | Temp 97.7°F | Resp 18 | Ht 69.0 in | Wt 191.5 lb

## 2018-10-26 DIAGNOSIS — Z86010 Personal history of colonic polyps: Secondary | ICD-10-CM

## 2018-10-26 DIAGNOSIS — D500 Iron deficiency anemia secondary to blood loss (chronic): Secondary | ICD-10-CM

## 2018-10-26 DIAGNOSIS — E538 Deficiency of other specified B group vitamins: Secondary | ICD-10-CM

## 2018-10-26 DIAGNOSIS — D509 Iron deficiency anemia, unspecified: Secondary | ICD-10-CM | POA: Insufficient documentation

## 2018-10-26 DIAGNOSIS — K31819 Angiodysplasia of stomach and duodenum without bleeding: Secondary | ICD-10-CM

## 2018-10-26 DIAGNOSIS — Q2733 Arteriovenous malformation of digestive system vessel: Secondary | ICD-10-CM | POA: Insufficient documentation

## 2018-10-26 NOTE — Progress Notes (Signed)
Matthew Darby, MD [address]  [place]  [place], [place] [address]  Main: [phone]  Fax: [phone]    Gastroenterology Consultation  Referring Provider:     Barry Dienes, NP Primary Care Physician:  Barry Dienes, NP Primary Gastroenterologist:  Dr. Cephas [name] Reason for Consultation:     Iron deficiency anemia, hospital follow-up        HPI:   SANJIT MCMICHAEL is a 82 y.o. male referred by Dr. Barry Dienes, NP  for consultation & management of iron deficiency anemia secondary to upper GI bleed.  Patient was admitted to [facility] on 10/02/2018 secondary to acute symptomatic anemia with melena, hemoglobin dropped from 10.6 to 7.  Patient has been taking aspirin, Plavix as well as iron supplementation in the past.  Patient was found to have severe iron deficiency.  Recent blood transition, underwent EGD and colonoscopy.  Upper endoscopy revealed several gastric nonbleeding AVMs which were treated with APC, 2 cm hiatal hernia.  Colonoscopy was unremarkable for lower GI source of GI bleed.  He was incidentally found to have a large flat polyp in the cecum that was not resected due to poor prep.  Another polyp in the transverse colon was removed, it was tubular adenoma.  Patient received IV iron, also started on B12 due to severe B12 deficiency.  He is here as a hospital follow-up  Interval summary: Patient is taking oral iron 3 pills daily and his stools are dark but formed.  He reports feeling well since discharge.  He denies chest pain, shortness of breath, lightheadedness.  He is also taking oral B12 daily.  He had follow-up labs done at his PCPs office last week.  Hemoglobin is improving and iron level was normal.  NSAIDs: None  Antiplts/Anticoagulants/Anti thrombotics: Aspirin and Plavix for coronary disease and aortic stenosis  GI Procedures:  EGD 10/07/2018 - Normal duodenal bulb and second portion of the duodenum. - 2 cm hiatal hernia. - Four non-bleeding angioectasias  in the stomach. Treated with argon plasma coagulation (APC). Clips (MR conditional) were placed. - Normal gastroesophageal junction and esophagus. - No specimens collected.  Colonoscopy 10/07/2018 - Preparation of the colon was poor. - No lower GI source of melena identified - One 12 mm polyp in the cecum. Resection not attempted. - One 10 mm polyp in the transverse colon, removed with a hot snare. Resected and retrieved. - Normal mucosa in the entire examined colon. - Stool in the entire examined colon. - Severe diverticulosis in the sigmoid colon. There was no evidence of diverticular bleeding. - Non-bleeding external and internal hemorrhoids. DIAGNOSIS:  A. COLON POLYP, TRANSVERSE; HOT SNARE:  - TUBULAR ADENOMA.  - NEGATIVE FOR HIGH-GRADE DYSPLASIA AND MALIGNANCY.   Past Medical History:  Diagnosis Date  . AS (aortic stenosis)    a. Echo 6/10: EF 55% mild AS; b. echo 06/2015; EF 55-60%, GR1DD, moderate AS, Peak velocity (S): 346 cm/s. Mean gradientS): 28 mm Hg. Peak gradient (S): 48 mm Hg. Valve area (VTI): 1.18 cm2;   c. Echo 4/17 - mild LVH, EF 55-60%, no RWMA, Gr 1 DD, mod to severe AS (mean 28 mmHg, peak 46 mmHg), mild LAE  . CAD (coronary artery disease)    a. MI 1996 w/ CABG 1996; b.redo CABG 06/2003; c. Myoview 7/09: EF 54% inferobasal infarct, no ischemia. Myoview 6/10 EF 43% inf wall infarct. no ischemia; c. cath 8/16 s/p DES to VG-OM2. OTW 3VD w/ patent VG->RPDA, VG->OM3,  & LIMA->LAD.  EF 55-65%.  . Carotid bruit    2009 0-39% on dopplers bilatrally  . GERD (gastroesophageal reflux disease)   . History of kidney stones   . HOH (hard of hearing)   . HTN (hypertension)   . Hyperlipidemia   . Myocardial infarction ([place]) 1996  . Nephrolithiasis   . OSA (obstructive sleep apnea)   . PAD (peripheral artery disease) ([place]) 02/2014   Subtotal occlusion of right common iliac artery and 70% stenosis in the left common iliac artery. Status post bilateral kissing stent placement.  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 Shortness of breath    with exertion  . Thrombocytopenia ([place])     Past Surgical History:  Procedure Laterality Date  . ABDOMINAL AORTAGRAM N/A 03/06/2014   Procedure: ABDOMINAL Maxcine Ham;  Surgeon: Wellington Hampshire, MD;  Location: [place] [facility] CATH LAB;  Service: Cardiovascular;  Laterality: N/A;  . CARDIAC CATHETERIZATION  02/2014   Severe three-vessel coronary artery disease with patent grafts.   Marland Kitchen CARDIAC CATHETERIZATION N/A 07/31/2015   Procedure: Right/Left Heart Cath and Coronary/Graft Angiography;  Surgeon: Wellington Hampshire, MD;  Location: [place] CV LAB;  Service: Cardiovascular;  Laterality: N/A;  . CARDIAC CATHETERIZATION N/A 07/31/2015   Procedure: Coronary Stent Intervention;  Surgeon: Wellington Hampshire, MD;  Location: [place] [facility] LAB;  Service: Cardiovascular;  Laterality: N/A;  . CARDIAC CATHETERIZATION N/A 05/26/2016   Procedure: Right/Left Heart Cath and Coronary Angiography;  Surgeon: Wellington Hampshire, MD;  Location: [place] CV LAB;  Service: Cardiovascular;  Laterality: N/A;  . COLONOSCOPY WITH PROPOFOL N/A 10/07/2018   Procedure: COLONOSCOPY WITH PROPOFOL;  Surgeon: Lin Landsman, MD;  Location[facility] ENDOSCOPY;  Service: Gastroenterology;  Laterality: N/A;  . CORONARY ARTERY BYPASS GRAFT  1991   at [facility]. Redone 2004-3 vessels 1st time and 4 second time  . CYSTO     2/3 times for kidney stones  . ESOPHAGOGASTRODUODENOSCOPY (EGD) WITH PROPOFOL N/A 10/07/2018   Procedure: ESOPHAGOGASTRODUODENOSCOPY (EGD) WITH PROPOFOL;  Surgeon: Lin Landsman, MD;  Location: [place];  Service: Gastroenterology;  Laterality: N/A;  . INGUINAL HERNIA REPAIR  11/10/2011   Procedure: HERNIA REPAIR INGUINAL ADULT;  Surgeon: Donato Heinz;  Location: [facility];  Service:  General;  Laterality: Right;  . LEFT HEART CATH AND CORONARY ANGIOGRAPHY Left 05/15/2018   Procedure: LEFT HEART CATH AND CORONARY ANGIOGRAPHY;  Surgeon: Wellington Hampshire, MD;  Location: [place] CV LAB;  Service: Cardiovascular;  Laterality: Left;  . LEFT HEART CATHETERIZATION WITH CORONARY/GRAFT ANGIOGRAM N/A 03/06/2014   Procedure: LEFT HEART CATHETERIZATION WITH Beatrix Fetters;  Surgeon: Wellington Hampshire, MD;  Location: [facility] CATH [facility];  Service: Cardiovascular;  Laterality: N/A;  . STOMACH SURGERY     removal of gastric ulcers  . TEE WITHOUT CARDIOVERSION N/A 06/01/2016   Procedure: TRANSESOPHAGEAL ECHOCARDIOGRAM (TEE);  Surgeon: Sherren Mocha, MD;  Location: [place];  Service: Open Heart Surgery;  Laterality: N/A;  . TRANSCATHETER AORTIC VALVE REPLACEMENT, TRANSFEMORAL N/A 06/01/2016   Procedure: TRANSCATHETER AORTIC VALVE REPLACEMENT, TRANSFEMORAL;  Surgeon: Sherren Mocha, MD;  Location: [place];  Service: Open Heart Surgery;  Laterality: N/A;    Current Outpatient Medications:  .  acetaminophen (TYLENOL) 325 MG tablet, Take 650 mg by mouth daily as needed for mild pain or headache. , Disp: , Rfl:  .  aspirin EC 81 MG  tablet, Take 1 tablet (81 mg total) by mouth daily., Disp: 90 tablet, Rfl: 3 .  atorvastatin (LIPITOR) 80 MG tablet, Take 1 tablet (80 mg total) by mouth daily., Disp: 90 tablet, Rfl: 1 .  carvedilol (COREG) 3.125 MG tablet, Take 1 tablet (3.125 mg total) by mouth 2 (two) times daily., Disp: 180 tablet, Rfl: 1 .  clopidogrel (PLAVIX) 75 MG tablet, Take 1 tablet (75 mg total) by mouth daily with breakfast., Disp: 90 tablet, Rfl: 1 .  ferrous sulfate 325 (65 FE) MG tablet, Take 325 mg by mouth. Takes 3-4 times daily., Disp: , Rfl:  .  GARLIC PO, Take by mouth daily., Disp: , Rfl:  .  isosorbide dinitrate (ISORDIL) 10 MG tablet, Take 2 tablets (20 mg total) by mouth every morning. Take 10 mg by mouth in the afternoon (Patient taking differently: Take 10 mg by mouth 2 (two)  times daily. Take 20mg  in the morning and 10 mg by mouth in the afternoon), Disp: 270 tablet, Rfl: 3 .  lisinopril (PRINIVIL,ZESTRIL) 2.5 MG tablet, Take 1 tablet (2.5 mg total) by mouth daily., Disp: 90 tablet, Rfl: 3 .  Multiple Vitamins-Minerals (PRESERVISION AREDS PO), Take 1 tablet by mouth daily. , Disp: , Rfl:  .  nitroGLYCERIN (NITROSTAT) 0.4 MG SL tablet, Place 0.4 mg under the tongue every 5 (five) minutes as needed for chest pain., Disp: , Rfl:  .  Omega-3 Fatty Acids (FISH OIL PO), Take by mouth daily., Disp: , Rfl:  .  pantoprazole (PROTONIX) 40 MG tablet, Take 1 tablet (40 mg total) by mouth daily., Disp: 90 tablet, Rfl: 3 .  tamsulosin (FLOMAX) 0.4 MG CAPS capsule, Take 1 capsule (0.4 mg total) by mouth daily., Disp: 90 capsule, Rfl: 3 .  TURMERIC PO, Take at bedtime by mouth., Disp: , Rfl:  .  gabapentin (NEURONTIN) 100 MG capsule, Take 1-3 capsules (100-300 mg total) by mouth 4 (four) times daily. Follow written titration schedule., Disp: 360 capsule, Rfl: 2 .  hydrochlorothiazide (HYDRODIURIL) 25 MG tablet, , Disp: , Rfl:  .  traMADol (ULTRAM) 50 MG tablet, Take 1 tablet (50 mg total) daily by mouth. (Patient taking differently: Take 50 mg by mouth daily as needed. ), Disp: 90 tablet, Rfl: 0    Family History  Problem Relation Age of Onset  . Heart disease Father   . Heart disease Mother      Social History   Tobacco Use  . Smoking status: Former Smoker    Packs/day: 1.00    Years: 50.00    Pack years: 50.00    Types: Cigarettes    Last attempt to quit: 01/24/1990    Years since quitting: 28.7  . Smokeless tobacco: Never Used  Substance Use Topics  . Alcohol use: No    Alcohol/week: 0.0 standard drinks    Comment: Alcoholic  . Drug use: No    Allergies as of 10/26/2018 - Review Complete 10/26/2018  Allergen Reaction Noted  . Ranexa [ranolazine er] Rash 07/19/2018    Review of Systems:    All systems reviewed and negative except where noted in HPI.    Physical Exam:  BP 121/61 (BP Location: Left Arm, Patient Position: Sitting, Cuff Size: Large)   Pulse 63   Resp 16   Ht 5\' 9"  (1.753 m)   Wt 191 lb 3.2 oz (86.7 kg)   BMI 28.24 kg/m  No LMP for male patient.  General:   Alert,  Well-developed, well-nourished, pleasant and cooperative in NAD Head:  Normocephalic and atraumatic. Eyes:  Sclera clear, no icterus.   Conjunctiva pink. Ears:  Normal auditory acuity. Nose:  No deformity, discharge, or lesions. Mouth:  No deformity or lesions,oropharynx pink & moist. Neck:  Supple; no masses or thyromegaly. Lungs:  Respirations even and unlabored.  Clear throughout to auscultation.   No wheezes, crackles, or rhonchi. No acute distress. Heart:  Regular rate and rhythm; no murmurs, clicks, rubs, or gallops. Abdomen:  Normal bowel sounds. Soft, non-tender and non-distended without masses, hepatosplenomegaly or hernias noted.  No guarding or rebound tenderness.   Rectal: Not performed Msk:  Symmetrical without gross deformities. Good, equal movement & strength bilaterally. Pulses:  Normal pulses noted. Extremities:  No clubbing or edema.  No cyanosis. Neurologic:  Alert and oriented x3;  grossly normal neurologically. Skin:  Intact without significant lesions or rashes. No jaundice. Lymph Nodes:  No significant cervical adenopathy. Psych:  Alert and cooperative. Normal mood and affect.  Imaging Studies: Reviewed  Assessment and Plan:   ARAD BURSTON is a 82 y.o. Caucasian male with history of aortic stenosis status post aortic valve replacement, coronary artery disease on DAPT, iron deficiency anemia secondary to blood loss from gastric AVMs, underwent EGD for treatment of AVMs.  Colonoscopy revealed 2 polyps, cecal polyp was not removed due to poor prep  Iron deficiency anemia secondary to gastric AVMs Continue oral iron Check labs today Continue B12 daily Okay to continue aspirin and Plavix Recheck labs in 3 months  Cecal  polyp Schedule for colonoscopy in 12/2018 for EMR or polypectomy He will be off Plavix for 5 days prior to the procedure   Follow up in 3 months   Matthew Darby, MD

## 2018-10-27 ENCOUNTER — Telehealth: Payer: Self-pay | Admitting: *Deleted

## 2018-10-27 ENCOUNTER — Encounter: Payer: Self-pay | Admitting: Oncology

## 2018-10-27 LAB — IRON AND TIBC
Iron Saturation: 22 % (ref 15–55)
Iron: 70 ug/dL (ref 38–169)
Total Iron Binding Capacity: 321 ug/dL (ref 250–450)
UIBC: 251 ug/dL (ref 111–343)

## 2018-10-27 LAB — CBC
HEMATOCRIT: 38.1 % (ref 37.5–51.0)
Hemoglobin: 12.4 g/dL — ABNORMAL LOW (ref 13.0–17.7)
MCH: 30.2 pg (ref 26.6–33.0)
MCHC: 32.5 g/dL (ref 31.5–35.7)
MCV: 93 fL (ref 79–97)
PLATELETS: 140 10*3/uL — AB (ref 150–450)
RBC: 4.11 x10E6/uL — ABNORMAL LOW (ref 4.14–5.80)
RDW: 13.7 % (ref 12.3–15.4)
WBC: 7.2 10*3/uL (ref 3.4–10.8)

## 2018-10-27 LAB — VITAMIN B12: VITAMIN B 12: 768 pg/mL (ref 232–1245)

## 2018-10-27 LAB — FERRITIN: FERRITIN: 79 ng/mL (ref 30–400)

## 2018-10-27 NOTE — Progress Notes (Signed)
Hematology/Oncology Consult note [facility] Telephone:([phone] [phone]) [phone]  Patient Care Team: Barry Dienes, NP as PCP - General (Nurse Practitioner) Wellington Hampshire, MD as Consulting Physician (Cardiology) Daneil Dolin, MD as Consulting Physician (Gastroenterology)   Name of the patient: Matthew [name]  [id number]  08-06-1936    Reason for referral- iron deficiency anemia secondary to upper GI bleed   Referring physician- Dr. Marius Ditch  Date of visit: 10/27/18   History of presenting illness-patient is a 82 year old male with past medical history significant for severe aortic stenosis, mild thrombocytopenia among other medical problems.  He was recently admitted to the hospital for acute symptomatic anemia from upper GI bleed when his hemoglobin dropped down from 10.6-7.  Labs indicated severe iron deficiency.  He underwent EGD and colonoscopy.  Upper endoscopy showed several gastric AVMs which were treated with APC.  2 cm hiatal hernia was also noted.  Colonoscopy showed a large flat polyp in the cecum that was not resected due to poor prep.  Patient has seen Dr. Marius Ditch who plans to do a repeat colonoscopy in January 2020.  Patient was also found to have low B12 levels of 128.  Patient received 2 units of PRBC as well as IV iron in the hospital.  He was subsequently discharged on oral B12 and oral iron 3 times a day which he is taking daily and tolerating it well without any significant side effects.  He has occasional constipation and diarrhea.  Denies any abdominal pain.  Denies any bright red blood in rectum.  His stools have been black since he started taking oral iron.  ECOG PS- 1-2  Pain scale- 0   Review of systems- Review of Systems  Constitutional: Positive for malaise/fatigue. Negative for chills, fever and weight loss.  HENT: Negative for congestion, ear discharge and nosebleeds.   Eyes: Negative for blurred vision.  Respiratory: Negative  for cough, hemoptysis, sputum production, shortness of breath and wheezing.   Cardiovascular: Negative for chest pain, palpitations, orthopnea and claudication.  Gastrointestinal: Negative for abdominal pain, blood in stool, constipation, diarrhea, heartburn, melena, nausea and vomiting.  Genitourinary: Negative for dysuria, flank pain, frequency, hematuria and urgency.  Musculoskeletal: Negative for back pain, joint pain and myalgias.  Skin: Negative for rash.  Neurological: Negative for dizziness, tingling, focal weakness, seizures, weakness and headaches.  Endo/Heme/Allergies: Does not bruise/bleed easily.  Psychiatric/Behavioral: Negative for depression and suicidal ideas. The patient does not have insomnia.     Allergies  Allergen Reactions  . Ranexa [Ranolazine Er] Rash    Patient Active Problem List   Diagnosis Date Noted  . Iron deficiency anemia 10/26/2018  . Symptomatic anemia 10/01/2018  . Lumbar spinal stenosis 12/26/2017  . Abnormal MRI, lumbar spine 12/26/2017  . Chronic lumbar radiculopathy 12/26/2017  . Chronic lower extremity radicular pain (S1 dermatome) (Bilateral) (L>R) 11/15/2017  . Long term current use of anticoagulant (Plavix) 09/28/2017  . History of MI (myocardial infarction) (Last 2004) 09/28/2017  . History of alcoholism ([place]) 09/28/2017  . Neurogenic pain 09/28/2017  . Lumbar facet syndrome (Bilateral) (L>R) 09/28/2017  . DDD (degenerative disc disease), lumbar 09/28/2017  . Lumbar facet osteoarthritis 09/28/2017  . Lumbar spondylosis with radiculopathy 09/28/2017  . Gastroesophageal reflux disease without esophagitis 09/28/2017  . Chronic hip pain 09/01/2017  . Long term current use of opiate analgesic 08/31/2017  . Long term prescription opiate use 08/31/2017  . Opiate use 08/31/2017  . Chronic low back pain (Primary Area of Pain) (Bilateral) (  L>R) 08/31/2017  . Chronic lower extremity pain (Bilateral) (L>R) 08/31/2017  . Chronic pain syndrome  08/31/2017  . Chronic sacroiliac joint pain (Bilateral) (L>R) 08/31/2017  . Severe aortic stenosis 05/25/2016  . Thrombocytopenia ([place])   . Coronary artery disease 08/01/2015  . Effort angina ([place]) 07/31/2015  . PAD (peripheral artery disease) ([place]) 03/22/2014  . Angina pectoris ([place]) 02/14/2014  . Aortic valve Replacement 05/01/2009  . Obstructive sleep apnea 03/05/2008  . HLD (hyperlipidemia) 03/04/2008  . Essential hypertension 03/04/2008  . Coronary atherosclerosis 03/04/2008     Past Medical History:  Diagnosis Date  . AS (aortic stenosis)    a. Echo 6/10: EF 55% mild AS; b. echo 06/2015; EF 55-60%, GR1DD, moderate AS, Peak velocity (S): 346 cm/s. Mean gradientS): 28 mm Hg. Peak gradient (S): 48 mm Hg. Valve area (VTI): 1.18 cm2;   c. Echo 4/17 - mild LVH, EF 55-60%, no RWMA, Gr 1 DD, mod to severe AS (mean 28 mmHg, peak 46 mmHg), mild LAE  . CAD (coronary artery disease)    a. MI 1996 w/ CABG 1996; b.redo CABG 06/2003; c. Myoview 7/09: EF 54% inferobasal infarct, no ischemia. Myoview 6/10 EF 43% inf wall infarct. no ischemia; c. cath 8/16 s/p DES to VG-OM2. OTW 3VD w/ patent VG->RPDA, VG->OM3,  & LIMA->LAD.  EF 55-65%.  . Carotid bruit    2009 0-39% on dopplers bilatrally  . Chest pain   . GERD (gastroesophageal reflux disease)   . History of kidney stones   . HOH (hard of hearing)   . HTN (hypertension)   . Hyperlipidemia   . Myocardial infarction ([place]) 1996  . Nephrolithiasis   . OSA (obstructive sleep apnea)   . PAD (peripheral artery disease) ([place]) 02/2014   Subtotal occlusion of right common iliac artery and 70% stenosis in the left common iliac artery. Status post bilateral kissing stent placement. 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 Shortness of breath    with exertion  . Thrombocytopenia ([place])       Past Surgical History:  Procedure Laterality Date  . ABDOMINAL AORTAGRAM N/A 03/06/2014   Procedure: ABDOMINAL Maxcine Ham;  Surgeon: Wellington Hampshire, MD;  Location: [place] CATH LAB;  Service: Cardiovascular;  Laterality: N/A;  . CARDIAC CATHETERIZATION  02/2014   Severe three-vessel coronary artery disease with patent grafts.   Marland Kitchen CARDIAC CATHETERIZATION N/A 07/31/2015   Procedure: Right/Left Heart Cath and Coronary/Graft Angiography;  Surgeon: Wellington Hampshire, MD;  Location: [place] CV LAB;  Service: Cardiovascular;  Laterality: N/A;  . CARDIAC CATHETERIZATION N/A 07/31/2015   Procedure: Coronary Stent Intervention;  Surgeon: Wellington Hampshire, MD;  Location: [place] CV LAB;  Service: Cardiovascular;  Laterality: N/A;  . CARDIAC CATHETERIZATION N/A 05/26/2016   Procedure: Right/Left Heart Cath and Coronary Angiography;  Surgeon: Wellington Hampshire, MD;  Location: [place] CV LAB;  Service: Cardiovascular;  Laterality: N/A;  . COLONOSCOPY WITH PROPOFOL N/A 10/07/2018   Procedure: COLONOSCOPY WITH PROPOFOL;  Surgeon: Lin Landsman, MD;  Location: [facility] ENDOSCOPY;  Service: Gastroenterology;  Laterality: N/A;  . CORONARY ARTERY BYPASS GRAFT  1991   at [facility]. Redone 2004-3 vessels 1st time and 4 second time  . CYSTO     2/3 times for kidney stones  . ESOPHAGOGASTRODUODENOSCOPY (EGD) WITH PROPOFOL N/A 10/07/2018   Procedure: ESOPHAGOGASTRODUODENOSCOPY (EGD) WITH PROPOFOL;  Surgeon:  Lin Landsman, MD;  Location: [facility] ENDOSCOPY;  Service: Gastroenterology;  Laterality: N/A;  . INGUINAL HERNIA REPAIR  11/10/2011   Procedure: HERNIA REPAIR INGUINAL ADULT;  Surgeon: Donato Heinz;  Location: [facility];  Service: General;  Laterality: Right;  . LEFT HEART CATH AND CORONARY ANGIOGRAPHY Left 05/15/2018   Procedure: LEFT HEART CATH AND CORONARY ANGIOGRAPHY;  Surgeon: Wellington Hampshire, MD;  Location: [place] CV LAB;  Service: Cardiovascular;  Laterality: Left;  . LEFT HEART  CATHETERIZATION WITH CORONARY/GRAFT ANGIOGRAM N/A 03/06/2014   Procedure: LEFT HEART CATHETERIZATION WITH Beatrix Fetters;  Surgeon: Wellington Hampshire, MD;  Location: [place] CATH LAB;  Service: Cardiovascular;  Laterality: N/A;  . STOMACH SURGERY     removal of gastric ulcers  . TEE WITHOUT CARDIOVERSION N/A 06/01/2016   Procedure: TRANSESOPHAGEAL ECHOCARDIOGRAM (TEE);  Surgeon: Sherren Mocha, MD;  Location: [place];  Service: Open Heart Surgery;  Laterality: N/A;  . TRANSCATHETER AORTIC VALVE REPLACEMENT, TRANSFEMORAL N/A 06/01/2016   Procedure: TRANSCATHETER AORTIC VALVE REPLACEMENT, TRANSFEMORAL;  Surgeon: Sherren Mocha, MD;  Location: [place];  Service: Open Heart Surgery;  Laterality: N/A;    Social History   Socioeconomic History  . Marital status: Married    Spouse name: Not on file  . Number of children: 2  . Years of education: Not on file  . Highest education level: Not on file  Occupational History  . Occupation: Retired  Scientific laboratory technician  . Financial resource strain: Not on file  . Food insecurity:    Worry: Not on file    Inability: Not on file  . Transportation needs:    Medical: Not on file    Non-medical: Not on file  Tobacco Use  . Smoking status: Former Smoker    Packs/day: 1.00    Years: 50.00    Pack years: 50.00    Types: Cigarettes    Last attempt to quit: 01/24/1990    Years since quitting: 28.7  . Smokeless tobacco: Never Used  Substance and Sexual Activity  . Alcohol use: No    Alcohol/week: 0.0 standard drinks    Comment: Alcoholic  . Drug use: No  . Sexual activity: Never  Lifestyle  . Physical activity:    Days per week: Not on file    Minutes per session: Not on file  . Stress: Not on file  Relationships  . Social connections:    Talks on phone: Not on file    Gets together: Not on file    Attends religious service: Not on file    Active member of club or organization: Not on file    Attends meetings of clubs or organizations: Not on file      Relationship status: Not on file  . Intimate partner violence:    Fear of current or ex partner: Not on file    Emotionally abused: Not on file    Physically abused: Not on file    Forced sexual activity: Not on file  Other Topics Concern  . Not on file  Social History Narrative   Married an lives with wife in [place]. Retired form [employer].      Family History  Problem Relation Age of Onset  . Heart disease Father   . Heart disease Mother      Current Outpatient Medications:  .  aspirin EC 81 MG tablet, Take 1 tablet (81 mg total) by mouth daily., Disp: 90 tablet, Rfl: 3 .  atorvastatin (LIPITOR) 80 MG tablet, Take  1 tablet (80 mg total) by mouth daily., Disp: 90 tablet, Rfl: 1 .  carvedilol (COREG) 3.125 MG tablet, Take 1 tablet (3.125 mg total) by mouth 2 (two) times daily., Disp: 180 tablet, Rfl: 1 .  clopidogrel (PLAVIX) 75 MG tablet, Take 1 tablet (75 mg total) by mouth daily with breakfast., Disp: 90 tablet, Rfl: 1 .  ferrous sulfate 325 (65 FE) MG tablet, Take 325 mg by mouth. Takes 3-4 times daily., Disp: , Rfl:  .  gabapentin (NEURONTIN) 100 MG capsule, Take 1-3 capsules (100-300 mg total) by mouth 4 (four) times daily. Follow written titration schedule., Disp: 360 capsule, Rfl: 2 .  GARLIC PO, Take by mouth daily., Disp: , Rfl:  .  isosorbide dinitrate (ISORDIL) 10 MG tablet, Take 2 tablets (20 mg total) by mouth every morning. Take 10 mg by mouth in the afternoon (Patient taking differently: Take 10 mg by mouth 2 (two) times daily. Take 20mg  in the morning and 10 mg by mouth in the afternoon), Disp: 270 tablet, Rfl: 3 .  Multiple Vitamins-Minerals (PRESERVISION AREDS PO), Take 1 tablet by mouth daily. , Disp: , Rfl:  .  Omega-3 Fatty Acids (FISH OIL PO), Take by mouth daily., Disp: , Rfl:  .  pantoprazole (PROTONIX) 40 MG tablet, Take 1 tablet (40 mg total) by mouth daily., Disp: 90 tablet, Rfl: 3 .  tamsulosin (FLOMAX) 0.4 MG CAPS capsule, Take 1 capsule  (0.4 mg total) by mouth daily., Disp: 90 capsule, Rfl: 3 .  traMADol (ULTRAM) 50 MG tablet, Take 1 tablet (50 mg total) daily by mouth. (Patient taking differently: Take 50 mg by mouth daily as needed. ), Disp: 90 tablet, Rfl: 0 .  TURMERIC PO, Take at bedtime by mouth., Disp: , Rfl:  .  acetaminophen (TYLENOL) 325 MG tablet, Take 650 mg by mouth daily as needed for mild pain or headache. , Disp: , Rfl:  .  hydrochlorothiazide (HYDRODIURIL) 25 MG tablet, , Disp: , Rfl:  .  lisinopril (PRINIVIL,ZESTRIL) 2.5 MG tablet, Take 1 tablet (2.5 mg total) by mouth daily. (Patient not taking: Reported on 10/26/2018), Disp: 90 tablet, Rfl: 3 .  nitroGLYCERIN (NITROSTAT) 0.4 MG SL tablet, Place 0.4 mg under the tongue every 5 (five) minutes as needed for chest pain., Disp: , Rfl:    Physical exam:  Vitals:   10/26/18 1508  BP: 127/62  Pulse: 65  Resp: 18  Temp: 97.7 F (36.5 C)  TempSrc: Tympanic  SpO2: 98%  Weight: 191 lb 8 oz (86.9 kg)  Height: 5\' 9"  (1.753 m)   Physical Exam  Constitutional: He is oriented to person, place, and time. He appears well-developed and well-nourished.  Frail elderly gentleman in no acute distress.  He ambulates with a cane  HENT:  Head: Normocephalic and atraumatic.  Eyes: Pupils are equal, round, and reactive to light. EOM are normal.  Neck: Normal range of motion.  Cardiovascular: Normal rate and regular rhythm.  Ejection systolic murmur  Pulmonary/Chest: Effort normal and breath sounds normal.  Abdominal: Soft. Bowel sounds are normal.  Neurological: He is alert and oriented to person, place, and time.  Skin: Skin is warm and dry.       CMP Latest Ref Rng & Units 10/02/2018  Glucose 70 - 99 mg/dL 144(H)  BUN 8 - 23 mg/dL 15  Creatinine 0.61 - 1.24 mg/dL 1.15  Sodium 135 - 145 mmol/L 142  Potassium 3.5 - 5.1 mmol/L 3.6  Chloride 98 - 111 mmol/L 112(H)  CO2 22 -  32 mmol/L 26  Calcium 8.9 - 10.3 mg/dL 8.3(L)  Total Protein 6.0 - 8.5 g/dL -  Total  Bilirubin 0.0 - 1.2 mg/dL -  Alkaline Phos 39 - 117 IU/L -  AST 0 - 40 IU/L -  ALT 0 - 44 IU/L -   CBC Latest Ref Rng & Units 10/26/2018  WBC 3.4 - 10.8 x10E3/uL 7.2  Hemoglobin 13.0 - 17.7 g/dL 12.4(L)  Hematocrit 37.5 - 51.0 % 38.1  Platelets 150 - 450 x10E3/uL 140(L)    No images are attached to the encounter.  Dg Chest 2 View  Result Date: 10/01/2018 CLINICAL DATA:  Chest pain. EXAM: CHEST - 2 VIEW COMPARISON:  Radiograph of June 02, 2016. FINDINGS: The heart size and mediastinal contours are within normal limits. Status post transcatheter aortic valve replacement. Status post coronary artery bypass graft. No pneumothorax or pleural effusion is noted. Both lungs are clear. The visualized skeletal structures are unremarkable. IMPRESSION: No active cardiopulmonary disease. Aortic Atherosclerosis (ICD10-I70.0). Electronically Signed   By: Marijo Conception, M.D.   On: 10/01/2018 19:41    Assessment and plan- Patient is a 82 y.o. male referred for iron deficiency anemia secondary to upper GI bleeding from gastric AVMs  Patient was found to have severe iron deficiency anemia in October 2019 when he was admitted to the hospital with a hemoglobin of 7.  He was discharged from the hospital with a hemoglobin of 8.5.  He has been taking oral iron 325 mg 3 times dailyAlong with oral B12 thousand micrograms p.o. daily.  Labs done at the GI office shows that his hemoglobin is improved from 8.9-12.4.  B12 levels are also now normal at 768.  Iron studies are normal and ferritin is improved to 79.  He does not require IV iron at this time.  Repeat CBC ferritin and iron studies in 2 months in 4 months and I will see him back in 4 months.  I will consider IV iron if he has problems tolerating oral iron or if he continues to be iron deficient despite taking oral iron.   Thank you for this kind referral and the opportunity to participate in the care of this patient   Visit Diagnosis 1. Iron deficiency  anemia due to chronic blood loss     Dr. Randa Evens, MD, MPH [facility] at [facility] [phone] 10/27/2018

## 2018-10-27 NOTE — Telephone Encounter (Signed)
Daughter Mrs. Nada Boozer about her father's results of test yesterday.  Daughter know that hemoglobin is 12.4,Ferritin is 79, iron stores are normal, B12 is normal at 768.  Based on the lab work he does not need any iron.  We will change his appointments for 2 months for labs and then 4 months labs and Dr.  With the labs being a day or 2 ahead of time before seeing the doctor.  He can also stop his oral B12 and his oral iron pills.  I have asked the daughter to call me back so we can set up a two-month and four-month appointment rather than what we had looked at yesterday which was a 1 and 66-month appointment.  Will await daughter to call back

## 2018-10-30 ENCOUNTER — Telehealth: Payer: Self-pay | Admitting: *Deleted

## 2018-10-30 DIAGNOSIS — D500 Iron deficiency anemia secondary to blood loss (chronic): Secondary | ICD-10-CM

## 2018-10-30 NOTE — Telephone Encounter (Signed)
Called daughter Ms. Nada Boozer and told her we had been leaving each other messages.  She did get my message that the labs were good and instead of 1 month appt it will be check labs at 2 months and in 4 months with labs and see md.  She is agreeable. She already got the info about stopping iron pill and b12 pill.  12/28/18 at 11:30 for labs 02/23/19 labs at 2:30 and md 3 pm She uses my chart and will see appts pop up and if she has questions or concerns to just call and she is agreeable to the plan

## 2018-11-14 ENCOUNTER — Ambulatory Visit: Payer: Medicare HMO | Admitting: Pain Medicine

## 2018-11-21 ENCOUNTER — Inpatient Hospital Stay: Payer: Medicare HMO | Attending: Oncology

## 2018-11-23 ENCOUNTER — Inpatient Hospital Stay: Payer: Medicare HMO | Admitting: Oncology

## 2018-12-01 ENCOUNTER — Ambulatory Visit: Payer: Medicare HMO | Admitting: Cardiovascular Disease

## 2018-12-01 ENCOUNTER — Encounter: Payer: Self-pay | Admitting: Cardiovascular Disease

## 2018-12-01 VITALS — BP 144/60 | HR 57 | Ht 69.0 in | Wt 200.0 lb

## 2018-12-01 DIAGNOSIS — I25118 Atherosclerotic heart disease of native coronary artery with other forms of angina pectoris: Secondary | ICD-10-CM

## 2018-12-01 DIAGNOSIS — I739 Peripheral vascular disease, unspecified: Secondary | ICD-10-CM

## 2018-12-01 DIAGNOSIS — I10 Essential (primary) hypertension: Secondary | ICD-10-CM

## 2018-12-01 DIAGNOSIS — I5022 Chronic systolic (congestive) heart failure: Secondary | ICD-10-CM | POA: Diagnosis not present

## 2018-12-01 DIAGNOSIS — Z952 Presence of prosthetic heart valve: Secondary | ICD-10-CM

## 2018-12-01 DIAGNOSIS — I447 Left bundle-branch block, unspecified: Secondary | ICD-10-CM

## 2018-12-01 DIAGNOSIS — E785 Hyperlipidemia, unspecified: Secondary | ICD-10-CM

## 2018-12-01 MED ORDER — SACUBITRIL-VALSARTAN 24-26 MG PO TABS: 1.0000 | ORAL_TABLET | Freq: Two times a day (BID) | ORAL | 0 refills | Status: DC

## 2018-12-01 MED ORDER — SACUBITRIL-VALSARTAN 24-26 MG PO TABS: 1.0000 | ORAL_TABLET | Freq: Two times a day (BID) | ORAL | 3 refills | Status: DC

## 2018-12-01 NOTE — Progress Notes (Signed)
Cardiology Office Note   Date:  12/01/2018   ID:  Matthew [name], DOB [birth year], MRN [id number]  PCP:  Barry Dienes, NP  Cardiologist:   [name], MD   Chief Complaint  Patient presents with  . other    35mo follow up.Medications reviewed verbally.       History of Present Illness: Matthew [name] is a 82 y.o. male who presents for a follow-up visit regarding coronary artery disease , aortic stenosis and peripheral arterial disease. He is status post CABG twice in 1991 and 2004. He is status post bilateral common iliac artery kissing stent placement in 2015. He is status post TAVR in June 2017. Cardiac catheterization before that showed patent grafts. He developed left bundle branch block post TAVR and this correlated with a gradual decline in his ejection fraction.  Before TAVR, his EF was 55 to 60%.  Shortly after TAVR, his EF was 55%.  In 2018, his EF was noted to be 45 to 50% and then dropped to 30 to 35% in 2019. Most recent echo in October 2019 showed an EF of 30 to 35% with normal functioning TAVR prosthesis with a mean gradient of 13 mmHg.  Left atrium was moderately dilated. Due to worsening angina and ejection fraction, I proceeded with left heart catheterization in June which showed significant underlying three-vessel coronary artery disease with patent LIMA to LAD, patent RIMA to diagonal, patent SVG to OM 3 and patent SVG to right PDA.  The SVG to OM 2 was found to be occluded.  However, OM 2 had retrograde flow from SVG to 3.  There was possible left subclavian artery stenosis but it was not significant by gradient.  This was also interrogated by carotid Doppler and there was nothing to suggest significant left subclavian stenosis. He was noted to have frequent PVCs during cardiac catheterization but that has gradually improved. Holter monitor in June showed 5600 PVCs in 24 hours. We tried Ranexa for angina but he developed a rash with it.  He was hospitalized in  October with GI bleed and blood loss anemia.  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  His anemia improved with IV iron.  In spite of that, he continues to complain of significant exertional chest pain and shortness of breath which has limited his physical activities.  He is normally very active at baseline.  Past Medical History:  Diagnosis Date  . AS (aortic stenosis)    a. Echo 6/10: EF 55% mild AS; b. echo 06/2015; EF 55-60%, GR1DD, moderate AS, Peak velocity (S): 346 cm/s. Mean gradientS): 28 mm Hg. Peak gradient (S): 48 mm Hg. Valve area (VTI): 1.18 cm2;   c. Echo 4/17 - mild LVH, EF 55-60%, no RWMA, Gr 1 DD, mod to severe AS (mean 28 mmHg, peak 46 mmHg), mild LAE  . CAD (coronary artery disease)    a. MI 1996 w/ CABG 1996; b.redo CABG 06/2003; c. Myoview 7/09: EF 54% inferobasal infarct, no ischemia. Myoview 6/10 EF 43% inf wall infarct. no ischemia; c. cath 8/16 s/p DES to VG-OM2. OTW 3VD w/ patent VG->RPDA, VG->OM3,  & LIMA->LAD.  EF 55-65%.  . Carotid bruit    2009 0-39% on dopplers bilatrally  . Chest pain   . GERD (gastroesophageal reflux disease)   . History of kidney stones   . HOH (hard of hearing)   . HTN (hypertension)   . Hyperlipidemia   . Myocardial infarction ([place]) 1996  .  Nephrolithiasis   . OSA (obstructive sleep apnea)   . PAD (peripheral artery disease) ([place]) 02/2014   Subtotal occlusion of right common iliac artery and 70% stenosis in the left common iliac artery. Status post bilateral kissing stent placement. 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 Shortness of breath    with exertion  . Thrombocytopenia ([place])     Past Surgical History:  Procedure Laterality Date  . ABDOMINAL AORTAGRAM N/A 03/06/2014   Procedure: ABDOMINAL Maxcine Ham;  Surgeon: Wellington Hampshire, MD;  Location: [place] CATH  LAB;  Service: Cardiovascular;  Laterality: N/A;  . CARDIAC CATHETERIZATION  02/2014   Severe three-vessel coronary artery disease with patent grafts.   Marland Kitchen CARDIAC CATHETERIZATION N/A 07/31/2015   Procedure: Right/Left Heart Cath and Coronary/Graft Angiography;  Surgeon: Wellington Hampshire, MD;  Location: [place] CV LAB;  Service: Cardiovascular;  Laterality: N/A;  . CARDIAC CATHETERIZATION N/A 07/31/2015   Procedure: Coronary Stent Intervention;  Surgeon: Wellington Hampshire, MD;  Location: [place] CV LAB;  Service: Cardiovascular;  Laterality: N/A;  . CARDIAC CATHETERIZATION N/A 05/26/2016   Procedure: Right/Left Heart Cath and Coronary Angiography;  Surgeon: Wellington Hampshire, MD;  Location: [place] CV LAB;  Service: Cardiovascular;  Laterality: N/A;  . COLONOSCOPY WITH PROPOFOL N/A 10/07/2018   Procedure: COLONOSCOPY WITH PROPOFOL;  Surgeon: Lin Landsman, MD;  Location: [facility] ENDOSCOPY;  Service: Gastroenterology;  Laterality: N/A;  . CORONARY ARTERY BYPASS GRAFT  1991   at [facility]. Redone 2004-3 vessels 1st time and 4 second time  . CYSTO     2/3 times for kidney stones  . ESOPHAGOGASTRODUODENOSCOPY (EGD) WITH PROPOFOL N/A 10/07/2018   Procedure: ESOPHAGOGASTRODUODENOSCOPY (EGD) WITH PROPOFOL;  Surgeon: Lin Landsman, MD;  Location: [place];  Service: Gastroenterology;  Laterality: N/A;  . INGUINAL HERNIA REPAIR  11/10/2011   Procedure: HERNIA REPAIR INGUINAL ADULT;  Surgeon: Donato Heinz;  Location: [facility];  Service: General;  Laterality: Right;  . LEFT HEART CATH AND CORONARY ANGIOGRAPHY Left 05/15/2018   Procedure: LEFT HEART CATH AND CORONARY ANGIOGRAPHY;  Surgeon: Wellington Hampshire, MD;  Location: [place] CV LAB;  Service: Cardiovascular;  Laterality: Left;  . LEFT HEART CATHETERIZATION WITH CORONARY/GRAFT ANGIOGRAM N/A 03/06/2014   Procedure: LEFT HEART CATHETERIZATION WITH Beatrix Fetters;  Surgeon: Wellington Hampshire, MD;  Location: [place] CATH LAB;   Service: Cardiovascular;  Laterality: N/A;  . STOMACH SURGERY     removal of gastric ulcers  . TEE WITHOUT CARDIOVERSION N/A 06/01/2016   Procedure: TRANSESOPHAGEAL ECHOCARDIOGRAM (TEE);  Surgeon: Sherren Mocha, MD;  Location: [place];  Service: Open Heart Surgery;  Laterality: N/A;  . TRANSCATHETER AORTIC VALVE REPLACEMENT, TRANSFEMORAL N/A 06/01/2016   Procedure: TRANSCATHETER AORTIC VALVE REPLACEMENT, TRANSFEMORAL;  Surgeon: Sherren Mocha, MD;  Location: [place];  Service: Open Heart Surgery;  Laterality: N/A;     Current Outpatient Medications  Medication Sig Dispense Refill  . acetaminophen (TYLENOL) 325 MG tablet Take 650 mg by mouth daily as needed for mild pain or headache.     Marland Kitchen aspirin EC 81 MG tablet Take 1 tablet (81 mg total) by mouth daily. 90 tablet 3  . atorvastatin (LIPITOR) 80 MG tablet Take 1 tablet (80 mg total) by mouth daily. 90 tablet 1  . carvedilol (COREG) 3.125 MG tablet Take 1 tablet (3.125 mg total) by mouth 2 (two)  times daily. 180 tablet 1  . clopidogrel (PLAVIX) 75 MG tablet Take 1 tablet (75 mg total) by mouth daily with breakfast. 90 tablet 1  . gabapentin (NEURONTIN) 100 MG capsule Take 1-3 capsules (100-300 mg total) by mouth 4 (four) times daily. Follow written titration schedule. 360 capsule 2  . GARLIC PO Take by mouth daily.    . hydrochlorothiazide (HYDRODIURIL) 25 MG tablet     . isosorbide dinitrate (ISORDIL) 10 MG tablet Take 2 tablets (20 mg total) by mouth every morning. Take 10 mg by mouth in the afternoon (Patient taking differently: Take 10 mg by mouth 2 (two) times daily. Take 20mg  in the morning and 10 mg by mouth in the afternoon) 270 tablet 3  . Multiple Vitamins-Minerals (PRESERVISION AREDS PO) Take 1 tablet by mouth daily.     . nitroGLYCERIN (NITROSTAT) 0.4 MG SL tablet Place 0.4 mg under the tongue every 5 (five) minutes as needed for chest pain.    . Omega-3 Fatty Acids (FISH OIL PO) Take by mouth daily.    . pantoprazole (PROTONIX) 40 MG  tablet Take 1 tablet (40 mg total) by mouth daily. 90 tablet 3  . tamsulosin (FLOMAX) 0.4 MG CAPS capsule Take 1 capsule (0.4 mg total) by mouth daily. 90 capsule 3  . TURMERIC PO Take at bedtime by mouth.    Marland Kitchen lisinopril (PRINIVIL,ZESTRIL) 2.5 MG tablet Take 1 tablet (2.5 mg total) by mouth daily. (Patient not taking: Reported on 10/26/2018) 90 tablet 3  . traMADol (ULTRAM) 50 MG tablet Take 1 tablet (50 mg total) daily by mouth. (Patient taking differently: Take 50 mg by mouth daily as needed. ) 90 tablet 0   No current facility-administered medications for this visit.     Allergies:   Ranexa [ranolazine er]    Social History:  The patient  reports that he quit smoking about 28 years ago. His smoking use included cigarettes. He has a 50.00 pack-year smoking history. He has never used smokeless tobacco. He reports that he does not drink alcohol or use drugs.   Family History:  The patient's family history includes Heart disease in his father and mother.    ROS:  Please see the history of present illness.   Otherwise, review of systems are positive for none.   All other systems are reviewed and negative.    PHYSICAL EXAM: VS:  BP (!) 144/60 (BP Location: Left Arm, Patient Position: Sitting, Cuff Size: Normal)   Pulse (!) 57   Ht 5\' 9"  (1.753 m)   Wt 200 lb (90.7 kg)   BMI 29.53 kg/m  , BMI Body mass index is 29.53 kg/m. GEN: Well nourished, well developed, in no acute distress  HEENT: normal  Neck: no JVD, carotid bruits, or masses Cardiac: RRR; no rubs, or gallops,no edema .  Fixed splitting of S2.  2/6 crescendo decrescendo aortic murmur which is early peaking. Respiratory:  clear to auscultation bilaterally, normal work of breathing GI: soft, nontender, nondistended, + BS MS: no deformity or atrophy  Skin: warm and dry, no rash Neuro:  Strength and sensation are intact Psych: euthymic mood, full affect Vascular: Femoral pulses are normal bilaterally. Distal pulses are  palpable.  EKG:  EKG is ordered today. The ekg ordered today demonstrates sinus bradycardia with PVCs.   Recent Labs: 04/11/2018: ALT 14 05/22/2018: Magnesium 1.9 10/02/2018: BUN 15; Creatinine, Ser 1.15; Potassium 3.6; Sodium 142; TSH 2.817 10/26/2018: Hemoglobin 12.4; Platelets 140    Lipid Panel  Component Value Date/Time   CHOL 122 10/02/2018 0847   CHOL 160 04/11/2018 1451   TRIG 147 10/02/2018 0847   HDL 30 (L) 10/02/2018 0847   HDL 44 04/11/2018 1451   CHOLHDL 4.1 10/02/2018 0847   VLDL 29 10/02/2018 0847   LDLCALC 63 10/02/2018 0847   LDLCALC 63 04/11/2018 1451      Wt Readings from Last 3 Encounters:  12/01/18 200 lb (90.7 kg)  10/26/18 191 lb 8 oz (86.9 kg)  10/26/18 191 lb 3.2 oz (86.7 kg)        ASSESSMENT AND PLAN:  1.  S/p TAVR  for severe symptomatic aortic stenosis: Recent echocardiogram in October showed normal functioning prosthesis with a mean gradient of 13 mmHg.  2. Coronary artery disease involving graft bypass with stable angina: No revascularization is needed based on most recent cardiac catheterization in June.  3.  Chronic systolic heart failure: EF is 30 to 35%.  The exact etiology for his cardiomyopathy is not entirely clear but it did correlate with development of left bundle branch block post TAVR.  I do not think his PVC burden is high enough to be the culprit.  He is currently [place] heart association class III and reports significant limitations.  I elected to stop hydrochlorothiazide and start him on Entresto.  He is no longer on lisinopril.  Check basic metabolic profile in 1 week. I am going to refer him to Dr. Caryl Comes to see if there is a role for CRT.  4. Essential hypertension: Blood pressure is controlled on current medications   5. Peripheral arterial disease: No recurrent claudication since bilateral iliac stenting.   6. Hyperlipidemia: Continue high-dose atorvastatin with a target LDL of less than 70.  Most recent LDL was  63.   Disposition:   FU with me in 3 months  Signed,  [name], MD  12/01/2018 4:33 PM    [place]

## 2018-12-01 NOTE — Patient Instructions (Signed)
Medication Instructions:  STOP the Hydrochlorothiazide START Entresto 24-26 mg twice daily  If you need a refill on your cardiac medications before your next appointment, please call your pharmacy.   Lab work: Your provider would like for you to return in one week to have the following labs drawn: BMET. Please go to the [facility] entrance and check in at the front desk. You do not need an appointment.   Testing/Procedures: None ordered  Follow-Up: At [facility], you and your health needs are our priority.  As part of our continuing mission to provide you with exceptional heart care, we have created designated Provider Care Teams.  These Care Teams include your primary Cardiologist (physician) and Advanced Practice Providers (APPs -  Physician Assistants and Nurse Practitioners) who all work together to provide you with the care you need, when you need it. You will need a follow up appointment in 3 months.  You may see Dr. Fletcher Anon or one of the following Advanced Practice Providers on your designated Care Team:   Murray Hodgkins, NP Christell Faith, PA-C . Marrianne Mood, PA-C  Any Other Special Instructions Will Be Listed Below (If Applicable). A referral has been placed for Dr. Caryl Comes.

## 2018-12-04 NOTE — Addendum Note (Signed)
Addended by: Alba Destine on: 12/04/2018 11:45 AM   Modules accepted: Orders

## 2018-12-04 NOTE — Addendum Note (Signed)
Addended by: Alba Destine on: 12/04/2018 11:46 AM   Modules accepted: Orders

## 2018-12-05 ENCOUNTER — Telehealth: Payer: Self-pay | Admitting: Cardiovascular Disease

## 2018-12-05 MED ORDER — SACUBITRIL-VALSARTAN 24-26 MG PO TABS: 1.0000 | ORAL_TABLET | Freq: Two times a day (BID) | ORAL | 0 refills | Status: DC

## 2018-12-05 NOTE — Telephone Encounter (Signed)
Patient daughter calling  States that the pharmacy is attempting to get a hold of [place] for the prior auth Would like for [place] to contact them to complete prior auth process

## 2018-12-05 NOTE — Telephone Encounter (Signed)
Fax received from CoverMyMeds requesting a PA for Entresto. PA was started in CoverMyMeds and sent to [facility].   Per message received: "[employer] has not yet replied to your PA request.  To check for an update later, open this request again from your dashboard. If [employer] has not replied to your request within 24-72 hours please contact [employer] at [phone]."

## 2018-12-05 NOTE — Telephone Encounter (Signed)
Patient's daughter called and made aware that the PA could take up to a week. She has verbalized her understanding.  A $10 co pay card has been left at the front office for the patient.

## 2018-12-05 NOTE — Telephone Encounter (Signed)
Patient daughter calling Would like to know if we received the request for prior authorization for [facility] and would like to know what the prior auth process entails Please call to discuss

## 2018-12-07 NOTE — Telephone Encounter (Signed)
Patient's daughter called and informed that Matthew [name] has been approved through 12/13/2019.  She stated that the patient's blood pressure was 170/78 yesterday but the paitent is asymptomatic. She has been instructed to keep a log of the blood pressures and to call back if it stays elevated. She has verbalized her understanding.

## 2018-12-08 ENCOUNTER — Telehealth: Payer: Self-pay | Admitting: *Deleted

## 2018-12-08 MED ORDER — SACUBITRIL-VALSARTAN 24-26 MG PO TABS: 1.0000 | ORAL_TABLET | Freq: Two times a day (BID) | ORAL | 3 refills | Status: DC

## 2018-12-08 NOTE — Telephone Encounter (Signed)
Entresto Approved through Covermymeds  Status: Approved Coverage Starts on: 12/05/2018 Coverage Ends on: 12/13/2019 Questions? Contact [phone]

## 2018-12-08 NOTE — Telephone Encounter (Signed)
Delene Loll has been sent to [employer] per the patient request.

## 2018-12-15 ENCOUNTER — Telehealth: Payer: Self-pay | Admitting: *Deleted

## 2018-12-15 ENCOUNTER — Other Ambulatory Visit: Payer: Self-pay | Admitting: *Deleted

## 2018-12-15 DIAGNOSIS — I5022 Chronic systolic (congestive) heart failure: Secondary | ICD-10-CM

## 2018-12-15 DIAGNOSIS — I10 Essential (primary) hypertension: Secondary | ICD-10-CM

## 2018-12-15 DIAGNOSIS — E785 Hyperlipidemia, unspecified: Secondary | ICD-10-CM

## 2018-12-15 NOTE — Telephone Encounter (Signed)
BMET repeat lab order has been faxed to the patient's PCP per the request of the daughter and permission of [place]. The patient's daughter stated that it is easier to have the labs drawn there. Orders faxed to [phone].

## 2018-12-18 ENCOUNTER — Encounter: Payer: Self-pay | Admitting: Oncology

## 2018-12-18 NOTE — Telephone Encounter (Signed)
Yes ok to check cbc ferritin and iron studies tomorrow on 1/7

## 2018-12-19 ENCOUNTER — Other Ambulatory Visit: Payer: Self-pay | Admitting: Oncology

## 2018-12-19 ENCOUNTER — Inpatient Hospital Stay: Payer: Medicare HMO | Attending: Oncology

## 2018-12-19 ENCOUNTER — Ambulatory Visit: Payer: Medicare HMO | Admitting: Pain Medicine

## 2018-12-19 DIAGNOSIS — D509 Iron deficiency anemia, unspecified: Secondary | ICD-10-CM | POA: Diagnosis present

## 2018-12-19 DIAGNOSIS — D500 Iron deficiency anemia secondary to blood loss (chronic): Secondary | ICD-10-CM

## 2018-12-19 DIAGNOSIS — E538 Deficiency of other specified B group vitamins: Secondary | ICD-10-CM | POA: Insufficient documentation

## 2018-12-19 LAB — BASIC METABOLIC PANEL
BUN/Creatinine Ratio: 18 (ref 10–24)
BUN: 17 mg/dL (ref 8–27)
CO2: 20 mmol/L (ref 20–29)
Calcium: 9 mg/dL (ref 8.6–10.2)
Chloride: 109 mmol/L — ABNORMAL HIGH (ref 96–106)
Creatinine, Ser: 0.94 mg/dL (ref 0.76–1.27)
GFR calc Af Amer: 88 mL/min/{1.73_m2} (ref >59)
GFR, EST NON AFRICAN AMERICAN: 76 mL/min/{1.73_m2} (ref >59)
Glucose: 135 mg/dL — ABNORMAL HIGH (ref 65–99)
POTASSIUM: 3.7 mmol/L (ref 3.5–5.2)
Sodium: 143 mmol/L (ref 134–144)

## 2018-12-19 LAB — CBC
HCT: 32.3 % — ABNORMAL LOW (ref 39.0–52.0)
Hemoglobin: 10 g/dL — ABNORMAL LOW (ref 13.0–17.0)
MCH: 26.3 pg (ref 26.0–34.0)
MCHC: 31 g/dL (ref 30.0–36.0)
MCV: 85 fL (ref 80.0–100.0)
NRBC: 0 % (ref 0.0–0.2)
Platelets: 123 10*3/uL — ABNORMAL LOW (ref 150–400)
RBC: 3.8 MIL/uL — AB (ref 4.22–5.81)
RDW: 13.9 % (ref 11.5–15.5)
WBC: 5 10*3/uL (ref 4.0–10.5)

## 2018-12-19 LAB — IRON AND TIBC
Iron: 18 ug/dL — ABNORMAL LOW (ref 45–182)
Saturation Ratios: 5 % — ABNORMAL LOW (ref 17.9–39.5)
TIBC: 385 ug/dL (ref 250–450)
UIBC: 367 ug/dL

## 2018-12-19 LAB — VITAMIN B12: VITAMIN B 12: 209 pg/mL (ref 180–914)

## 2018-12-19 LAB — SPECIMEN STATUS REPORT

## 2018-12-19 LAB — FERRITIN: FERRITIN: 6 ng/mL — AB (ref 24–336)

## 2018-12-20 ENCOUNTER — Encounter: Payer: Self-pay | Admitting: Gastroenterology

## 2018-12-20 ENCOUNTER — Telehealth: Payer: Self-pay | Admitting: Gastroenterology

## 2018-12-20 ENCOUNTER — Other Ambulatory Visit: Payer: Self-pay

## 2018-12-20 DIAGNOSIS — D500 Iron deficiency anemia secondary to blood loss (chronic): Secondary | ICD-10-CM

## 2018-12-20 NOTE — Telephone Encounter (Signed)
PT DAUGHTER IS CALLING FOR TEMEKA SHE JUST MISSED A CALL

## 2018-12-21 ENCOUNTER — Telehealth: Payer: Self-pay | Admitting: *Deleted

## 2018-12-21 NOTE — Telephone Encounter (Signed)
Patient's daughter made aware of results and verbalized understanding.  Per Dr. Fletcher Anon (pertaining to the patient advise request question~please see epic) Please let the daughter know that I reviewed his chart and the recent findings. Given his recurrent anemia, I recommend stopping Plavix and keeping him on aspirin. This way we can decrease the risk of recurrent bleeding and anemia. Plavix is no longer needed given that he had no recent stenting and his TAVR was more than a year ago.  The daughter has been made aware and will call back if anything further is needed.

## 2018-12-21 NOTE — Telephone Encounter (Signed)
-----   Message from Wellington Hampshire, MD sent at 12/20/2018  5:56 PM EST ----- Stable labs on Entresto.

## 2018-12-22 ENCOUNTER — Ambulatory Visit
Admission: RE | Admit: 2018-12-22 | Discharge: 2018-12-22 | Disposition: A | Discharge status: Home / Self Care | Payer: Medicare HMO | Attending: Gastroenterology | Admitting: Gastroenterology

## 2018-12-22 ENCOUNTER — Encounter
Admission: RE | Disposition: A | Discharge status: Home / Self Care | Payer: Self-pay | Source: Home / Self Care | Attending: Gastroenterology

## 2018-12-22 ENCOUNTER — Encounter: Payer: Self-pay | Admitting: Gastroenterology

## 2018-12-22 DIAGNOSIS — D509 Iron deficiency anemia, unspecified: Secondary | ICD-10-CM | POA: Insufficient documentation

## 2018-12-22 DIAGNOSIS — Q2733 Arteriovenous malformation of digestive system vessel: Secondary | ICD-10-CM | POA: Diagnosis not present

## 2018-12-22 HISTORY — PX: GIVENS CAPSULE STUDY: SHX5432

## 2018-12-22 SURGERY — IMAGING PROCEDURE, GI TRACT, INTRALUMINAL, VIA CAPSULE
Anesthesia: General

## 2018-12-22 NOTE — OR Nursing (Signed)
Patient ID confirmed, consent signed, instructions reviewed with patient, he verbalized understanding of same, written instructions given to pt to refer to.  Patient swallowed capsule and agreed to return at 4:00 pm to return equipment.

## 2018-12-25 ENCOUNTER — Ambulatory Visit
Admission: RE | Admit: 2018-12-25 | Discharge: 2018-12-25 | Disposition: A | Discharge status: Home / Self Care | Payer: Medicare HMO | Attending: Gastroenterology | Admitting: Gastroenterology

## 2018-12-25 ENCOUNTER — Ambulatory Visit: Payer: Medicare HMO | Admitting: Anesthesiology

## 2018-12-25 ENCOUNTER — Encounter
Admission: RE | Disposition: A | Discharge status: Home / Self Care | Payer: Self-pay | Source: Home / Self Care | Attending: Gastroenterology

## 2018-12-25 DIAGNOSIS — Z951 Presence of aortocoronary bypass graft: Secondary | ICD-10-CM | POA: Diagnosis not present

## 2018-12-25 DIAGNOSIS — Z79899 Other long term (current) drug therapy: Secondary | ICD-10-CM | POA: Diagnosis not present

## 2018-12-25 DIAGNOSIS — I739 Peripheral vascular disease, unspecified: Secondary | ICD-10-CM | POA: Insufficient documentation

## 2018-12-25 DIAGNOSIS — K922 Gastrointestinal hemorrhage, unspecified: Secondary | ICD-10-CM | POA: Diagnosis not present

## 2018-12-25 DIAGNOSIS — I2510 Atherosclerotic heart disease of native coronary artery without angina pectoris: Secondary | ICD-10-CM | POA: Diagnosis not present

## 2018-12-25 DIAGNOSIS — Z952 Presence of prosthetic heart valve: Secondary | ICD-10-CM | POA: Insufficient documentation

## 2018-12-25 DIAGNOSIS — G4733 Obstructive sleep apnea (adult) (pediatric): Secondary | ICD-10-CM | POA: Insufficient documentation

## 2018-12-25 DIAGNOSIS — K5520 Angiodysplasia of colon without hemorrhage: Secondary | ICD-10-CM | POA: Diagnosis not present

## 2018-12-25 DIAGNOSIS — I10 Essential (primary) hypertension: Secondary | ICD-10-CM | POA: Insufficient documentation

## 2018-12-25 DIAGNOSIS — E785 Hyperlipidemia, unspecified: Secondary | ICD-10-CM | POA: Insufficient documentation

## 2018-12-25 DIAGNOSIS — I252 Old myocardial infarction: Secondary | ICD-10-CM | POA: Diagnosis not present

## 2018-12-25 DIAGNOSIS — Z86010 Personal history of colonic polyps: Secondary | ICD-10-CM

## 2018-12-25 DIAGNOSIS — D500 Iron deficiency anemia secondary to blood loss (chronic): Secondary | ICD-10-CM

## 2018-12-25 DIAGNOSIS — Z955 Presence of coronary angioplasty implant and graft: Secondary | ICD-10-CM | POA: Insufficient documentation

## 2018-12-25 DIAGNOSIS — K219 Gastro-esophageal reflux disease without esophagitis: Secondary | ICD-10-CM | POA: Insufficient documentation

## 2018-12-25 DIAGNOSIS — Z8719 Personal history of other diseases of the digestive system: Secondary | ICD-10-CM | POA: Insufficient documentation

## 2018-12-25 DIAGNOSIS — Z7982 Long term (current) use of aspirin: Secondary | ICD-10-CM | POA: Diagnosis not present

## 2018-12-25 DIAGNOSIS — Z87891 Personal history of nicotine dependence: Secondary | ICD-10-CM | POA: Diagnosis not present

## 2018-12-25 DIAGNOSIS — K31819 Angiodysplasia of stomach and duodenum without bleeding: Secondary | ICD-10-CM | POA: Insufficient documentation

## 2018-12-25 HISTORY — PX: ENTEROSCOPY: SHX5533

## 2018-12-25 SURGERY — ENTEROSCOPY
Anesthesia: General

## 2018-12-25 MED ORDER — PROPOFOL 500 MG/50ML IV EMUL
INTRAVENOUS | Status: DC | PRN
  Administered 2018-12-25: 100 ug/kg/min via INTRAVENOUS

## 2018-12-25 MED ORDER — PHENYLEPHRINE HCL 10 MG/ML IJ SOLN
INTRAMUSCULAR | Status: DC | PRN
  Administered 2018-12-25 (×3): 100 ug via INTRAVENOUS

## 2018-12-25 MED ORDER — PROPOFOL 10 MG/ML IV BOLUS
INTRAVENOUS | Status: AC
  Filled 2018-12-25: qty 40

## 2018-12-25 MED ORDER — SODIUM CHLORIDE 0.9 % IV SOLN
INTRAVENOUS | Status: DC
  Administered 2018-12-25: 1000 mL via INTRAVENOUS

## 2018-12-25 MED ORDER — PROPOFOL 10 MG/ML IV BOLUS
INTRAVENOUS | Status: AC
  Filled 2018-12-25: qty 20

## 2018-12-25 MED ORDER — EPHEDRINE SULFATE 50 MG/ML IJ SOLN
INTRAMUSCULAR | Status: AC
  Filled 2018-12-25: qty 1

## 2018-12-25 MED ORDER — GLYCOPYRROLATE 0.2 MG/ML IJ SOLN
INTRAMUSCULAR | Status: DC | PRN
  Administered 2018-12-25 (×2): 0.1 mg via INTRAVENOUS

## 2018-12-25 MED ORDER — PROPOFOL 10 MG/ML IV BOLUS
INTRAVENOUS | Status: DC | PRN
  Administered 2018-12-25: 40 mg via INTRAVENOUS
  Administered 2018-12-25 (×2): 30 mg via INTRAVENOUS
  Administered 2018-12-25: 20 mg via INTRAVENOUS

## 2018-12-25 MED ORDER — SODIUM CHLORIDE 0.9 % IV SOLN: INTRAVENOUS | Status: DC

## 2018-12-25 MED ORDER — EPHEDRINE SULFATE 50 MG/ML IJ SOLN
INTRAMUSCULAR | Status: DC | PRN
  Administered 2018-12-25: 5 mg via INTRAVENOUS

## 2018-12-25 MED ORDER — LIDOCAINE HCL (PF) 2 % IJ SOLN
INTRAMUSCULAR | Status: AC
  Filled 2018-12-25: qty 10

## 2018-12-25 MED ORDER — GLYCOPYRROLATE 0.2 MG/ML IJ SOLN
INTRAMUSCULAR | Status: AC
  Filled 2018-12-25: qty 1

## 2018-12-25 MED ORDER — LIDOCAINE HCL (CARDIAC) PF 100 MG/5ML IV SOSY
PREFILLED_SYRINGE | INTRAVENOUS | Status: DC | PRN
  Administered 2018-12-25: 60 mg via INTRATRACHEAL

## 2018-12-25 NOTE — Anesthesia Post-op Follow-up Note (Signed)
Anesthesia QCDR form completed.        

## 2018-12-25 NOTE — Transfer of Care (Signed)
Immediate Anesthesia Transfer of Care Note  Patient: Matthew [name]  Procedure(s) Performed: ENTEROSCOPY Single balloon (N/A )  Patient Location: Endoscopy Unit  Anesthesia Type:General  Level of Consciousness: awake and drowsy  Airway & Oxygen Therapy: Patient Spontanous Breathing and Patient connected to nasal cannula oxygen  Post-op Assessment: Report given to RN and Post -op Vital signs reviewed and stable  Post vital signs: stable  Last Vitals:  Vitals Value Taken Time  BP 119/55 12/25/2018 12:03 PM  Temp 36.1 C 12/25/2018 12:02 PM  Pulse 77 12/25/2018 12:05 PM  Resp 15 12/25/2018 12:05 PM  SpO2 96 % 12/25/2018 12:05 PM  Vitals shown include unvalidated device data.  Last Pain:  Vitals:   12/25/18 1202  TempSrc: Tympanic  PainSc:          Complications: No apparent anesthesia complications

## 2018-12-25 NOTE — H&P (Signed)
Cephas Darby, MD [address]  [place]  [place], [place] [address]  Main: [phone]  Fax: [phone] Pager: [phone]  Primary Care Physician:  [facility] Gastroenterologist:  Dr. Cephas Darby  Pre-Procedure History & Physical: HPI:  Matthew [name] is a 83 y.o. male is here for a push enteroscopy.   Past Medical History:  Diagnosis Date  . AS (aortic stenosis)    a. Echo 6/10: EF 55% mild AS; b. echo 06/2015; EF 55-60%, GR1DD, moderate AS, Peak velocity (S): 346 cm/s. Mean gradientS): 28 mm Hg. Peak gradient (S): 48 mm Hg. Valve area (VTI): 1.18 cm2;   c. Echo 4/17 - mild LVH, EF 55-60%, no RWMA, Gr 1 DD, mod to severe AS (mean 28 mmHg, peak 46 mmHg), mild LAE  . CAD (coronary artery disease)    a. MI 1996 w/ CABG 1996; b.redo CABG 06/2003; c. Myoview 7/09: EF 54% inferobasal infarct, no ischemia. Myoview 6/10 EF 43% inf wall infarct. no ischemia; c. cath 8/16 s/p DES to VG-OM2. OTW 3VD w/ patent VG->RPDA, VG->OM3,  & LIMA->LAD.  EF 55-65%.  . Carotid bruit    2009 0-39% on dopplers bilatrally  . Chest pain   . GERD (gastroesophageal reflux disease)   . History of kidney stones   . HOH (hard of hearing)   . HTN (hypertension)   . Hyperlipidemia   . Myocardial infarction ([place]) 1996  . Nephrolithiasis   . OSA (obstructive sleep apnea)   . PAD (peripheral artery disease) ([place]) 02/2014   Subtotal occlusion of right common iliac artery and 70% stenosis in the left common iliac artery. Status post bilateral kissing stent placement. 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 Shortness of breath    with exertion  . Thrombocytopenia ([place])     Past Surgical History:  Procedure Laterality Date  . ABDOMINAL AORTAGRAM N/A 03/06/2014   Procedure: ABDOMINAL Maxcine Ham;   Surgeon: Wellington Hampshire, MD;  Location: [place] CATH LAB;  Service: Cardiovascular;  Laterality: N/A;  . CARDIAC CATHETERIZATION  02/2014   Severe three-vessel coronary artery disease with patent grafts.   Marland Kitchen CARDIAC CATHETERIZATION N/A 07/31/2015   Procedure: Right/Left Heart Cath and Coronary/Graft Angiography;  Surgeon: Wellington Hampshire, MD;  Location: [place] CV LAB;  Service: Cardiovascular;  Laterality: N/A;  . CARDIAC CATHETERIZATION N/A 07/31/2015   Procedure: Coronary Stent Intervention;  Surgeon: Wellington Hampshire, MD;  Location: [facility] CV LAB;  Service: Cardiovascular;  Laterality: N/A;  . CARDIAC CATHETERIZATION N/A 05/26/2016   Procedure: Right/Left Heart Cath and Coronary Angiography;  Surgeon: Wellington Hampshire, MD;  Location: [place] [facility] CV LAB;  Service: Cardiovascular;  Laterality: N/A;  . COLONOSCOPY WITH PROPOFOL N/A 10/07/2018   Procedure: COLONOSCOPY WITH PROPOFOL;  Surgeon: Lin Landsman, MD;  Location: [facility] ENDOSCOPY;  Service: Gastroenterology;  Laterality: N/A;  . CORONARY ARTERY BYPASS GRAFT  1991   at [facility]. Redone 2004-3 vessels 1st time and 4 second time  . CYSTO     2/3 times for kidney stones  . ESOPHAGOGASTRODUODENOSCOPY (EGD) WITH PROPOFOL N/A 10/07/2018   Procedure: ESOPHAGOGASTRODUODENOSCOPY (EGD) WITH PROPOFOL;  Surgeon: Lin Landsman, MD;  Location: [facility];  Service: Gastroenterology;  Laterality: N/A;  . GIVENS CAPSULE STUDY N/A 12/22/2018   Procedure: GIVENS CAPSULE STUDY;  Surgeon: Lin Landsman,  MD;  Location: [facility] ENDOSCOPY;  Service: Gastroenterology;  Laterality: N/A;  . INGUINAL HERNIA REPAIR  11/10/2011   Procedure: HERNIA REPAIR INGUINAL ADULT;  Surgeon: Donato Heinz;  [facility]: [facility];  Service: General;  Laterality: Right;  . LEFT HEART CATH AND CORONARY ANGIOGRAPHY Left 05/15/2018   Procedure: LEFT HEART CATH AND CORONARY ANGIOGRAPHY;  Surgeon: Wellington Hampshire, MD;  Location: [place] CV LAB;  Service:  Cardiovascular;  Laterality: Left;  . LEFT HEART CATHETERIZATION WITH CORONARY/GRAFT ANGIOGRAM N/A 03/06/2014   Procedure: LEFT HEART CATHETERIZATION WITH Beatrix Fetters;  Surgeon: Wellington Hampshire, MD;  Location: [place] CATH LAB;  Service: Cardiovascular;  Laterality: N/A;  . STOMACH SURGERY     removal of gastric ulcers  . TEE WITHOUT CARDIOVERSION N/A 06/01/2016   Procedure: TRANSESOPHAGEAL ECHOCARDIOGRAM (TEE);  Surgeon: Sherren Mocha, MD;  Location: [facility];  Service: Open Heart Surgery;  Laterality: N/A;  . TRANSCATHETER AORTIC VALVE REPLACEMENT, TRANSFEMORAL N/A 06/01/2016   Procedure: TRANSCATHETER AORTIC VALVE REPLACEMENT, TRANSFEMORAL;  Surgeon: Sherren Mocha, MD;  Location: [facility];  Service: Open Heart Surgery;  Laterality: N/A;    Prior to Admission medications   Medication Sig Start Date End Date Taking? Authorizing Provider  acetaminophen (TYLENOL) 325 MG tablet Take 650 mg by mouth daily as needed for mild pain or headache.    Yes [provider]  aspirin EC 81 MG tablet Take 1 tablet (81 mg total) by mouth daily. 03/22/14  Yes Wellington Hampshire, MD  atorvastatin (LIPITOR) 80 MG tablet Take 1 tablet (80 mg total) by mouth daily. 05/23/18  Yes Wellington Hampshire, MD  carvedilol (COREG) 3.125 MG tablet Take 1 tablet (3.125 mg total) by mouth 2 (two) times daily. 07/05/18  Yes Dunn, Ryan M, PA-C  GARLIC PO Take by mouth daily.   Yes [provider]  isosorbide dinitrate (ISORDIL) 10 MG tablet Take 2 tablets (20 mg total) by mouth every morning. Take 10 mg by mouth in the afternoon Patient taking differently: Take 10 mg by mouth 2 (two) times daily. Take 20mg  in the morning and 10 mg by mouth in the afternoon 04/11/18  Yes Dunn, Areta Haber, PA-C  Multiple Vitamins-Minerals (PRESERVISION AREDS PO) Take 1 tablet by mouth daily.    Yes [provider]  nitroGLYCERIN (NITROSTAT) 0.4 MG SL tablet Place 0.4 mg under the tongue every 5 (five) minutes as needed for chest  pain.   Yes [provider]  Omega-3 Fatty Acids (FISH OIL PO) Take by mouth daily.   Yes [provider]  pantoprazole (PROTONIX) 40 MG tablet Take 1 tablet (40 mg total) by mouth daily. 05/31/18  Yes Dunn, Ryan M, PA-C  sacubitril-valsartan (ENTRESTO) 24-26 MG Take 1 tablet by mouth 2 (two) times daily. 12/08/18  Yes Wellington Hampshire, MD  tamsulosin (FLOMAX) 0.4 MG CAPS capsule Take 1 capsule (0.4 mg total) by mouth daily. 06/21/18  Yes Dunn, Areta Haber, PA-C  gabapentin (NEURONTIN) 100 MG capsule Take 1-3 capsules (100-300 mg total) by mouth 4 (four) times daily. Follow written titration schedule. 05/15/18 12/01/18  Vevelyn Francois, NP  traMADol (ULTRAM) 50 MG tablet Take 1 tablet (50 mg total) daily by mouth. Patient taking differently: Take 50 mg by mouth daily as needed.  12/12/17 10/26/18  Milinda Pointer, MD  TURMERIC PO Take at bedtime by mouth.    [provider]    Allergies as of 12/21/2018 - Review Complete 12/01/2018  Allergen Reaction Noted  . Ranexa [ranolazine er]  Rash 07/19/2018    Family History  Problem Relation Age of Onset  . Heart disease Father   . Heart disease Mother     Social History   Socioeconomic History  . Marital status: Married    Spouse name: Not on file  . Number of children: 2  . Years of education: Not on file  . Highest education level: Not on file  Occupational History  . Occupation: Retired  Scientific laboratory technician  . Financial resource strain: Not on file  . Food insecurity:    Worry: Not on file    Inability: Not on file  . Transportation needs:    Medical: Not on file    Non-medical: Not on file  Tobacco Use  . Smoking status: Former Smoker    Packs/day: 1.00    Years: 50.00    Pack years: 50.00    Types: Cigarettes    Last attempt to quit: 01/24/1990    Years since quitting: 28.9  . Smokeless tobacco: Never Used  Substance and Sexual Activity  . Alcohol use: No    Alcohol/week: 0.0 standard drinks    Comment:  Alcoholic  . Drug use: No  . Sexual activity: Never  Lifestyle  . Physical activity:    Days per week: Not on file    Minutes per session: Not on file  . Stress: Not on file  Relationships  . Social connections:    Talks on phone: Not on file    Gets together: Not on file    Attends religious service: Not on file    Active member of club or organization: Not on file    Attends meetings of clubs or organizations: Not on file    Relationship status: Not on file  . Intimate partner violence:    Fear of current or ex partner: Not on file    Emotionally abused: Not on file    Physically abused: Not on file    Forced sexual activity: Not on file  Other Topics Concern  . Not on file  Social History Narrative   Married an lives with wife in [place]. Retired form [employer].     Review of Systems: See HPI, otherwise negative ROS  Physical Exam: BP (!) 155/62   Pulse (!) 56   Temp (!) 96.1 F (35.6 C) (Tympanic)   Resp 20   Ht 5\' 9"  (1.753 m)   Wt 86.2 kg   SpO2 100%   BMI 28.06 kg/m  General:   Alert,  pleasant and cooperative in NAD Head:  Normocephalic and atraumatic. Neck:  Supple; no masses or thyromegaly. Lungs:  Clear throughout to auscultation.    Heart:  Regular rate and rhythm. Abdomen:  Soft, nontender and nondistended. Normal bowel sounds, without guarding, and without rebound.   Neurologic:  Alert and  oriented x4;  grossly normal neurologically.  Impression/Plan: Matthew [name] is here for a push enteroscopy to be performed for IDA, Small bowel AVMs  Risks, benefits, limitations, and alternatives regarding  Push enteroscopy have been reviewed with the patient.  Questions have been answered.  All parties agreeable.   Sherri Sear, MD  12/25/2018, 11:12 AM

## 2018-12-25 NOTE — Op Note (Signed)
[facility] Gastroenterology Patient Name: Matthew [name] Procedure Date: 12/25/2018 11:01 AM MRN: [id number] Account #: [id number] Date of Birth: 03-08-1936 Admit Type: Outpatient Age: 83 Room: [facility] ENDO ROOM 2 Gender: Male Note Status: Finalized Procedure:            Small bowel enteroscopy Indications:          Obscure gastrointestinal bleeding, Recurrent                        gastrointestinal bleeding, Recurrent bleeding in the                        small bowel, Arteriovenous malformation in the small                        intestine Providers:            Lin Landsman MD, MD Referring MD:         No Local Md, MD (Referring MD) Medicines:            Monitored Anesthesia Care Complications:        No immediate complications. Estimated blood loss: None. Procedure:            Pre-Anesthesia Assessment:                       - Prior to the procedure, a History and Physical was                        performed, and patient medications and allergies were                        reviewed. The patient is competent. The risks and                        benefits of the procedure and the sedation options and                        risks were discussed with the patient. All questions                        were answered and informed consent was obtained.                        Patient identification and proposed procedure were                        verified by the physician, the nurse, the                        anesthesiologist, the anesthetist and the technician in                        the pre-procedure area in the procedure room in the                        endoscopy suite. Mental Status Examination: alert and                        oriented. Airway Examination: normal oropharyngeal  airway and neck mobility. Respiratory Examination:                        clear to auscultation. CV Examination: normal.                        Prophylactic  Antibiotics: The patient does not require                        prophylactic antibiotics. Prior Anticoagulants: The                        patient has taken Plavix (clopidogrel), last dose was 5                        days prior to procedure. ASA Grade Assessment: IV - A                        patient with severe systemic disease that is a constant                        threat to life. After reviewing the risks and benefits,                        the patient was deemed in satisfactory condition to                        undergo the procedure. The anesthesia plan was to use                        monitored anesthesia care (MAC). Immediately prior to                        administration of medications, the patient was                        re-assessed for adequacy to receive sedatives. The                        heart rate, respiratory rate, oxygen saturations, blood                        pressure, adequacy of pulmonary ventilation, and                        response to care were monitored throughout the                        procedure. The physical status of the patient was                        re-assessed after the procedure.                       After obtaining informed consent, the endoscope was                        passed under direct vision. Throughout the procedure,  the patient's blood pressure, pulse, and oxygen                        saturations were monitored continuously. The                        Enteroscope was introduced through the mouth and                        advanced to the mid-jejunum. The small bowel                        enteroscopy was accomplished without difficulty. The                        patient tolerated the procedure well. Findings:      A few small to medium angioectasias with no bleeding were found in the       second portion of the duodenum and in the third portion of the duodenum.       Coagulation for hemostasis  using argon plasma was successful.      A few small to medium angioectasias with no bleeding were found in the       proximal jejunum. Coagulation for hemostasis using argon plasma was       successful. Impression:           - A few non-bleeding angioectasias in the duodenum.                        Treated with argon plasma coagulation (APC).                       - A few non-bleeding angioectasias in the jejunum.                        Treated with argon plasma coagulation (APC).                       - No specimens collected. Recommendation:       - Discharge patient to home (with spouse).                       - Resume previous diet today.                       - Resume ASA71m daily                       - Pt reported that his plavix has been discontinued by                        his cardiologist due to chronic GI bleed                       - F/o hematology for parenteral iron                       - CBC, iron studies regularly Procedure Code(s):    --- Professional ---                       [phone] Small intestinal endoscopy, enteroscopy beyond  second portion of duodenum, not including ileum; with                        control of bleeding (eg, injection, bipolar cautery,                        unipolar cautery, laser, heater probe, stapler, plasma                        coagulator) Diagnosis Code(s):    --- Professional ---                       X32.440, Angiodysplasia of stomach and duodenum without                        bleeding                       K55.20, Angiodysplasia of colon without hemorrhage                       K92.2, Gastrointestinal hemorrhage, unspecified                       Q27.33, Arteriovenous malformation of digestive system                        vessel CPT copyright 2018 American Medical Association. All rights reserved. The codes documented in this report are preliminary and upon coder review may  be revised to meet current  compliance requirements. Dr. Ulyess Mort Lin Landsman MD, MD 12/25/2018 12:01:05 PM This report has been signed electronically. Number of Addenda: 0 Note Initiated On: 12/25/2018 11:01 AM      [facility]

## 2018-12-25 NOTE — Anesthesia Preprocedure Evaluation (Addendum)
Anesthesia Evaluation  Patient identified by MRN, date of birth, ID band Patient awake    Reviewed: Allergy & Precautions, H&P , NPO status , Patient's Chart, lab work & pertinent test results  Airway Mallampati: II       Dental  (+) Edentulous Lower, Edentulous Upper   Pulmonary shortness of breath, sleep apnea , former smoker,           Cardiovascular hypertension, + angina (stable) with exertion + CAD, + Past MI, + Peripheral Vascular Disease, +CHF and + DOE    S/p TAVR  for severe symptomatic aortic stenosis: Recent echocardiogram in October 2019 showed normal functioning prosthesis with a mean gradient of 13 mmHg.  2. Coronary artery disease involving graft bypass with stable angina: No revascularization is needed based on most recent cardiac catheterization in June 2019.  3.  Chronic systolic heart failure: EF is 30 to 35%.  The exact etiology for his cardiomyopathy is not entirely clear but it did correlate with development of left bundle branch block post TAVR.  I do not think his PVC burden is high enough to be the culprit.  He is currently [place] heart association class III and reports significant limitations.  Diastolic dysfunction grade 2.      Neuro/Psych  Neuromuscular disease (radiculopathy) negative psych ROS   GI/Hepatic Neg liver ROS, GERD  Controlled,  Endo/Other  negative endocrine ROS  Renal/GU Renal disease (stones)  negative genitourinary   Musculoskeletal   Abdominal   Peds  Hematology  (+) Blood dyscrasia, anemia ,   Anesthesia Other Findings Past Medical History: No date: AS (aortic stenosis)     Comment:  a. Echo 6/10: EF 55% mild AS; b. echo 06/2015; EF 55-60%,              GR1DD, moderate AS, Peak velocity (S): 346 cm/s. Mean               gradientS): 28 mm Hg. Peak gradient (S): 48 mm Hg. Valve               area (VTI): 1.18 cm2;   c. Echo 4/17 - mild LVH, EF               55-60%,  no RWMA, Gr 1 DD, mod to severe AS (mean 28 mmHg,              peak 46 mmHg), mild LAE No date: CAD (coronary artery disease)     Comment:  a. MI 1996 w/ CABG 1996; b.redo CABG 06/2003; c. Myoview               7/09: EF 54% inferobasal infarct, no ischemia. Myoview               6/10 EF 43% inf wall infarct. no ischemia; c. cath 8/16               s/p DES to VG-OM2. OTW 3VD w/ patent VG->RPDA, VG->OM3,                & LIMA->LAD.  EF 55-65%. No date: Carotid bruit     Comment:  2009 0-39% on dopplers bilatrally No date: Chest pain No date: GERD (gastroesophageal reflux disease) No date: History of kidney stones No date: HOH (hard of hearing) No date: HTN (hypertension) No date: Hyperlipidemia 1996: Myocardial infarction ([place]) No date: Nephrolithiasis No date: OSA (obstructive sleep apnea) 02/2014: PAD (peripheral artery disease) ([place])     Comment:  Subtotal occlusion of right common iliac artery and 70%               stenosis in the left common iliac artery. Status post               bilateral kissing stent placement. Significant post               stenosis aneurysmal dilatation on the right side (any               future catheterization through the right femoral artery               should be done cautiously to avoid advancing the wire               behind the stent struts) No date: Shortness of breath     Comment:  with exertion No date: Thrombocytopenia ([place])  Past Surgical History: 03/06/2014: ABDOMINAL Maxcine Ham; N/A     Comment:  Procedure: ABDOMINAL AORTAGRAM;  Surgeon: Wellington Hampshire, MD;  Location: [place] CATH LAB;  Service:               Cardiovascular;  Laterality: N/A; 02/2014: CARDIAC CATHETERIZATION     Comment:  Severe three-vessel coronary artery disease with patent               grafts.  07/31/2015: CARDIAC CATHETERIZATION; N/A     Comment:  Procedure: Right/Left Heart Cath and Coronary/Graft               Angiography;  Surgeon: Wellington Hampshire, MD;   Location:               [place] CV LAB;  Service: Cardiovascular;                Laterality: N/A; 07/31/2015: CARDIAC CATHETERIZATION; N/A     Comment:  Procedure: Coronary Stent Intervention;  Surgeon:               Wellington Hampshire, MD;  Location: [place] CV LAB;                Service: Cardiovascular;  Laterality: N/A; 05/26/2016: CARDIAC CATHETERIZATION; N/A     Comment:  Procedure: Right/Left Heart Cath and Coronary               Angiography;  Surgeon: Wellington Hampshire, MD;  Location:               [place] CV LAB;  Service: Cardiovascular;                Laterality: N/A; 10/07/2018: COLONOSCOPY WITH PROPOFOL; N/A     Comment:  Procedure: COLONOSCOPY WITH PROPOFOL;  Surgeon: Lin Landsman, MD;  Location: [facility] ENDOSCOPY;  Service:               Gastroenterology;  Laterality: N/A; 1991: CORONARY ARTERY BYPASS GRAFT     Comment:  at [facility]. Redone 2004-3 vessels 1st time and 4 second               time No date: CYSTO     Comment:  2/3 times for kidney stones 10/07/2018: ESOPHAGOGASTRODUODENOSCOPY (EGD) WITH PROPOFOL; N/A     Comment:  Procedure: ESOPHAGOGASTRODUODENOSCOPY (EGD) WITH  PROPOFOL;  Surgeon: Lin Landsman, MD;  Location:               [facility] ENDOSCOPY;  Service: Gastroenterology;  Laterality:               N/A; 12/22/2018: GIVENS CAPSULE STUDY; N/A     Comment:  Procedure: GIVENS CAPSULE STUDY;  Surgeon: Lin Landsman, MD;  Location: [facility] ENDOSCOPY;  Service:               Gastroenterology;  Laterality: N/A; 11/10/2011: INGUINAL HERNIA REPAIR     Comment:  Procedure: HERNIA REPAIR INGUINAL ADULT;  Surgeon: Donato Heinz;  [facility];  Service: General;                Laterality: Right; 05/15/2018: LEFT HEART CATH AND CORONARY ANGIOGRAPHY; Left     Comment:  Procedure: LEFT HEART CATH AND CORONARY ANGIOGRAPHY;                Surgeon: Wellington Hampshire, MD;  Location: [place]                CV LAB;  Service: Cardiovascular;  Laterality: Left; 03/06/2014: LEFT HEART CATHETERIZATION WITH CORONARY/GRAFT ANGIOGRAM;  N/A     Comment:  Procedure: LEFT HEART CATHETERIZATION WITH               Beatrix Fetters;  Surgeon: Wellington Hampshire, MD;              Location: [place] CATH LAB;  Service: Cardiovascular;                Laterality: N/A; No date: STOMACH SURGERY     Comment:  removal of gastric ulcers 06/01/2016: TEE WITHOUT CARDIOVERSION; N/A     Comment:  Procedure: TRANSESOPHAGEAL ECHOCARDIOGRAM (TEE);                Surgeon: Sherren Mocha, MD;  Location: [place];  Service:               Open Heart Surgery;  Laterality: N/A; 06/01/2016: TRANSCATHETER AORTIC VALVE REPLACEMENT, TRANSFEMORAL; N/A     Comment:  Procedure: TRANSCATHETER AORTIC VALVE REPLACEMENT,               TRANSFEMORAL;  Surgeon: Sherren Mocha, MD;  Location: [place];  Service: Open Heart Surgery;  Laterality: N/A;  BMI    Body Mass Index:  28.06 kg/m      Reproductive/Obstetrics negative OB ROS                            Anesthesia Physical Anesthesia Plan  ASA: IV  Anesthesia Plan: General   Post-op Pain Management:    Induction:   PONV Risk Score and Plan: Propofol infusion and TIVA  Airway Management Planned: Natural Airway and Nasal Cannula  Additional Equipment:   Intra-op Plan:   Post-operative Plan:   Informed Consent: I have reviewed the patients History and Physical, chart, labs and discussed the procedure including the risks, benefits and alternatives for the proposed anesthesia with the patient or authorized representative who has indicated his/her understanding and acceptance.   Dental Advisory Given  Plan Discussed with: Anesthesiologist  Anesthesia Plan Comments:  Anesthesia Quick Evaluation  

## 2018-12-26 ENCOUNTER — Inpatient Hospital Stay: Payer: Medicare HMO

## 2018-12-26 ENCOUNTER — Inpatient Hospital Stay (HOSPITAL_BASED_OUTPATIENT_CLINIC_OR_DEPARTMENT_OTHER): Payer: Medicare HMO | Admitting: Oncology

## 2018-12-26 ENCOUNTER — Encounter: Payer: Self-pay | Admitting: Oncology

## 2018-12-26 VITALS — BP 147/65 | HR 52 | Temp 97.2°F | Resp 18 | Ht 69.0 in | Wt 198.4 lb

## 2018-12-26 DIAGNOSIS — D500 Iron deficiency anemia secondary to blood loss (chronic): Secondary | ICD-10-CM

## 2018-12-26 DIAGNOSIS — E538 Deficiency of other specified B group vitamins: Secondary | ICD-10-CM | POA: Diagnosis not present

## 2018-12-26 DIAGNOSIS — D509 Iron deficiency anemia, unspecified: Secondary | ICD-10-CM | POA: Diagnosis not present

## 2018-12-26 MED ORDER — SODIUM CHLORIDE 0.9 % IV SOLN
510.0000 mg | Freq: Once | INTRAVENOUS | Status: AC
  Administered 2018-12-26: 510 mg via INTRAVENOUS
  Filled 2018-12-26: qty 17

## 2018-12-26 MED ORDER — SODIUM CHLORIDE 0.9 % IV SOLN
Freq: Once | INTRAVENOUS | Status: AC
  Administered 2018-12-26: 14:00:00 via INTRAVENOUS
  Filled 2018-12-26: qty 250

## 2018-12-26 NOTE — Anesthesia Postprocedure Evaluation (Signed)
Anesthesia Post Note  Patient: Matthew [name]  Procedure(s) Performed: ENTEROSCOPY Single balloon (N/A )  Patient location during evaluation: PACU Anesthesia Type: General Level of consciousness: awake and alert Pain management: pain level controlled Vital Signs Assessment: post-procedure vital signs reviewed and stable Respiratory status: spontaneous breathing, nonlabored ventilation and respiratory function stable Cardiovascular status: blood pressure returned to baseline and stable Postop Assessment: no apparent nausea or vomiting Anesthetic complications: no     Last Vitals:  Vitals:   12/25/18 1203 12/25/18 1222  BP: (!) 119/55 (!) 142/62  Pulse: 66   Resp: 18   Temp:    SpO2: 97%     Last Pain:  Vitals:   12/26/18 0914  TempSrc:   PainSc: 0-No pain                 Durenda Hurt

## 2018-12-26 NOTE — Progress Notes (Signed)
Hematology/Oncology Consult note [facility]  [phone] [phone]  Patient Care Team: [facility] as PCP - General Wellington Hampshire, MD as Consulting Physician (Cardiology) Daneil Dolin, MD as Consulting Physician (Gastroenterology)   Name of the patient: Matthew [name]  [id number]  02/24/1936   Date of visit: 12/26/18  Diagnosis-iron deficiency anemia possibly secondary to upper GI bleed  Chief complaint/ Reason for visit-routine follow-up of iron deficiency anemia  Heme/Onc history: patient is a 83 year old male with past medical history significant for severe aortic stenosis, mild thrombocytopenia among other medical problems.  He was recently admitted to the hospital for acute symptomatic anemia from upper GI bleed when his hemoglobin dropped down from 10.6-7.  Labs indicated severe iron deficiency.  He underwent EGD and colonoscopy.  Upper endoscopy showed several gastric AVMs which were treated with APC.  2 cm hiatal hernia was also noted.  Colonoscopy showed a large flat polyp in the cecum that was not resected due to poor prep.  Patient has seen Dr. Marius Ditch who plans to do a repeat colonoscopy in January 2020.  Patient was also found to have low B12 levels of 128.  Patient received 2 units of PRBC as well as IV iron in the hospital.  He was subsequently discharged on oral B12 and oral iron 3 times a day which he is taking daily and tolerating it well without any significant side effects.  Interval history- feels fatigued. Denies any blood in stools or dark tarry stools  ECOG PS- 1 Pain scale- 0   Review of systems- Review of Systems  Constitutional: Positive for malaise/fatigue. Negative for chills, fever and weight loss.  HENT: Negative for congestion, ear discharge and nosebleeds.   Eyes: Negative for blurred vision.  Respiratory: Negative for cough, hemoptysis, sputum production, shortness of  breath and wheezing.   Cardiovascular: Negative for chest pain, palpitations, orthopnea and claudication.  Gastrointestinal: Negative for abdominal pain, blood in stool, constipation, diarrhea, heartburn, melena, nausea and vomiting.  Genitourinary: Negative for dysuria, flank pain, frequency, hematuria and urgency.  Musculoskeletal: Negative for back pain, joint pain and myalgias.  Skin: Negative for rash.  Neurological: Negative for dizziness, tingling, focal weakness, seizures, weakness and headaches.  Endo/Heme/Allergies: Does not bruise/bleed easily.  Psychiatric/Behavioral: Negative for depression and suicidal ideas. The patient does not have insomnia.      Allergies  Allergen Reactions  . Ranexa [Ranolazine Er] Rash     Past Medical History:  Diagnosis Date  . AS (aortic stenosis)    a. Echo 6/10: EF 55% mild AS; b. echo 06/2015; EF 55-60%, GR1DD, moderate AS, Peak velocity (S): 346 cm/s. Mean gradientS): 28 mm Hg. Peak gradient (S): 48 mm Hg. Valve area (VTI): 1.18 cm2;   c. Echo 4/17 - mild LVH, EF 55-60%, no RWMA, Gr 1 DD, mod to severe AS (mean 28 mmHg, peak 46 mmHg), mild LAE  . CAD (coronary artery disease)    a. MI 1996 w/ CABG 1996; b.redo CABG 06/2003; c. Myoview 7/09: EF 54% inferobasal infarct, no ischemia. Myoview 6/10 EF 43% inf wall infarct. no ischemia; c. cath 8/16 s/p DES to VG-OM2. OTW 3VD w/ patent VG->RPDA, VG->OM3,  & LIMA->LAD.  EF 55-65%.  . Carotid bruit    2009 0-39% on dopplers bilatrally  . Chest pain   . GERD (gastroesophageal reflux disease)   . History of kidney stones   . HOH (hard of hearing)   . HTN (  hypertension)   . Hyperlipidemia   . Myocardial infarction ([place]) 1996  . Nephrolithiasis   . OSA (obstructive sleep apnea)   . PAD (peripheral artery disease) ([place]) 02/2014   Subtotal occlusion of right common iliac artery and 70% stenosis in the left common iliac artery. Status post bilateral kissing stent placement. Significant post stenosis  aneurysmal dilatation on the right side (any future catheterization through the right femoral artery should be done cautiously to avoid advancing the wire behind the stent struts)  . Shortness of breath    with exertion  . Thrombocytopenia ([place])      Past Surgical History:  Procedure Laterality Date  . ABDOMINAL AORTAGRAM N/A 03/06/2014   Procedure: ABDOMINAL Maxcine Ham;  Surgeon: Wellington Hampshire, MD;  Location: [place] CATH LAB;  Service: Cardiovascular;  Laterality: N/A;  . CARDIAC CATHETERIZATION  02/2014   Severe three-vessel coronary artery disease with patent grafts.   Marland Kitchen CARDIAC CATHETERIZATION N/A 07/31/2015   Procedure: Right/Left Heart Cath and Coronary/Graft Angiography;  Surgeon: Wellington Hampshire, MD;  Location: [place] CV LAB;  Service: Cardiovascular;  Laterality: N/A;  . CARDIAC CATHETERIZATION N/A 07/31/2015   Procedure: Coronary Stent Intervention;  Surgeon: Wellington Hampshire, MD;  Location: [place] CV LAB;  Service: Cardiovascular;  Laterality: N/A;  . CARDIAC CATHETERIZATION N/A 05/26/2016   Procedure: Right/Left Heart Cath and Coronary Angiography;  Surgeon: Wellington Hampshire, MD;  Location: [place] CV LAB;  Service: Cardiovascular;  Laterality: N/A;  . COLONOSCOPY WITH PROPOFOL N/A 10/07/2018   Procedure: COLONOSCOPY WITH PROPOFOL;  Surgeon: Lin Landsman, MD;  Location: [facility] ENDOSCOPY;  Service: Gastroenterology;  Laterality: N/A;  . CORONARY ARTERY BYPASS GRAFT  1991   at [facility]. Redone 2004-3 vessels 1st time and 4 second time  . CYSTO     2/3 times for kidney stones  . ESOPHAGOGASTRODUODENOSCOPY (EGD) WITH PROPOFOL N/A 10/07/2018   Procedure: ESOPHAGOGASTRODUODENOSCOPY (EGD) WITH PROPOFOL;  Surgeon: Lin Landsman, MD;  Location: [place];  Service: Gastroenterology;  Laterality: N/A;  . GIVENS CAPSULE STUDY N/A 12/22/2018   Procedure: GIVENS CAPSULE STUDY;  Surgeon: Lin Landsman, MD;  Location: [facility] ENDOSCOPY;  Service: Gastroenterology;   Laterality: N/A;  . INGUINAL HERNIA REPAIR  11/10/2011   Procedure: HERNIA REPAIR INGUINAL ADULT;  Surgeon: Donato Heinz;  Location: [facility];  Service: General;  Laterality: Right;  . LEFT HEART CATH AND CORONARY ANGIOGRAPHY Left 05/15/2018   Procedure: LEFT HEART CATH AND CORONARY ANGIOGRAPHY;  Surgeon: Wellington Hampshire, MD;  Location: [place] CV LAB;  Service: Cardiovascular;  Laterality: Left;  . LEFT HEART CATHETERIZATION WITH CORONARY/GRAFT ANGIOGRAM N/A 03/06/2014   Procedure: LEFT HEART CATHETERIZATION WITH Beatrix Fetters;  Surgeon: Wellington Hampshire, MD;  Location: [place] CATH LAB;  Service: Cardiovascular;  Laterality: N/A;  . STOMACH SURGERY     removal of gastric ulcers  . TEE WITHOUT CARDIOVERSION N/A 06/01/2016   Procedure: TRANSESOPHAGEAL ECHOCARDIOGRAM (TEE);  Surgeon: Sherren Mocha, MD;  Location: [place];  Service: Open Heart Surgery;  Laterality: N/A;  . TRANSCATHETER AORTIC VALVE REPLACEMENT, TRANSFEMORAL N/A 06/01/2016   Procedure: TRANSCATHETER AORTIC VALVE REPLACEMENT, TRANSFEMORAL;  Surgeon: Sherren Mocha, MD;  Location: [place];  Service: Open Heart Surgery;  Laterality: N/A;    Social History   Socioeconomic History  . Marital status: Married    Spouse name: Not on file  . Number of children: 2  . Years of education: Not on file  . Highest education level: Not on file  Occupational  History  . Occupation: Retired  Scientific laboratory technician  . Financial resource strain: Not on file  . Food insecurity:    Worry: Not on file    Inability: Not on file  . Transportation needs:    Medical: Not on file    Non-medical: Not on file  Tobacco Use  . Smoking status: Former Smoker    Packs/day: 1.00    Years: 50.00    Pack years: 50.00    Types: Cigarettes    Last attempt to quit: 01/24/1990    Years since quitting: 28.9  . Smokeless tobacco: Never Used  Substance and Sexual Activity  . Alcohol use: No    Alcohol/week: 0.0 standard drinks    Comment: Alcoholic  .  Drug use: No  . Sexual activity: Never  Lifestyle  . Physical activity:    Days per week: Not on file    Minutes per session: Not on file  . Stress: Not on file  Relationships  . Social connections:    Talks on phone: Not on file    Gets together: Not on file    Attends religious service: Not on file    Active member of club or organization: Not on file    Attends meetings of clubs or organizations: Not on file    Relationship status: Not on file  . Intimate partner violence:    Fear of current or ex partner: Not on file    Emotionally abused: Not on file    Physically abused: Not on file    Forced sexual activity: Not on file  Other Topics Concern  . Not on file  Social History Narrative   Married an lives with wife in [place]. Retired form [employer].     Family History  Problem Relation Age of Onset  . Heart disease Father   . Heart disease Mother      Current Outpatient Medications:  .  acetaminophen (TYLENOL) 325 MG tablet, Take 650 mg by mouth daily as needed for mild pain or headache. , Disp: , Rfl:  .  aspirin EC 81 MG tablet, Take 1 tablet (81 mg total) by mouth daily., Disp: 90 tablet, Rfl: 3 .  atorvastatin (LIPITOR) 80 MG tablet, Take 1 tablet (80 mg total) by mouth daily., Disp: 90 tablet, Rfl: 1 .  carvedilol (COREG) 3.125 MG tablet, Take 1 tablet (3.125 mg total) by mouth 2 (two) times daily., Disp: 180 tablet, Rfl: 1 .  gabapentin (NEURONTIN) 100 MG capsule, Take 1-3 capsules (100-300 mg total) by mouth 4 (four) times daily. Follow written titration schedule., Disp: 360 capsule, Rfl: 2 .  GARLIC PO, Take by mouth daily., Disp: , Rfl:  .  Multiple Vitamins-Minerals (PRESERVISION AREDS PO), Take 1 tablet by mouth daily. , Disp: , Rfl:  .  nitroGLYCERIN (NITROSTAT) 0.4 MG SL tablet, Place 0.4 mg under the tongue every 5 (five) minutes as needed for chest pain., Disp: , Rfl:  .  Omega-3 Fatty Acids (FISH OIL PO), Take by mouth daily., Disp: , Rfl:  .   pantoprazole (PROTONIX) 40 MG tablet, Take 1 tablet (40 mg total) by mouth daily., Disp: 90 tablet, Rfl: 3 .  sacubitril-valsartan (ENTRESTO) 24-26 MG, Take 1 tablet by mouth 2 (two) times daily., Disp: 180 tablet, Rfl: 3 .  tamsulosin (FLOMAX) 0.4 MG CAPS capsule, Take 1 capsule (0.4 mg total) by mouth daily., Disp: 90 capsule, Rfl: 3 .  traMADol (ULTRAM) 50 MG tablet, Take 1 tablet (50 mg total) daily  by mouth. (Patient taking differently: Take 50 mg by mouth daily as needed. ), Disp: 90 tablet, Rfl: 0 .  TURMERIC PO, Take at bedtime by mouth., Disp: , Rfl:   Physical exam:  Vitals:   12/26/18 1330  BP: (!) 147/65  Pulse: (!) 52  Resp: 18  Temp: (!) 97.2 F (36.2 C)  TempSrc: Oral  Weight: 198 lb 6.4 oz (90 kg)  Height: 5\' 9"  (1.753 m)   Physical Exam HENT:     Head: Normocephalic and atraumatic.  Eyes:     Pupils: Pupils are equal, round, and reactive to light.  Neck:     Musculoskeletal: Normal range of motion.  Cardiovascular:     Rate and Rhythm: Normal rate and regular rhythm.     Heart sounds: Normal heart sounds.  Pulmonary:     Effort: Pulmonary effort is normal.     Breath sounds: Normal breath sounds.  Abdominal:     General: Bowel sounds are normal.     Palpations: Abdomen is soft.  Skin:    General: Skin is warm and dry.  Neurological:     Mental Status: He is alert and oriented to person, place, and time.      CMP Latest Ref Rng & Units 12/18/2018  Glucose 65 - 99 mg/dL 135(H)  BUN 8 - 27 mg/dL 17  Creatinine 0.76 - 1.27 mg/dL 0.94  Sodium 134 - 144 mmol/L 143  Potassium 3.5 - 5.2 mmol/L 3.7  Chloride 96 - 106 mmol/L 109(H)  CO2 20 - 29 mmol/L 20  Calcium 8.6 - 10.2 mg/dL 9.0  Total Protein 6.0 - 8.5 g/dL -  Total Bilirubin 0.0 - 1.2 mg/dL -  Alkaline Phos 39 - 117 IU/L -  AST 0 - 40 IU/L -  ALT 0 - 44 IU/L -   CBC Latest Ref Rng & Units 12/19/2018  WBC 4.0 - 10.5 K/uL 5.0  Hemoglobin 13.0 - 17.0 g/dL 10.0(L)  Hematocrit 39.0 - 52.0 % 32.3(L)    Platelets 150 - 400 K/uL 123(L)      Assessment and plan- Patient is a 83 y.o. male with iron deficiency anemia possibly secondary to upper GI bleeding  When patient saw me in November 2019 his hemoglobin had improved significantly from 8.5-12.4 after taking oral iron.  I therefore did not proceed with IV iron at that time.  However repeat labs on 12/19/2018 showed that his hemoglobin drifted back down to 10.  Iron studies showed a low ferritin of 6 and.  Low iron saturation of 5%.  This is concerning for ongoing GI blood loss.  I did inform Dr. 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  Argon plasma was used for hemostasis.  There is also plan for colonoscopy in the near future  At this point I will proceed with 2 doses of Feraheme 510 mg IV weekly and he will get his first dose today.  Discussed risks and benefits of Feraheme including all but not limited to headaches, leg swelling and possible risk of infusion reaction.  Patient understands and agrees to proceed as planned.  Repeat CBC ferritin and iron studies in 2 months in 4 months and I will see him back in 4 months  B12 deficiency: Recent levels were 219 and patient is currently taking oral B12 thousand micrograms.  We will repeat his B12 levels in 4 months   Visit  Diagnosis 1. Iron deficiency anemia, unspecified iron deficiency anemia type   2. B12 deficiency      Dr. Randa Evens, MD, MPH [facility] [phone] 12/26/2018 1:56 PM

## 2018-12-26 NOTE — Progress Notes (Signed)
Here to get feraheme today. syv

## 2018-12-27 ENCOUNTER — Encounter: Payer: Self-pay | Admitting: Gastroenterology

## 2018-12-28 ENCOUNTER — Other Ambulatory Visit: Payer: Self-pay

## 2018-12-28 ENCOUNTER — Encounter: Payer: Self-pay | Admitting: Gastroenterology

## 2018-12-28 ENCOUNTER — Ambulatory Visit: Payer: Medicare HMO | Admitting: Pain Medicine

## 2019-01-02 ENCOUNTER — Telehealth: Payer: Self-pay | Admitting: *Deleted

## 2019-01-02 ENCOUNTER — Inpatient Hospital Stay: Payer: Medicare HMO

## 2019-01-02 VITALS — BP 144/69 | HR 50 | Temp 96.0°F | Resp 19

## 2019-01-02 DIAGNOSIS — D509 Iron deficiency anemia, unspecified: Secondary | ICD-10-CM | POA: Diagnosis not present

## 2019-01-02 DIAGNOSIS — D500 Iron deficiency anemia secondary to blood loss (chronic): Secondary | ICD-10-CM

## 2019-01-02 MED ORDER — SODIUM CHLORIDE 0.9 % IV SOLN
510.0000 mg | Freq: Once | INTRAVENOUS | Status: AC
  Administered 2019-01-02: 510 mg via INTRAVENOUS
  Filled 2019-01-02: qty 17

## 2019-01-02 MED ORDER — SODIUM CHLORIDE 0.9 % IV SOLN
Freq: Once | INTRAVENOUS | Status: AC
  Administered 2019-01-02: 14:00:00 via INTRAVENOUS
  Filled 2019-01-02: qty 250

## 2019-01-02 MED ORDER — SACUBITRIL-VALSARTAN 24-26 MG PO TABS: 1.0000 | ORAL_TABLET | Freq: Two times a day (BID) | ORAL | 3 refills | Status: DC

## 2019-01-02 NOTE — Telephone Encounter (Signed)
Refill on Entresto sent to [facility] per the patient's daughter request. Please see patient advice request encounter.

## 2019-01-03 MED ORDER — SACUBITRIL-VALSARTAN 24-26 MG PO TABS: 1.0000 | ORAL_TABLET | Freq: Two times a day (BID) | ORAL | 0 refills | Status: DC

## 2019-01-08 ENCOUNTER — Ambulatory Visit: Payer: Medicare HMO | Admitting: Anesthesiology

## 2019-01-08 ENCOUNTER — Encounter: Payer: Self-pay | Admitting: Gastroenterology

## 2019-01-08 ENCOUNTER — Encounter
Admission: RE | Disposition: A | Discharge status: Home / Self Care | Payer: Self-pay | Source: Home / Self Care | Attending: Gastroenterology

## 2019-01-08 ENCOUNTER — Ambulatory Visit
Admission: RE | Admit: 2019-01-08 | Discharge: 2019-01-08 | Disposition: A | Discharge status: Home / Self Care | Payer: Medicare HMO | Attending: Gastroenterology | Admitting: Gastroenterology

## 2019-01-08 ENCOUNTER — Other Ambulatory Visit: Payer: Self-pay

## 2019-01-08 ENCOUNTER — Encounter: Payer: Self-pay | Admitting: Anesthesiology

## 2019-01-08 DIAGNOSIS — D696 Thrombocytopenia, unspecified: Secondary | ICD-10-CM | POA: Diagnosis not present

## 2019-01-08 DIAGNOSIS — K5730 Diverticulosis of large intestine without perforation or abscess without bleeding: Secondary | ICD-10-CM | POA: Insufficient documentation

## 2019-01-08 DIAGNOSIS — Z87442 Personal history of urinary calculi: Secondary | ICD-10-CM | POA: Diagnosis not present

## 2019-01-08 DIAGNOSIS — I10 Essential (primary) hypertension: Secondary | ICD-10-CM | POA: Insufficient documentation

## 2019-01-08 DIAGNOSIS — D123 Benign neoplasm of transverse colon: Secondary | ICD-10-CM | POA: Insufficient documentation

## 2019-01-08 DIAGNOSIS — Z7982 Long term (current) use of aspirin: Secondary | ICD-10-CM | POA: Insufficient documentation

## 2019-01-08 DIAGNOSIS — Z86010 Personal history of colonic polyps: Secondary | ICD-10-CM | POA: Insufficient documentation

## 2019-01-08 DIAGNOSIS — K635 Polyp of colon: Secondary | ICD-10-CM

## 2019-01-08 DIAGNOSIS — D122 Benign neoplasm of ascending colon: Secondary | ICD-10-CM | POA: Insufficient documentation

## 2019-01-08 DIAGNOSIS — Z87891 Personal history of nicotine dependence: Secondary | ICD-10-CM | POA: Insufficient documentation

## 2019-01-08 DIAGNOSIS — Z79899 Other long term (current) drug therapy: Secondary | ICD-10-CM | POA: Insufficient documentation

## 2019-01-08 DIAGNOSIS — G4733 Obstructive sleep apnea (adult) (pediatric): Secondary | ICD-10-CM | POA: Insufficient documentation

## 2019-01-08 DIAGNOSIS — M1990 Unspecified osteoarthritis, unspecified site: Secondary | ICD-10-CM | POA: Diagnosis not present

## 2019-01-08 DIAGNOSIS — E785 Hyperlipidemia, unspecified: Secondary | ICD-10-CM | POA: Diagnosis not present

## 2019-01-08 DIAGNOSIS — Z951 Presence of aortocoronary bypass graft: Secondary | ICD-10-CM | POA: Insufficient documentation

## 2019-01-08 DIAGNOSIS — I252 Old myocardial infarction: Secondary | ICD-10-CM | POA: Diagnosis not present

## 2019-01-08 DIAGNOSIS — D125 Benign neoplasm of sigmoid colon: Secondary | ICD-10-CM | POA: Insufficient documentation

## 2019-01-08 DIAGNOSIS — Z955 Presence of coronary angioplasty implant and graft: Secondary | ICD-10-CM | POA: Insufficient documentation

## 2019-01-08 DIAGNOSIS — Z888 Allergy status to other drugs, medicaments and biological substances status: Secondary | ICD-10-CM | POA: Insufficient documentation

## 2019-01-08 DIAGNOSIS — I739 Peripheral vascular disease, unspecified: Secondary | ICD-10-CM | POA: Insufficient documentation

## 2019-01-08 DIAGNOSIS — D120 Benign neoplasm of cecum: Secondary | ICD-10-CM | POA: Insufficient documentation

## 2019-01-08 DIAGNOSIS — I25119 Atherosclerotic heart disease of native coronary artery with unspecified angina pectoris: Secondary | ICD-10-CM | POA: Insufficient documentation

## 2019-01-08 DIAGNOSIS — Z8249 Family history of ischemic heart disease and other diseases of the circulatory system: Secondary | ICD-10-CM | POA: Diagnosis not present

## 2019-01-08 DIAGNOSIS — H9190 Unspecified hearing loss, unspecified ear: Secondary | ICD-10-CM | POA: Diagnosis not present

## 2019-01-08 DIAGNOSIS — Z952 Presence of prosthetic heart valve: Secondary | ICD-10-CM | POA: Diagnosis not present

## 2019-01-08 DIAGNOSIS — R0602 Shortness of breath: Secondary | ICD-10-CM | POA: Insufficient documentation

## 2019-01-08 DIAGNOSIS — K219 Gastro-esophageal reflux disease without esophagitis: Secondary | ICD-10-CM | POA: Insufficient documentation

## 2019-01-08 DIAGNOSIS — D649 Anemia, unspecified: Secondary | ICD-10-CM | POA: Insufficient documentation

## 2019-01-08 DIAGNOSIS — Z1211 Encounter for screening for malignant neoplasm of colon: Secondary | ICD-10-CM | POA: Insufficient documentation

## 2019-01-08 DIAGNOSIS — D124 Benign neoplasm of descending colon: Secondary | ICD-10-CM | POA: Insufficient documentation

## 2019-01-08 HISTORY — PX: COLONOSCOPY WITH PROPOFOL: SHX5780

## 2019-01-08 SURGERY — COLONOSCOPY WITH PROPOFOL
Anesthesia: General

## 2019-01-08 MED ORDER — GLYCOPYRROLATE 0.2 MG/ML IJ SOLN
INTRAMUSCULAR | Status: AC
  Filled 2019-01-08: qty 1

## 2019-01-08 MED ORDER — PROPOFOL 10 MG/ML IV BOLUS
INTRAVENOUS | Status: DC | PRN
  Administered 2019-01-08: 50 mg via INTRAVENOUS
  Administered 2019-01-08: 20 mg via INTRAVENOUS

## 2019-01-08 MED ORDER — PROPOFOL 500 MG/50ML IV EMUL
INTRAVENOUS | Status: DC | PRN
  Administered 2019-01-08: 100 ug/kg/min via INTRAVENOUS

## 2019-01-08 MED ORDER — EPHEDRINE SULFATE 50 MG/ML IJ SOLN
INTRAMUSCULAR | Status: AC
  Filled 2019-01-08: qty 1

## 2019-01-08 MED ORDER — LIDOCAINE HCL (PF) 2 % IJ SOLN
INTRAMUSCULAR | Status: AC
  Filled 2019-01-08: qty 10

## 2019-01-08 MED ORDER — FENTANYL CITRATE (PF) 100 MCG/2ML IJ SOLN: 25.0000 ug | INTRAMUSCULAR | Status: DC | PRN

## 2019-01-08 MED ORDER — LIDOCAINE HCL (CARDIAC) PF 100 MG/5ML IV SOSY
PREFILLED_SYRINGE | INTRAVENOUS | Status: DC | PRN
  Administered 2019-01-08: 60 mg via INTRATRACHEAL

## 2019-01-08 MED ORDER — GLYCOPYRROLATE 0.2 MG/ML IJ SOLN
INTRAMUSCULAR | Status: DC | PRN
  Administered 2019-01-08: 0.1 mg via INTRAVENOUS

## 2019-01-08 MED ORDER — PROPOFOL 10 MG/ML IV BOLUS
INTRAVENOUS | Status: AC
  Filled 2019-01-08: qty 60

## 2019-01-08 MED ORDER — EPINEPHRINE PF 1 MG/ML IJ SOLN
INTRAMUSCULAR | Status: DC | PRN
  Administered 2019-01-08: 2 mg

## 2019-01-08 MED ORDER — PHENYLEPHRINE HCL 10 MG/ML IJ SOLN
INTRAMUSCULAR | Status: DC | PRN
  Administered 2019-01-08 (×5): 50 ug via INTRAVENOUS

## 2019-01-08 MED ORDER — SODIUM CHLORIDE 0.9 % IV SOLN
INTRAVENOUS | Status: DC
  Administered 2019-01-08: 1000 mL via INTRAVENOUS

## 2019-01-08 MED ORDER — PHENYLEPHRINE HCL 10 MG/ML IJ SOLN
INTRAMUSCULAR | Status: AC
  Filled 2019-01-08: qty 1

## 2019-01-08 MED ORDER — EPHEDRINE SULFATE 50 MG/ML IJ SOLN
INTRAMUSCULAR | Status: DC | PRN
  Administered 2019-01-08 (×5): 5 mg via INTRAVENOUS

## 2019-01-08 MED ORDER — ONDANSETRON HCL 4 MG/2ML IJ SOLN: 4.0000 mg | Freq: Once | INTRAMUSCULAR | Status: DC | PRN

## 2019-01-08 NOTE — Anesthesia Preprocedure Evaluation (Signed)
Anesthesia Evaluation  Patient identified by MRN, date of birth, ID band Patient awake    Reviewed: Allergy & Precautions, H&P , NPO status , Patient's Chart, lab work & pertinent test results  History of Anesthesia Complications Negative for: history of anesthetic complications  Airway Mallampati: III  TM Distance: <3 FB Neck ROM: full    Dental  (+) Chipped, Edentulous Upper, Edentulous Lower   Pulmonary shortness of breath and with exertion, sleep apnea , former smoker,           Cardiovascular Exercise Tolerance: Good hypertension, + angina + CAD, + Past MI, + CABG and + Peripheral Vascular Disease       Neuro/Psych  Neuromuscular disease negative psych ROS   GI/Hepatic Neg liver ROS, GERD  Medicated and Controlled,  Endo/Other  negative endocrine ROS  Renal/GU Renal diseasenegative Renal ROS  negative genitourinary   Musculoskeletal  (+) Arthritis ,   Abdominal   Peds  Hematology negative hematology ROS (+) anemia ,   Anesthesia Other Findings Past Medical History: No date: AS (aortic stenosis)     Comment:  a. Echo 6/10: EF 55% mild AS; b. echo 06/2015; EF 55-60%,              GR1DD, moderate AS, Peak velocity (S): 346 cm/s. Mean               gradientS): 28 mm Hg. Peak gradient (S): 48 mm Hg. Valve               area (VTI): 1.18 cm2;   c. Echo 4/17 - mild LVH, EF               55-60%, no RWMA, Gr 1 DD, mod to severe AS (mean 28 mmHg,              peak 46 mmHg), mild LAE No date: CAD (coronary artery disease)     Comment:  a. MI 1996 w/ CABG 1996; b.redo CABG 06/2003; c. Myoview               7/09: EF 54% inferobasal infarct, no ischemia. Myoview               6/10 EF 43% inf wall infarct. no ischemia; c. cath 8/16               s/p DES to VG-OM2. OTW 3VD w/ patent VG->RPDA, VG->OM3,                & LIMA->LAD.  EF 55-65%. No date: Carotid bruit     Comment:  2009 0-39% on dopplers bilatrally No date:  GERD (gastroesophageal reflux disease) No date: History of kidney stones No date: HOH (hard of hearing) No date: HTN (hypertension) No date: Hyperlipidemia 1996: Myocardial infarction ([place]) No date: Nephrolithiasis No date: OSA (obstructive sleep apnea) 02/2014: PAD (peripheral artery disease) ([facility])     Comment:  Subtotal occlusion of right common iliac artery and 70%               stenosis in the left common iliac artery. Status post               bilateral kissing stent placement. Significant post               stenosis aneurysmal dilatation on the right side (any               future catheterization through the right femoral artery  should be done cautiously to avoid advancing the wire               behind the stent struts) No date: Shortness of breath     Comment:  with exertion No date: Thrombocytopenia ([place])  Past Surgical History: 03/06/2014: ABDOMINAL Maxcine Ham; N/A     Comment:  Procedure: ABDOMINAL AORTAGRAM;  Surgeon: Wellington Hampshire, MD;  Location: [place] CATH LAB;  Service:               Cardiovascular;  Laterality: N/A; 02/2014: CARDIAC CATHETERIZATION     Comment:  Severe three-vessel coronary artery disease with patent               grafts.  07/31/2015: CARDIAC CATHETERIZATION; N/A     Comment:  Procedure: Right/Left Heart Cath and Coronary/Graft               Angiography;  Surgeon: Wellington Hampshire, MD;  Location:               [place] CV LAB;  Service: Cardiovascular;                Laterality: N/A; 07/31/2015: CARDIAC CATHETERIZATION; N/A     Comment:  Procedure: Coronary Stent Intervention;  Surgeon:               Wellington Hampshire, MD;  Location: [place] CV LAB;                Service: Cardiovascular;  Laterality: N/A; 05/26/2016: CARDIAC CATHETERIZATION; N/A     Comment:  Procedure: Right/Left Heart Cath and Coronary               Angiography;  Surgeon: Wellington Hampshire, MD;  Location:               [place] CV LAB;   Service: Cardiovascular;                Laterality: N/A; 1991: CORONARY ARTERY BYPASS GRAFT     Comment:  at [facility] Redone 2004-3 vessels 1st time and 4 second               time No date: CYSTO     Comment:  2/3 times for kidney stones 11/10/2011: INGUINAL HERNIA REPAIR     Comment:  Procedure: HERNIA REPAIR INGUINAL ADULT;  Surgeon: Donato Heinz;  [facility]: [facility];  Service: General;                Laterality: Right; 05/15/2018: LEFT HEART CATH AND CORONARY ANGIOGRAPHY; Left     Comment:  Procedure: LEFT HEART CATH AND CORONARY ANGIOGRAPHY;                Surgeon: Wellington Hampshire, MD;  Location: [place]               CV LAB;  Service: Cardiovascular;  Laterality: Left; 03/06/2014: LEFT HEART CATHETERIZATION WITH CORONARY/GRAFT ANGIOGRAM;  N/A     Comment:  Procedure: LEFT HEART CATHETERIZATION WITH               Beatrix Fetters;  Surgeon: Wellington Hampshire, MD;              Location: [place] CATH LAB;  Service: Cardiovascular;  Laterality: N/A; No date: STOMACH SURGERY     Comment:  removal of gastric ulcers 06/01/2016: TEE WITHOUT CARDIOVERSION; N/A     Comment:  Procedure: TRANSESOPHAGEAL ECHOCARDIOGRAM (TEE);                Surgeon: Sherren Mocha, MD;  Location: [place];  Service:               Open Heart Surgery;  Laterality: N/A; 06/01/2016: TRANSCATHETER AORTIC VALVE REPLACEMENT, TRANSFEMORAL; N/A     Comment:  Procedure: TRANSCATHETER AORTIC VALVE REPLACEMENT,               TRANSFEMORAL;  Surgeon: Sherren Mocha, MD;  Location: [place];  Service: Open Heart Surgery;  Laterality: N/A;  BMI    Body Mass Index:  28.28 kg/m      Reproductive/Obstetrics negative OB ROS                             Anesthesia Physical  Anesthesia Plan  ASA: III  Anesthesia Plan: General   Post-op Pain Management:    Induction: Intravenous  PONV Risk Score and Plan: Propofol infusion and TIVA  Airway Management  Planned: Natural Airway and Nasal Cannula  Additional Equipment:   Intra-op Plan:   Post-operative Plan:   Informed Consent: I have reviewed the patients History and Physical, chart, labs and discussed the procedure including the risks, benefits and alternatives for the proposed anesthesia with the patient or authorized representative who has indicated his/her understanding and acceptance.     Dental Advisory Given  Plan Discussed with: Anesthesiologist, CRNA and Surgeon  Anesthesia Plan Comments: (Patient consented for risks of anesthesia including but not limited to:  - adverse reactions to medications - risk of intubation if required - damage to teeth, lips or other oral mucosa - sore throat or hoarseness - Damage to heart, brain, lungs or loss of life  Patient voiced understanding.)        Anesthesia Quick Evaluation

## 2019-01-08 NOTE — Transfer of Care (Signed)
Immediate Anesthesia Transfer of Care Note  Patient: Matthew [name]  Procedure(s) Performed: COLONOSCOPY WITH PROPOFOL (N/A )  Patient Location: Endoscopy Unit  Anesthesia Type:General  Level of Consciousness: awake  Airway & Oxygen Therapy: Patient Spontanous Breathing and Patient connected to nasal cannula oxygen  Post-op Assessment: Report given to RN and Post -op Vital signs reviewed and stable  Post vital signs: stable  Last Vitals:  Vitals Value Taken Time  BP 128/58 01/08/2019 10:24 AM  Temp 36.2 C 01/08/2019 10:23 AM  Pulse 67 01/08/2019 10:30 AM  Resp 17 01/08/2019 10:30 AM  SpO2 97 % 01/08/2019 10:30 AM  Vitals shown include unvalidated device data.  Last Pain:  Vitals:   01/08/19 1023  TempSrc: Tympanic  PainSc: 0-No pain         Complications: No apparent anesthesia complications

## 2019-01-08 NOTE — H&P (Signed)
Cephas Darby, MD [address]  [address]  [place], [place] [address]  Main: [phone]  Fax: [phone] Pager: [phone]  Primary Care Physician:  [facility]:  Dr. Cephas Darby  Pre-Procedure History & Physical: HPI:  Matthew [name] is a 83 y.o. male is here for an colonoscopy.   Past Medical History:  Diagnosis Date  . Alcohol abuse    stopped drinking 42 years ago from today 2020  . Anemia   . AS (aortic stenosis)    a. Echo 6/10: EF 55% mild AS; b. echo 06/2015; EF 55-60%, GR1DD, moderate AS, Peak velocity (S): 346 cm/s. Mean gradientS): 28 mm Hg. Peak gradient (S): 48 mm Hg. Valve area (VTI): 1.18 cm2;   c. Echo 4/17 - mild LVH, EF 55-60%, no RWMA, Gr 1 DD, mod to severe AS (mean 28 mmHg, peak 46 mmHg), mild LAE  . CAD (coronary artery disease)    a. MI 1996 w/ CABG 1996; b.redo CABG 06/2003; c. Myoview 7/09: EF 54% inferobasal infarct, no ischemia. Myoview 6/10 EF 43% inf wall infarct. no ischemia; c. cath 8/16 s/p DES to VG-OM2. OTW 3VD w/ patent VG->RPDA, VG->OM3,  & LIMA->LAD.  EF 55-65%.  . Carotid bruit    2009 0-39% on dopplers bilatrally  . Chest pain   . GERD (gastroesophageal reflux disease)   . History of kidney stones   . HOH (hard of hearing)   . HTN (hypertension)   . Hyperlipidemia   . Myocardial infarction ([place]) 1996  . Nephrolithiasis   . OSA (obstructive sleep apnea)   . PAD (peripheral artery disease) ([place]) 02/2014   Subtotal occlusion of right common iliac artery and 70% stenosis in the left common iliac artery. Status post bilateral kissing stent placement. 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 Shortness of breath    with exertion  . Thrombocytopenia ([place])     Past Surgical History:  Procedure Laterality Date  .  ABDOMINAL AORTAGRAM N/A 03/06/2014   Procedure: ABDOMINAL Maxcine Ham;  Surgeon: Wellington Hampshire, MD;  Location: [facility] CATH LAB;  Service: Cardiovascular;  Laterality: N/A;  . CARDIAC CATHETERIZATION  02/2014   Severe three-vessel coronary artery disease with patent grafts.   Marland Kitchen CARDIAC CATHETERIZATION N/A 07/31/2015   Procedure: Right/Left Heart Cath and Coronary/Graft Angiography;  Surgeon: Wellington Hampshire, MD;  Location: [facility] CV LAB;  Service: Cardiovascular;  Laterality: N/A;  . CARDIAC CATHETERIZATION N/A 07/31/2015   Procedure: Coronary Stent Intervention;  Surgeon: Wellington Hampshire, MD;  Location: [facility] CV LAB;  Service: Cardiovascular;  Laterality: N/A;  . CARDIAC CATHETERIZATION N/A 05/26/2016   Procedure: Right/Left Heart Cath and Coronary Angiography;  Surgeon: Wellington Hampshire, MD;  Location: [place] CV LAB;  Service: Cardiovascular;  Laterality: N/A;  . COLONOSCOPY WITH PROPOFOL N/A 10/07/2018   Procedure: COLONOSCOPY WITH PROPOFOL;  Surgeon: Lin Landsman, MD;  Location: [facility] ENDOSCOPY;  Service: Gastroenterology;  Laterality: N/A;  . CORONARY ARTERY BYPASS GRAFT  1991   at [facility]. Redone 2004-3 vessels 1st time and 4 second time  . CYSTO     2/3 times for kidney stones  . ENTEROSCOPY N/A 12/25/2018   Procedure: ENTEROSCOPY Single balloon;  Surgeon: Lin Landsman, MD;  Location: [place];  Service: Gastroenterology;  Laterality: N/A;  . ESOPHAGOGASTRODUODENOSCOPY (EGD)  WITH PROPOFOL N/A 10/07/2018   Procedure: ESOPHAGOGASTRODUODENOSCOPY (EGD) WITH PROPOFOL;  Surgeon: Lin Landsman, MD;  Location: [facility] ENDOSCOPY;  Service: Gastroenterology;  Laterality: N/A;  . GIVENS CAPSULE STUDY N/A 12/22/2018   Procedure: GIVENS CAPSULE STUDY;  Surgeon: Lin Landsman, MD;  Location: [facility] ENDOSCOPY;  Service: Gastroenterology;  Laterality: N/A;  . INGUINAL HERNIA REPAIR  11/10/2011   Procedure: HERNIA REPAIR INGUINAL ADULT;  Surgeon: Donato Heinz;   Location: [facility];  Service: General;  Laterality: Right;  . LEFT HEART CATH AND CORONARY ANGIOGRAPHY Left 05/15/2018   Procedure: LEFT HEART CATH AND CORONARY ANGIOGRAPHY;  Surgeon: Wellington Hampshire, MD;  Location: [place] CV LAB;  Service: Cardiovascular;  Laterality: Left;  . LEFT HEART CATHETERIZATION WITH CORONARY/GRAFT ANGIOGRAM N/A 03/06/2014   Procedure: LEFT HEART CATHETERIZATION WITH Beatrix Fetters;  Surgeon: Wellington Hampshire, MD;  Location: [facility] CATH LAB;  Service: Cardiovascular;  Laterality: N/A;  . STOMACH SURGERY     removal of gastric ulcers  . TEE WITHOUT CARDIOVERSION N/A 06/01/2016   Procedure: TRANSESOPHAGEAL ECHOCARDIOGRAM (TEE);  Surgeon: Sherren Mocha, MD;  Location: [place];  Service: Open Heart Surgery;  Laterality: N/A;  . TRANSCATHETER AORTIC VALVE REPLACEMENT, TRANSFEMORAL N/A 06/01/2016   Procedure: TRANSCATHETER AORTIC VALVE REPLACEMENT, TRANSFEMORAL;  Surgeon: Sherren Mocha, MD;  Location: [place];  Service: Open Heart Surgery;  Laterality: N/A;    Prior to Admission medications   Medication Sig Start Date End Date Taking? Authorizing Provider  acetaminophen (TYLENOL) 325 MG tablet Take 650 mg by mouth daily as needed for mild pain or headache.    Yes [provider]  aspirin EC 81 MG tablet Take 1 tablet (81 mg total) by mouth daily. 03/22/14  Yes Wellington Hampshire, MD  carvedilol (COREG) 3.125 MG tablet Take 1 tablet (3.125 mg total) by mouth 2 (two) times daily. 07/05/18  Yes Dunn, Ryan M, PA-C  GARLIC PO Take 1 capsule by mouth daily.    Yes [provider]  Multiple Vitamins-Minerals (PRESERVISION AREDS PO) Take 1 tablet by mouth daily.    Yes [provider]  nitroGLYCERIN (NITROSTAT) 0.4 MG SL tablet Place 0.4 mg under the tongue every 5 (five) minutes as needed for chest pain.   Yes [provider]  Omega-3 Fatty Acids (FISH OIL PO) Take 1 capsule by mouth daily.    Yes [provider]  pantoprazole  (PROTONIX) 40 MG tablet Take 1 tablet (40 mg total) by mouth daily. 05/31/18  Yes Dunn, Ryan M, PA-C  sacubitril-valsartan (ENTRESTO) 24-26 MG Take 1 tablet by mouth 2 (two) times daily. 01/02/19  Yes Wellington Hampshire, MD  tamsulosin (FLOMAX) 0.4 MG CAPS capsule Take 1 capsule (0.4 mg total) by mouth daily. 06/21/18  Yes Dunn, Areta Haber, PA-C  TURMERIC PO Take 1 capsule by mouth at bedtime.    Yes [provider]  atorvastatin (LIPITOR) 80 MG tablet Take 1 tablet (80 mg total) by mouth daily. Patient not taking: Reported on 01/08/2019 05/23/18   Wellington Hampshire, MD  gabapentin (NEURONTIN) 100 MG capsule Take 1-3 capsules (100-300 mg total) by mouth 4 (four) times daily. Follow written titration schedule. Patient not taking: Reported on 12/26/2018 05/15/18 12/01/18  Vevelyn Francois, NP  sacubitril-valsartan (ENTRESTO) 24-26 MG Take 1 tablet by mouth 2 (two) times daily. Patient not taking: Reported on 01/08/2019 01/03/19   Wellington Hampshire, MD  traMADol (ULTRAM) 50 MG tablet Take 1 tablet (50 mg total) daily by mouth. Patient taking  differently: Take 50 mg by mouth daily as needed.  12/12/17 12/26/18  Milinda Pointer, MD    Allergies as of 10/11/2018 - Review Complete 10/07/2018  Allergen Reaction Noted  . Ranexa [ranolazine er] Rash 07/19/2018    Family History  Problem Relation Age of Onset  . Heart disease Father   . Heart disease Mother     Social History   Socioeconomic History  . Marital status: Married    Spouse name: Not on file  . Number of children: 2  . Years of education: Not on file  . Highest education level: Not on file  Occupational History  . Occupation: Retired  Scientific laboratory technician  . Financial resource strain: Not on file  . Food insecurity:    Worry: Not on file    Inability: Not on file  . Transportation needs:    Medical: Not on file    Non-medical: Not on file  Tobacco Use  . Smoking status: Former Smoker    Packs/day: 1.00    Years: 50.00    Pack  years: 50.00    Types: Cigarettes    Last attempt to quit: 01/24/1990    Years since quitting: 28.9  . Smokeless tobacco: Never Used  Substance and Sexual Activity  . Alcohol use: No    Alcohol/week: 0.0 standard drinks    Comment: Alcoholic in past  . Drug use: No  . Sexual activity: Never  Lifestyle  . Physical activity:    Days per week: Not on file    Minutes per session: Not on file  . Stress: Not on file  Relationships  . Social connections:    Talks on phone: Not on file    Gets together: Not on file    Attends religious service: Not on file    Active member of club or organization: Not on file    Attends meetings of clubs or organizations: Not on file    Relationship status: Not on file  . Intimate partner violence:    Fear of current or ex partner: Not on file    Emotionally abused: Not on file    Physically abused: Not on file    Forced sexual activity: Not on file  Other Topics Concern  . Not on file  Social History Narrative   Married an lives with wife in [place]. Retired form [employer].     Review of Systems: See HPI, otherwise negative ROS  Physical Exam: BP (!) 161/66   Pulse (!) 55   Temp (!) 97.5 F (36.4 C) (Tympanic)   Resp 20   Ht 5\' 9"  (1.753 m)   Wt 88.5 kg   SpO2 99%   BMI 28.80 kg/m  General:   Alert,  pleasant and cooperative in NAD Head:  Normocephalic and atraumatic. Neck:  Supple; no masses or thyromegaly. Lungs:  Clear throughout to auscultation.    Heart:  Regular rate and rhythm. Abdomen:  Soft, nontender and nondistended. Normal bowel sounds, without guarding, and without rebound.   Neurologic:  Alert and  oriented x4;  grossly normal neurologically.  Impression/Plan: Matthew [name] is here for an colonoscopy to be performed for h/o colon polyp  Risks, benefits, limitations, and alternatives regarding  colonoscopy have been reviewed with the patient.  Questions have been answered.  All parties  agreeable.   Sherri Sear, MD  01/08/2019, 9:05 AM

## 2019-01-08 NOTE — Op Note (Signed)
[facility] Gastroenterology Patient Name: Matthew [name] Procedure Date: 01/08/2019 9:08 AM MRN: [id number] Account #: [id number] Date of Birth: 04-26-1936 Admit Type: Outpatient Age: 83 Room: [facility] ENDO ROOM 2 Gender: Male Note Status: Finalized Procedure:            Colonoscopy Indications:          High risk colon cancer surveillance: Personal history                        of colonic polyps, , Last colonoscopy: October 2019,                        cecal polyp not removed due to suboptimal prep in                        previous colonoscopy Providers:            Lin Landsman MD, MD Medicines:            Monitored Anesthesia Care Complications:        No immediate complications. Estimated blood loss:                        Minimal. Procedure:            Pre-Anesthesia Assessment:                       - Prior to the procedure, a History and Physical was                        performed, and patient medications and allergies were                        reviewed. The patient is competent. The risks and                        benefits of the procedure and the sedation options and                        risks were discussed with the patient. All questions                        were answered and informed consent was obtained.                        Patient identification and proposed procedure were                        verified by the physician, the nurse, the                        anesthesiologist, the anesthetist and the technician in                        the pre-procedure area in the procedure room in the                        endoscopy suite. Mental Status Examination: alert and  oriented. Airway Examination: normal oropharyngeal                        airway and neck mobility. Respiratory Examination:                        clear to auscultation. CV Examination: normal.                        Prophylactic Antibiotics: The  patient does not require                        prophylactic antibiotics. Prior Anticoagulants: The                        patient has taken no previous anticoagulant or                        antiplatelet agents. ASA Grade Assessment: III - A                        patient with severe systemic disease. After reviewing                        the risks and benefits, the patient was deemed in                        satisfactory condition to undergo the procedure. The                        anesthesia plan was to use monitored anesthesia care                        (MAC). Immediately prior to administration of                        medications, the patient was re-assessed for adequacy                        to receive sedatives. The heart rate, respiratory rate,                        oxygen saturations, blood pressure, adequacy of                        pulmonary ventilation, and response to care were                        monitored throughout the procedure. The physical status                        of the patient was re-assessed after the procedure.                       After obtaining informed consent, the colonoscope was                        passed under direct vision. Throughout the procedure,                        the patient's  blood pressure, pulse, and oxygen                        saturations were monitored continuously. The                        Colonoscope was introduced through the anus and                        advanced to the the cecum, identified by appendiceal                        orifice and ileocecal valve. The colonoscopy was                        performed without difficulty. The patient tolerated the                        procedure well. The quality of the bowel preparation                        was adequate. Findings:      The perianal and digital rectal examinations were normal. Pertinent       negatives include normal sphincter tone and no palpable rectal  lesions.      A 15 mm polyp was found in the cecum close to appendiceal orifice. The       polyp was carpet-like and flat. Preparations were made for mucosal       resection. Eleview was injected to raise the lesion, the polyp started       oozing, Area was successfully injected with 2 mL of a 1:10,000 solution       of epinephrine for hemostasis prior to polypectomy. The polyp was lifted       partially, Snare mucosal resection was performed. Resection was       incomplete. The resected tissue was retrieved.      Five sessile polyps were found in the ascending colon. The polyps were 4       to 6 mm in size. These polyps were removed with a hot snare. Resection       and retrieval were complete.      There were 2 <1cm flat polyps in [facility] on a fold, unable to remove despite       several attempts      Two sessile polyps were found in the transverse colon. The polyps were 8       mm in size. These polyps were removed with a hot snare. Resection and       retrieval were complete.      A 5 mm polyp was found in the transverse colon. The polyp was sessile.       The polyp was removed with a cold snare. Resection and retrieval were       complete.      Three sessile polyps were found in the descending colon. The polyps were       7 to 9 mm in size. These polyps were removed with a hot snare. Resection       and retrieval were complete.      A 9 mm polyp was found in the sigmoid colon. The polyp was sessile. The       polyp was removed with a  hot snare. Resection and retrieval were       complete.      The retroflexed view of the distal rectum and anal verge was normal and       showed no anal or rectal abnormalities.      Multiple diverticula were found in the sigmoid colon. Impression:           - One 15 mm polyp in the cecum, removed with mucosal                        resection. Incomplete resection. Resected tissue                        retrieved. Injected.                       - Five 4  to 6 mm polyps in the ascending colon, removed                        with a hot snare. Resected and retrieved.                       - Two 8 mm polyps in the transverse colon, removed with                        a hot snare. Resected and retrieved.                       - One 5 mm polyp in the transverse colon, removed with                        a cold snare. Resected and retrieved.                       - Three 7 to 9 mm polyps in the descending colon,                        removed with a hot snare. Resected and retrieved.                       - One 9 mm polyp in the sigmoid colon, removed with a                        hot snare. Resected and retrieved.                       - The distal rectum and anal verge are normal on                        retroflexion view.                       - Diverticulosis in the sigmoid colon.                       - Mucosal resection was performed. Resection was                        incomplete. The resected tissue was retrieved. Recommendation:       - Discharge patient to  home (with spouse).                       - Resume previous diet today.                       - Continue present medications.                       - Await pathology results.                       - Repeat colonoscopy in 3 years for surveillance of                        multiple polyps.                       - Refer to a general surgeon at appointment to be                        scheduled to evaluate for cecectomy due to incomplete                        removal of large flat polyp in cecum. Procedure Code(s):    --- Professional ---                       [phone], 59, Colonoscopy, flexible; with endoscopic                        mucosal resection                       [phone], Colonoscopy, flexible; with removal of tumor(s),                        polyp(s), or other lesion(s) by snare technique Diagnosis Code(s):    --- Professional ---                       Z86.010, Personal  history of colonic polyps                       D12.0, Benign neoplasm of cecum                       D12.3, Benign neoplasm of transverse colon (hepatic                        flexure or splenic flexure)                       D12.5, Benign neoplasm of sigmoid colon                       D12.2, Benign neoplasm of ascending colon                       D12.4, Benign neoplasm of descending colon                       K57.30, Diverticulosis of large intestine without  perforation or abscess without bleeding CPT copyright 2018 American Medical Association. All rights reserved. The codes documented in this report are preliminary and upon coder review may  be revised to meet current compliance requirements. Dr. Ulyess Mort Lin Landsman MD, MD 01/08/2019 10:23:38 AM This report has been signed electronically. Number of Addenda: 0 Note Initiated On: 01/08/2019 9:08 AM Scope Withdrawal Time: 0 hours 51 minutes 15 seconds  Total Procedure Duration: 0 hours 56 minutes 42 seconds       [facility]

## 2019-01-08 NOTE — Anesthesia Post-op Follow-up Note (Signed)
Anesthesia QCDR form completed.        

## 2019-01-08 NOTE — Anesthesia Postprocedure Evaluation (Signed)
Anesthesia Post Note  Patient: Matthew [name]  Procedure(s) Performed: COLONOSCOPY WITH PROPOFOL (N/A )  Patient location during evaluation: Endoscopy Anesthesia Type: General Level of consciousness: awake and alert Pain management: pain level controlled Vital Signs Assessment: post-procedure vital signs reviewed and stable Respiratory status: spontaneous breathing, nonlabored ventilation, respiratory function stable and patient connected to nasal cannula oxygen Cardiovascular status: blood pressure returned to baseline and stable Postop Assessment: no apparent nausea or vomiting Anesthetic complications: no     Last Vitals:  Vitals:   01/08/19 1043 01/08/19 1053  BP: 129/75 (!) 147/60  Pulse: (!) 57 60  Resp: 17 11  Temp:    SpO2: 94% 96%    Last Pain:  Vitals:   01/08/19 1053  TempSrc:   PainSc: 0-No pain                 Alvah Gilder S

## 2019-01-09 LAB — SURGICAL PATHOLOGY

## 2019-01-10 ENCOUNTER — Encounter: Payer: Self-pay | Admitting: Gastroenterology

## 2019-01-17 ENCOUNTER — Other Ambulatory Visit
Admission: RE | Admit: 2019-01-17 | Discharge: 2019-01-17 | Disposition: A | Discharge status: Home / Self Care | Payer: Medicare HMO | Source: Ambulatory Visit | Attending: Surgery | Admitting: Surgery

## 2019-01-17 ENCOUNTER — Encounter: Payer: Self-pay | Admitting: Surgery

## 2019-01-17 ENCOUNTER — Encounter: Payer: Self-pay | Admitting: *Deleted

## 2019-01-17 ENCOUNTER — Other Ambulatory Visit: Payer: Self-pay

## 2019-01-17 ENCOUNTER — Ambulatory Visit: Payer: Medicare HMO | Admitting: Surgery

## 2019-01-17 VITALS — BP 138/74 | HR 85 | Temp 97.7°F | Resp 18 | Ht 69.0 in | Wt 193.0 lb

## 2019-01-17 DIAGNOSIS — D120 Benign neoplasm of cecum: Secondary | ICD-10-CM | POA: Diagnosis present

## 2019-01-17 DIAGNOSIS — E538 Deficiency of other specified B group vitamins: Secondary | ICD-10-CM | POA: Insufficient documentation

## 2019-01-17 DIAGNOSIS — K635 Polyp of colon: Secondary | ICD-10-CM

## 2019-01-17 LAB — COMPREHENSIVE METABOLIC PANEL
ALBUMIN: 4.5 g/dL (ref 3.5–5.0)
ALT: 17 U/L (ref 0–44)
ANION GAP: 5 (ref 5–15)
AST: 17 U/L (ref 15–41)
Alkaline Phosphatase: 64 U/L (ref 38–126)
BUN: 14 mg/dL (ref 8–23)
CALCIUM: 9.4 mg/dL (ref 8.9–10.3)
CO2: 27 mmol/L (ref 22–32)
Chloride: 108 mmol/L (ref 98–111)
Creatinine, Ser: 0.9 mg/dL (ref 0.61–1.24)
GFR calc Af Amer: >60 mL/min (ref >60)
GFR calc non Af Amer: >60 mL/min (ref >60)
Glucose, Bld: 87 mg/dL (ref 70–99)
Potassium: 3.9 mmol/L (ref 3.5–5.1)
Sodium: 140 mmol/L (ref 135–145)
Total Bilirubin: 1.2 mg/dL (ref 0.3–1.2)
Total Protein: 6.9 g/dL (ref 6.5–8.1)

## 2019-01-17 LAB — CBC WITH DIFFERENTIAL/PLATELET
Abs Immature Granulocytes: 0.02 10*3/uL (ref 0.00–0.07)
BASOS PCT: 1 %
Basophils Absolute: 0.1 10*3/uL (ref 0.0–0.1)
Eosinophils Absolute: 0.1 10*3/uL (ref 0.0–0.5)
Eosinophils Relative: 2 %
HCT: 43.1 % (ref 39.0–52.0)
Hemoglobin: 13.4 g/dL (ref 13.0–17.0)
Immature Granulocytes: 0 %
Lymphocytes Relative: 25 %
Lymphs Abs: 1.4 10*3/uL (ref 0.7–4.0)
MCH: 26.7 pg (ref 26.0–34.0)
MCHC: 31.1 g/dL (ref 30.0–36.0)
MCV: 85.9 fL (ref 80.0–100.0)
Monocytes Absolute: 0.6 10*3/uL (ref 0.1–1.0)
Monocytes Relative: 10 %
Neutro Abs: 3.6 10*3/uL (ref 1.7–7.7)
Neutrophils Relative %: 62 %
PLATELETS: 107 10*3/uL — AB (ref 150–400)
RBC: 5.02 MIL/uL (ref 4.22–5.81)
RDW: 18.8 % — ABNORMAL HIGH (ref 11.5–15.5)
WBC: 5.8 10*3/uL (ref 4.0–10.5)
nRBC: 0 % (ref 0.0–0.2)

## 2019-01-17 LAB — VITAMIN B12: VITAMIN B 12: 606 pg/mL (ref 180–914)

## 2019-01-17 LAB — PROTIME-INR
INR: 1.01
Prothrombin Time: 13.2 seconds (ref 11.4–15.2)

## 2019-01-17 LAB — APTT: aPTT: 39 seconds — ABNORMAL HIGH (ref 24–36)

## 2019-01-17 NOTE — Patient Instructions (Signed)
Cardiac clearance needed and labs

## 2019-01-17 NOTE — Progress Notes (Signed)
Patient was sent to [facility] to have the following labs drawn today: CBC, INR, PTT, PT, and CMP.  The patient has been scheduled for a follow up appointment with Dr. Dahlia Byes for 01-24-19 at 3:45 pm.  A request for cardiac clearance has been forwarded to Dr. Tyrell Antonio office.

## 2019-01-18 ENCOUNTER — Telehealth: Payer: Self-pay | Admitting: *Deleted

## 2019-01-18 ENCOUNTER — Encounter: Payer: Self-pay | Admitting: Surgery

## 2019-01-18 ENCOUNTER — Telehealth: Payer: Self-pay | Admitting: Cardiovascular Disease

## 2019-01-18 NOTE — H&P (View-Only) (Signed)
Patient ID: Matthew [name], male   DOB: 09-14-1936, 83 y.o.   MRN: [id number]  HPI Matthew [name] is a 83 y.o. male seen in consultation at the request of Dr. Marius Ditch for an unresectable cecal polyp. ( I have personally reviewed images from colonoscopy)  Matthew [name] had a significant history of coronary artery disease status post CABG x2 and stent placement.  CHF with decrease ejection fraction . He  Does have some dyspnea on exertion but is able to be independent and able to carry logs of 50 pounds each. He was initially on both dual antiplatelet therapy but due to GI bleed and anemia this was stopped.  As part of this work-up endoscopy was performed and a colonoscopy showed evidence of multiple polyps 1 of them was 17 mm at the cecal area that was unable to be resected.  The final pathology showed tubular adenoma. Also had significant peripheral vascular disease and is status post iliac stents. He does have a history of thrombocytopenia and was platelets being around 100,000.  He denies any easy bruising no bleeding.  He denies any abdominal pain no weight loss.  He also had a history of severe aortic stenosis and is status post TAVR. He sees Dr Fletcher Anon and had a recent cath w/o any major vascular disease that is amenable to further intervention   HPI  Past Medical History:  Diagnosis Date  . Alcohol abuse    stopped drinking 42 years ago from today 2020  . Anemia   . AS (aortic stenosis)    a. Echo 6/10: EF 55% mild AS; b. echo 06/2015; EF 55-60%, GR1DD, moderate AS, Peak velocity (S): 346 cm/s. Mean gradientS): 28 mm Hg. Peak gradient (S): 48 mm Hg. Valve area (VTI): 1.18 cm2;   c. Echo 4/17 - mild LVH, EF 55-60%, no RWMA, Gr 1 DD, mod to severe AS (mean 28 mmHg, peak 46 mmHg), mild LAE  . CAD (coronary artery disease)    a. MI 1996 w/ CABG 1996; b.redo CABG 06/2003; c. Myoview 7/09: EF 54% inferobasal infarct, no ischemia. Myoview 6/10 EF 43% inf wall infarct. no ischemia; c. cath 8/16 s/p DES to  VG-OM2. OTW 3VD w/ patent VG->RPDA, VG->OM3,  & LIMA->LAD.  EF 55-65%.  . Carotid bruit    2009 0-39% on dopplers bilatrally  . Chest pain   . GERD (gastroesophageal reflux disease)   . History of kidney stones   . HOH (hard of hearing)   . HTN (hypertension)   . Hyperlipidemia   . Myocardial infarction ([place]) 1996  . Nephrolithiasis   . OSA (obstructive sleep apnea)   . PAD (peripheral artery disease) ([place]) 02/2014   Subtotal occlusion of right common iliac artery and 70% stenosis in the left common iliac artery. Status post bilateral kissing stent placement. 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 Shortness of breath    with exertion  . Thrombocytopenia ([place])     Past Surgical History:  Procedure Laterality Date  . ABDOMINAL AORTAGRAM N/A 03/06/2014   Procedure: ABDOMINAL Maxcine Ham;  Surgeon: Wellington Hampshire, MD;  Location: [place] CATH LAB;  Service: Cardiovascular;  Laterality: N/A;  . CARDIAC CATHETERIZATION  02/2014   Severe three-vessel coronary artery disease with patent grafts.   Marland Kitchen CARDIAC CATHETERIZATION N/A 07/31/2015   Procedure: Right/Left Heart Cath and Coronary/Graft Angiography;  Surgeon:  Wellington Hampshire, MD;  Location: [place] CV LAB;  Service: Cardiovascular;  Laterality: N/A;  . CARDIAC CATHETERIZATION N/A 07/31/2015   Procedure: Coronary Stent Intervention;  Surgeon: Wellington Hampshire, MD;  Location: [place] CV LAB;  Service: Cardiovascular;  Laterality: N/A;  . CARDIAC CATHETERIZATION N/A 05/26/2016   Procedure: Right/Left Heart Cath and Coronary Angiography;  Surgeon: Wellington Hampshire, MD;  Location: [place] CV LAB;  Service: Cardiovascular;  Laterality: N/A;  . COLONOSCOPY WITH PROPOFOL N/A 10/07/2018   Procedure: COLONOSCOPY WITH PROPOFOL;  Surgeon: Lin Landsman, MD;  Location: [facility]  ENDOSCOPY;  Service: Gastroenterology;  Laterality: N/A;  . COLONOSCOPY WITH PROPOFOL N/A 01/08/2019   Procedure: COLONOSCOPY WITH PROPOFOL;  Surgeon: Lin Landsman, MD;  Location: [facility] ENDOSCOPY;  Service: Gastroenterology;  Laterality: N/A;  . CORONARY ARTERY BYPASS GRAFT  1991   at [facility]. Redone 2004-3 vessels 1st time and 4 second time  . CYSTO     2/3 times for kidney stones  . ENTEROSCOPY N/A 12/25/2018   Procedure: ENTEROSCOPY Single balloon;  Surgeon: Lin Landsman, MD;  Location: [place];  Service: Gastroenterology;  Laterality: N/A;  . ESOPHAGOGASTRODUODENOSCOPY (EGD) WITH PROPOFOL N/A 10/07/2018   Procedure: ESOPHAGOGASTRODUODENOSCOPY (EGD) WITH PROPOFOL;  Surgeon: Lin Landsman, MD;  Location: [facility] ENDOSCOPY;  Service: Gastroenterology;  Laterality: N/A;  . GIVENS CAPSULE STUDY N/A 12/22/2018   Procedure: GIVENS CAPSULE STUDY;  Surgeon: Lin Landsman, MD;  Location: [facility] ENDOSCOPY;  Service: Gastroenterology;  Laterality: N/A;  . INGUINAL HERNIA REPAIR  11/10/2011   Procedure: HERNIA REPAIR INGUINAL ADULT;  Surgeon: Donato Heinz;  [facility];  Service: General;  Laterality: Right;  . LEFT HEART CATH AND CORONARY ANGIOGRAPHY Left 05/15/2018   Procedure: LEFT HEART CATH AND CORONARY ANGIOGRAPHY;  Surgeon: Wellington Hampshire, MD;  Location: [place] CV LAB;  Service: Cardiovascular;  Laterality: Left;  . LEFT HEART CATHETERIZATION WITH CORONARY/GRAFT ANGIOGRAM N/A 03/06/2014   Procedure: LEFT HEART CATHETERIZATION WITH Beatrix Fetters;  Surgeon: Wellington Hampshire, MD;  Location: [place] CATH LAB;  Service: Cardiovascular;  Laterality: N/A;  . STOMACH SURGERY     removal of gastric ulcers  . TEE WITHOUT CARDIOVERSION N/A 06/01/2016   Procedure: TRANSESOPHAGEAL ECHOCARDIOGRAM (TEE);  Surgeon: Sherren Mocha, MD;  Location: [place];  Service: Open Heart Surgery;  Laterality: N/A;  . TRANSCATHETER AORTIC VALVE REPLACEMENT, TRANSFEMORAL N/A 06/01/2016    Procedure: TRANSCATHETER AORTIC VALVE REPLACEMENT, TRANSFEMORAL;  Surgeon: Sherren Mocha, MD;  Location: [place];  Service: Open Heart Surgery;  Laterality: N/A;    Family History  Problem Relation Age of Onset  . Heart disease Father   . Heart disease Mother   . Colon cancer Neg Hx     Social History Social History   Tobacco Use  . Smoking status: Former Smoker    Packs/day: 1.00    Years: 50.00    Pack years: 50.00    Types: Cigarettes    Last attempt to quit: 01/24/1990    Years since quitting: 29.0  . Smokeless tobacco: Never Used  Substance Use Topics  . Alcohol use: No    Alcohol/week: 0.0 standard drinks    Comment: Alcoholic in past  . Drug use: No    Allergies  Allergen Reactions  . Ranexa [Ranolazine Er] Rash    Current Outpatient Medications  Medication Sig Dispense Refill  . acetaminophen (TYLENOL) 325 MG tablet Take 650 mg by mouth daily as needed for mild pain or headache.     Marland Kitchen  aspirin EC 81 MG tablet Take 1 tablet (81 mg total) by mouth daily. 90 tablet 3  . atorvastatin (LIPITOR) 80 MG tablet Take 1 tablet (80 mg total) by mouth daily. 90 tablet 1  . carvedilol (COREG) 3.125 MG tablet Take 1 tablet (3.125 mg total) by mouth 2 (two) times daily. 180 tablet 1  . GARLIC PO Take 1 capsule by mouth daily.     . Multiple Vitamins-Minerals (PRESERVISION AREDS PO) Take 1 tablet by mouth daily.     . nitroGLYCERIN (NITROSTAT) 0.4 MG SL tablet Place 0.4 mg under the tongue every 5 (five) minutes as needed for chest pain.    . Omega-3 Fatty Acids (FISH OIL PO) Take 1 capsule by mouth daily.     . pantoprazole (PROTONIX) 40 MG tablet Take 1 tablet (40 mg total) by mouth daily. 90 tablet 3  . sacubitril-valsartan (ENTRESTO) 24-26 MG Take 1 tablet by mouth 2 (two) times daily. 180 tablet 3  . sacubitril-valsartan (ENTRESTO) 24-26 MG Take 1 tablet by mouth 2 (two) times daily. 60 tablet 0  . tamsulosin (FLOMAX) 0.4 MG CAPS capsule Take 1 capsule (0.4 mg total) by mouth  daily. 90 capsule 3  . traMADol (ULTRAM) 50 MG tablet Take 1 tablet (50 mg total) daily by mouth. (Patient taking differently: Take 50 mg by mouth daily as needed. ) 90 tablet 0  . TURMERIC PO Take 1 capsule by mouth at bedtime.      No current facility-administered medications for this visit.      Review of Systems Full ROS  was asked and was negative except for the information on the HPI  Physical Exam Blood pressure 138/74, pulse 85, temperature 97.7 F (36.5 C), temperature source Temporal, resp. rate 18, height 5\' 9"  (1.753 m), weight 193 lb (87.5 kg), SpO2 93 %. CONSTITUTIONAL: NAD EYES: Pupils are equal, round, and reactive to light, Sclera are non-icteric. EARS, NOSE, MOUTH AND THROAT: The oropharynx is clear. The oral mucosa is pink and moist. Hearing is intact to voice. LYMPH NODES:  Lymph nodes in the neck are normal. RESPIRATORY:  Lungs are clear. There is normal respiratory effort, with equal breath sounds bilaterally, and without pathologic use of accessory muscles. CARDIOVASCULAR: Heart is regular without murmurs, gallops, or rubs. GI: The abdomen is soft, nontender, and nondistended. There are no palpable masses. There is no hepatosplenomegaly. There are normal bowel sounds in all quadrants. GU: Rectal deferred.   MUSCULOSKELETAL: Normal muscle strength and tone. No cyanosis or edema.   SKIN: Turgor is good and there are no pathologic skin lesions or ulcers. NEUROLOGIC: Motor and sensation is grossly normal. Cranial nerves are grossly intact. PSYCH:  Oriented to person, place and time. Affect is normal.  Data Reviewed  I have personally reviewed the patient's imaging, laboratory findings and medical records.    Assessment/Plan 83 year old male with multiple comorbidities now presents with anemia and an unresectable cecal polyp.  Had an extensive discussion with the patient and the family regarding his situation.  I do think that he will be a candidate for  cystectomy/right colectomy but we will need to optimize him.  We will ask cardiology for optimization and evaluation. Also repeat labs to include coags, CBC CMP.  We will see him back next week to make sure everything is taking care of.  I discussed with him and he is a good candidate for a laparoscopic procedure.  Discussed with patient detail.  Risk benefit and possible complications including but not limited  to: Bleeding, infection, anastomotic leak and perioperative complications. Copy of this report was to the referring provider     Caroleen Hamman, MD FACS General Surgeon 01/18/2019, 8:57 AM

## 2019-01-18 NOTE — Telephone Encounter (Signed)
Notified patient daughter as instructed, patient daughter pleased. Discussed follow-up appointments, patient daughter agrees

## 2019-01-18 NOTE — Telephone Encounter (Signed)
° °  [facility] Pre-operative Risk Assessment    Request for surgical clearance:  1. What type of surgery is being performed? unresectable colonic polyp  2. When is this surgery scheduled? TBD   3. What type of clearance is required (medical clearance vs. Pharmacy clearance to hold med vs. Both)? cardiac  4. Are there any medications that need to be held prior to surgery and how long? no   5. Practice name and name of physician performing surgery? [facility], Dr Dahlia Byes  6. What is your office phone number [phone]    7.   What is your office fax number [phone]  8.   Anesthesia type (None, local, MAC, general) ? Probably general    Ace Gins 01/18/2019, 11:15 AM  _________________________________________________________________   (provider comments below)

## 2019-01-18 NOTE — Telephone Encounter (Signed)
    [facility] Pre-operative Risk Assessment    Request for surgical clearance:  1. What type of surgery is being performed? NOT LISTED  2. When is this surgery scheduled? TBA  3. What type of clearance is required (medical clearance vs. Pharmacy clearance to hold med vs. Both)? MEDICAL  4. Are there any medications that need to be held prior to surgery and how long? NOT LISTED   5. Practice name and name of physician performing surgery? [facility]  6. What is your office phone number [phone]   7.   What is your office fax number [phone]  8.   Anesthesia type (None, local, MAC, general) ? NOT LISTED   Matthew [name] 01/18/2019, 11:47 AM  _________________________________________________________________   (provider comments below)

## 2019-01-18 NOTE — Telephone Encounter (Signed)
-----   Message from Jules Husbands, MD sent at 01/18/2019  8:51 AM EST ----- Please let him know that his labs are normal except Platelets that are low 100k He still needs cards clearance ----- Message ----- From: Buel Ream, Lab In [place] Sent: 01/17/2019   5:16 PM EST To: Jules Husbands, MD

## 2019-01-18 NOTE — Progress Notes (Signed)
Patient ID: Matthew [name], male   DOB: [birth year], 83 y.o.   MRN: [id number]  HPI Matthew [name] is a 83 y.o. male seen in consultation at the request of Dr. Marius Ditch for an unresectable cecal polyp. ( I have personally reviewed images from colonoscopy)  Matthew [name] had a significant history of coronary artery disease status post CABG x2 and stent placement.  CHF with decrease ejection fraction . He  Does have some dyspnea on exertion but is able to be independent and able to carry logs of 50 pounds each. He was initially on both dual antiplatelet therapy but due to GI bleed and anemia this was stopped.  As part of this work-up endoscopy was performed and a colonoscopy showed evidence of multiple polyps 1 of them was 17 mm at the cecal area that was unable to be resected.  The final pathology showed tubular adenoma. Also had significant peripheral vascular disease and is status post iliac stents. He does have a history of thrombocytopenia and was platelets being around 100,000.  He denies any easy bruising no bleeding.  He denies any abdominal pain no weight loss.  He also had a history of severe aortic stenosis and is status post TAVR. He sees Dr Fletcher Anon and had a recent cath w/o any major vascular disease that is amenable to further intervention   HPI  Past Medical History:  Diagnosis Date  . Alcohol abuse    stopped drinking 42 years ago from today 2020  . Anemia   . AS (aortic stenosis)    a. Echo 6/10: EF 55% mild AS; b. echo 06/2015; EF 55-60%, GR1DD, moderate AS, Peak velocity (S): 346 cm/s. Mean gradientS): 28 mm Hg. Peak gradient (S): 48 mm Hg. Valve area (VTI): 1.18 cm2;   c. Echo 4/17 - mild LVH, EF 55-60%, no RWMA, Gr 1 DD, mod to severe AS (mean 28 mmHg, peak 46 mmHg), mild LAE  . CAD (coronary artery disease)    a. MI 1996 w/ CABG 1996; b.redo CABG 06/2003; c. Myoview 7/09: EF 54% inferobasal infarct, no ischemia. Myoview 6/10 EF 43% inf wall infarct. no ischemia; c. cath 8/16 s/p DES to  VG-OM2. OTW 3VD w/ patent VG->RPDA, VG->OM3,  & LIMA->LAD.  EF 55-65%.  . Carotid bruit    2009 0-39% on dopplers bilatrally  . Chest pain   . GERD (gastroesophageal reflux disease)   . History of kidney stones   . HOH (hard of hearing)   . HTN (hypertension)   . Hyperlipidemia   . Myocardial infarction ([place]) 1996  . Nephrolithiasis   . OSA (obstructive sleep apnea)   . PAD (peripheral artery disease) ([place]) 02/2014   Subtotal occlusion of right common iliac artery and 70% stenosis in the left common iliac artery. Status post bilateral kissing stent placement. 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 Shortness of breath    with exertion  . Thrombocytopenia ([place])     Past Surgical History:  Procedure Laterality Date  . ABDOMINAL AORTAGRAM N/A 03/06/2014   Procedure: ABDOMINAL Maxcine Ham;  Surgeon: Wellington Hampshire, MD;  Location: [place] CATH LAB;  Service: Cardiovascular;  Laterality: N/A;  . CARDIAC CATHETERIZATION  02/2014   Severe three-vessel coronary artery disease with patent grafts.   Marland Kitchen CARDIAC CATHETERIZATION N/A 07/31/2015   Procedure: Right/Left Heart Cath and Coronary/Graft Angiography;  Surgeon:  Wellington Hampshire, MD;  Location: [place] CV LAB;  Service: Cardiovascular;  Laterality: N/A;  . CARDIAC CATHETERIZATION N/A 07/31/2015   Procedure: Coronary Stent Intervention;  Surgeon: Wellington Hampshire, MD;  Location: [place] CV LAB;  Service: Cardiovascular;  Laterality: N/A;  . CARDIAC CATHETERIZATION N/A 05/26/2016   Procedure: Right/Left Heart Cath and Coronary Angiography;  Surgeon: Wellington Hampshire, MD;  Location: [place] CV LAB;  Service: Cardiovascular;  Laterality: N/A;  . COLONOSCOPY WITH PROPOFOL N/A 10/07/2018   Procedure: COLONOSCOPY WITH PROPOFOL;  Surgeon: Lin Landsman, MD;  Location: [facility]  ENDOSCOPY;  Service: Gastroenterology;  Laterality: N/A;  . COLONOSCOPY WITH PROPOFOL N/A 01/08/2019   Procedure: COLONOSCOPY WITH PROPOFOL;  Surgeon: Lin Landsman, MD;  Location: [facility] ENDOSCOPY;  Service: Gastroenterology;  Laterality: N/A;  . CORONARY ARTERY BYPASS GRAFT  1991   at [facility]. Redone 2004-3 vessels 1st time and 4 second time  . CYSTO     2/3 times for kidney stones  . ENTEROSCOPY N/A 12/25/2018   Procedure: ENTEROSCOPY Single balloon;  Surgeon: Lin Landsman, MD;  Location: [place];  Service: Gastroenterology;  Laterality: N/A;  . ESOPHAGOGASTRODUODENOSCOPY (EGD) WITH PROPOFOL N/A 10/07/2018   Procedure: ESOPHAGOGASTRODUODENOSCOPY (EGD) WITH PROPOFOL;  Surgeon: Lin Landsman, MD;  Location: [facility] ENDOSCOPY;  Service: Gastroenterology;  Laterality: N/A;  . GIVENS CAPSULE STUDY N/A 12/22/2018   Procedure: GIVENS CAPSULE STUDY;  Surgeon: Lin Landsman, MD;  Location: [facility] ENDOSCOPY;  Service: Gastroenterology;  Laterality: N/A;  . INGUINAL HERNIA REPAIR  11/10/2011   Procedure: HERNIA REPAIR INGUINAL ADULT;  Surgeon: Donato Heinz;  [facility];  Service: General;  Laterality: Right;  . LEFT HEART CATH AND CORONARY ANGIOGRAPHY Left 05/15/2018   Procedure: LEFT HEART CATH AND CORONARY ANGIOGRAPHY;  Surgeon: Wellington Hampshire, MD;  Location: [place] CV LAB;  Service: Cardiovascular;  Laterality: Left;  . LEFT HEART CATHETERIZATION WITH CORONARY/GRAFT ANGIOGRAM N/A 03/06/2014   Procedure: LEFT HEART CATHETERIZATION WITH Beatrix Fetters;  Surgeon: Wellington Hampshire, MD;  Location: [place] CATH LAB;  Service: Cardiovascular;  Laterality: N/A;  . STOMACH SURGERY     removal of gastric ulcers  . TEE WITHOUT CARDIOVERSION N/A 06/01/2016   Procedure: TRANSESOPHAGEAL ECHOCARDIOGRAM (TEE);  Surgeon: Sherren Mocha, MD;  Location: [place];  Service: Open Heart Surgery;  Laterality: N/A;  . TRANSCATHETER AORTIC VALVE REPLACEMENT, TRANSFEMORAL N/A 06/01/2016    Procedure: TRANSCATHETER AORTIC VALVE REPLACEMENT, TRANSFEMORAL;  Surgeon: Sherren Mocha, MD;  Location: [place];  Service: Open Heart Surgery;  Laterality: N/A;    Family History  Problem Relation Age of Onset  . Heart disease Father   . Heart disease Mother   . Colon cancer Neg Hx     Social History Social History   Tobacco Use  . Smoking status: Former Smoker    Packs/day: 1.00    Years: 50.00    Pack years: 50.00    Types: Cigarettes    Last attempt to quit: 01/24/1990    Years since quitting: 29.0  . Smokeless tobacco: Never Used  Substance Use Topics  . Alcohol use: No    Alcohol/week: 0.0 standard drinks    Comment: Alcoholic in past  . Drug use: No    Allergies  Allergen Reactions  . Ranexa [Ranolazine Er] Rash    Current Outpatient Medications  Medication Sig Dispense Refill  . acetaminophen (TYLENOL) 325 MG tablet Take 650 mg by mouth daily as needed for mild pain or headache.     Marland Kitchen  aspirin EC 81 MG tablet Take 1 tablet (81 mg total) by mouth daily. 90 tablet 3  . atorvastatin (LIPITOR) 80 MG tablet Take 1 tablet (80 mg total) by mouth daily. 90 tablet 1  . carvedilol (COREG) 3.125 MG tablet Take 1 tablet (3.125 mg total) by mouth 2 (two) times daily. 180 tablet 1  . GARLIC PO Take 1 capsule by mouth daily.     . Multiple Vitamins-Minerals (PRESERVISION AREDS PO) Take 1 tablet by mouth daily.     . nitroGLYCERIN (NITROSTAT) 0.4 MG SL tablet Place 0.4 mg under the tongue every 5 (five) minutes as needed for chest pain.    . Omega-3 Fatty Acids (FISH OIL PO) Take 1 capsule by mouth daily.     . pantoprazole (PROTONIX) 40 MG tablet Take 1 tablet (40 mg total) by mouth daily. 90 tablet 3  . sacubitril-valsartan (ENTRESTO) 24-26 MG Take 1 tablet by mouth 2 (two) times daily. 180 tablet 3  . sacubitril-valsartan (ENTRESTO) 24-26 MG Take 1 tablet by mouth 2 (two) times daily. 60 tablet 0  . tamsulosin (FLOMAX) 0.4 MG CAPS capsule Take 1 capsule (0.4 mg total) by mouth  daily. 90 capsule 3  . traMADol (ULTRAM) 50 MG tablet Take 1 tablet (50 mg total) daily by mouth. (Patient taking differently: Take 50 mg by mouth daily as needed. ) 90 tablet 0  . TURMERIC PO Take 1 capsule by mouth at bedtime.      No current facility-administered medications for this visit.      Review of Systems Full ROS  was asked and was negative except for the information on the HPI  Physical Exam Blood pressure 138/74, pulse 85, temperature 97.7 F (36.5 C), temperature source Temporal, resp. rate 18, height 5\' 9"  (1.753 m), weight 193 lb (87.5 kg), SpO2 93 %. CONSTITUTIONAL: NAD EYES: Pupils are equal, round, and reactive to light, Sclera are non-icteric. EARS, NOSE, MOUTH AND THROAT: The oropharynx is clear. The oral mucosa is pink and moist. Hearing is intact to voice. LYMPH NODES:  Lymph nodes in the neck are normal. RESPIRATORY:  Lungs are clear. There is normal respiratory effort, with equal breath sounds bilaterally, and without pathologic use of accessory muscles. CARDIOVASCULAR: Heart is regular without murmurs, gallops, or rubs. GI: The abdomen is soft, nontender, and nondistended. There are no palpable masses. There is no hepatosplenomegaly. There are normal bowel sounds in all quadrants. GU: Rectal deferred.   MUSCULOSKELETAL: Normal muscle strength and tone. No cyanosis or edema.   SKIN: Turgor is good and there are no pathologic skin lesions or ulcers. NEUROLOGIC: Motor and sensation is grossly normal. Cranial nerves are grossly intact. PSYCH:  Oriented to person, place and time. Affect is normal.  Data Reviewed  I have personally reviewed the patient's imaging, laboratory findings and medical records.    Assessment/Plan 83 year old male with multiple comorbidities now presents with anemia and an unresectable cecal polyp.  Had an extensive discussion with the patient and the family regarding his situation.  I do think that he will be a candidate for  cystectomy/right colectomy but we will need to optimize him.  We will ask cardiology for optimization and evaluation. Also repeat labs to include coags, CBC CMP.  We will see him back next week to make sure everything is taking care of.  I discussed with him and he is a good candidate for a laparoscopic procedure.  Discussed with patient detail.  Risk benefit and possible complications including but not limited  to: Bleeding, infection, anastomotic leak and perioperative complications. Copy of this report was to the referring provider     Caroleen Hamman, MD FACS General Surgeon 01/18/2019, 8:57 AM

## 2019-01-22 ENCOUNTER — Ambulatory Visit: Payer: Self-pay | Admitting: Gastroenterology

## 2019-01-22 NOTE — Telephone Encounter (Signed)
   Primary Cardiologist: [name], MD  Chart reviewed as part of pre-operative protocol coverage. Given past medical history and time since last visit, based on ACC/AHA guidelines, ORVIL FARAONE would be at acceptable risk for the planned procedure without further cardiovascular testing. Per Dr. Fletcher Anon, he is a "overall moderate risk given his cardiac comorbidities"  I will route this recommendation to the requesting party via [facility] fax function and remove from pre-op pool.  Please call with questions.  [place], [name] 01/22/2019, 3:07 PM

## 2019-01-22 NOTE — Telephone Encounter (Signed)
He can proceed at an overall moderate risk given his cardiac comorbidities.  No need for cardiac testing before surgery.  He does not have to wait until EP evaluation. Thanks.

## 2019-01-22 NOTE — Telephone Encounter (Signed)
Dr. Fletcher Anon. Based on Dr. Corlis Leak office note on 2/5, it appears he is considering laproscopic surgery for the patient's unresectable cecal polyps. Dr. Dahlia Byes wants evaluation and optimization of cardiac condition by cardiology. I see you referred the patient to oncology, he has a visit with Dr. Caryl Comes on 02/08/2019. Do you want EP eval prior to clearance?

## 2019-01-24 ENCOUNTER — Other Ambulatory Visit: Payer: Self-pay | Admitting: Physician Assistant

## 2019-01-24 ENCOUNTER — Ambulatory Visit (INDEPENDENT_AMBULATORY_CARE_PROVIDER_SITE_OTHER): Payer: Medicare HMO | Admitting: Surgery

## 2019-01-24 ENCOUNTER — Telehealth: Payer: Self-pay | Admitting: *Deleted

## 2019-01-24 ENCOUNTER — Encounter: Payer: Self-pay | Admitting: Oncology

## 2019-01-24 ENCOUNTER — Encounter: Payer: Self-pay | Admitting: *Deleted

## 2019-01-24 ENCOUNTER — Encounter: Payer: Self-pay | Admitting: Surgery

## 2019-01-24 ENCOUNTER — Other Ambulatory Visit: Payer: Self-pay

## 2019-01-24 VITALS — BP 174/77 | HR 53 | Temp 97.7°F | Resp 20 | Ht 69.0 in | Wt 193.0 lb

## 2019-01-24 DIAGNOSIS — D120 Benign neoplasm of cecum: Secondary | ICD-10-CM | POA: Diagnosis not present

## 2019-01-24 DIAGNOSIS — K635 Polyp of colon: Secondary | ICD-10-CM

## 2019-01-24 MED ORDER — NEOMYCIN SULFATE 500 MG PO TABS: ORAL_TABLET | ORAL | 0 refills | Status: DC

## 2019-01-24 MED ORDER — ERYTHROMYCIN 500 MG PO TBEC: 500.0000 mg | DELAYED_RELEASE_TABLET | ORAL | 0 refills | Status: DC

## 2019-01-24 MED ORDER — POLYETHYLENE GLYCOL 3350 17 GM/SCOOP PO POWD: ORAL | 0 refills | Status: DC

## 2019-01-24 MED ORDER — BISACODYL 5 MG PO TBEC: DELAYED_RELEASE_TABLET | ORAL | 0 refills | Status: DC

## 2019-01-24 NOTE — Telephone Encounter (Signed)
Spoke with patient's daughter, [name], and notified her that we did receive cardiac clearance from Dr. Fletcher Anon.   See note in Epic from 01-22-19 from [place], [name] and Dr. Nile Riggs risk given cardiac comorbidities.   Patient to see Dr. Dahlia Byes today, 01-24-19 at 3:45 pm. Daughter verbalizes understanding.

## 2019-01-24 NOTE — Patient Instructions (Addendum)
The patient is aware to call back for any questions or new concerns. Schedule right colon surgery Dr Fletcher Anon has cleared for surgery Dr Caryl Comes appointment is 02-08-19

## 2019-01-24 NOTE — Progress Notes (Signed)
Patient's surgery to be scheduled for 02-09-19 at [facility] with Dr. Dahlia Byes. Dr. Rosana Hoes will be assisting with this case (secure chat message sent to Dr. Rosana Hoes about assisting-ok per Dr. Rosana Hoes). *Dr. Dahlia Byes requested that surgery be on a Thursday or Friday prior to weekend on call.   Patient will need to complete a formal bowel prep. Instructions were reviewed and provided to the patient today. Prescriptions sent in electronically to [facility].   The patient is aware he will need to Pre-Admit. Patient will check in at the [place], [address] (first floor). Patient is requesting we contact daughter, [name] (phone: [phone]), with appointment date/time once [facility] arranges.  The patient is aware to call the office should he have further questions.

## 2019-01-25 ENCOUNTER — Telehealth: Payer: Self-pay | Admitting: *Deleted

## 2019-01-25 NOTE — Telephone Encounter (Signed)
Patient's daughter, [name], contacted today and surgery date of 02-09-19 confirmed.  Daughter is also aware that patient will need to pre-admit on 02-02-19 at 1 pm. She verbalizes understanding.

## 2019-01-26 ENCOUNTER — Encounter: Payer: Self-pay | Admitting: Surgery

## 2019-01-26 NOTE — Progress Notes (Signed)
Outpatient Surgical Follow Up  01/26/2019  Matthew [name] is an 83 y.o. male.   Chief Complaint  Patient presents with  . Follow-up    cecal polyp, discuss surgery    HPI: Matthew [name] had a significant history of coronary artery disease status post CABG x2 and stent placement.  CHF with decrease ejection fraction . He  Does have some dyspnea on exertion but is able to be independent and able to carry logs of 50 pounds each. He was initially on both dual antiplatelet therapy but due to GI bleed and anemia this was stopped.  As part of this work-up endoscopy was performed and a colonoscopy showed evidence of multiple polyps 1 of them was 17 mm at the cecal area that was unable to be resected.  The final pathology showed tubular adenoma. Also had significant peripheral vascular disease and is status post iliac stents. He does have a history of thrombocytopenia and was platelets being around 100,000.  He denies any easy bruising no bleeding.  He denies any abdominal pain no weight loss.  He also had a history of severe aortic stenosis and is status post TAVR. F/U unresectable polyp cecum. Dr. Fletcher [name] has done a preop eval and he is ok w proceeding to the OR.   Past Medical History:  Diagnosis Date  . Alcohol abuse    stopped drinking 42 years ago from today 2020  . Anemia   . AS (aortic stenosis)    a. Echo 6/10: EF 55% mild AS; b. echo 06/2015; EF 55-60%, GR1DD, moderate AS, Peak velocity (S): 346 cm/s. Mean gradientS): 28 mm Hg. Peak gradient (S): 48 mm Hg. Valve area (VTI): 1.18 cm2;   c. Echo 4/17 - mild LVH, EF 55-60%, no RWMA, Gr 1 DD, mod to severe AS (mean 28 mmHg, peak 46 mmHg), mild LAE  . CAD (coronary artery disease)    a. MI 1996 w/ CABG 1996; b.redo CABG 06/2003; c. Myoview 7/09: EF 54% inferobasal infarct, no ischemia. Myoview 6/10 EF 43% inf wall infarct. no ischemia; c. cath 8/16 s/p DES to VG-OM2. OTW 3VD w/ patent VG->RPDA, VG->OM3,  & LIMA->LAD.  EF 55-65%.  . Carotid bruit    2009 0-39% on dopplers bilatrally  . Chest pain   . GERD (gastroesophageal reflux disease)   . History of kidney stones   . HOH (hard of hearing)   . HTN (hypertension)   . Hyperlipidemia   . Myocardial infarction (Matthew [name]) 1996  . Nephrolithiasis   . OSA (obstructive sleep apnea)   . PAD (peripheral artery disease) ([place]) 02/2014   Subtotal occlusion of right common iliac artery and 70% stenosis in the left common iliac artery. Status post bilateral kissing stent placement. 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 Shortness of breath    with exertion  . Thrombocytopenia (Matthew [name])     Past Surgical History:  Procedure Laterality Date  . ABDOMINAL AORTAGRAM N/A 03/06/2014   Procedure: ABDOMINAL Matthew [name];  Surgeon: Matthew Hampshire, MD;  Location: [facility] CATH LAB;  Service: Cardiovascular;  Laterality: N/A;  . CARDIAC CATHETERIZATION  02/2014   Severe three-vessel coronary artery disease with patent grafts.   Marland Kitchen CARDIAC CATHETERIZATION N/A 07/31/2015   Procedure: Right/Left Heart Cath and Coronary/Graft Angiography;  Surgeon: Matthew Hampshire, MD;  Location: [place] CV LAB;  Service: Cardiovascular;  Laterality:  N/A;  . CARDIAC CATHETERIZATION N/A 07/31/2015   Procedure: Coronary Stent Intervention;  Surgeon: Matthew Hampshire, MD;  Location: [place] CV LAB;  Service: Cardiovascular;  Laterality: N/A;  . CARDIAC CATHETERIZATION N/A 05/26/2016   Procedure: Right/Left Heart Cath and Coronary Angiography;  Surgeon: Matthew Hampshire, MD;  Location: [place] CV LAB;  Service: Cardiovascular;  Laterality: N/A;  . COLONOSCOPY WITH PROPOFOL N/A 10/07/2018   Procedure: COLONOSCOPY WITH PROPOFOL;  Surgeon: Matthew Landsman, MD;  Location: [facility] ENDOSCOPY;  Service: Gastroenterology;  Laterality: N/A;  . COLONOSCOPY WITH PROPOFOL N/A  01/08/2019   Procedure: COLONOSCOPY WITH PROPOFOL;  Surgeon: Matthew Landsman, MD;  Location: [facility] ENDOSCOPY;  Service: Gastroenterology;  Laterality: N/A;  . CORONARY ARTERY BYPASS GRAFT  1991   at [facility]. Redone 2004-3 vessels 1st time and 4 second time  . CYSTO     2/3 times for kidney stones  . ENTEROSCOPY N/A 12/25/2018   Procedure: ENTEROSCOPY Single balloon;  Surgeon: Matthew Landsman, MD;  Location: [place];  Service: Gastroenterology;  Laterality: N/A;  . ESOPHAGOGASTRODUODENOSCOPY (EGD) WITH PROPOFOL N/A 10/07/2018   Procedure: ESOPHAGOGASTRODUODENOSCOPY (EGD) WITH PROPOFOL;  Surgeon: Matthew Landsman, MD;  Location: [facility] ENDOSCOPY;  Service: Gastroenterology;  Laterality: N/A;  . GIVENS CAPSULE STUDY N/A 12/22/2018   Procedure: GIVENS CAPSULE STUDY;  Surgeon: Matthew Landsman, MD;  Location: [facility] ENDOSCOPY;  Service: Gastroenterology;  Laterality: N/A;  . INGUINAL HERNIA REPAIR  11/10/2011   Procedure: HERNIA REPAIR INGUINAL ADULT;  Surgeon: Donato Heinz;  Location: [facility];  Service: General;  Laterality: Right;  . LEFT HEART CATH AND CORONARY ANGIOGRAPHY Left 05/15/2018   Procedure: LEFT HEART CATH AND CORONARY ANGIOGRAPHY;  Surgeon: Matthew Hampshire, MD;  Location: [place] CV LAB;  Service: Cardiovascular;  Laterality: Left;  . LEFT HEART CATHETERIZATION WITH CORONARY/GRAFT ANGIOGRAM N/A 03/06/2014   Procedure: LEFT HEART CATHETERIZATION WITH Beatrix Fetters;  Surgeon: Matthew Hampshire, MD;  Location: [place] CATH LAB;  Service: Cardiovascular;  Laterality: N/A;  . STOMACH SURGERY     removal of gastric ulcers  . TEE WITHOUT CARDIOVERSION N/A 06/01/2016   Procedure: TRANSESOPHAGEAL ECHOCARDIOGRAM (TEE);  Surgeon: Sherren Mocha, MD;  Location: [place];  Service: Open Heart Surgery;  Laterality: N/A;  . TRANSCATHETER AORTIC VALVE REPLACEMENT, TRANSFEMORAL N/A 06/01/2016   Procedure: TRANSCATHETER AORTIC VALVE REPLACEMENT, TRANSFEMORAL;  Surgeon: Sherren Mocha,  MD;  Location: [place];  Service: Open Heart Surgery;  Laterality: N/A;    Family History  Problem Relation Age of Onset  . Heart disease Father   . Heart disease Mother   . Colon cancer Neg Hx     Social History:  reports that he quit smoking about 29 years ago. His smoking use included cigarettes. He has a 50.00 pack-year smoking history. He has never used smokeless tobacco. He reports that he does not drink alcohol or use drugs.  Allergies:  Allergies  Allergen Reactions  . Ranexa [Ranolazine Er] Rash    Medications reviewed.    ROS Full ROS performed and is otherwise negative other than what is stated in HPI   BP (!) 174/77   Pulse (!) 53   Temp 97.7 F (36.5 C) (Temporal)   Resp 20   Ht 5\' 9"  (1.753 m)   Wt 193 lb (87.5 kg)   SpO2 94%   BMI 28.50 kg/m   Physical Exam Vitals signs and nursing note reviewed. Exam conducted with a chaperone present.  Constitutional:  General: He is not in acute distress.    Appearance: Normal appearance.  Neck:     Musculoskeletal: Normal range of motion and neck supple. No muscular tenderness.  Cardiovascular:     Rate and Rhythm: Normal rate and regular rhythm.     Heart sounds: No murmur. No friction rub.  Pulmonary:     Effort: Pulmonary effort is normal. No respiratory distress.     Breath sounds: Normal breath sounds. No stridor.  Abdominal:     General: Abdomen is flat. There is no distension.     Palpations: There is no mass.     Tenderness: There is no abdominal tenderness. There is no guarding.     Hernia: No hernia is present.  Skin:    General: Skin is warm and dry.     Capillary Refill: Capillary refill takes less than 2 seconds.     Coloration: Skin is not jaundiced.  Neurological:     General: No focal deficit present.     Mental Status: He is alert and oriented to person, place, and time.  Psychiatric:        Mood and Affect: Mood normal.        Behavior: Behavior normal.        Thought Content:  Thought content normal.        Judgment: Judgment normal.        Assessment/Plan: 83 year old male with unresectable cecal polyp.  Discussed with the patient in detail about options and he wishes to proceed with a laparoscopic right hemicolectomy.  Procedure discussed with the patient detail.  Risk, benefits and possible complications including but not limited to: Bleeding, infection, interventions, anastomotic leak.  He understands and wishes to proceed. Given his anemia and thrombocytopenia we will do preop labs. Greater than 50% of the 40 minutes  visit was spent in counseling/coordination of care   Caroleen Hamman, MD [place] Surgeon

## 2019-01-31 ENCOUNTER — Encounter: Payer: Self-pay | Admitting: Oncology

## 2019-02-01 ENCOUNTER — Other Ambulatory Visit: Payer: Self-pay | Admitting: *Deleted

## 2019-02-01 DIAGNOSIS — D500 Iron deficiency anemia secondary to blood loss (chronic): Secondary | ICD-10-CM

## 2019-02-02 ENCOUNTER — Encounter
Admission: RE | Admit: 2019-02-02 | Discharge: 2019-02-02 | Disposition: A | Discharge status: Home / Self Care | Payer: Medicare HMO | Source: Ambulatory Visit | Attending: Surgery | Admitting: Surgery

## 2019-02-02 ENCOUNTER — Other Ambulatory Visit: Payer: Self-pay

## 2019-02-02 DIAGNOSIS — Z01812 Encounter for preprocedural laboratory examination: Secondary | ICD-10-CM | POA: Diagnosis present

## 2019-02-02 NOTE — Patient Instructions (Addendum)
  Your procedure is scheduled on: Friday February 09, 2019 Report to Same Day Surgery 2nd floor [facility] Entrance-take elevator on left to 2nd floor.  Check in with surgery information desk.) To find out your arrival time, call [phone] 1:00-3:00 PM on Thursday February 08, 2019  Remember: Instructions that are not followed completely may result in serious medical risk, up to and including death, or upon the discretion of your surgeon and anesthesiologist your surgery may need to be rescheduled.    __x__ 1. Do not eat food (including mints, candies, chewing gum) after midnight the night before your procedure. You may drink water up to 2 hours before you are scheduled to arrive at the hospital for your procedure.  Do not drink anything within 2 hours of your scheduled arrival to the hospital.   __x__ 2. No Alcohol for 24 hours before or after surgery.   __x__ 3. No Smoking or e-cigarettes for 24 hours before surgery.  Do not use any chewable tobacco products for at least 6 hours before surgery.   __x__ 4. Notify your doctor if there is any change in your medical condition (cold, fever, infections).   __x__ 5. On the morning of surgery clean your mouth as you normally do.  You may rinse your mouth with mouthwash if you wish.  Do not swallow any mouthwash.  Please read over the following fact sheets that you were given:   [facility] Preparing for Surgery and/or MRSA Information    __x__ Use CHG Soap or Sage wipes as directed on instruction sheet.   Do not wear jewelry, lotions, powders, deodorant or perfumes on the day of surgery.   Do not shave 48 hours prior to surgery.   Do not bring valuables to the hospital.    [facility] is not responsible for any belongings or valuables.               Contacts, dentures or bridgework may not be worn into surgery.  Leave your suitcase in the car. After surgery it may be brought to your room.  For patients admitted to the  hospital, discharge time is determined by your treatment team.    __x__ Take these medicines on the morning of surgery with A SMALL SIP OF WATER:  1. Carvedilol/Coreg  2. Pantoprazole/Protonix  3. Tamsulosin/Flomax  4. Atorvastatin/Lipitor   Skip your Entresto on the morning of surgery  __x__ Complete your bowel prep as directed by your surgeon.   __x__ Bring C-Pap/Bi-Pap machine to the hospital.   __x__ Follow recommendations from your surgeon, Cardiologist, Pulmonologist or PCP regarding stopping any blood thinners before surgery such as Aspirin, Coumadin, Plavix, Eliquis, Effient, Pradaxa, and Pletal.  __x__ TODAY: Stop Anti-inflammatories such as Advil, Ibuprofen, Motrin, Aleve, Naproxen, Naprosyn, BC/Goodies powders or aspirin products. You may continue to take Tylenol and Celebrex.   __x__ TODAY: Stop supplements (Garlic, Fish Oil, Turmeric) until after surgery. You may continue to take your multivitamin.

## 2019-02-04 ENCOUNTER — Encounter: Payer: Self-pay | Admitting: *Deleted

## 2019-02-05 ENCOUNTER — Inpatient Hospital Stay: Payer: Medicare HMO | Attending: Oncology

## 2019-02-05 ENCOUNTER — Other Ambulatory Visit: Payer: Self-pay | Admitting: *Deleted

## 2019-02-05 ENCOUNTER — Telehealth: Payer: Self-pay | Admitting: *Deleted

## 2019-02-05 DIAGNOSIS — D509 Iron deficiency anemia, unspecified: Secondary | ICD-10-CM | POA: Insufficient documentation

## 2019-02-05 DIAGNOSIS — D500 Iron deficiency anemia secondary to blood loss (chronic): Secondary | ICD-10-CM

## 2019-02-05 DIAGNOSIS — E538 Deficiency of other specified B group vitamins: Secondary | ICD-10-CM | POA: Insufficient documentation

## 2019-02-05 LAB — CBC WITH DIFFERENTIAL/PLATELET
ABS IMMATURE GRANULOCYTES: 0.03 10*3/uL (ref 0.00–0.07)
BASOS PCT: 1 %
Basophils Absolute: 0.1 10*3/uL (ref 0.0–0.1)
Eosinophils Absolute: 0.1 10*3/uL (ref 0.0–0.5)
Eosinophils Relative: 2 %
HCT: 42.2 % (ref 39.0–52.0)
Hemoglobin: 13.5 g/dL (ref 13.0–17.0)
Immature Granulocytes: 1 %
Lymphocytes Relative: 25 %
Lymphs Abs: 1.6 10*3/uL (ref 0.7–4.0)
MCH: 27.6 pg (ref 26.0–34.0)
MCHC: 32 g/dL (ref 30.0–36.0)
MCV: 86.1 fL (ref 80.0–100.0)
MONOS PCT: 9 %
Monocytes Absolute: 0.6 10*3/uL (ref 0.1–1.0)
Neutro Abs: 3.9 10*3/uL (ref 1.7–7.7)
Neutrophils Relative %: 62 %
Platelets: 98 10*3/uL — ABNORMAL LOW (ref 150–400)
RBC: 4.9 MIL/uL (ref 4.22–5.81)
RDW: 19.3 % — ABNORMAL HIGH (ref 11.5–15.5)
WBC: 6.3 10*3/uL (ref 4.0–10.5)
nRBC: 0 % (ref 0.0–0.2)

## 2019-02-05 LAB — IRON AND TIBC
Iron: 60 ug/dL (ref 45–182)
SATURATION RATIOS: 19 % (ref 17.9–39.5)
TIBC: 311 ug/dL (ref 250–450)
UIBC: 251 ug/dL

## 2019-02-05 LAB — FERRITIN: Ferritin: 42 ng/mL (ref 24–336)

## 2019-02-05 NOTE — Telephone Encounter (Signed)
Patients daughter called and stated that she went to pick up the medication for surgery on Saturday and she stated that the pharmcy did not have it. Its neomycin and erythromycin. They use [facility] in [place]

## 2019-02-05 NOTE — Telephone Encounter (Signed)
Message for daughter, [name], letting her know that I did receive her message.   I did call patient's pharmacy to see why they did not receive electronic prescription that was sent on 01-29-19. They could not give me a reason.   I did speak with the pharmacist at [facility] in [place] and gave a verbal order for neomycin, erythromycin, dulcolax, and Miralax.   Message left that daughter did not need to return my call unless they had further questions.

## 2019-02-08 ENCOUNTER — Encounter

## 2019-02-08 ENCOUNTER — Ambulatory Visit: Payer: Self-pay | Admitting: Internal Medicine

## 2019-02-09 ENCOUNTER — Inpatient Hospital Stay: Payer: Medicare HMO | Admitting: Certified Registered"

## 2019-02-09 ENCOUNTER — Encounter
Admission: RE | Disposition: A | Discharge status: Home / Self Care | Payer: Self-pay | Source: Home / Self Care | Attending: Surgery

## 2019-02-09 ENCOUNTER — Other Ambulatory Visit: Payer: Self-pay

## 2019-02-09 ENCOUNTER — Inpatient Hospital Stay
Admission: RE | Admit: 2019-02-09 | Discharge: 2019-02-15 | DRG: 329 | Disposition: A | Discharge status: Home / Self Care | Payer: Medicare HMO | Attending: Surgery | Admitting: Surgery

## 2019-02-09 DIAGNOSIS — Z952 Presence of prosthetic heart valve: Secondary | ICD-10-CM | POA: Diagnosis not present

## 2019-02-09 DIAGNOSIS — K635 Polyp of colon: Secondary | ICD-10-CM | POA: Diagnosis present

## 2019-02-09 DIAGNOSIS — E785 Hyperlipidemia, unspecified: Secondary | ICD-10-CM | POA: Diagnosis present

## 2019-02-09 DIAGNOSIS — R079 Chest pain, unspecified: Secondary | ICD-10-CM

## 2019-02-09 DIAGNOSIS — Z955 Presence of coronary angioplasty implant and graft: Secondary | ICD-10-CM

## 2019-02-09 DIAGNOSIS — Z7982 Long term (current) use of aspirin: Secondary | ICD-10-CM | POA: Diagnosis not present

## 2019-02-09 DIAGNOSIS — I110 Hypertensive heart disease with heart failure: Secondary | ICD-10-CM | POA: Diagnosis present

## 2019-02-09 DIAGNOSIS — Z951 Presence of aortocoronary bypass graft: Secondary | ICD-10-CM | POA: Diagnosis not present

## 2019-02-09 DIAGNOSIS — Z87891 Personal history of nicotine dependence: Secondary | ICD-10-CM | POA: Diagnosis not present

## 2019-02-09 DIAGNOSIS — Z8249 Family history of ischemic heart disease and other diseases of the circulatory system: Secondary | ICD-10-CM | POA: Diagnosis not present

## 2019-02-09 DIAGNOSIS — K921 Melena: Secondary | ICD-10-CM | POA: Diagnosis not present

## 2019-02-09 DIAGNOSIS — D120 Benign neoplasm of cecum: Secondary | ICD-10-CM | POA: Diagnosis not present

## 2019-02-09 DIAGNOSIS — K219 Gastro-esophageal reflux disease without esophagitis: Secondary | ICD-10-CM | POA: Diagnosis present

## 2019-02-09 DIAGNOSIS — I447 Left bundle-branch block, unspecified: Secondary | ICD-10-CM | POA: Diagnosis present

## 2019-02-09 DIAGNOSIS — K429 Umbilical hernia without obstruction or gangrene: Secondary | ICD-10-CM | POA: Diagnosis present

## 2019-02-09 DIAGNOSIS — I361 Nonrheumatic tricuspid (valve) insufficiency: Secondary | ICD-10-CM | POA: Diagnosis not present

## 2019-02-09 DIAGNOSIS — D696 Thrombocytopenia, unspecified: Secondary | ICD-10-CM | POA: Diagnosis not present

## 2019-02-09 DIAGNOSIS — G4733 Obstructive sleep apnea (adult) (pediatric): Secondary | ICD-10-CM | POA: Diagnosis present

## 2019-02-09 DIAGNOSIS — Z9049 Acquired absence of other specified parts of digestive tract: Secondary | ICD-10-CM | POA: Diagnosis not present

## 2019-02-09 DIAGNOSIS — K6389 Other specified diseases of intestine: Secondary | ICD-10-CM | POA: Diagnosis present

## 2019-02-09 DIAGNOSIS — Z95820 Peripheral vascular angioplasty status with implants and grafts: Secondary | ICD-10-CM

## 2019-02-09 DIAGNOSIS — I255 Ischemic cardiomyopathy: Secondary | ICD-10-CM | POA: Diagnosis present

## 2019-02-09 DIAGNOSIS — I739 Peripheral vascular disease, unspecified: Secondary | ICD-10-CM | POA: Diagnosis present

## 2019-02-09 DIAGNOSIS — I5023 Acute on chronic systolic (congestive) heart failure: Secondary | ICD-10-CM | POA: Diagnosis not present

## 2019-02-09 DIAGNOSIS — D62 Acute posthemorrhagic anemia: Secondary | ICD-10-CM | POA: Diagnosis not present

## 2019-02-09 DIAGNOSIS — I252 Old myocardial infarction: Secondary | ICD-10-CM | POA: Diagnosis not present

## 2019-02-09 DIAGNOSIS — E538 Deficiency of other specified B group vitamins: Secondary | ICD-10-CM | POA: Diagnosis present

## 2019-02-09 DIAGNOSIS — I248 Other forms of acute ischemic heart disease: Secondary | ICD-10-CM | POA: Diagnosis not present

## 2019-02-09 DIAGNOSIS — I340 Nonrheumatic mitral (valve) insufficiency: Secondary | ICD-10-CM | POA: Diagnosis not present

## 2019-02-09 DIAGNOSIS — D509 Iron deficiency anemia, unspecified: Secondary | ICD-10-CM | POA: Diagnosis present

## 2019-02-09 DIAGNOSIS — I25118 Atherosclerotic heart disease of native coronary artery with other forms of angina pectoris: Secondary | ICD-10-CM | POA: Diagnosis present

## 2019-02-09 DIAGNOSIS — R7989 Other specified abnormal findings of blood chemistry: Secondary | ICD-10-CM | POA: Diagnosis not present

## 2019-02-09 DIAGNOSIS — I5022 Chronic systolic (congestive) heart failure: Secondary | ICD-10-CM | POA: Diagnosis not present

## 2019-02-09 HISTORY — PX: LAPAROSCOPIC RIGHT COLECTOMY: SHX5925

## 2019-02-09 LAB — CBC
HCT: 41.7 % (ref 39.0–52.0)
Hemoglobin: 13.3 g/dL (ref 13.0–17.0)
MCH: 27.8 pg (ref 26.0–34.0)
MCHC: 31.9 g/dL (ref 30.0–36.0)
MCV: 87.1 fL (ref 80.0–100.0)
Platelets: 93 10*3/uL — ABNORMAL LOW (ref 150–400)
RBC: 4.79 MIL/uL (ref 4.22–5.81)
RDW: 18.8 % — ABNORMAL HIGH (ref 11.5–15.5)
WBC: 11.1 10*3/uL — ABNORMAL HIGH (ref 4.0–10.5)
nRBC: 0 % (ref 0.0–0.2)

## 2019-02-09 LAB — CREATININE, SERUM
Creatinine, Ser: 0.87 mg/dL (ref 0.61–1.24)
GFR calc Af Amer: >60 mL/min (ref >60)
GFR calc non Af Amer: >60 mL/min (ref >60)

## 2019-02-09 SURGERY — COLECTOMY, RIGHT, LAPAROSCOPIC
Anesthesia: General | Laterality: Right

## 2019-02-09 MED ORDER — OXYCODONE HCL 5 MG PO TABS: 5.0000 mg | ORAL_TABLET | ORAL | Status: DC | PRN

## 2019-02-09 MED ORDER — NITROGLYCERIN 0.4 MG SL SUBL: 0.4000 mg | SUBLINGUAL_TABLET | SUBLINGUAL | Status: DC | PRN

## 2019-02-09 MED ORDER — ACETAMINOPHEN 10 MG/ML IV SOLN
INTRAVENOUS | Status: DC | PRN
  Administered 2019-02-09: 1000 mg via INTRAVENOUS

## 2019-02-09 MED ORDER — VASOPRESSIN 20 UNIT/ML IV SOLN
INTRAVENOUS | Status: DC | PRN
  Administered 2019-02-09 (×3): 1 [IU] via INTRAVENOUS

## 2019-02-09 MED ORDER — EPHEDRINE SULFATE 50 MG/ML IJ SOLN
INTRAMUSCULAR | Status: DC | PRN
  Administered 2019-02-09 (×2): 10 mg via INTRAVENOUS
  Administered 2019-02-09 (×5): 5 mg via INTRAVENOUS

## 2019-02-09 MED ORDER — ACETAMINOPHEN 500 MG PO TABS
1000.0000 mg | ORAL_TABLET | Freq: Four times a day (QID) | ORAL | Status: DC
  Administered 2019-02-09 – 2019-02-15 (×23): 1000 mg via ORAL
  Filled 2019-02-09 (×24): qty 2

## 2019-02-09 MED ORDER — ONDANSETRON HCL 4 MG/2ML IJ SOLN
INTRAMUSCULAR | Status: DC | PRN
  Administered 2019-02-09: 4 mg via INTRAVENOUS

## 2019-02-09 MED ORDER — CHLORHEXIDINE GLUCONATE CLOTH 2 % EX PADS: 6.0000 | MEDICATED_PAD | Freq: Once | CUTANEOUS | Status: DC

## 2019-02-09 MED ORDER — ALBUMIN HUMAN 5 % IV SOLN
INTRAVENOUS | Status: AC
  Filled 2019-02-09: qty 250

## 2019-02-09 MED ORDER — LIDOCAINE HCL (PF) 2 % IJ SOLN
INTRAMUSCULAR | Status: AC
  Filled 2019-02-09: qty 10

## 2019-02-09 MED ORDER — VASOPRESSIN 20 UNIT/ML IV SOLN
INTRAVENOUS | Status: AC
  Filled 2019-02-09: qty 1

## 2019-02-09 MED ORDER — SODIUM CHLORIDE 0.9 % IV SOLN
INTRAVENOUS | Status: DC | PRN
  Administered 2019-02-09: 70 mL

## 2019-02-09 MED ORDER — MORPHINE SULFATE (PF) 2 MG/ML IV SOLN: 2.0000 mg | INTRAVENOUS | Status: DC | PRN

## 2019-02-09 MED ORDER — SODIUM CHLORIDE 0.9 % IV SOLN
1.0000 g | INTRAVENOUS | Status: AC
  Administered 2019-02-09: 1 g via INTRAVENOUS
  Filled 2019-02-09: qty 1

## 2019-02-09 MED ORDER — DEXTROSE IN LACTATED RINGERS 5 % IV SOLN
INTRAVENOUS | Status: DC
  Administered 2019-02-09 – 2019-02-12 (×4): via INTRAVENOUS

## 2019-02-09 MED ORDER — FENTANYL CITRATE (PF) 100 MCG/2ML IJ SOLN
INTRAMUSCULAR | Status: AC
  Filled 2019-02-09: qty 2

## 2019-02-09 MED ORDER — SUGAMMADEX SODIUM 200 MG/2ML IV SOLN
INTRAVENOUS | Status: DC | PRN
  Administered 2019-02-09: 360 mg via INTRAVENOUS

## 2019-02-09 MED ORDER — CARVEDILOL 6.25 MG PO TABS
3.1250 mg | ORAL_TABLET | Freq: Two times a day (BID) | ORAL | Status: DC
  Administered 2019-02-09 – 2019-02-15 (×12): 3.125 mg via ORAL
  Filled 2019-02-09 (×12): qty 1

## 2019-02-09 MED ORDER — HYDRALAZINE HCL 20 MG/ML IJ SOLN: 10.0000 mg | INTRAMUSCULAR | Status: DC | PRN

## 2019-02-09 MED ORDER — SUCCINYLCHOLINE CHLORIDE 20 MG/ML IJ SOLN
INTRAMUSCULAR | Status: AC
  Filled 2019-02-09: qty 1

## 2019-02-09 MED ORDER — LACTATED RINGERS IV SOLN
INTRAVENOUS | Status: DC | PRN
  Administered 2019-02-09: 10:00:00 via INTRAVENOUS

## 2019-02-09 MED ORDER — ONDANSETRON HCL 4 MG/2ML IJ SOLN
INTRAMUSCULAR | Status: AC
  Filled 2019-02-09: qty 2

## 2019-02-09 MED ORDER — PROCHLORPERAZINE MALEATE 10 MG PO TABS
10.0000 mg | ORAL_TABLET | Freq: Four times a day (QID) | ORAL | Status: DC | PRN
  Filled 2019-02-09: qty 1

## 2019-02-09 MED ORDER — HEPARIN SODIUM (PORCINE) 5000 UNIT/ML IJ SOLN: 5000.0000 [IU] | Freq: Three times a day (TID) | INTRAMUSCULAR | Status: DC

## 2019-02-09 MED ORDER — ACETAMINOPHEN 10 MG/ML IV SOLN
INTRAVENOUS | Status: AC
  Filled 2019-02-09: qty 100

## 2019-02-09 MED ORDER — BUPIVACAINE HCL (PF) 0.5 % IJ SOLN
INTRAMUSCULAR | Status: AC
  Filled 2019-02-09: qty 30

## 2019-02-09 MED ORDER — BUPIVACAINE LIPOSOME 1.3 % IJ SUSP
INTRAMUSCULAR | Status: AC
  Filled 2019-02-09: qty 20

## 2019-02-09 MED ORDER — DEXAMETHASONE SODIUM PHOSPHATE 10 MG/ML IJ SOLN
INTRAMUSCULAR | Status: AC
  Filled 2019-02-09: qty 1

## 2019-02-09 MED ORDER — LIDOCAINE HCL (CARDIAC) PF 100 MG/5ML IV SOSY
PREFILLED_SYRINGE | INTRAVENOUS | Status: DC | PRN
  Administered 2019-02-09: 100 mg via INTRAVENOUS

## 2019-02-09 MED ORDER — ALBUMIN HUMAN 5 % IV SOLN
INTRAVENOUS | Status: DC | PRN
  Administered 2019-02-09 (×2): via INTRAVENOUS

## 2019-02-09 MED ORDER — PHENYLEPHRINE HCL 10 MG/ML IJ SOLN
INTRAMUSCULAR | Status: DC | PRN
  Administered 2019-02-09 (×3): 100 ug via INTRAVENOUS

## 2019-02-09 MED ORDER — SUGAMMADEX SODIUM 200 MG/2ML IV SOLN
INTRAVENOUS | Status: AC
  Filled 2019-02-09: qty 4

## 2019-02-09 MED ORDER — OXYCODONE HCL 5 MG/5ML PO SOLN: 5.0000 mg | Freq: Once | ORAL | Status: DC | PRN

## 2019-02-09 MED ORDER — ONDANSETRON HCL 4 MG/2ML IJ SOLN: 4.0000 mg | Freq: Four times a day (QID) | INTRAMUSCULAR | Status: DC | PRN

## 2019-02-09 MED ORDER — ROCURONIUM BROMIDE 100 MG/10ML IV SOLN
INTRAVENOUS | Status: DC | PRN
  Administered 2019-02-09: 50 mg via INTRAVENOUS
  Administered 2019-02-09 (×2): 20 mg via INTRAVENOUS

## 2019-02-09 MED ORDER — ROCURONIUM BROMIDE 50 MG/5ML IV SOLN
INTRAVENOUS | Status: AC
  Filled 2019-02-09: qty 2

## 2019-02-09 MED ORDER — PROPOFOL 10 MG/ML IV BOLUS
INTRAVENOUS | Status: DC | PRN
  Administered 2019-02-09: 120 mg via INTRAVENOUS
  Administered 2019-02-09: 20 mg via INTRAVENOUS

## 2019-02-09 MED ORDER — BUPIVACAINE-EPINEPHRINE (PF) 0.5% -1:200000 IJ SOLN
INTRAMUSCULAR | Status: DC | PRN
  Administered 2019-02-09: 30 mL

## 2019-02-09 MED ORDER — EPHEDRINE SULFATE 50 MG/ML IJ SOLN
INTRAMUSCULAR | Status: AC
  Filled 2019-02-09: qty 1

## 2019-02-09 MED ORDER — DEXMEDETOMIDINE HCL 200 MCG/2ML IV SOLN
INTRAVENOUS | Status: DC | PRN
  Administered 2019-02-09 (×3): 4 ug via INTRAVENOUS

## 2019-02-09 MED ORDER — PROPOFOL 10 MG/ML IV BOLUS
INTRAVENOUS | Status: AC
  Filled 2019-02-09: qty 20

## 2019-02-09 MED ORDER — SODIUM CHLORIDE FLUSH 0.9 % IV SOLN
INTRAVENOUS | Status: AC
  Filled 2019-02-09: qty 40

## 2019-02-09 MED ORDER — ONDANSETRON 4 MG PO TBDP: 4.0000 mg | ORAL_TABLET | Freq: Four times a day (QID) | ORAL | Status: DC | PRN

## 2019-02-09 MED ORDER — TAMSULOSIN HCL 0.4 MG PO CAPS
0.4000 mg | ORAL_CAPSULE | Freq: Every day | ORAL | Status: DC
  Administered 2019-02-10 – 2019-02-15 (×6): 0.4 mg via ORAL
  Filled 2019-02-09 (×6): qty 1

## 2019-02-09 MED ORDER — FENTANYL CITRATE (PF) 100 MCG/2ML IJ SOLN
INTRAMUSCULAR | Status: DC | PRN
  Administered 2019-02-09 (×6): 50 ug via INTRAVENOUS

## 2019-02-09 MED ORDER — OXYCODONE HCL 5 MG PO TABS: 5.0000 mg | ORAL_TABLET | Freq: Once | ORAL | Status: DC | PRN

## 2019-02-09 MED ORDER — FENTANYL CITRATE (PF) 100 MCG/2ML IJ SOLN: 25.0000 ug | INTRAMUSCULAR | Status: DC | PRN

## 2019-02-09 MED ORDER — SODIUM CHLORIDE 0.9 % IV SOLN
INTRAVENOUS | Status: DC | PRN
  Administered 2019-02-09: 20 ug/min via INTRAVENOUS

## 2019-02-09 MED ORDER — SUCCINYLCHOLINE CHLORIDE 20 MG/ML IJ SOLN
INTRAMUSCULAR | Status: DC | PRN
  Administered 2019-02-09: 120 mg via INTRAVENOUS

## 2019-02-09 MED ORDER — LACTATED RINGERS IV SOLN
INTRAVENOUS | Status: DC
  Administered 2019-02-09 (×3): via INTRAVENOUS

## 2019-02-09 MED ORDER — PROCHLORPERAZINE EDISYLATE 10 MG/2ML IJ SOLN
5.0000 mg | Freq: Four times a day (QID) | INTRAMUSCULAR | Status: DC | PRN
  Filled 2019-02-09: qty 2

## 2019-02-09 MED ORDER — PANTOPRAZOLE SODIUM 40 MG IV SOLR
40.0000 mg | Freq: Every day | INTRAVENOUS | Status: DC
  Administered 2019-02-09 – 2019-02-12 (×4): 40 mg via INTRAVENOUS
  Filled 2019-02-09 (×4): qty 40

## 2019-02-09 MED ORDER — EPINEPHRINE PF 1 MG/ML IJ SOLN
INTRAMUSCULAR | Status: AC
  Filled 2019-02-09: qty 1

## 2019-02-09 MED ORDER — DEXAMETHASONE SODIUM PHOSPHATE 10 MG/ML IJ SOLN
INTRAMUSCULAR | Status: DC | PRN
  Administered 2019-02-09: 10 mg via INTRAVENOUS

## 2019-02-09 SURGICAL SUPPLY — 75 items
ADH SKN CLS APL DERMABOND .7 (GAUZE/BANDAGES/DRESSINGS) ×2
BLADE SURG SZ10 CARB STEEL (BLADE) ×3 IMPLANT
BLADE SURG SZ11 CARB STEEL (BLADE) ×2 IMPLANT
BOOT SUTURE AID YELLOW STND (SUTURE) ×2 IMPLANT
CANISTER SUCT 1200ML W/VALVE (MISCELLANEOUS) ×3 IMPLANT
CNTNR SPEC 2.5X3XGRAD LEK (MISCELLANEOUS) ×1
CONT SPEC 4OZ STER OR WHT (MISCELLANEOUS) ×2
CONT SPEC 4OZ STRL OR WHT (MISCELLANEOUS) ×1
CONTAINER SPEC 2.5X3XGRAD LEK (MISCELLANEOUS) IMPLANT
COVER WAND RF STERILE (DRAPES) ×1 IMPLANT
DECANTER SPIKE VIAL GLASS SM (MISCELLANEOUS) ×3 IMPLANT
DEFOGGER SCOPE WARMER CLEARIFY (MISCELLANEOUS) ×3 IMPLANT
DERMABOND ADVANCED (GAUZE/BANDAGES/DRESSINGS) ×4
DERMABOND ADVANCED .7 DNX12 (GAUZE/BANDAGES/DRESSINGS) ×2 IMPLANT
DRAPE INCISE IOBAN 66X45 STRL (DRAPES) ×3 IMPLANT
DRSG OPSITE POSTOP 3X4 (GAUZE/BANDAGES/DRESSINGS) ×4 IMPLANT
DRSG OPSITE POSTOP 4X8 (GAUZE/BANDAGES/DRESSINGS) ×2 IMPLANT
ELECT BLADE 6.5 EXT (BLADE) ×2 IMPLANT
ELECT CAUTERY BLADE 6.4 (BLADE) ×5 IMPLANT
ELECT REM PT RETURN 9FT ADLT (ELECTROSURGICAL) ×3
ELECTRODE REM PT RTRN 9FT ADLT (ELECTROSURGICAL) ×1 IMPLANT
GAUZE 4X4 16PLY RFD (DISPOSABLE) ×2 IMPLANT
GLOVE BIO SURGEON STRL SZ7 (GLOVE) ×31 IMPLANT
GOWN STRL REUS W/ TWL LRG LVL3 (GOWN DISPOSABLE) ×4 IMPLANT
GOWN STRL REUS W/TWL LRG LVL3 (GOWN DISPOSABLE) ×24
HANDLE SUCTION POOLE (INSTRUMENTS) ×1 IMPLANT
HANDLE YANKAUER SUCT BULB TIP (MISCELLANEOUS) ×5 IMPLANT
HOLDER FOLEY CATH W/STRAP (MISCELLANEOUS) ×3 IMPLANT
NEEDLE HYPO 22GX1.5 SAFETY (NEEDLE) ×3 IMPLANT
NS IRRIG 1000ML POUR BTL (IV SOLUTION) ×3 IMPLANT
PACK COLON CLEAN CLOSURE (MISCELLANEOUS) ×3 IMPLANT
PACK LAP CHOLECYSTECTOMY (MISCELLANEOUS) ×3 IMPLANT
PENCIL ELECTRO HAND CTR (MISCELLANEOUS) ×3 IMPLANT
RELOAD PROXIMATE 75MM BLUE (ENDOMECHANICALS) ×18 IMPLANT
RELOAD STAPLE 60 2.6 WHT THN (STAPLE) ×2 IMPLANT
RELOAD STAPLE 60 3.6 BLU REG (STAPLE) ×2 IMPLANT
RELOAD STAPLE 75 3.8 BLU REG (ENDOMECHANICALS) IMPLANT
RELOAD STAPLER BLUE 60MM (STAPLE) ×2 IMPLANT
RELOAD STAPLER WHITE 60MM (STAPLE) ×2 IMPLANT
RETRACTOR WOUND ALXS 18CM MED (MISCELLANEOUS) IMPLANT
RTRCTR WOUND ALEXIS O 18CM MED (MISCELLANEOUS)
SHEARS HARMONIC ACE PLUS 36CM (ENDOMECHANICALS) ×3 IMPLANT
SLEEVE ENDOPATH XCEL 5M (ENDOMECHANICALS) ×2 IMPLANT
SPONGE LAP 18X18 RF (DISPOSABLE) ×6 IMPLANT
SPONGE LAP 18X36 RFD (DISPOSABLE) ×3 IMPLANT
STAPLE ECHEON FLEX 60 POW ENDO (STAPLE) ×1 IMPLANT
STAPLER PROXIMATE 75MM BLUE (STAPLE) ×2 IMPLANT
STAPLER RELOAD BLUE 60MM (STAPLE) ×6
STAPLER RELOAD WHITE 60MM (STAPLE) ×6
STAPLER SKIN PROX 35W (STAPLE) ×4 IMPLANT
SUCTION POOLE HANDLE (INSTRUMENTS) ×3
SUT MNCRL 4-0 (SUTURE) ×3
SUT MNCRL 4-0 27XMFL (SUTURE) ×1
SUT PDS AB 0 CT1 27 (SUTURE) ×6 IMPLANT
SUT PROLENE 5 0 RB 1 DA (SUTURE) ×2 IMPLANT
SUT SILK 2 0 (SUTURE) ×6
SUT SILK 2 0 SH CR/8 (SUTURE) ×3 IMPLANT
SUT SILK 2-0 30XBRD TIE 12 (SUTURE) ×1 IMPLANT
SUT SILK 3-0 (SUTURE) ×2 IMPLANT
SUT VIC AB 3-0 SH 27 (SUTURE) ×3
SUT VIC AB 3-0 SH 27X BRD (SUTURE) ×1 IMPLANT
SUT VICRYL 0 AB UR-6 (SUTURE) ×6 IMPLANT
SUTURE MNCRL 4-0 27XMF (SUTURE) ×2 IMPLANT
SYR 30ML LL (SYRINGE) ×1 IMPLANT
SYR BULB IRRIG 60ML STRL (SYRINGE) ×2 IMPLANT
SYS LAPSCP GELPORT 120MM (MISCELLANEOUS) ×3
SYSTEM LAPSCP GELPORT 120MM (MISCELLANEOUS) ×1 IMPLANT
TOWEL OR 17X26 4PK STRL BLUE (TOWEL DISPOSABLE) ×3 IMPLANT
TRAY FOLEY MTR SLVR 16FR STAT (SET/KITS/TRAYS/PACK) ×3 IMPLANT
TROCAR XCEL 12X100 BLDLESS (ENDOMECHANICALS) ×1 IMPLANT
TROCAR XCEL BLUNT TIP 100MML (ENDOMECHANICALS) ×3 IMPLANT
TROCAR XCEL NON-BLD 5MMX100MML (ENDOMECHANICALS) ×3 IMPLANT
TUBING CONNECTING 10 (TUBING) ×1 IMPLANT
TUBING CONNECTING 10' (TUBING) ×1
TUBING EVAC SMOKE HEATED PNEUM (TUBING) ×3 IMPLANT

## 2019-02-09 NOTE — Transfer of Care (Signed)
Immediate Anesthesia Transfer of Care Note  Patient: Matthew [name]  Procedure(s) Performed: LAPAROSCOPIC RIGHT COLECTOMY (Right )  Patient Location: PACU  Anesthesia Type:General  Level of Consciousness: awake and drowsy  Airway & Oxygen Therapy: Patient Spontanous Breathing and Patient connected to face mask oxygen  Post-op Assessment: Report given to RN and Post -op Vital signs reviewed and stable  Post vital signs: stable  Last Vitals:  Vitals Value Taken Time  BP 125/47 02/09/2019  1:45 PM  Temp    Pulse 69 02/09/2019  1:50 PM  Resp 21 02/09/2019  1:50 PM  SpO2 98 % 02/09/2019  1:50 PM  Vitals shown include unvalidated device data.  Last Pain:  Vitals:   02/09/19 0838  TempSrc: Tympanic  PainSc: 0-No pain         Complications: No apparent anesthesia complications

## 2019-02-09 NOTE — Anesthesia Post-op Follow-up Note (Signed)
Anesthesia QCDR form completed.        

## 2019-02-09 NOTE — Anesthesia Procedure Notes (Signed)
Procedure Name: Intubation Date/Time: 02/09/2019 9:47 AM Performed by: Lavone Orn, CRNA Pre-anesthesia Checklist: Patient identified, Emergency Drugs available, Suction available, Patient being monitored and Timeout performed Patient Re-evaluated:Patient Re-evaluated prior to induction Oxygen Delivery Method: Circle system utilized Preoxygenation: Pre-oxygenation with 100% oxygen Induction Type: IV induction Ventilation: Mask ventilation without difficulty and Oral airway inserted - appropriate to patient size Laryngoscope Size: Mac and 4 Grade View: Grade I Tube type: Oral Tube size: 7.5 mm Number of attempts: 1 Airway Equipment and Method: Stylet Placement Confirmation: ETT inserted through vocal cords under direct vision,  positive ETCO2 and breath sounds checked- equal and bilateral Secured at: 22 cm Tube secured with: Tape Dental Injury: Teeth and Oropharynx as per pre-operative assessment

## 2019-02-09 NOTE — Anesthesia Preprocedure Evaluation (Signed)
Anesthesia Evaluation  Patient identified by MRN, date of birth, ID band Patient awake    Reviewed: Allergy & Precautions, H&P , NPO status , Patient's Chart, lab work & pertinent test results  History of Anesthesia Complications Negative for: history of anesthetic complications  Airway Mallampati: II  TM Distance: >3 FB Neck ROM: full    Dental  (+) Poor Dentition, Missing, Lower Dentures, Upper Dentures, Edentulous Lower, Edentulous Upper   Pulmonary neg shortness of breath, sleep apnea , former smoker,           Cardiovascular Exercise Tolerance: Good hypertension, (-) angina+ CAD, + Past MI and + CABG       Neuro/Psych PSYCHIATRIC DISORDERS  Neuromuscular disease    GI/Hepatic Neg liver ROS, GERD  Medicated and Controlled,  Endo/Other  negative endocrine ROS  Renal/GU Renal disease     Musculoskeletal  (+) Arthritis ,   Abdominal   Peds  Hematology negative hematology ROS (+)   Anesthesia Other Findings Past Medical History: No date: Alcohol abuse     Comment:  stopped drinking 42 years ago from today 2020 No date: Anemia No date: AS (aortic stenosis)     Comment:  a. Echo 6/10: EF 55% mild AS; b. echo 06/2015; EF 55-60%,              GR1DD, moderate AS, Peak velocity (S): 346 cm/s. Mean               gradientS): 28 mm Hg. Peak gradient (S): 48 mm Hg. Valve               area (VTI): 1.18 cm2;   c. Echo 4/17 - mild LVH, EF               55-60%, no RWMA, Gr 1 DD, mod to severe AS (mean 28 mmHg,              peak 46 mmHg), mild LAE No date: CAD (coronary artery disease)     Comment:  a. MI 1996 w/ CABG 1996; b.redo CABG 06/2003; c. Myoview               7/09: EF 54% inferobasal infarct, no ischemia. Myoview               6/10 EF 43% inf wall infarct. no ischemia; c. cath 8/16               s/p DES to VG-OM2. OTW 3VD w/ patent VG->RPDA, VG->OM3,                & LIMA->LAD.  EF 55-65%. No date: Carotid bruit    Comment:  2009 0-39% on dopplers bilatrally No date: Chest pain No date: GERD (gastroesophageal reflux disease) No date: History of kidney stones No date: HOH (hard of hearing) No date: HTN (hypertension) No date: Hyperlipidemia 1996: Myocardial infarction ([place]) No date: Nephrolithiasis No date: OSA (obstructive sleep apnea) 02/2014: PAD (peripheral artery disease) ([facility])     Comment:  Subtotal occlusion of right common iliac artery and 70%               stenosis in the left common iliac artery. Status post               bilateral kissing stent placement. Significant post               stenosis aneurysmal dilatation on the right side (any  future catheterization through the right femoral artery               should be done cautiously to avoid advancing the wire               behind the stent struts) No date: Shortness of breath     Comment:  with exertion No date: Thrombocytopenia ([place])  Past Surgical History: 03/06/2014: ABDOMINAL Maxcine Ham; N/A     Comment:  Procedure: ABDOMINAL AORTAGRAM;  Surgeon: Wellington Hampshire, MD;  Location: [place] CATH [facility];  Service:               Cardiovascular;  Laterality: N/A; 02/2014: CARDIAC CATHETERIZATION     Comment:  Severe three-vessel coronary artery disease with patent               grafts.  07/31/2015: CARDIAC CATHETERIZATION; N/A     Comment:  Procedure: Right/Left Heart Cath and Coronary/Graft               Angiography;  Surgeon: Wellington Hampshire, MD;  Location:               [place] CV LAB;  Service: Cardiovascular;                Laterality: N/A; 07/31/2015: CARDIAC CATHETERIZATION; N/A     Comment:  Procedure: Coronary Stent Intervention;  Surgeon:               Wellington Hampshire, MD;  Location: [place] [facility];                Service: Cardiovascular;  Laterality: N/A; 05/26/2016: CARDIAC CATHETERIZATION; N/A     Comment:  Procedure: Right/Left Heart Cath and Coronary               Angiography;   Surgeon: Wellington Hampshire, MD;  Location:               [place] CV [facility];  Service: Cardiovascular;                Laterality: N/A; 10/07/2018: COLONOSCOPY WITH PROPOFOL; N/A     Comment:  Procedure: COLONOSCOPY WITH PROPOFOL;  Surgeon: Lin Landsman, MD;  Location: [facility] ENDOSCOPY;  Service:               Gastroenterology;  Laterality: N/A; 01/08/2019: COLONOSCOPY WITH PROPOFOL; N/A     Comment:  Procedure: COLONOSCOPY WITH PROPOFOL;  Surgeon: Lin Landsman, MD;  Location: [facility] ENDOSCOPY;  Service:               Gastroenterology;  Laterality: N/A; 1991: CORONARY ARTERY BYPASS GRAFT     Comment:  at [facility]. [place] 2004-3 vessels 1st time and 4 second               time No date: CYSTO     Comment:  2/3 times for kidney stones 12/25/2018: ENTEROSCOPY; N/A     Comment:  Procedure: ENTEROSCOPY Single balloon;  Surgeon: Lin Landsman, MD;  Location: [facility] ENDOSCOPY;  Service:               Gastroenterology;  Laterality: N/A; 10/07/2018:  ESOPHAGOGASTRODUODENOSCOPY (EGD) WITH PROPOFOL; N/A     Comment:  Procedure: ESOPHAGOGASTRODUODENOSCOPY (EGD) WITH               PROPOFOL;  Surgeon: Lin Landsman, MD;  Location:               [place];  Service: Gastroenterology;  Laterality:               N/A; 12/22/2018: GIVENS CAPSULE STUDY; N/A     Comment:  Procedure: GIVENS CAPSULE STUDY;  Surgeon: Lin Landsman, MD;  Location: [facility] ENDOSCOPY;  Service:               Gastroenterology;  Laterality: N/A; 11/10/2011: INGUINAL HERNIA REPAIR     Comment:  Procedure: HERNIA REPAIR INGUINAL ADULT;  Surgeon: Donato Heinz;  Location: [facility];  Service: General;                Laterality: Right; 05/15/2018: LEFT HEART CATH AND CORONARY ANGIOGRAPHY; Left     Comment:  Procedure: LEFT HEART CATH AND CORONARY ANGIOGRAPHY;                Surgeon: Wellington Hampshire, MD;  Location: [place]               CV  LAB;  Service: Cardiovascular;  Laterality: Left; 03/06/2014: LEFT HEART CATHETERIZATION WITH CORONARY/GRAFT ANGIOGRAM;  N/A     Comment:  Procedure: LEFT HEART CATHETERIZATION WITH               Beatrix Fetters;  Surgeon: Wellington Hampshire, MD;              Location: [place] CATH LAB;  Service: Cardiovascular;                Laterality: N/A; No date: STOMACH SURGERY     Comment:  removal of gastric ulcers 06/01/2016: TEE WITHOUT CARDIOVERSION; N/A     Comment:  Procedure: TRANSESOPHAGEAL ECHOCARDIOGRAM (TEE);                Surgeon: Sherren Mocha, MD;  Location: [place];  Service:               Open Heart Surgery;  Laterality: N/A; 06/01/2016: TRANSCATHETER AORTIC VALVE REPLACEMENT, TRANSFEMORAL; N/A     Comment:  Procedure: TRANSCATHETER AORTIC VALVE REPLACEMENT,               TRANSFEMORAL;  Surgeon: Sherren Mocha, MD;  Location: [place];  Service: Open Heart Surgery;  Laterality: N/A;     Reproductive/Obstetrics negative OB ROS                             Anesthesia Physical Anesthesia Plan  ASA: III  Anesthesia Plan: General ETT   Post-op Pain Management:    Induction: Intravenous  PONV Risk Score and Plan: Ondansetron, Dexamethasone, Midazolam and Treatment may vary due to age or medical condition  Airway Management Planned: Oral ETT  Additional Equipment:   Intra-op Plan:   Post-operative Plan: Extubation in OR  Informed Consent: I have reviewed the patients History and Physical, chart, labs and discussed the procedure including the risks, benefits and alternatives for the proposed anesthesia with the patient  or authorized representative who has indicated his/her understanding and acceptance.     Dental Advisory Given  Plan Discussed with: Anesthesiologist, CRNA and Surgeon  Anesthesia Plan Comments: (Patient consented for risks of anesthesia including but not limited to:  - adverse reactions to medications - damage to  teeth, lips or other oral mucosa - sore throat or hoarseness - Damage to heart, brain, lungs or loss of life  Patient voiced understanding.)        Anesthesia Quick Evaluation

## 2019-02-09 NOTE — Interval H&P Note (Signed)
History and Physical Interval Note:  02/09/2019 9:10 AM  Matthew [name]  has presented today for surgery, with the diagnosis of CLOLON MASS  The various methods of treatment have been discussed with the patient and family. After consideration of risks, benefits and other options for treatment, the patient has consented to  Procedure(s): LAPAROSCOPIC RIGHT COLECTOMY (Right) as a surgical intervention .  The patient's history has been reviewed, patient examined, no change in status, stable for surgery.  I have reviewed the patient's chart and labs.  Questions were answered to the patient's satisfaction.     [place]

## 2019-02-09 NOTE — Op Note (Signed)
PROCEDURES: 1. Attempted hand assisted laparoscopic right hemicolectomy 2. Open right hemicolectomy with side ileocolostomy 3. Repair of umbilical hernia  Pre-operative Diagnosis: Right colon mass  Post-operative Diagnosis: same  Surgeon: Jules Husbands   Assistants: Dr. Rosana Hoes due to the complexity of the case, for exposure and anastomosis  Anesthesia: General endotracheal anesthesia  ASA Class: 3  Surgeon: Caroleen Hamman , MD FACS  Anesthesia: Gen. with endotracheal tube   Findings: Ileocolostomy widely patent, tension free anastomosis and good perfusion   Estimated Blood Loss: 350cc         Drains: none         Specimens: Right colon                    Condition: stable  Procedure Details  The patient was seen again in the Holding Room. The benefits, complications, treatment options, and expected outcomes were discussed with the patient. The risks of bleeding, infection, recurrence of symptoms, failure to resolve symptoms,  bowel injury, any of which could require further surgery were reviewed with the patient.   The patient was taken to Operating Room, identified as Matthew [name] and the procedure verified.  A Time Out was held and the above information confirmed.  Prior to the induction of general anesthesia, antibiotic prophylaxis was administered. VTE prophylaxis was in place. General endotracheal anesthesia was then administered and tolerated well. After the induction, the abdomen was prepped with Chloraprep and draped in the sterile fashion. The patient was positioned in the supine position.  An incision was created in the midline above the umbilicus using a 15 blade knife.  Electrocautery was used to dissect through the subcutaneous tissue.  We find a 2 cm umbilical hernia that we incorporated into our incision.  The sac was excised and the edges of the fascia were clean.  Identified the omentum and take down the a attachments to the transverse colon.  The GelPort was  placed and two 5 mm ports were placed under direct visualization and palpation.  Pneumoperitoneum was obtained without any hemodynamic changes. Monica scalpel some adhesions were taken down and the attachments from the greater omentum to the transverse colons were taken down.  The hepatic flexure was taken down with harmonic scalpel.  We mobilized the colon in a lateral to medial fashion.  We were able to mobilize the terminal ileum and the appendix.  The duodenum was visualized and protected at all times.  Once we had good mobilization we exteriorized the specimen.  A small window was created 10 cm to the terminal ileum and the terminal ileum was divided with a 75 GIA stapler.  We scored the center of the right colon and selected our point of distal excision to be to the right of the middle colic artery.  A small window was created in the mesentery of the transverse colon and the transverse colon was divided with a 75 GIA stapler.  Using the harmonic scalpel divided some of the mesentery and actually suture ligated the right colic artery.  Once we were dissecting the mesentery there was some venous bleeding that the harmonic scalpel was not able to control.  We did have some blood loss and we rapidly were able to obtain control with a sponge stick.  This was very difficult due to the small incision and I try to place a hemostat and still was unable to have adequate exposure. Because inadequate exposure laparoscopically I decided to increase my incisions and convert to  an open case.  We extended our superior portion of the incision with knife and electrocautery.  We were able to place a Balfour retractor and have good visualization of the mesentery.  There was definitely mesenteric vein that was bleeding and we were able to appropriately obtain control with a figure eight 5-0 Prolene.  Please note that we lost approximately a total of 350 cc throughout the case and mainly was from this mesenteric vein. As we had  adequate control were able to complete our right hemicolectomy by clamping and dividing the mesentery and suture ligating the pedicles.  A side-to-side functional end-to-end anastomosis was created with a 75 mm GIA stapler in the standard fashion.  There was no tension and there was good perfusion to the anastomosis.  Because of the mesentery defect was too large I decided not to close it. The abdomen was irrigated and we changed gloves gowns and place a new clean closure tray.  The abdominal wall was infiltrated with Exparel and the fascia was closed with 2 running 0 PDS sutures in the standard fashion. We incorporated the umbilical hernia within our closure.  Incision was closed with staples.  Sterile dressing was placed.  Needle and laparotomy counts were correct.     Caroleen Hamman, MD, FACS

## 2019-02-09 NOTE — Anesthesia Postprocedure Evaluation (Signed)
Anesthesia Post Note  Patient: Matthew [name]  Procedure(s) Performed: LAPAROSCOPIC RIGHT COLECTOMY (Right )  Patient location during evaluation: PACU Anesthesia Type: General Level of consciousness: awake and alert Pain management: pain level controlled Vital Signs Assessment: post-procedure vital signs reviewed and stable Respiratory status: spontaneous breathing, nonlabored ventilation, respiratory function stable and patient connected to nasal cannula oxygen Cardiovascular status: blood pressure returned to baseline and stable Postop Assessment: no apparent nausea or vomiting Anesthetic complications: no     Last Vitals:  Vitals:   02/09/19 1415 02/09/19 1420  BP: (!) 122/53   Pulse: 69 75  Resp: 11 18  Temp:    SpO2: 93% 93%    Last Pain:  Vitals:   02/09/19 1420  TempSrc:   PainSc: 0-No pain                 Precious Haws Danica Camarena

## 2019-02-10 ENCOUNTER — Inpatient Hospital Stay: Payer: Medicare HMO

## 2019-02-10 DIAGNOSIS — D509 Iron deficiency anemia, unspecified: Secondary | ICD-10-CM | POA: Diagnosis not present

## 2019-02-10 LAB — CBC
HCT: 38.3 % — ABNORMAL LOW (ref 39.0–52.0)
HEMOGLOBIN: 12.1 g/dL — AB (ref 13.0–17.0)
MCH: 27.6 pg (ref 26.0–34.0)
MCHC: 31.6 g/dL (ref 30.0–36.0)
MCV: 87.2 fL (ref 80.0–100.0)
Platelets: 102 10*3/uL — ABNORMAL LOW (ref 150–400)
RBC: 4.39 MIL/uL (ref 4.22–5.81)
RDW: 18.8 % — ABNORMAL HIGH (ref 11.5–15.5)
WBC: 9.2 10*3/uL (ref 4.0–10.5)
nRBC: 0 % (ref 0.0–0.2)

## 2019-02-10 LAB — TYPE AND SCREEN
ABO/RH(D): O NEG
ANTIBODY SCREEN: NEGATIVE
Unit division: 0
Unit division: 0

## 2019-02-10 LAB — MAGNESIUM: MAGNESIUM: 2 mg/dL (ref 1.7–2.4)

## 2019-02-10 LAB — BASIC METABOLIC PANEL
Anion gap: 7 (ref 5–15)
BUN: 12 mg/dL (ref 8–23)
CHLORIDE: 112 mmol/L — AB (ref 98–111)
CO2: 21 mmol/L — AB (ref 22–32)
Calcium: 8.5 mg/dL — ABNORMAL LOW (ref 8.9–10.3)
Creatinine, Ser: 0.91 mg/dL (ref 0.61–1.24)
GFR calc Af Amer: >60 mL/min (ref >60)
GFR calc non Af Amer: >60 mL/min (ref >60)
Glucose, Bld: 194 mg/dL — ABNORMAL HIGH (ref 70–99)
Potassium: 3.6 mmol/L (ref 3.5–5.1)
Sodium: 140 mmol/L (ref 135–145)

## 2019-02-10 LAB — TROPONIN I
TROPONIN I: 0.05 ng/mL — AB (ref <0.03)
Troponin I: 0.16 ng/mL (ref <0.03)
Troponin I: 0.2 ng/mL (ref <0.03)

## 2019-02-10 LAB — HEMOGLOBIN: Hemoglobin: 9.7 g/dL — ABNORMAL LOW (ref 13.0–17.0)

## 2019-02-10 LAB — PREPARE RBC (CROSSMATCH)

## 2019-02-10 LAB — PHOSPHORUS: Phosphorus: 1.7 mg/dL — ABNORMAL LOW (ref 2.5–4.6)

## 2019-02-10 LAB — BPAM RBC
Blood Product Expiration Date: 202003172359
Blood Product Expiration Date: 202003172359
Unit Type and Rh: 9500
Unit Type and Rh: 9500

## 2019-02-10 MED ORDER — NITROGLYCERIN 2 % TD OINT
0.5000 [in_us] | TOPICAL_OINTMENT | Freq: Four times a day (QID) | TRANSDERMAL | Status: DC
  Administered 2019-02-10 – 2019-02-13 (×9): 0.5 [in_us] via TOPICAL
  Filled 2019-02-10 (×10): qty 1

## 2019-02-10 MED ORDER — SACUBITRIL-VALSARTAN 24-26 MG PO TABS
1.0000 | ORAL_TABLET | Freq: Every day | ORAL | Status: DC
  Administered 2019-02-10 – 2019-02-15 (×6): 1 via ORAL
  Filled 2019-02-10 (×7): qty 1

## 2019-02-10 MED ORDER — ATORVASTATIN CALCIUM 20 MG PO TABS
40.0000 mg | ORAL_TABLET | Freq: Every day | ORAL | Status: DC
  Administered 2019-02-10 – 2019-02-14 (×5): 40 mg via ORAL
  Filled 2019-02-10 (×5): qty 2

## 2019-02-10 NOTE — Progress Notes (Signed)
Pt seen and examined around 4pm today. Events noted, chest pain after walking. Did have dark bloody BM. No further episodes. C/P has subsided after nitro was ordered. Hospitalist following from cardiac perspective. Trop 0.05. Dr. Leslye Peer to address for the need of further cardiac w/u.

## 2019-02-10 NOTE — Progress Notes (Addendum)
  Opponent a bit elevated from previous.  We will need to continue to monitor.  Cardiology consultation has been placed by consulting Dr. Earleen Newport.  Patient cannot be on heparin in any case due to ongoing bleeding and colectomy.  I will order echocardiogram  Continue telemetry and monitoring of  troponins   -----------------------------------------------------------------------------------------  Trop-I 0.05, 0.16, 0.20. Spoke to Dr. Dahlia Byes shortly after 2200PM (02/10/2019). We both agreed that systemic anticoagulation in the setting of very recent surgery is contraindicated. I asked if it would be appropriate to start ASA; Dr. Dahlia Byes stated that he would prefer to have Cardiology make this decision, as pt is having hematochezia and is mildly thrombocytopenic (Plt 638) as well, complicating the situation. Dr. Dahlia Byes states he will try to get in touch w/ Dr. Caryl Comes (Cardiology).  -P. Lakeisa Heninger (Nocturnist/Hospitalist)

## 2019-02-10 NOTE — Progress Notes (Signed)
Per Dr. Dahlia Byes order stat EKG, Troponin's and CXR.   Fuller Mandril, RN

## 2019-02-10 NOTE — Progress Notes (Signed)
CRITICAL VALUE ALERT  Critical Value:  1st Troponin 0.05  Date & Time Notied:  02/10/2019 1530  Provider Notified: Dr. Leslye Peer   Orders Received/Actions taken: Continue to monitor and await additional troponin levels.   Fuller Mandril, RN

## 2019-02-10 NOTE — Progress Notes (Signed)
POD # 1 s/p right colectomy Doing well Having BM  And flatus some blood Hb stable AVSS Good UO Tolerating clears  PE NAD, alert in good spirits Abd: soft, dressing intact, no peritonitis or infection Ext: well perfused, no edema Neuro: alert, gcs 15 no deficits  A/p Doing well Advance diet slowly Dc foley Mobilize No chemical proph due to thrombocytopaenia and hight risks bleeding

## 2019-02-10 NOTE — Progress Notes (Signed)
Pt seen and examined. Had 2 melanotic stools, last one around 630-7pm. None since then. His VSS and he is sleeping currently Trop  0.2 d/w Dr. Caryl Comes (cards) about the situation in detail ekg pers. Reviewed and compared . Left bundle block. Difficult to assess for ischemia. He is not having any more chest pain. Difficult situation, currently anticoagulation is contraindicated on a thrombocytopenic pt that dropped 2 gms his hb. We will recheck trop in am and trend hb. I will anticipate that his melena will subside. D/W RN in detail

## 2019-02-10 NOTE — Consult Note (Signed)
[facility] [place] NAME: Matthew [name]    MR#:  [id number]  DATE OF BIRTH:  Aug 14, 1936  DATE OF ADMISSION:  02/09/2019  PRIMARY CARE PHYSICIAN: Antionette Char, MD   REQUESTING/REFERRING PHYSICIAN: Dr Caroleen Hamman  CHIEF COMPLAINT:  Chest pain  HISTORY OF PRESENT ILLNESS:  Matthew [name]  is a 83 y.o. male with a known history of CAD.  He was walking around the nursing station and on his way back he developed chest pain 5-6 out of 10 in intensity.  Now down to 2 out of 10 intensity.  Lasting about 45 minutes.  The patient has not been on his Entresto for a few days.  Hospitalist services were contacted for further evaluation.  Patient states that he cannot take nitroglycerin because it gives him a headache.  He usually takes aspirin for chest pain.  He follows with Dr. Fletcher Anon cardiology.  He is also been having multiple bowel movements of red stool.  PAST MEDICAL HISTORY:   Past Medical History:  Diagnosis Date  . Alcohol abuse    stopped drinking 42 years ago from today 2020  . Anemia   . AS (aortic stenosis)    a. Echo 6/10: EF 55% mild AS; b. echo 06/2015; EF 55-60%, GR1DD, moderate AS, Peak velocity (S): 346 cm/s. Mean gradientS): 28 mm Hg. Peak gradient (S): 48 mm Hg. Valve area (VTI): 1.18 cm2;   c. Echo 4/17 - mild LVH, EF 55-60%, no RWMA, Gr 1 DD, mod to severe AS (mean 28 mmHg, peak 46 mmHg), mild LAE  . CAD (coronary artery disease)    a. MI 1996 w/ CABG 1996; b.redo CABG 06/2003; c. Myoview 7/09: EF 54% inferobasal infarct, no ischemia. Myoview 6/10 EF 43% inf wall infarct. no ischemia; c. cath 8/16 s/p DES to VG-OM2. OTW 3VD w/ patent VG->RPDA, VG->OM3,  & LIMA->LAD.  EF 55-65%.  . Carotid bruit    2009 0-39% on dopplers bilatrally  . Chest pain   . GERD (gastroesophageal reflux disease)   . History of kidney stones   . HOH (hard of hearing)   . HTN (hypertension)   . Hyperlipidemia   . Myocardial infarction ([place]) 1996  .  Nephrolithiasis   . OSA (obstructive sleep apnea)   . PAD (peripheral artery disease) ([place]) 02/2014   Subtotal occlusion of right common iliac artery and 70% stenosis in the left common iliac artery. Status post bilateral kissing stent placement. 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 Shortness of breath    with exertion  . Thrombocytopenia ([place])     PAST SURGICAL HISTOIRY:   Past Surgical History:  Procedure Laterality Date  . ABDOMINAL AORTAGRAM N/A 03/06/2014   Procedure: ABDOMINAL Maxcine Ham;  Surgeon: Wellington Hampshire, MD;  Location: [facility] CATH LAB;  Service: Cardiovascular;  Laterality: N/A;  . CARDIAC CATHETERIZATION  02/2014   Severe three-vessel coronary artery disease with patent grafts.   Marland Kitchen CARDIAC CATHETERIZATION N/A 07/31/2015   Procedure: Right/Left Heart Cath and Coronary/Graft Angiography;  Surgeon: Wellington Hampshire, MD;  Location: [facility] CV LAB;  Service: Cardiovascular;  Laterality: N/A;  . CARDIAC CATHETERIZATION N/A 07/31/2015   Procedure: Coronary Stent Intervention;  Surgeon: Wellington Hampshire, MD;  Location: [facility] CV LAB;  Service: Cardiovascular;  Laterality: N/A;  . CARDIAC CATHETERIZATION N/A 05/26/2016  Procedure: Right/Left Heart Cath and Coronary Angiography;  Surgeon: Wellington Hampshire, MD;  Location: [place] CV LAB;  Service: Cardiovascular;  Laterality: N/A;  . COLONOSCOPY WITH PROPOFOL N/A 10/07/2018   Procedure: COLONOSCOPY WITH PROPOFOL;  Surgeon: Lin Landsman, MD;  Location: [facility] ENDOSCOPY;  Service: Gastroenterology;  Laterality: N/A;  . COLONOSCOPY WITH PROPOFOL N/A 01/08/2019   Procedure: COLONOSCOPY WITH PROPOFOL;  Surgeon: Lin Landsman, MD;  Location: [facility] ENDOSCOPY;  Service: Gastroenterology;  Laterality: N/A;  . CORONARY ARTERY BYPASS GRAFT  1991   at [facility].  Redone 2004-3 vessels 1st time and 4 second time  . CYSTO     2/3 times for kidney stones  . ENTEROSCOPY N/A 12/25/2018   Procedure: ENTEROSCOPY Single balloon;  Surgeon: Lin Landsman, MD;  Location: [facility];  Service: Gastroenterology;  Laterality: N/A;  . ESOPHAGOGASTRODUODENOSCOPY (EGD) WITH PROPOFOL N/A 10/07/2018   Procedure: ESOPHAGOGASTRODUODENOSCOPY (EGD) WITH PROPOFOL;  Surgeon: Lin Landsman, MD;  Location[facility];  Service: Gastroenterology;  Laterality: N/A;  . GIVENS CAPSULE STUDY N/A 12/22/2018   Procedure: GIVENS CAPSULE STUDY;  Surgeon: Lin Landsman, MD;  Location: [facility] ENDOSCOPY;  Service: Gastroenterology;  Laterality: N/A;  . INGUINAL HERNIA REPAIR  11/10/2011   Procedure: HERNIA REPAIR INGUINAL ADULT;  Surgeon: Donato Heinz;  Location: [facility];  Service: General;  Laterality: Right;  . LAPAROSCOPIC RIGHT COLECTOMY Right 02/09/2019   Procedure: LAPAROSCOPIC RIGHT COLECTOMY;  Surgeon: Jules Husbands, MD;  Location: [facility] ORS;  Service: General;  Laterality: Right;  . LEFT HEART CATH AND CORONARY ANGIOGRAPHY Left 05/15/2018   Procedure: LEFT HEART CATH AND CORONARY ANGIOGRAPHY;  Surgeon: Wellington Hampshire, MD;  Location: [place] CV LAB;  Service: Cardiovascular;  Laterality: Left;  . LEFT HEART CATHETERIZATION WITH CORONARY/GRAFT ANGIOGRAM N/A 03/06/2014   Procedure: LEFT HEART CATHETERIZATION WITH Beatrix Fetters;  Surgeon: Wellington Hampshire, MD;  Location: [facility] CATH LAB;  Service: Cardiovascular;  Laterality: N/A;  . STOMACH SURGERY     removal of gastric ulcers  . TEE WITHOUT CARDIOVERSION N/A 06/01/2016   Procedure: TRANSESOPHAGEAL ECHOCARDIOGRAM (TEE);  Surgeon: Sherren Mocha, MD;  Location: [place];  Service: Open Heart Surgery;  Laterality: N/A;  . TRANSCATHETER AORTIC VALVE REPLACEMENT, TRANSFEMORAL N/A 06/01/2016   Procedure: TRANSCATHETER AORTIC VALVE REPLACEMENT, TRANSFEMORAL;  Surgeon: Sherren Mocha, MD;  Location: [place];   Service: Open Heart Surgery;  Laterality: N/A;    SOCIAL HISTORY:   Social History   Tobacco Use  . Smoking status: Former Smoker    Packs/day: 1.00    Years: 50.00    Pack years: 50.00    Types: Cigarettes    Last attempt to quit: 01/24/1990    Years since quitting: 29.0  . Smokeless tobacco: Never Used  Substance Use Topics  . Alcohol use: No    Alcohol/week: 0.0 standard drinks    Comment: Alcoholic in past    FAMILY HISTORY:   Family History  Problem Relation Age of Onset  . Heart disease Father   . Heart disease Mother   . Colon cancer Neg Hx     DRUG ALLERGIES:   Allergies  Allergen Reactions  . Ranexa [Ranolazine Er] Rash    REVIEW OF SYSTEMS:  CONSTITUTIONAL: No fever, fatigue or weakness.  EYES: No blurred or double vision.  EARS, NOSE, AND THROAT: No tinnitus or ear pain.  RESPIRATORY: No cough, some shortness of breath CARDIOVASCULAR: Positive for chest pain.no orthopnea, edema.  GASTROINTESTINAL: No nausea, vomiting, positive for bloody  bowel movements.  Positive for abdominal pain GENITOURINARY: No dysuria, hematuria.  ENDOCRINE: No polyuria, nocturia,  HEMATOLOGY: No anemia, easy bruising or bleeding SKIN: No rash or lesion. MUSCULOSKELETAL: No joint pain or arthritis.   NEUROLOGIC: No tingling, numbness, weakness.  PSYCHIATRY: No anxiety or depression.   MEDICATIONS AT HOME:   Prior to Admission medications   Medication Sig Start Date End Date Taking? Authorizing Provider  acetaminophen (TYLENOL) 325 MG tablet Take 650 mg by mouth daily as needed for mild pain or headache.    Yes [provider]  aspirin EC 81 MG tablet Take 1 tablet (81 mg total) by mouth daily. 03/22/14  Yes Wellington Hampshire, MD  atorvastatin (LIPITOR) 80 MG tablet Take 1 tablet (80 mg total) by mouth daily. 05/23/18  Yes Wellington Hampshire, MD  bisacodyl (DULCOLAX) 5 MG EC tablet Take 4 tablets (5 mg each) at 8 am the day before surgery. 01/24/19  Yes Pabon, Diego F,  MD  carvedilol (COREG) 3.125 MG tablet TAKE 1 TABLET TWICE DAILY Patient taking differently: Take 3.125 mg by mouth 2 (two) times daily with a meal.  01/25/19  Yes Wellington Hampshire, MD  Erythromycin 500 MG TBEC Take 500 mg by mouth as directed. Take two tablets at 8 am, two tablets at 2 pm, and two tablets at 8 pm the day before surgery. 01/24/19  Yes Pabon, Roca, MD  GARLIC PO Take 1 capsule by mouth daily.    Yes [provider]  Multiple Vitamins-Minerals (PRESERVISION AREDS PO) Take 1 tablet by mouth daily.    Yes [provider]  neomycin (MYCIFRADIN) 500 MG tablet Take two tablets at 8 am, two tablets at 2 pm, and two tablets at 8 pm the day before surgery. 01/24/19  Yes Pabon, Diego F, MD  nitroGLYCERIN (NITROSTAT) 0.4 MG SL tablet Place 0.4 mg under the tongue every 5 (five) minutes as needed for chest pain.   Yes [provider]  Omega-3 Fatty Acids (FISH OIL PO) Take 1 capsule by mouth daily.    Yes [provider]  pantoprazole (PROTONIX) 40 MG tablet Take 1 tablet (40 mg total) by mouth daily. 05/31/18  Yes Dunn, Areta Haber, PA-C  polyethylene glycol powder (GLYCOLAX/MIRALAX) powder 255 grams one bottle for bowel prep 01/24/19  Yes Pabon, Diego F, MD  sacubitril-valsartan (ENTRESTO) 24-26 MG Take 1 tablet by mouth 2 (two) times daily. 01/02/19  Yes Wellington Hampshire, MD  tamsulosin (FLOMAX) 0.4 MG CAPS capsule Take 1 capsule (0.4 mg total) by mouth daily. 06/21/18  Yes Dunn, Areta Haber, PA-C  traMADol Veatrice Bourbon) 50 MG tablet Take 1 tablet (50 mg total) daily by mouth. 12/12/17 01/29/19 Yes Milinda Pointer, MD  TURMERIC PO Take 1 capsule by mouth at bedtime.    Yes [provider]  sacubitril-valsartan (ENTRESTO) 24-26 MG Take 1 tablet by mouth 2 (two) times daily. Patient not taking: Reported on 01/29/2019 01/03/19   Wellington Hampshire, MD      VITAL SIGNS:  Blood pressure (!) 174/74, pulse 79, temperature 97.6 F (36.4 C), temperature source Oral,  resp. rate 18, SpO2 99 %.  PHYSICAL EXAMINATION:  GENERAL:  83 y.o.-year-old patient lying in the bed with no acute distress.  EYES: Pupils equal, round, reactive to light and accommodation. No scleral icterus. Extraocular muscles intact.  HEENT: Head atraumatic, normocephalic. Oropharynx and nasopharynx clear.  NECK:  Supple, no jugular venous distention. No thyroid enlargement, no tenderness.  LUNGS: Normal breath sounds bilaterally,  no wheezing, rales,rhonchi or crepitation. No use of accessory muscles of respiration.  CARDIOVASCULAR: S1, S2 normal.  2 out of 6 systolic murmurs.no rubs, or gallops.  ABDOMEN: Soft, tender, nondistended. Bowel sounds present. No organomegaly or mass.  EXTREMITIES: No pedal edema, cyanosis, or clubbing.  NEUROLOGIC: Cranial nerves II through XII are intact. Muscle strength 5/5 in all extremities. Sensation intact. Gait not checked.  PSYCHIATRIC: The patient is alert and oriented x 3.  SKIN: No obvious rash, lesion, or ulcer.   LABORATORY PANEL:   CBC Recent Labs  Lab 02/10/19 0343  WBC 9.2  HGB 12.1*  HCT 38.3*  PLT 102*   ------------------------------------------------------------------------------------------------------------------  Chemistries  Recent Labs  Lab 02/10/19 0343  NA 140  K 3.6  CL 112*  CO2 21*  GLUCOSE 194*  BUN 12  CREATININE 0.91  CALCIUM 8.5*  MG 2.0   ------------------------------------------------------------------------------------------------------------------      EKG:   Left bundle branch block 64 bpm  IMPRESSION AND PLAN:   1.  Chest pain with history of CAD.  Limited secondary to rectal bleeding with blood thinners at this time.  Will consult cardiology.  Serial troponins.  Trial of nitro patch on the leg.  Continue Coreg and Entresto.  Obtain a chest x-ray. 2.  GI bleed with recent colon surgery.  Serial hemoglobins.  Looking back at records patient did have numerous polyps that were removed and  on recent small bowel endoscopy did have some AVMs.  Serial hemoglobins.  Trying to reach surgeon again about this being seen postoperatively. 3.  Chronic thrombocytopenia 4.  Hyperlipidemia unspecified restart atorvastatin 5.  History of aortic stenosis status post valve replacement   All the records are reviewed and case discussed with Consulting provider. Management plans discussed with the patient, family and they are in agreement.  CODE STATUS: Full code  TOTAL TIME TAKING CARE OF THIS PATIENT: 50 minutes.    Loletha Grayer M.D on 02/10/2019 at 2:31 PM  Between 7am to 6pm - Pager - [phone]  After 6pm go to [email or web address] - password [email or web address]  [facility]  Office  [phone]  CC: Primary care Physician: Antionette Char, MD

## 2019-02-10 NOTE — Progress Notes (Addendum)
Patient had two loose large bloody stools. Patient returned back to bed complaining of chest pain 5/10, BP 174/74. Contacted Dr. Dahlia Byes.   Fuller Mandril, RN

## 2019-02-11 ENCOUNTER — Other Ambulatory Visit: Payer: Self-pay

## 2019-02-11 ENCOUNTER — Inpatient Hospital Stay (HOSPITAL_COMMUNITY)
Admission: RE | Admit: 2019-02-11 | Discharge: 2019-02-11 | Disposition: A | Discharge status: Home / Self Care | Payer: Medicare HMO | Source: Home / Self Care | Attending: Internal Medicine | Admitting: Internal Medicine

## 2019-02-11 DIAGNOSIS — R778 Other specified abnormalities of plasma proteins: Secondary | ICD-10-CM

## 2019-02-11 DIAGNOSIS — I361 Nonrheumatic tricuspid (valve) insufficiency: Secondary | ICD-10-CM

## 2019-02-11 DIAGNOSIS — I340 Nonrheumatic mitral (valve) insufficiency: Secondary | ICD-10-CM

## 2019-02-11 DIAGNOSIS — R7989 Other specified abnormal findings of blood chemistry: Secondary | ICD-10-CM

## 2019-02-11 DIAGNOSIS — I447 Left bundle-branch block, unspecified: Secondary | ICD-10-CM

## 2019-02-11 LAB — CBC
HCT: 30.1 % — ABNORMAL LOW (ref 39.0–52.0)
Hemoglobin: 9.5 g/dL — ABNORMAL LOW (ref 13.0–17.0)
MCH: 27.5 pg (ref 26.0–34.0)
MCHC: 31.6 g/dL (ref 30.0–36.0)
MCV: 87 fL (ref 80.0–100.0)
Platelets: 90 10*3/uL — ABNORMAL LOW (ref 150–400)
RBC: 3.46 MIL/uL — ABNORMAL LOW (ref 4.22–5.81)
RDW: 19.6 % — ABNORMAL HIGH (ref 11.5–15.5)
WBC: 8.9 10*3/uL (ref 4.0–10.5)
nRBC: 0 % (ref 0.0–0.2)

## 2019-02-11 LAB — BASIC METABOLIC PANEL
Anion gap: 5 (ref 5–15)
BUN: 11 mg/dL (ref 8–23)
CO2: 23 mmol/L (ref 22–32)
Calcium: 8.4 mg/dL — ABNORMAL LOW (ref 8.9–10.3)
Chloride: 111 mmol/L (ref 98–111)
Creatinine, Ser: 0.87 mg/dL (ref 0.61–1.24)
GFR calc Af Amer: >60 mL/min (ref >60)
GFR calc non Af Amer: >60 mL/min (ref >60)
Glucose, Bld: 132 mg/dL — ABNORMAL HIGH (ref 70–99)
Potassium: 3.4 mmol/L — ABNORMAL LOW (ref 3.5–5.1)
Sodium: 139 mmol/L (ref 135–145)

## 2019-02-11 LAB — HEMOGLOBIN A1C
Hgb A1c MFr Bld: 5.3 % (ref 4.8–5.6)
Mean Plasma Glucose: 105.41 mg/dL

## 2019-02-11 LAB — TROPONIN I
Troponin I: 0.3 ng/mL (ref <0.03)
Troponin I: 0.33 ng/mL (ref <0.03)
Troponin I: 0.32 ng/mL (ref <0.03)
Troponin I: 0.32 ng/mL (ref <0.03)

## 2019-02-11 LAB — PREPARE RBC (CROSSMATCH)

## 2019-02-11 LAB — PROTIME-INR
INR: 1.1 (ref 0.8–1.2)
Prothrombin Time: 14.2 seconds (ref 11.4–15.2)

## 2019-02-11 LAB — ECHOCARDIOGRAM COMPLETE

## 2019-02-11 LAB — APTT: aPTT: 35 seconds (ref 24–36)

## 2019-02-11 MED ORDER — SODIUM CHLORIDE 0.9% IV SOLUTION
Freq: Once | INTRAVENOUS | Status: AC
  Administered 2019-02-11: 19:00:00 via INTRAVENOUS

## 2019-02-11 MED ORDER — PERFLUTREN LIPID MICROSPHERE
1.0000 mL | INTRAVENOUS | Status: AC | PRN
  Administered 2019-02-11: 5 mL via INTRAVENOUS
  Filled 2019-02-11: qty 10

## 2019-02-11 NOTE — Progress Notes (Signed)
I sent Dr Jodell Cipro a secure chat message about patient's last troponin of .30. His response was:" Dr Dahlia Byes wanted to wait on cardiology to render an opinion. Patient is not c/o any chest pain, VSS, tele shows sinus rhythm and nitro paste is in place. Cardiology consult in place and Dr Dahlia Byes spoke to Dr Caryl Comes about patient earlier tonight.

## 2019-02-11 NOTE — Consult Note (Signed)
Cardiology Consultation:   Patient ID: Matthew [name] MRN: [id number]; DOB: 1936-06-14  Admit date: 02/09/2019 Date of Consult: 02/11/2019  Primary Care Provider: Antionette Char, MD Primary Cardiologist: [name], MD  Primary Electrophysiologist:  None    Patient Profile:   Matthew [name] is a 83 y.o. male with a hx of ischemic cardiomyopathy and chronic angina  who is being seen today for the evaluation of + troponin and chest pain at the request of Dr .Dahlia Byes  History of Present Illness:   Matthew [name] hospitalized 2/28 for right hemicolectomy for a right colonic mass.   Postoperatively he developed chest pain with exertion.  Also having dark bloody BM -- chronic mild thrombocytopenia  History of bypass surgery 1991 in 2004.  S/p TAVR 6/38 complicated by left bundle branch block.    He has chronic predictable exertional shortness of breath and chest discomfort.  Relieved by rest.  Increasingly a problem over the last 6-12 months.  No edema.  DATE TEST EF   4/17 Echo   55-60 %   6/17 LHC   Grafts patent  6/18` Echo  45-50   6/19 LHC  LIMA-LAD; RIMA D; SVG OM3, SVG RCA --patent SVG OM2 occluded  10/19 Echo   30-35 %   3/20 Echo  20-25    Also noted to have had PVCs.  Holter 6/19-5600.  Hospitalized 10/19 with a GI bleed.  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  Date Hgb  10/19 7.1  11/19 12.4  1/20 10.0  2/20 13.4  3/20 9.5     Past Medical History:  Diagnosis Date  . Alcohol abuse    stopped drinking 42 years ago from today 2020  . Anemia   . AS (aortic stenosis)    a. Echo 6/10: EF 55% mild AS; b. echo 06/2015; EF 55-60%, GR1DD, moderate AS, Peak velocity (S): 346 cm/s. Mean gradientS): 28 mm Hg. Peak gradient (S): 48 mm Hg. Valve area (VTI): 1.18 cm2;   c. Echo 4/17 - mild LVH, EF 55-60%, no RWMA, Gr 1 DD, mod to severe AS (mean 28 mmHg, peak 46 mmHg), mild LAE  . CAD (coronary artery disease)    a. MI 1996 w/ CABG 1996; b.redo CABG 06/2003; c. Myoview 7/09: EF 54%  inferobasal infarct, no ischemia. Myoview 6/10 EF 43% inf wall infarct. no ischemia; c. cath 8/16 s/p DES to VG-OM2. OTW 3VD w/ patent VG->RPDA, VG->OM3,  & LIMA->LAD.  EF 55-65%.  . Carotid bruit    2009 0-39% on dopplers bilatrally  . Chest pain   . GERD (gastroesophageal reflux disease)   . History of kidney stones   . HOH (hard of hearing)   . HTN (hypertension)   . Hyperlipidemia   . Myocardial infarction ([place]) 1996  . Nephrolithiasis   . OSA (obstructive sleep apnea)   . PAD (peripheral artery disease) ([place]) 02/2014   Subtotal occlusion of right common iliac artery and 70% stenosis in the left common iliac artery. Status post bilateral kissing stent placement. 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 Shortness of breath    with exertion  . Thrombocytopenia ([place])     Past Surgical History:  Procedure Laterality Date  . ABDOMINAL AORTAGRAM N/A 03/06/2014   Procedure: ABDOMINAL Maxcine Ham;  Surgeon: Wellington Hampshire, MD;  Location: [place] CATH LAB;  Service: Cardiovascular;  Laterality: N/A;  . CARDIAC CATHETERIZATION  02/2014   Severe three-vessel coronary artery disease with patent grafts.   Marland Kitchen CARDIAC CATHETERIZATION N/A 07/31/2015   Procedure: Right/Left Heart Cath and Coronary/Graft Angiography;  Surgeon: Wellington Hampshire, MD;  Location: [place] CV LAB;  Service: Cardiovascular;  Laterality: N/A;  . CARDIAC CATHETERIZATION N/A 07/31/2015   Procedure: Coronary Stent Intervention;  Surgeon: Wellington Hampshire, MD;  Location: [place] CV LAB;  Service: Cardiovascular;  Laterality: N/A;  . CARDIAC CATHETERIZATION N/A 05/26/2016   Procedure: Right/Left Heart Cath and Coronary Angiography;  Surgeon: Wellington Hampshire, MD;  Location: [place] CV LAB;  Service: Cardiovascular;  Laterality: N/A;  . COLONOSCOPY WITH  PROPOFOL N/A 10/07/2018   Procedure: COLONOSCOPY WITH PROPOFOL;  Surgeon: Lin Landsman, MD;  Location: [facility] ENDOSCOPY;  Service: Gastroenterology;  Laterality: N/A;  . COLONOSCOPY WITH PROPOFOL N/A 01/08/2019   Procedure: COLONOSCOPY WITH PROPOFOL;  Surgeon: Lin Landsman, MD;  Location: [facility] ENDOSCOPY;  Service: Gastroenterology;  Laterality: N/A;  . CORONARY ARTERY BYPASS GRAFT  1991   at [facility]. Redone 2004-3 vessels 1st time and 4 second time  . CYSTO     2/3 times for kidney stones  . ENTEROSCOPY N/A 12/25/2018   Procedure: ENTEROSCOPY Single balloon;  Surgeon: Lin Landsman, MD;  Location: [place];  Service: Gastroenterology;  Laterality: N/A;  . ESOPHAGOGASTRODUODENOSCOPY (EGD) WITH PROPOFOL N/A 10/07/2018   Procedure: ESOPHAGOGASTRODUODENOSCOPY (EGD) WITH PROPOFOL;  Surgeon: Lin Landsman, MD;  Location: [facility] ENDOSCOPY;  Service: Gastroenterology;  Laterality: N/A;  . GIVENS CAPSULE STUDY N/A 12/22/2018   Procedure: GIVENS CAPSULE STUDY;  Surgeon: Lin Landsman, MD;  Location: [facility] ENDOSCOPY;  Service: Gastroenterology;  Laterality: N/A;  . INGUINAL HERNIA REPAIR  11/10/2011   Procedure: HERNIA REPAIR INGUINAL ADULT;  Surgeon: Donato Heinz;  Location: [facility];  Service: General;  Laterality: Right;  . LAPAROSCOPIC RIGHT COLECTOMY Right 02/09/2019   Procedure: LAPAROSCOPIC RIGHT COLECTOMY;  Surgeon: Jules Husbands, MD;  Location: [facility] ORS;  Service: General;  Laterality: Right;  . LEFT HEART CATH AND CORONARY ANGIOGRAPHY Left 05/15/2018   Procedure: LEFT HEART CATH AND CORONARY ANGIOGRAPHY;  Surgeon: Wellington Hampshire, MD;  Location: [place] CV LAB;  Service: Cardiovascular;  Laterality: Left;  . LEFT HEART CATHETERIZATION WITH CORONARY/GRAFT ANGIOGRAM N/A 03/06/2014   Procedure: LEFT HEART CATHETERIZATION WITH Beatrix Fetters;  Surgeon: Wellington Hampshire, MD;  Location: [place] CATH LAB;  Service: Cardiovascular;  Laterality: N/A;  . STOMACH  SURGERY     removal of gastric ulcers  . TEE WITHOUT CARDIOVERSION N/A 06/01/2016   Procedure: TRANSESOPHAGEAL ECHOCARDIOGRAM (TEE);  Surgeon: Sherren Mocha, MD;  Location: [place];  Service: Open Heart Surgery;  Laterality: N/A;  . TRANSCATHETER AORTIC VALVE REPLACEMENT, TRANSFEMORAL N/A 06/01/2016   Procedure: TRANSCATHETER AORTIC VALVE REPLACEMENT, TRANSFEMORAL;  Surgeon: Sherren Mocha, MD;  Location: [place];  Service: Open Heart Surgery;  Laterality: N/A;       Inpatient Medications: Scheduled Meds: . acetaminophen  1,000 mg Oral Q6H  . atorvastatin  40 mg Oral q1800  . carvedilol  3.125 mg Oral BID WC  . nitroGLYCERIN  0.5 inch Topical Q6H  . pantoprazole (PROTONIX) IV  40 mg Intravenous QHS  . sacubitril-valsartan  1 tablet Oral Daily  . tamsulosin  0.4 mg Oral Daily   Continuous Infusions: . dextrose 5% lactated ringers 30 mL/hr at 02/10/19 2315   PRN Meds: hydrALAZINE, morphine injection, nitroGLYCERIN, ondansetron **OR** ondansetron (ZOFRAN) IV, oxyCODONE, prochlorperazine **OR**  prochlorperazine  Allergies:    Allergies  Allergen Reactions  . Ranexa [Ranolazine Er] Rash    Social History:   Social History   Socioeconomic History  . Marital status: Married    Spouse name: Not on file  . Number of children: 2  . Years of education: Not on file  . Highest education level: Not on file  Occupational History  . Occupation: Retired  Scientific laboratory technician  . Financial resource strain: Not on file  . Food insecurity:    Worry: Not on file    Inability: Not on file  . Transportation needs:    Medical: Not on file    Non-medical: Not on file  Tobacco Use  . Smoking status: Former Smoker    Packs/day: 1.00    Years: 50.00    Pack years: 50.00    Types: Cigarettes    Last attempt to quit: 01/24/1990    Years since quitting: 29.0  . Smokeless tobacco: Never Used  Substance and Sexual Activity  . Alcohol use: No    Alcohol/week: 0.0 standard drinks    Comment: Alcoholic  in past  . Drug use: No  . Sexual activity: Never  Lifestyle  . Physical activity:    Days per week: Not on file    Minutes per session: Not on file  . Stress: Not on file  Relationships  . Social connections:    Talks on phone: Not on file    Gets together: Not on file    Attends religious service: Not on file    Active member of club or organization: Not on file    Attends meetings of clubs or organizations: Not on file    Relationship status: Not on file  . Intimate partner violence:    Fear of current or ex partner: Not on file    Emotionally abused: Not on file    Physically abused: Not on file    Forced sexual activity: Not on file  Other Topics Concern  . Not on file  Social History Narrative   Married an lives with wife in [place]. Retired form [employer].     Family History:     Family History  Problem Relation Age of Onset  . Heart disease Father   . Heart disease Mother   . Colon cancer Neg Hx      ROS:  Please see the history of present illness.    All other ROS reviewed and negative.     Physical Exam/Data:   Vitals:   02/10/19 1840 02/10/19 2026 02/11/19 0508 02/11/19 1319  BP: (!) 140/48 (!) 138/45 (!) 140/56 (!) 153/60  Pulse: 73 60 72 70  Resp: 18 20 18 17   Temp: 98.1 F (36.7 C) 98.2 F (36.8 C) 98.9 F (37.2 C) 98.2 F (36.8 C)  TempSrc: Oral Oral Oral Oral  SpO2: 95% 94% 97% 99%    Intake/Output Summary (Last 24 hours) at 02/11/2019 1444 Last data filed at 02/11/2019 0600 Gross per 24 hour  Intake 455.84 ml  Output 450 ml  Net 5.84 ml   Last 3 Weights 02/02/2019 01/24/2019 01/17/2019  Weight (lbs) 197 lb 14.4 oz 193 lb 193 lb  Weight (kg) 89.767 kg 87.544 kg 87.544 kg  Some encounter information is confidential and restricted. Go to Review Flowsheets activity to see all data.    General:  Well nourished, well developed, in no acute distress  HEENT: normal Lymph: no adenopathy Neck: no JVD Endocrine:  No  thryomegaly Vascular: No carotid bruits; FA pulses 2+ bilaterally without bruits  Cardiac:  normal S1, S2; RRR; 2/6 systolic m** Lungs:  clear to auscultation bilaterally, no wheezing, rhonchi or rales  Abd: not examined  Ext: no edema Musculoskeletal:  No deformities, BUE and BLE strength normal and equal Skin: warm and dry  Neuro:  CNs 2-12 intact, no focal abnormalities noted Psych:  Normal affect   EKG:  The EKG was personally reviewed and demonstrates:  Sinus with LBBB Multiple ECGs were reviewed back to the time of his TAVR.  Preoperatively QRS was narrow postoperatively left bundle branch block present and is been present ever since.  PVCs are variably present.  Telemetry:  Telemetry was personally reviewed and demonstrates:  Sinus w frequent PVCs ranging from 5-15/min, prob averaging about 10     Laboratory Data:  Chemistry Recent Labs  Lab 02/09/19 1542 02/10/19 0343 02/11/19 0149  NA  --  140 139  K  --  3.6 3.4*  CL  --  112* 111  CO2  --  21* 23  GLUCOSE  --  194* 132*  BUN  --  12 11  CREATININE 0.87 0.91 0.87  CALCIUM  --  8.5* 8.4*  GFRNONAA >60 >60 >60  GFRAA >60 >60 >60  ANIONGAP  --  7 5    No results for input(s): PROT, ALBUMIN, AST, ALT, ALKPHOS, BILITOT in the last 168 hours. Hematology Recent Labs  Lab 02/09/19 1542 02/10/19 0343 02/10/19 1949 02/11/19 0149  WBC 11.1* 9.2  --  8.9  RBC 4.79 4.39  --  3.46*  HGB 13.3 12.1* 9.7* 9.5*  HCT 41.7 38.3*  --  30.1*  MCV 87.1 87.2  --  87.0  MCH 27.8 27.6  --  27.5  MCHC 31.9 31.6  --  31.6  RDW 18.8* 18.8*  --  19.6*  PLT 93* 102*  --  90*   Cardiac Enzymes Recent Labs  Lab 02/10/19 1423 02/10/19 1949 02/10/19 2101 02/11/19 0149 02/11/19 0907 02/11/19 1230  TROPONINI 0.05* 0.16* 0.20* 0.30* 0.33* 0.32*   No results for input(s): TROPIPOC in the last 168 hours.  BNPNo results for input(s): BNP, PROBNP in the last 168 hours.  DDimer No results for input(s): DDIMER in the last 168  hours.  Radiology/Studies:  Dg Chest 1 View  Result Date: 02/10/2019 CLINICAL DATA:  Chest pain EXAM: CHEST  1 VIEW COMPARISON:  10/01/2018 FINDINGS: The heart is mildly enlarged. Normal vascularity. Postoperative changes from CABG. Low lung volumes. Bibasilar atelectasis. IMPRESSION: Cardiomegaly without decompensation.  Bibasilar atelectasis. Electronically Signed   By: Marybelle Killings M.D.   On: 02/10/2019 14:57    Assessment and Plan:  Exertional chest pain and shortness of breath   Elevated troponin  S/p Colectomy  GI bleeding ongoing  Anemia  Ischemic cardiomyopathy with prior bypass  TAVR  Left bundle branch block complicating TAVR  Cardiomyopathy  New post TAVI  PVCs   Borderline troponin elevation.  Catheterization less than a year ago showed patent grafts except for 1 which was significantly occluded.  Given his ongoing GI bleeding, agree with internal medicine that heparin is inappropriate.  Long-term perspective, he has a significant cardiomyopathy which is been progressive since the time of his TAVI.  The new left bundle branch block occurring as a consequence of the procedure has been associated with the development of cardiomyopathy.  This could be reversed in many cases by resynchronization.  Another possibility is his ongoing PVC burden.  Holter monitoring 6/19 demonstrated only about 5%; while in hospital the number has been probably closer to 15%.  Consideration should be given to antiarrhythmic suppression of his PVCs to see whether they improve and then if not, consideration of resynchronization.  Would have a low threshold for transfusion given his history of angina and borderline troponin.     For questions or updates, please contact [place] Please consult [email or web address] for contact info under     Signed, Virl Axe, MD  02/11/2019 2:44 PM

## 2019-02-11 NOTE — Progress Notes (Signed)
Patient had a large dark brown BM stain on pad.

## 2019-02-11 NOTE — Progress Notes (Signed)
POD # 2 Last BM this am and was formed, could not tell if there was blood AVSS No c/p Hb stabilized 9.5 Trop elevated 0.3 cardiology aware Echo done  PE NAD Abd: soft, wound healing well, no infection or peritonitis  A/p Doing well Likely demand ischemia I do not think that anticoagulation would be in his best interest given his thrombocytopenia and recent GI bleed Awaiting final card recs

## 2019-02-11 NOTE — Progress Notes (Signed)
[place] [facility] [place] NAME: Matthew [name]    MR#:  [id number]  DATE OF BIRTH:  [birth year]  SUBJECTIVE:  CHIEF COMPLAINT:  No chief complaint on file. Had 2 melanotic stools, feels tired and weak,  no other complaints, getting echo REVIEW OF SYSTEMS:  Review of Systems  Constitutional: Positive for malaise/fatigue. Negative for diaphoresis, fever and weight loss.  HENT: Negative for ear discharge, ear pain, hearing loss, nosebleeds, sore throat and tinnitus.   Eyes: Negative for blurred vision and pain.  Respiratory: Negative for cough, hemoptysis, shortness of breath and wheezing.   Cardiovascular: Negative for chest pain, palpitations, orthopnea and leg swelling.  Gastrointestinal: Positive for melena. Negative for abdominal pain, blood in stool, constipation, diarrhea, heartburn, nausea and vomiting.  Genitourinary: Negative for dysuria, frequency and urgency.  Musculoskeletal: Negative for back pain and myalgias.  Skin: Negative for itching and rash.  Neurological: Negative for dizziness, tingling, tremors, focal weakness, seizures, weakness and headaches.  Psychiatric/Behavioral: Negative for depression. The patient is not nervous/anxious.     DRUG ALLERGIES:   Allergies  Allergen Reactions  . Ranexa [Ranolazine Er] Rash   VITALS:  Blood pressure (!) 140/56, pulse 72, temperature 98.9 F (37.2 C), temperature source Oral, resp. rate 18, SpO2 97 %. PHYSICAL EXAMINATION:  Physical Exam HENT:     Head: Normocephalic and atraumatic.  Eyes:     Conjunctiva/sclera: Conjunctivae normal.     Pupils: Pupils are equal, round, and reactive to light.  Neck:     Musculoskeletal: Normal range of motion and neck supple.     Thyroid: No thyromegaly.     Trachea: No tracheal deviation.  Cardiovascular:     Rate and Rhythm: Normal rate and regular rhythm.     Heart sounds: Normal heart sounds.  Pulmonary:     Effort: Pulmonary effort is  normal. No respiratory distress.     Breath sounds: Normal breath sounds. No wheezing.  Chest:     Chest wall: No tenderness.  Abdominal:     General: Bowel sounds are normal. There is no distension.     Palpations: Abdomen is soft.     Tenderness: There is no abdominal tenderness.  Musculoskeletal: Normal range of motion.  Skin:    General: Skin is warm and dry.     Findings: No rash.  Neurological:     Mental Status: He is alert and oriented to person, place, and time.     Cranial Nerves: No cranial nerve deficit.    LABORATORY PANEL:  Male CBC Recent Labs  Lab 02/11/19 0149  WBC 8.9  HGB 9.5*  HCT 30.1*  PLT 90*   ------------------------------------------------------------------------------------------------------------------ Chemistries  Recent Labs  Lab 02/10/19 0343 02/11/19 0149  NA 140 139  K 3.6 3.4*  CL 112* 111  CO2 21* 23  GLUCOSE 194* 132*  BUN 12 11  CREATININE 0.91 0.87  CALCIUM 8.5* 8.4*  MG 2.0  --    RADIOLOGY:  Dg Chest 1 View  Result Date: 02/10/2019 CLINICAL DATA:  Chest pain EXAM: CHEST  1 VIEW COMPARISON:  10/01/2018 FINDINGS: The heart is mildly enlarged. Normal vascularity. Postoperative changes from CABG. Low lung volumes. Bibasilar atelectasis. IMPRESSION: Cardiomegaly without decompensation.  Bibasilar atelectasis. Electronically Signed   By: Marybelle Killings M.D.   On: 02/10/2019 14:57   ASSESSMENT AND PLAN:   1.  Chest pain with history of CAD.  Limited secondary to rectal bleeding with blood thinners at this time.  cardiology c/s pending.  Serial troponins.  Trial of nitro patch on the leg.  Continue Coreg and Entresto.  Echo pending 2.  GI bleed with recent colon surgery: further mgmt per surgery. Hb 9.5 today 3.  Chronic thrombocytopenia - platelets 90 today. monitor 4.  Hyperlipidemia unspecified - continue atorvastatin 5.  History of aortic stenosis status post valve replacement     All the records are reviewed and case  discussed with Care Management/Social Worker. Management plans discussed with the patient, family and they are in agreement.  CODE STATUS: Full Code  TOTAL TIME TAKING CARE OF THIS PATIENT: 35 minutes.   More than 50% of the time was spent in counseling/coordination of care: Valaria Good M.D on 02/11/2019 at 12:07 PM  Between 7am to 6pm - Pager - [phone]  After 6pm go to [email or web address] - password [email or web address] [facility]  [facility]  Office  [phone]  CC: Primary care physician; Antionette Char, MD  Note: This dictation was prepared with Dragon dictation along with smaller phrase technology. Any transcriptional errors that result from this process are unintentional.

## 2019-02-11 NOTE — Progress Notes (Signed)
D/w Dr Caryl Comes who feels 1 PRBC transfusion would help considering drop in Hb and chest pain, elevated troponins. I agree and have ordered 1 PRBC.

## 2019-02-12 ENCOUNTER — Encounter: Payer: Self-pay | Admitting: Oncology

## 2019-02-12 DIAGNOSIS — K921 Melena: Secondary | ICD-10-CM

## 2019-02-12 DIAGNOSIS — D509 Iron deficiency anemia, unspecified: Secondary | ICD-10-CM

## 2019-02-12 DIAGNOSIS — D696 Thrombocytopenia, unspecified: Secondary | ICD-10-CM

## 2019-02-12 LAB — CBC
HCT: 29.2 % — ABNORMAL LOW (ref 39.0–52.0)
Hemoglobin: 9.3 g/dL — ABNORMAL LOW (ref 13.0–17.0)
MCH: 27.7 pg (ref 26.0–34.0)
MCHC: 31.8 g/dL (ref 30.0–36.0)
MCV: 86.9 fL (ref 80.0–100.0)
Platelets: 95 10*3/uL — ABNORMAL LOW (ref 150–400)
RBC: 3.36 MIL/uL — ABNORMAL LOW (ref 4.22–5.81)
RDW: 18.5 % — ABNORMAL HIGH (ref 11.5–15.5)
WBC: 7.8 10*3/uL (ref 4.0–10.5)
nRBC: 0 % (ref 0.0–0.2)

## 2019-02-12 LAB — BASIC METABOLIC PANEL
Anion gap: 6 (ref 5–15)
BUN: 11 mg/dL (ref 8–23)
CO2: 25 mmol/L (ref 22–32)
Calcium: 8.3 mg/dL — ABNORMAL LOW (ref 8.9–10.3)
Chloride: 109 mmol/L (ref 98–111)
Creatinine, Ser: 0.8 mg/dL (ref 0.61–1.24)
GFR calc Af Amer: >60 mL/min (ref >60)
GFR calc non Af Amer: >60 mL/min (ref >60)
GLUCOSE: 125 mg/dL — AB (ref 70–99)
POTASSIUM: 3.4 mmol/L — AB (ref 3.5–5.1)
Sodium: 140 mmol/L (ref 135–145)

## 2019-02-12 LAB — TROPONIN I: Troponin I: 0.27 ng/mL

## 2019-02-12 LAB — PREPARE RBC (CROSSMATCH)

## 2019-02-12 MED ORDER — SODIUM CHLORIDE 0.9% IV SOLUTION
Freq: Once | INTRAVENOUS | Status: AC
  Administered 2019-02-12: 14:00:00 via INTRAVENOUS

## 2019-02-12 NOTE — Care Management Important Message (Signed)
Copy of signed Medicare IM left with patient in room. 

## 2019-02-12 NOTE — Consult Note (Signed)
Matthew Darby, MD [address]  [place]  [place], [place] [address]  Main: [phone]  Fax: [phone] Pager: [phone]   Consultation  Referring Provider:     No ref. provider found Primary Care Physician:  Antionette Char, MD Primary Gastroenterologist:  Dr. Sherri Sear         Reason for Consultation:     Melena  Date of Admission:  02/09/2019 Date of Consultation:  02/12/2019         HPI:   Matthew [name] is a 83 y.o. male with history of ischemic cardiomyopathy, cad s/p CABG, aortic stenosis status post TAVR, iron deficiency anemia secondary to gastric and small bowel AVMs, underwent EGD, colonoscopy and small bowel enteroscopy with treatment of AVMs.  His iron deficiency anemia resolved.  On colonoscopy for evaluation of iron deficiency anemia, he was found to have large flat polyp in the cecum which was unresectable endoscopically.  Therefore, patient was referred to Dr. Perrin Maltese, patient underwent right hemicolectomy with primary ileocolonic anastomosis on 02/09/2019.  Surgery went well, with no immediate postop complications.  Yesterday, patient noticed passing dark red blood per rectum and today morning he had passed black stool.  His hemoglobin postoperatively dropped from 13.3 on day of surgery to 9.3 today.  He also had mildly elevated troponin and chest pain.  He received 1 unit of PRBCs yesterday, receiving 1 unit of PRBCs today.  He also has chronic mild thrombocytopenia which has been stable.  He reports having 1 small smear of bowel movement this afternoon.  Dr. Dahlia Byes requested me to see this patient with his history of AVMs and melena if attributing to the GI bleed.  When I spoke to patient, he reports soreness at the surgical incision site.  Denies chest pain, shortness of breath.  His wife is bedside.  He is lying comfortably in bed.  He has been tolerating full liquid diet well  NSAIDs: None  Antiplts/Anticoagulants/Anti thrombotics: Aspirin 81 mg  GI  Procedures:  EGD 10/07/2018 - Normal duodenal bulb and second portion of the duodenum. - 2 cm hiatal hernia. 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 Treated with argon plasma coagulation (APC). Clips (MR conditional) were placed. - Normal gastroesophageal junction and esophagus. - No specimens collected.  Colonoscopy 10/07/2018 - Preparation of the colon was poor. - No lower GI source of melena identified - One 12 mm polyp in the cecum. Resection not attempted. - One 10 mm polyp in the transverse colon, removed with a hot snare. Resected and retrieved. - Normal mucosa in the entire examined colon. - Stool in the entire examined colon. - Severe diverticulosis in the sigmoid colon. There was no evidence of diverticular bleeding. - Non-bleeding external and internal hemorrhoids. DIAGNOSIS:  A. COLON POLYP, TRANSVERSE; HOT SNARE:  - TUBULAR ADENOMA.  - NEGATIVE FOR HIGH-GRADE DYSPLASIA AND MALIGNANCY.   .Capsule endoscopy 12/22/2018 Small bowel small bowel AVMs in the proximal small bowel as well as large nonbleeding AVM in the duodenum  Small bowel enteroscopy 12/25/2018 - A few non-bleeding angioectasias in the duodenum. Treated with argon plasma coagulation (APC). - A few non-bleeding angioectasias in the jejunum. Treated with argon plasma coagulation (APC). - No specimens collected.  Colonoscopy 01/08/2019 - One 15 mm polyp in the cecum, removed with mucosal resection. Incomplete resection. Resected tissue retrieved. Injected. - Five 4 to 6 mm polyps in the ascending colon, removed with a hot snare. Resected and retrieved. - Two 8 mm polyps  in the transverse colon, removed with a hot snare. Resected and retrieved. - One 5 mm polyp in the transverse colon, removed with a cold snare. Resected and retrieved. - Three 7 to 9 mm polyps in the descending colon, removed with a hot snare. Resected and retrieved. - One 9 mm polyp in the sigmoid colon, removed with a hot snare.  Resected and retrieved. - The distal rectum and anal verge are normal on retroflexion view. - Diverticulosis in the sigmoid colon.   Past Medical History:  Diagnosis Date  . Alcohol abuse    stopped drinking 42 years ago from today 2020  . Anemia   . AS (aortic stenosis)    a. Echo 6/10: EF 55% mild AS; b. echo 06/2015; EF 55-60%, GR1DD, moderate AS, Peak velocity (S): 346 cm/s. Mean gradientS): 28 mm Hg. Peak gradient (S): 48 mm Hg. Valve area (VTI): 1.18 cm2;   c. Echo 4/17 - mild LVH, EF 55-60%, no RWMA, Gr 1 DD, mod to severe AS (mean 28 mmHg, peak 46 mmHg), mild LAE  . CAD (coronary artery disease)    a. MI 1996 w/ CABG 1996; b.redo CABG 06/2003; c. Myoview 7/09: EF 54% inferobasal infarct, no ischemia. Myoview 6/10 EF 43% inf wall infarct. no ischemia; c. cath 8/16 s/p DES to VG-OM2. OTW 3VD w/ patent VG->RPDA, VG->OM3,  & LIMA->LAD.  EF 55-65%.  . Carotid bruit    2009 0-39% on dopplers bilatrally  . Chest pain   . GERD (gastroesophageal reflux disease)   . History of kidney stones   . HOH (hard of hearing)   . HTN (hypertension)   . Hyperlipidemia   . Myocardial infarction ([facility]) 1996  . Nephrolithiasis   . OSA (obstructive sleep apnea)   . PAD (peripheral artery disease) ([facility]) 02/2014   Subtotal occlusion of right common iliac artery and 70% stenosis in the left common iliac artery. Status post bilateral kissing stent placement. 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 Shortness of breath    with exertion  . Thrombocytopenia ([facility])     Past Surgical History:  Procedure Laterality Date  . ABDOMINAL AORTAGRAM N/A 03/06/2014   Procedure: ABDOMINAL Maxcine Ham;  Surgeon: Wellington Hampshire, MD;  Location: [facility] C[facility];  Service: Cardiovascular;  Laterality: N/A;  . CARDIAC CATHETERIZATION  02/2014   Severe three-vessel  coronary artery disease with patent grafts.   Marland Kitchen CARDIAC CATHETERIZATION N/A 07/31/2015   Procedure: Right/Left Heart Cath and Coronary/Graft Angiography;  Surgeon: Wellington Hampshire, MD;  Location: [facility];  Service: Cardiovascular;  Laterality: N/A;  . CARDIAC CATHETERIZATION N/A 07/31/2015   Procedure: Coronary Stent Intervention;  Surgeon: Wellington Hampshire, MD;  Location: [facility];  Service: Cardiovascular;  Laterality: N/A;  . CARDIAC CATHETERIZATION N/A 05/26/2016   Procedure: Right/Left Heart Cath and Coronary Angiography;  Surgeon: Wellington Hampshire, MD;  Location: [facility];  Service: Cardiovascular;  Laterality: N/A;  . COLONOSCOPY WITH PROPOFOL N/A 10/07/2018   Procedure: COLONOSCOPY WITH PROPOFOL;  Surgeon: Lin Landsman, MD;  Location: [facility] ENDOSCOPY;  Service: Gastroenterology;  Laterality: N/A;  . COLONOSCOPY WITH PROPOFOL N/A 01/08/2019   Procedure: COLONOSCOPY WITH PROPOFOL;  Surgeon: Lin Landsman, MD;  Location: [facility] ENDOSCOPY;  Service: Gastroenterology;  Laterality: N/A;  . CORONARY ARTERY BYPASS GRAFT  1991   at [facility].  Redone 2004-3 vessels 1st time and 4 second time  . CYSTO     2/3 times for kidney stones  . ENTEROSCOPY N/A 12/25/2018   Procedure: ENTEROSCOPY Single balloon;  Surgeon: Lin Landsman, MD;  Location: [facility];  Service: Gastroenterology;  Laterality: N/A;  . ESOPHAGOGASTRODUODENOSCOPY (EGD) WITH PROPOFOL N/A 10/07/2018   Procedure: ESOPHAGOGASTRODUODENOSCOPY (EGD) WITH PROPOFOL;  Surgeon: Lin Landsman, MD;  Location: [facility] ENDOSCOPY;  Service: Gastroenterology;  Laterality: N/A;  . GIVENS CAPSULE STUDY N/A 12/22/2018   Procedure: GIVENS CAPSULE STUDY;  Surgeon: Lin Landsman, MD;  Location: [facility] ENDOSCOPY;  Service: Gastroenterology;  Laterality: N/A;  . INGUINAL HERNIA REPAIR  11/10/2011   Procedure: HERNIA REPAIR INGUINAL ADULT;  Surgeon: Donato Heinz;  Location: [facility];  Service: General;   Laterality: Right;  . LAPAROSCOPIC RIGHT COLECTOMY Right 02/09/2019   Procedure: LAPAROSCOPIC RIGHT COLECTOMY;  Surgeon: Jules Husbands, MD;  Location: [facility] ORS;  Service: General;  Laterality: Right;  . LEFT HEART CATH AND CORONARY ANGIOGRAPHY Left 05/15/2018   Procedure: LEFT HEART CATH AND CORONARY ANGIOGRAPHY;  Surgeon: Wellington Hampshire, MD;  Location: [facility];  Service: Cardiovascular;  Laterality: Left;  . LEFT HEART CATHETERIZATION WITH CORONARY/GRAFT ANGIOGRAM N/A 03/06/2014   Procedure: LEFT HEART CATHETERIZATION WITH Beatrix Fetters;  Surgeon: Wellington Hampshire, MD;  Location: [facility] CATH LAB;  Service: Cardiovascular;  Laterality: N/A;  . STOMACH SURGERY     removal of gastric ulcers  . TEE WITHOUT CARDIOVERSION N/A 06/01/2016   Procedure: TRANSESOPHAGEAL ECHOCARDIOGRAM (TEE);  Surgeon: Sherren Mocha, MD;  Location: [facility];  Service: Open Heart Surgery;  Laterality: N/A;  . TRANSCATHETER AORTIC VALVE REPLACEMENT, TRANSFEMORAL N/A 06/01/2016   Procedure: TRANSCATHETER AORTIC VALVE REPLACEMENT, TRANSFEMORAL;  Surgeon: Sherren Mocha, MD;  Location: [facility];  Service: Open Heart Surgery;  Laterality: N/A;    Prior to Admission medications   Medication Sig Start Date End Date Taking? Authorizing Provider  acetaminophen (TYLENOL) 325 MG tablet Take 650 mg by mouth daily as needed for mild pain or headache.    Yes [provider]  aspirin EC 81 MG tablet Take 1 tablet (81 mg total) by mouth daily. 03/22/14  Yes Wellington Hampshire, MD  atorvastatin (LIPITOR) 80 MG tablet Take 1 tablet (80 mg total) by mouth daily. 05/23/18  Yes Wellington Hampshire, MD  bisacodyl (DULCOLAX) 5 MG EC tablet Take 4 tablets (5 mg each) at 8 am the day before surgery. 01/24/19  Yes Pabon, Diego F, MD  carvedilol (COREG) 3.125 MG tablet TAKE 1 TABLET TWICE DAILY Patient taking differently: Take 3.125 mg by mouth 2 (two) times daily with a meal.  01/25/19  Yes Wellington Hampshire, MD  Erythromycin 500 MG  TBEC Take 500 mg by mouth as directed. Take two tablets at 8 am, two tablets at 2 pm, and two tablets at 8 pm the day before surgery. 01/24/19  Yes Pabon, [place], MD  GARLIC PO Take 1 capsule by mouth daily.    Yes [provider]  Multiple Vitamins-Minerals (PRESERVISION AREDS PO) Take 1 tablet by mouth daily.    Yes [provider]  neomycin (MYCIFRADIN) 500 MG tablet Take two tablets at 8 am, two tablets at 2 pm, and two tablets at 8 pm the day before surgery. 01/24/19  Yes Pabon, Diego F, MD  nitroGLYCERIN (NITROSTAT) 0.4 MG SL tablet Place 0.4 mg under the tongue every 5 (five) minutes as needed for chest pain.   Yes  [provider]  Omega-3 Fatty Acids (FISH OIL PO) Take 1 capsule by mouth daily.    Yes [provider]  pantoprazole (PROTONIX) 40 MG tablet Take 1 tablet (40 mg total) by mouth daily. 05/31/18  Yes Dunn, Areta Haber, PA-C  polyethylene glycol powder (GLYCOLAX/MIRALAX) powder 255 grams one bottle for bowel prep 01/24/19  Yes Pabon, Diego F, MD  sacubitril-valsartan (ENTRESTO) 24-26 MG Take 1 tablet by mouth 2 (two) times daily. 01/02/19  Yes Wellington Hampshire, MD  tamsulosin (FLOMAX) 0.4 MG CAPS capsule Take 1 capsule (0.4 mg total) by mouth daily. 06/21/18  Yes Dunn, Areta Haber, PA-C  traMADol Veatrice Bourbon) 50 MG tablet Take 1 tablet (50 mg total) daily by mouth. 12/12/17 01/29/19 Yes Milinda Pointer, MD  TURMERIC PO Take 1 capsule by mouth at bedtime.    Yes [provider]  sacubitril-valsartan (ENTRESTO) 24-26 MG Take 1 tablet by mouth 2 (two) times daily. Patient not taking: Reported on 01/29/2019 01/03/19   Wellington Hampshire, MD    Current Facility-Administered Medications:  .  acetaminophen (TYLENOL) tablet 1,000 mg, 1,000 mg, Oral, Q6H, Pabon, Diego F, MD, 1,000 mg at 02/12/19 1358 .  atorvastatin (LIPITOR) tablet 40 mg, 40 mg, Oral, q1800, Loletha Grayer, MD, 40 mg at 02/11/19 1846 .  carvedilol (COREG) tablet 3.125 mg, 3.125 mg, Oral, BID  WC, Pabon, Diego F, MD, 3.125 mg at 02/12/19 1002 .  dextrose 5 % in lactated ringers infusion, , Intravenous, Continuous, Pabon, Diego F, MD, Last Rate: 20 mL/hr at 02/12/19 1237 .  hydrALAZINE (APRESOLINE) injection 10 mg, 10 mg, Intravenous, Q2H PRN, Pabon, Diego F, MD .  morphine 2 MG/ML injection 2 mg, 2 mg, Intravenous, Q4H PRN, Pabon, Diego F, MD .  nitroGLYCERIN (NITROGLYN) 2 % ointment 0.5 inch, 0.5 inch, Topical, Q6H, Wieting, Richard, MD, 0.5 inch at 02/12/19 1357 .  nitroGLYCERIN (NITROSTAT) SL tablet 0.4 mg, 0.4 mg, Sublingual, Q5 min PRN, Pabon, Diego F, MD .  ondansetron (ZOFRAN-ODT) disintegrating tablet 4 mg, 4 mg, Oral, Q6H PRN **OR** ondansetron (ZOFRAN) injection 4 mg, 4 mg, Intravenous, Q6H PRN, Pabon, Diego F, MD .  oxyCODONE (Oxy IR/ROXICODONE) immediate release tablet 5-10 mg, 5-10 mg, Oral, Q4H PRN, Pabon, Diego F, MD .  pantoprazole (PROTONIX) injection 40 mg, 40 mg, Intravenous, QHS, Pabon, Diego F, MD, 40 mg at 02/11/19 2231 .  prochlorperazine (COMPAZINE) tablet 10 mg, 10 mg, Oral, Q6H PRN **OR** prochlorperazine (COMPAZINE) injection 5-10 mg, 5-10 mg, Intravenous, Q6H PRN, Pabon, Diego F, MD .  sacubitril-valsartan (ENTRESTO) 24-26 mg per tablet, 1 tablet, Oral, Daily, Pabon, Diego F, MD, 1 tablet at 02/12/19 1002 .  tamsulosin (FLOMAX) capsule 0.4 mg, 0.4 mg, Oral, Daily, Pabon, Diego F, MD, 0.4 mg at 02/12/19 1001  Family History  Problem Relation Age of Onset  . Heart disease Father   . Heart disease Mother   . Colon cancer Neg Hx      Social History   Tobacco Use  . Smoking status: Former Smoker    Packs/day: 1.00    Years: 50.00    Pack years: 50.00    Types: Cigarettes    Last attempt to quit: 01/24/1990    Years since quitting: 29.0  . Smokeless tobacco: Never Used  Substance Use Topics  . Alcohol use: No    Alcohol/week: 0.0 standard drinks    Comment: Alcoholic in past  . Drug use: No    Allergies as of 01/24/2019 - Review Complete  01/24/2019  Allergen  Reaction Noted  . Ranexa [ranolazine er] Rash 07/19/2018    Review of Systems:    All systems reviewed and negative except where noted in HPI.   Physical Exam:  Vital signs in last 24 hours: Temp:  [97.7 F (36.5 C)-99.6 F (37.6 C)] 99.6 F (37.6 C) (03/02 1505) Pulse Rate:  [66-79] 69 (03/02 1505) Resp:  [15-20] 15 (03/02 1505) BP: (125-140)/(45-70) 140/57 (03/02 1505) SpO2:  [94 %-98 %] 97 % (03/02 1505) Last BM Date: 02/12/19   General:   Pleasant, cooperative in NAD Head:  Normocephalic and atraumatic. Eyes:   No icterus.   Conjunctiva pink. PERRLA. Ears:  Normal auditory acuity. Neck:  Supple; no masses or thyroidomegaly Lungs: Respirations even and unlabored. Lungs clear to auscultation bilaterally.   No wheezes, crackles, or rhonchi.  Heart:  Regular rate and rhythm;  Without murmur, clicks, rubs or gallops Abdomen:  Soft, nondistended, mild tenderness, incision from recent surgery, appears healthy. Normal bowel sounds. No appreciable masses or hepatomegaly.  No rebound or guarding.  Rectal: Dried, black stool in the perianal area as well as on rectal exam Msk:  Symmetrical without gross deformities.  Strength generalized weakness Extremities:  Without edema, cyanosis or clubbing. Neurologic:  Alert and oriented x3;  grossly normal neurologically. Skin:  Intact without significant lesions or rashes. Psych:  Alert and cooperative. Normal affect.  LAB RESULTS: CBC Latest Ref Rng & Units 02/12/2019 02/11/2019 02/10/2019  WBC 4.0 - 10.5 K/uL 7.8 8.9 -  Hemoglobin 13.0 - 17.0 g/dL 9.3(L) 9.5(L) 9.7(L)  Hematocrit 39.0 - 52.0 % 29.2(L) 30.1(L) -  Platelets 150 - 400 K/uL 95(L) 90(L) -    BMET BMP Latest Ref Rng & Units 02/12/2019 02/11/2019 02/10/2019  Glucose 70 - 99 mg/dL 125(H) 132(H) 194(H)  BUN 8 - 23 mg/dL 11 11 12   Creatinine 0.61 - 1.24 mg/dL 0.80 0.87 0.91  BUN/Creat Ratio 10 - 24 - - -  Sodium 135 - 145 mmol/L 140 139 140  Potassium 3.5 - 5.1  mmol/L 3.4(L) 3.4(L) 3.6  Chloride 98 - 111 mmol/L 109 111 112(H)  CO2 22 - 32 mmol/L 25 23 21(L)  Calcium 8.9 - 10.3 mg/dL 8.3(L) 8.4(L) 8.5(L)    LFT Hepatic Function Latest Ref Rng & Units 01/17/2019 04/11/2018 08/31/2017  Total Protein 6.5 - 8.1 g/dL 6.9 6.3 6.9  Albumin 3.5 - 5.0 g/dL 4.5 4.3 4.6  AST 15 - 41 U/L 17 13 19   ALT 0 - 44 U/L 17 14 -  Alk Phosphatase 38 - 126 U/L 64 59 60  Total Bilirubin 0.3 - 1.2 mg/dL 1.2 0.8 1.4(H)  Bilirubin, Direct 0.00 - 0.40 mg/dL - - -     STUDIES: No results found.    Impression / Plan:   Matthew [name] is a 83 y.o. male with history of coronary disease status post CABG, ischemic cardiomyopathy, aortic stenosis status post TAVR, iron deficiency anemia secondary to gastric and small bowel AVMs, large flat polyp in the cecum, unresectable, status post right hemicolectomy with primary ileocolonic anastomosis on 02/09/2019.  GI is consulted to evaluate for hematochezia/melena which started postop day 2.  The source of GI bleed is most likely from recent ileocolonic anastomosis based on patient's history.  Presence of dark blood yesterday and melena today suggests bleeding from anastomosis and likely he is clearing up.  He does have history of small bowel AVMs and that is an alternate possibility for source of GI bleed.  Given his extensive cardiac history, I  would like to monitor at this time unless patient has ongoing melena or if his hemoglobin continues to drop.  At that time, we can perform EGD/small bowel enteroscopy +/-colonoscopy.  I discussed personally my recommendations with Dr. Dahlia Byes, patient and his wife Continue Protonix 40 mg daily Okay with full liquid diet  Thank you for involving me in the care of this patient.  Will follow along with you    LOS: 3 days   Sherri Sear, MD  02/12/2019, 5:49 PM   Note: This dictation was prepared with Dragon dictation along with smaller phrase technology. Any transcriptional errors that result from  this process are unintentional.

## 2019-02-12 NOTE — Consult Note (Addendum)
Hematology/Oncology Consult note [facility] [phone] [phone]  Patient Care Team: Antionette Char, MD as PCP - General (Family Medicine) Wellington Hampshire, MD as PCP - Cardiology (Cardiology) Wellington Hampshire, MD as Consulting Physician (Cardiology) Daneil Dolin, MD as Consulting Physician (Gastroenterology)   Name of the patient: Matthew [name]  [id number]  [birth year]    Reason for consult: GI bleeding   Requesting physician: Dr. 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  Most recently he was noted to have iron deficiency anemia and received IV iron as an outpatient.  He was seen by Dr. Owens Shark the and underwent EGD and colonoscopy.  At that time he was found to have a flat polyp requiring surgical resection on 02/09/2019.  We are currently awaiting final pathology.  Patient was noted to have some melena.  Last episode of melena was this morning.  He subsequently developed chest pain which is thought to be supply demand ischemia in the setting of coronary artery disease.  His hemoglobin dropped from a baseline of 12-9 and given his anginal symptoms he received 1 unit of PRBC yesterday and today.  Platelet counts have remained stable in the 90s.  Patient reports that he has had small bowel movement since this morning but no overt melena.   Pain scale- 0   Review of systems- Review of Systems  Constitutional: Positive for malaise/fatigue. Negative for chills, fever and weight loss.  HENT: Negative for congestion, ear discharge and nosebleeds.   Eyes: Negative for blurred vision.  Respiratory: Negative for cough, hemoptysis, sputum production, shortness of breath and wheezing.     Cardiovascular: Negative for chest pain, palpitations, orthopnea and claudication.  Gastrointestinal: Negative for abdominal pain, blood in stool, constipation, diarrhea, heartburn, melena, nausea and vomiting.  Genitourinary: Negative for dysuria, flank pain, frequency, hematuria and urgency.  Musculoskeletal: Negative for back pain, joint pain and myalgias.  Skin: Negative for rash.  Neurological: Negative for dizziness, tingling, focal weakness, seizures, weakness and headaches.  Endo/Heme/Allergies: Does not bruise/bleed easily.  Psychiatric/Behavioral: Negative for depression and suicidal ideas. The patient does not have insomnia.     Allergies  Allergen Reactions  . Ranexa [Ranolazine Er] Rash    Patient Active Problem List   Diagnosis Date Noted  . Colonic mass 02/09/2019  . Polyp of cecum   . History of colonic polyps   . AVM (arteriovenous malformation) of small bowel, acquired   . Iron deficiency anemia 10/26/2018  . Symptomatic anemia 10/01/2018  . Lumbar spinal stenosis 12/26/2017  . Abnormal MRI, lumbar spine 12/26/2017  . Chronic lumbar radiculopathy 12/26/2017  . Chronic lower extremity radicular pain (S1 dermatome) (Bilateral) (L>R) 11/15/2017  . Long term current use of anticoagulant (Plavix) 09/28/2017  . History of MI (myocardial infarction) (Last 2004) 09/28/2017  . History of alcoholism ([place]) 09/28/2017  . Neurogenic pain 09/28/2017  . Lumbar facet syndrome (Bilateral) (L>R) 09/28/2017  . DDD (degenerative disc disease), lumbar 09/28/2017  . Lumbar facet osteoarthritis 09/28/2017  . Lumbar spondylosis with radiculopathy 09/28/2017  . Gastroesophageal reflux disease without esophagitis 09/28/2017  . Chronic hip pain 09/01/2017  . Long term current use of opiate analgesic 08/31/2017  . Long term prescription opiate use 08/31/2017  . Opiate use 08/31/2017  .  Chronic low back pain (Primary Area of Pain) (Bilateral) (L>R) 08/31/2017  . Chronic lower  extremity pain (Bilateral) (L>R) 08/31/2017  . Chronic pain syndrome 08/31/2017  . Chronic sacroiliac joint pain (Bilateral) (L>R) 08/31/2017  . Severe aortic stenosis 05/25/2016  . Thrombocytopenia ([place])   . Coronary artery disease 08/01/2015  . Effort angina ([place]) 07/31/2015  . PAD (peripheral artery disease) ([place]) 03/22/2014  . Angina pectoris ([place]) 02/14/2014  . Aortic valve Replacement 05/01/2009  . Obstructive sleep apnea 03/05/2008  . HLD (hyperlipidemia) 03/04/2008  . Essential hypertension 03/04/2008  . Coronary atherosclerosis 03/04/2008     Past Medical History:  Diagnosis Date  . Alcohol abuse    stopped drinking 42 years ago from today 2020  . Anemia   . AS (aortic stenosis)    a. Echo 6/10: EF 55% mild AS; b. echo 06/2015; EF 55-60%, GR1DD, moderate AS, Peak velocity (S): 346 cm/s. Mean gradientS): 28 mm Hg. Peak gradient (S): 48 mm Hg. Valve area (VTI): 1.18 cm2;   c. Echo 4/17 - mild LVH, EF 55-60%, no RWMA, Gr 1 DD, mod to severe AS (mean 28 mmHg, peak 46 mmHg), mild LAE  . CAD (coronary artery disease)    a. MI 1996 w/ CABG 1996; b.redo CABG 06/2003; c. Myoview 7/09: EF 54% inferobasal infarct, no ischemia. Myoview 6/10 EF 43% inf wall infarct. no ischemia; c. cath 8/16 s/p DES to VG-OM2. OTW 3VD w/ patent VG->RPDA, VG->OM3,  & LIMA->LAD.  EF 55-65%.  . Carotid bruit    2009 0-39% on dopplers bilatrally  . Chest pain   . GERD (gastroesophageal reflux disease)   . History of kidney stones   . HOH (hard of hearing)   . HTN (hypertension)   . Hyperlipidemia   . Myocardial infarction ([place]) 1996  . Nephrolithiasis   . OSA (obstructive sleep apnea)   . PAD (peripheral artery disease) ([place]) 02/2014   Subtotal occlusion of right common iliac artery and 70% stenosis in the left common iliac artery. Status post bilateral kissing stent placement. Significant post stenosis aneurysmal dilatation on the right side (any future catheterization through the right femoral artery  should be done cautiously to avoid advancing the wire behind the stent struts)  . Shortness of breath    with exertion  . Thrombocytopenia ([place])      Past Surgical History:  Procedure Laterality Date  . ABDOMINAL AORTAGRAM N/A 03/06/2014   Procedure: ABDOMINAL Maxcine Ham;  Surgeon: Wellington Hampshire, MD;  Location: [place] CATH LAB;  Service: Cardiovascular;  Laterality: N/A;  . CARDIAC CATHETERIZATION  02/2014   Severe three-vessel coronary artery disease with patent grafts.   Marland Kitchen CARDIAC CATHETERIZATION N/A 07/31/2015   Procedure: Right/Left Heart Cath and Coronary/Graft Angiography;  Surgeon: Wellington Hampshire, MD;  Location: [place] CV LAB;  Service: Cardiovascular;  Laterality: N/A;  . CARDIAC CATHETERIZATION N/A 07/31/2015   Procedure: Coronary Stent Intervention;  Surgeon: Wellington Hampshire, MD;  Location: [place] CV LAB;  Service: Cardiovascular;  Laterality: N/A;  . CARDIAC CATHETERIZATION N/A 05/26/2016   Procedure: Right/Left Heart Cath and Coronary Angiography;  Surgeon: Wellington Hampshire, MD;  Location: [place] CV LAB;  Service: Cardiovascular;  Laterality: N/A;  . COLONOSCOPY WITH PROPOFOL N/A 10/07/2018   Procedure: COLONOSCOPY WITH PROPOFOL;  Surgeon: Lin Landsman, MD;  Location: [facility] ENDOSCOPY;  Service: Gastroenterology;  Laterality: N/A;  . COLONOSCOPY WITH PROPOFOL N/A 01/08/2019   Procedure: COLONOSCOPY WITH PROPOFOL;  Surgeon: Lin Landsman, MD;  Location: [facility]  ENDOSCOPY;  Service: Gastroenterology;  Laterality: N/A;  . CORONARY ARTERY BYPASS GRAFT  1991   at [facility]. Redone 2004-3 vessels 1st time and 4 second time  . CYSTO     2/3 times for kidney stones  . ENTEROSCOPY N/A 12/25/2018   Procedure: ENTEROSCOPY Single balloon;  Surgeon: Lin Landsman, MD;  Location: [place];  Service: Gastroenterology;  Laterality: N/A;  . ESOPHAGOGASTRODUODENOSCOPY (EGD) WITH PROPOFOL N/A 10/07/2018   Procedure: ESOPHAGOGASTRODUODENOSCOPY (EGD) WITH PROPOFOL;   Surgeon: Lin Landsman, MD;  Location: [facility] ENDOSCOPY;  Service: Gastroenterology;  Laterality: N/A;  . GIVENS CAPSULE STUDY N/A 12/22/2018   Procedure: GIVENS CAPSULE STUDY;  Surgeon: Lin Landsman, MD;  Location: [facility] ENDOSCOPY;  Service: Gastroenterology;  Laterality: N/A;  . INGUINAL HERNIA REPAIR  11/10/2011   Procedure: HERNIA REPAIR INGUINAL ADULT;  Surgeon: Donato Heinz;  Location: [facility];  Service: General;  Laterality: Right;  . LAPAROSCOPIC RIGHT COLECTOMY Right 02/09/2019   Procedure: LAPAROSCOPIC RIGHT COLECTOMY;  Surgeon: Jules Husbands, MD;  Location: [facility] ORS;  Service: General;  Laterality: Right;  . LEFT HEART CATH AND CORONARY ANGIOGRAPHY Left 05/15/2018   Procedure: LEFT HEART CATH AND CORONARY ANGIOGRAPHY;  Surgeon: Wellington Hampshire, MD;  Location: [place] CV LAB;  Service: Cardiovascular;  Laterality: Left;  . LEFT HEART CATHETERIZATION WITH CORONARY/GRAFT ANGIOGRAM N/A 03/06/2014   Procedure: LEFT HEART CATHETERIZATION WITH Beatrix Fetters;  Surgeon: Wellington Hampshire, MD;  Location: [place] CATH LAB;  Service: Cardiovascular;  Laterality: N/A;  . STOMACH SURGERY     removal of gastric ulcers  . TEE WITHOUT CARDIOVERSION N/A 06/01/2016   Procedure: TRANSESOPHAGEAL ECHOCARDIOGRAM (TEE);  Surgeon: Sherren Mocha, MD;  Location: [place];  Service: Open Heart Surgery;  Laterality: N/A;  . TRANSCATHETER AORTIC VALVE REPLACEMENT, TRANSFEMORAL N/A 06/01/2016   Procedure: TRANSCATHETER AORTIC VALVE REPLACEMENT, TRANSFEMORAL;  Surgeon: Sherren Mocha, MD;  Location: [place];  Service: Open Heart Surgery;  Laterality: N/A;    Social History   Socioeconomic History  . Marital status: Married    Spouse name: Not on file  . Number of children: 2  . Years of education: Not on file  . Highest education level: Not on file  Occupational History  . Occupation: Retired  Scientific laboratory technician  . Financial resource strain: Not on file  . Food insecurity:    Worry: Not on file     Inability: Not on file  . Transportation needs:    Medical: Not on file    Non-medical: Not on file  Tobacco Use  . Smoking status: Former Smoker    Packs/day: 1.00    Years: 50.00    Pack years: 50.00    Types: Cigarettes    Last attempt to quit: 01/24/1990    Years since quitting: 29.0  . Smokeless tobacco: Never Used  Substance and Sexual Activity  . Alcohol use: No    Alcohol/week: 0.0 standard drinks    Comment: Alcoholic in past  . Drug use: No  . Sexual activity: Never  Lifestyle  . Physical activity:    Days per week: Not on file    Minutes per session: Not on file  . Stress: Not on file  Relationships  . Social connections:    Talks on phone: Not on file    Gets together: Not on file    Attends religious service: Not on file    Active member of club or organization: Not on file    Attends meetings of clubs  or organizations: Not on file    Relationship status: Not on file  . Intimate partner violence:    Fear of current or ex partner: Not on file    Emotionally abused: Not on file    Physically abused: Not on file    Forced sexual activity: Not on file  Other Topics Concern  . Not on file  Social History Narrative   Married an lives with wife in [place]. Retired form [employer].      Family History  Problem Relation Age of Onset  . Heart disease Father   . Heart disease Mother   . Colon cancer Neg Hx      Current Facility-Administered Medications:  .  acetaminophen (TYLENOL) tablet 1,000 mg, 1,000 mg, Oral, Q6H, Pabon, Diego F, MD, 1,000 mg at 02/12/19 1358 .  atorvastatin (LIPITOR) tablet 40 mg, 40 mg, Oral, q1800, Loletha Grayer, MD, 40 mg at 02/11/19 1846 .  carvedilol (COREG) tablet 3.125 mg, 3.125 mg, Oral, BID WC, Pabon, Diego F, MD, 3.125 mg at 02/12/19 1002 .  dextrose 5 % in lactated ringers infusion, , Intravenous, Continuous, Pabon, Diego F, MD, Last Rate: 20 mL/hr at 02/12/19 1237 .  hydrALAZINE (APRESOLINE) injection 10 mg,  10 mg, Intravenous, Q2H PRN, Pabon, Diego F, MD .  morphine 2 MG/ML injection 2 mg, 2 mg, Intravenous, Q4H PRN, Pabon, Diego F, MD .  nitroGLYCERIN (NITROGLYN) 2 % ointment 0.5 inch, 0.5 inch, Topical, Q6H, Wieting, Richard, MD, 0.5 inch at 02/12/19 1357 .  nitroGLYCERIN (NITROSTAT) SL tablet 0.4 mg, 0.4 mg, Sublingual, Q5 min PRN, Pabon, Diego F, MD .  ondansetron (ZOFRAN-ODT) disintegrating tablet 4 mg, 4 mg, Oral, Q6H PRN **OR** ondansetron (ZOFRAN) injection 4 mg, 4 mg, Intravenous, Q6H PRN, Pabon, Diego F, MD .  oxyCODONE (Oxy IR/ROXICODONE) immediate release tablet 5-10 mg, 5-10 mg, Oral, Q4H PRN, Pabon, Diego F, MD .  pantoprazole (PROTONIX) injection 40 mg, 40 mg, Intravenous, QHS, Pabon, Diego F, MD, 40 mg at 02/11/19 2231 .  prochlorperazine (COMPAZINE) tablet 10 mg, 10 mg, Oral, Q6H PRN **OR** prochlorperazine (COMPAZINE) injection 5-10 mg, 5-10 mg, Intravenous, Q6H PRN, Pabon, Diego F, MD .  sacubitril-valsartan (ENTRESTO) 24-26 mg per tablet, 1 tablet, Oral, Daily, Pabon, Diego F, MD, 1 tablet at 02/12/19 1002 .  tamsulosin (FLOMAX) capsule 0.4 mg, 0.4 mg, Oral, Daily, Pabon, [place] F, MD, 0.4 mg at 02/12/19 1001   Physical exam:  Vitals:   02/12/19 0957 02/12/19 1123 02/12/19 1430 02/12/19 1505  BP: 126/61 135/65 (!) 137/57 (!) 140/57  Pulse: 72 66 71 69  Resp:   16 15  Temp:  98.5 F (36.9 C) 98.2 F (36.8 C) 99.6 F (37.6 C)  TempSrc:  Oral Oral Oral  SpO2: 98% 97% 95% 97%   Physical Exam Constitutional:      General: He is not in acute distress. HENT:     Head: Normocephalic and atraumatic.  Eyes:     Pupils: Pupils are equal, round, and reactive to light.  Neck:     Musculoskeletal: Normal range of motion.  Cardiovascular:     Rate and Rhythm: Normal rate and regular rhythm.     Heart sounds: Normal heart sounds.  Pulmonary:     Effort: Pulmonary effort is normal.     Breath sounds: Normal breath sounds.  Abdominal:     General: Bowel sounds are normal.      Palpations: Abdomen is soft.     Comments: Recent surgical incision noted. No  signs of infection  Skin:    General: Skin is warm and dry.  Neurological:     Mental Status: He is alert and oriented to person, place, and time.        CMP Latest Ref Rng & Units 02/12/2019  Glucose 70 - 99 mg/dL 125(H)  BUN 8 - 23 mg/dL 11  Creatinine 0.61 - 1.24 mg/dL 0.80  Sodium 135 - 145 mmol/L 140  Potassium 3.5 - 5.1 mmol/L 3.4(L)  Chloride 98 - 111 mmol/L 109  CO2 22 - 32 mmol/L 25  Calcium 8.9 - 10.3 mg/dL 8.3(L)  Total Protein 6.5 - 8.1 g/dL -  Total Bilirubin 0.3 - 1.2 mg/dL -  Alkaline Phos 38 - 126 U/L -  AST 15 - 41 U/L -  ALT 0 - 44 U/L -   CBC Latest Ref Rng & Units 02/12/2019  WBC 4.0 - 10.5 K/uL 7.8  Hemoglobin 13.0 - 17.0 g/dL 9.3(L)  Hematocrit 39.0 - 52.0 % 29.2(L)  Platelets 150 - 400 K/uL 95(L)    @IMAGES @  Dg Chest 1 View  Result Date: 02/10/2019 CLINICAL DATA:  Chest pain EXAM: CHEST  1 VIEW COMPARISON:  10/01/2018 FINDINGS: The heart is mildly enlarged. Normal vascularity. Postoperative changes from CABG. Low lung volumes. Bibasilar atelectasis. IMPRESSION: Cardiomegaly without decompensation.  Bibasilar atelectasis. Electronically Signed   By: Marybelle Killings M.D.   On: 02/10/2019 14:57    Assessment and plan- Patient is a 83 y.o. male with iron deficiency anemia and mild thrombocytopenia  1.  Patient's baseline platelet count runs in the 100s to 110s.  It is mildly lower than his baseline in the 90s at present.  His coags are normal and I therefore do not suspect primary bleeding disorder.  He does not require any platelet transfusion at this time and his platelet count of 90 should not predispose him to increased risk of bleeding.  2.  Postop melena: He did receive 1 unit of PRBC yesterday and today and his hemoglobin has now stabilized around 9.  GI is on board who recommends conservative monitoring and possible endoscopy in the future if his hemoglobin continues to  decline.  He does have a history of small bowel AVMs as well which may be contributing to bleeding as well.  I will follow-up with him as an outpatient about 10 days from discharge and consider giving him more IV iron as an outpatient if need be   Thank you for this kind referral and the opportunity to participate in the care of this patient   Visit Diagnosis 1. Thrombocytopenia 2. Iron deficiency anemia  Dr. Randa Evens, MD, MPH [facility] [phone] 02/12/2019 9:30 PM

## 2019-02-12 NOTE — Progress Notes (Signed)
[place] at [place] NAME: Matthew [name]    MR#:  [id number]  DATE OF BIRTH:  01-13-1936  SUBJECTIVE:  CHIEF COMPLAINT:  No chief complaint on file. continues to have bloody BM. Wife at bedside REVIEW OF SYSTEMS:  Review of Systems  Constitutional: Positive for malaise/fatigue. Negative for diaphoresis, fever and weight loss.  HENT: Negative for ear discharge, ear pain, hearing loss, nosebleeds, sore throat and tinnitus.   Eyes: Negative for blurred vision and pain.  Respiratory: Negative for cough, hemoptysis, shortness of breath and wheezing.   Cardiovascular: Negative for chest pain, palpitations, orthopnea and leg swelling.  Gastrointestinal: Positive for melena. Negative for abdominal pain, blood in stool, constipation, diarrhea, heartburn, nausea and vomiting.  Genitourinary: Negative for dysuria, frequency and urgency.  Musculoskeletal: Negative for back pain and myalgias.  Skin: Negative for itching and rash.  Neurological: Negative for dizziness, tingling, tremors, focal weakness, seizures, weakness and headaches.  Psychiatric/Behavioral: Negative for depression. The patient is not nervous/anxious.     DRUG ALLERGIES:   Allergies  Allergen Reactions  . Ranexa [Ranolazine Er] Rash   VITALS:  Blood pressure (!) 140/57, pulse 69, temperature 99.6 F (37.6 C), temperature source Oral, resp. rate 15, SpO2 97 %. PHYSICAL EXAMINATION:  Physical Exam HENT:     Head: Normocephalic and atraumatic.  Eyes:     Conjunctiva/sclera: Conjunctivae normal.     Pupils: Pupils are equal, round, and reactive to light.  Neck:     Musculoskeletal: Normal range of motion and neck supple.     Thyroid: No thyromegaly.     Trachea: No tracheal deviation.  Cardiovascular:     Rate and Rhythm: Normal rate and regular rhythm.     Heart sounds: Normal heart sounds.  Pulmonary:     Effort: Pulmonary effort is normal. No respiratory distress.   Breath sounds: Normal breath sounds. No wheezing.  Chest:     Chest wall: No tenderness.  Abdominal:     General: Bowel sounds are normal. There is no distension.     Palpations: Abdomen is soft.     Tenderness: There is no abdominal tenderness.  Musculoskeletal: Normal range of motion.  Skin:    General: Skin is warm and dry.     Findings: No rash.  Neurological:     Mental Status: He is alert and oriented to person, place, and time.     Cranial Nerves: No cranial nerve deficit.    LABORATORY PANEL:  Male CBC Recent Labs  Lab 02/12/19 0004  WBC 7.8  HGB 9.3*  HCT 29.2*  PLT 95*   ------------------------------------------------------------------------------------------------------------------ Chemistries  Recent Labs  Lab 02/10/19 0343  02/12/19 0004  NA 140   < > 140  K 3.6   < > 3.4*  CL 112*   < > 109  CO2 21*   < > 25  GLUCOSE 194*   < > 125*  BUN 12   < > 11  CREATININE 0.91   < > 0.80  CALCIUM 8.5*   < > 8.3*  MG 2.0  --   --    < > = values in this interval not displayed.   RADIOLOGY:  No results found. ASSESSMENT AND PLAN:   1.  Chest pain with history of CAD - per cardio hold antiplatelt agents considering active GI bleed - symptoms managed well with nitro patch 2.  GI bleed with recent colon surgery: further mgmt per surgery. Hb 9.3 today  s/p 1 PRBC transfusion on 3/1. GI following as well. Conservative mgmt for now 3.  Chronic thrombocytopenia - platelets 90 today. monitor 4.  Hyperlipidemia unspecified - continue atorvastatin 5.  History of aortic stenosis status post valve replacement: continue coreg and entresto     All the records are reviewed and case discussed with Care Management/Social Worker. Management plans discussed with the patient, family (wife at bedside) and they are in agreement.  CODE STATUS: Full Code  TOTAL TIME TAKING CARE OF THIS PATIENT: 35 minutes.   More than 50% of the time was spent in counseling/coordination of  care: Valaria Good M.D on 02/12/2019 at 6:08 PM  Between 7am to 6pm - Pager - [phone]  After 6pm go to [email or web address] - password [email or web address] [facility]  [employer] [facility]  Office  [phone]  CC: Primary care physician; Antionette Char, MD  Note: This dictation was prepared with Dragon dictation along with smaller phrase technology. Any transcriptional errors that result from this process are unintentional.

## 2019-02-12 NOTE — Progress Notes (Signed)
MD informed of patient having a bowel movement this AM with mostly blood and clots. No change at this time

## 2019-02-12 NOTE — Progress Notes (Signed)
POD # 3 s/p right colectomy  Patient seen and examined.  He did have melena this morning but since then to normal but small bowel movements.  His vital signs are stable.  His hemoglobin was review and was the same after 1 unit He has been ambulating and taking regular diet.   PE NAD Abd: soft, minimal tenderness, no infection or peritonitis  A/P bleed after right colectomy likely from anastomosis.  Currently he is hemodynamically stable and he is not exsanguinating.  Given the thrombocytopenia and his morbidities I do think that the best option is to wait and reassess.  I have discussed with GI and they are in agreement with me they will be happy to further work this up but at this time will feel that watchful waiting is the best approach.

## 2019-02-12 NOTE — Progress Notes (Signed)
Progress Note  Patient Name: Matthew [name] Date of Encounter: 02/12/2019  Primary Cardiologist: Fletcher Anon  Subjective   No further chest pain with ambulation. Continues to note abdominal pain. HGB remains low, though stable at 9.3.   Inpatient Medications    Scheduled Meds: . sodium chloride   Intravenous Once  . acetaminophen  1,000 mg Oral Q6H  . atorvastatin  40 mg Oral q1800  . carvedilol  3.125 mg Oral BID WC  . nitroGLYCERIN  0.5 inch Topical Q6H  . pantoprazole (PROTONIX) IV  40 mg Intravenous QHS  . sacubitril-valsartan  1 tablet Oral Daily  . tamsulosin  0.4 mg Oral Daily   Continuous Infusions: . dextrose 5% lactated ringers 20 mL/hr at 02/12/19 1237   PRN Meds: hydrALAZINE, morphine injection, nitroGLYCERIN, ondansetron **OR** ondansetron (ZOFRAN) IV, oxyCODONE, prochlorperazine **OR** prochlorperazine   Vital Signs    Vitals:   02/11/19 2147 02/12/19 0426 02/12/19 0957 02/12/19 1123  BP: (!) 126/53 (!) 125/45 126/61 135/65  Pulse: 72 75 72 66  Resp: 20 20    Temp: 97.7 F (36.5 C) 97.8 F (36.6 C)  98.5 F (36.9 C)  TempSrc: Oral Oral  Oral  SpO2: 95% 96% 98% 97%    Intake/Output Summary (Last 24 hours) at 02/12/2019 1251 Last data filed at 02/12/2019 0915 Gross per 24 hour  Intake 763 ml  Output 2 ml  Net 761 ml   There were no vitals filed for this visit.  Telemetry    NSR, BBB, PVCs - Personally Reviewed  ECG    n/a - Personally Reviewed  Physical Exam   GEN: No acute distress.   Neck: No JVD. Cardiac: RRR, II/VI systolic murmur at the RUSB, no rubs, or gallops.  Respiratory: Clear to auscultation bilaterally.  GI: Soft, nontender, non-distended.   MS: No edema; No deformity. Neuro:  Alert and oriented x 3; Nonfocal.  Psych: Normal affect.  Labs    Chemistry Recent Labs  Lab 02/09/19 1542 02/10/19 0343 02/11/19 0149  NA  --  140 139  K  --  3.6 3.4*  CL  --  112* 111  CO2  --  21* 23  GLUCOSE  --  194* 132*  BUN  --  12  11  CREATININE 0.87 0.91 0.87  CALCIUM  --  8.5* 8.4*  GFRNONAA >60 >60 >60  GFRAA >60 >60 >60  ANIONGAP  --  7 5     Hematology Recent Labs  Lab 02/10/19 0343 02/10/19 1949 02/11/19 0149 02/12/19 0004  WBC 9.2  --  8.9 7.8  RBC 4.39  --  3.46* 3.36*  HGB 12.1* 9.7* 9.5* 9.3*  HCT 38.3*  --  30.1* 29.2*  MCV 87.2  --  87.0 86.9  MCH 27.6  --  27.5 27.7  MCHC 31.6  --  31.6 31.8  RDW 18.8*  --  19.6* 18.5*  PLT 102*  --  90* 95*    Cardiac Enzymes Recent Labs  Lab 02/11/19 0907 02/11/19 1230 02/11/19 1655 02/12/19 0004  TROPONINI 0.33* 0.32* 0.32* 0.27*   No results for input(s): TROPIPOC in the last 168 hours.   BNPNo results for input(s): BNP, PROBNP in the last 168 hours.   DDimer No results for input(s): DDIMER in the last 168 hours.   Radiology    Dg Chest 1 View  Result Date: 02/10/2019 IMPRESSION: Cardiomegaly without decompensation.  Bibasilar atelectasis. Electronically Signed   By: Marybelle Killings M.D.   On: 02/10/2019  14:57    Cardiac Studies   Echo 02/11/2019: 1. The left ventricle has severely reduced systolic function, with an ejection fraction of 25-30%. The cavity size was moderately dilated. Left ventricular diffuse hypokinesis. Unable to exclude regional wall motion abnormality (challenging image  quality)  2. The right ventricle has normal systolic function. The cavity was normal. There is no increase in right ventricular wall thickness. Right ventricular systolic pressure is mildly elevated with an estimated pressure of 38.0 mmHg.  3. Left atrial size was severely dilated.  4. Aortic valve regurgitation is mild to moderate. Mild stenosis of the aortic valve. Mean gradient 17 mm Hg.  LHC 05/2018: Conclusion     Prox LAD lesion is 100% stenosed.  2nd Mrg lesion is 80% stenosed.  Prox RCA lesion is 100% stenosed.  And is normal in caliber.  The graft exhibits minimal luminal irregularities.  And is normal in caliber.  The graft  exhibits severe focal disease.  LIMA due to inability to cannulate and is normal in caliber.  The graft exhibits no disease.  Origin lesion is 40% stenosed.  Ost Cx to Prox Cx lesion is 100% stenosed.  Ost Cx lesion is 30% stenosed.  Mid Cx lesion is 60% stenosed.  Origin to Prox Graft lesion is 100% stenosed.   1.  Significant underlying three-vessel coronary artery disease with patent LIMA to LAD, patent RIMA to diagonal, patent SVG to OM 3 and patent SVG to right PDA.  The SVG to OM 2 is now occluded which is a new finding.  However, OM2 gets retrograde flow from SVG to OM 3. 2.  Possible significant left subclavian artery stenosis although no gradient was noted with pullback.  Recommendations: Overall difficult procedure due to significant tortuosity of the innominate artery and left subclavian artery.  No coronary revascularization is needed.  We have to investigate the significance of left subclavian artery stenosis which might be causing decreased flow into the LIMA to LAD.  Recommend carotid Doppler with focus on the left subclavian artery.  Endovascular intervention of the left subclavian artery is not straightforward due to tortuosity and also close origin to the LIMA. The patient is noted to have frequent PVCs before and throughout the cath.  This might be contributing to his cardiomyopathy.  Recommend a 24-hour Holter monitor.     Patient Profile     83 y.o. male with history of CAD s/p CABG in 1991 with redo bypass in 2004, aortic stenosis s/p TAVR in 05/2016,LBBB, PVCs, chronic combined systolic and diastolic CHF, ICM, chronic angina, PAD with bilateral common iliac artery kissing stenting in 2015, HTN, anemia, GI bleed, OSA, and GERD who was admitted for right hemicolectomy and hernia repair. Post operative course has been complicated by GI bleed requiring transfusion and stable angina with mildly elevated troponin.  Assessment & Plan    1. CAD involving the the  native arteries as well as bypass grafts with stable angina/elevated troponin: -No further chest pain -Anginal symptoms likely exacerbated by acute blood loss anemia -Elevated troponin with a peak of 0.33 felt to be supply demand ischemia in the setting of known CAD as above with acute blood loss anemia and HFrEF -Not a candidate for heparin gtt given anemia requiring pRBC -Not on ASA given anemia -Coreg -If BP continues to be stable could add Imdur (BP has previously precluded this in the outpatient setting) -Intolerant to Ranexa  2. HFrEF secondary to ICM/LBBB: -He does not appear grossly volume up -Continue Coreg  and Entresto -Consider addition of spironolactone, however would be cautious with escalation of HF medications as his BP typically runs on the soft side which has precluded escalation of evidence based therapy  -Consider CRT with EP  3. PVCs: -May benefit from AAT per EP -Magnesium 2.0 -Potassium 3.4 on 3/1, trend with recommendation to replete to goal of 4.0 -Recent TSH normal -Coreg   4. GI bleed/acute blood loss anemia: -HGB remains low, though is stable -Trend and transfuse as needed per IM  5. Status post TAVR: -Stable valve as above  For questions or updates, please contact [facility] Please consult [email or web address] for contact info under Cardiology/STEMI.    Signed, Christell Faith, PA-C [place] [facility] Pager: [phone] 02/12/2019, 12:51 PM

## 2019-02-13 DIAGNOSIS — I5022 Chronic systolic (congestive) heart failure: Secondary | ICD-10-CM

## 2019-02-13 DIAGNOSIS — K921 Melena: Secondary | ICD-10-CM | POA: Diagnosis not present

## 2019-02-13 DIAGNOSIS — I248 Other forms of acute ischemic heart disease: Secondary | ICD-10-CM

## 2019-02-13 DIAGNOSIS — D62 Acute posthemorrhagic anemia: Secondary | ICD-10-CM

## 2019-02-13 LAB — TYPE AND SCREEN
ABO/RH(D): O NEG
Antibody Screen: NEGATIVE
UNIT DIVISION: 0
Unit division: 0
Unit division: 0

## 2019-02-13 LAB — BASIC METABOLIC PANEL
Anion gap: 7 (ref 5–15)
BUN: 11 mg/dL (ref 8–23)
CHLORIDE: 107 mmol/L (ref 98–111)
CO2: 26 mmol/L (ref 22–32)
Calcium: 8.4 mg/dL — ABNORMAL LOW (ref 8.9–10.3)
Creatinine, Ser: 0.9 mg/dL (ref 0.61–1.24)
GFR calc Af Amer: >60 mL/min (ref >60)
GFR calc non Af Amer: >60 mL/min (ref >60)
Glucose, Bld: 128 mg/dL — ABNORMAL HIGH (ref 70–99)
Potassium: 3.4 mmol/L — ABNORMAL LOW (ref 3.5–5.1)
Sodium: 140 mmol/L (ref 135–145)

## 2019-02-13 LAB — BPAM RBC
Blood Product Expiration Date: 202003032359
Blood Product Expiration Date: 202003172359
Blood Product Expiration Date: 202003172359
ISSUE DATE / TIME: 202003011856
ISSUE DATE / TIME: 202003021439
Unit Type and Rh: 5100
Unit Type and Rh: 9500
Unit Type and Rh: 9500

## 2019-02-13 LAB — CBC
HCT: 31.5 % — ABNORMAL LOW (ref 39.0–52.0)
Hemoglobin: 10.2 g/dL — ABNORMAL LOW (ref 13.0–17.0)
MCH: 27.9 pg (ref 26.0–34.0)
MCHC: 32.4 g/dL (ref 30.0–36.0)
MCV: 86.3 fL (ref 80.0–100.0)
Platelets: 96 10*3/uL — ABNORMAL LOW (ref 150–400)
RBC: 3.65 MIL/uL — ABNORMAL LOW (ref 4.22–5.81)
RDW: 18.3 % — ABNORMAL HIGH (ref 11.5–15.5)
WBC: 6.8 10*3/uL (ref 4.0–10.5)
nRBC: 0 % (ref 0.0–0.2)

## 2019-02-13 LAB — SURGICAL PATHOLOGY

## 2019-02-13 LAB — HEMOGLOBIN AND HEMATOCRIT, BLOOD
HCT: 31.1 % — ABNORMAL LOW (ref 39.0–52.0)
Hemoglobin: 10.2 g/dL — ABNORMAL LOW (ref 13.0–17.0)

## 2019-02-13 LAB — TROPONIN I: Troponin I: 0.13 ng/mL (ref <0.03)

## 2019-02-13 MED ORDER — PANTOPRAZOLE SODIUM 40 MG PO TBEC
40.0000 mg | DELAYED_RELEASE_TABLET | Freq: Every day | ORAL | Status: DC
  Administered 2019-02-13 – 2019-02-14 (×2): 40 mg via ORAL
  Filled 2019-02-13 (×2): qty 1

## 2019-02-13 NOTE — Progress Notes (Signed)
[facility] [place] NAME: Matthew [name]    MR#:  [id number]  DATE OF BIRTH:  [birth year]  SUBJECTIVE:  CHIEF COMPLAINT:  No chief complaint on file. No further bowel movement.  No melena.  Wife at bedside. REVIEW OF SYSTEMS:  Review of Systems  Constitutional: Positive for malaise/fatigue. Negative for diaphoresis, fever and weight loss.  HENT: Negative for ear discharge, ear pain, hearing loss, nosebleeds, sore throat and tinnitus.   Eyes: Negative for blurred vision and pain.  Respiratory: Negative for cough, hemoptysis, shortness of breath and wheezing.   Cardiovascular: Negative for chest pain, palpitations, orthopnea and leg swelling.  Gastrointestinal: Positive for melena. Negative for abdominal pain, blood in stool, constipation, diarrhea, heartburn, nausea and vomiting.  Genitourinary: Negative for dysuria, frequency and urgency.  Musculoskeletal: Negative for back pain and myalgias.  Skin: Negative for itching and rash.  Neurological: Negative for dizziness, tingling, tremors, focal weakness, seizures, weakness and headaches.  Psychiatric/Behavioral: Negative for depression. The patient is not nervous/anxious.    DRUG ALLERGIES:   Allergies  Allergen Reactions  . Ranexa [Ranolazine Er] Rash   VITALS:  Blood pressure (!) 141/55, pulse 63, temperature 98 F (36.7 C), temperature source Oral, resp. rate 18, height 5\' 9"  (1.753 m), weight 89.7 kg, SpO2 96 %. PHYSICAL EXAMINATION:  Physical Exam HENT:     Head: Normocephalic and atraumatic.  Eyes:     Conjunctiva/sclera: Conjunctivae normal.     Pupils: Pupils are equal, round, and reactive to light.  Neck:     Musculoskeletal: Normal range of motion and neck supple.     Thyroid: No thyromegaly.     Trachea: No tracheal deviation.  Cardiovascular:     Rate and Rhythm: Normal rate and regular rhythm.     Heart sounds: Normal heart sounds.  Pulmonary:     Effort: Pulmonary  effort is normal. No respiratory distress.     Breath sounds: Normal breath sounds. No wheezing.  Chest:     Chest wall: No tenderness.  Abdominal:     General: Bowel sounds are normal. There is no distension.     Palpations: Abdomen is soft.     Tenderness: There is no abdominal tenderness.  Musculoskeletal: Normal range of motion.  Skin:    General: Skin is warm and dry.     Findings: No rash.  Neurological:     Mental Status: He is alert and oriented to person, place, and time.     Cranial Nerves: No cranial nerve deficit.    LABORATORY PANEL:  Male CBC Recent Labs  Lab 02/13/19 0420  WBC 6.8  HGB 10.2*  HCT 31.5*  PLT 96*   ------------------------------------------------------------------------------------------------------------------ Chemistries  Recent Labs  Lab 02/10/19 0343  02/13/19 0420  NA 140   < > 140  K 3.6   < > 3.4*  CL 112*   < > 107  CO2 21*   < > 26  GLUCOSE 194*   < > 128*  BUN 12   < > 11  CREATININE 0.91   < > 0.90  CALCIUM 8.5*   < > 8.4*  MG 2.0  --   --    < > = values in this interval not displayed.   RADIOLOGY:  No results found. ASSESSMENT AND PLAN:   1.  Chest pain with history of CAD - per cardio hold antiplatelt agents considering active GI bleed - Cardiology has discontinued nitroglycerin paste today  -  Hold off on anticoagulation and antiplatelet therapy at this time.  - Restart aspirin 81 mg daily if ok with by GI/general surgery 2.  GI bleed with recent colon surgery: further mgmt per surgery. Hb 10.2 today s/p 1 PRBC transfusion on 3/1. GI following as well. Conservative mgmt for now 3.  Chronic thrombocytopenia - platelets 90 today. monitor 4.  Hyperlipidemia unspecified - continue atorvastatin 5.  History of aortic stenosis status post valve replacement: continue coreg and entresto - follow-up as an outpatient with Dr. Caryl Comes (EP) to discuss benefits of PVC suppression with antiarrhythmic therapy and/or CRT.     All  the records are reviewed and case discussed with Care Management/Social Worker. Management plans discussed with the patient, family (wife at bedside) and they are in agreement.  CODE STATUS: Full Code  TOTAL TIME TAKING CARE OF THIS PATIENT: 35 minutes.   More than 50% of the time was spent in counseling/coordination of care: Valaria Good M.D on 02/13/2019 at 2:47 PM  Between 7am to 6pm - Pager - [phone]  After 6pm go to [email or web address] - password [email or web address] [facility]  [employer] [place] [facility]  Office  [phone]  CC: Primary care physician; Antionette Char, MD  Note: This dictation was prepared with Dragon dictation along with smaller phrase technology. Any transcriptional errors that result from this process are unintentional.

## 2019-02-13 NOTE — Progress Notes (Signed)
   Vonda Antigua, MD [address], [place], [place], [place], [address] [address], [place], [place], [place], [address] Phone: [phone]  Fax: [phone]   Subjective: Patient sitting up in bed comfortably today ready to eat his soft diet.  Family at bedside.  They state that his bowel movement today consisted of small blood streaks.  However, the quantity of blood was much less than before.  Denies any dizziness or shortness of breath.   Objective: Exam: Vital signs in last 24 hours: Vitals:   02/13/19 0604 02/13/19 0950 02/13/19 1138 02/13/19 1410  BP: (!) 116/54 (!) 157/50 (!) 141/55   Pulse: 61 67 63   Resp: 18     Temp: 98.4 F (36.9 C)  98 F (36.7 C)   TempSrc: Oral  Oral   SpO2: 95%  96%   Weight:    89.7 kg  Height:    5\' 9"  (1.753 m)   Weight change:   Intake/Output Summary (Last 24 hours) at 02/13/2019 1425 Last data filed at 02/13/2019 1416 Gross per 24 hour  Intake 1397.93 ml  Output 300 ml  Net 1097.93 ml    General: No acute distress, AAO x3 Abd: Soft, NT/ND, No HSM Skin: Warm, no rashes Neck: Supple, Trachea midline   Lab Results: Lab Results  Component Value Date   WBC 6.8 02/13/2019   HGB 10.2 (L) 02/13/2019   HCT 31.5 (L) 02/13/2019   MCV 86.3 02/13/2019   PLT 96 (L) 02/13/2019   Micro Results: No results found for this or any previous visit (from the past 240 hour(s)). Studies/Results: No results found. Medications:  Scheduled Meds: . acetaminophen  1,000 mg Oral Q6H  . atorvastatin  40 mg Oral q1800  . carvedilol  3.125 mg Oral BID WC  . pantoprazole  40 mg Oral Daily  . sacubitril-valsartan  1 tablet Oral Daily  . tamsulosin  0.4 mg Oral Daily   Continuous Infusions: . dextrose 5% lactated ringers Stopped (02/12/19 1405)   PRN Meds:.hydrALAZINE, morphine injection, nitroGLYCERIN, ondansetron **OR** ondansetron (ZOFRAN) IV, oxyCODONE, prochlorperazine **OR** prochlorperazine   Assessment: Active Problems:  Colonic mass    Plan: Patient's hemoglobin is 10.2 this morning, and was 9.3 yesterday. The amount of blood streaks in his stool has been declining in quantity, consistent with resolving bleeding  Continue to monitor stool, and serial CBCs Transfuse as needed Maintain 2 large-bore IV lines  The source of of his bleed as written in Dr. Verlin Grills consult note yesterday, is likely his recent ileocolonic anastomosis  Clinical symptoms are now consistent with resolving bleed  Given his extensive cardiac history, the plan was to continue to monitor unless patient has ongoing bleeding.  No indication for endoscopy at this time given resolving symptoms, and higher risks and benefits in this setting.  However, if patient continues to have active bleeding, please page GI on-call for further evaluation  However, please note that with his recent surgery and anastomosis, endoscopy is likely to have less therapeutic benefit and will have higher risks in the setting of a very recent surgery.  And if patient has continued bleeding from the site, other avenues may need to be explored such as surgical intervention.  I am covering for Dr. Marius Ditch today and she will be back on service tomorrow and follow the patient further   LOS: 4 days   Vonda Antigua, MD 02/13/2019, 2:25 PM

## 2019-02-13 NOTE — Progress Notes (Signed)
[facility] Hospital Day(s): 4.   Post op day(s): 4 Days Post-Op.   Interval History: Patient seen and examined, no acute events or new complaints overnight. Patient reports he continues to have incisional soreness but he denied any fever, chills, nausea, or emesis. He reports one small non bloody bowel movement since yesterday. Has been tolerating a full liquid diet. Mobilizing well around the unit.   Review of Systems:  Constitutional: denies fever, chills  Gastrointestinal: + abdominal pain (incisional), denied N/V, or diarrhea/and bowel function as per interval history Integumentary: denies any other rashes or skin discolorations except surgical incisions   Vital signs in last 24 hours: [min-max] current  Temp:  [98 F (36.7 C)-99.6 F (37.6 C)] 98.4 F (36.9 C) (03/03 0604) Pulse Rate:  [61-73] 61 (03/03 0604) Resp:  [15-18] 18 (03/03 0604) BP: (116-140)/(50-65) 116/54 (03/03 0604) SpO2:  [94 %-98 %] 95 % (03/03 0604)             Intake/Output this shift:  No intake/output data recorded.    Physical Exam:  Constitutional: alert, cooperative and no distress  Respiratory: breathing non-labored at rest  Cardiovascular: regular rate and sinus rhythm  Gastrointestinal: soft, incisional tenderness, and non-distended. No rebound/guarding Integumentary: Laparoscopic and laparotomy incisions are CDI, no erythema or drainage  Labs:  CBC Latest Ref Rng & Units 02/13/2019 02/12/2019 02/11/2019  WBC 4.0 - 10.5 K/uL 6.8 7.8 8.9  Hemoglobin 13.0 - 17.0 g/dL 10.2(L) 9.3(L) 9.5(L)  Hematocrit 39.0 - 52.0 % 31.5(L) 29.2(L) 30.1(L)  Platelets 150 - 400 K/uL 96(L) 95(L) 90(L)   CMP Latest Ref Rng & Units 02/13/2019 02/12/2019 02/11/2019  Glucose 70 - 99 mg/dL 128(H) 125(H) 132(H)  BUN 8 - 23 mg/dL 11 11 11   Creatinine 0.61 - 1.24 mg/dL 0.90 0.80 0.87  Sodium 135 - 145 mmol/L 140 140 139  Potassium 3.5 - 5.1 mmol/L 3.4(L) 3.4(L) 3.4(L)  Chloride 98 - 111  mmol/L 107 109 111  CO2 22 - 32 mmol/L 26 25 23   Calcium 8.9 - 10.3 mg/dL 8.4(L) 8.3(L) 8.4(L)  Total Protein 6.5 - 8.1 g/dL - - -  Total Bilirubin 0.3 - 1.2 mg/dL - - -  Alkaline Phos 38 - 126 U/L - - -  AST 15 - 41 U/L - - -  ALT 0 - 44 U/L - - -    Imaging studies: No new pertinent imaging studies   Assessment/Plan: (ICD-10's: C18.0) 83 y.o. male with stable to improved anemia but otherwise ding well 4 Days Post-Op s/p  for right hemicolectomy and side to side ileocolostomy for right colonic mass, complicated by pertinent comorbidities including anemia, aortic stenosis, CAD s/p MI, HTN, HLD, OSA, PAD, history of thrombocytopenia, history of alcohol abuse, advanced age, and former tobacco abuse (smoking).   - Advance to soft diet, wean IVF  - Will obtained H&H this afternoon for trending  - Pain control prn (minimize narcotics)  - Continue to monitor abdominal examination, on-going bowel function  - Appreciate consulting team (GI, Cardiology) input  - Medical management of comorbidities  - Mobilization encouraged   All of the above findings and recommendations were discussed with the patient, patient's family, and the medical team, and all of patient's and family's questions were answered to their expressed satisfaction.  -- Edison Simon, PA-C [facility] 02/13/2019, 9:23 AM [phone] M-F: 7am - 4pm

## 2019-02-13 NOTE — Progress Notes (Signed)
MD made aware that pt has had 4 type 6 bowel movements that are dark red/brown. No new orders given at this time.   Matthew [name] [employer]

## 2019-02-13 NOTE — Progress Notes (Signed)
Progress Note  Patient Name: Matthew [name] Date of Encounter: 02/13/2019  Primary Cardiologist: Fletcher Anon  Subjective   Feels well this morning.  No chest pain or shortness of breath.  Has ambulated in the room without symptoms.  He did receive another unit of packed red blood cells on 3/2 with a current hemoglobin of 10.2.  Continues to note some pain along his surgical incision.  Inpatient Medications    Scheduled Meds: . acetaminophen  1,000 mg Oral Q6H  . atorvastatin  40 mg Oral q1800  . carvedilol  3.125 mg Oral BID WC  . nitroGLYCERIN  0.5 inch Topical Q6H  . pantoprazole  40 mg Oral Daily  . sacubitril-valsartan  1 tablet Oral Daily  . tamsulosin  0.4 mg Oral Daily   Continuous Infusions: . dextrose 5% lactated ringers Stopped (02/12/19 1405)   PRN Meds: hydrALAZINE, morphine injection, nitroGLYCERIN, ondansetron **OR** ondansetron (ZOFRAN) IV, oxyCODONE, prochlorperazine **OR** prochlorperazine   Vital Signs    Vitals:   02/12/19 1850 02/12/19 2125 02/13/19 0604 02/13/19 0950  BP: (!) 133/50 (!) 138/56 (!) 116/54 (!) 157/50  Pulse: 73 66 61 67  Resp: 15 18 18    Temp: 98 F (36.7 C) 98.2 F (36.8 C) 98.4 F (36.9 C)   TempSrc: Oral Oral Oral   SpO2: 94% 94% 95%     Intake/Output Summary (Last 24 hours) at 02/13/2019 1103 Last data filed at 02/13/2019 1027 Gross per 24 hour  Intake 1157.93 ml  Output 300 ml  Net 857.93 ml   There were no vitals filed for this visit.  Telemetry    NSR, BBB, PVCs- Personally Reviewed  ECG    n/a - Personally Reviewed  Physical Exam   GEN: No acute distress.   Neck: No JVD. Cardiac: RRR, 2/6 systolic murmur at the right upper sternal border, rubs, or gallops.  Respiratory: Clear to auscultation bilaterally.  GI: Soft, nontender, non-distended, well-healing surgical scar midline abdomen.   MS: No edema; No deformity. Neuro:  Alert and oriented x 3; Nonfocal.  Psych: Normal affect.  Labs    Chemistry Recent  Labs  Lab 02/11/19 0149 02/12/19 0004 02/13/19 0420  NA 139 140 140  K 3.4* 3.4* 3.4*  CL 111 109 107  CO2 23 25 26   GLUCOSE 132* 125* 128*  BUN 11 11 11   CREATININE 0.87 0.80 0.90  CALCIUM 8.4* 8.3* 8.4*  GFRNONAA >60 >60 >60  GFRAA >60 >60 >60  ANIONGAP 5 6 7      Hematology Recent Labs  Lab 02/11/19 0149 02/12/19 0004 02/13/19 0420  WBC 8.9 7.8 6.8  RBC 3.46* 3.36* 3.65*  HGB 9.5* 9.3* 10.2*  HCT 30.1* 29.2* 31.5*  MCV 87.0 86.9 86.3  MCH 27.5 27.7 27.9  MCHC 31.6 31.8 32.4  RDW 19.6* 18.5* 18.3*  PLT 90* 95* 96*    Cardiac Enzymes Recent Labs  Lab 02/11/19 1230 02/11/19 1655 02/12/19 0004 02/13/19 0634  TROPONINI 0.32* 0.32* 0.27* 0.13*   No results for input(s): TROPIPOC in the last 168 hours.   BNPNo results for input(s): BNP, PROBNP in the last 168 hours.   DDimer No results for input(s): DDIMER in the last 168 hours.   Radiology    No results found.  Cardiac Studies   Echo 02/11/2019: 1. The left ventricle has severely reduced systolic function, with an ejection fraction of 25-30%. The cavity size was moderately dilated. Left ventricular diffuse hypokinesis. Unable to exclude regional wall motion abnormality (challenging image  quality) 2. The right ventricle has normal systolic function. The cavity was normal. There is no increase in right ventricular wall thickness. Right ventricular systolic pressure is mildly elevated with an estimated pressure of 38.0 mmHg. 3. Left atrial size was severely dilated. 4. Aortic valve regurgitation is mild to moderate. Mild stenosis of the aortic valve. Mean gradient 17 mm Hg.  LHC 05/2018: Conclusion     Prox LAD lesion is 100% stenosed.  2nd Mrg lesion is 80% stenosed.  Prox RCA lesion is 100% stenosed.  And is normal in caliber.  The graft exhibits minimal luminal irregularities.  And is normal in caliber.  The graft exhibits severe focal disease.  LIMA due to inability to cannulate and is  normal in caliber.  The graft exhibits no disease.  Origin lesion is 40% stenosed.  Ost Cx to Prox Cx lesion is 100% stenosed.  Ost Cx lesion is 30% stenosed.  Mid Cx lesion is 60% stenosed.  Origin to Prox Graft lesion is 100% stenosed.  1. Significant underlying three-vessel coronary artery disease with patent LIMA to LAD, patent RIMA to diagonal, patent SVG to OM 3 and patent SVG to right PDA. The SVG to OM 2 is now occluded which is a new finding. However, OM2 gets retrograde flow from SVG to OM 3. 2. Possible significant left subclavian artery stenosis although no gradient was noted with pullback.  Recommendations: Overall difficult procedure due to significant tortuosity of the innominate artery and left subclavian artery.  No coronary revascularization is needed.  We have to investigate the significance of left subclavian artery stenosis which might be causing decreased flow into the LIMA to LAD. Recommend carotid Doppler with focus on the left subclavian artery. Endovascular intervention of the left subclavian artery is not straightforward due to tortuosity and also close origin to the LIMA. The patient is noted to have frequent PVCs before and throughout the cath. This might be contributing to his cardiomyopathy. Recommend a 24-hour Holter monitor.    Patient Profile     83 y.o. male with history of CAD s/p CABG in 1991 with redo bypass in 2004, aortic stenosis s/p TAVR in 05/2016,LBBB, PVCs, chronic combined systolic and diastolic CHF, ICM, chronic angina, PAD with bilateral common iliac artery kissing stenting in 2015, HTN,anemia,GI bleed, OSA, and GERD who was admitted for right hemicolectomy and hernia repair. Post operative course has been complicated by GI bleed requiring transfusion and stable angina with mildly elevated troponin.  Assessment & Plan    1. CAD involving the the native arteries as well as bypass grafts with stable angina/elevated troponin: -No  further chest pain -Discontinue Nitropaste, ambulate, assess for recurrent symptoms -Anginal symptoms likely exacerbated by acute blood loss anemia -Elevated troponin with a peak of 0.33 felt to be supply demand ischemia in the setting of known CAD as above with acute blood loss anemia and HFrEF -Not a candidate for heparin gtt given anemia requiring pRBC -Not on ASA given anemia, resume when able as below -Coreg -If BP continues to be stable could add Imdur (BP has previously precluded this in the outpatient setting) -Intolerant to Ranexa  2. HFrEF secondary to ICM/LBBB: -He does not appear grossly volume up -Continue Coreg and Entresto -Consider addition of spironolactone, however would be cautious with escalation of HF medications as his BP typically runs on the soft side which has precluded escalation of evidence based therapy  -Consider CRT with EP  3. PVCs: -May benefit from AAT per EP -Magnesium 2.0 -  Potassium 3.4, replete to goal of 4.0 -Recent TSH normal -Coreg   4. GI bleed/acute blood loss anemia: -HGB improved following second unit of packed red blood cell -Trend   5. Status post TAVR: -Stable valve as above -Would recommend resumption of aspirin when felt to be safely possible by surgery and hematology  For questions or updates, please contact [place] Please consult [email or web address] for contact info under Cardiology/STEMI.    Signed, Christell Faith, PA-C [place] Pager: [phone] 02/13/2019, 11:03 AM

## 2019-02-14 ENCOUNTER — Telehealth: Payer: Self-pay

## 2019-02-14 DIAGNOSIS — Z9049 Acquired absence of other specified parts of digestive tract: Secondary | ICD-10-CM

## 2019-02-14 LAB — HEMOGLOBIN AND HEMATOCRIT, BLOOD
HCT: 31.6 % — ABNORMAL LOW (ref 39.0–52.0)
Hemoglobin: 10.1 g/dL — ABNORMAL LOW (ref 13.0–17.0)

## 2019-02-14 MED ORDER — POLYETHYLENE GLYCOL 3350 17 G PO PACK
17.0000 g | PACK | Freq: Two times a day (BID) | ORAL | Status: DC
  Administered 2019-02-14 – 2019-02-15 (×2): 17 g via ORAL
  Filled 2019-02-14 (×2): qty 1

## 2019-02-14 NOTE — Telephone Encounter (Signed)
Call to daughter [name] in regards to my chart message about father.  No answer. LMOM.

## 2019-02-14 NOTE — Progress Notes (Signed)
   Vonda Antigua, MD [address], [place], [place], [place], [address] [address], [place], [place], [place], [address] Phone: [phone]  Fax: [phone]   Subjective: Patient reports having 1 small bowel movement today which was dark red and some brown.  He has been tolerating diet well.  He is ambulating well.  He wants to go home Hemoglobin is stable   Objective: Exam: Vital signs in last 24 hours: Vitals:   02/13/19 2135 02/14/19 0356 02/14/19 0801 02/14/19 1247  BP: (!) 126/45 (!) 122/47 (!) 130/53 (!) 147/46  Pulse: 65 (!) 56 (!) 55 61  Resp: 16 20  18   Temp: 98.8 F (37.1 C) 98.5 F (36.9 C)  98.1 F (36.7 C)  TempSrc: Oral Oral  Oral  SpO2: 95% 94% 93% 97%  Weight:      Height:       Weight change:   Intake/Output Summary (Last 24 hours) at 02/14/2019 1921 Last data filed at 02/14/2019 1300 Gross per 24 hour  Intake 480 ml  Output 0 ml  Net 480 ml    General: No acute distress, AAO x3 Abd: Soft, NT/ND, No HSM Skin: Warm, no rashes Neck: Supple, Trachea midline   Lab Results: Lab Results  Component Value Date   WBC 6.8 02/13/2019   HGB 10.1 (L) 02/14/2019   HCT 31.6 (L) 02/14/2019   MCV 86.3 02/13/2019   PLT 96 (L) 02/13/2019   Micro Results: No results found for this or any previous visit (from the past 240 hour(s)). Studies/Results: No results found. Medications:  Scheduled Meds: . acetaminophen  1,000 mg Oral Q6H  . atorvastatin  40 mg Oral q1800  . carvedilol  3.125 mg Oral BID WC  . pantoprazole  40 mg Oral Daily  . polyethylene glycol  17 g Oral BID  . sacubitril-valsartan  1 tablet Oral Daily  . 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  Will follow along with you    LOS: 5 days   Vonda Antigua, MD 02/14/2019, 7:21 PM

## 2019-02-14 NOTE — Progress Notes (Signed)
[facility] [place] NAME: Matthew [name]    MR#:  [id number]  DATE OF BIRTH:  1936-06-23  SUBJECTIVE:  CHIEF COMPLAINT:  No chief complaint on file. overall doing well. Hb stable. No new complaints  Wife at bedside. REVIEW OF SYSTEMS:  Review of Systems  Constitutional: Positive for malaise/fatigue. Negative for diaphoresis, fever and weight loss.  HENT: Negative for ear discharge, ear pain, hearing loss, nosebleeds, sore throat and tinnitus.   Eyes: Negative for blurred vision and pain.  Respiratory: Negative for cough, hemoptysis, shortness of breath and wheezing.   Cardiovascular: Negative for chest pain, palpitations, orthopnea and leg swelling.  Gastrointestinal: Positive for melena. Negative for abdominal pain, blood in stool, constipation, diarrhea, heartburn, nausea and vomiting.  Genitourinary: Negative for dysuria, frequency and urgency.  Musculoskeletal: Negative for back pain and myalgias.  Skin: Negative for itching and rash.  Neurological: Negative for dizziness, tingling, tremors, focal weakness, seizures, weakness and headaches.  Psychiatric/Behavioral: Negative for depression. The patient is not nervous/anxious.    DRUG ALLERGIES:   Allergies  Allergen Reactions  . Ranexa [Ranolazine Er] Rash   VITALS:  Blood pressure (!) 140/50, pulse 64, temperature 98.2 F (36.8 C), temperature source Oral, resp. rate 20, height 5\' 9"  (1.753 m), weight 89.7 kg, SpO2 95 %. PHYSICAL EXAMINATION:  Physical Exam HENT:     Head: Normocephalic and atraumatic.  Eyes:     Conjunctiva/sclera: Conjunctivae normal.     Pupils: Pupils are equal, round, and reactive to light.  Neck:     Musculoskeletal: Normal range of motion and neck supple.     Thyroid: No thyromegaly.     Trachea: No tracheal deviation.  Cardiovascular:     Rate and Rhythm: Normal rate and regular rhythm.     Heart sounds: Normal heart sounds.  Pulmonary:   Effort: Pulmonary effort is normal. No respiratory distress.     Breath sounds: Normal breath sounds. No wheezing.  Chest:     Chest wall: No tenderness.  Abdominal:     General: Bowel sounds are normal. There is no distension.     Palpations: Abdomen is soft.     Tenderness: There is no abdominal tenderness.  Musculoskeletal: Normal range of motion.  Skin:    General: Skin is warm and dry.     Findings: No rash.  Neurological:     Mental Status: He is alert and oriented to person, place, and time.     Cranial Nerves: No cranial nerve deficit.    LABORATORY PANEL:  Male CBC Recent Labs  Lab 02/13/19 0420  02/14/19 0251  WBC 6.8  --   --   HGB 10.2*   < > 10.1*  HCT 31.5*   < > 31.6*  PLT 96*  --   --    < > = values in this interval not displayed.   ------------------------------------------------------------------------------------------------------------------ Chemistries  Recent Labs  Lab 02/10/19 0343  02/13/19 0420  NA 140   < > 140  K 3.6   < > 3.4*  CL 112*   < > 107  CO2 21*   < > 26  GLUCOSE 194*   < > 128*  BUN 12   < > 11  CREATININE 0.91   < > 0.90  CALCIUM 8.5*   < > 8.4*  MG 2.0  --   --    < > = values in this interval not displayed.   RADIOLOGY:  No  results found. ASSESSMENT AND PLAN:   1.  Chest pain with history of CAD - per cardio hold antiplatelt agents considering active GI bleed - Restart aspirin 81 mg daily if ok with by GI/general surgery at DC 2.  GI bleed with recent colon surgery: further mgmt per surgery. Hb 10.1 today s/p 1 PRBC transfusion on 3/1. GI following as well. Conservative mgmt for now 3.  Chronic thrombocytopenia - platelets 90 today. monitor 4.  Hyperlipidemia unspecified - continue atorvastatin 5.  History of aortic stenosis status post valve replacement: continue coreg and entresto - follow-up as an outpatient with Dr. Caryl Comes (EP) to discuss benefits of PVC suppression with antiarrhythmic therapy and/or CRT.  D/w  primary team. Will sign off at this time. Patient is medically ok for D/C   All the records are reviewed and case discussed with Care Management/Social Worker. Management plans discussed with the patient, family (wife at bedside) and they are in agreement.  CODE STATUS: Full Code  TOTAL TIME TAKING CARE OF THIS PATIENT: 35 minutes.   More than 50% of the time was spent in counseling/coordination of care: Valaria Good M.D on 02/14/2019 at 10:42 PM  Between 7am to 6pm - Pager - [phone]  After 6pm go to [email or web address] - password [email or web address] [facility]  [facility]  Office  [phone]  CC: Primary care physician; Antionette Char, MD  Note: This dictation was prepared with Dragon dictation along with smaller phrase technology. Any transcriptional errors that result from this process are unintentional.

## 2019-02-14 NOTE — Progress Notes (Signed)
[facility] Hospital Day(s): 5.   Post op day(s): 5 Days Post-Op.   Interval History: Patient seen and examined, no acute events or new complaints overnight. Patient reports that he is feeling pretty good. No complaints of abdominal pain, nausea, or emesis. Tolerating a diet. Having BM which were red/brown yesterday but this continued to be improved. Mobilizing well.   Review of Systems:  Constitutional: denies fever, chills  Cardiovascular: denies chest pain or palpitations  Gastrointestinal: denies abdominal pain, N/V, or diarrhea/and bowel function as per interval history Integumentary: denies any other rashes or skin discolorations except surgical incision   Vital signs in last 24 hours: [min-max] current  Temp:  [98.5 F (36.9 C)-98.8 F (37.1 C)] 98.5 F (36.9 C) (03/04 0356) Pulse Rate:  [55-65] 55 (03/04 0801) Resp:  [16-20] 20 (03/04 0356) BP: (122-130)/(45-53) 130/53 (03/04 0801) SpO2:  [93 %-95 %] 93 % (03/04 0801) Weight:  [89.7 kg] 89.7 kg (03/03 1410)     Height: 5\' 9"  (175.3 cm) Weight: 89.7 kg BMI (Calculated): 29.19   Intake/Output this shift:  Total I/O In: 240 [P.O.:240] Out: -     Physical Exam:  Constitutional: alert, cooperative and no distress  Respiratory: breathing non-labored at rest  Cardiovascular: regular rate and sinus rhythm  Gastrointestinal: soft, incisional tenderness, and non-distended. No rebound/guarding Integumentary: Laparoscopic and laparotomy incisions are CDI, no erythema or drainage   Labs:  CBC Latest Ref Rng & Units 02/14/2019 02/13/2019 02/13/2019  WBC 4.0 - 10.5 K/uL - - 6.8  Hemoglobin 13.0 - 17.0 g/dL 10.1(L) 10.2(L) 10.2(L)  Hematocrit 39.0 - 52.0 % 31.6(L) 31.1(L) 31.5(L)  Platelets 150 - 400 K/uL - - 96(L)   CMP Latest Ref Rng & Units 02/13/2019 02/12/2019 02/11/2019  Glucose 70 - 99 mg/dL 128(H) 125(H) 132(H)  BUN 8 - 23 mg/dL 11 11 11   Creatinine 0.61 - 1.24 mg/dL 0.90 0.80 0.87   Sodium 135 - 145 mmol/L 140 140 139  Potassium 3.5 - 5.1 mmol/L 3.4(L) 3.4(L) 3.4(L)  Chloride 98 - 111 mmol/L 107 109 111  CO2 22 - 32 mmol/L 26 25 23   Calcium 8.9 - 10.3 mg/dL 8.4(L) 8.3(L) 8.4(L)  Total Protein 6.5 - 8.1 g/dL - - -  Total Bilirubin 0.3 - 1.2 mg/dL - - -  Alkaline Phos 38 - 126 U/L - - -  AST 15 - 41 U/L - - -  ALT 0 - 44 U/L - - -     Imaging studies: No new pertinent imaging studies   Assessment/Plan: (ICD-10's: C18.0) 83 y.o. male with stable anemia but otherwise ding well 5 Days Post-Op s/p  for right hemicolectomy and side to side ileocolostomy for right colonic mass, complicated by pertinent comorbidities including anemia, aortic stenosis, CAD s/p MI, HTN, HLD, OSA, PAD, history of thrombocytopenia, history of alcohol abuse, advanced age, and former tobacco abuse (smoking).   - Continue soft diet             - Will re-check H&H in the AM             - Pain control prn (minimize narcotics)             - Continue to monitor abdominal examination, on-going bowel function             - Appreciate consulting team (GI, Cardiology) input             - Medical management of comorbidities, medicine signed off (medically  clear for discharge)             - Mobilization encouraged    - Discharge planning: Likely home in the morning  All of the above findings and recommendations were discussed with the patient, patient's family, and the medical team, and all of patient's and family's questions were answered to their expressed satisfaction.  -- Edison Simon, PA-C [facility] 02/14/2019, 12:39 PM [phone] M-F: 7am - 4pm

## 2019-02-15 ENCOUNTER — Encounter: Payer: Self-pay | Admitting: Oncology

## 2019-02-15 LAB — CBC
HCT: 30.9 % — ABNORMAL LOW (ref 39.0–52.0)
HEMOGLOBIN: 9.7 g/dL — AB (ref 13.0–17.0)
MCH: 28.2 pg (ref 26.0–34.0)
MCHC: 31.4 g/dL (ref 30.0–36.0)
MCV: 89.8 fL (ref 80.0–100.0)
Platelets: 113 10*3/uL — ABNORMAL LOW (ref 150–400)
RBC: 3.44 MIL/uL — ABNORMAL LOW (ref 4.22–5.81)
RDW: 18.7 % — ABNORMAL HIGH (ref 11.5–15.5)
WBC: 8.5 10*3/uL (ref 4.0–10.5)
nRBC: 0 % (ref 0.0–0.2)

## 2019-02-15 MED ORDER — OXYCODONE HCL 5 MG PO TABS: 5.0000 mg | ORAL_TABLET | Freq: Four times a day (QID) | ORAL | 0 refills | Status: DC | PRN

## 2019-02-15 MED ORDER — DOCUSATE SODIUM 100 MG PO CAPS
100.0000 mg | ORAL_CAPSULE | Freq: Every day | ORAL | Status: DC | PRN
  Administered 2019-02-15: 100 mg via ORAL
  Filled 2019-02-15: qty 1

## 2019-02-15 NOTE — Telephone Encounter (Signed)
Per [place], it looks like Christell Faith, PA responded to patient message directly.   No further needs at this time.   Encounter completed.

## 2019-02-15 NOTE — Care Management Important Message (Signed)
Copy of signed Medicare IM left with patient in room. 

## 2019-02-15 NOTE — Progress Notes (Signed)
Pt discharged per MD order. IV removed. Discharge instructions reviewed with pt. Pt verbalized understanding of instructions. All questions answered to pt satisfaction. Pt taken to car in wheelchair by staff.

## 2019-02-15 NOTE — Discharge Summary (Signed)
[facility] SURGICAL DISCHARGE SUMMARY  Patient ID: Matthew [name] MRN: [id number] DOB/AGE: 12/25/1935 83 y.o.  Admit date: 02/09/2019 Discharge date: 02/15/2019  Discharge Diagnoses Colonic Mass  Consultants Medicine Gastroenterology Cardiology  Procedures 02/09/2019:  1. Attempted hand assisted laparoscopic right hemicolectomy 2. Open right hemicolectomy with side ileocolostomy   HPI: Matthew [name] is a 83 y.o. male with a history of right colonic mass who presents to [facility] on 02/28 for right hemicolectomy with Dr Dahlia Byes.   Hospital Course: Informed consent was obtained and documented, and patient underwent uneventful right hemicolectomy with side ileocolostomy (Dr Dahlia Byes, 02/09/2019).  Post-operatively, the patient had an episode of chest pain with exertion in which medicine was consulted. He did have elevated troponin and cardiology was consulted and recommended transfusions as needed and to trend troponin which trended down. He was also found to have bloody bowel movements in the post operative period and GI was consulted. This was felt to bo be from bleeding anastomosis rather than new GI bleed or related to his history of AVM. In all, he received 2 units of pRBCs during this admission and his Hgb had stabilized. Advancement of patient's diet and ambulation were well-tolerated. The remainder of patient's hospital course was essentially unremarkable, and discharge planning was initiated accordingly with patient safely able to be discharged home with appropriate discharge instructions, pain control, and outpatient follow-up after all of his and his family's questions were answered to his expressed satisfaction.  Discharge Condition: Good   Physical Examination:  Constitutional: alert, cooperative and no distress  Respiratory: breathing non-labored at rest  Cardiovascular: regular rate and sinus rhythm  Gastrointestinal: soft,incisional tenderness, and  non-distended. No rebound/guarding Integumentary:Laparoscopic and laparotomy incisions are CDI, no erythema or drainage   Allergies as of 02/15/2019      Reactions   Ranexa [ranolazine Er] Rash      Medication List    TAKE these medications   acetaminophen 325 MG tablet Commonly known as:  TYLENOL Take 650 mg by mouth daily as needed for mild pain or headache.   aspirin EC 81 MG tablet Take 1 tablet (81 mg total) by mouth daily.   atorvastatin 80 MG tablet Commonly known as:  LIPITOR Take 1 tablet (80 mg total) by mouth daily.   bisacodyl 5 MG EC tablet Commonly known as:  DULCOLAX Take 4 tablets (5 mg each) at 8 am the day before surgery.   carvedilol 3.125 MG tablet Commonly known as:  COREG TAKE 1 TABLET TWICE DAILY What changed:  when to take this   Erythromycin 500 MG Tbec Take 500 mg by mouth as directed. Take two tablets at 8 am, two tablets at 2 pm, and two tablets at 8 pm the day before surgery.   FISH OIL PO Take 1 capsule by mouth daily.   GARLIC PO Take 1 capsule by mouth daily.   neomycin 500 MG tablet Commonly known as:  MYCIFRADIN Take two tablets at 8 am, two tablets at 2 pm, and two tablets at 8 pm the day before surgery.   nitroGLYCERIN 0.4 MG SL tablet Commonly known as:  NITROSTAT Place 0.4 mg under the tongue every 5 (five) minutes as needed for chest pain.   oxyCODONE 5 MG immediate release tablet Commonly known as:  Oxy IR/ROXICODONE Take 1-2 tablets (5-10 mg total) by mouth every 6 (six) hours as needed for severe pain or breakthrough pain.   pantoprazole 40 MG tablet Commonly known as:  PROTONIX Take 1 tablet (  40 mg total) by mouth daily.   polyethylene glycol powder powder Commonly known as:  GLYCOLAX/MIRALAX 255 grams one bottle for bowel prep   PRESERVISION AREDS PO Take 1 tablet by mouth daily.   sacubitril-valsartan 24-26 MG Commonly known as:  ENTRESTO Take 1 tablet by mouth 2 (two) times daily. What changed:  Another  medication with the same name was removed. Continue taking this medication, and follow the directions you see here.   tamsulosin 0.4 MG Caps capsule Commonly known as:  FLOMAX Take 1 capsule (0.4 mg total) by mouth daily.   traMADol 50 MG tablet Commonly known as:  ULTRAM Take 1 tablet (50 mg total) daily by mouth.   TURMERIC PO Take 1 capsule by mouth at bedtime.        Follow-up Information    [place], [place] F, MD. Schedule an appointment as soon as possible for a visit in 1 week(s).   Specialty:  General Surgery Why:  s/p right colectomy  Contact information: [address] [place] [address] [phone]            -- Zachary Schulz , PA-C [facility]  02/15/2019, 12:25 PM [phone] M-F: 7am - 4pm

## 2019-02-16 ENCOUNTER — Other Ambulatory Visit: Payer: Self-pay | Admitting: *Deleted

## 2019-02-16 ENCOUNTER — Telehealth: Payer: Self-pay | Admitting: *Deleted

## 2019-02-16 DIAGNOSIS — D500 Iron deficiency anemia secondary to blood loss (chronic): Secondary | ICD-10-CM

## 2019-02-16 NOTE — Telephone Encounter (Signed)
Patients daughter called and stated that when Matthew [name] was in the hospital he was taking erythromycin, but when he was discharged they didn't know if he needs to continue taking it or not. Also patient was prescribed oxycodone but the patient refuses to take it due to be a acholic in the past and he doesn't want to get addicted to anything. Please call and advise

## 2019-02-16 NOTE — Telephone Encounter (Signed)
Called patients daughter and is aware that Mr Munn needs to continue taking erythromycin and to take tylenol for pain as needed.

## 2019-02-19 ENCOUNTER — Encounter: Payer: Self-pay | Admitting: Oncology

## 2019-02-19 ENCOUNTER — Inpatient Hospital Stay: Payer: Medicare HMO | Attending: Oncology | Admitting: *Deleted

## 2019-02-19 DIAGNOSIS — D509 Iron deficiency anemia, unspecified: Secondary | ICD-10-CM | POA: Diagnosis present

## 2019-02-19 DIAGNOSIS — E538 Deficiency of other specified B group vitamins: Secondary | ICD-10-CM | POA: Diagnosis present

## 2019-02-19 DIAGNOSIS — D500 Iron deficiency anemia secondary to blood loss (chronic): Secondary | ICD-10-CM

## 2019-02-19 LAB — CBC
HCT: 32.2 % — ABNORMAL LOW (ref 39.0–52.0)
Hemoglobin: 10.4 g/dL — ABNORMAL LOW (ref 13.0–17.0)
MCH: 28.7 pg (ref 26.0–34.0)
MCHC: 32.3 g/dL (ref 30.0–36.0)
MCV: 89 fL (ref 80.0–100.0)
NRBC: 0 % (ref 0.0–0.2)
Platelets: 168 10*3/uL (ref 150–400)
RBC: 3.62 MIL/uL — ABNORMAL LOW (ref 4.22–5.81)
RDW: 18 % — AB (ref 11.5–15.5)
WBC: 7.6 10*3/uL (ref 4.0–10.5)

## 2019-02-19 LAB — IRON AND TIBC
Iron: 25 ug/dL — ABNORMAL LOW (ref 45–182)
Saturation Ratios: 10 % — ABNORMAL LOW (ref 17.9–39.5)
TIBC: 263 ug/dL (ref 250–450)
UIBC: 238 ug/dL

## 2019-02-19 LAB — FERRITIN: Ferritin: 55 ng/mL (ref 24–336)

## 2019-02-19 NOTE — Telephone Encounter (Signed)
He can come today and get his labs

## 2019-02-21 ENCOUNTER — Encounter: Payer: Medicare HMO | Admitting: Surgery

## 2019-02-21 ENCOUNTER — Inpatient Hospital Stay: Payer: Medicare HMO

## 2019-02-22 NOTE — Progress Notes (Signed)
I sent a message on my chart to the daughter and changed it to 1 month.

## 2019-02-23 ENCOUNTER — Other Ambulatory Visit: Payer: Self-pay

## 2019-02-23 ENCOUNTER — Ambulatory Visit: Payer: Self-pay | Admitting: Oncology

## 2019-02-26 ENCOUNTER — Encounter: Payer: Self-pay | Admitting: Surgery

## 2019-02-26 ENCOUNTER — Other Ambulatory Visit: Payer: Self-pay

## 2019-02-26 ENCOUNTER — Ambulatory Visit (INDEPENDENT_AMBULATORY_CARE_PROVIDER_SITE_OTHER): Payer: Medicare HMO | Admitting: Surgery

## 2019-02-26 VITALS — BP 159/69 | HR 65 | Temp 97.7°F | Ht 69.0 in | Wt 190.0 lb

## 2019-02-26 DIAGNOSIS — Z09 Encounter for follow-up examination after completed treatment for conditions other than malignant neoplasm: Secondary | ICD-10-CM

## 2019-02-26 NOTE — Patient Instructions (Signed)
Return as needed.The patient is aware to call back for any questions or concerns.  

## 2019-02-27 ENCOUNTER — Encounter: Payer: Self-pay | Admitting: Surgery

## 2019-02-27 NOTE — Progress Notes (Signed)
S/P right colectomy 2/28  Doing well No complaints Having BM, no blood Ambulating and taking good po Path d/w pt and family  PE NAD, alert Abd: soft, staples removed, no infection, no peritonitis Mild appropriate incisional pain  A/P Doing well No complications  They are appreciative

## 2019-03-01 ENCOUNTER — Ambulatory Visit: Payer: Self-pay | Admitting: Oncology

## 2019-03-01 ENCOUNTER — Other Ambulatory Visit: Payer: Self-pay

## 2019-03-02 ENCOUNTER — Ambulatory Visit: Payer: Self-pay | Admitting: Cardiovascular Disease

## 2019-03-02 ENCOUNTER — Ambulatory Visit: Payer: Self-pay | Admitting: Oncology

## 2019-03-02 ENCOUNTER — Other Ambulatory Visit: Payer: Self-pay

## 2019-03-05 ENCOUNTER — Ambulatory Visit: Payer: Self-pay | Admitting: Gastroenterology

## 2019-03-16 ENCOUNTER — Ambulatory Visit: Payer: Self-pay | Admitting: Oncology

## 2019-03-16 ENCOUNTER — Other Ambulatory Visit: Payer: Self-pay

## 2019-03-19 ENCOUNTER — Other Ambulatory Visit: Payer: Self-pay

## 2019-03-20 ENCOUNTER — Inpatient Hospital Stay: Payer: Medicare HMO | Attending: Oncology

## 2019-03-20 ENCOUNTER — Other Ambulatory Visit: Payer: Self-pay

## 2019-03-20 ENCOUNTER — Ambulatory Visit: Payer: Self-pay | Admitting: Oncology

## 2019-03-20 DIAGNOSIS — D509 Iron deficiency anemia, unspecified: Secondary | ICD-10-CM | POA: Insufficient documentation

## 2019-03-20 DIAGNOSIS — D500 Iron deficiency anemia secondary to blood loss (chronic): Secondary | ICD-10-CM

## 2019-03-20 LAB — CBC WITH DIFFERENTIAL/PLATELET
Abs Immature Granulocytes: 0.06 10*3/uL (ref 0.00–0.07)
Basophils Absolute: 0.1 10*3/uL (ref 0.0–0.1)
Basophils Relative: 1 %
Eosinophils Absolute: 0.2 10*3/uL (ref 0.0–0.5)
Eosinophils Relative: 4 %
HCT: 34.8 % — ABNORMAL LOW (ref 39.0–52.0)
Hemoglobin: 10.9 g/dL — ABNORMAL LOW (ref 13.0–17.0)
Immature Granulocytes: 1 %
Lymphocytes Relative: 21 %
Lymphs Abs: 1 10*3/uL (ref 0.7–4.0)
MCH: 27.3 pg (ref 26.0–34.0)
MCHC: 31.3 g/dL (ref 30.0–36.0)
MCV: 87.2 fL (ref 80.0–100.0)
Monocytes Absolute: 0.5 10*3/uL (ref 0.1–1.0)
Monocytes Relative: 10 %
Neutro Abs: 3 10*3/uL (ref 1.7–7.7)
Neutrophils Relative %: 63 %
Platelets: 109 10*3/uL — ABNORMAL LOW (ref 150–400)
RBC: 3.99 MIL/uL — ABNORMAL LOW (ref 4.22–5.81)
RDW: 15.3 % (ref 11.5–15.5)
WBC: 4.8 10*3/uL (ref 4.0–10.5)
nRBC: 0 % (ref 0.0–0.2)

## 2019-03-20 LAB — IRON AND TIBC
Iron: 26 ug/dL — ABNORMAL LOW (ref 45–182)
Saturation Ratios: 7 % — ABNORMAL LOW (ref 17.9–39.5)
TIBC: 352 ug/dL (ref 250–450)
UIBC: 326 ug/dL

## 2019-03-20 LAB — FERRITIN: Ferritin: 13 ng/mL — ABNORMAL LOW (ref 24–336)

## 2019-03-20 LAB — VITAMIN B12: Vitamin B-12: 1263 pg/mL — ABNORMAL HIGH (ref 180–914)

## 2019-03-21 ENCOUNTER — Other Ambulatory Visit: Payer: Self-pay

## 2019-03-22 ENCOUNTER — Encounter: Payer: Self-pay | Admitting: Oncology

## 2019-03-22 ENCOUNTER — Inpatient Hospital Stay (HOSPITAL_BASED_OUTPATIENT_CLINIC_OR_DEPARTMENT_OTHER): Payer: Medicare HMO | Admitting: Oncology

## 2019-03-22 DIAGNOSIS — D509 Iron deficiency anemia, unspecified: Secondary | ICD-10-CM | POA: Diagnosis not present

## 2019-03-23 ENCOUNTER — Other Ambulatory Visit: Payer: Self-pay | Admitting: Cardiovascular Disease

## 2019-03-23 NOTE — Progress Notes (Signed)
I connected with Matthew [name] on 03/23/19 at  2:15 PM EDT by telephone visit and verified that I am speaking with the correct person using two identifiers.   I discussed the limitations, risks, security and privacy concerns of performing an evaluation and management service by telemedicine and the availability of in-person appointments. I also discussed with the patient that there may be a patient responsible charge related to this service. The patient expressed understanding and agreed to proceed.  Other persons participating in the visit and their role in the encounter:  Daughter. She needs to convey the plan to the patient and is aware of her medications  Patient's location:  home Provider's location:  home  Chief Complaint: Routine follow-up of anemia  History of present illness: patient is a 83 year old male with past medical history significant for severe aortic stenosis, mild thrombocytopenia among other medical problems. He was recently admitted to the hospital for acute symptomatic anemia from upper GI bleed when his hemoglobin dropped down from 10.6-7. Labs indicated severe iron deficiency. He underwent EGD and colonoscopy. Upper endoscopy showed several gastric AVMs which were treated with APC. 2 cm hiatal hernia was also noted. Colonoscopy showed a large flat polyp in the cecum that was not resected due to poor prep. Patient has seen Dr. Marius Ditch who plans to do a repeat colonoscopy in January 2020. Patient was also found to have low B12 levels of 128. Patient received 2 units of PRBC as well as IV iron in the hospital. He was subsequently discharged on oral B12 and oral iron 3 times a day which he is taking daily and tolerating it well without any significant side effects.  Interval history he feels overall since his colon surgery.  Denies any blood in his stool or urine.  Denies any dark melanotic stools.  Energy levels are better when he was out chopping wood before I called  him.   Review of Systems  Constitutional: Positive for malaise/fatigue. Negative for chills, fever and weight loss.  HENT: Negative for congestion, ear discharge and nosebleeds.   Eyes: Negative for blurred vision.  Respiratory: Negative for cough, hemoptysis, sputum production, shortness of breath and wheezing.   Cardiovascular: Negative for chest pain, palpitations, orthopnea and claudication.  Gastrointestinal: Negative for abdominal pain, blood in stool, constipation, diarrhea, heartburn, melena, nausea and vomiting.  Genitourinary: Negative for dysuria, flank pain, frequency, hematuria and urgency.  Musculoskeletal: Negative for back pain, joint pain and myalgias.  Skin: Negative for rash.  Neurological: Negative for dizziness, tingling, focal weakness, seizures, weakness and headaches.  Endo/Heme/Allergies: Does not bruise/bleed easily.  Psychiatric/Behavioral: Negative for depression and suicidal ideas. The patient does not have insomnia.     Allergies  Allergen Reactions  . Ranexa [Ranolazine Er] Rash    Past Medical History:  Diagnosis Date  . Alcohol abuse    stopped drinking 42 years ago from today 2020  . Anemia   . AS (aortic stenosis)    a. Echo 6/10: EF 55% mild AS; b. echo 06/2015; EF 55-60%, GR1DD, moderate AS, Peak velocity (S): 346 cm/s. Mean gradientS): 28 mm Hg. Peak gradient (S): 48 mm Hg. Valve area (VTI): 1.18 cm2;   c. Echo 4/17 - mild LVH, EF 55-60%, no RWMA, Gr 1 DD, mod to severe AS (mean 28 mmHg, peak 46 mmHg), mild LAE  . CAD (coronary artery disease)    a. MI 1996 w/ CABG 1996; b.redo CABG 06/2003; c. Myoview 7/09: EF 54% inferobasal infarct, no ischemia. Myoview 6/10  EF 43% inf wall infarct. no ischemia; c. cath 8/16 s/p DES to VG-OM2. OTW 3VD w/ patent VG->RPDA, VG->OM3,  & LIMA->LAD.  EF 55-65%.  . Carotid bruit    2009 0-39% on dopplers bilatrally  . Chest pain   . GERD (gastroesophageal reflux disease)   . History of kidney stones   . HOH (hard  of hearing)   . HTN (hypertension)   . Hyperlipidemia   . Myocardial infarction ([place]) 1996  . Nephrolithiasis   . OSA (obstructive sleep apnea)   . PAD (peripheral artery disease) ([place]) 02/2014   Subtotal occlusion of right common iliac artery and 70% stenosis in the left common iliac artery. Status post bilateral kissing stent placement. 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 Shortness of breath    with exertion  . Thrombocytopenia ([place])     Past Surgical History:  Procedure Laterality Date  . ABDOMINAL AORTAGRAM N/A 03/06/2014   Procedure: ABDOMINAL Maxcine Ham;  Surgeon: Wellington Hampshire, MD;  Location: [place] CATH LAB;  Service: Cardiovascular;  Laterality: N/A;  . CARDIAC CATHETERIZATION  02/2014   Severe three-vessel coronary artery disease with patent grafts.   Marland Kitchen CARDIAC CATHETERIZATION N/A 07/31/2015   Procedure: Right/Left Heart Cath and Coronary/Graft Angiography;  Surgeon: Wellington Hampshire, MD;  Location: [place] CV LAB;  Service: Cardiovascular;  Laterality: N/A;  . CARDIAC CATHETERIZATION N/A 07/31/2015   Procedure: Coronary Stent Intervention;  Surgeon: Wellington Hampshire, MD;  Location: [place] CV LAB;  Service: Cardiovascular;  Laterality: N/A;  . CARDIAC CATHETERIZATION N/A 05/26/2016   Procedure: Right/Left Heart Cath and Coronary Angiography;  Surgeon: Wellington Hampshire, MD;  Location: [place] CV LAB;  Service: Cardiovascular;  Laterality: N/A;  . COLONOSCOPY WITH PROPOFOL N/A 10/07/2018   Procedure: COLONOSCOPY WITH PROPOFOL;  Surgeon: Lin Landsman, MD;  Location: [facility] ENDOSCOPY;  Service: Gastroenterology;  Laterality: N/A;  . COLONOSCOPY WITH PROPOFOL N/A 01/08/2019   Procedure: COLONOSCOPY WITH PROPOFOL;  Surgeon: Lin Landsman, MD;  Location: [facility] ENDOSCOPY;  Service:  Gastroenterology;  Laterality: N/A;  . CORONARY ARTERY BYPASS GRAFT  1991   at [facility]. Redone 2004-3 vessels 1st time and 4 second time  . CYSTO     2/3 times for kidney stones  . ENTEROSCOPY N/A 12/25/2018   Procedure: ENTEROSCOPY Single balloon;  Surgeon: Lin Landsman, MD;  Location: [place];  Service: Gastroenterology;  Laterality: N/A;  . ESOPHAGOGASTRODUODENOSCOPY (EGD) WITH PROPOFOL N/A 10/07/2018   Procedure: ESOPHAGOGASTRODUODENOSCOPY (EGD) WITH PROPOFOL;  Surgeon: Lin Landsman, MD;  Location: [facility] ENDOSCOPY;  Service: Gastroenterology;  Laterality: N/A;  . GIVENS CAPSULE STUDY N/A 12/22/2018   Procedure: GIVENS CAPSULE STUDY;  Surgeon: Lin Landsman, MD;  Location: [facility] ENDOSCOPY;  Service: Gastroenterology;  Laterality: N/A;  . INGUINAL HERNIA REPAIR  11/10/2011   Procedure: HERNIA REPAIR INGUINAL ADULT;  Surgeon: Donato Heinz;  Location: [facility];  Service: General;  Laterality: Right;  . LAPAROSCOPIC RIGHT COLECTOMY Right 02/09/2019   Procedure: LAPAROSCOPIC RIGHT COLECTOMY;  Surgeon: Jules Husbands, MD;  Location: [facility] ORS;  Service: General;  Laterality: Right;  . LEFT HEART CATH AND CORONARY ANGIOGRAPHY Left 05/15/2018   Procedure: LEFT HEART CATH AND CORONARY ANGIOGRAPHY;  Surgeon: Wellington Hampshire, MD;  Location: [place] CV LAB;  Service: Cardiovascular;  Laterality: Left;  . LEFT HEART CATHETERIZATION WITH CORONARY/GRAFT  ANGIOGRAM N/A 03/06/2014   Procedure: LEFT HEART CATHETERIZATION WITH Beatrix Fetters;  Surgeon: Wellington Hampshire, MD;  Location: [facility] CATH LAB;  Service: Cardiovascular;  Laterality: N/A;  . STOMACH SURGERY     removal of gastric ulcers  . TEE WITHOUT CARDIOVERSION N/A 06/01/2016   Procedure: TRANSESOPHAGEAL ECHOCARDIOGRAM (TEE);  Surgeon: Sherren Mocha, MD;  Location: [place];  Service: Open Heart Surgery;  Laterality: N/A;  . TRANSCATHETER AORTIC VALVE REPLACEMENT, TRANSFEMORAL N/A 06/01/2016   Procedure: TRANSCATHETER  AORTIC VALVE REPLACEMENT, TRANSFEMORAL;  Surgeon: Sherren Mocha, MD;  Location: [place];  Service: Open Heart Surgery;  Laterality: N/A;    Social History   Socioeconomic History  . Marital status: Married    Spouse name: Not on file  . Number of children: 2  . Years of education: Not on file  . Highest education level: Not on file  Occupational History  . Occupation: Retired  Scientific laboratory technician  . Financial resource strain: Not on file  . Food insecurity:    Worry: Not on file    Inability: Not on file  . Transportation needs:    Medical: Not on file    Non-medical: Not on file  Tobacco Use  . Smoking status: Former Smoker    Packs/day: 1.00    Years: 50.00    Pack years: 50.00    Types: Cigarettes    Last attempt to quit: 01/24/1990    Years since quitting: 29.1  . Smokeless tobacco: Never Used  Substance and Sexual Activity  . Alcohol use: No    Alcohol/week: 0.0 standard drinks    Comment: Alcoholic in past  . Drug use: No  . Sexual activity: Never  Lifestyle  . Physical activity:    Days per week: Not on file    Minutes per session: Not on file  . Stress: Not on file  Relationships  . Social connections:    Talks on phone: Not on file    Gets together: Not on file    Attends religious service: Not on file    Active member of club or organization: Not on file    Attends meetings of clubs or organizations: Not on file    Relationship status: Not on file  . Intimate partner violence:    Fear of current or ex partner: Not on file    Emotionally abused: Not on file    Physically abused: Not on file    Forced sexual activity: Not on file  Other Topics Concern  . Not on file  Social History Narrative   Married an lives with wife in [place]. Retired form [employer].     Family History  Problem Relation Age of Onset  . Heart disease Father   . Heart disease Mother   . Colon cancer Neg Hx      Current Outpatient Medications:  .  acetaminophen  (TYLENOL) 325 MG tablet, Take 650 mg by mouth daily as needed for mild pain or headache. , Disp: , Rfl:  .  aspirin EC 81 MG tablet, Take 1 tablet (81 mg total) by mouth daily., Disp: 90 tablet, Rfl: 3 .  carvedilol (COREG) 3.125 MG tablet, TAKE 1 TABLET TWICE DAILY (Patient taking differently: Take 3.125 mg by mouth 2 (two) times daily with a meal. ), Disp: 180 tablet, Rfl: 1 .  GARLIC PO, Take 1 capsule by mouth daily. , Disp: , Rfl:  .  Multiple Vitamins-Minerals (PRESERVISION AREDS PO), Take 1 tablet by mouth daily. , Disp: ,  Rfl:  .  nitroGLYCERIN (NITROSTAT) 0.4 MG SL tablet, Place 0.4 mg under the tongue every 5 (five) minutes as needed for chest pain., Disp: , Rfl:  .  Omega-3 Fatty Acids (FISH OIL PO), Take 1 capsule by mouth daily. , Disp: , Rfl:  .  pantoprazole (PROTONIX) 40 MG tablet, Take 1 tablet (40 mg total) by mouth daily., Disp: 90 tablet, Rfl: 3 .  sacubitril-valsartan (ENTRESTO) 24-26 MG, Take 1 tablet by mouth 2 (two) times daily., Disp: 180 tablet, Rfl: 3 .  tamsulosin (FLOMAX) 0.4 MG CAPS capsule, Take 1 capsule (0.4 mg total) by mouth daily., Disp: 90 capsule, Rfl: 3 .  TURMERIC PO, Take 1 capsule by mouth at bedtime. , Disp: , Rfl:  .  atorvastatin (LIPITOR) 80 MG tablet, TAKE 1 TABLET (80 MG TOTAL) BY MOUTH DAILY., Disp: 90 tablet, Rfl: 0 .  traMADol (ULTRAM) 50 MG tablet, Take 1 tablet (50 mg total) daily by mouth. (Patient not taking: Reported on 03/22/2019), Disp: 90 tablet, Rfl: 0  No results found.  No images are attached to the encounter.   CMP Latest Ref Rng & Units 02/13/2019  Glucose 70 - 99 mg/dL 128(H)  BUN 8 - 23 mg/dL 11  Creatinine 0.61 - 1.24 mg/dL 0.90  Sodium 135 - 145 mmol/L 140  Potassium 3.5 - 5.1 mmol/L 3.4(L)  Chloride 98 - 111 mmol/L 107  CO2 22 - 32 mmol/L 26  Calcium 8.9 - 10.3 mg/dL 8.4(L)  Total Protein 6.5 - 8.1 g/dL -  Total Bilirubin 0.3 - 1.2 mg/dL -  Alkaline Phos 38 - 126 U/L -  AST 15 - 41 U/L -  ALT 0 - 44 U/L -   CBC Latest  Ref Rng & Units 03/20/2019  WBC 4.0 - 10.5 K/uL 4.8  Hemoglobin 13.0 - 17.0 g/dL 10.9(L)  Hematocrit 39.0 - 52.0 % 34.8(L)  Platelets 150 - 400 K/uL 109(L)     Assessment and plan: With iron deficiency anemia possibly secondary to upper GI bleeding  Patient received 2 doses of Feraheme in January 2020.  Currently his hemoglobin is stable around 10.9.  His ferritin levels are low at 13.  However due to the ongoing COVID pandemic, I would like to hold off on him coming to the cancer center for IV iron infusions given his age which carries a risk of acquiring covid infection.  I have therefore asked him to take oral iron 325 mg twice a day.  Follow-up instructions:  I discussed the assessment and treatment plan with the patient. The patient was provided an opportunity to ask questions and all were answered. The patient agreed with the plan and demonstrated an understanding of the instructions.   The patient was advised to call back or seek an in-person evaluation if the symptoms worsen or if the condition fails to improve as anticipated.  I provided 12 minutes of non face-to-face telephone visit time during this encounter, and > 50% was spent counseling as documented under my assessment & plan.  Visit Diagnosis: 1. Iron deficiency anemia, unspecified iron deficiency anemia type     Dr. Randa Evens, MD, MPH [facility] [phone] 03/23/2019 3:06 PM

## 2019-04-02 ENCOUNTER — Telehealth: Payer: Self-pay

## 2019-04-02 NOTE — Telephone Encounter (Signed)
LMOM Daughter's cell number is primary number, Left message to change appointment 04/12/19 at 10:00 to an E-visit if they are agreeable.

## 2019-04-04 ENCOUNTER — Telehealth (INDEPENDENT_AMBULATORY_CARE_PROVIDER_SITE_OTHER): Payer: Medicare HMO | Admitting: Internal Medicine

## 2019-04-04 VITALS — BP 170/56 | HR 60 | Ht 69.0 in | Wt 190.0 lb

## 2019-04-04 DIAGNOSIS — I5022 Chronic systolic (congestive) heart failure: Secondary | ICD-10-CM

## 2019-04-04 DIAGNOSIS — I359 Nonrheumatic aortic valve disorder, unspecified: Secondary | ICD-10-CM

## 2019-04-04 DIAGNOSIS — Z952 Presence of prosthetic heart valve: Secondary | ICD-10-CM

## 2019-04-04 DIAGNOSIS — I447 Left bundle-branch block, unspecified: Secondary | ICD-10-CM

## 2019-04-04 DIAGNOSIS — I255 Ischemic cardiomyopathy: Secondary | ICD-10-CM

## 2019-04-04 NOTE — Addendum Note (Signed)
Addended by: Dollene Primrose on: 04/04/2019 04:54 PM   Modules accepted: Orders

## 2019-04-04 NOTE — Progress Notes (Signed)
Electrophysiology TeleHealth Note   Due to national recommendations of social distancing due to COVID 19, an audio/video telehealth visit is felt to be most appropriate for this patient at this time.The patient did not have access to video technology/had technical difficulties with video requiring transitioning to audio format only (telephone).  All issues noted in this document were discussed and addressed.  No physical exam could be performed with this format.      See MyChart message from today for the patient's consent to telehealth for [facility].   Date:  04/04/2019   ID:  Matthew [name], DOB 1936/06/13, MRN [id number]  Location: patient's home  Provider location: [address], [place]  Evaluation Performed: Initial Evaluation  PCP:  Antionette Char, MD  Cardiologist:  [name], MD * Electrophysiologist:  None   Chief Complaint: CHF LBBB*  History of Present Illness:    Matthew [name] is a 83 y.o. male who presents via audio/video conferencing for a telehealth visit today for          History of bypass surgery 1991 and /2004.  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  I saw him in consultation and raise the concerns as to whether the PVC burden which appeared to be greater than the 5% noted 6/19 could be contributing to his cardiomyopathy or global left bundle branch block consequential to the TAVR was contributing.  Issues of breath with modest exertion.  No chest pain.  Some peripheral edema-asymmetric but discordant from his venectomy sites   DATE TEST EF   4/17 Echo  55-60%   6/19 LHC    % LIMA-LAD, RIMA-D, SVG-OM3, SVG R/PD patent SVG -OM2 occluded  3/20 Echo   25-30 % LAE ( -/6.6/55)        Date Cr K Hgb Plt  3/20 0.9 3.4 10.9 109 (4/20)         Dx w Fe deficiency 4/20 and started on Fe replacement   Date PVCs  6/19  6%         The  patient denies symptoms of fevers, chills, cough, or new SOB worrisome for COVID 19.    Past Medical History:  Diagnosis Date   Alcohol abuse    stopped drinking 42 years ago from today 2020   Anemia    AS (aortic stenosis)    a. Echo 6/10: EF 55% mild AS; b. echo 06/2015; EF 55-60%, GR1DD, moderate AS, Peak velocity (S): 346 cm/s. Mean gradientS): 28 mm Hg. Peak gradient (S): 48 mm Hg. Valve area (VTI): 1.18 cm2;   c. Echo 4/17 - mild LVH, EF 55-60%, no RWMA, Gr 1 DD, mod to severe AS (mean 28 mmHg, peak 46 mmHg), mild LAE   CAD (coronary artery disease)    a. MI 1996 w/ CABG 1996; b.redo CABG 06/2003; c. Myoview 7/09: EF 54% inferobasal infarct, no ischemia. Myoview 6/10 EF 43% inf wall infarct. no ischemia; c. cath 8/16 s/p DES to VG-OM2. OTW 3VD w/ patent VG->RPDA, VG->OM3,  & LIMA->LAD.  EF 55-65%.   Carotid bruit    2009 0-39% on dopplers bilatrally   Chest pain    GERD (gastroesophageal reflux disease)    History of kidney stones    HOH (hard of hearing)    HTN (hypertension)    Hyperlipidemia    Myocardial infarction [facility]) 1996   Nephrolithiasis  OSA (obstructive sleep apnea)    PAD (peripheral artery disease) ([place]) 02/2014   Subtotal occlusion of right common iliac artery and 70% stenosis in the left common iliac artery. Status post bilateral kissing stent placement. Significant post stenosis aneurysmal dilatation on the right side (any future catheterization through the right femoral artery should be done cautiously to avoid advancing the wire behind the stent struts)   Shortness of breath    with exertion   Thrombocytopenia [facility])     Past Surgical History:  Procedure Laterality Date   ABDOMINAL AORTAGRAM N/A 03/06/2014   Procedure: ABDOMINAL Maxcine Ham;  Surgeon: Wellington Hampshire, MD;  Location: [facility] CATH LAB;  Service: Cardiovascular;  Laterality: N/A;   CARDIAC CATHETERIZATION  02/2014   Severe three-vessel coronary artery disease with patent grafts.     CARDIAC CATHETERIZATION N/A 07/31/2015   Procedure: Right/Left Heart Cath and Coronary/Graft Angiography;  Surgeon: Wellington Hampshire, MD;  Location: [place] CV LAB;  Service: Cardiovascular;  Laterality: N/A;   CARDIAC CATHETERIZATION N/A 07/31/2015   Procedure: Coronary Stent Intervention;  Surgeon: Wellington Hampshire, MD;  Location: [place] CV LAB;  Service: Cardiovascular;  Laterality: N/A;   CARDIAC CATHETERIZATION N/A 05/26/2016   Procedure: Right/Left Heart Cath and Coronary Angiography;  Surgeon: Wellington Hampshire, MD;  Location: [place] CV LAB;  Service: Cardiovascular;  Laterality: N/A;   COLONOSCOPY WITH PROPOFOL N/A 10/07/2018   Procedure: COLONOSCOPY WITH PROPOFOL;  Surgeon: Lin Landsman, MD;  Location: [facility] ENDOSCOPY;  Service: Gastroenterology;  Laterality: N/A;   COLONOSCOPY WITH PROPOFOL N/A 01/08/2019   Procedure: COLONOSCOPY WITH PROPOFOL;  Surgeon: Lin Landsman, MD;  Location: [facility] ENDOSCOPY;  Service: Gastroenterology;  Laterality: N/A;   CORONARY ARTERY BYPASS GRAFT  1991   at [facility]. Redone 2004-3 vessels 1st time and 4 second time   CYSTO     2/3 times for kidney stones   ENTEROSCOPY N/A 12/25/2018   Procedure: ENTEROSCOPY Single balloon;  Surgeon: Lin Landsman, MD;  Location: [place];  Service: Gastroenterology;  Laterality: N/A;   ESOPHAGOGASTRODUODENOSCOPY (EGD) WITH PROPOFOL N/A 10/07/2018   Procedure: ESOPHAGOGASTRODUODENOSCOPY (EGD) WITH PROPOFOL;  Surgeon: Lin Landsman, MD;  Location[facility] ENDOSCOPY;  Service: Gastroenterology;  Laterality: N/A;   GIVENS CAPSULE STUDY N/A 12/22/2018   Procedure: GIVENS CAPSULE STUDY;  Surgeon: Lin Landsman, MD;  Location: [facility] ENDOSCOPY;  Service: Gastroenterology;  Laterality: N/A;   INGUINAL HERNIA REPAIR  11/10/2011   Procedure: HERNIA REPAIR INGUINAL ADULT;  Surgeon: Donato Heinz;  Location: [facility] ORS;  Service: General;  Laterality: Right;   LAPAROSCOPIC RIGHT COLECTOMY  Right 02/09/2019   Procedure: LAPAROSCOPIC RIGHT COLECTOMY;  Surgeon: Jules Husbands, MD;  Location: [facility] ORS;  Service: General;  Laterality: Right;   LEFT HEART CATH AND CORONARY ANGIOGRAPHY Left 05/15/2018   Procedure: LEFT HEART CATH AND CORONARY ANGIOGRAPHY;  Surgeon: Wellington Hampshire, MD;  Location: [place] CV LAB;  Service: Cardiovascular;  Laterality: Left;   LEFT HEART CATHETERIZATION WITH CORONARY/GRAFT ANGIOGRAM N/A 03/06/2014   Procedure: LEFT HEART CATHETERIZATION WITH Beatrix Fetters;  Surgeon: Wellington Hampshire, MD;  Location: [place] CATH LAB;  Service: Cardiovascular;  Laterality: N/A;   STOMACH SURGERY     removal of gastric ulcers   TEE WITHOUT CARDIOVERSION N/A 06/01/2016   Procedure: TRANSESOPHAGEAL ECHOCARDIOGRAM (TEE);  Surgeon: Sherren Mocha, MD;  Location: [place];  Service: Open Heart Surgery;  Laterality: N/A;   TRANSCATHETER AORTIC VALVE REPLACEMENT, TRANSFEMORAL N/A 06/01/2016   Procedure: TRANSCATHETER AORTIC  VALVE REPLACEMENT, TRANSFEMORAL;  Surgeon: Sherren Mocha, MD;  Location: [place];  Service: Open Heart Surgery;  Laterality: N/A;    Current Outpatient Medications  Medication Sig Dispense Refill   acetaminophen (TYLENOL) 325 MG tablet Take 650 mg by mouth daily as needed for mild pain or headache.      aspirin EC 81 MG tablet Take 1 tablet (81 mg total) by mouth daily. 90 tablet 3   atorvastatin (LIPITOR) 80 MG tablet TAKE 1 TABLET (80 MG TOTAL) BY MOUTH DAILY. 90 tablet 0   carvedilol (COREG) 3.125 MG tablet TAKE 1 TABLET TWICE DAILY (Patient taking differently: Take 3.125 mg by mouth 2 (two) times daily with a meal. ) 702 tablet 1   GARLIC PO Take 1 capsule by mouth daily.      Multiple Vitamins-Minerals (PRESERVISION AREDS PO) Take 1 tablet by mouth daily.      nitroGLYCERIN (NITROSTAT) 0.4 MG SL tablet Place 0.4 mg under the tongue every 5 (five) minutes as needed for chest pain.     Omega-3 Fatty Acids (FISH OIL PO) Take 1 capsule by  mouth daily.      pantoprazole (PROTONIX) 40 MG tablet Take 1 tablet (40 mg total) by mouth daily. 90 tablet 3   sacubitril-valsartan (ENTRESTO) 24-26 MG Take 1 tablet by mouth 2 (two) times daily. 180 tablet 3   tamsulosin (FLOMAX) 0.4 MG CAPS capsule Take 1 capsule (0.4 mg total) by mouth daily. 90 capsule 3   traMADol (ULTRAM) 50 MG tablet Take 1 tablet (50 mg total) daily by mouth. 90 tablet 0   TURMERIC PO Take 1 capsule by mouth at bedtime.      No current facility-administered medications for this visit.     Allergies:   Ranexa [ranolazine er]   Social History:  The patient  reports that he quit smoking about 29 years ago. His smoking use included cigarettes. He has a 50.00 pack-year smoking history. He has never used smokeless tobacco. He reports that he does not drink alcohol or use drugs.   Family History:  The patient's   family history includes Heart disease in his father and mother.   ROS:  Please see the history of present illness.   All other systems are personally reviewed and negative.    Exam:    Vital Signs:  BP (!) 170/56    Pulse 60    Ht 5\' 9"  (1.753 m)    Wt 190 lb (86.2 kg)    BMI 28.06 kg/m    *  Well appearing, alert and conversant, regular work of breathing,  good skin color Eyes- anicteric, neuro- grossly intact, skin- no apparent rash or lesions or cyanosis, mouth- oral mucosa is pink   Labs/Other Tests and Data Reviewed:    Recent Labs: 10/02/2018: TSH 2.817 01/17/2019: ALT 17 02/10/2019: Magnesium 2.0 02/13/2019: BUN 11; Creatinine, Ser 0.90; Potassium 3.4; Sodium 140 03/20/2019: Hemoglobin 10.9; Platelets 109   Wt Readings from Last 3 Encounters:  04/04/19 190 lb (86.2 kg)  02/26/19 190 lb (86.2 kg)  02/13/19 197 lb 12 oz (89.7 kg)     Other studies personally reviewed: Additional studies/ records that were reviewed today include: As above  Review of the above records today demonstrates: *As above         ASSESSMENT & PLAN:     Ischemic heart disease with prior bypass  TAVI  Congestive heart failure-chronic-systolic-class III  Left bundle branch block complicating the above  Cardiomyopathy complicating the above  PVCs question burden  Anemia  Matthew [name] has ongoing problems with congestive heart failure in the context of left ventricular dysfunction.  Potential contributors include PVCs previously measured in the 6% range but in the hospital being closer to 15%, as well as left bundle branch block induced by TAVI  Hence, we will undertake a monitor (ZIO Patch) for 7 days to measure his PVC burden.  In the event that it is non-likely contributing, we will then proceed with CRT P to try to mitigate the effects of the left bundle branch block induced by TAVI.  We briefly discussed benefits and risks of CRT  I have reviewed this with him and his daughter.   He is currently on iron replacement therapy.  He has also had thrombocytopenia but it remains not significant at this point with levels ranging between 110 and 170.     COVID 19 screen The patient denies symptoms of COVID 19 at this time.  The importance of social distancing was discussed today.  Follow-up:  Post Patch  Next remote: na  Current medicines are reviewed at length with the patient today.   The patient does not have concerns regarding his medicines.  The following changes were made today:  none  Labs/ tests ordered today include: Zio patch 7 days  No orders of the defined types were placed in this encounter.   Future tests (   Patient Risk:  after full review of this patients clinical status, I feel that they are at moderate risk at this time.  Today, I have spent 11 minutes with the patient with telehealth technology discussing As above   Signed, Virl Axe, MD  04/04/2019 3:41 PM     [facility] [address] [place] [address] [phone] (office) [phone] (fax)

## 2019-04-09 ENCOUNTER — Ambulatory Visit (INDEPENDENT_AMBULATORY_CARE_PROVIDER_SITE_OTHER): Payer: Medicare HMO

## 2019-04-09 DIAGNOSIS — I359 Nonrheumatic aortic valve disorder, unspecified: Secondary | ICD-10-CM | POA: Diagnosis not present

## 2019-04-09 DIAGNOSIS — Z952 Presence of prosthetic heart valve: Secondary | ICD-10-CM

## 2019-04-09 DIAGNOSIS — I255 Ischemic cardiomyopathy: Secondary | ICD-10-CM | POA: Diagnosis not present

## 2019-04-09 DIAGNOSIS — I5022 Chronic systolic (congestive) heart failure: Secondary | ICD-10-CM | POA: Diagnosis not present

## 2019-04-09 DIAGNOSIS — I447 Left bundle-branch block, unspecified: Secondary | ICD-10-CM

## 2019-04-12 ENCOUNTER — Telehealth: Payer: Self-pay | Admitting: Internal Medicine

## 2019-04-16 ENCOUNTER — Encounter: Payer: Self-pay | Admitting: Oncology

## 2019-04-17 ENCOUNTER — Encounter: Payer: Self-pay | Admitting: Oncology

## 2019-04-17 ENCOUNTER — Encounter: Payer: Self-pay | Admitting: *Deleted

## 2019-04-26 ENCOUNTER — Other Ambulatory Visit: Payer: Self-pay

## 2019-04-26 ENCOUNTER — Ambulatory Visit: Payer: Self-pay | Admitting: Oncology

## 2019-04-30 ENCOUNTER — Telehealth: Payer: Self-pay | Admitting: Internal Medicine

## 2019-04-30 NOTE — Telephone Encounter (Signed)
Spoke with pt's daughter and advised her that Dr Caryl Comes has not reviewed the results as of yet. We will be contacting her as soon as he makes recommendations. She verbalized understanding and had no additional questions.

## 2019-04-30 NOTE — Telephone Encounter (Signed)
° ° °  Patient's daughter calling for monitor results

## 2019-05-02 ENCOUNTER — Other Ambulatory Visit: Payer: Self-pay

## 2019-05-02 ENCOUNTER — Inpatient Hospital Stay: Payer: Medicare HMO | Attending: Oncology

## 2019-05-02 DIAGNOSIS — D509 Iron deficiency anemia, unspecified: Secondary | ICD-10-CM | POA: Diagnosis not present

## 2019-05-02 LAB — IRON AND TIBC
Iron: 95 ug/dL (ref 45–182)
Saturation Ratios: 29 % (ref 17.9–39.5)
TIBC: 333 ug/dL (ref 250–450)
UIBC: 238 ug/dL

## 2019-05-02 LAB — CBC WITH DIFFERENTIAL/PLATELET
Abs Immature Granulocytes: 0.02 10*3/uL (ref 0.00–0.07)
Basophils Absolute: 0.1 10*3/uL (ref 0.0–0.1)
Basophils Relative: 1 %
Eosinophils Absolute: 0.1 10*3/uL (ref 0.0–0.5)
Eosinophils Relative: 2 %
HCT: 42.5 % (ref 39.0–52.0)
Hemoglobin: 13.8 g/dL (ref 13.0–17.0)
Immature Granulocytes: 0 %
Lymphocytes Relative: 26 %
Lymphs Abs: 1.5 10*3/uL (ref 0.7–4.0)
MCH: 28.5 pg (ref 26.0–34.0)
MCHC: 32.5 g/dL (ref 30.0–36.0)
MCV: 87.8 fL (ref 80.0–100.0)
Monocytes Absolute: 0.6 10*3/uL (ref 0.1–1.0)
Monocytes Relative: 10 %
Neutro Abs: 3.5 10*3/uL (ref 1.7–7.7)
Neutrophils Relative %: 61 %
Platelets: 101 10*3/uL — ABNORMAL LOW (ref 150–400)
RBC: 4.84 MIL/uL (ref 4.22–5.81)
RDW: 14.7 % (ref 11.5–15.5)
WBC: 5.8 10*3/uL (ref 4.0–10.5)
nRBC: 0 % (ref 0.0–0.2)

## 2019-05-02 LAB — FERRITIN: Ferritin: 20 ng/mL — ABNORMAL LOW (ref 24–336)

## 2019-05-04 NOTE — Telephone Encounter (Signed)
Dr. Caryl Comes,   His monitor has been uploaded and in your in-basket to review.   Thank you!

## 2019-05-10 ENCOUNTER — Ambulatory Visit: Payer: Self-pay | Admitting: Gastroenterology

## 2019-05-11 ENCOUNTER — Telehealth: Payer: Self-pay

## 2019-05-11 ENCOUNTER — Encounter: Payer: Self-pay | Admitting: Oncology

## 2019-05-11 NOTE — Telephone Encounter (Signed)
Spoke with pts daughter and reviewed monitor results. She understands Dr Olin Pia recommendation of CRT-P. I will clarify with Dr Caryl Comes as to if he needs to see pt again before procedure is scheduled.   We also discussed pt's recent symptoms of dizziness. I advised her to be sure he is hydrated. She will also call pt's hemotologist as he has long standing anemia currently being followed and treated by Janese Banks. Pts daughter wonders if his dizziness is related to his anemia.  Pts daughter has verbalized understanding of the above and has no additional questions.

## 2019-05-15 ENCOUNTER — Other Ambulatory Visit: Payer: Self-pay | Admitting: Oncology

## 2019-05-15 NOTE — Telephone Encounter (Signed)
We could give him feraheme 2 doses. He gets 2nd dose on 6/19 when he sees me

## 2019-05-18 ENCOUNTER — Telehealth: Payer: Self-pay | Admitting: Internal Medicine

## 2019-05-18 NOTE — Telephone Encounter (Signed)
I spoke with the patient's daughter. See documentation on 04/30/19 phone note.

## 2019-05-18 NOTE — Telephone Encounter (Signed)
Sent MyChart message to daughter.  Need to determine if Dr. Caryl Comes wants to see this patient face to face prior to upgrade procedure.  Advised daughter she would hear from someone shortly.

## 2019-05-18 NOTE — Telephone Encounter (Signed)
Narrative & Impression    Indication:PVC Duration: 7d   Findings PVC 4.7 % Symptoms of palpitations assoc with PVCs Symptoms of  SOB/CPressure assoc with PVCs   Given the relatively low burden of PVCs reasonable to proceed with CRT for LV dysfunction and LBBB

## 2019-05-18 NOTE — Telephone Encounter (Signed)
Patient daughter [name] calling  States she spoke with someone in the device clinic a few days ago  States they were to check with Dr Caryl Comes to see if wanted to see patient before procedure  Please advise

## 2019-05-18 NOTE — Telephone Encounter (Signed)
Attempted to call the patient's daughter, [name].  No answer- I left a message on her identified voice mail to please call back to discuss the patient's monitor results.

## 2019-05-21 ENCOUNTER — Inpatient Hospital Stay: Payer: Medicare HMO | Attending: Oncology

## 2019-05-21 ENCOUNTER — Other Ambulatory Visit: Payer: Self-pay

## 2019-05-21 VITALS — BP 157/70 | HR 50 | Resp 18

## 2019-05-21 DIAGNOSIS — D500 Iron deficiency anemia secondary to blood loss (chronic): Secondary | ICD-10-CM | POA: Insufficient documentation

## 2019-05-21 DIAGNOSIS — D696 Thrombocytopenia, unspecified: Secondary | ICD-10-CM | POA: Diagnosis not present

## 2019-05-21 DIAGNOSIS — Q2733 Arteriovenous malformation of digestive system vessel: Secondary | ICD-10-CM | POA: Insufficient documentation

## 2019-05-21 MED ORDER — SODIUM CHLORIDE 0.9 % IV SOLN
Freq: Once | INTRAVENOUS | Status: AC
  Administered 2019-05-21: 14:00:00 via INTRAVENOUS
  Filled 2019-05-21: qty 250

## 2019-05-21 MED ORDER — SODIUM CHLORIDE 0.9 % IV SOLN
510.0000 mg | Freq: Once | INTRAVENOUS | Status: AC
  Administered 2019-05-21: 510 mg via INTRAVENOUS
  Filled 2019-05-21: qty 17

## 2019-05-31 ENCOUNTER — Encounter: Payer: Self-pay | Admitting: *Deleted

## 2019-05-31 NOTE — Progress Notes (Signed)
Error

## 2019-06-01 ENCOUNTER — Ambulatory Visit: Payer: Medicare HMO

## 2019-06-01 ENCOUNTER — Other Ambulatory Visit: Payer: Self-pay

## 2019-06-01 ENCOUNTER — Other Ambulatory Visit: Payer: Medicare HMO

## 2019-06-01 ENCOUNTER — Ambulatory Visit: Payer: Medicare HMO | Admitting: Oncology

## 2019-06-04 ENCOUNTER — Encounter: Payer: Self-pay | Admitting: Oncology

## 2019-06-04 ENCOUNTER — Ambulatory Visit (INDEPENDENT_AMBULATORY_CARE_PROVIDER_SITE_OTHER): Payer: Medicare HMO | Admitting: Gastroenterology

## 2019-06-04 ENCOUNTER — Other Ambulatory Visit: Payer: Self-pay

## 2019-06-04 ENCOUNTER — Inpatient Hospital Stay: Payer: Medicare HMO

## 2019-06-04 ENCOUNTER — Encounter: Payer: Self-pay | Admitting: Gastroenterology

## 2019-06-04 ENCOUNTER — Inpatient Hospital Stay (HOSPITAL_BASED_OUTPATIENT_CLINIC_OR_DEPARTMENT_OTHER): Payer: Medicare HMO | Admitting: Oncology

## 2019-06-04 VITALS — BP 183/72 | HR 109 | Temp 97.9°F | Ht 69.0 in | Wt 193.0 lb

## 2019-06-04 VITALS — BP 139/58 | HR 60 | Temp 98.5°F | Ht 69.0 in | Wt 194.4 lb

## 2019-06-04 VITALS — BP 145/70 | HR 64 | Resp 18

## 2019-06-04 DIAGNOSIS — D500 Iron deficiency anemia secondary to blood loss (chronic): Secondary | ICD-10-CM | POA: Diagnosis not present

## 2019-06-04 DIAGNOSIS — D696 Thrombocytopenia, unspecified: Secondary | ICD-10-CM | POA: Diagnosis not present

## 2019-06-04 DIAGNOSIS — K5520 Angiodysplasia of colon without hemorrhage: Secondary | ICD-10-CM | POA: Diagnosis not present

## 2019-06-04 DIAGNOSIS — D126 Benign neoplasm of colon, unspecified: Secondary | ICD-10-CM | POA: Diagnosis not present

## 2019-06-04 DIAGNOSIS — Q2733 Arteriovenous malformation of digestive system vessel: Secondary | ICD-10-CM | POA: Diagnosis not present

## 2019-06-04 DIAGNOSIS — D509 Iron deficiency anemia, unspecified: Secondary | ICD-10-CM

## 2019-06-04 LAB — CBC WITH DIFFERENTIAL/PLATELET
Abs Immature Granulocytes: 0.02 10*3/uL (ref 0.00–0.07)
Basophils Absolute: 0.1 10*3/uL (ref 0.0–0.1)
Basophils Relative: 1 %
Eosinophils Absolute: 0.1 10*3/uL (ref 0.0–0.5)
Eosinophils Relative: 2 %
HCT: 42.1 % (ref 39.0–52.0)
Hemoglobin: 13.9 g/dL (ref 13.0–17.0)
Immature Granulocytes: 0 %
Lymphocytes Relative: 24 %
Lymphs Abs: 1.6 10*3/uL (ref 0.7–4.0)
MCH: 28.7 pg (ref 26.0–34.0)
MCHC: 33 g/dL (ref 30.0–36.0)
MCV: 87 fL (ref 80.0–100.0)
Monocytes Absolute: 0.6 10*3/uL (ref 0.1–1.0)
Monocytes Relative: 10 %
Neutro Abs: 4.2 10*3/uL (ref 1.7–7.7)
Neutrophils Relative %: 63 %
Platelets: 88 10*3/uL — ABNORMAL LOW (ref 150–400)
RBC: 4.84 MIL/uL (ref 4.22–5.81)
RDW: 14.9 % (ref 11.5–15.5)
WBC: 6.7 10*3/uL (ref 4.0–10.5)
nRBC: 0 % (ref 0.0–0.2)

## 2019-06-04 LAB — IRON AND TIBC
Iron: 64 ug/dL (ref 45–182)
Saturation Ratios: 25 % (ref 17.9–39.5)
TIBC: 252 ug/dL (ref 250–450)
UIBC: 188 ug/dL

## 2019-06-04 LAB — FERRITIN: Ferritin: 129 ng/mL (ref 24–336)

## 2019-06-04 MED ORDER — SODIUM CHLORIDE 0.9 % IV SOLN
510.0000 mg | Freq: Once | INTRAVENOUS | Status: AC
  Administered 2019-06-04: 510 mg via INTRAVENOUS
  Filled 2019-06-04: qty 17

## 2019-06-04 MED ORDER — SODIUM CHLORIDE 0.9 % IV SOLN
Freq: Once | INTRAVENOUS | Status: AC
  Administered 2019-06-04: 12:00:00 via INTRAVENOUS
  Filled 2019-06-04: qty 250

## 2019-06-04 NOTE — Progress Notes (Signed)
Cephas Darby, MD [address]  [place]  [place], [place] [address]  Main: [phone]  Fax: [phone]    Gastroenterology Consultation  Referring Provider:     Barry Dienes, NP Primary Care Physician:  Antionette Char, MD Primary Gastroenterologist:  Dr. Cephas Darby Reason for Consultation:     Iron deficiency anemia        HPI:   Matthew [name] is a 83 y.o. male referred by Dr. Tamala Julian, Clent Demark, MD  for consultation & management of iron deficiency anemia secondary to upper GI bleed.  Patient was admitted to [facility] on 10/02/2018 secondary to acute symptomatic anemia with melena, hemoglobin dropped from 10.6 to 7.  Patient has been taking aspirin, Plavix as well as iron supplementation in the past.  Patient was found to have severe iron deficiency.  Recent blood transition, underwent EGD and colonoscopy.  Upper endoscopy revealed several gastric nonbleeding AVMs which were treated with APC, 2 cm hiatal hernia.  Colonoscopy was unremarkable for lower GI source of GI bleed.  He was incidentally found to have a large flat polyp in the cecum that was not resected due to poor prep.  Another polyp in the transverse colon was removed, it was tubular adenoma.  Patient received IV iron, also started on B12 due to severe B12 deficiency.  He is here as a hospital follow-up  Interval summary: Patient is taking oral iron 3 pills daily and his stools are dark but formed.  He reports feeling well since discharge.  He denies chest pain, shortness of breath, lightheadedness.  He is also taking oral B12 daily.  He had follow-up labs done at his PCPs office last week.  Hemoglobin is improving and iron level was normal.  Follow-up visit 06/04/2019 Patient underwent right hemicolectomy on 02/09/2019 for large cecal polyp which was tubular adenoma without any complications.  He recovered well from surgery.  He is planning to get pacemaker in [place].  He just saw Dr. Janese Banks today.  CBC and iron studies  normal.  Patient reports intermittent postprandial urgency.  He is currently taking oral iron twice daily.  He denies black stools are rectal bleeding.  He is taking garlic tablets as well as aspirin 81 mg daily  NSAIDs: None  Antiplts/Anticoagulants/Anti thrombotics: Aspirin for coronary disease and aortic stenosis  GI Procedures:  EGD 10/07/2018 - Normal duodenal bulb and second portion of the duodenum. - 2 cm hiatal hernia. 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 Treated with argon plasma coagulation (APC). Clips (MR conditional) were placed. - Normal gastroesophageal junction and esophagus. - No specimens collected.  Colonoscopy 10/07/2018 - Preparation of the colon was poor. - No lower GI source of melena identified - One 12 mm polyp in the cecum. Resection not attempted. - One 10 mm polyp in the transverse colon, removed with a hot snare. Resected and retrieved. - Normal mucosa in the entire examined colon. - Stool in the entire examined colon. - Severe diverticulosis in the sigmoid colon. There was no evidence of diverticular bleeding. - Non-bleeding external and internal hemorrhoids. DIAGNOSIS:  A. COLON POLYP, TRANSVERSE; HOT SNARE:  - TUBULAR ADENOMA.  - NEGATIVE FOR HIGH-GRADE DYSPLASIA AND MALIGNANCY.   Video capsule endoscopy 12/22/2018 Revealed small bowel AVMs  Small bowel endoscopy 12/25/2018 - A few non-bleeding angioectasias in the duodenum. Treated with argon plasma coagulation (APC). - A few non-bleeding angioectasias in the jejunum. Treated with argon plasma coagulation (APC). - No specimens collected.  Colonoscopy  01/08/2019 - One 15 mm polyp in the cecum, removed with mucosal resection. Incomplete resection. Resected tissue retrieved. Injected. - Five 4 to 6 mm polyps in the ascending colon, removed with a hot snare. Resected and retrieved. - Two 8 mm polyps in the transverse colon, removed with a hot snare. Resected and retrieved. - One 5 mm  polyp in the transverse colon, removed with a cold snare. Resected and retrieved. - Three 7 to 9 mm polyps in the descending colon, removed with a hot snare. Resected and retrieved. - One 9 mm polyp in the sigmoid colon, removed with a hot snare. Resected and retrieved. - The distal rectum and anal verge are normal on retroflexion view. - Diverticulosis in the sigmoid colon. - Mucosal resection was performed. Resection was incomplete. The resected tissue was retrieved.  DIAGNOSIS:  A. CECUM POLYP; HOT SNARE:  - TUBULAR ADENOMA (MULTIPLE FRAGMENTS).  - NEGATIVE FOR HIGH-GRADE DYSPLASIA AND MALIGNANCY.   B. COLON POLYP X5, ASCENDING; HOT SNARE:  - TUBULAR ADENOMA (MULTIPLE FRAGMENTS).  - NEGATIVE FOR HIGH-GRADE DYSPLASIA AND MALIGNANCY.   C. COLON POLYP X3, TRANSVERSE; HOT SNARE (2 AND COLD SNARE (1):  - TUBULAR ADENOMA (3).  - NEGATIVE FOR HIGH-GRADE DYSPLASIA AND MALIGNANCY.   D. COLON POLYP X3, DESCENDING; HOT SNARE:  - TUBULAR ADENOMA (3).  - NEGATIVE FOR HIGH-GRADE DYSPLASIA AND MALIGNANCY.   E. COLON POLYP, SIGMOID; HOT SNARE:  - TUBULAR ADENOMA.  - SEE COMMENT.  - NEGATIVE FOR HIGH-GRADE DYSPLASIA AND MALIGNANCY.    Past Medical History:  Diagnosis Date  . Alcohol abuse    stopped drinking 42 years ago from today 2020  . Anemia   . AS (aortic stenosis)    a. Echo 6/10: EF 55% mild AS; b. echo 06/2015; EF 55-60%, GR1DD, moderate AS, Peak velocity (S): 346 cm/s. Mean gradientS): 28 mm Hg. Peak gradient (S): 48 mm Hg. Valve area (VTI): 1.18 cm2;   c. Echo 4/17 - mild LVH, EF 55-60%, no RWMA, Gr 1 DD, mod to severe AS (mean 28 mmHg, peak 46 mmHg), mild LAE  . CAD (coronary artery disease)    a. MI 1996 w/ CABG 1996; b.redo CABG 06/2003; c. Myoview 7/09: EF 54% inferobasal infarct, no ischemia. Myoview 6/10 EF 43% inf wall infarct. no ischemia; c. cath 8/16 s/p DES to VG-OM2. OTW 3VD w/ patent VG->RPDA, VG->OM3,  & LIMA->LAD.  EF 55-65%.  . Carotid bruit    2009 0-39% on  dopplers bilatrally  . Chest pain   . GERD (gastroesophageal reflux disease)   . History of kidney stones   . HOH (hard of hearing)   . HTN (hypertension)   . Hyperlipidemia   . Myocardial infarction ([facility]) 1996  . Nephrolithiasis   . OSA (obstructive sleep apnea)   . PAD (peripheral artery disease) ([place]) 02/2014   Subtotal occlusion of right common iliac artery and 70% stenosis in the left common iliac artery. Status post bilateral kissing stent placement. 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 Shortness of breath    with exertion  . Thrombocytopenia ([facility])     Past Surgical History:  Procedure Laterality Date  . ABDOMINAL AORTAGRAM N/A 03/06/2014   Procedure: ABDOMINAL Maxcine Ham;  Surgeon: Wellington Hampshire, MD;  Location: [facility] CATH LAB;  Service: Cardiovascular;  Laterality: N/A;  . CARDIAC CATHETERIZATION  02/2014  Severe three-vessel coronary artery disease with patent grafts.   Marland Kitchen CARDIAC CATHETERIZATION N/A 07/31/2015   Procedure: Right/Left Heart Cath and Coronary/Graft Angiography;  Surgeon: Wellington Hampshire, MD;  Location: [facility] CV LAB;  Service: Cardiovascular;  Laterality: N/A;  . CARDIAC CATHETERIZATION N/A 07/31/2015   Procedure: Coronary Stent Intervention;  Surgeon: Wellington Hampshire, MD;  Location: [facility] CV LAB;  Service: Cardiovascular;  Laterality: N/A;  . CARDIAC CATHETERIZATION N/A 05/26/2016   Procedure: Right/Left Heart Cath and Coronary Angiography;  Surgeon: Wellington Hampshire, MD;  Location: [facility] CV LAB;  Service: Cardiovascular;  Laterality: N/A;  . COLONOSCOPY WITH PROPOFOL N/A 10/07/2018   Procedure: COLONOSCOPY WITH PROPOFOL;  Surgeon: Lin Landsman, MD;  Location: [facility] END[facility]COPY;  Service: Gastroenterology;  Laterality: N/A;  . COLONOSCOPY WITH PROPOFOL N/A 01/08/2019    Procedure: COLONOSCOPY WITH PROPOFOL;  Surgeon: Lin Landsman, MD;  Location: [facility] ENDOSCOPY;  Service: Gastroenterology;  Laterality: N/A;  . CORONARY ARTERY BYPASS GRAFT  1991   at [facility]. Redone 2004-3 vessels 1st time and 4 second time  . CYSTO     2/3 times for kidney stones  . ENTEROSCOPY N/A 12/25/2018   Procedure: ENTEROSCOPY Single balloon;  Surgeon: Lin Landsman, MD;  Location: [facility];  Service: Gastroenterology;  Laterality: N/A;  . ESOPHAGOGASTRODUODENOSCOPY (EGD) WITH PROPOFOL N/A 10/07/2018   Procedure: ESOPHAGOGASTRODUODENOSCOPY (EGD) WITH PROPOFOL;  Surgeon: Lin Landsman, MD;  [facility] ENDOSCOPY;  Service: Gastroenterology;  Laterality: N/A;  . GIVENS CAPSULE STUDY N/A 12/22/2018   Procedure: GIVENS CAPSULE STUDY;  Surgeon: Lin Landsman, MD;  Location: [facility] ENDOSCOPY;  Service: Gastroenterology;  Laterality: N/A;  . INGUINAL HERNIA REPAIR  11/10/2011   Procedure: HERNIA REPAIR INGUINAL ADULT;  Surgeon: Donato Heinz;  Location: [facility];  Service: General;  Laterality: Right;  . LAPAROSCOPIC RIGHT COLECTOMY Right 02/09/2019   Procedure: LAPAROSCOPIC RIGHT COLECTOMY;  Surgeon: Jules Husbands, MD;  Location: [facility] ORS;  Service: General;  Laterality: Right;  . LEFT HEART CATH AND CORONARY ANGIOGRAPHY Left 05/15/2018   Procedure: LEFT HEART CATH AND CORONARY ANGIOGRAPHY;  Surgeon: Wellington Hampshire, MD;  Location: [place] [facility] CV LAB;  Service: Cardiovascular;  Laterality: Left;  . LEFT HEART CATHETERIZATION WITH CORONARY/GRAFT ANGIOGRAM N/A 03/06/2014   Procedure: LEFT HEART CATHETERIZATION WITH Beatrix Fetters;  Surgeon: Wellington Hampshire, MD;  Location: [facility] CATH LAB;  Service: Cardiovascular;  Laterality: N/A;  . STOMACH SURGERY     removal of gastric ulcers  . TEE WITHOUT CARDIOVERSION N/A 06/01/2016   Procedure: TRANSESOPHAGEAL ECHOCARDIOGRAM (TEE);  Surgeon: Sherren Mocha, MD;  Location: [facility];  Service: Open Heart Surgery;  Laterality:  N/A;  . TRANSCATHETER AORTIC VALVE REPLACEMENT, TRANSFEMORAL N/A 06/01/2016   Procedure: TRANSCATHETER AORTIC VALVE REPLACEMENT, TRANSFEMORAL;  Surgeon: Sherren Mocha, MD;  Location: [facility];  Service: Open Heart Surgery;  Laterality: N/A;    Current Outpatient Medications:  .  acetaminophen (TYLENOL) 325 MG tablet, Take 650 mg by mouth daily as needed for mild pain or headache. , Disp: , Rfl:  .  aspirin EC 81 MG tablet, Take 1 tablet (81 mg total) by mouth daily., Disp: 90 tablet, Rfl: 3 .  atorvastatin (LIPITOR) 80 MG tablet, TAKE 1 TABLET (80 MG TOTAL) BY MOUTH DAILY., Disp: 90 tablet, Rfl: 0 .  carvedilol (COREG) 3.125 MG tablet, TAKE 1 TABLET TWICE DAILY (Patient taking differently: Take 3.125 mg by mouth 2 (two) times daily with a meal. ), Disp: 180 tablet, Rfl: 1 .  Ferrous Sulfate (IRON) 325 (65 Fe) MG TABS, Take 325 mg by mouth 2 (two) times a day., Disp: , Rfl:  .  GARLIC PO, Take 1 capsule by mouth daily. , Disp: , Rfl:  .  Multiple Vitamins-Minerals (PRESERVISION AREDS PO), Take 1 tablet by mouth daily. , Disp: , Rfl:  .  nitroGLYCERIN (NITROSTAT) 0.4 MG SL tablet, Place 0.4 mg under the tongue every 5 (five) minutes as needed for chest pain., Disp: , Rfl:  .  Omega-3 Fatty Acids (FISH OIL PO), Take 1 capsule by mouth daily. , Disp: , Rfl:  .  pantoprazole (PROTONIX) 40 MG tablet, Take 1 tablet (40 mg total) by mouth daily., Disp: 90 tablet, Rfl: 3 .  sacubitril-valsartan (ENTRESTO) 24-26 MG, Take 1 tablet by mouth 2 (two) times daily., Disp: 180 tablet, Rfl: 3 .  tamsulosin (FLOMAX) 0.4 MG CAPS capsule, Take 1 capsule (0.4 mg total) by mouth daily., Disp: 90 capsule, Rfl: 3 .  TURMERIC PO, Take 1 capsule by mouth at bedtime. , Disp: , Rfl:     Family History  Problem Relation Age of Onset  . Heart disease Father   . Heart disease Mother   . Colon cancer Neg Hx      Social History   Tobacco Use  . Smoking status: Former Smoker    Packs/day: 1.00    Years: 50.00    Pack  years: 50.00    Types: Cigarettes    Quit date: 01/24/1990    Years since quitting: 29.3  . Smokeless tobacco: Never Used  Substance Use Topics  . Alcohol use: No    Alcohol/week: 0.0 standard drinks    Comment: Alcoholic in past  . Drug use: No    Allergies as of 06/04/2019 - Review Complete 06/04/2019  Allergen Reaction Noted  . Ranexa [ranolazine er] Rash 07/19/2018    Review of Systems:    All systems reviewed and negative except where noted in HPI.   Physical Exam:  BP (!) 139/58   Pulse 60   Temp 98.5 F (36.9 C) (Oral)   Ht 5\' 9"  (1.753 m)   Wt 194 lb 6.4 oz (88.2 kg)   BMI 28.71 kg/m  No LMP for male patient.  General:   Alert,  Well-developed, well-nourished, pleasant and cooperative in NAD Head:  Normocephalic and atraumatic. Eyes:  Sclera clear, no icterus.   Conjunctiva pink. Ears:  Normal auditory acuity. Nose:  No deformity, discharge, or lesions. Mouth:  No deformity or lesions,oropharynx pink & moist. Neck:  Supple; no masses or thyromegaly. Lungs:  Respirations even and unlabored.  Clear throughout to auscultation.   No wheezes, crackles, or rhonchi. No acute distress. Heart:  Regular rate and rhythm; no murmurs, clicks, rubs, or gallops. Abdomen:  Normal bowel sounds. Soft, non-tender and non-distended without masses, hepatosplenomegaly or hernias noted.  No guarding or rebound tenderness.   Rectal: Not performed Msk:  Symmetrical without gross deformities. Good, equal movement & strength bilaterally. Pulses:  Normal pulses noted. Extremities:  No clubbing or edema.  No cyanosis. Neurologic:  Alert and oriented x3;  grossly normal neurologically. Psych:  Alert and cooperative. Normal mood and affect.  Imaging Studies: Reviewed  Assessment and Plan:   Matthew [name] is a 83 y.o. Caucasian male with history of aortic stenosis status post aortic valve replacement, coronary artery disease on aspirin 81, iron deficiency anemia secondary to chronic  blood loss from gastric and small bowel AVMs, status post treatment.  Iron deficiency anemia secondary to blood loss from gastric and small bowel AVMs: Resolved Can decrease oral iron to once a day Okay to continue aspirin 81 mg daily Advised to discontinue garlic powder Recheck labs in 3 months  Multiple colon polyps Refer to genetics at cancer center Recommend surveillance colonoscopy in 1 year   Follow up in 3 months   Cephas Darby, MD

## 2019-06-04 NOTE — Progress Notes (Signed)
Hematology/Oncology Consult note [facility]  [phone] [phone]  Patient Care Team: Antionette Char, MD as PCP - General (Family Medicine) Wellington Hampshire, MD as PCP - Cardiology (Cardiology) Wellington Hampshire, MD as Consulting Physician (Cardiology) Daneil Dolin, MD as Consulting Physician (Gastroenterology)   Name of the patient: Matthew [name]  [id number]  10-Aug-1936   Date of visit: 06/04/19  Diagnosis- 1.  Iron deficiency anemia 2.  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 He was recently admitted to the hospital for acute symptomatic anemia from upper GI bleed when his hemoglobin dropped down from 10.6-7. Labs indicated severe iron deficiency. He underwent EGD and colonoscopy. Upper endoscopy showed several gastric AVMs which were treated with APC. 2 cm hiatal hernia was also noted. Colonoscopy showed a large flat polyp in the cecum that was not resected due to poor prep. Patient has seen Dr. Marius Ditch who plans to do a repeat colonoscopy in January 2020. Patient was also found to have low B12 levels of 128. Patient received 2 units of PRBC as well as IV iron in the hospital. He was subsequently discharged on oral B12 and oral iron 3 times a day.  Interval history-patient reports feeling well and denies any complaints at this time.  He has mild chronic fatigue.  ECOG PS- 1 Pain scale- 0 Opioid associated constipation- no  Review of systems- ROS    Allergies  Allergen Reactions  . Ranexa [Ranolazine Er] Rash     Past Medical History:  Diagnosis Date  . Alcohol abuse    stopped drinking 42 years ago from today 2020  . Anemia   . AS (aortic stenosis)    a. Echo  6/10: EF 55% mild AS; b. echo 06/2015; EF 55-60%, GR1DD, moderate AS, Peak velocity (S): 346 cm/s. Mean gradientS): 28 mm Hg. Peak gradient (S): 48 mm Hg. Valve area (VTI): 1.18 cm2;   c. Echo 4/17 - mild LVH, EF 55-60%, no RWMA, Gr 1 DD, mod to severe AS (mean 28 mmHg, peak 46 mmHg), mild LAE  . CAD (coronary artery disease)    a. MI 1996 w/ CABG 1996; b.redo CABG 06/2003; c. Myoview 7/09: EF 54% inferobasal infarct, no ischemia. Myoview 6/10 EF 43% inf wall infarct. no ischemia; c. cath 8/16 s/p DES to VG-OM2. OTW 3VD w/ patent VG->RPDA, VG->OM3,  & LIMA->LAD.  EF 55-65%.  . Carotid bruit    2009 0-39% on dopplers bilatrally  . Chest pain   . GERD (gastroesophageal reflux disease)   . History of kidney stones   . HOH (hard of hearing)   . HTN (hypertension)   . Hyperlipidemia   . Myocardial infarction ([place]) 1996  . Nephrolithiasis   . OSA (obstructive sleep apnea)   . PAD (peripheral artery disease) ([place]) 02/2014   Subtotal occlusion of right common iliac artery and 70% stenosis in the left common iliac artery. Status post bilateral kissing stent placement. 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 Shortness of breath    with exertion  . Thrombocytopenia ([place])      Past Surgical History:  Procedure Laterality Date  .  ABDOMINAL AORTAGRAM N/A 03/06/2014   Procedure: ABDOMINAL Maxcine Ham;  Surgeon: Wellington Hampshire, MD;  Location: [place] CATH LAB;  Service: Cardiovascular;  Laterality: N/A;  . CARDIAC CATHETERIZATION  02/2014   Severe three-vessel coronary artery disease with patent grafts.   Marland Kitchen CARDIAC CATHETERIZATION N/A 07/31/2015   Procedure: Right/Left Heart Cath and Coronary/Graft Angiography;  Surgeon: Wellington Hampshire, MD;  Location: [place] CV LAB;  Service: Cardiovascular;  Laterality: N/A;  . CARDIAC CATHETERIZATION  N/A 07/31/2015   Procedure: Coronary Stent Intervention;  Surgeon: Wellington Hampshire, MD;  Location: [place] CV LAB;  Service: Cardiovascular;  Laterality: N/A;  . CARDIAC CATHETERIZATION N/A 05/26/2016   Procedure: Right/Left Heart Cath and Coronary Angiography;  Surgeon: Wellington Hampshire, MD;  Location: [place] CV LAB;  Service: Cardiovascular;  Laterality: N/A;  . COLONOSCOPY WITH PROPOFOL N/A 10/07/2018   Procedure: COLONOSCOPY WITH PROPOFOL;  Surgeon: Lin Landsman, MD;  Location: [facility] ENDOSCOPY;  Service: Gastroenterology;  Laterality: N/A;  . COLONOSCOPY WITH PROPOFOL N/A 01/08/2019   Procedure: COLONOSCOPY WITH PROPOFOL;  Surgeon: Lin Landsman, MD;  Location[facility]COPY;  Service: Gastroenterology;  Laterality: N/A;  . CORONARY ARTERY BYPASS GRAFT  1991   at [facility]. Redone 2004-3 vessels 1st time and 4 second time  . CYSTO     2/3 times for kidney stones  . ENTEROSCOPY N/A 12/25/2018   Procedure: ENTEROSCOPY Single balloon;  Surgeon: Lin Landsman, MD;  Location: [place];  Service: Gastroenterology;  Laterality: N/A;  . ESOPHAGOGASTRODUODENOSCOPY (EGD) WITH PROPOFOL N/A 10/07/2018   Procedure: ESOPHAGOGASTRODUODENOSCOPY (EGD) WITH PROPOFOL;  Surgeon: Lin Landsman, MD;  Location: [facility] ENDOSCOPY;  Service: Gastroenterology;  Laterality: N/A;  . GIVENS CAPSULE STUDY N/A 12/22/2018   Procedure: GIVENS CAPSULE STUDY;  Surgeon: Lin Landsman, MD;  Location: [facility] ENDOSCOPY;  Service: Gastroenterology;  Laterality: N/A;  . INGUINAL HERNIA REPAIR  11/10/2011   Procedure: HERNIA REPAIR INGUINAL ADULT;  Surgeon: Donato Heinz;  Location: [facility];  Service: General;  Laterality: Right;  . LAPAROSCOPIC RIGHT COLECTOMY Right 02/09/2019   Procedure: LAPAROSCOPIC RIGHT COLECTOMY;  Surgeon: Jules Husbands, MD;  Location: [facility] ORS;  Service: General;  Laterality: Right;  . LEFT HEART CATH AND CORONARY ANGIOGRAPHY Left 05/15/2018   Procedure: LEFT HEART CATH AND  CORONARY ANGIOGRAPHY;  Surgeon: Wellington Hampshire, MD;  Location: [place] CV LAB;  Service: Cardiovascular;  Laterality: Left;  . LEFT HEART CATHETERIZATION WITH CORONARY/GRAFT ANGIOGRAM N/A 03/06/2014   Procedure: LEFT HEART CATHETERIZATION WITH Beatrix Fetters;  Surgeon: Wellington Hampshire, MD;  Location: [place] CATH LAB;  Service: Cardiovascular;  Laterality: N/A;  . STOMACH SURGERY     removal of gastric ulcers  . TEE WITHOUT CARDIOVERSION N/A 06/01/2016   Procedure: TRANSESOPHAGEAL ECHOCARDIOGRAM (TEE);  Surgeon: Sherren Mocha, MD;  Location: [place];  Service: Open Heart Surgery;  Laterality: N/A;  . TRANSCATHETER AORTIC VALVE REPLACEMENT, TRANSFEMORAL N/A 06/01/2016   Procedure: TRANSCATHETER AORTIC VALVE REPLACEMENT, TRANSFEMORAL;  Surgeon: Sherren Mocha, MD;  Location: [place];  Service: Open Heart Surgery;  Laterality: N/A;    Social History   Socioeconomic History  . Marital status: Married    Spouse name: Not on file  . Number of children: 2  . Years of education: Not on file  . Highest education level: Not on file  Occupational History  . Occupation: Retired  Scientific laboratory technician  . Financial resource strain: Not on file  . Food insecurity    Worry: Not on file  Inability: Not on file  . Transportation needs    Medical: Not on file    Non-medical: Not on file  Tobacco Use  . Smoking status: Former Smoker    Packs/day: 1.00    Years: 50.00    Pack years: 50.00    Types: Cigarettes    Quit date: 01/24/1990    Years since quitting: 29.3  . Smokeless tobacco: Never Used  Substance and Sexual Activity  . Alcohol use: No    Alcohol/week: 0.0 standard drinks    Comment: Alcoholic in past  . Drug use: No  . Sexual activity: Never  Lifestyle  . Physical activity    Days per week: Not on file    Minutes per session: Not on file  . Stress: Not on file  Relationships  . Social Herbalist on phone: Not on file    Gets together: Not on file    Attends  religious service: Not on file    Active member of club or organization: Not on file    Attends meetings of clubs or organizations: Not on file    Relationship status: Not on file  . Intimate partner violence    Fear of current or ex partner: Not on file    Emotionally abused: Not on file    Physically abused: Not on file    Forced sexual activity: Not on file  Other Topics Concern  . Not on file  Social History Narrative   Married an lives with wife in [place]. Retired form [employer].     Family History  Problem Relation Age of Onset  . Heart disease Father   . Heart disease Mother   . Colon cancer Neg Hx      Current Outpatient Medications:  .  acetaminophen (TYLENOL) 325 MG tablet, Take 650 mg by mouth daily as needed for mild pain or headache. , Disp: , Rfl:  .  aspirin EC 81 MG tablet, Take 1 tablet (81 mg total) by mouth daily., Disp: 90 tablet, Rfl: 3 .  atorvastatin (LIPITOR) 80 MG tablet, TAKE 1 TABLET (80 MG TOTAL) BY MOUTH DAILY., Disp: 90 tablet, Rfl: 0 .  carvedilol (COREG) 3.125 MG tablet, TAKE 1 TABLET TWICE DAILY (Patient taking differently: Take 3.125 mg by mouth 2 (two) times daily with a meal. ), Disp: 180 tablet, Rfl: 1 .  Ferrous Sulfate (IRON) 325 (65 Fe) MG TABS, Take 325 mg by mouth 2 (two) times a day., Disp: , Rfl:  .  GARLIC PO, Take 1 capsule by mouth daily. , Disp: , Rfl:  .  Multiple Vitamins-Minerals (PRESERVISION AREDS PO), Take 1 tablet by mouth daily. , Disp: , Rfl:  .  nitroGLYCERIN (NITROSTAT) 0.4 MG SL tablet, Place 0.4 mg under the tongue every 5 (five) minutes as needed for chest pain., Disp: , Rfl:  .  Omega-3 Fatty Acids (FISH OIL PO), Take 1 capsule by mouth daily. , Disp: , Rfl:  .  pantoprazole (PROTONIX) 40 MG tablet, Take 1 tablet (40 mg total) by mouth daily., Disp: 90 tablet, Rfl: 3 .  sacubitril-valsartan (ENTRESTO) 24-26 MG, Take 1 tablet by mouth 2 (two) times daily., Disp: 180 tablet, Rfl: 3 .  tamsulosin (FLOMAX) 0.4  MG CAPS capsule, Take 1 capsule (0.4 mg total) by mouth daily., Disp: 90 capsule, Rfl: 3 .  TURMERIC PO, Take 1 capsule by mouth at bedtime. , Disp: , Rfl:   Physical exam:  Vitals:   06/04/19 1043  BP: (!) 183/72  Pulse: (!) 109  Temp: 97.9 F (36.6 C)  TempSrc: Tympanic  Weight: 193 lb (87.5 kg)  Height: 5\' 9"  (1.753 m)   Physical Exam Constitutional:      General: He is not in acute distress. HENT:     Head: Normocephalic and atraumatic.  Eyes:     Pupils: Pupils are equal, round, and reactive to light.  Neck:     Musculoskeletal: Normal range of motion.  Cardiovascular:     Rate and Rhythm: Normal rate and regular rhythm.     Heart sounds: Normal heart sounds.  Pulmonary:     Effort: Pulmonary effort is normal.     Breath sounds: Normal breath sounds.  Abdominal:     General: Bowel sounds are normal.     Palpations: Abdomen is soft.  Skin:    General: Skin is warm and dry.  Neurological:     Mental Status: He is alert and oriented to person, place, and time.      CMP Latest Ref Rng & Units 02/13/2019  Glucose 70 - 99 mg/dL 128(H)  BUN 8 - 23 mg/dL 11  Creatinine 0.61 - 1.24 mg/dL 0.90  Sodium 135 - 145 mmol/L 140  Potassium 3.5 - 5.1 mmol/L 3.4(L)  Chloride 98 - 111 mmol/L 107  CO2 22 - 32 mmol/L 26  Calcium 8.9 - 10.3 mg/dL 8.4(L)  Total Protein 6.5 - 8.1 g/dL -  Total Bilirubin 0.3 - 1.2 mg/dL -  Alkaline Phos 38 - 126 U/L -  AST 15 - 41 U/L -  ALT 0 - 44 U/L -   CBC Latest Ref Rng & Units 06/04/2019  WBC 4.0 - 10.5 K/uL 6.7  Hemoglobin 13.0 - 17.0 g/dL 13.9  Hematocrit 39.0 - 52.0 % 42.1  Platelets 150 - 400 K/uL 88(L)      Assessment and plan- Patient is a 83 y.o. male with following issues:  1.  Iron deficiency anemia: Patient received his first dose of Feraheme on 05/21/2019.  He will be receiving his second dose today.  Repeat CBC ferritin and iron studies in 3 months in 6 months and I will see him back in 6 months  2.  Mild thrombocytopenia:  Continue to monitor possibly secondary to ITP   Visit Diagnosis 1. Iron deficiency anemia due to chronic blood loss   2. Thrombocytopenia ([place])      Dr. Randa Evens, MD, MPH [facility] at [facility] [phone] 06/04/2019 3:22 PM

## 2019-06-05 ENCOUNTER — Ambulatory Visit: Payer: Medicare HMO

## 2019-06-05 ENCOUNTER — Ambulatory Visit: Payer: Medicare HMO | Admitting: Oncology

## 2019-06-05 ENCOUNTER — Other Ambulatory Visit: Payer: Medicare HMO

## 2019-06-20 ENCOUNTER — Other Ambulatory Visit: Payer: Self-pay

## 2019-06-20 ENCOUNTER — Other Ambulatory Visit: Payer: Self-pay | Admitting: Cardiovascular Disease

## 2019-06-20 DIAGNOSIS — D369 Benign neoplasm, unspecified site: Secondary | ICD-10-CM

## 2019-06-20 DIAGNOSIS — K635 Polyp of colon: Secondary | ICD-10-CM

## 2019-06-25 ENCOUNTER — Other Ambulatory Visit: Payer: Self-pay | Admitting: Physician Assistant

## 2019-06-25 ENCOUNTER — Other Ambulatory Visit: Payer: Self-pay | Admitting: *Deleted

## 2019-06-25 MED ORDER — TAMSULOSIN HCL 0.4 MG PO CAPS: 0.4000 mg | ORAL_CAPSULE | Freq: Every day | ORAL | 1 refills | Status: DC

## 2019-06-25 MED ORDER — CARVEDILOL 3.125 MG PO TABS: 3.1250 mg | ORAL_TABLET | Freq: Two times a day (BID) | ORAL | 0 refills | Status: DC

## 2019-06-25 NOTE — Telephone Encounter (Signed)
Pt requesting Refill on Tamsulosin 0.4 mg tablet qd. Pt's daughter mentioned that Christell Faith, PA said he would fill for pt. Please advise if ok to refill medication.

## 2019-06-28 ENCOUNTER — Encounter: Payer: Self-pay | Admitting: Internal Medicine

## 2019-06-28 ENCOUNTER — Telehealth (INDEPENDENT_AMBULATORY_CARE_PROVIDER_SITE_OTHER): Payer: Medicare HMO | Admitting: Internal Medicine

## 2019-06-28 ENCOUNTER — Other Ambulatory Visit: Payer: Self-pay

## 2019-06-28 VITALS — Ht 69.0 in | Wt 193.0 lb

## 2019-06-28 DIAGNOSIS — I5022 Chronic systolic (congestive) heart failure: Secondary | ICD-10-CM

## 2019-06-28 DIAGNOSIS — I255 Ischemic cardiomyopathy: Secondary | ICD-10-CM | POA: Diagnosis not present

## 2019-06-28 DIAGNOSIS — I208 Other forms of angina pectoris: Secondary | ICD-10-CM | POA: Diagnosis not present

## 2019-06-28 DIAGNOSIS — I447 Left bundle-branch block, unspecified: Secondary | ICD-10-CM

## 2019-06-28 MED ORDER — ISOSORBIDE MONONITRATE ER 30 MG PO TB24: 30.0000 mg | ORAL_TABLET | Freq: Every day | ORAL | 3 refills | Status: DC

## 2019-06-28 NOTE — Patient Instructions (Signed)
Medication Instructions:   Your physician has recommended you make the following change in your medication:   Start Imdur 30MG , 1 tablet by mouth once a day  If you need a refill on your cardiac medications before your next appointment, please call your pharmacy.   Lab work:  None ordered today  If you have labs (blood work) drawn today and your tests are completely normal, you will receive your results only by: Marland Kitchen MyChart Message (if you have MyChart) OR . A paper copy in the mail If you have any lab test that is abnormal or we need to change your treatment, we will call you to review the results.  Testing/Procedures:  None ordered today  Follow-Up:  Keep your follow up with Dr. Fletcher Anon on 07/12/19 at 2:20PM

## 2019-06-28 NOTE — H&P (View-Only) (Signed)
Electrophysiology TeleHealth Note   Due to national recommendations of social distancing due to COVID 19, an audio/video telehealth visit is felt to be most appropriate for this patient at this time.  See MyChart message from today for the patient's consent to telehealth for Matthew [name].   Date:  06/28/2019   ID:  [name], DOB [birth year], MRN [id number]  Location: patient's home  Provider location: [address], [place]  Evaluation Performed: Follow-up visit contributing to things since it is gotten so much worse over the last couple years  PCP:  Antionette Char, MD  Cardiologist:  MA Electrophysiologist:  SK   Chief Complaint:  *chest pain  History of Present Illness:    Matthew [name] is a 83 y.o. male who presents via audio/video conferencing for a telehealth visit today. The patient did not have access to video technology/had technical difficulties with video requiring transitioning to audio format only (telephone).  All issues noted in this document were discussed and addressed.  No physical exam could be performed with this format.      Since last being seen in our [name] for CHF and LV dysfunction   the patient reports worsening  Chest discomfort with exertion ongoing for 6 months--and this has been more noticeable over recent weeks, notwithstanding the improvement in the anemia See Below  Had been on isordil, but was stopped sometime within the last year, but I dont see the note that outlined this  History of bypass surgery 1991 and /2004.  TAVR 7/34 complicated by left bundle branch block  I saw him in consultation 3/20 and raised the concerns as to whether the PVC burden which appeared to be greater than the 5% noted 6/19 could be contributing to his cardiomyopathy or global left bundle branch block consequential to the TAVR was contributing.  BP taken 2X day at home >> 160/80's   As there was insignificant PVC ( <5%) thought was to proceed with  CRT-P for LV dysfunction assoc with p TAVR LBBB  DATE TEST EF   4/17 Echo  55-60%   6/19 LHC    % LIMA-LAD, RIMA-D, SVG-OM3, SVG R/PD patent SVG -OM2 occluded  3/20 Echo   25-30 % LAE ( -/6.6/55)        Date Cr K Hgb Plt  3/20 0.9 3.4 10.9 109 (4/20)  6/20    13.9      Date PVCs  6/19  6%  4/20  4.7      Partial colectomy for colonic mass/ non malignant; cx by congestive heart failure 2/20.    Past Medical History:  Diagnosis Date  . Alcohol abuse    stopped drinking 42 years ago from today 2020  . Anemia   . AS (aortic stenosis)    a. Echo 6/10: EF 55% mild AS; b. echo 06/2015; EF 55-60%, GR1DD, moderate AS, Peak velocity (S): 346 cm/s. Mean gradientS): 28 mm Hg. Peak gradient (S): 48 mm Hg. Valve area (VTI): 1.18 cm2;   c. Echo 4/17 - mild LVH, EF 55-60%, no RWMA, Gr 1 DD, mod to severe AS (mean 28 mmHg, peak 46 mmHg), mild LAE  . CAD (coronary artery disease)    a. MI 1996 w/ CABG 1996; b.redo CABG 06/2003; c. Myoview 7/09: EF 54% inferobasal infarct, no ischemia. Myoview 6/10 EF 43% inf wall infarct. no ischemia; c. cath 8/16 s/p DES to VG-OM2. OTW 3VD w/ patent VG->RPDA, VG->OM3,  & LIMA->LAD.  EF  55-65%.  . Carotid bruit    2009 0-39% on dopplers bilatrally  . Chest pain   . GERD (gastroesophageal reflux disease)   . History of kidney stones   . HOH (hard of hearing)   . HTN (hypertension)   . Hyperlipidemia   . Myocardial infarction ([place]) 1996  . Nephrolithiasis   . OSA (obstructive sleep apnea)   . PAD (peripheral artery disease) ([place]) 02/2014   Subtotal occlusion of right common iliac artery and 70% stenosis in the left common iliac artery. Status post bilateral kissing stent placement. 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 Shortness of breath    with exertion  . Thrombocytopenia  ([place])     Past Surgical History:  Procedure Laterality Date  . ABDOMINAL AORTAGRAM N/A 03/06/2014   Procedure: ABDOMINAL Maxcine Ham;  Surgeon: Wellington Hampshire, MD;  Location: [facility] CATH LAB;  Service: Cardiovascular;  Laterality: N/A;  . CARDIAC CATHETERIZATION  02/2014   Severe three-vessel coronary artery disease with patent grafts.   Marland Kitchen CARDIAC CATHETERIZATION N/A 07/31/2015   Procedure: Right/Left Heart Cath and Coronary/Graft Angiography;  Surgeon: Wellington Hampshire, MD;  Location: [place] CV LAB;  Service: Cardiovascular;  Laterality: N/A;  . CARDIAC CATHETERIZATION N/A 07/31/2015   Procedure: Coronary Stent Intervention;  Surgeon: Wellington Hampshire, MD;  Location: [place] CV LAB;  Service: Cardiovascular;  Laterality: N/A;  . CARDIAC CATHETERIZATION N/A 05/26/2016   Procedure: Right/Left Heart Cath and Coronary Angiography;  Surgeon: Wellington Hampshire, MD;  Location: [place] CV LAB;  Service: Cardiovascular;  Laterality: N/A;  . COLONOSCOPY WITH PROPOFOL N/A 10/07/2018   Procedure: COLONOSCOPY WITH PROPOFOL;  Surgeon: Lin Landsman, MD;  Location: [facility] ENDOSCOPY;  Service: Gastroenterology;  Laterality: N/A;  . COLONOSCOPY WITH PROPOFOL N/A 01/08/2019   Procedure: COLONOSCOPY WITH PROPOFOL;  Surgeon: Lin Landsman, MD;  Location: [facility] ENDOSCOPY;  Service: Gastroenterology;  Laterality: N/A;  . CORONARY ARTERY BYPASS GRAFT  1991   at [facility]. Redone 2004-3 vessels 1st time and 4 second time  . CYSTO     2/3 times for kidney stones  . ENTEROSCOPY N/A 12/25/2018   Procedure: ENTEROSCOPY Single balloon;  Surgeon: Lin Landsman, MD;  Location: [place];  Service: Gastroenterology;  Laterality: N/A;  . ESOPHAGOGASTRODUODENOSCOPY (EGD) WITH PROPOFOL N/A 10/07/2018   Procedure: ESOPHAGOGASTRODUODENOSCOPY (EGD) WITH PROPOFOL;  Surgeon: Lin Landsman, MD;  Location: [facility] ENDOSCOPY;  Service: Gastroenterology;  Laterality: N/A;  . GIVENS CAPSULE STUDY N/A  12/22/2018   Procedure: GIVENS CAPSULE STUDY;  Surgeon: Lin Landsman, MD;  Location: [facility] ENDOSCOPY;  Service: Gastroenterology;  Laterality: N/A;  . INGUINAL HERNIA REPAIR  11/10/2011   Procedure: HERNIA REPAIR INGUINAL ADULT;  Surgeon: Donato Heinz;  [facility];  Service: General;  Laterality: Right;  . LAPAROSCOPIC RIGHT COLECTOMY Right 02/09/2019   Procedure: LAPAROSCOPIC RIGHT COLECTOMY;  Surgeon: Jules Husbands, MD;  Location: [facility] ORS;  Service: General;  Laterality: Right;  . LEFT HEART CATH AND CORONARY ANGIOGRAPHY Left 05/15/2018   Procedure: LEFT HEART CATH AND CORONARY ANGIOGRAPHY;  Surgeon: Wellington Hampshire, MD;  Location: [place] CV LAB;  Service: Cardiovascular;  Laterality: Left;  . LEFT HEART CATHETERIZATION WITH CORONARY/GRAFT ANGIOGRAM N/A 03/06/2014   Procedure: LEFT HEART CATHETERIZATION WITH Beatrix Fetters;  Surgeon: Wellington Hampshire, MD;  Location: [place] CATH LAB;  Service:  Cardiovascular;  Laterality: N/A;  . STOMACH SURGERY     removal of gastric ulcers  . TEE WITHOUT CARDIOVERSION N/A 06/01/2016   Procedure: TRANSESOPHAGEAL ECHOCARDIOGRAM (TEE);  Surgeon: Sherren Mocha, MD;  Location: [place];  Service: Open Heart Surgery;  Laterality: N/A;  . TRANSCATHETER AORTIC VALVE REPLACEMENT, TRANSFEMORAL N/A 06/01/2016   Procedure: TRANSCATHETER AORTIC VALVE REPLACEMENT, TRANSFEMORAL;  Surgeon: Sherren Mocha, MD;  Location: [place];  Service: Open Heart Surgery;  Laterality: N/A;    Current Outpatient Medications  Medication Sig Dispense Refill  . acetaminophen (TYLENOL) 325 MG tablet Take 650 mg by mouth daily as needed for mild pain or headache.     Marland Kitchen aspirin EC 81 MG tablet Take 1 tablet (81 mg total) by mouth daily. 90 tablet 3  . atorvastatin (LIPITOR) 80 MG tablet TAKE 1 TABLET EVERY DAY  ( APPOINTMENT IS NEEDED  ) 90 tablet 0  . carvedilol (COREG) 3.125 MG tablet Take 1 tablet (3.125 mg total) by mouth 2 (two) times daily. 180 tablet 0  . Ferrous  Sulfate (IRON) 325 (65 Fe) MG TABS Take 325 mg by mouth 2 (two) times a day.    Marland Kitchen GARLIC PO Take 1 capsule by mouth daily.     . Multiple Vitamins-Minerals (PRESERVISION AREDS PO) Take 1 tablet by mouth daily.     . nitroGLYCERIN (NITROSTAT) 0.4 MG SL tablet Place 0.4 mg under the tongue every 5 (five) minutes as needed for chest pain.    . Omega-3 Fatty Acids (FISH OIL PO) Take 1 capsule by mouth daily.     . pantoprazole (PROTONIX) 40 MG tablet TAKE 1 TABLET (40 MG TOTAL) BY MOUTH DAILY. 90 tablet 3  . sacubitril-valsartan (ENTRESTO) 24-26 MG Take 1 tablet by mouth 2 (two) times daily. 180 tablet 3  . tamsulosin (FLOMAX) 0.4 MG CAPS capsule Take 1 capsule (0.4 mg total) by mouth daily. 90 capsule 1  . TURMERIC PO Take 1 capsule by mouth at bedtime.      No current facility-administered medications for this visit.     Allergies:   Ranexa [ranolazine er]   Social History:  The patient  reports that he quit smoking about 29 years ago. His smoking use included cigarettes. He has a 50.00 pack-year smoking history. He has never used smokeless tobacco. He reports that he does not drink alcohol or use drugs.   Family History:  The patient's   family history includes Heart disease in his father and mother.   ROS:  Please see the history of present illness.   All other systems are personally reviewed and negative.    Exam:    Vital Signs:  Ht 5\' 9"  (1.753 m)   Wt 193 lb (87.5 kg)   BMI 28.50 kg/m       Labs/Other Tests and Data Reviewed:    Recent Labs: 10/02/2018: TSH 2.817 01/17/2019: ALT 17 02/10/2019: Magnesium 2.0 02/13/2019: BUN 11; Creatinine, Ser 0.90; Potassium 3.4; Sodium 140 06/04/2019: Hemoglobin 13.9; Platelets 88   Wt Readings from Last 3 Encounters:  06/28/19 193 lb (87.5 kg)  06/04/19 194 lb 6.4 oz (88.2 kg)  06/04/19 193 lb (87.5 kg)     Other studies personally reviewed: Additional studies/ records that were reviewed today include: *As above    ASSESSMENT & PLAN:     Ischemic heart disease with prior bypass  TAVI  Congestive heart failure-chronic-systolic-class III  Left bundle branch block complicating the above  Cardiomyopathy complicating the above  PVCs question burden  Anemia   Anemia is better and angina is getting worse.  Discussed with Dr MA, will plan to begin imdur 30 mg daily and reassess in a few weeks to consider repeat catheterization-- at the end of that evaluation, will proceed with CRT-P  We spent more than 50% of our >25 min visit in face to face counseling regarding the above  COVID 19 screen The patient denies symptoms of COVID 19 at this time.  The importance of social distancing was discussed today.  Follow-up:  2-3 weeks. telehealth visit  With MA    Current medicines are reviewed at length with the patient today.   The patient does not have concerns regarding his medicines.  The following changes were made today:  Begin imdur 30   Labs/ tests ordered today include:   No orders of the defined types were placed in this encounter.   Future tests ( post COVID )     Patient Risk:  after full review of this patients clinical status, I feel that they are at moderate  risk at this time.  Today, I have spent 25 minutes with the patient with telehealth technology discussing the above.  Signed, Virl Axe, MD  06/28/2019 3:50 PM     [facility] [address] [place] [address] [phone] (office) [phone] (fax)

## 2019-06-28 NOTE — Progress Notes (Signed)
Electrophysiology TeleHealth Note   Due to national recommendations of social distancing due to COVID 19, an audio/video telehealth visit is felt to be most appropriate for this patient at this time.  See MyChart message from today for the patient's consent to telehealth for [facility].   Date:  06/28/2019   ID:  Matthew [name], DOB December 21, 1935, MRN [id number]  Location: patient's home  Provider location: [address] [place]  Evaluation Performed: Follow-up visit contributing to things since it is gotten so much worse over the last couple years  PCP:  Antionette Char, MD  Cardiologist:  MA Electrophysiologist:  SK   Chief Complaint:  *chest pain  History of Present Illness:    Matthew [name] is a 83 y.o. male who presents via audio/video conferencing for a telehealth visit today. The patient did not have access to video technology/had technical difficulties with video requiring transitioning to audio format only (telephone).  All issues noted in this document were discussed and addressed.  No physical exam could be performed with this format.      Since last being seen in our clinic for CHF and LV dysfunction   the patient reports worsening  Chest discomfort with exertion ongoing for 6 months--and this has been more noticeable over recent weeks, notwithstanding the improvement in the anemia See Below  Had been on isordil, but was stopped sometime within the last year, but I dont see the note that outlined this  History of bypass surgery 1991 and /2004.  TAVR 4/76 complicated by left bundle branch block  I saw him in consultation 3/20 and raised the concerns as to whether the PVC burden which appeared to be greater than the 5% noted 6/19 could be contributing to his cardiomyopathy or global left bundle branch block consequential to the TAVR was contributing.  BP taken 2X day at home >> 160/80's   As there was insignificant PVC ( <5%) thought was to proceed with  CRT-P for LV dysfunction assoc with p TAVR LBBB  DATE TEST EF   4/17 Echo  55-60%   6/19 LHC    % LIMA-LAD, RIMA-D, SVG-OM3, SVG R/PD patent SVG -OM2 occluded  3/20 Echo   25-30 % LAE ( -/6.6/55)        Date Cr K Hgb Plt  3/20 0.9 3.4 10.9 109 (4/20)  6/20    13.9      Date PVCs  6/19  6%  4/20  4.7      Partial colectomy for colonic mass/ non malignant; cx by congestive heart failure 2/20.    Past Medical History:  Diagnosis Date  . Alcohol abuse    stopped drinking 42 years ago from today 2020  . Anemia   . AS (aortic stenosis)    a. Echo 6/10: EF 55% mild AS; b. echo 06/2015; EF 55-60%, GR1DD, moderate AS, Peak velocity (S): 346 cm/s. Mean gradientS): 28 mm Hg. Peak gradient (S): 48 mm Hg. Valve area (VTI): 1.18 cm2;   c. Echo 4/17 - mild LVH, EF 55-60%, no RWMA, Gr 1 DD, mod to severe AS (mean 28 mmHg, peak 46 mmHg), mild LAE  . CAD (coronary artery disease)    a. MI 1996 w/ CABG 1996; b.redo CABG 06/2003; c. Myoview 7/09: EF 54% inferobasal infarct, no ischemia. Myoview 6/10 EF 43% inf wall infarct. no ischemia; c. cath 8/16 s/p DES to VG-OM2. OTW 3VD w/ patent VG->RPDA, VG->OM3,  & LIMA->LAD.  EF  55-65%.  . Carotid bruit    2009 0-39% on dopplers bilatrally  . Chest pain   . GERD (gastroesophageal reflux disease)   . History of kidney stones   . HOH (hard of hearing)   . HTN (hypertension)   . Hyperlipidemia   . Myocardial infarction ([place]) 1996  . Nephrolithiasis   . OSA (obstructive sleep apnea)   . PAD (peripheral artery disease) ([place]) 02/2014   Subtotal occlusion of right common iliac artery and 70% stenosis in the left common iliac artery. Status post bilateral kissing stent placement. 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 Shortness of breath    with exertion  . Thrombocytopenia  ([place])     Past Surgical History:  Procedure Laterality Date  . ABDOMINAL AORTAGRAM N/A 03/06/2014   Procedure: ABDOMINAL Maxcine Ham;  Surgeon: Wellington Hampshire, MD;  Location: [place] CATH LAB;  Service: Cardiovascular;  Laterality: N/A;  . CARDIAC CATHETERIZATION  02/2014   Severe three-vessel coronary artery disease with patent grafts.   Marland Kitchen CARDIAC CATHETERIZATION N/A 07/31/2015   Procedure: Right/Left Heart Cath and Coronary/Graft Angiography;  Surgeon: Wellington Hampshire, MD;  Location: [place] CV LAB;  Service: Cardiovascular;  Laterality: N/A;  . CARDIAC CATHETERIZATION N/A 07/31/2015   Procedure: Coronary Stent Intervention;  Surgeon: Wellington Hampshire, MD;  Location: [place] CV LAB;  Service: Cardiovascular;  Laterality: N/A;  . CARDIAC CATHETERIZATION N/A 05/26/2016   Procedure: Right/Left Heart Cath and Coronary Angiography;  Surgeon: Wellington Hampshire, MD;  Location: [place] CV LAB;  Service: Cardiovascular;  Laterality: N/A;  . COLONOSCOPY WITH PROPOFOL N/A 10/07/2018   Procedure: COLONOSCOPY WITH PROPOFOL;  Surgeon: Lin Landsman, MD;  Location: [facility] ENDOSCOPY;  Service: Gastroenterology;  Laterality: N/A;  . COLONOSCOPY WITH PROPOFOL N/A 01/08/2019   Procedure: COLONOSCOPY WITH PROPOFOL;  Surgeon: Lin Landsman, MD;  Location: [facility] ENDOSCOPY;  Service: Gastroenterology;  Laterality: N/A;  . CORONARY ARTERY BYPASS GRAFT  1991   at [facility]. Redone 2004-3 vessels 1st time and 4 second time  . CYSTO     2/3 times for kidney stones  . ENTEROSCOPY N/A 12/25/2018   Procedure: ENTEROSCOPY Single balloon;  Surgeon: Lin Landsman, MD;  Location: [place];  Service: Gastroenterology;  Laterality: N/A;  . ESOPHAGOGASTRODUODENOSCOPY (EGD) WITH PROPOFOL N/A 10/07/2018   Procedure: ESOPHAGOGASTRODUODENOSCOPY (EGD) WITH PROPOFOL;  Surgeon: Lin Landsman, MD;  Location: [facility] ENDOSCOPY;  Service: Gastroenterology;  Laterality: N/A;  . GIVENS CAPSULE STUDY N/A  12/22/2018   Procedure: GIVENS CAPSULE STUDY;  Surgeon: Lin Landsman, MD;  Location: [facility] ENDOSCOPY;  Service: Gastroenterology;  Laterality: N/A;  . INGUINAL HERNIA REPAIR  11/10/2011   Procedure: HERNIA REPAIR INGUINAL ADULT;  Surgeon: Donato Heinz;  Location: [facility];  Service: General;  Laterality: Right;  . LAPAROSCOPIC RIGHT COLECTOMY Right 02/09/2019   Procedure: LAPAROSCOPIC RIGHT COLECTOMY;  Surgeon: Jules Husbands, MD;  Location: [facility] ORS;  Service: General;  Laterality: Right;  . LEFT HEART CATH AND CORONARY ANGIOGRAPHY Left 05/15/2018   Procedure: LEFT HEART CATH AND CORONARY ANGIOGRAPHY;  Surgeon: Wellington Hampshire, MD;  Location: [place] CV LAB;  Service: Cardiovascular;  Laterality: Left;  . LEFT HEART CATHETERIZATION WITH CORONARY/GRAFT ANGIOGRAM N/A 03/06/2014   Procedure: LEFT HEART CATHETERIZATION WITH Beatrix Fetters;  Surgeon: Wellington Hampshire, MD;  Location: [place] CATH LAB;  Service:  Cardiovascular;  Laterality: N/A;  . STOMACH SURGERY     removal of gastric ulcers  . TEE WITHOUT CARDIOVERSION N/A 06/01/2016   Procedure: TRANSESOPHAGEAL ECHOCARDIOGRAM (TEE);  Surgeon: Sherren Mocha, MD;  Location: [place];  Service: Open Heart Surgery;  Laterality: N/A;  . TRANSCATHETER AORTIC VALVE REPLACEMENT, TRANSFEMORAL N/A 06/01/2016   Procedure: TRANSCATHETER AORTIC VALVE REPLACEMENT, TRANSFEMORAL;  Surgeon: Sherren Mocha, MD;  Location: [place];  Service: Open Heart Surgery;  Laterality: N/A;    Current Outpatient Medications  Medication Sig Dispense Refill  . acetaminophen (TYLENOL) 325 MG tablet Take 650 mg by mouth daily as needed for mild pain or headache.     Marland Kitchen aspirin EC 81 MG tablet Take 1 tablet (81 mg total) by mouth daily. 90 tablet 3  . atorvastatin (LIPITOR) 80 MG tablet TAKE 1 TABLET EVERY DAY  ( APPOINTMENT IS NEEDED  ) 90 tablet 0  . carvedilol (COREG) 3.125 MG tablet Take 1 tablet (3.125 mg total) by mouth 2 (two) times daily. 180 tablet 0  . Ferrous  Sulfate (IRON) 325 (65 Fe) MG TABS Take 325 mg by mouth 2 (two) times a day.    Marland Kitchen GARLIC PO Take 1 capsule by mouth daily.     . Multiple Vitamins-Minerals (PRESERVISION AREDS PO) Take 1 tablet by mouth daily.     . nitroGLYCERIN (NITROSTAT) 0.4 MG SL tablet Place 0.4 mg under the tongue every 5 (five) minutes as needed for chest pain.    . Omega-3 Fatty Acids (FISH OIL PO) Take 1 capsule by mouth daily.     . pantoprazole (PROTONIX) 40 MG tablet TAKE 1 TABLET (40 MG TOTAL) BY MOUTH DAILY. 90 tablet 3  . sacubitril-valsartan (ENTRESTO) 24-26 MG Take 1 tablet by mouth 2 (two) times daily. 180 tablet 3  . tamsulosin (FLOMAX) 0.4 MG CAPS capsule Take 1 capsule (0.4 mg total) by mouth daily. 90 capsule 1  . TURMERIC PO Take 1 capsule by mouth at bedtime.      No current facility-administered medications for this visit.     Allergies:   Ranexa [ranolazine er]   Social History:  The patient  reports that he quit smoking about 29 years ago. His smoking use included cigarettes. He has a 50.00 pack-year smoking history. He has never used smokeless tobacco. He reports that he does not drink alcohol or use drugs.   Family History:  The patient's   family history includes Heart disease in his father and mother.   ROS:  Please see the history of present illness.   All other systems are personally reviewed and negative.    Exam:    Vital Signs:  Ht 5\' 9"  (1.753 m)   Wt 193 lb (87.5 kg)   BMI 28.50 kg/m       Labs/Other Tests and Data Reviewed:    Recent Labs: 10/02/2018: TSH 2.817 01/17/2019: ALT 17 02/10/2019: Magnesium 2.0 02/13/2019: BUN 11; Creatinine, Ser 0.90; Potassium 3.4; Sodium 140 06/04/2019: Hemoglobin 13.9; Platelets 88   Wt Readings from Last 3 Encounters:  06/28/19 193 lb (87.5 kg)  06/04/19 194 lb 6.4 oz (88.2 kg)  06/04/19 193 lb (87.5 kg)     Other studies personally reviewed: Additional studies/ records that were reviewed today include: *As above    ASSESSMENT & PLAN:     Ischemic heart disease with prior bypass  TAVI  Congestive heart failure-chronic-systolic-class III  Left bundle branch block complicating the above  Cardiomyopathy complicating the above  PVCs question burden  Anemia   Anemia is better and angina is getting worse.  Discussed with Dr MA, will plan to begin imdur 30 mg daily and reassess in a few weeks to consider repeat catheterization-- at the end of that evaluation, will proceed with CRT-P  We spent more than 50% of our >25 min visit in face to face counseling regarding the above  COVID 19 screen The patient denies symptoms of COVID 19 at this time.  The importance of social distancing was discussed today.  Follow-up:  2-3 weeks. telehealth visit  With MA    Current medicines are reviewed at length with the patient today.   The patient does not have concerns regarding his medicines.  The following changes were made today:  Begin imdur 30   Labs/ tests ordered today include:   No orders of the defined types were placed in this encounter.   Future tests ( post COVID )     Patient Risk:  after full review of this patients clinical status, I feel that they are at moderate  risk at this time.  Today, I have spent 25 minutes with the patient with telehealth technology discussing the above.  Signed, Virl Axe, MD  06/28/2019 3:50 PM     [facility] [address] [place] [address] [phone] (office) [phone] (fax)

## 2019-06-28 NOTE — Addendum Note (Signed)
Addended by: Mady Haagensen on: 06/28/2019 05:21 PM   Modules accepted: Orders

## 2019-07-06 ENCOUNTER — Ambulatory Visit (HOSPITAL_COMMUNITY): Admit: 2019-07-06 | Payer: Medicare HMO | Admitting: Internal Medicine

## 2019-07-06 ENCOUNTER — Encounter (HOSPITAL_COMMUNITY): Payer: Self-pay

## 2019-07-06 SURGERY — BIV PACEMAKER INSERTION CRT-P
Anesthesia: LOCAL

## 2019-07-11 ENCOUNTER — Telehealth: Payer: Self-pay

## 2019-07-11 NOTE — Telephone Encounter (Signed)
  QUESTIONS ANSWERED BY DAUGHTER LYNETTE  COVID-19 Pre-Screening Questions:  . In the past 7 to 10 days have you had a cough, shortness of breath, headache, congestion, fever (100 or greater) body aches, chills, sore throat, or sudden loss of taste or sense of smell? NO . Have you been around anyone with known Covid 19? NO . Have you been around anyone who is awaiting Covid 19 test results in the past 7 to 10 days? NO . Have you been around anyone who has been exposed to Covid 19, or has mentioned symptoms of Covid 19 within the past 7 to 10 days? NO  If you have any concerns/questions about symptoms patients report during screening (either on the phone or at threshold). Contact the provider seeing the patient or DOD for further guidance.  If neither are available contact a member of the leadership team.

## 2019-07-12 ENCOUNTER — Other Ambulatory Visit: Payer: Self-pay

## 2019-07-12 ENCOUNTER — Ambulatory Visit (INDEPENDENT_AMBULATORY_CARE_PROVIDER_SITE_OTHER): Payer: Medicare HMO | Admitting: Cardiovascular Disease

## 2019-07-12 ENCOUNTER — Encounter: Payer: Self-pay | Admitting: *Deleted

## 2019-07-12 ENCOUNTER — Encounter: Payer: Self-pay | Admitting: Cardiovascular Disease

## 2019-07-12 VITALS — BP 150/60 | HR 60 | Ht 69.0 in | Wt 191.0 lb

## 2019-07-12 DIAGNOSIS — Z01812 Encounter for preprocedural laboratory examination: Secondary | ICD-10-CM | POA: Diagnosis not present

## 2019-07-12 DIAGNOSIS — I2510 Atherosclerotic heart disease of native coronary artery without angina pectoris: Secondary | ICD-10-CM

## 2019-07-12 DIAGNOSIS — I5022 Chronic systolic (congestive) heart failure: Secondary | ICD-10-CM | POA: Diagnosis not present

## 2019-07-12 DIAGNOSIS — I255 Ischemic cardiomyopathy: Secondary | ICD-10-CM

## 2019-07-12 MED ORDER — ENTRESTO 49-51 MG PO TABS: 1.0000 | ORAL_TABLET | Freq: Two times a day (BID) | ORAL | 2 refills | Status: DC

## 2019-07-12 NOTE — Patient Instructions (Signed)
Medication Instructions:  Your physician has recommended you make the following change in your medication:   INCREASE Entresto to 49-51mg  twice daily. An Rx has been sent to your pharmacy   If you need a refill on your cardiac medications before your next appointment, please call your pharmacy.   Lab work: None ordered If you have labs (blood work) drawn today and your tests are completely normal, you will receive your results only by: Marland Kitchen MyChart Message (if you have MyChart) OR . A paper copy in the mail If you have any lab test that is abnormal or we need to change your treatment, we will call you to review the results.  Testing/Procedures: None ordered  Follow-Up: At [facility], you and your health needs are our priority.  As part of our continuing mission to provide you with exceptional heart care, we have created designated Provider Care Teams.  These Care Teams include your primary Cardiologist (physician) and Advanced Practice Providers (APPs -  Physician Assistants and Nurse Practitioners) who all work together to provide you with the care you need, when you need it. You will need a follow up appointment in 4 months.  Please call our office 2 months in advance to schedule this appointment.  You may see [name], MD or one of the following Advanced Practice Providers on your designated Care Team:   Murray Hodgkins, NP Christell Faith, PA-C . Marrianne Mood, PA-C

## 2019-07-12 NOTE — Progress Notes (Signed)
Cardiology Office Note   Date:  07/12/2019   ID:  Matthew [name], DOB 06-17-1936, MRN [id number]  PCP:  Antionette Char, MD  Cardiologist:   [name], MD   Chief Complaint  Patient presents with  . Other    Patient denies chest pain and SOB at this time. Meds reviewed verbally with patient.       History of Present Illness: Matthew [name] is a 83 y.o. male who presents for a follow-up visit regarding coronary artery disease , aortic stenosis, chronic systolic heart failure and peripheral arterial disease. He is status post CABG twice in 1991 and 2004. He is status post bilateral common iliac artery kissing stent placement in 2015. He is status post TAVR in June 2017. Cardiac catheterization before that showed patent grafts. He developed left bundle branch block post TAVR and this correlated with a gradual decline in his ejection fraction.  Before TAVR, his EF was 55 to 60%.  Shortly after TAVR, his EF was 55%.  In 2018, his EF was noted to be 45 to 50% and then dropped to 30 to 35% in 2019.  He had worsening angina in 2019.  I proceeded with left heart catheterization in June which showed significant underlying three-vessel coronary artery disease with patent LIMA to LAD, patent RIMA to diagonal, patent SVG to OM 3 and patent SVG to right PDA.  The SVG to OM 2 was found to be occluded.  However, OM 2 had retrograde flow from SVG to 3.  There was possible left subclavian artery stenosis but it was not significant by gradient.  This was also interrogated by carotid Doppler and there was nothing to suggest significant left subclavian stenosis. He was noted to have frequent PVCs during cardiac catheterization but that has gradually improved. Holter monitor in June showed 5600 PVCs in 24 hours. We tried Ranexa for angina but he developed a rash with it.  He was hospitalized in October with GI bleed and blood loss anemia.  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  His  anemia improved with IV iron.  Most recent echocardiogram in March of this year showed an EF of 25 to 30%, mild aortic stenosis with mean gradient of 17 mmHg and mild to moderate aortic regurgitation. Due to continued decline in ejection fraction, he was switched to [facility] and referred to Dr. Caryl Comes to consider CRT given left bundle branch block.  The patient complained of worsening angina and thus there was concern about possible need of repeat cardiac catheterization before proceeding.  He was placed on Imdur 30 mg once daily and he reports almost complete resolution of  chest pain.  His blood pressure nonetheless continues to be elevated at home.  Past Medical History:  Diagnosis Date  . Alcohol abuse    stopped drinking 42 years ago from today 2020  . Anemia   . AS (aortic stenosis)    a. Echo 6/10: EF 55% mild AS; b. echo 06/2015; EF 55-60%, GR1DD, moderate AS, Peak velocity (S): 346 cm/s. Mean gradientS): 28 mm Hg. Peak gradient (S): 48 mm Hg. Valve area (VTI): 1.18 cm2;   c. Echo 4/17 - mild LVH, EF 55-60%, no RWMA, Gr 1 DD, mod to severe AS (mean 28 mmHg, peak 46 mmHg), mild LAE  . CAD (coronary artery disease)    a. MI 1996 w/ CABG 1996; b.redo CABG 06/2003; c. Myoview 7/09: EF 54% inferobasal infarct, no ischemia. Myoview 6/10 EF  43% inf wall infarct. no ischemia; c. cath 8/16 s/p DES to VG-OM2. OTW 3VD w/ patent VG->RPDA, VG->OM3,  & LIMA->LAD.  EF 55-65%.  . Carotid bruit    2009 0-39% on dopplers bilatrally  . Chest pain   . GERD (gastroesophageal reflux disease)   . History of kidney stones   . HOH (hard of hearing)   . HTN (hypertension)   . Hyperlipidemia   . Myocardial infarction ([place]) 1996  . Nephrolithiasis   . OSA (obstructive sleep apnea)   . PAD (peripheral artery disease) ([place]) 02/2014   Subtotal occlusion of right common iliac artery and 70% stenosis in the left common iliac artery. Status post bilateral kissing stent placement. Significant post stenosis aneurysmal  dilatation on the right side (any future catheterization through the right femoral artery should be done cautiously to avoid advancing the wire behind the stent struts)  . Shortness of breath    with exertion  . Thrombocytopenia ([place])     Past Surgical History:  Procedure Laterality Date  . ABDOMINAL AORTAGRAM N/A 03/06/2014   Procedure: ABDOMINAL Maxcine Ham;  Surgeon: Wellington Hampshire, MD;  Location: [facility] CATH LAB;  Service: Cardiovascular;  Laterality: N/A;  . CARDIAC CATHETERIZATION  02/2014   Severe three-vessel coronary artery disease with patent grafts.   Marland Kitchen CARDIAC CATHETERIZATION N/A 07/31/2015   Procedure: Right/Left Heart Cath and Coronary/Graft Angiography;  Surgeon: Wellington Hampshire, MD;  Location: [place] CV LAB;  Service: Cardiovascular;  Laterality: N/A;  . CARDIAC CATHETERIZATION N/A 07/31/2015   Procedure: Coronary Stent Intervention;  Surgeon: Wellington Hampshire, MD;  Location: [place] CV LAB;  Service: Cardiovascular;  Laterality: N/A;  . CARDIAC CATHETERIZATION N/A 05/26/2016   Procedure: Right/Left Heart Cath and Coronary Angiography;  Surgeon: Wellington Hampshire, MD;  Location: [place] CV LAB;  Service: Cardiovascular;  Laterality: N/A;  . COLONOSCOPY WITH PROPOFOL N/A 10/07/2018   Procedure: COLONOSCOPY WITH PROPOFOL;  Surgeon: Lin Landsman, MD;  Location: [facility] ENDOSCOPY;  Service: Gastroenterology;  Laterality: N/A;  . COLONOSCOPY WITH PROPOFOL N/A 01/08/2019   Procedure: COLONOSCOPY WITH PROPOFOL;  Surgeon: Lin Landsman, MD;  Location: [facility] ENDOSCOPY;  Service: Gastroenterology;  Laterality: N/A;  . CORONARY ARTERY BYPASS GRAFT  1991   at [facility]. Redone 2004-3 vessels 1st time and 4 second time  . CYSTO     2/3 times for kidney stones  . ENTEROSCOPY N/A 12/25/2018   Procedure: ENTEROSCOPY Single balloon;  Surgeon: Lin Landsman, MD;  Location: [place];  Service: Gastroenterology;  Laterality: N/A;  . ESOPHAGOGASTRODUODENOSCOPY (EGD) WITH  PROPOFOL N/A 10/07/2018   Procedure: ESOPHAGOGASTRODUODENOSCOPY (EGD) WITH PROPOFOL;  Surgeon: Lin Landsman, MD;  Location: [facility] ENDOSCOPY;  Service: Gastroenterology;  Laterality: N/A;  . GIVENS CAPSULE STUDY N/A 12/22/2018   Procedure: GIVENS CAPSULE STUDY;  Surgeon: Lin Landsman, MD;  Location: [facility] ENDOSCOPY;  Service: Gastroenterology;  Laterality: N/A;  . INGUINAL HERNIA REPAIR  11/10/2011   Procedure: HERNIA REPAIR INGUINAL ADULT;  Surgeon: Donato Heinz;  Location: [facility] ORS;  Service: General;  Laterality: Right;  . LAPAROSCOPIC RIGHT COLECTOMY Right 02/09/2019   Procedure: LAPAROSCOPIC RIGHT COLECTOMY;  Surgeon: Jules Husbands, MD;  Location: [facility] ORS;  Service: General;  Laterality: Right;  . LEFT HEART CATH AND CORONARY ANGIOGRAPHY Left 05/15/2018   Procedure: LEFT HEART CATH AND CORONARY ANGIOGRAPHY;  Surgeon: Wellington Hampshire, MD;  Location: [place] CV LAB;  Service: Cardiovascular;  Laterality: Left;  . LEFT HEART CATHETERIZATION WITH CORONARY/GRAFT ANGIOGRAM  N/A 03/06/2014   Procedure: LEFT HEART CATHETERIZATION WITH Beatrix Fetters;  Surgeon: Wellington Hampshire, MD;  Location: [facility] CATH LAB;  Service: Cardiovascular;  Laterality: N/A;  . STOMACH SURGERY     removal of gastric ulcers  . TEE WITHOUT CARDIOVERSION N/A 06/01/2016   Procedure: TRANSESOPHAGEAL ECHOCARDIOGRAM (TEE);  Surgeon: Sherren Mocha, MD;  Location: [place];  Service: Open Heart Surgery;  Laterality: N/A;  . TRANSCATHETER AORTIC VALVE REPLACEMENT, TRANSFEMORAL N/A 06/01/2016   Procedure: TRANSCATHETER AORTIC VALVE REPLACEMENT, TRANSFEMORAL;  Surgeon: Sherren Mocha, MD;  Location: [place];  Service: Open Heart Surgery;  Laterality: N/A;     Current Outpatient Medications  Medication Sig Dispense Refill  . acetaminophen (TYLENOL) 325 MG tablet Take 650 mg by mouth daily as needed for mild pain or headache.     Marland Kitchen aspirin EC 81 MG tablet Take 1 tablet (81 mg total) by mouth daily. 90 tablet 3  .  atorvastatin (LIPITOR) 80 MG tablet TAKE 1 TABLET EVERY DAY  ( APPOINTMENT IS NEEDED  ) 90 tablet 0  . carvedilol (COREG) 3.125 MG tablet Take 1 tablet (3.125 mg total) by mouth 2 (two) times daily. 180 tablet 0  . Ferrous Sulfate (IRON) 325 (65 Fe) MG TABS Take 325 mg by mouth 2 (two) times a day.    . isosorbide mononitrate (IMDUR) 30 MG 24 hr tablet Take 1 tablet (30 mg total) by mouth daily. 30 tablet 3  . Multiple Vitamins-Minerals (PRESERVISION AREDS PO) Take 1 tablet by mouth daily.     . Omega-3 Fatty Acids (FISH OIL PO) Take 1 capsule by mouth daily.     . pantoprazole (PROTONIX) 40 MG tablet TAKE 1 TABLET (40 MG TOTAL) BY MOUTH DAILY. 90 tablet 3  . sacubitril-valsartan (ENTRESTO) 24-26 MG Take 1 tablet by mouth 2 (two) times daily. 180 tablet 3  . tamsulosin (FLOMAX) 0.4 MG CAPS capsule Take 1 capsule (0.4 mg total) by mouth daily. 90 capsule 1  . TURMERIC PO Take 1 capsule by mouth at bedtime.     . nitroGLYCERIN (NITROSTAT) 0.4 MG SL tablet Place 0.4 mg under the tongue every 5 (five) minutes as needed for chest pain.     No current facility-administered medications for this visit.     Allergies:   Ranexa [ranolazine er]    Social History:  The patient  reports that he quit smoking about 29 years ago. His smoking use included cigarettes. He has a 50.00 pack-year smoking history. He has never used smokeless tobacco. He reports that he does not drink alcohol or use drugs.   Family History:  The patient's family history includes Heart disease in his father and mother.    ROS:  Please see the history of present illness.   Otherwise, review of systems are positive for none.   All other systems are reviewed and negative.    PHYSICAL EXAM: VS:  BP (!) 150/60 (BP Location: Left Arm, Patient Position: Sitting, Cuff Size: Normal)   Pulse 60   Ht 5\' 9"  (1.753 m)   Wt 191 lb (86.6 kg)   BMI 28.21 kg/m  , BMI Body mass index is 28.21 kg/m. GEN: Well nourished, well developed, in no  acute distress  HEENT: normal  Neck: no JVD, carotid bruits, or masses Cardiac: RRR; no rubs, or gallops,no edema .  Fixed splitting of S2.  2/6 crescendo decrescendo aortic murmur which is early peaking. Respiratory:  clear to auscultation bilaterally, normal work of breathing GI: soft, nontender, nondistended, +  BS MS: no deformity or atrophy  Skin: warm and dry, no rash Neuro:  Strength and sensation are intact Psych: euthymic mood, full affect Vascular: Femoral pulses are normal bilaterally. Distal pulses are palpable.  EKG:  EKG is ordered today. The ekg ordered today demonstrates sinus rhythm with sinus arrhythmia and left bundle branch block.   Recent Labs: 10/02/2018: TSH 2.817 01/17/2019: ALT 17 02/10/2019: Magnesium 2.0 02/13/2019: BUN 11; Creatinine, Ser 0.90; Potassium 3.4; Sodium 140 06/04/2019: Hemoglobin 13.9; Platelets 88    Lipid Panel    Component Value Date/Time   CHOL 122 10/02/2018 0847   CHOL 160 04/11/2018 1451   TRIG 147 10/02/2018 0847   HDL 30 (L) 10/02/2018 0847   HDL 44 04/11/2018 1451   CHOLHDL 4.1 10/02/2018 0847   VLDL 29 10/02/2018 0847   LDLCALC 63 10/02/2018 0847   LDLCALC 63 04/11/2018 1451      Wt Readings from Last 3 Encounters:  07/12/19 191 lb (86.6 kg)  06/28/19 193 lb (87.5 kg)  06/04/19 194 lb 6.4 oz (88.2 kg)        ASSESSMENT AND PLAN:  1.  S/p TAVR  for severe symptomatic aortic stenosis: Most recent echocardiogram in March showed mildly elevated gradient at 17 mmHg with mild to moderate aortic regurgitation.  Continue to monitor clinically and consider repeat echocardiogram after CRT.  2. Coronary artery disease involving graft bypass with stable angina: He reports almost complete resolution of angina since he was started on small dose of Imdur.  Thus, no need for repeat coronary angiography.     3.  Chronic systolic heart failure: EF continued to drop and most recently was 25 to 30%: This is thought to be due to  development of left bundle branch block post TAVR.  The patient has been evaluated by Dr. Caryl Comes who agreed that CRT is indicated.   Given that his angina has resolved and no need for repeat cardiac catheterization, CRRT can be rescheduled.   In terms of his medications, I elected to increase Entresto to better control his blood pressure  4. Essential hypertension: Blood pressure has been elevated.  I increase Entresto as outlined above.  5. Peripheral arterial disease: No recurrent claudication since bilateral iliac stenting.   6. Hyperlipidemia: Continue high-dose atorvastatin with a target LDL of less than 70.  Most recent LDL was 63.   Disposition:   FU with me in 4 months  Signed,  [name], MD  07/12/2019 3:00 PM    [place]

## 2019-07-13 LAB — CBC WITH DIFFERENTIAL/PLATELET
Basophils Absolute: 0.1 10*3/uL (ref 0.0–0.2)
Basos: 1 %
EOS (ABSOLUTE): 0.1 10*3/uL (ref 0.0–0.4)
Eos: 2 %
Hematocrit: 43.7 % (ref 37.5–51.0)
Hemoglobin: 14.4 g/dL (ref 13.0–17.7)
Immature Grans (Abs): 0.1 10*3/uL (ref 0.0–0.1)
Immature Granulocytes: 1 %
Lymphocytes Absolute: 1.5 10*3/uL (ref 0.7–3.1)
Lymphs: 21 %
MCH: 30 pg (ref 26.6–33.0)
MCHC: 33 g/dL (ref 31.5–35.7)
MCV: 91 fL (ref 79–97)
Monocytes Absolute: 0.6 10*3/uL (ref 0.1–0.9)
Monocytes: 9 %
Neutrophils Absolute: 4.8 10*3/uL (ref 1.4–7.0)
Neutrophils: 66 %
Platelets: 107 10*3/uL — ABNORMAL LOW (ref 150–450)
RBC: 4.8 x10E6/uL (ref 4.14–5.80)
RDW: 14 % (ref 11.6–15.4)
WBC: 7.2 10*3/uL (ref 3.4–10.8)

## 2019-07-13 LAB — BASIC METABOLIC PANEL
BUN/Creatinine Ratio: 13 (ref 10–24)
BUN: 14 mg/dL (ref 8–27)
CO2: 23 mmol/L (ref 20–29)
Calcium: 9.3 mg/dL (ref 8.6–10.2)
Chloride: 104 mmol/L (ref 96–106)
Creatinine, Ser: 1.09 mg/dL (ref 0.76–1.27)
GFR calc Af Amer: 74 mL/min/{1.73_m2} (ref >59)
GFR calc non Af Amer: 64 mL/min/{1.73_m2} (ref >59)
Glucose: 96 mg/dL (ref 65–99)
Potassium: 3.9 mmol/L (ref 3.5–5.2)
Sodium: 141 mmol/L (ref 134–144)

## 2019-07-17 ENCOUNTER — Telehealth: Payer: Self-pay | Admitting: Internal Medicine

## 2019-07-17 NOTE — Telephone Encounter (Signed)
Please call patient regarding Covid  Testing.

## 2019-07-18 NOTE — Telephone Encounter (Signed)
My Chart message sent to the patient/ his daughter stating he will need to go tomorrow, Thursday 07/18/19 for his pre-procedure COVID swab to be done.  Any time between 12:30 pm- 2:30 pm, per PAT.

## 2019-07-19 ENCOUNTER — Other Ambulatory Visit
Admission: RE | Admit: 2019-07-19 | Discharge: 2019-07-19 | Disposition: A | Discharge status: Home / Self Care | Payer: Medicare HMO | Source: Ambulatory Visit | Attending: Internal Medicine | Admitting: Internal Medicine

## 2019-07-19 ENCOUNTER — Other Ambulatory Visit: Payer: Self-pay

## 2019-07-19 ENCOUNTER — Telehealth: Payer: Self-pay | Admitting: Internal Medicine

## 2019-07-19 DIAGNOSIS — Z01812 Encounter for preprocedural laboratory examination: Secondary | ICD-10-CM | POA: Diagnosis present

## 2019-07-19 DIAGNOSIS — Z20828 Contact with and (suspected) exposure to other viral communicable diseases: Secondary | ICD-10-CM | POA: Insufficient documentation

## 2019-07-19 NOTE — Telephone Encounter (Signed)
Scheduled

## 2019-07-19 NOTE — Telephone Encounter (Signed)
-----   Message from Emily Filbert, RN sent at 07/18/2019  8:51 AM EDT ----- Please call to arrange for a 10-14 day (from 07/23/19) wound check appt with the [facility] in [place].  He will also need a 91 day (from 8/10) appt with Dr. Caryl Comes (post Bi-V PPM implant)  Thanks!

## 2019-07-20 LAB — SARS CORONAVIRUS 2 (TAT 6-24 HRS): SARS Coronavirus 2: NEGATIVE

## 2019-07-23 ENCOUNTER — Other Ambulatory Visit: Payer: Self-pay

## 2019-07-23 ENCOUNTER — Ambulatory Visit (HOSPITAL_COMMUNITY): Payer: Medicare HMO

## 2019-07-23 ENCOUNTER — Ambulatory Visit (HOSPITAL_COMMUNITY)
Admission: RE | Admit: 2019-07-23 | Discharge: 2019-07-24 | Disposition: A | Discharge status: Home / Self Care | Payer: Medicare HMO | Attending: Internal Medicine | Admitting: Internal Medicine

## 2019-07-23 ENCOUNTER — Ambulatory Visit (HOSPITAL_COMMUNITY)
Admission: RE | Disposition: A | Discharge status: Home / Self Care | Payer: Medicare HMO | Source: Home / Self Care | Attending: Internal Medicine

## 2019-07-23 DIAGNOSIS — Z87891 Personal history of nicotine dependence: Secondary | ICD-10-CM | POA: Insufficient documentation

## 2019-07-23 DIAGNOSIS — J939 Pneumothorax, unspecified: Secondary | ICD-10-CM

## 2019-07-23 DIAGNOSIS — D649 Anemia, unspecified: Secondary | ICD-10-CM | POA: Diagnosis not present

## 2019-07-23 DIAGNOSIS — I25119 Atherosclerotic heart disease of native coronary artery with unspecified angina pectoris: Secondary | ICD-10-CM | POA: Insufficient documentation

## 2019-07-23 DIAGNOSIS — Z959 Presence of cardiac and vascular implant and graft, unspecified: Secondary | ICD-10-CM

## 2019-07-23 DIAGNOSIS — I495 Sick sinus syndrome: Secondary | ICD-10-CM

## 2019-07-23 DIAGNOSIS — K219 Gastro-esophageal reflux disease without esophagitis: Secondary | ICD-10-CM | POA: Insufficient documentation

## 2019-07-23 DIAGNOSIS — Z8249 Family history of ischemic heart disease and other diseases of the circulatory system: Secondary | ICD-10-CM | POA: Insufficient documentation

## 2019-07-23 DIAGNOSIS — I252 Old myocardial infarction: Secondary | ICD-10-CM | POA: Insufficient documentation

## 2019-07-23 DIAGNOSIS — Z952 Presence of prosthetic heart valve: Secondary | ICD-10-CM | POA: Diagnosis not present

## 2019-07-23 DIAGNOSIS — Z955 Presence of coronary angioplasty implant and graft: Secondary | ICD-10-CM | POA: Diagnosis not present

## 2019-07-23 DIAGNOSIS — E785 Hyperlipidemia, unspecified: Secondary | ICD-10-CM | POA: Insufficient documentation

## 2019-07-23 DIAGNOSIS — Z79899 Other long term (current) drug therapy: Secondary | ICD-10-CM | POA: Diagnosis not present

## 2019-07-23 DIAGNOSIS — I110 Hypertensive heart disease with heart failure: Secondary | ICD-10-CM | POA: Diagnosis not present

## 2019-07-23 DIAGNOSIS — I5022 Chronic systolic (congestive) heart failure: Secondary | ICD-10-CM | POA: Diagnosis not present

## 2019-07-23 DIAGNOSIS — G4733 Obstructive sleep apnea (adult) (pediatric): Secondary | ICD-10-CM | POA: Insufficient documentation

## 2019-07-23 DIAGNOSIS — I739 Peripheral vascular disease, unspecified: Secondary | ICD-10-CM | POA: Diagnosis not present

## 2019-07-23 DIAGNOSIS — Z7982 Long term (current) use of aspirin: Secondary | ICD-10-CM | POA: Diagnosis not present

## 2019-07-23 DIAGNOSIS — I255 Ischemic cardiomyopathy: Secondary | ICD-10-CM | POA: Insufficient documentation

## 2019-07-23 DIAGNOSIS — I447 Left bundle-branch block, unspecified: Secondary | ICD-10-CM | POA: Diagnosis not present

## 2019-07-23 HISTORY — PX: BIV PACEMAKER INSERTION CRT-P: EP1199

## 2019-07-23 LAB — SURGICAL PCR SCREEN
MRSA, PCR: NEGATIVE
Staphylococcus aureus: NEGATIVE

## 2019-07-23 SURGERY — BIV PACEMAKER INSERTION CRT-P

## 2019-07-23 MED ORDER — CEFAZOLIN SODIUM-DEXTROSE 1-4 GM/50ML-% IV SOLN
1.0000 g | Freq: Four times a day (QID) | INTRAVENOUS | Status: AC
  Administered 2019-07-23 – 2019-07-24 (×3): 1 g via INTRAVENOUS
  Filled 2019-07-23 (×3): qty 50

## 2019-07-23 MED ORDER — FENTANYL CITRATE (PF) 100 MCG/2ML IJ SOLN
INTRAMUSCULAR | Status: DC | PRN
  Administered 2019-07-23: 12.5 ug via INTRAVENOUS
  Administered 2019-07-23: 25 ug via INTRAVENOUS
  Administered 2019-07-23 (×2): 12.5 ug via INTRAVENOUS
  Administered 2019-07-23: 25 ug via INTRAVENOUS

## 2019-07-23 MED ORDER — ATORVASTATIN CALCIUM 80 MG PO TABS
80.0000 mg | ORAL_TABLET | Freq: Every day | ORAL | Status: DC
  Administered 2019-07-24: 80 mg via ORAL
  Filled 2019-07-23: qty 1

## 2019-07-23 MED ORDER — IOHEXOL 350 MG/ML SOLN
INTRAVENOUS | Status: DC | PRN
  Administered 2019-07-23: 10 mL via INTRAVENOUS
  Administered 2019-07-23: 5 mL via INTRAVENOUS
  Administered 2019-07-23: 10 mL via INTRAVENOUS

## 2019-07-23 MED ORDER — SODIUM CHLORIDE 0.9 % IV SOLN
80.0000 mg | INTRAVENOUS | Status: AC
  Administered 2019-07-23: 80 mg
  Filled 2019-07-23: qty 2

## 2019-07-23 MED ORDER — MUPIROCIN 2 % EX OINT
TOPICAL_OINTMENT | CUTANEOUS | Status: AC
  Administered 2019-07-23: 1
  Filled 2019-07-23: qty 22

## 2019-07-23 MED ORDER — MIDAZOLAM HCL 5 MG/5ML IJ SOLN
INTRAMUSCULAR | Status: AC
  Filled 2019-07-23: qty 5

## 2019-07-23 MED ORDER — SODIUM CHLORIDE 0.9 % IV SOLN
INTRAVENOUS | Status: DC
  Administered 2019-07-23: 06:00:00 via INTRAVENOUS

## 2019-07-23 MED ORDER — ASPIRIN EC 81 MG PO TBEC
81.0000 mg | DELAYED_RELEASE_TABLET | Freq: Every day | ORAL | Status: DC
  Administered 2019-07-23 – 2019-07-24 (×2): 81 mg via ORAL
  Filled 2019-07-23 (×2): qty 1

## 2019-07-23 MED ORDER — ISOSORBIDE MONONITRATE ER 30 MG PO TB24
30.0000 mg | ORAL_TABLET | Freq: Every day | ORAL | Status: DC
  Administered 2019-07-24: 30 mg via ORAL
  Filled 2019-07-23: qty 1

## 2019-07-23 MED ORDER — HEPARIN (PORCINE) IN NACL 2-0.9 UNITS/ML
INTRAMUSCULAR | Status: AC | PRN
  Administered 2019-07-23: 500 mL

## 2019-07-23 MED ORDER — NITROGLYCERIN 0.4 MG SL SUBL: 0.4000 mg | SUBLINGUAL_TABLET | SUBLINGUAL | Status: DC | PRN

## 2019-07-23 MED ORDER — MIDAZOLAM HCL 5 MG/5ML IJ SOLN
INTRAMUSCULAR | Status: DC | PRN
  Administered 2019-07-23 (×2): 0.5 mg via INTRAVENOUS
  Administered 2019-07-23 (×3): 1 mg via INTRAVENOUS
  Administered 2019-07-23: 0.5 mg via INTRAVENOUS

## 2019-07-23 MED ORDER — SODIUM CHLORIDE 0.9 % IV SOLN
INTRAVENOUS | Status: DC | PRN
  Administered 2019-07-23 – 2019-07-24 (×2): 1000 mL via INTRAVENOUS

## 2019-07-23 MED ORDER — CEFAZOLIN SODIUM-DEXTROSE 2-4 GM/100ML-% IV SOLN
INTRAVENOUS | Status: AC
  Filled 2019-07-23: qty 100

## 2019-07-23 MED ORDER — ONDANSETRON HCL 4 MG/2ML IJ SOLN: 4.0000 mg | Freq: Four times a day (QID) | INTRAMUSCULAR | Status: DC | PRN

## 2019-07-23 MED ORDER — LIDOCAINE HCL 1 % IJ SOLN
INTRAMUSCULAR | Status: AC
  Filled 2019-07-23: qty 60

## 2019-07-23 MED ORDER — FENTANYL CITRATE (PF) 100 MCG/2ML IJ SOLN
INTRAMUSCULAR | Status: AC
  Filled 2019-07-23: qty 2

## 2019-07-23 MED ORDER — PANTOPRAZOLE SODIUM 40 MG PO TBEC
40.0000 mg | DELAYED_RELEASE_TABLET | Freq: Every day | ORAL | Status: DC
  Administered 2019-07-24: 08:00:00 40 mg via ORAL
  Filled 2019-07-23: qty 1

## 2019-07-23 MED ORDER — ACETAMINOPHEN 325 MG PO TABS
325.0000 mg | ORAL_TABLET | ORAL | Status: DC | PRN
  Administered 2019-07-23: 650 mg via ORAL
  Filled 2019-07-23: qty 2

## 2019-07-23 MED ORDER — CARVEDILOL 3.125 MG PO TABS
3.1250 mg | ORAL_TABLET | Freq: Two times a day (BID) | ORAL | Status: DC
  Administered 2019-07-23 – 2019-07-24 (×2): 3.125 mg via ORAL
  Filled 2019-07-23 (×2): qty 1

## 2019-07-23 MED ORDER — TAMSULOSIN HCL 0.4 MG PO CAPS
0.4000 mg | ORAL_CAPSULE | Freq: Every day | ORAL | Status: DC
  Administered 2019-07-24: 08:00:00 0.4 mg via ORAL
  Filled 2019-07-23: qty 1

## 2019-07-23 MED ORDER — CEFAZOLIN SODIUM-DEXTROSE 2-4 GM/100ML-% IV SOLN
2.0000 g | INTRAVENOUS | Status: AC
  Administered 2019-07-23: 2 g via INTRAVENOUS

## 2019-07-23 MED ORDER — SACUBITRIL-VALSARTAN 49-51 MG PO TABS
1.0000 | ORAL_TABLET | Freq: Two times a day (BID) | ORAL | Status: DC
  Administered 2019-07-23 – 2019-07-24 (×2): 1 via ORAL
  Filled 2019-07-23 (×2): qty 1

## 2019-07-23 MED ORDER — SODIUM CHLORIDE 0.9 % IV SOLN
INTRAVENOUS | Status: AC
  Filled 2019-07-23: qty 2

## 2019-07-23 MED ORDER — ACETAMINOPHEN 325 MG PO TABS: 650.0000 mg | ORAL_TABLET | Freq: Four times a day (QID) | ORAL | Status: DC | PRN

## 2019-07-23 MED ORDER — HEPARIN (PORCINE) IN NACL 1000-0.9 UT/500ML-% IV SOLN
INTRAVENOUS | Status: AC
  Filled 2019-07-23: qty 500

## 2019-07-23 MED ORDER — LIDOCAINE HCL (PF) 1 % IJ SOLN
INTRAMUSCULAR | Status: DC | PRN
  Administered 2019-07-23: 60 mL

## 2019-07-23 SURGICAL SUPPLY — 18 items
CABLE SURGICAL S-101-97-12 (CABLE) ×3 IMPLANT
CATH CPS DIRECT 135 DS2C020 (CATHETERS) ×2 IMPLANT
HEMOSTAT SURGICEL 2X4 FIBR (HEMOSTASIS) ×2 IMPLANT
KIT ESSENTIALS PG (KITS) ×2 IMPLANT
KIT MICROPUNCTURE NIT STIFF (SHEATH) ×2 IMPLANT
LEAD CAPSURE NOVUS 5076-52CM (Lead) ×2 IMPLANT
LEAD CAPSURE NOVUS 5076-58CM (Lead) ×2 IMPLANT
LEAD QUARTET 1458QL-86 (Lead) IMPLANT
PACEMAKER QUDR ALLR CRT PM3562 (Pacemaker) IMPLANT
PAD PRO RADIOLUCENT 2001M-C (PAD) ×3 IMPLANT
PMKR QUADRA ALLURE CRT PM3562 (Pacemaker) ×3 IMPLANT
QUARTET 1458QL-86 (Lead) ×3 IMPLANT
SHEATH 7FR PRELUDE SNAP 13 (SHEATH) ×4 IMPLANT
SHEATH 8FR PRELUDE SNAP 13 (SHEATH) ×2 IMPLANT
SHEATH CLASSIC 9.5F (SHEATH) ×2 IMPLANT
TRAY PACEMAKER INSERTION (PACKS) ×3 IMPLANT
WIRE ACUITY WHISPER EDS 4648 (WIRE) ×2 IMPLANT
WIRE HI TORQ VERSACORE-J 145CM (WIRE) ×2 IMPLANT

## 2019-07-23 NOTE — Progress Notes (Signed)
Called for stat PCXR to cath holding bay 5

## 2019-07-23 NOTE — Interval H&P Note (Signed)
History and Physical Interval Note:  07/23/2019 7:39 AM  Matthew [name]  has presented today for surgery, with the diagnosis of heart failure.  The various methods of treatment have been discussed with the patient and family. After consideration of risks, benefits and other options for treatment, the patient has consented to  Procedure(s): BIV PACEMAKER INSERTION CRT-P (N/A) as a surgical intervention.  The patient's history has been reviewed, patient examined, no change in status, stable for surgery.  I have reviewed the patient's chart and labs.  Questions were answered to the patient's satisfaction.     Virl Axe  Pt seen and examined,  No interval changes Reviewed plan  All questions answered

## 2019-07-23 NOTE — Progress Notes (Signed)
To my eye  Repeat CXR shows no PTX

## 2019-07-23 NOTE — Progress Notes (Signed)
THERE WAS POSSIBLE ASPIRATION OF AIR  HENCE CXR post procedure is pending at this time  Also because of the difficulties of gaining access, a venogram was undrertaken

## 2019-07-23 NOTE — Discharge Summary (Addendum)
ELECTROPHYSIOLOGY PROCEDURE DISCHARGE SUMMARY    Patient ID: Matthew [name],  MRN: [id number], DOB/AGE: 81-Sep-1937 81 y.o.  Admit date: 07/23/2019 Discharge date: 07/24/2019  Primary Care Physician: Antionette Char, MD  Primary Cardiologist: Dr. Fletcher Anon Electrophysiologist: Dr. Caryl Comes  Primary Discharge Diagnosis:  1. ICM 2. LBBB  Secondary Discharge Diagnosis:  1. CAD 2. Chronic CHF (systolic) 3. VHD     H/o TAVR 4. PVD    Allergies  Allergen Reactions  . Ranexa [Ranolazine Er] Rash     Procedures This Admission:  1.  Implantation of a SJM CRT-PPM on 07/23/2019 by Dr Caryl Comes.  The patient received a St Jude  pulse generator serial number [id number], Medtronic MRI compatible 5076 ventricular lead serial number [id number], St Jude 1458 QL lead SN [id number] ([id number]), Medtronic MRI compatible 5076 atrial lead serial number [id number]  There were no immediate post procedure complications, there was some concern of [possible ptx with difficult access and aspiration of air, stat post procedure CXR was with NO ptx   2.  CXR on 07/24/2019 demonstrated no pneumothorax status post device implantation.    Brief HPI: Matthew [name] is a 83 y.o. male was referred to electrophysiology in the outpatient setting for consideration of PPM implantation.  Past medical history includes above.  The patient was recommended to undergo CRT therapy for CHF/ICM, LBBB.    Risks, benefits, and alternatives to PPM implantation were reviewed with the patient who wished to proceed.   Hospital Course:  The patient was admitted and underwent implantation of a CRT-P with details as outlined above.  He was monitored on telemetry overnight which demonstrated AVP aced, occ PVCs.  Left chest was without hematoma or ecchymosis.  The device was interrogated and found to be functioning normally.  CXR was obtained and demonstrated no pneumothorax status post device implantation, no SOB , no abnormal pulmonary findings on exam.   Wound care, arm mobility, and restrictions were reviewed with the patient.  The patient feels well, no CP or SOB, he was examined by Dr. Caryl Comes and considered stable for discharge to home.   We will up-titrate his coreg   Physical Exam: Vitals:   07/23/19 1917 07/24/19 0603 07/24/19 0639 07/24/19 0815  BP: (!) 155/61 (!) 172/60  (!) 174/75  Pulse: 63 60  70  Resp: 18 20    Temp: 98 F (36.7 C) 98.8 F (37.1 C)    TempSrc: Oral Oral    SpO2: 95% 96%  98%  Weight:   86 kg   Height:        GEN- The patient is well appearing, alert and oriented x 3 today.   HEENT: normocephalic, atraumatic; sclera clear, conjunctiva pink; hearing intact; oropharynx clear; neck supple, no JVP Lungs- CTATA b/l, normal work of breathing.  No wheezes, rales, rhonchi Heart- RRR, no murmurs, rubs or gallops, PMI not laterally displaced GI- soft, non-tender, non-distended Extremities- no clubbing, cyanosis, or edema MS- no significant deformity or atrophy Skin- warm and dry, no rash or lesion, left chest without hematoma/ecchymosis Psych- euthymic mood, full affect Neuro- no gross deficits   Labs:   Lab Results  Component Value Date   WBC 7.2 07/12/2019   HGB 14.4 07/12/2019   HCT 43.7 07/12/2019   MCV 91 07/12/2019   PLT 107 (L) 07/12/2019   No results for input(s): NA, K, CL, CO2, BUN, CREATININE, CALCIUM, PROT, BILITOT, ALKPHOS, ALT, AST, GLUCOSE in the last 168 hours.  Invalid input(s): LABALBU  Discharge Medications:  Allergies as of 07/24/2019      Reactions   Ranexa [ranolazine Er] Rash      Medication List    TAKE these medications   acetaminophen 325 MG tablet Commonly known as: TYLENOL Take 650 mg by mouth every 6 (six) hours as needed for mild pain or headache.   aspirin EC 81 MG tablet Take 1 tablet (81 mg total) by mouth daily.   atorvastatin 80 MG tablet Commonly known as: LIPITOR TAKE 1 TABLET EVERY DAY  ( APPOINTMENT IS NEEDED  ) What changed: See the new  instructions.   carvedilol 6.25 MG tablet Commonly known as: Coreg Take 1 tablet (6.25 mg total) by mouth 2 (two) times daily. What changed:   medication strength  how much to take   Entresto 49-51 MG Generic drug: sacubitril-valsartan Take 1 tablet by mouth 2 (two) times daily.   FISH OIL PO Take 1 capsule by mouth daily.   isosorbide mononitrate 30 MG 24 hr tablet Commonly known as: IMDUR Take 1 tablet (30 mg total) by mouth daily.   nitroGLYCERIN 0.4 MG SL tablet Commonly known as: NITROSTAT Place 0.4 mg under the tongue every 5 (five) minutes as needed for chest pain.   pantoprazole 40 MG tablet Commonly known as: PROTONIX TAKE 1 TABLET (40 MG TOTAL) BY MOUTH DAILY.   PRESERVISION AREDS PO Take 1 tablet by mouth daily.   tamsulosin 0.4 MG Caps capsule Commonly known as: FLOMAX Take 1 capsule (0.4 mg total) by mouth daily.   TURMERIC PO Take 1 capsule by mouth at bedtime.   Visine 0.05 % ophthalmic solution Generic drug: tetrahydrozoline Place 2 drops into both eyes 4 (four) times daily as needed (for dry eyes).       Disposition:  Discharge Instructions    Diet - low sodium heart healthy   Complete by: As directed    Increase activity slowly   Complete by: As directed      Follow-up Information    [facility] Follow up.   Specialty: Cardiology Why: 08/02/2019 @ 3:30PM, wound check visit Contact information: [address], [address] [place]       Deboraha Sprang, MD Follow up.   Specialty: Cardiology Why: 10/30/2019 @ 3:30PM Contact information: [address] [address] [phone]           Duration of Discharge Encounter: Greater than 30 minutes including physician time.  Signed, Tommye Standard, PA-C 07/24/2019 9:14 AM  Pt seen and examined,  cXR without ptx Instructions given Increase carvedilol with elevated BP and now with HR support

## 2019-07-23 NOTE — Progress Notes (Signed)
CXR without pneumothorax  Will repeat in about 4 hrs

## 2019-07-24 ENCOUNTER — Encounter (HOSPITAL_COMMUNITY): Payer: Self-pay | Admitting: Internal Medicine

## 2019-07-24 ENCOUNTER — Ambulatory Visit (HOSPITAL_COMMUNITY): Payer: Medicare HMO

## 2019-07-24 DIAGNOSIS — I5022 Chronic systolic (congestive) heart failure: Secondary | ICD-10-CM | POA: Diagnosis not present

## 2019-07-24 DIAGNOSIS — I739 Peripheral vascular disease, unspecified: Secondary | ICD-10-CM | POA: Diagnosis not present

## 2019-07-24 DIAGNOSIS — I447 Left bundle-branch block, unspecified: Secondary | ICD-10-CM | POA: Diagnosis not present

## 2019-07-24 DIAGNOSIS — I110 Hypertensive heart disease with heart failure: Secondary | ICD-10-CM | POA: Diagnosis not present

## 2019-07-24 DIAGNOSIS — I495 Sick sinus syndrome: Secondary | ICD-10-CM | POA: Diagnosis not present

## 2019-07-24 MED ORDER — CARVEDILOL 6.25 MG PO TABS: 6.2500 mg | ORAL_TABLET | Freq: Two times a day (BID) | ORAL | 11 refills | Status: DC

## 2019-07-24 MED FILL — Lidocaine HCl Local Inj 1%: INTRAMUSCULAR | Qty: 60 | Status: AC

## 2019-07-24 NOTE — Progress Notes (Signed)
Orthopedic Tech Progress Note Patient Details:  Matthew [name] [birth year] [phone] RN said patient has arm sling Patient ID: Matthew [name], male   DOB: [birth year], 83 y.o.   MRN: [id number]   [name] 07/24/2019, 7:32 AM

## 2019-07-24 NOTE — Progress Notes (Signed)
Pt has orders to be discharged. Discharge instructions given and pt has no additional questions at this time. Medication regimen reviewed and pt educated. Pt verbalized understanding and has no additional questions. Telemetry box removed. IV removed and site in good condition. Pt stable and waiting for transportation. 

## 2019-07-24 NOTE — Discharge Instructions (Signed)
° ° °  Supplemental Discharge Instructions for  Pacemaker/Defibrillator Patients  Activity No heavy lifting or vigorous activity with your left/right arm for 6 to 8 weeks.  Do not raise your left/right arm above your head for one week.  Gradually raise your affected arm as drawn below.              07/27/2019                 07/28/2019               07/29/2019               07/30/2019 __  NO DRIVING for  1 week   ; you may begin driving on   0/35/2481  .  WOUND CARE - Keep the wound area clean and dry.  Do not get this area wet for 24 hours. No showers for one week; you may shower on  07/25/2019   . - The tape/steri-strips on your wound will fall off; do not pull them off.  No bandage is needed on the site.  DO  NOT apply any creams, oils, or ointments to the wound area. - If you notice any drainage or discharge from the wound, any swelling or bruising at the site, or you develop a fever > 101? F after you are discharged home, call the office at once.  Special Instructions - You are still able to use cellular telephones; use the ear opposite the side where you have your pacemaker/defibrillator.  Avoid carrying your cellular phone near your device. - When traveling through airports, show security personnel your identification card to avoid being screened in the metal detectors.  Ask the security personnel to use the hand wand. - Avoid arc welding equipment, MRI testing (magnetic resonance imaging), TENS units (transcutaneous nerve stimulators).  Call the office for questions about other devices. - Avoid electrical appliances that are in poor condition or are not properly grounded. - Microwave ovens are safe to be near or to operate.

## 2019-07-25 NOTE — Telephone Encounter (Signed)
Spoke with Matthew [name] (DPR). Clarified activity and driving restrictions until wound check appointment on 07/23/19. Matthew [name] verbalizes understanding and thanked me for my call. No further questions at this time.

## 2019-07-31 ENCOUNTER — Encounter (HOSPITAL_COMMUNITY): Payer: Self-pay | Admitting: Internal Medicine

## 2019-08-02 ENCOUNTER — Other Ambulatory Visit: Payer: Self-pay

## 2019-08-02 ENCOUNTER — Ambulatory Visit (INDEPENDENT_AMBULATORY_CARE_PROVIDER_SITE_OTHER): Payer: Medicare HMO | Admitting: Student

## 2019-08-02 DIAGNOSIS — I255 Ischemic cardiomyopathy: Secondary | ICD-10-CM

## 2019-08-02 DIAGNOSIS — I447 Left bundle-branch block, unspecified: Secondary | ICD-10-CM

## 2019-08-02 LAB — CUP PACEART INCLINIC DEVICE CHECK
Battery Remaining Longevity: 46 mo
Battery Voltage: 3.01 V
Brady Statistic RA Percent Paced: 87 %
Brady Statistic RV Percent Paced: 93 %
Date Time Interrogation Session: 20200820161141
Implantable Lead Implant Date: 20200810
Implantable Lead Implant Date: 20200810
Implantable Lead Implant Date: 20200810
Implantable Lead Location: 753858
Implantable Lead Location: 753859
Implantable Lead Location: 753860
Implantable Lead Model: 5076
Implantable Lead Model: 5076
Implantable Pulse Generator Implant Date: 20200810
Lead Channel Impedance Value: 362.5 Ohm
Lead Channel Impedance Value: 462.5 Ohm
Lead Channel Impedance Value: 625 Ohm
Lead Channel Pacing Threshold Amplitude: 1 V
Lead Channel Pacing Threshold Amplitude: 1 V
Lead Channel Pacing Threshold Amplitude: 1.25 V
Lead Channel Pacing Threshold Amplitude: 1.25 V
Lead Channel Pacing Threshold Amplitude: 1.5 V
Lead Channel Pacing Threshold Amplitude: 1.5 V
Lead Channel Pacing Threshold Pulse Width: 0.4 ms
Lead Channel Pacing Threshold Pulse Width: 0.4 ms
Lead Channel Pacing Threshold Pulse Width: 0.4 ms
Lead Channel Pacing Threshold Pulse Width: 0.4 ms
Lead Channel Pacing Threshold Pulse Width: 1 ms
Lead Channel Pacing Threshold Pulse Width: 1 ms
Lead Channel Sensing Intrinsic Amplitude: 1.1 mV
Lead Channel Sensing Intrinsic Amplitude: 12 mV
Lead Channel Setting Pacing Amplitude: 2 V
Lead Channel Setting Pacing Amplitude: 3.5 V
Lead Channel Setting Pacing Amplitude: 3.5 V
Lead Channel Setting Pacing Pulse Width: 0.4 ms
Lead Channel Setting Pacing Pulse Width: 1 ms
Lead Channel Setting Sensing Sensitivity: 2 mV
Pulse Gen Serial Number: 9130392

## 2019-08-02 NOTE — Progress Notes (Signed)
Wound check appointment. Steri-strips removed. Wound without redness or edema. Incision edges approximated, wound well healed. Normal device function. Thresholds, sensing, and impedances consistent with implant measurements. Device programmed at 3.5 V/Appropriate safety margin until 3 month visit. Histogram distribution appropriate for patient and level of activity. Pt had 1 episode of likely AT noted (10 seconds). No high ventricular rates noted. Patient educated about wound care, arm mobility, lifting restrictions. ROV in 3 months with Dr. Caryl Comes.

## 2019-08-28 NOTE — Telephone Encounter (Signed)
Patients daughter calling in to follow up on the mychart message she sent yesterday. She would like a call back at [phone]

## 2019-09-04 ENCOUNTER — Inpatient Hospital Stay: Payer: Medicare HMO | Attending: Oncology

## 2019-09-04 ENCOUNTER — Other Ambulatory Visit: Payer: Medicare HMO

## 2019-09-04 ENCOUNTER — Other Ambulatory Visit: Payer: Self-pay

## 2019-09-04 DIAGNOSIS — Q2733 Arteriovenous malformation of digestive system vessel: Secondary | ICD-10-CM | POA: Insufficient documentation

## 2019-09-04 DIAGNOSIS — D696 Thrombocytopenia, unspecified: Secondary | ICD-10-CM | POA: Diagnosis not present

## 2019-09-04 DIAGNOSIS — D508 Other iron deficiency anemias: Secondary | ICD-10-CM | POA: Diagnosis present

## 2019-09-04 DIAGNOSIS — D500 Iron deficiency anemia secondary to blood loss (chronic): Secondary | ICD-10-CM

## 2019-09-04 LAB — CBC
HCT: 39.1 % (ref 39.0–52.0)
Hemoglobin: 13 g/dL (ref 13.0–17.0)
MCH: 31.3 pg (ref 26.0–34.0)
MCHC: 33.2 g/dL (ref 30.0–36.0)
MCV: 94 fL (ref 80.0–100.0)
Platelets: 109 10*3/uL — ABNORMAL LOW (ref 150–400)
RBC: 4.16 MIL/uL — ABNORMAL LOW (ref 4.22–5.81)
RDW: 13 % (ref 11.5–15.5)
WBC: 6.8 10*3/uL (ref 4.0–10.5)
nRBC: 0 % (ref 0.0–0.2)

## 2019-09-04 LAB — IRON AND TIBC
Iron: 78 ug/dL (ref 45–182)
Saturation Ratios: 31 % (ref 17.9–39.5)
TIBC: 256 ug/dL (ref 250–450)
UIBC: 178 ug/dL

## 2019-09-04 LAB — FERRITIN: Ferritin: 145 ng/mL (ref 24–336)

## 2019-09-21 ENCOUNTER — Encounter: Payer: Self-pay | Admitting: Oncology

## 2019-09-26 ENCOUNTER — Other Ambulatory Visit: Payer: Self-pay

## 2019-09-26 MED ORDER — ATORVASTATIN CALCIUM 80 MG PO TABS: 80.0000 mg | ORAL_TABLET | Freq: Every day | ORAL | 2 refills | Status: DC

## 2019-10-29 ENCOUNTER — Telehealth: Payer: Self-pay | Admitting: Internal Medicine

## 2019-10-29 NOTE — Telephone Encounter (Signed)
Routed to Dr. Aquilla Hacker RN, [place].

## 2019-10-29 NOTE — Telephone Encounter (Signed)
New message     Called to confirm tomorrow's appt with Dr Caryl Comes.  Patient's daughter, [name], said that she needed to come to the appt with her father.  If ok, please write it in appt notes.

## 2019-10-30 ENCOUNTER — Encounter: Payer: Self-pay | Admitting: Internal Medicine

## 2019-10-30 ENCOUNTER — Other Ambulatory Visit: Payer: Self-pay

## 2019-10-30 ENCOUNTER — Ambulatory Visit (INDEPENDENT_AMBULATORY_CARE_PROVIDER_SITE_OTHER): Payer: Medicare HMO | Admitting: *Deleted

## 2019-10-30 ENCOUNTER — Ambulatory Visit (INDEPENDENT_AMBULATORY_CARE_PROVIDER_SITE_OTHER): Payer: Medicare HMO | Admitting: Internal Medicine

## 2019-10-30 VITALS — BP 136/82 | HR 75 | Ht 69.0 in | Wt 193.8 lb

## 2019-10-30 DIAGNOSIS — I359 Nonrheumatic aortic valve disorder, unspecified: Secondary | ICD-10-CM | POA: Diagnosis not present

## 2019-10-30 DIAGNOSIS — I447 Left bundle-branch block, unspecified: Secondary | ICD-10-CM | POA: Diagnosis not present

## 2019-10-30 DIAGNOSIS — I255 Ischemic cardiomyopathy: Secondary | ICD-10-CM | POA: Diagnosis not present

## 2019-10-30 DIAGNOSIS — I5022 Chronic systolic (congestive) heart failure: Secondary | ICD-10-CM

## 2019-10-30 DIAGNOSIS — Z950 Presence of cardiac pacemaker: Secondary | ICD-10-CM | POA: Diagnosis not present

## 2019-10-30 LAB — CUP PACEART INCLINIC DEVICE CHECK
Battery Remaining Longevity: 68 mo
Battery Voltage: 2.98 V
Brady Statistic RA Percent Paced: 87 %
Brady Statistic RV Percent Paced: 93 %
Date Time Interrogation Session: 20201117192250
Implantable Lead Implant Date: 20200810
Implantable Lead Implant Date: 20200810
Implantable Lead Implant Date: 20200810
Implantable Lead Location: 753858
Implantable Lead Location: 753859
Implantable Lead Location: 753860
Implantable Lead Model: 5076
Implantable Lead Model: 5076
Implantable Pulse Generator Implant Date: 20200810
Lead Channel Impedance Value: 412.5 Ohm
Lead Channel Impedance Value: 462.5 Ohm
Lead Channel Impedance Value: 537.5 Ohm
Lead Channel Pacing Threshold Amplitude: 0.75 V
Lead Channel Pacing Threshold Amplitude: 0.75 V
Lead Channel Pacing Threshold Amplitude: 1 V
Lead Channel Pacing Threshold Pulse Width: 0.4 ms
Lead Channel Pacing Threshold Pulse Width: 0.4 ms
Lead Channel Pacing Threshold Pulse Width: 1 ms
Lead Channel Sensing Intrinsic Amplitude: 1.8 mV
Lead Channel Sensing Intrinsic Amplitude: 12 mV
Lead Channel Setting Pacing Amplitude: 2 V
Lead Channel Setting Pacing Amplitude: 2 V
Lead Channel Setting Pacing Amplitude: 2 V
Lead Channel Setting Pacing Pulse Width: 0.4 ms
Lead Channel Setting Pacing Pulse Width: 1 ms
Lead Channel Setting Sensing Sensitivity: 2 mV
Pulse Gen Serial Number: 9130392

## 2019-10-30 NOTE — Patient Instructions (Signed)
Medication Instructions:   *If you need a refill on your cardiac medications before your next appointment, please call your pharmacy*  Lab Work:  If you have labs (blood work) drawn today and your tests are completely normal, you will receive your results only by: Marland Kitchen MyChart Message (if you have MyChart) OR . A paper copy in the mail If you have any lab test that is abnormal or we need to change your treatment, we will call you to review the results.  Testing/Procedures:   Follow-Up: At [facility], you and your health needs are our priority.  As part of our continuing mission to provide you with exceptional heart care, we have created designated Provider Care Teams.  These Care Teams include your primary Cardiologist (physician) and Advanced Practice Providers (APPs -  Physician Assistants and Nurse Practitioners) who all work together to provide you with the care you need, when you need it.  Your next appointment:   3 month(s)  The format for your next appointment:   In Person  Provider:   Virl Axe, MD  Other Instructions

## 2019-10-30 NOTE — Progress Notes (Signed)
Patient Care Team: Antionette Char, MD as PCP - General (Family Medicine) Wellington Hampshire, MD as PCP - Cardiology (Cardiology) Wellington Hampshire, MD as Consulting Physician (Cardiology) Gala Romney Cristopher Estimable, MD as Consulting Physician (Gastroenterology)   HPI  Matthew [name] is a 83 y.o. male seen in follow-up for pacemaker implanted for new onset LV dysfunction following TAVR complicated by left bundle branch block.  He also had PVCs albeit they enter percentage of less than 5%.  Breathing got better following CRT-P implantation.  Over the last month or so, he has noted worsening dyspnea again.  Denies nocturnal dyspnea although he is on CPAP, abdominal distention or peripheral edema.  No new medications.  CAD with prior bypass surgery in 1991 in 2004; TAVR6/17.   DATE TEST EF   4/17 Echo  55-60%   6/19 LHC % LIMA-LAD, RIMA-D, SVG-OM3, SVG R/PD patent SVG -OM2 occluded  3/20 Echo  25-30 % LAE ( -/6.6/55)        Date Cr K Hgb Plt  3/20 0.9 3.4 10.9 109 (4/20)  6/20   13.9   9/20 1.09 (7/20) 3.9(7/20) 13.0      Date PVCs  6/19  6%  4/20 4.7  11/20 (PM)      Records and Results Reviewed   Past Medical History:  Diagnosis Date  . Alcohol abuse    stopped drinking 42 years ago from today 2020  . Anemia   . AS (aortic stenosis)    a. Echo 6/10: EF 55% mild AS; b. echo 06/2015; EF 55-60%, GR1DD, moderate AS, Peak velocity (S): 346 cm/s. Mean gradientS): 28 mm Hg. Peak gradient (S): 48 mm Hg. Valve area (VTI): 1.18 cm2;   c. Echo 4/17 - mild LVH, EF 55-60%, no RWMA, Gr 1 DD, mod to severe AS (mean 28 mmHg, peak 46 mmHg), mild LAE  . CAD (coronary artery disease)    a. MI 1996 w/ CABG 1996; b.redo CABG 06/2003; c. Myoview 7/09: EF 54% inferobasal infarct, no ischemia. Myoview 6/10 EF 43% inf wall infarct. no ischemia; c. cath 8/16 s/p DES to VG-OM2. OTW 3VD w/ patent VG->RPDA, VG->OM3,  & LIMA->LAD.  EF 55-65%.  . Carotid bruit    2009 0-39% on dopplers  bilatrally  . Chest pain   . GERD (gastroesophageal reflux disease)   . History of kidney stones   . HOH (hard of hearing)   . HTN (hypertension)   . Hyperlipidemia   . Myocardial infarction ([place]) 1996  . Nephrolithiasis   . OSA (obstructive sleep apnea)   . PAD (peripheral artery disease) ([place]) 02/2014   Subtotal occlusion of right common iliac artery and 70% stenosis in the left common iliac artery. Status post bilateral kissing stent placement. 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 Shortness of breath    with exertion  . Thrombocytopenia ([place])     Past Surgical History:  Procedure Laterality Date  . ABDOMINAL AORTAGRAM N/A 03/06/2014   Procedure: ABDOMINAL Maxcine Ham;  Surgeon: Wellington Hampshire, MD;  Location: [place] CATH LAB;  Service: Cardiovascular;  Laterality: N/A;  . BIV PACEMAKER INSERTION CRT-P N/A 07/23/2019   Procedure: BIV PACEMAKER INSERTION CRT-P;  Surgeon: Deboraha Sprang, MD;  Location: [place] CV LAB;  Service: Cardiovascular;  Laterality: N/A;  . CARDIAC CATHETERIZATION  02/2014  Severe three-vessel coronary artery disease with patent grafts.   Marland Kitchen CARDIAC CATHETERIZATION N/A 07/31/2015   Procedure: Right/Left Heart Cath and Coronary/Graft Angiography;  Surgeon: Wellington Hampshire, MD;  Location: [place] CV LAB;  Service: Cardiovascular;  Laterality: N/A;  . CARDIAC CATHETERIZATION N/A 07/31/2015   Procedure: Coronary Stent Intervention;  Surgeon: Wellington Hampshire, MD;  Location: [place] CV LAB;  Service: Cardiovascular;  Laterality: N/A;  . CARDIAC CATHETERIZATION N/A 05/26/2016   Procedure: Right/Left Heart Cath and Coronary Angiography;  Surgeon: Wellington Hampshire, MD;  Location: [place] CV LAB;  Service: Cardiovascular;  Laterality: N/A;  . COLONOSCOPY WITH PROPOFOL N/A 10/07/2018    Procedure: COLONOSCOPY WITH PROPOFOL;  Surgeon: Lin Landsman, MD;  Location: [facility] ENDOSCOPY;  Service: Gastroenterology;  Laterality: N/A;  . COLONOSCOPY WITH PROPOFOL N/A 01/08/2019   Procedure: COLONOSCOPY WITH PROPOFOL;  Surgeon: Lin Landsman, MD;  Location: [facility] ENDOSCOPY;  Service: Gastroenterology;  Laterality: N/A;  . CORONARY ARTERY BYPASS GRAFT  1991   at [facility]. Redone 2004-3 vessels 1st time and 4 second time  . CYSTO     2/3 times for kidney stones  . ENTEROSCOPY N/A 12/25/2018   Procedure: ENTEROSCOPY Single balloon;  Surgeon: Lin Landsman, MD;  Location: [place];  Service: Gastroenterology;  Laterality: N/A;  . ESOPHAGOGASTRODUODENOSCOPY (EGD) WITH PROPOFOL N/A 10/07/2018   Procedure: ESOPHAGOGASTRODUODENOSCOPY (EGD) WITH PROPOFOL;  Surgeon: Lin Landsman, MD;  Location: [facility] ENDOSCOPY;  Service: Gastroenterology;  Laterality: N/A;  . GIVENS CAPSULE STUDY N/A 12/22/2018   Procedure: GIVENS CAPSULE STUDY;  Surgeon: Lin Landsman, MD;  Location: [facility] ENDOSCOPY;  Service: Gastroenterology;  Laterality: N/A;  . INGUINAL HERNIA REPAIR  11/10/2011   Procedure: HERNIA REPAIR INGUINAL ADULT;  Surgeon: Donato Heinz;  Location: [facility];  Service: General;  Laterality: Right;  . LAPAROSCOPIC RIGHT COLECTOMY Right 02/09/2019   Procedure: LAPAROSCOPIC RIGHT COLECTOMY;  Surgeon: Jules Husbands, MD;  Location: [facility] ORS;  Service: General;  Laterality: Right;  . LEFT HEART CATH AND CORONARY ANGIOGRAPHY Left 05/15/2018   Procedure: LEFT HEART CATH AND CORONARY ANGIOGRAPHY;  Surgeon: Wellington Hampshire, MD;  Location: [place] CV LAB;  Service: Cardiovascular;  Laterality: Left;  . LEFT HEART CATHETERIZATION WITH CORONARY/GRAFT ANGIOGRAM N/A 03/06/2014   Procedure: LEFT HEART CATHETERIZATION WITH Beatrix Fetters;  Surgeon: Wellington Hampshire, MD;  Location: [place] CATH LAB;  Service: Cardiovascular;  Laterality: N/A;  . STOMACH SURGERY     removal of gastric  ulcers  . TEE WITHOUT CARDIOVERSION N/A 06/01/2016   Procedure: TRANSESOPHAGEAL ECHOCARDIOGRAM (TEE);  Surgeon: Sherren Mocha, MD;  Location: [place];  Service: Open Heart Surgery;  Laterality: N/A;  . TRANSCATHETER AORTIC VALVE REPLACEMENT, TRANSFEMORAL N/A 06/01/2016   Procedure: TRANSCATHETER AORTIC VALVE REPLACEMENT, TRANSFEMORAL;  Surgeon: Sherren Mocha, MD;  Location: [place];  Service: Open Heart Surgery;  Laterality: N/A;    Current Meds  Medication Sig  . acetaminophen (TYLENOL) 325 MG tablet Take 650 mg by mouth every 6 (six) hours as needed for mild pain or headache.   Marland Kitchen aspirin EC 81 MG tablet Take 1 tablet (81 mg total) by mouth daily.  Marland Kitchen atorvastatin (LIPITOR) 80 MG tablet Take 1 tablet (80 mg total) by mouth daily at 6 PM.  . carvedilol (COREG) 6.25 MG tablet Take 1 tablet (6.25 mg total) by mouth 2 (two) times daily.  . isosorbide mononitrate (IMDUR) 30 MG 24 hr tablet Take 1 tablet (30 mg total) by mouth daily.  Marland Kitchen lisinopril (  ZESTRIL) 2.5 MG tablet Take 1 tablet by mouth daily.  . Multiple Vitamins-Minerals (PRESERVISION AREDS PO) Take 1 tablet by mouth daily.   . nitroGLYCERIN (NITROSTAT) 0.4 MG SL tablet Place 0.4 mg under the tongue every 5 (five) minutes as needed for chest pain.  . Omega-3 Fatty Acids (FISH OIL PO) Take 1 capsule by mouth daily.   . pantoprazole (PROTONIX) 40 MG tablet TAKE 1 TABLET (40 MG TOTAL) BY MOUTH DAILY.  . sacubitril-valsartan (ENTRESTO) 49-51 MG Take 1 tablet by mouth 2 (two) times daily.  . tamsulosin (FLOMAX) 0.4 MG CAPS capsule Take 1 capsule (0.4 mg total) by mouth daily.  Marland Kitchen tetrahydrozoline (VISINE) 0.05 % ophthalmic solution Place 2 drops into both eyes 4 (four) times daily as needed (for dry eyes).  . TURMERIC PO Take 1 capsule by mouth at bedtime.     Allergies  Allergen Reactions  . Ranexa [Ranolazine Er] Rash      Review of Systems negative except from HPI and PMH  Physical Exam BP 136/82   Pulse 75   Ht 5\' 9"  (1.753 m)   Wt  193 lb 12.8 oz (87.9 kg)   SpO2 97%   BMI 28.62 kg/m  Well developed and well nourished in no acute distress HENT normal E scleral and icterus clear Neck Supple JVP flat; carotids brisk and full Clear to ausculation Device pocket well healed; without hematoma or erythema.  There is no tethering Regular rate and rhythm, no murmurs gallops or rub Soft with active bowel sounds No clubbing cyanosis Trace Edema Alert and oriented, grossly normal motor and sensory function Skin Warm and Dry  Multiple electrocardiograms were recorded.  He presented with a QRS duration that was negative in lead V1 and upright in lead I.  With reprogramming of his AV delay to 140, he ended up with a Qr  in lead I and then RS in lead V1  Assessment and  Plan  Ischemic heart disease with prior bypass  TAVI  Congestive heart failure-chronic-systolic-class III  Left bundle branch block complicating the above  Cardiomyopathy complicating the above  PVCs question burden  Anemia  CRT-P  Euvolemic.  Multiple efforts were made to reprogram his device to maximize the QRS morphology suggestive of resynchronization.  I spoke with his daughter to explain what we are doing.  Has a significant PVC burden which may be further complicating his cardiomyopathy.  We spent more than 50% of our >25 min visit in face to face counseling regarding the above    Current medicines are reviewed at length with the patient today .  The patient does not  have concerns regarding medicines.

## 2019-11-01 LAB — CUP PACEART REMOTE DEVICE CHECK
Battery Remaining Longevity: 47 mo
Battery Remaining Percentage: 95.5 %
Battery Voltage: 2.98 V
Brady Statistic AP VP Percent: 88 %
Brady Statistic AP VS Percent: 2.7 %
Brady Statistic AS VP Percent: 4.9 %
Brady Statistic AS VS Percent: 1 %
Brady Statistic RA Percent Paced: 87 %
Date Time Interrogation Session: 20201117040015
Implantable Lead Implant Date: 20200810
Implantable Lead Implant Date: 20200810
Implantable Lead Implant Date: 20200810
Implantable Lead Location: 753858
Implantable Lead Location: 753859
Implantable Lead Location: 753860
Implantable Lead Model: 5076
Implantable Lead Model: 5076
Implantable Pulse Generator Implant Date: 20200810
Lead Channel Impedance Value: 410 Ohm
Lead Channel Impedance Value: 440 Ohm
Lead Channel Impedance Value: 530 Ohm
Lead Channel Pacing Threshold Amplitude: 1 V
Lead Channel Pacing Threshold Amplitude: 1.25 V
Lead Channel Pacing Threshold Amplitude: 1.5 V
Lead Channel Pacing Threshold Pulse Width: 0.4 ms
Lead Channel Pacing Threshold Pulse Width: 0.4 ms
Lead Channel Pacing Threshold Pulse Width: 1 ms
Lead Channel Sensing Intrinsic Amplitude: 1.3 mV
Lead Channel Sensing Intrinsic Amplitude: 12 mV
Lead Channel Setting Pacing Amplitude: 2 V
Lead Channel Setting Pacing Amplitude: 3.5 V
Lead Channel Setting Pacing Amplitude: 3.5 V
Lead Channel Setting Pacing Pulse Width: 0.4 ms
Lead Channel Setting Pacing Pulse Width: 1 ms
Lead Channel Setting Sensing Sensitivity: 2 mV
Pulse Gen Serial Number: 9130392

## 2019-11-16 ENCOUNTER — Other Ambulatory Visit: Payer: Self-pay

## 2019-11-16 ENCOUNTER — Ambulatory Visit (INDEPENDENT_AMBULATORY_CARE_PROVIDER_SITE_OTHER): Payer: Medicare HMO | Admitting: Cardiovascular Disease

## 2019-11-16 ENCOUNTER — Encounter: Payer: Self-pay | Admitting: Cardiovascular Disease

## 2019-11-16 VITALS — BP 160/66 | Ht 69.0 in | Wt 195.8 lb

## 2019-11-16 DIAGNOSIS — I2510 Atherosclerotic heart disease of native coronary artery without angina pectoris: Secondary | ICD-10-CM

## 2019-11-16 DIAGNOSIS — I5022 Chronic systolic (congestive) heart failure: Secondary | ICD-10-CM

## 2019-11-16 DIAGNOSIS — Z952 Presence of prosthetic heart valve: Secondary | ICD-10-CM

## 2019-11-16 DIAGNOSIS — I739 Peripheral vascular disease, unspecified: Secondary | ICD-10-CM

## 2019-11-16 DIAGNOSIS — I10 Essential (primary) hypertension: Secondary | ICD-10-CM

## 2019-11-16 MED ORDER — SACUBITRIL-VALSARTAN 97-103 MG PO TABS: 1.0000 | ORAL_TABLET | Freq: Two times a day (BID) | ORAL | 5 refills | Status: DC

## 2019-11-16 NOTE — Patient Instructions (Signed)
Medication Instructions:  Your physician has recommended you make the following change in your medication:   INCREASE Entresto to 97-103mg  bid. An Rx has been sent to your pharmacy. *If you need a refill on your cardiac medications before your next appointment, please call your pharmacy*  Lab Work: None ordered If you have labs (blood work) drawn today and your tests are completely normal, you will receive your results only by: Marland Kitchen MyChart Message (if you have MyChart) OR . A paper copy in the mail If you have any lab test that is abnormal or we need to change your treatment, we will call you to review the results.  Testing/Procedures: None ordered  Follow-Up: At [facility], you and your health needs are our priority.  As part of our continuing mission to provide you with exceptional heart care, we have created designated Provider Care Teams.  These Care Teams include your primary Cardiologist (physician) and Advanced Practice Providers (APPs -  Physician Assistants and Nurse Practitioners) who all work together to provide you with the care you need, when you need it.  Your next appointment:   4 month(s)  The format for your next appointment:   In Person  Provider:    You may see [name], MD or one of the following Advanced Practice Providers on your designated Care Team:    Murray Hodgkins, NP  Christell Faith, PA-C  Marrianne Mood, PA-C   Other Instructions N/A

## 2019-11-16 NOTE — Progress Notes (Signed)
Cardiology Office Note   Date:  11/16/2019   ID:  Matthew [name], DOB 1936/02/03, MRN [id number]  PCP:  Antionette Char, MD  Cardiologist:   [name], MD   Chief Complaint  Patient presents with  . Other    2 month follow up. Patient states he feels better since his pacemaker was preprogrammed. Meds reviewed verbally with patient.       History of Present Illness: Matthew [name] is a 83 y.o. male who presents for a follow-up visit regarding coronary artery disease , aortic stenosis, chronic systolic heart failure and peripheral arterial disease. He is status post CABG twice in 1991 and 2004. He is status post bilateral common iliac artery kissing stent placement in 2015. He is status post TAVR in June 2017. Cardiac catheterization before that showed patent grafts. He developed left bundle branch block post TAVR and this correlated with a gradual decline in his ejection fraction.  Before TAVR, his EF was 55 to 60%.  Shortly after TAVR, his EF was 55%.  In 2018, his EF was noted to be 45 to 50% and then dropped to 30 to 35% in 2019.  He had worsening angina in 2019.  I proceeded with left heart catheterization in June which showed significant underlying three-vessel coronary artery disease with patent LIMA to LAD, patent RIMA to diagonal, patent SVG to OM 3 and patent SVG to right PDA.  The SVG to OM 2 was found to be occluded.  However, OM 2 had retrograde flow from SVG to 3.  There was possible left subclavian artery stenosis but it was not significant by gradient.  This was also interrogated by carotid Doppler and there was nothing to suggest significant left subclavian stenosis. He was noted to have frequent PVCs during cardiac catheterization but that has gradually improved. Holter monitor in June showed 5600 PVCs in 24 hours. We tried Ranexa for angina but he developed a rash with it.  He was hospitalized in October, 2019 with GI bleed and blood loss anemia.  He was found to  have gastric AVMs which were treated.  His anemia improved with IV iron.  Most recent echocardiogram in March of this year showed an EF of 25 to 30%, mild aortic stenosis with mean gradient of 17 mmHg and mild to moderate aortic regurgitation. Given low EF and left bundle branch block, the patient underwent biventricular pacemaker placement in August.  His device was reprogrammed last month by Dr. Caryl Comes due to fatigue.  Since then, the patient actually has felt significantly better and he is doing very well at the present time with no chest pain or shortness of breath.  He reports that his blood pressure has been running high at home with systolic blood pressure between 140 to 160 mmHg.   Past Medical History:  Diagnosis Date  . Alcohol abuse    stopped drinking 42 years ago from today 2020  . Anemia   . AS (aortic stenosis)    a. Echo 6/10: EF 55% mild AS; b. echo 06/2015; EF 55-60%, GR1DD, moderate AS, Peak velocity (S): 346 cm/s. Mean gradientS): 28 mm Hg. Peak gradient (S): 48 mm Hg. Valve area (VTI): 1.18 cm2;   c. Echo 4/17 - mild LVH, EF 55-60%, no RWMA, Gr 1 DD, mod to severe AS (mean 28 mmHg, peak 46 mmHg), mild LAE  . CAD (coronary artery disease)    a. MI 1996 w/ CABG 1996; b.redo CABG 06/2003; c. Myoview 7/09:  EF 54% inferobasal infarct, no ischemia. Myoview 6/10 EF 43% inf wall infarct. no ischemia; c. cath 8/16 s/p DES to VG-OM2. OTW 3VD w/ patent VG->RPDA, VG->OM3,  & LIMA->LAD.  EF 55-65%.  . Carotid bruit    2009 0-39% on dopplers bilatrally  . Chest pain   . GERD (gastroesophageal reflux disease)   . History of kidney stones   . HOH (hard of hearing)   . HTN (hypertension)   . Hyperlipidemia   . Myocardial infarction ([place]) 1996  . Nephrolithiasis   . OSA (obstructive sleep apnea)   . PAD (peripheral artery disease) ([place]) 02/2014   Subtotal occlusion of right common iliac artery and 70% stenosis in the left common iliac artery. Status post bilateral kissing stent placement.  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 Shortness of breath    with exertion  . Thrombocytopenia ([place])     Past Surgical History:  Procedure Laterality Date  . ABDOMINAL AORTAGRAM N/A 03/06/2014   Procedure: ABDOMINAL Maxcine Ham;  Surgeon: Wellington Hampshire, MD;  Location: [facility] CATH [facility];  Service: Cardiovascular;  Laterality: N/A;  . BIV PACEMAKER INSERTION CRT-P N/A 07/23/2019   Procedure: BIV PACEMAKER INSERTION CRT-P;  Surgeon: Deboraha Sprang, MD;  Location: [place] [facility];  Service: Cardiovascular;  Laterality: N/A;  . CARDIAC CATHETERIZATION  02/2014   Severe three-vessel coronary artery disease with patent grafts.   Marland Kitchen CARDIAC CATHETERIZATION N/A 07/31/2015   Procedure: Right/Left Heart Cath and Coronary/Graft Angiography;  Surgeon: Wellington Hampshire, MD;  Location: [place] [facility];  Service: Cardiovascular;  Laterality: N/A;  . CARDIAC CATHETERIZATION N/A 07/31/2015   Procedure: Coronary Stent Intervention;  Surgeon: Wellington Hampshire, MD;  Location: [place] [facility];  Service: Cardiovascular;  Laterality: N/A;  . CARDIAC CATHETERIZATION N/A 05/26/2016   Procedure: Right/Left Heart Cath and Coronary Angiography;  Surgeon: Wellington Hampshire, MD;  Location: [place] CV [facility];  Service: Cardiovascular;  Laterality: N/A;  . COLONOSCOPY WITH PROPOFOL N/A 10/07/2018   Procedure: COLONOSCOPY WITH PROPOFOL;  Surgeon: Lin Landsman, MD;  Location[facility]OSCOPY;  Service: Gastroenterology;  Laterality: N/A;  . COLONOSCOPY WITH PROPOFOL N/A 01/08/2019   Procedure: COLONOSCOPY WITH PROPOFOL;  Surgeon: Lin Landsman, MD;  Location: [facility] ENDOSCOPY;  Service: Gastroenterology;  Laterality: N/A;  . CORONARY ARTERY BYPASS GRAFT  1991   at [facility]. Redone 2004-3 vessels 1st time and 4 second time  . CYSTO     2/3 times for  kidney stones  . ENTEROSCOPY N/A 12/25/2018   Procedure: ENTEROSCOPY Single balloon;  Surgeon: Lin Landsman, MD;  Location: [place];  Service: Gastroenterology;  Laterality: N/A;  . ESOPHAGOGASTRODUODENOSCOPY (EGD) WITH PROPOFOL N/A 10/07/2018   Procedure: ESOPHAGOGASTRODUODENOSCOPY (EGD) WITH PROPOFOL;  Surgeon: Lin Landsman, MD;  Location: [facility] ENDOSCOPY;  Service: Gastroenterology;  Laterality: N/A;  . GIVENS CAPSULE STUDY N/A 12/22/2018   Procedure: GIVENS CAPSULE STUDY;  Surgeon: Lin Landsman, MD;  Location: [facility] ENDOSCOPY;  Service: Gastroenterology;  Laterality: N/A;  . INGUINAL HERNIA REPAIR  11/10/2011   Procedure: HERNIA REPAIR INGUINAL ADULT;  Surgeon: Donato Heinz;  Location: [facility];  Service: General;  Laterality: Right;  . LAPAROSCOPIC RIGHT COLECTOMY Right 02/09/2019   Procedure: LAPAROSCOPIC RIGHT COLECTOMY;  Surgeon: Jules Husbands, MD;  Location: [facility] ORS;  Service: General;  Laterality: Right;  . LEFT  HEART CATH AND CORONARY ANGIOGRAPHY Left 05/15/2018   Procedure: LEFT HEART CATH AND CORONARY ANGIOGRAPHY;  Surgeon: Wellington Hampshire, MD;  Location: [place] CV LAB;  Service: Cardiovascular;  Laterality: Left;  . LEFT HEART CATHETERIZATION WITH CORONARY/GRAFT ANGIOGRAM N/A 03/06/2014   Procedure: LEFT HEART CATHETERIZATION WITH Beatrix Fetters;  Surgeon: Wellington Hampshire, MD;  Location: [place] CATH LAB;  Service: Cardiovascular;  Laterality: N/A;  . STOMACH SURGERY     removal of gastric ulcers  . TEE WITHOUT CARDIOVERSION N/A 06/01/2016   Procedure: TRANSESOPHAGEAL ECHOCARDIOGRAM (TEE);  Surgeon: Sherren Mocha, MD;  Location: [place];  Service: Open Heart Surgery;  Laterality: N/A;  . TRANSCATHETER AORTIC VALVE REPLACEMENT, TRANSFEMORAL N/A 06/01/2016   Procedure: TRANSCATHETER AORTIC VALVE REPLACEMENT, TRANSFEMORAL;  Surgeon: Sherren Mocha, MD;  Location: [place];  Service: Open Heart Surgery;  Laterality: N/A;     Current Outpatient  Medications  Medication Sig Dispense Refill  . acetaminophen (TYLENOL) 325 MG tablet Take 650 mg by mouth every 6 (six) hours as needed for mild pain or headache.     Marland Kitchen aspirin EC 81 MG tablet Take 1 tablet (81 mg total) by mouth daily. 90 tablet 3  . atorvastatin (LIPITOR) 80 MG tablet Take 1 tablet (80 mg total) by mouth daily at 6 PM. 90 tablet 2  . carvedilol (COREG) 6.25 MG tablet Take 1 tablet (6.25 mg total) by mouth 2 (two) times daily. 60 tablet 11  . isosorbide mononitrate (IMDUR) 30 MG 24 hr tablet Take 1 tablet (30 mg total) by mouth daily. 30 tablet 3  . lisinopril (ZESTRIL) 2.5 MG tablet Take 1 tablet by mouth daily.    . Multiple Vitamins-Minerals (PRESERVISION AREDS PO) Take 1 tablet by mouth daily.     . nitroGLYCERIN (NITROSTAT) 0.4 MG SL tablet Place 0.4 mg under the tongue every 5 (five) minutes as needed for chest pain.    . Omega-3 Fatty Acids (FISH OIL PO) Take 1 capsule by mouth daily.     . pantoprazole (PROTONIX) 40 MG tablet TAKE 1 TABLET (40 MG TOTAL) BY MOUTH DAILY. 90 tablet 3  . sacubitril-valsartan (ENTRESTO) 49-51 MG Take 1 tablet by mouth 2 (two) times daily. 180 tablet 2  . tamsulosin (FLOMAX) 0.4 MG CAPS capsule Take 1 capsule (0.4 mg total) by mouth daily. 90 capsule 1  . tetrahydrozoline (VISINE) 0.05 % ophthalmic solution Place 2 drops into both eyes 4 (four) times daily as needed (for dry eyes).    . TURMERIC PO Take 1 capsule by mouth at bedtime.      No current facility-administered medications for this visit.     Allergies:   Ranexa [ranolazine er]    Social History:  The patient  reports that he quit smoking about 29 years ago. His smoking use included cigarettes. He has a 50.00 pack-year smoking history. He has never used smokeless tobacco. He reports that he does not drink alcohol or use drugs.   Family History:  The patient's family history includes Heart disease in his father and mother.    ROS:  Please see the history of present illness.    Otherwise, review of systems are positive for none.   All other systems are reviewed and negative.    PHYSICAL EXAM: VS:  BP (!) 160/66 (BP Location: Left Arm, Patient Position: Sitting, Cuff Size: Normal)   Ht 5\' 9"  (1.753 m)   Wt 195 lb 12 oz (88.8 kg)   BMI 28.91 kg/m  , BMI Body mass  index is 28.91 kg/m. GEN: Well nourished, well developed, in no acute distress  HEENT: normal  Neck: no JVD, carotid bruits, or masses Cardiac: RRR; no rubs, or gallops,no edema .   2/6 crescendo decrescendo aortic murmur which is early peaking. Respiratory:  clear to auscultation bilaterally, normal work of breathing GI: soft, nontender, nondistended, + BS MS: no deformity or atrophy  Skin: warm and dry, no rash Neuro:  Strength and sensation are intact Psych: euthymic mood, full affect Vascular: Femoral pulses are normal bilaterally. Distal pulses are palpable.  EKG:  EKG is ordered today. The ekg ordered today demonstrates AV sequential pacemaker.   Recent Labs: 01/17/2019: ALT 17 02/10/2019: Magnesium 2.0 07/12/2019: BUN 14; Creatinine, Ser 1.09; Potassium 3.9; Sodium 141 09/04/2019: Hemoglobin 13.0; Platelets 109    Lipid Panel    Component Value Date/Time   CHOL 122 10/02/2018 0847   CHOL 160 04/11/2018 1451   TRIG 147 10/02/2018 0847   HDL 30 (L) 10/02/2018 0847   HDL 44 04/11/2018 1451   CHOLHDL 4.1 10/02/2018 0847   VLDL 29 10/02/2018 0847   LDLCALC 63 10/02/2018 0847   LDLCALC 63 04/11/2018 1451      Wt Readings from Last 3 Encounters:  11/16/19 195 lb 12 oz (88.8 kg)  10/30/19 193 lb 12.8 oz (87.9 kg)  07/24/19 189 lb 11.2 oz (86 kg)        ASSESSMENT AND PLAN:  1.  S/p TAVR  for severe symptomatic aortic stenosis: Most recent echocardiogram in March showed mildly elevated gradient at 17 mmHg with mild to moderate aortic regurgitation.  He is currently [place] Heart Association class II  2. Coronary artery disease involving graft bypass with stable angina: He  reports almost complete resolution of angina since he was started on small dose of Imdur.    3.  Chronic systolic heart failure: Most recent ejection fraction was 25 to 30% but since then he underwent CRT with improvement in symptoms.  His blood pressure is elevated today and I elected to increase Entresto.  Continue carvedilol.  The patient is no longer on lisinopril and this was removed from his medication list.    4. Essential hypertension: Blood pressure has been elevated.  I increased Entresto as outlined above.  5. Peripheral arterial disease: No recurrent claudication since bilateral iliac stenting.   6. Hyperlipidemia: Continue high-dose atorvastatin with a target LDL of less than 70.  Most recent LDL was 63.   Disposition:   FU with me in 4 months  Signed,  [name], MD  11/16/2019 4:14 PM    [place]

## 2019-11-22 ENCOUNTER — Encounter: Payer: Self-pay | Admitting: Oncology

## 2019-11-23 ENCOUNTER — Other Ambulatory Visit: Payer: Self-pay

## 2019-11-23 ENCOUNTER — Inpatient Hospital Stay: Payer: Medicare HMO | Attending: Oncology

## 2019-11-23 DIAGNOSIS — D509 Iron deficiency anemia, unspecified: Secondary | ICD-10-CM | POA: Insufficient documentation

## 2019-11-23 DIAGNOSIS — D500 Iron deficiency anemia secondary to blood loss (chronic): Secondary | ICD-10-CM

## 2019-11-23 LAB — CBC
HCT: 44.6 % (ref 39.0–52.0)
Hemoglobin: 14.9 g/dL (ref 13.0–17.0)
MCH: 31.6 pg (ref 26.0–34.0)
MCHC: 33.4 g/dL (ref 30.0–36.0)
MCV: 94.7 fL (ref 80.0–100.0)
Platelets: 101 10*3/uL — ABNORMAL LOW (ref 150–400)
RBC: 4.71 MIL/uL (ref 4.22–5.81)
RDW: 13.1 % (ref 11.5–15.5)
WBC: 5.7 10*3/uL (ref 4.0–10.5)
nRBC: 0 % (ref 0.0–0.2)

## 2019-11-23 LAB — FERRITIN: Ferritin: 155 ng/mL (ref 24–336)

## 2019-11-23 LAB — IRON AND TIBC
Iron: 81 ug/dL (ref 45–182)
Saturation Ratios: 29 % (ref 17.9–39.5)
TIBC: 283 ug/dL (ref 250–450)
UIBC: 202 ug/dL

## 2019-11-26 ENCOUNTER — Inpatient Hospital Stay (HOSPITAL_BASED_OUTPATIENT_CLINIC_OR_DEPARTMENT_OTHER): Payer: Medicare HMO | Admitting: Oncology

## 2019-11-26 ENCOUNTER — Other Ambulatory Visit: Payer: Self-pay

## 2019-11-26 ENCOUNTER — Encounter: Payer: Self-pay | Admitting: Oncology

## 2019-11-26 DIAGNOSIS — D509 Iron deficiency anemia, unspecified: Secondary | ICD-10-CM | POA: Diagnosis not present

## 2019-11-26 DIAGNOSIS — D696 Thrombocytopenia, unspecified: Secondary | ICD-10-CM | POA: Diagnosis not present

## 2019-11-26 NOTE — Addendum Note (Signed)
Addended by: Douglass Rivers D on: 11/26/2019 01:11 PM   Modules accepted: Level of Service

## 2019-11-26 NOTE — Progress Notes (Signed)
Remote pacemaker transmission.   

## 2019-11-27 NOTE — Progress Notes (Signed)
I connected with Matthew [name] on 11/27/19 at  3:15 PM EST by video enabled telemedicine visit and verified that I am speaking with the correct person using two identifiers.   I discussed the limitations, risks, security and privacy concerns of performing an evaluation and management service by telemedicine and the availability of in-person appointments. I also discussed with the patient that there may be a patient responsible charge related to this service. The patient expressed understanding and agreed to proceed.  Other persons participating in the visit and their role in the encounter:  Patients daughter  There were problems with  video visit and was switched to telephone call during the process  Patient's location:  home Provider's location:  work  Risk analyst Complaint: Routine follow-up of iron deficiency anemia  History of present illness: patient is a 83 year old male with past medical history significant for severe aortic stenosis, mild thrombocytopenia among other medical problems. He was recently admitted to the hospital for acute symptomatic anemia from upper GI bleed when his hemoglobin dropped down from 10.6-7. Labs indicated severe iron deficiency. He underwent EGD and colonoscopy. Upper endoscopy showed several gastric AVMs which were treated with APC. 2 cm hiatal hernia was also noted. Colonoscopy showed a large flat polyp in the cecum that was not resected due to poor prep. Patient has seen Dr. Marius Ditch who plans to do a repeat colonoscopy in January 2020. Patient was also found to have low B12 levels of 128.  Patient received B12 shots as well as IV iron with normalization of his hemoglobin  Interval history patient is currently taking oral iron which she is tolerating well without any significant side effects.  Energy levels are good and appetite and weight have remained stable   Review of Systems  Constitutional: Negative for chills, fever, malaise/fatigue and weight loss.   HENT: Negative for congestion, ear discharge and nosebleeds.   Eyes: Negative for blurred vision.  Respiratory: Negative for cough, hemoptysis, sputum production, shortness of breath and wheezing.   Cardiovascular: Negative for chest pain, palpitations, orthopnea and claudication.  Gastrointestinal: Negative for abdominal pain, blood in stool, constipation, diarrhea, heartburn, melena, nausea and vomiting.  Genitourinary: Negative for dysuria, flank pain, frequency, hematuria and urgency.  Musculoskeletal: Negative for back pain, joint pain and myalgias.  Skin: Negative for rash.  Neurological: Negative for dizziness, tingling, focal weakness, seizures, weakness and headaches.  Endo/Heme/Allergies: Does not bruise/bleed easily.  Psychiatric/Behavioral: Negative for depression and suicidal ideas. The patient does not have insomnia.     Allergies  Allergen Reactions  . Ranexa [Ranolazine Er] Rash    Past Medical History:  Diagnosis Date  . Alcohol abuse    stopped drinking 42 years ago from today 2020  . Anemia   . AS (aortic stenosis)    a. Echo 6/10: EF 55% mild AS; b. echo 06/2015; EF 55-60%, GR1DD, moderate AS, Peak velocity (S): 346 cm/s. Mean gradientS): 28 mm Hg. Peak gradient (S): 48 mm Hg. Valve area (VTI): 1.18 cm2;   c. Echo 4/17 - mild LVH, EF 55-60%, no RWMA, Gr 1 DD, mod to severe AS (mean 28 mmHg, peak 46 mmHg), mild LAE  . CAD (coronary artery disease)    a. MI 1996 w/ CABG 1996; b.redo CABG 06/2003; c. Myoview 7/09: EF 54% inferobasal infarct, no ischemia. Myoview 6/10 EF 43% inf wall infarct. no ischemia; c. cath 8/16 s/p DES to VG-OM2. OTW 3VD w/ patent VG->RPDA, VG->OM3,  & LIMA->LAD.  EF 55-65%.  . Carotid bruit  2009 0-39% on dopplers bilatrally  . Chest pain   . GERD (gastroesophageal reflux disease)   . History of kidney stones   . HOH (hard of hearing)   . HTN (hypertension)   . Hyperlipidemia   . Myocardial infarction ([place]) 1996  . Nephrolithiasis   .  OSA (obstructive sleep apnea)   . PAD (peripheral artery disease) ([place]) 02/2014   Subtotal occlusion of right common iliac artery and 70% stenosis in the left common iliac artery. Status post bilateral kissing stent placement. 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 Shortness of breath    with exertion  . Thrombocytopenia ([place])     Past Surgical History:  Procedure Laterality Date  . ABDOMINAL AORTAGRAM N/A 03/06/2014   Procedure: ABDOMINAL Maxcine Ham;  Surgeon: Wellington Hampshire, MD;  Location: [facility] CATH LAB;  Service: Cardiovascular;  Laterality: N/A;  . BIV PACEMAKER INSERTION CRT-P N/A 07/23/2019   Procedure: BIV PACEMAKER INSERTION CRT-P;  Surgeon: Deboraha Sprang, MD;  Location: [facility];  Service: Cardiovascular;  Laterality: N/A;  . CARDIAC CATHETERIZATION  02/2014   Severe three-vessel coronary artery disease with patent grafts.   Marland Kitchen CARDIAC CATHETERIZATION N/A 07/31/2015   Procedure: Right/Left Heart Cath and Coronary/Graft Angiography;  Surgeon: Wellington Hampshire, MD;  Location: [place] CV LAB;  Service: Cardiovascular;  Laterality: N/A;  . CARDIAC CATHETERIZATION N/A 07/31/2015   Procedure: Coronary Stent Intervention;  Surgeon: Wellington Hampshire, MD;  Location: [place] [facility] LAB;  Service: Cardiovascular;  Laterality: N/A;  . CARDIAC CATHETERIZATION N/A 05/26/2016   Procedure: Right/Left Heart Cath and Coronary Angiography;  Surgeon: Wellington Hampshire, MD;  Location: [place] CV LAB;  Service: Cardiovascular;  Laterality: N/A;  . COLONOSCOPY WITH PROPOFOL N/A 10/07/2018   Procedure: COLONOSCOPY WITH PROPOFOL;  Surgeon: Lin Landsman, MD;  Location: [facility]COPY;  Service: Gastroenterology;  Laterality: N/A;  . COLONOSCOPY WITH PROPOFOL N/A 01/08/2019   Procedure: COLONOSCOPY WITH PROPOFOL;   Surgeon: Lin Landsman, MD;  Location: [facility] ENDOSCOPY;  Service: Gastroenterology;  Laterality: N/A;  . CORONARY ARTERY BYPASS GRAFT  1991   at [facility]. Redone 2004-3 vessels 1st time and 4 second time  . CYSTO     2/3 times for kidney stones  . ENTEROSCOPY N/A 12/25/2018   Procedure: ENTEROSCOPY Single balloon;  Surgeon: Lin Landsman, MD;  Location: [place];  Service: Gastroenterology;  Laterality: N/A;  . ESOPHAGOGASTRODUODENOSCOPY (EGD) WITH PROPOFOL N/A 10/07/2018   Procedure: ESOPHAGOGASTRODUODENOSCOPY (EGD) WITH PROPOFOL;  Surgeon: Lin Landsman, MD;  Location: [facility] ENDOSCOPY;  Service: Gastroenterology;  Laterality: N/A;  . GIVENS CAPSULE STUDY N/A 12/22/2018   Procedure: GIVENS CAPSULE STUDY;  Surgeon: Lin Landsman, MD;  Location: [facility] ENDOSCOPY;  Service: Gastroenterology;  Laterality: N/A;  . INGUINAL HERNIA REPAIR  11/10/2011   Procedure: HERNIA REPAIR INGUINAL ADULT;  Surgeon: Donato Heinz;  Location: [facility];  Service: General;  Laterality: Right;  . LAPAROSCOPIC RIGHT COLECTOMY Right 02/09/2019   Procedure: LAPAROSCOPIC RIGHT COLECTOMY;  Surgeon: Jules Husbands, MD;  Location: [facility] ORS;  Service: General;  Laterality: Right;  . LEFT HEART CATH AND CORONARY ANGIOGRAPHY Left 05/15/2018   Procedure: LEFT HEART CATH AND CORONARY ANGIOGRAPHY;  Surgeon: Wellington Hampshire, MD;  Location: [place] CV LAB;  Service: Cardiovascular;  Laterality: Left;  . LEFT HEART CATHETERIZATION WITH CORONARY/GRAFT  ANGIOGRAM N/A 03/06/2014   Procedure: LEFT HEART CATHETERIZATION WITH Beatrix Fetters;  Surgeon: Wellington Hampshire, MD;  [facility];  Service: Cardiovascular;  Laterality: N/A;  . STOMACH SURGERY     removal of gastric ulcers  . TEE WITHOUT CARDIOVERSION N/A 06/01/2016   Procedure: TRANSESOPHAGEAL ECHOCARDIOGRAM (TEE);  Surgeon: Sherren Mocha, MD;  Location: [place];  Service: Open Heart Surgery;  Laterality: N/A;  . TRANSCATHETER AORTIC VALVE  REPLACEMENT, TRANSFEMORAL N/A 06/01/2016   Procedure: TRANSCATHETER AORTIC VALVE REPLACEMENT, TRANSFEMORAL;  Surgeon: Sherren Mocha, MD;  Location: [place];  Service: Open Heart Surgery;  Laterality: N/A;    Social History   Socioeconomic History  . Marital status: Married    Spouse name: Not on file  . Number of children: 2  . Years of education: Not on file  . Highest education level: Not on file  Occupational History  . Occupation: Retired  Tobacco Use  . Smoking status: Former Smoker    Packs/day: 1.00    Years: 50.00    Pack years: 50.00    Types: Cigarettes    Quit date: 01/24/1990    Years since quitting: 29.8  . Smokeless tobacco: Never Used  Substance and Sexual Activity  . Alcohol use: No    Alcohol/week: 0.0 standard drinks    Comment: Alcoholic in past  . Drug use: No  . Sexual activity: Never  Other Topics Concern  . Not on file  Social History Narrative   Married an lives with wife in [place]. Retired form [employer].    Social Determinants of Health   Financial Resource Strain:   . Difficulty of Paying Living Expenses: Not on file  Food Insecurity:   . Worried About Charity fundraiser in the Last Year: Not on file  . Ran Out of Food in the Last Year: Not on file  Transportation Needs:   . Lack of Transportation (Medical): Not on file  . Lack of Transportation (Non-Medical): Not on file  Physical Activity:   . Days of Exercise per Week: Not on file  . Minutes of Exercise per Session: Not on file  Stress:   . Feeling of Stress : Not on file  Social Connections:   . Frequency of Communication with Friends and Family: Not on file  . Frequency of Social Gatherings with Friends and Family: Not on file  . Attends Religious Services: Not on file  . Active Member of Clubs or Organizations: Not on file  . Attends Archivist Meetings: Not on file  . Marital Status: Not on file  Intimate Partner Violence:   . Fear of Current or  Ex-Partner: Not on file  . Emotionally Abused: Not on file  . Physically Abused: Not on file  . Sexually Abused: Not on file    Family History  Problem Relation Age of Onset  . Heart disease Father   . Heart disease Mother   . Colon cancer Neg Hx      Current Outpatient Medications:  .  acetaminophen (TYLENOL) 325 MG tablet, Take 650 mg by mouth every 6 (six) hours as needed for mild pain or headache. , Disp: , Rfl:  .  aspirin EC 81 MG tablet, Take 1 tablet (81 mg total) by mouth daily., Disp: 90 tablet, Rfl: 3 .  atorvastatin (LIPITOR) 80 MG tablet, Take 1 tablet (80 mg total) by mouth daily at 6 PM., Disp: 90 tablet, Rfl: 2 .  carvedilol (COREG) 6.25 MG tablet, Take  1 tablet (6.25 mg total) by mouth 2 (two) times daily., Disp: 60 tablet, Rfl: 11 .  isosorbide mononitrate (IMDUR) 30 MG 24 hr tablet, Take 1 tablet (30 mg total) by mouth daily., Disp: 30 tablet, Rfl: 3 .  Multiple Vitamins-Minerals (PRESERVISION AREDS PO), Take 1 tablet by mouth daily. , Disp: , Rfl:  .  nitroGLYCERIN (NITROSTAT) 0.4 MG SL tablet, Place 0.4 mg under the tongue every 5 (five) minutes as needed for chest pain., Disp: , Rfl:  .  Omega-3 Fatty Acids (FISH OIL PO), Take 1 capsule by mouth daily. , Disp: , Rfl:  .  pantoprazole (PROTONIX) 40 MG tablet, TAKE 1 TABLET (40 MG TOTAL) BY MOUTH DAILY., Disp: 90 tablet, Rfl: 3 .  sacubitril-valsartan (ENTRESTO) 97-103 MG, Take 1 tablet by mouth 2 (two) times daily., Disp: 60 tablet, Rfl: 5 .  tamsulosin (FLOMAX) 0.4 MG CAPS capsule, Take 1 capsule (0.4 mg total) by mouth daily., Disp: 90 capsule, Rfl: 1 .  tetrahydrozoline (VISINE) 0.05 % ophthalmic solution, Place 2 drops into both eyes 4 (four) times daily as needed (for dry eyes)., Disp: , Rfl:  .  TURMERIC PO, Take 1 capsule by mouth at bedtime. , Disp: , Rfl:   CUP PACEART INCLINIC DEVICE CHECK  Result Date: 10/30/2019 CRT-P device check in clinic. Normal device function. Thresholds, sensing, impedance  consistent with previous measurements. Histograms appropriate for patient and level of activity. 5 mode switches (<1%), AT, longest 8sec. No ventricular high rate episodes. Patient bi-ventricularly pacing 93% of the time due to PVCs. Device programmed with appropriate safety margins. CorVue currently at baseline. Changes today per SK: RA output reduced to 2.0V, RV output reduced to 2.0V, PAV/SAV reprogrammed to 140/154ms, LV+RV timing simultaneous. Estimated longevity 5.7-6.3 years. Patient enrolled in remote follow-up. Merlin on 01/29/20 and ROV with SK in 3 months.Levander Campion BSN, RN, CCDS  CUP PACEART REMOTE DEVICE CHECK  Result Date: 11/01/2019 Scheduled remote reviewed.  Normal device function.  5 PAF events <1% burden longest duration 4-8 secs Next remote 91 days.   No images are attached to the encounter.   CMP Latest Ref Rng & Units 07/12/2019  Glucose 65 - 99 mg/dL 96  BUN 8 - 27 mg/dL 14  Creatinine 0.76 - 1.27 mg/dL 1.09  Sodium 134 - 144 mmol/L 141  Potassium 3.5 - 5.2 mmol/L 3.9  Chloride 96 - 106 mmol/L 104  CO2 20 - 29 mmol/L 23  Calcium 8.6 - 10.2 mg/dL 9.3  Total Protein 6.5 - 8.1 g/dL -  Total Bilirubin 0.3 - 1.2 mg/dL -  Alkaline Phos 38 - 126 U/L -  AST 15 - 41 U/L -  ALT 0 - 44 U/L -   CBC Latest Ref Rng & Units 11/23/2019  WBC 4.0 - 10.5 K/uL 5.7  Hemoglobin 13.0 - 17.0 g/dL 14.9  Hematocrit 39.0 - 52.0 % 44.6  Platelets 150 - 400 K/uL 101(L)    Assessment and plan: Patient is a 83 year old man with history of iron deficiency anemia and B12 deficiency as well as mild thrombocytopenia  1.  Iron deficiency anemia: Patient's hemoglobin continues to be normal.  Iron studies are within normal limits he does not require IV iron at this time.  2.  Thrombocytopenia: Stable with a platelet count in the 100s.  Continue to monitor  Follow-up instructions: Check CBC ferritin and iron studies in 3 months in 6 months and I will see him back in 6 months.  B12 levels to  be checked  in 3 months  I discussed the assessment and treatment plan with the patient. The patient was provided an opportunity to ask questions and all were answered. The patient agreed with the plan and demonstrated an understanding of the instructions.   The patient was advised to call back or seek an in-person evaluation if the symptoms worsen or if the condition fails to improve as anticipated.   Visit Diagnosis: 1. Iron deficiency anemia, unspecified iron deficiency anemia type   2. Thrombocytopenia ([place])     Dr. Randa Evens, MD, MPH [facility] [phone] 11/27/2019 12:28 PM

## 2019-12-01 ENCOUNTER — Other Ambulatory Visit: Payer: Self-pay | Admitting: Internal Medicine

## 2019-12-04 ENCOUNTER — Ambulatory Visit: Payer: Medicare HMO | Admitting: Oncology

## 2019-12-04 ENCOUNTER — Other Ambulatory Visit: Payer: Medicare HMO

## 2019-12-15 IMAGING — DX DG CHEST 1V
1 series · 1 of 1 positions shown · non-contrast
Comparison: 10/01/2018

CLINICAL DATA: Chest pain

EXAM:
CHEST  1 VIEW

[chest ap]
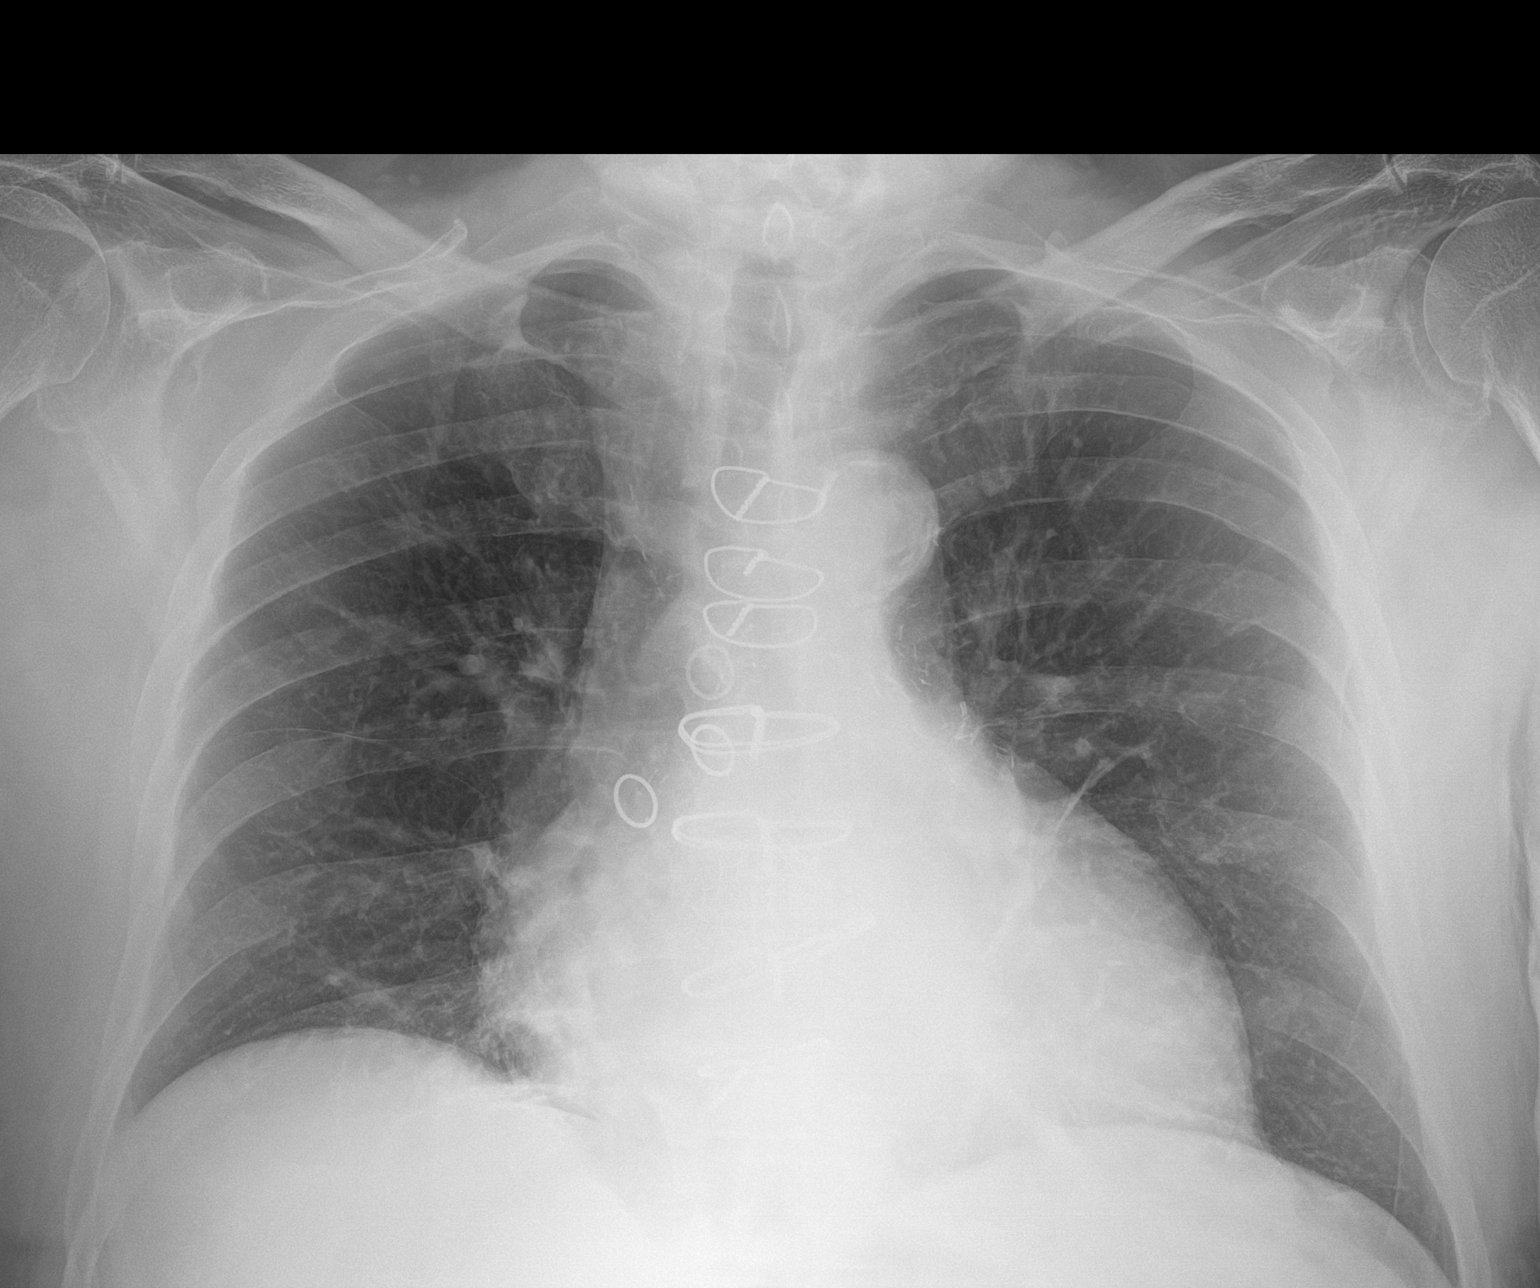

[1 of 1 positions shown; findings below may reference images not displayed]

FINDINGS: The heart is mildly enlarged. Normal vascularity. Postoperative
changes from CABG. Low lung volumes. Bibasilar atelectasis.
IMPRESSION: Cardiomegaly without decompensation.  Bibasilar atelectasis.

## 2019-12-31 ENCOUNTER — Other Ambulatory Visit: Payer: Self-pay | Admitting: *Deleted

## 2019-12-31 ENCOUNTER — Other Ambulatory Visit: Payer: Self-pay | Admitting: Internal Medicine

## 2019-12-31 MED ORDER — SACUBITRIL-VALSARTAN 97-103 MG PO TABS: 1.0000 | ORAL_TABLET | Freq: Two times a day (BID) | ORAL | 1 refills | Status: DC

## 2019-12-31 MED ORDER — ATORVASTATIN CALCIUM 80 MG PO TABS: 80.0000 mg | ORAL_TABLET | Freq: Every day | ORAL | 1 refills | Status: DC

## 2019-12-31 MED ORDER — PANTOPRAZOLE SODIUM 40 MG PO TBEC: 40.0000 mg | DELAYED_RELEASE_TABLET | Freq: Every day | ORAL | 1 refills | Status: DC

## 2019-12-31 MED ORDER — ISOSORBIDE MONONITRATE ER 30 MG PO TB24: 30.0000 mg | ORAL_TABLET | Freq: Every day | ORAL | 0 refills | Status: DC

## 2019-12-31 MED ORDER — CARVEDILOL 6.25 MG PO TABS: 6.2500 mg | ORAL_TABLET | Freq: Two times a day (BID) | ORAL | 1 refills | Status: DC

## 2019-12-31 NOTE — Telephone Encounter (Signed)
Requested Prescriptions   Signed Prescriptions Disp Refills  . isosorbide mononitrate (IMDUR) 30 MG 24 hr tablet 30 tablet 0    Sig: Take 1 tablet (30 mg total) by mouth daily.    Authorizing Provider: Deboraha Sprang    Ordering User: Britt Bottom

## 2020-01-04 ENCOUNTER — Telehealth: Payer: Self-pay

## 2020-01-04 NOTE — Telephone Encounter (Signed)
Rozetta Nunnery, will you please call Arshdeep Mendonsa's daughter lynette and schedule him an appointment? Thank you!

## 2020-01-17 ENCOUNTER — Encounter: Payer: Self-pay | Admitting: Pain Medicine

## 2020-01-17 NOTE — Progress Notes (Signed)
Patient has not been here since 03/29/18.  Having increased back pain that is going into hips.   Will be speaking with patient and daughter Matthew [name] during visit.

## 2020-01-20 NOTE — Progress Notes (Signed)
Patient: Matthew [name]  Service Category: E/M  Provider: Gaspar Cola, MD  DOB: May 19, 1936  DOS: 01/21/2020  Location: Office  MRN: [id number]  Setting: Ambulatory outpatient  Referring Provider: Antionette Char, MD  Type: Established Patient  Specialty: Interventional Pain Management  PCP: Antionette Char, MD  Location: Remote location  Delivery: TeleHealth     Virtual Encounter - Pain Management PROVIDER NOTE: Information contained herein reflects review and annotations entered in association with encounter. Interpretation of such information and data should be left to medically-trained personnel. Information provided to patient can be located elsewhere in the medical record under "Patient Instructions". Document created using STT-dictation technology, any transcriptional errors that may result from process are unintentional.    Contact & Pharmacy Preferred: [phone] Home: [phone] (home) Mobile: [phone] (mobile) E-mail: [email or web address]  [place], [place] [address] Phone: [phone] Fax: [phone]   Pre-screening  Matthew [name] offered "in-person" vs "virtual" encounter. He indicated preferring virtual for this encounter.   Reason COVID-19*  Social distancing based on CDC and AMA recommendations.   I contacted Matthew [name] on 01/21/2020 via telephone.      I clearly identified myself as Gaspar Cola, MD. I verified that I was speaking with the correct person using two identifiers (Name: Matthew [name], and date of birth: December 29, 1935).  Consent I sought verbal advanced consent from Matthew [name] for virtual visit interactions. I informed Matthew [name] of possible security and privacy concerns, risks, and limitations associated with providing "not-in-person" medical evaluation and management services. I also informed Matthew [name] of the availability of "in-person" appointments. Finally, I  informed him that there would be a charge for the virtual visit and that he could be  personally, fully or partially, financially responsible for it. Matthew [name] expressed understanding and agreed to proceed.   Historic Elements   Matthew [name] is a 84 y.o. year old, male patient evaluated today after his last contact with our practice on 01/04/2020. Matthew [name]  has a past medical history of Alcohol abuse, Anemia, AS (aortic stenosis), CAD (coronary artery disease), Carotid bruit, Chest pain, GERD (gastroesophageal reflux disease), History of kidney stones, HOH (hard of hearing), HTN (hypertension), Hyperlipidemia, Myocardial infarction ([employer]) (1996), Nephrolithiasis, OSA (obstructive sleep apnea), PAD (peripheral artery disease) ([place]) (02/2014), Shortness of breath, and Thrombocytopenia ([place]). He also  has a past surgical history that includes Stomach surgery; Coronary artery bypass graft (1991); Inguinal hernia repair (11/10/2011); left heart catheterization with coronary/graft angiogram (N/A, 03/06/2014); abdominal aortagram (N/A, 03/06/2014); Cardiac catheterization (02/2014); Cardiac catheterization (N/A, 07/31/2015); Cardiac catheterization (N/A, 07/31/2015); Cardiac catheterization (N/A, 05/26/2016); Cysto; Transcatheter aortic valve replacement, transfemoral (N/A, 06/01/2016); TEE without cardioversion (N/A, 06/01/2016); LEFT HEART CATH AND CORONARY ANGIOGRAPHY (Left, 05/15/2018); Esophagogastroduodenoscopy (egd) with propofol (N/A, 10/07/2018); Colonoscopy with propofol (N/A, 10/07/2018); Givens capsule study (N/A, 12/22/2018); enteroscopy (N/A, 12/25/2018); Colonoscopy with propofol (N/A, 01/08/2019); Laparoscopic right colectomy (Right, 02/09/2019); and BIV PACEMAKER INSERTION CRT-P (N/A, 07/23/2019). Matthew [name] has a current medication list which includes the following prescription(s): acetaminophen, aspirin ec, atorvastatin, carvedilol, isosorbide mononitrate, multiple vitamins-minerals, nitroglycerin, omega-3 fatty  acids, pantoprazole, sacubitril-valsartan, tamsulosin, tetrahydrozoline, and turmeric. He  reports that he quit smoking about 30 years ago. His smoking use included cigarettes. He has a 50.00 pack-year smoking history. He has never used smokeless tobacco. He reports that he does not drink alcohol or use drugs. Matthew [name] is allergic to ranexa [ranolazine er].  HPI  Today, he is being contacted for worsening of previously known (established) problem.  The patient indicates that he was lifting some wood and started having some pain in the lower back again.  He is 84 years old and he continues to be very active.  He refers that this pain is a little bit different from what he had last time.  The worst pain is that of the lower back, bilaterally, with some right lower extremity pain that goes through the back of the leg to the level of the knee.  He denies any pain below the knee.  He has a history of having had a prior bilateral lumbar facet block and SI joint block which apparently helped his low back pain and this was later followed by 2 left L4-5 LESI to help with some of the leg pain.  His last procedure was done on 03/09/2018 and he has been doing well until recently where apparently he was lifting something heavy and started having the low back pain again.  I asked him if this was similar to what he had before and he indicated that it was.  Pharmacotherapy Assessment  Analgesic: Tramadol 50 mg 1 tablet by mouth every 8 hours (150 mg/day of tramadol) (15 MME/day) MME/day: 15 mg/day.   Monitoring: [place] PMP: PDMP reviewed during this encounter.       Pharmacotherapy: No side-effects or adverse reactions reported. Compliance: No problems identified. Effectiveness: Clinically acceptable. Plan: Refer to "POC".  UDS:  Summary  Date Value Ref Range Status  08/31/2017 FINAL  Final    Comment:    ==================================================================== TOXASSURE COMP DRUG  ANALYSIS,UR ==================================================================== Test                             Result       Flag       Units Drug Present and Declared for Prescription Verification   Acetaminophen                  PRESENT      EXPECTED Drug Present not Declared for Prescription Verification   Tramadol                       >5102        UNEXPECTED ng/mg creat   O-Desmethyltramadol            5066         UNEXPECTED ng/mg creat   N-Desmethyltramadol            1382         UNEXPECTED ng/mg creat    Source of tramadol is a prescription medication.    O-desmethyltramadol and N-desmethyltramadol are expected    metabolites of tramadol. Drug Absent but Declared for Prescription Verification   Salicylate                     Not Detected UNEXPECTED    Aspirin, as indicated in the declared medication list, is not    always detected even when used as directed.   Metoprolol                     Not Detected UNEXPECTED ==================================================================== Test                      Result    Flag   Units      Ref Range  Creatinine              98               mg/dL      >=20 ==================================================================== Declared Medications:  The flagging and interpretation on this report are based on the  following declared medications.  Unexpected results may arise from  inaccuracies in the declared medications.  **Note: The testing scope of this panel includes these medications:  Metoprolol  **Note: The testing scope of this panel does not include small to  moderate amounts of these reported medications:  Acetaminophen  Aspirin  **Note: The testing scope of this panel does not include following  reported medications:  Atorvastatin  Clopidogrel  Hydrochlorothiazide (HCTZ)  Isosorbide Mononitrate  Lisinopril  Multivitamin  Nitroglycerin  Pantoprazole   Tamsulosin ==================================================================== For clinical consultation, please call [phone]. ====================================================================    Laboratory Chemistry Profile   Renal Lab Results  Component Value Date   BUN 14 07/12/2019   CREATININE 1.09 07/12/2019   BCR 13 07/12/2019   GFRAA 74 07/12/2019   GFRNONAA 64 07/12/2019    Hepatic Lab Results  Component Value Date   AST 17 01/17/2019   ALT 17 01/17/2019   ALBUMIN 4.5 01/17/2019   ALKPHOS 64 01/17/2019    Electrolytes Lab Results  Component Value Date   NA 141 07/12/2019   K 3.9 07/12/2019   CL 104 07/12/2019   CALCIUM 9.3 07/12/2019   MG 2.0 02/10/2019   PHOS 1.7 (L) 02/10/2019    Bone Lab Results  Component Value Date   25OHVITD1 31 08/31/2017   25OHVITD2 <1.0 08/31/2017   25OHVITD3 31 08/31/2017    Coagulation Lab Results  Component Value Date   INR 1.1 02/11/2019   LABPROT 14.2 02/11/2019   APTT 35 02/11/2019   PLT 101 (L) 11/23/2019    Cardiovascular Lab Results  Component Value Date   TROPONINI 0.13 (HH) 02/13/2019   HGB 14.9 11/23/2019   HCT 44.6 11/23/2019    Inflammation (CRP: Acute Phase) (ESR: Chronic Phase) Lab Results  Component Value Date   CRP 0.4 08/31/2017   ESRSEDRATE 3 08/31/2017      Note: Above Lab results reviewed.  Imaging  CUP PACEART REMOTE DEVICE CHECK Scheduled remote reviewed.  Normal device function.   5 PAF events <1% burden longest duration 4-8 secs Next remote 91 days.  Assessment  The primary encounter diagnosis was Chronic pain syndrome. Diagnoses of Chronic low back pain (Primary Area of Pain) (Bilateral) (L>R), Chronic lower extremity pain (Bilateral) (L>R), DDD (degenerative disc disease), lumbar, Lumbar facet syndrome (Bilateral) (L>R), Lumbar facet osteoarthritis, and Chronic sacroiliac joint pain (Bilateral) (L>R) were also pertinent to this visit.  Plan of Care  Problem-specific:  No  problem-specific Assessment & Plan notes found for this encounter.  I am having Nehemiah Settle. [place]um maintain his acetaminophen, aspirin EC, nitroGLYCERIN, Multiple Vitamins-Minerals (PRESERVISION AREDS PO), TURMERIC PO, Omega-3 Fatty Acids (FISH OIL PO), tamsulosin, tetrahydrozoline, atorvastatin, carvedilol, sacubitril-valsartan, pantoprazole, and isosorbide mononitrate.  Pharmacotherapy (Medications Ordered): No orders of the defined types were placed in this encounter.  Orders:  Orders Placed This Encounter  Procedures  . LUMBAR FACET(MEDIAL BRANCH NERVE BLOCK) MBNB    Standing Status:   Future    Standing Expiration Date:   02/18/2020    Scheduling Instructions:     Procedure: Lumbar facet block (AKA.: Lumbosacral medial branch nerve block)     Side: Bilateral     Level: L3-4, L4-5, & L5-S1 Facets (  L2, L3, L4, L5, & S1 Medial Branch Nerves)     Sedation: Patient's choice.     Timeframe: ASAA    Order Specific Question:   Where will this procedure be performed?    Answer:   [facility]  . SACROILIAC JOINT INJECTION    Standing Status:   Future    Standing Expiration Date:   02/18/2020    Scheduling Instructions:     Side: Bilateral     Sedation: Patient's choice.     Timeframe: ASAP    Order Specific Question:   Where will this procedure be performed?    Answer:   [facility]   Follow-up plan:   Return for Procedure (w/ sedation): (B) L-FCT BLK #2 + (B) SI BLK #2, (Blood-thinner Protocol).      Interventional management options: Planned, scheduled, and/or pending:  NOTE:Stop Plavix for7 days forprocedures.    Considering:   Possible bilateral lumbar facet RFA #1 Possible bilateral SI joint RFA #1 Diagnostic left L2 TFESI Diagnostic bilateral L3 TFESI Diagnostic bilateral hip injections   Palliative PRN treatment(s):   Diagnostic bilateral lumbar facet block #2  Diagnostic bilateral SI joint block #2  Therapeutic/palliative left L4-5 LESI #3     Recent Visits No visits were found meeting these conditions.  Showing recent visits within past 90 days and meeting all other requirements   Today's Visits Date Type Provider Dept  01/21/20 Telemedicine Milinda Pointer, MD [facility]  Showing today's visits and meeting all other requirements   Future Appointments No visits were found meeting these conditions.  Showing future appointments within next 90 days and meeting all other requirements   I discussed the assessment and treatment plan with the patient. The patient was provided an opportunity to ask questions and all were answered. The patient agreed with the plan and demonstrated an understanding of the instructions.  Patient advised to call back or seek an in-person evaluation if the symptoms or condition worsens.  Duration of encounter: 14 minutes.  Note by: Gaspar Cola, MD Date: 01/21/2020; Time: 11:42 AM

## 2020-01-21 ENCOUNTER — Ambulatory Visit: Payer: Medicare Other | Attending: Pain Medicine | Admitting: Pain Medicine

## 2020-01-21 ENCOUNTER — Telehealth: Payer: Self-pay | Admitting: *Deleted

## 2020-01-21 ENCOUNTER — Other Ambulatory Visit: Payer: Self-pay

## 2020-01-21 DIAGNOSIS — M47816 Spondylosis without myelopathy or radiculopathy, lumbar region: Secondary | ICD-10-CM

## 2020-01-21 DIAGNOSIS — M5136 Other intervertebral disc degeneration, lumbar region: Secondary | ICD-10-CM

## 2020-01-21 DIAGNOSIS — G8929 Other chronic pain: Secondary | ICD-10-CM

## 2020-01-21 DIAGNOSIS — M5442 Lumbago with sciatica, left side: Secondary | ICD-10-CM

## 2020-01-21 DIAGNOSIS — M79605 Pain in left leg: Secondary | ICD-10-CM

## 2020-01-21 DIAGNOSIS — M5441 Lumbago with sciatica, right side: Secondary | ICD-10-CM

## 2020-01-21 DIAGNOSIS — M79604 Pain in right leg: Secondary | ICD-10-CM | POA: Diagnosis not present

## 2020-01-21 DIAGNOSIS — M533 Sacrococcygeal disorders, not elsewhere classified: Secondary | ICD-10-CM

## 2020-01-21 DIAGNOSIS — G894 Chronic pain syndrome: Secondary | ICD-10-CM

## 2020-01-21 NOTE — Progress Notes (Signed)
Called patient's daughter and she confirms that he is not taking ANY blood thinners. He also does not want any sedation. Please see if we can move him to Thursday 01/24/20 and let me know if this is possible.

## 2020-01-21 NOTE — Telephone Encounter (Signed)
Called daughter. States he is NOT on any blood thinners except 81mg  ASA daily. I gave her the NPO protocol and she states she would be his driver. Scheduled and confirmed for 01/24/2020 at 0915.

## 2020-01-21 NOTE — Patient Instructions (Signed)
____________________________________________________________________________________________  Preparing for Procedure with Sedation  Procedure appointments are limited to planned procedures: . No Prescription Refills. . No disability issues will be discussed. . No medication changes will be discussed.  Instructions: . Oral Intake: Do not eat or drink anything for at least 8 hours prior to your procedure. . Transportation: Public transportation is not allowed. Bring an adult driver. The driver must be physically present in our waiting room before any procedure can be started. . Physical Assistance: Bring an adult physically capable of assisting you, in the event you need help. This adult should keep you company at home for at least 6 hours after the procedure. . Blood Pressure Medicine: Take your blood pressure medicine with a sip of water the morning of the procedure. . Blood thinners: Notify our staff if you are taking any blood thinners. Depending on which one you take, there will be specific instructions on how and when to stop it. . Diabetics on insulin: Notify the staff so that you can be scheduled 1st case in the morning. If your diabetes requires high dose insulin, take only  of your normal insulin dose the morning of the procedure and notify the staff that you have done so. . Preventing infections: Shower with an antibacterial soap the morning of your procedure. . Build-up your immune system: Take 1000 mg of Vitamin C with every meal (3 times a day) the day prior to your procedure. . Antibiotics: Inform the staff if you have a condition or reason that requires you to take antibiotics before dental procedures. . Pregnancy: If you are pregnant, call and cancel the procedure. . Sickness: If you have a cold, fever, or any active infections, call and cancel the procedure. . Arrival: You must be in the facility at least 30 minutes prior to your scheduled procedure. . Children: Do not bring  children with you. . Dress appropriately: Bring dark clothing that you would not mind if they get stained. . Valuables: Do not bring any jewelry or valuables.  Reasons to call and reschedule or cancel your procedure: (Following these recommendations will minimize the risk of a serious complication.) . Surgeries: Avoid having procedures within 2 weeks of any surgery. (Avoid for 2 weeks before or after any surgery). . Flu Shots: Avoid having procedures within 2 weeks of a flu shots or . (Avoid for 2 weeks before or after immunizations). . Barium: Avoid having a procedure within 7-10 days after having had a radiological study involving the use of radiological contrast. (Myelograms, Barium swallow or enema study). . Heart attacks: Avoid any elective procedures or surgeries for the initial 6 months after a "Myocardial Infarction" (Heart Attack). . Blood thinners: It is imperative that you stop these medications before procedures. Let us know if you if you take any blood thinner.  . Infection: Avoid procedures during or within two weeks of an infection (including chest colds or gastrointestinal problems). Symptoms associated with infections include: Localized redness, fever, chills, night sweats or profuse sweating, burning sensation when voiding, cough, congestion, stuffiness, runny nose, sore throat, diarrhea, nausea, vomiting, cold or Flu symptoms, recent or current infections. It is specially important if the infection is over the area that we intend to treat. . Heart and lung problems: Symptoms that may suggest an active cardiopulmonary problem include: cough, chest pain, breathing difficulties or shortness of breath, dizziness, ankle swelling, uncontrolled high or unusually low blood pressure, and/or palpitations. If you are experiencing any of these symptoms, cancel your procedure and contact   your primary care physician for an evaluation.  Remember:  Regular Business hours are:  Monday to Thursday  8:00 AM to 4:00 PM  Provider's Schedule: Daphine Loch, MD:  Procedure days: Tuesday and Thursday 7:30 AM to 4:00 PM  Bilal Lateef, MD:  Procedure days: Monday and Wednesday 7:30 AM to 4:00 PM ____________________________________________________________________________________________   ____________________________________________________________________________________________  Blood Thinners  IMPORTANT NOTICE:  If you take any of these, make sure to notify the nursing staff.  Failure to do so may result in injury.  Recommended time intervals to stop and restart blood-thinners, before & after invasive procedures  Generic Name Brand Name Stop Time. Must be stopped at least this long before procedures. After procedures, wait at least this long before re-starting.  Abciximab Reopro 15 days 2 hrs  Alteplase Activase 10 days 10 days  Anagrelide Agrylin    Apixaban Eliquis 3 days 6 hrs  Cilostazol Pletal 3 days 5 hrs  Clopidogrel Plavix 7-10 days 2 hrs  Dabigatran Pradaxa 5 days 6 hrs  Dalteparin Fragmin 24 hours 4 hrs  Dipyridamole Aggrenox 11days 2 hrs  Edoxaban Lixiana; Savaysa 3 days 2 hrs  Enoxaparin  Lovenox 24 hours 4 hrs  Eptifibatide Integrillin 8 hours 2 hrs  Fondaparinux  Arixtra 72 hours 12 hrs  Prasugrel Effient 7-10 days 6 hrs  Reteplase Retavase 10 days 10 days  Rivaroxaban Xarelto 3 days 6 hrs  Ticagrelor Brilinta 5-7 days 6 hrs  Ticlopidine Ticlid 10-14 days 2 hrs  Tinzaparin Innohep 24 hours 4 hrs  Tirofiban Aggrastat 8 hours 2 hrs  Warfarin Coumadin 5 days 2 hrs   Other medications with blood-thinning effects  Product indications Generic (Brand) names Note  Cholesterol Lipitor Stop 4 days before procedure  Blood thinner (injectable) Heparin (LMW or LMWH Heparin) Stop 24 hours before procedure  Cancer Ibrutinib (Imbruvica) Stop 7 days before procedure  Malaria/Rheumatoid Hydroxychloroquine (Plaquenil) Stop 11 days before procedure  Thrombolytics  10  days before or after procedures   Over-the-counter (OTC) Products with blood-thinning effects  Product Common names Stop Time  Aspirin > 325 mg Goody Powders, Excedrin, etc. 11 days  Aspirin ? 81 mg  7 days  Fish oil  4 days  Garlic supplements  7 days  Ginkgo biloba  36 hours  Ginseng  24 hours  NSAIDs Ibuprofen, Naprosyn, etc. 3 days  Vitamin E  4 days   ____________________________________________________________________________________________   

## 2020-01-24 ENCOUNTER — Ambulatory Visit
Admission: RE | Admit: 2020-01-24 | Discharge: 2020-01-24 | Disposition: A | Discharge status: Home / Self Care | Payer: Medicare Other | Source: Ambulatory Visit | Attending: Pain Medicine | Admitting: Pain Medicine

## 2020-01-24 ENCOUNTER — Encounter: Payer: Self-pay | Admitting: Pain Medicine

## 2020-01-24 ENCOUNTER — Other Ambulatory Visit: Payer: Self-pay

## 2020-01-24 ENCOUNTER — Ambulatory Visit (HOSPITAL_BASED_OUTPATIENT_CLINIC_OR_DEPARTMENT_OTHER): Payer: Medicare Other | Admitting: Pain Medicine

## 2020-01-24 VITALS — BP 154/81 | HR 68 | Temp 97.4°F | Resp 16 | Ht 69.0 in | Wt 195.0 lb

## 2020-01-24 DIAGNOSIS — M47898 Other spondylosis, sacral and sacrococcygeal region: Secondary | ICD-10-CM | POA: Diagnosis present

## 2020-01-24 DIAGNOSIS — M5441 Lumbago with sciatica, right side: Secondary | ICD-10-CM

## 2020-01-24 DIAGNOSIS — M5442 Lumbago with sciatica, left side: Secondary | ICD-10-CM | POA: Diagnosis present

## 2020-01-24 DIAGNOSIS — M47817 Spondylosis without myelopathy or radiculopathy, lumbosacral region: Secondary | ICD-10-CM

## 2020-01-24 DIAGNOSIS — M533 Sacrococcygeal disorders, not elsewhere classified: Secondary | ICD-10-CM

## 2020-01-24 DIAGNOSIS — M47816 Spondylosis without myelopathy or radiculopathy, lumbar region: Secondary | ICD-10-CM | POA: Diagnosis present

## 2020-01-24 DIAGNOSIS — G8929 Other chronic pain: Secondary | ICD-10-CM | POA: Insufficient documentation

## 2020-01-24 MED ORDER — ROPIVACAINE HCL 2 MG/ML IJ SOLN
18.0000 mL | Freq: Once | INTRAMUSCULAR | Status: AC
  Administered 2020-01-24: 18 mL via PERINEURAL
  Filled 2020-01-24: qty 20

## 2020-01-24 MED ORDER — LIDOCAINE HCL 2 % IJ SOLN
20.0000 mL | Freq: Once | INTRAMUSCULAR | Status: AC
  Administered 2020-01-24: 400 mg
  Filled 2020-01-24: qty 40

## 2020-01-24 MED ORDER — FENTANYL CITRATE (PF) 100 MCG/2ML IJ SOLN
25.0000 ug | INTRAMUSCULAR | Status: DC | PRN
  Administered 2020-01-24: 50 ug via INTRAVENOUS
  Filled 2020-01-24: qty 2

## 2020-01-24 MED ORDER — ROPIVACAINE HCL 2 MG/ML IJ SOLN
9.0000 mL | Freq: Once | INTRAMUSCULAR | Status: AC
  Administered 2020-01-24: 9 mL via INTRA_ARTICULAR
  Filled 2020-01-24: qty 10

## 2020-01-24 MED ORDER — TRIAMCINOLONE ACETONIDE 40 MG/ML IJ SUSP
80.0000 mg | Freq: Once | INTRAMUSCULAR | Status: AC
  Administered 2020-01-24: 80 mg
  Filled 2020-01-24: qty 2

## 2020-01-24 MED ORDER — METHYLPREDNISOLONE ACETATE 80 MG/ML IJ SUSP
80.0000 mg | Freq: Once | INTRAMUSCULAR | Status: AC
  Administered 2020-01-24: 10:00:00 80 mg via INTRA_ARTICULAR
  Filled 2020-01-24: qty 1

## 2020-01-24 MED ORDER — MIDAZOLAM HCL 5 MG/5ML IJ SOLN
1.0000 mg | INTRAMUSCULAR | Status: DC | PRN
  Administered 2020-01-24: 2 mg via INTRAVENOUS
  Filled 2020-01-24: qty 5

## 2020-01-24 MED ORDER — LACTATED RINGERS IV SOLN
1000.0000 mL | Freq: Once | INTRAVENOUS | Status: AC
  Administered 2020-01-24: 1000 mL via INTRAVENOUS

## 2020-01-24 NOTE — Patient Instructions (Signed)

## 2020-01-24 NOTE — Progress Notes (Signed)
Safety precautions to be maintained throughout the outpatient stay will include: orient to surroundings, keep bed in low position, maintain call bell within reach at all times, provide assistance with transfer out of bed and ambulation.  

## 2020-01-24 NOTE — Progress Notes (Signed)
PROVIDER NOTE: Information contained herein reflects review and annotations entered in association with encounter. Interpretation of such information and data should be left to medically-trained personnel. Information provided to patient can be located elsewhere in the medical record under "Patient Instructions". Document created using STT-dictation technology, any transcriptional errors that may result from process are unintentional.    Patient: Matthew [name]  Service Category: Procedure  Provider: Gaspar Cola, MD  DOB: June 15, 1936  DOS: 01/24/2020  Location: [facility]  MRN: [id number]  Setting: Ambulatory - outpatient  Referring Provider: Antionette Char, MD  Type: Established Patient  Specialty: Interventional Pain Management  PCP: Antionette Char, MD   Primary Reason for Visit: Interventional Pain Management Treatment. CC: Back Pain  Procedure:          Anesthesia, Analgesia, Anxiolysis:  Procedure #1: Type: Medial Branch Facet Block #2 Primary Purpose: Diagnostic Region: Lumbar Level: L2, L3, L4, L5, & S1 Medial Branch Level(s) Target Area: For Lumbar Facet blocks, the target is the groove formed by the junction of the transverse process and superior articular process. For the L5 dorsal ramus, the target is the notch between superior articular process and sacral ala. For the S1 dorsal ramus, the target is the superior and lateral edge of the posterior S1 Sacral foramen. Approach: Posterior, paramedial, percutaneous approach. Laterality: Bilateral  Procedure #2: Type: Sacroiliac Joint Block  #2  Primary Purpose: Diagnostic Region: Posterior Lumbosacral Level: PSIS (Posterior Superior Iliac Spine) Sacroiliac Joint Target Area: For upper sacroiliac joint block(s), the target is the superior and posterior margin of the sacroiliac joint. Approach: Ipsilateral approach. Laterality: Bilateral  Type: Moderate (Conscious) Sedation combined with Local  Anesthesia Indication(s): Analgesia and Anxiety Route: Intravenous (IV) IV Access: Secured Sedation: Meaningful verbal contact was maintained at all times during the procedure  Local Anesthetic: Lidocaine 1-2%  Position: Prone   Indications: 1. Chronic low back pain (Primary Area of Pain) (Bilateral) (L>R)   2. Lumbar facet syndrome (Bilateral) (L>R)   3. Lumbar facet osteoarthritis   4. Spondylosis without myelopathy or radiculopathy, lumbosacral region   5. Chronic sacroiliac joint pain (Bilateral) (L>R)   6. Other spondylosis, sacral and sacrococcygeal region    Pain Score: Pre-procedure: 4 /10 Post-procedure: 0-No pain/10   Pre-op Assessment:  Matthew [name] is a 84 y.o. (year old), male patient, seen today for interventional treatment. 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 Matthew [name] has a current medication list which includes the following prescription(s): acetaminophen, aspirin ec, atorvastatin, carvedilol, isosorbide mononitrate, multiple vitamins-minerals, nitroglycerin, omega-3 fatty acids, pantoprazole, sacubitril-valsartan,  tamsulosin, tetrahydrozoline, and turmeric, and the following Facility-Administered Medications: fentanyl and midazolam. His primarily concern today is the Back Pain  Initial Vital Signs:  Pulse/HCG Rate: 68ECG Heart Rate: 72 Temp: 98.1 F (36.7 C) Resp: 18 BP: (!) 161/72 SpO2: 100 %  BMI: Estimated body mass index is 28.8 kg/m as calculated from the following:   Height  as of this encounter: 5\' 9"  (1.753 m).   Weight as of this encounter: 195 lb (88.5 kg).  Risk Assessment: Allergies: Reviewed. He is allergic to ranexa [ranolazine er].  Allergy Precautions: None required Coagulopathies: Reviewed. None identified.  Blood-thinner therapy: None at this time Active Infection(s): Reviewed. None identified. Matthew [name] is afebrile  Site Confirmation: Matthew [name] was asked to confirm the procedure and laterality before marking the site Procedure checklist: Completed Consent: Before the procedure and under the influence of no sedative(s), amnesic(s), or anxiolytics, the patient was informed of the treatment options, risks and possible complications. To fulfill our ethical and legal obligations, as recommended by the American Medical Association's Code of Ethics, I have informed the patient of my clinical impression; the nature and purpose of the treatment or procedure; the risks, benefits, and possible complications of the intervention; the alternatives, including doing nothing; the risk(s) and benefit(s) of the alternative treatment(s) or procedure(s); and the risk(s) and benefit(s) of doing nothing. The patient was provided information about the general risks and possible complications associated with the procedure. These may include, but are not limited to: failure to achieve desired goals, infection, bleeding, organ or nerve damage, allergic reactions, paralysis, and death. In addition, the patient was informed of those risks and complications associated to Spine-related procedures, such as failure  to decrease pain; infection (i.e.: Meningitis, epidural or intraspinal abscess); bleeding (i.e.: epidural hematoma, subarachnoid hemorrhage, or any other type of intraspinal or peri-dural bleeding); organ or nerve damage (i.e.: Any type of peripheral nerve, nerve root, or spinal cord injury) with subsequent damage to sensory, motor, and/or autonomic systems, resulting in permanent pain, numbness, and/or weakness of one or several areas of the body; allergic reactions; (i.e.: anaphylactic reaction); and/or death. Furthermore, the patient was informed of those risks and complications associated with the medications. These include, but are not limited to: allergic reactions (i.e.: anaphylactic or anaphylactoid reaction(s)); adrenal axis suppression; blood sugar elevation that in diabetics may result in ketoacidosis or comma; water retention that in patients with history of congestive heart failure may result in shortness of breath, pulmonary edema, and decompensation with resultant heart failure; weight gain; swelling or edema; medication-induced neural toxicity; particulate matter embolism and blood vessel occlusion with resultant organ, and/or nervous system infarction; and/or aseptic necrosis of one or more joints. Finally, the patient was informed that Medicine is not an exact science; therefore, there is also the possibility of unforeseen or unpredictable risks and/or possible complications that may result in a catastrophic outcome. The patient indicated having understood very clearly. We have given the patient no guarantees and we have made no promises. Enough time was given to the patient to ask questions, all of which were answered to the patient's satisfaction. Mr. Wickland has indicated that he wanted to continue with the procedure. Attestation: I, the ordering provider, attest that I have discussed with the patient the benefits, risks, side-effects, alternatives, likelihood of achieving goals, and potential  problems during recovery for the procedure that I have provided informed consent. Date  Time: 01/24/2020  9:35 AM  Pre-Procedure Preparation:  Monitoring: As per clinic protocol. Respiration, ETCO2, SpO2, BP, heart rate and rhythm monitor placed and checked for adequate function Safety Precautions: Patient was assessed for positional comfort and pressure points before starting the procedure. Time-out: I initiated and conducted the "Time-out" before starting the procedure, as per protocol. The patient was asked to participate by confirming the accuracy of the "Time Out" information. Verification of the correct person, site,  and procedure were performed and confirmed by me, the nursing staff, and the patient. "Time-out" conducted as per Joint Commission's Universal Protocol (UP.01.01.01). Time: 1026  Description of Procedure #1:   Time-out: "Time-out" completed before starting procedure, as per protocol. Area Prepped: Entire Posterior Lumbosacral Region Prepping solution: DuraPrep (Iodine Povacrylex [0.7% available iodine] and Isopropyl Alcohol, 74% w/w) Safety Precautions: Aspiration looking for blood return was conducted prior to all injections. At no point did we inject any substances, as a needle was being advanced. No attempts were made at seeking any paresthesias. Safe injection practices and needle disposal techniques used. Medications properly checked for expiration dates. SDV (single dose vial) medications used.  Description of the Procedure: Protocol guidelines were followed. The patient was placed in position over the fluoroscopy table. The target area was identified and the area prepped in the usual manner. Skin & deeper tissues infiltrated with local anesthetic. Appropriate amount of time allowed to pass for local anesthetics to take effect. The procedure needle was introduced through the skin, ipsilateral to the reported pain, and advanced to the target area. Employing the "Medial Branch  Technique", the needles were advanced to the angle made by the superior and medial portion of the transverse process, and the lateral and inferior portion of the superior articulating process of the targeted vertebral bodies. This area is known as "Burton's Eye" or the "Eye of the [place] Dog". A procedure needle was introduced through the skin, and this time advanced to the angle made by the superior and medial border of the sacral ala, and the lateral border of the S1 vertebral body. This last needle was later repositioned at the superior and lateral border of the posterior S1 foramen. Negative aspiration confirmed. Solution injected in intermittent fashion, asking for systemic symptoms every 0.5cc of injectate. The needles were then removed and the area cleansed, making sure to leave some of the prepping solution back to take advantage of its long term bactericidal properties. Start Time: 1026 hrs. Materials:  Needle(s) Type: Spinal Needle Gauge: 22G Length: 3.5-in Medication(s): Please see orders for medications and dosing details.  Description of Procedure # 2:   Area Prepped: Entire Posterior Lumbosacral Region Prepping solution: DuraPrep (Iodine Povacrylex [0.7% available iodine] and Isopropyl Alcohol, 74% w/w) Safety Precautions: Aspiration looking for blood return was conducted prior to all injections. At no point did we inject any substances, as a needle was being advanced. No attempts were made at seeking any paresthesias. Safe injection practices and needle disposal techniques used. Medications properly checked for expiration dates. SDV (single dose vial) medications used. Description of the Procedure: Protocol guidelines were followed. The patient was placed in position over the fluoroscopy table. The target area was identified and the area prepped in the usual manner. Skin & deeper tissues infiltrated with local anesthetic. Appropriate amount of time allowed to pass for local anesthetics  to take effect. The procedure needle was advanced under fluoroscopic guidance into the sacroiliac joint until a firm endpoint was obtained. Proper needle placement secured. Negative aspiration confirmed. Solution injected in intermittent fashion, asking for systemic symptoms every 0.5cc of injectate. The needles were then removed and the area cleansed, making sure to leave some of the prepping solution back to take advantage of its long term bactericidal properties. Vitals:   01/24/20 1043 01/24/20 1053 01/24/20 1103 01/24/20 1114  BP: 132/80 (!) 163/78 (!) 159/80 (!) 154/81  Pulse:      Resp: 18 17 15 16   Temp:  (!) 97.1 F (36.2  C)  (!) 97.4 F (36.3 C)  TempSrc:      SpO2: 100% 100% 98% 97%  Weight:      Height:        End Time: 1043 hrs. Materials:  Needle(s) Type: Spinal Needle Gauge: 22G Length: 3.5-in Medication(s): Please see orders for medications and dosing details.  Imaging Guidance (Spinal):          Type of Imaging Technique: Fluoroscopy Guidance (Spinal) Indication(s): Assistance in needle guidance and placement for procedures requiring needle placement in or near specific anatomical locations not easily accessible without such assistance. Exposure Time: Please see nurses notes. Contrast: None used. Fluoroscopic Guidance: I was personally present during the use of fluoroscopy. "Tunnel Vision Technique" used to obtain the best possible view of the target area. Parallax error corrected before commencing the procedure. "Direction-depth-direction" technique used to introduce the needle under continuous pulsed fluoroscopy. Once target was reached, antero-posterior, oblique, and lateral fluoroscopic projection used confirm needle placement in all planes. Images permanently stored in EMR. Interpretation: No contrast injected. I personally interpreted the imaging intraoperatively. Adequate needle placement confirmed in multiple planes. Permanent images saved into the patient's  record.  Antibiotic Prophylaxis:   Anti-infectives (From admission, onward)   None     Indication(s): None identified  Post-operative Assessment:  Post-procedure Vital Signs:  Pulse/HCG Rate: 6880 Temp: (!) 97.4 F (36.3 C) Resp: 16 BP: (!) 154/81 SpO2: 97 %  EBL: None  Complications: No immediate post-treatment complications observed by team, or reported by patient.  Note: The patient tolerated the entire procedure well. A repeat set of vitals were taken after the procedure and the patient was kept under observation following institutional policy, for this type of procedure. Post-procedural neurological assessment was performed, showing return to baseline, prior to discharge. The patient was provided with post-procedure discharge instructions, including a section on how to identify potential problems. Should any problems arise concerning this procedure, the patient was given instructions to immediately contact us, at any time, without hesitation. In any case, we plan to contact the patient by telephone for a follow-up status report regarding this interventional procedure.  Comments:  No additional relevant information.  Plan of Care  Orders:  Orders Placed This Encounter  Procedures  . LUMBAR FACET(MEDIAL BRANCH NERVE BLOCK) MBNB    Scheduling Instructions:     Procedure: Lumbar facet block (AKA.: Lumbosacral medial branch nerve block)     Side: Bilateral     Level: L3-4, L4-5, & L5-S1 Facets (L2, L3, L4, L5, & S1 Medial Branch Nerves)     Sedation: Patient's choice.     Timeframe: Today    Order Specific Question:   Where will this procedure be performed?    Answer:   [facility]  . SACROILIAC JOINT INJECTION    Scheduling Instructions:     Side: Bilateral     Sedation: With Sedation.     Timeframe: Today    Order Specific Question:   Where will this procedure be performed?    Answer:   [facility]  . DG PAIN CLINIC C-ARM 1-60 MIN NO REPORT     Intraoperative interpretation by procedural physician at [place].    Standing Status:   Standing    Number of Occurrences:   1    Order Specific Question:   Reason for exam:    Answer:   Assistance in needle guidance and placement for procedures requiring needle placement in or near specific anatomical locations not easily  accessible without such assistance.  . Informed Consent Details: Physician/Practitioner Attestation; Transcribe to consent form and obtain patient signature    Nursing Order: Transcribe to consent form and obtain patient signature. Note: Always confirm laterality of pain with Mr. Lovette, before procedure. Procedure: Lumbar Facet Block  under fluoroscopic guidance Indication/Reason: Low Back Pain, with our without leg pain, due to Facet Joint Arthralgia (Joint Pain) known as Lumbar Facet Syndrome, secondary to Lumbar, and/or Lumbosacral Spondylosis (Arthritis of the Spine), without myelopathy or radiculopathy (Nerve Damage). Provider Attestation: I, [employer] Dossie Arbour, MD, (Pain Management Specialist), the physician/practitioner, attest that I have discussed with the patient the benefits, risks, side effects, alternatives, likelihood of achieving goals and potential problems during recovery for the procedure that I have provided informed consent.  . Care order/instruction: Please confirm that the patient has stopped the Plavix (Clopidogrel) x 7-10 days prior to procedure or surgery.    Please confirm that the patient has stopped the Plavix (Clopidogrel) x 7-10 days prior to procedure or surgery.    Standing Status:   Standing    Number of Occurrences:   1  . Provide equipment / supplies at bedside    Equipment required: Single use, disposable, "Block Tray"    Standing Status:   Standing    Number of Occurrences:   1    Order Specific Question:   Specify    Answer:   Block Tray  . Informed Consent Details: Physician/Practitioner Attestation; Transcribe to consent  form and obtain patient signature    Provider Attestation: I, [place] Dossie Arbour, MD, (Pain Management Specialist), the physician/practitioner, attest that I have discussed with the patient the benefits, risks, side effects, alternatives, likelihood of achieving goals and potential problems during recovery for the procedure that I have provided informed consent.    Scheduling Instructions:     Procedure: Sacroiliac Joint Block     Indication/Reason: Chronic Low Back and Hip Pain secondary to Sacroiliac Joint Pain (Arthralgia/Arthropathy)     Nursing Order: Transcribe to consent form and obtain patient signature.     Note: Always confirm laterality of pain with Mr. Pellecchia, before procedure.  . Bleeding precautions    Standing Status:   Standing    Number of Occurrences:   1   Chronic Opioid Analgesic:  Tramadol 50 mg 1 tablet by mouth every 8 hours (150 mg/day of tramadol) (15 MME/day) MME/day: 15 mg/day.   Medications ordered for procedure: Meds ordered this encounter  Medications  . lidocaine (XYLOCAINE) 2 % (with pres) injection 400 mg  . lactated ringers infusion 1,000 mL  . midazolam (VERSED) 5 MG/5ML injection 1-2 mg    Make sure Flumazenil is available in the pyxis when using this medication. If oversedation occurs, administer 0.2 mg IV over 15 sec. If after 45 sec no response, administer 0.2 mg again over 1 min; may repeat at 1 min intervals; not to exceed 4 doses (1 mg)  . fentaNYL (SUBLIMAZE) injection 25-50 mcg    Make sure Narcan is available in the pyxis when using this medication. In the event of respiratory depression (RR< 8/min): Titrate NARCAN (naloxone) in increments of 0.1 to 0.2 mg IV at 2-3 minute intervals, until desired degree of reversal.  . ropivacaine (PF) 2 mg/mL (0.2%) (NAROPIN) injection 18 mL  . triamcinolone acetonide (KENALOG-40) injection 80 mg  . methylPREDNISolone acetate (DEPO-MEDROL) injection 80 mg  . ropivacaine (PF) 2 mg/mL (0.2%) (NAROPIN) injection  9 mL   Medications administered: We administered lidocaine, lactated ringers,  midazolam, fentaNYL, ropivacaine (PF) 2 mg/mL (0.2%), triamcinolone acetonide, methylPREDNISolone acetate, and ropivacaine (PF) 2 mg/mL (0.2%).  See the medical record for exact dosing, route, and time of administration.  Follow-up plan:   Return in about 2 weeks (around 02/07/2020) for (VV), (PP).       Interventional management options: Planned, scheduled, and/or pending:  NOTE:Stop Plavix for7 days forprocedures.    Considering:   Possible bilateral lumbar facet RFA #1 Possible bilateral SI joint RFA #1 Diagnostic left L2 TFESI Diagnostic bilateral L3 TFESI Diagnostic bilateral hip injections   Palliative PRN treatment(s):   Diagnostic bilateral lumbar facet block #2  Diagnostic bilateral SI joint block #2  Therapeutic/palliative left L4-5 LESI #3     Recent Visits Date Type Provider Dept  01/21/20 Telemedicine Milinda Pointer, MD [facility]  Showing recent visits within past 90 days and meeting all other requirements   Today's Visits Date Type Provider Dept  01/24/20 Procedure visit Milinda Pointer, MD [facility]  Showing today's visits and meeting all other requirements   Future Appointments Date Type Provider Dept  02/07/20 Appointment Milinda Pointer, MD [facility]  Showing future appointments within next 90 days and meeting all other requirements   Disposition: Discharge home  Discharge (Date  Time): 01/24/2020; 1120 hrs.   Primary Care Physician: Antionette Char, MD Location: [facility] Note by: Gaspar Cola, MD Date: 01/24/2020; Time: 12:00 PM  Disclaimer:  Medicine is not an Chief Strategy Officer. The only guarantee in medicine is that nothing is guaranteed. It is important to note that the decision to proceed with this intervention was based on the information collected from the patient. The Data and  conclusions were drawn from the patient's questionnaire, the interview, and the physical examination. Because the information was provided in large part by the patient, it cannot be guaranteed that it has not been purposely or unconsciously manipulated. Every effort has been made to obtain as much relevant data as possible for this evaluation. It is important to note that the conclusions that lead to this procedure are derived in large part from the available data. Always take into account that the treatment will also be dependent on availability of resources and existing treatment guidelines, considered by other Pain Management Practitioners as being common knowledge and practice, at the time of the intervention. For Medico-Legal purposes, it is also important to point out that variation in procedural techniques and pharmacological choices are the acceptable norm. The indications, contraindications, technique, and results of the above procedure should only be interpreted and judged by a Board-Certified Interventional Pain Specialist with extensive familiarity and expertise in the same exact procedure and technique.

## 2020-01-25 ENCOUNTER — Telehealth: Payer: Self-pay

## 2020-01-25 NOTE — Telephone Encounter (Signed)
Post procedure phone call.  Patient states he is doing good.  

## 2020-01-29 ENCOUNTER — Ambulatory Visit: Payer: Medicare Other | Admitting: Pain Medicine

## 2020-01-29 ENCOUNTER — Ambulatory Visit (INDEPENDENT_AMBULATORY_CARE_PROVIDER_SITE_OTHER): Payer: Medicare Other | Admitting: *Deleted

## 2020-01-29 DIAGNOSIS — I5022 Chronic systolic (congestive) heart failure: Secondary | ICD-10-CM | POA: Diagnosis not present

## 2020-01-29 LAB — CUP PACEART REMOTE DEVICE CHECK
Battery Remaining Longevity: 77 mo
Battery Remaining Percentage: 95.5 %
Battery Voltage: 2.99 V
Brady Statistic AP VP Percent: 87 %
Brady Statistic AP VS Percent: 1.8 %
Brady Statistic AS VP Percent: 5.3 %
Brady Statistic AS VS Percent: 1 %
Brady Statistic RA Percent Paced: 84 %
Date Time Interrogation Session: 20210216040021
Implantable Lead Implant Date: 20200810
Implantable Lead Implant Date: 20200810
Implantable Lead Implant Date: 20200810
Implantable Lead Location: 753858
Implantable Lead Location: 753859
Implantable Lead Location: 753860
Implantable Lead Model: 5076
Implantable Lead Model: 5076
Implantable Pulse Generator Implant Date: 20200810
Lead Channel Impedance Value: 440 Ohm
Lead Channel Impedance Value: 450 Ohm
Lead Channel Impedance Value: 560 Ohm
Lead Channel Pacing Threshold Amplitude: 0.75 V
Lead Channel Pacing Threshold Amplitude: 0.75 V
Lead Channel Pacing Threshold Amplitude: 1 V
Lead Channel Pacing Threshold Pulse Width: 0.4 ms
Lead Channel Pacing Threshold Pulse Width: 0.4 ms
Lead Channel Pacing Threshold Pulse Width: 1 ms
Lead Channel Sensing Intrinsic Amplitude: 1.7 mV
Lead Channel Sensing Intrinsic Amplitude: 12 mV
Lead Channel Setting Pacing Amplitude: 2 V
Lead Channel Setting Pacing Amplitude: 2 V
Lead Channel Setting Pacing Amplitude: 2 V
Lead Channel Setting Pacing Pulse Width: 0.4 ms
Lead Channel Setting Pacing Pulse Width: 1 ms
Lead Channel Setting Sensing Sensitivity: 2 mV
Pulse Gen Model: 3562
Pulse Gen Serial Number: 9130392

## 2020-01-30 NOTE — Progress Notes (Signed)
PPM Remote  

## 2020-01-31 ENCOUNTER — Ambulatory Visit: Payer: Medicare Other | Admitting: Pain Medicine

## 2020-02-04 ENCOUNTER — Other Ambulatory Visit: Payer: Self-pay | Admitting: Internal Medicine

## 2020-02-04 MED ORDER — TAMSULOSIN HCL 0.4 MG PO CAPS: 0.4000 mg | ORAL_CAPSULE | Freq: Every day | ORAL | 0 refills | Status: DC

## 2020-02-05 ENCOUNTER — Other Ambulatory Visit: Payer: Self-pay

## 2020-02-05 MED ORDER — TAMSULOSIN HCL 0.4 MG PO CAPS: 0.4000 mg | ORAL_CAPSULE | Freq: Every day | ORAL | 1 refills | Status: DC

## 2020-02-06 ENCOUNTER — Telehealth: Payer: Self-pay | Admitting: *Deleted

## 2020-02-06 NOTE — Progress Notes (Signed)
Patient: Matthew [name]  Service Category: E/M  Provider: Gaspar Cola, MD  DOB: 1936-10-02  DOS: 02/07/2020  Location: Office  MRN: [id number]  Setting: Ambulatory outpatient  Referring Provider: Antionette Char, MD  Type: Established Patient  Specialty: Interventional Pain Management  PCP: Antionette Char, MD  Location: Remote location  Delivery: TeleHealth     Virtual Encounter - Pain Management PROVIDER NOTE: Information contained herein reflects review and annotations entered in association with encounter. Interpretation of such information and data should be left to medically-trained personnel. Information provided to patient can be located elsewhere in the medical record under "Patient Instructions". Document created using STT-dictation technology, any transcriptional errors that may result from process are unintentional.    Contact & Pharmacy Preferred: [phone] Home: [phone] (home) Mobile: [phone] (mobile) E-mail: [email or web address]  [place], [place] [address] Phone: [phone] Fax: [phone]   Pre-screening  Matthew [name] offered "in-person" vs "virtual" encounter. He indicated preferring virtual for this encounter.   Reason COVID-19*  Social distancing based on CDC and AMA recommendations.   I contacted Matthew [name] on 02/07/2020 via telephone.      I clearly identified myself as Gaspar Cola, MD. I verified that I was speaking with the correct person using two identifiers (Name: Matthew [name], and date of birth: 30-May-1936).  Consent I sought verbal advanced consent from Matthew [name] for virtual visit interactions. I informed Matthew [name] of possible security and privacy concerns, risks, and limitations associated with providing "not-in-person" medical evaluation and management services. I also informed Matthew [name] of the availability of "in-person" appointments. Finally, I  informed him that there would be a charge for the virtual visit and that he could be  personally, fully or partially, financially responsible for it. Matthew [name] expressed understanding and agreed to proceed.   Historic Elements   Matthew [name] is a 84 y.o. year old, male patient evaluated today after his last contact with our practice on 02/06/2020. Matthew [name]  has a past medical history of Alcohol abuse, Anemia, AS (aortic stenosis), CAD (coronary artery disease), Carotid bruit, Chest pain, GERD (gastroesophageal reflux disease), History of kidney stones, HOH (hard of hearing), HTN (hypertension), Hyperlipidemia, Myocardial infarction ([place]) (1996), Nephrolithiasis, OSA (obstructive sleep apnea), PAD (peripheral artery disease) ([place]) (02/2014), Shortness of breath, and Thrombocytopenia ([place]). He also  has a past surgical history that includes Stomach surgery; Coronary artery bypass graft (1991); Inguinal hernia repair (11/10/2011); left heart catheterization with coronary/graft angiogram (N/A, 03/06/2014); abdominal aortagram (N/A, 03/06/2014); Cardiac catheterization (02/2014); Cardiac catheterization (N/A, 07/31/2015); Cardiac catheterization (N/A, 07/31/2015); Cardiac catheterization (N/A, 05/26/2016); Cysto; Transcatheter aortic valve replacement, transfemoral (N/A, 06/01/2016); TEE without cardioversion (N/A, 06/01/2016); LEFT HEART CATH AND CORONARY ANGIOGRAPHY (Left, 05/15/2018); Esophagogastroduodenoscopy (egd) with propofol (N/A, 10/07/2018); Colonoscopy with propofol (N/A, 10/07/2018); Givens capsule study (N/A, 12/22/2018); enteroscopy (N/A, 12/25/2018); Colonoscopy with propofol (N/A, 01/08/2019); Laparoscopic right colectomy (Right, 02/09/2019); and BIV PACEMAKER INSERTION CRT-P (N/A, 07/23/2019). Matthew [name] has a current medication list which includes the following prescription(s): acetaminophen, aspirin ec, atorvastatin, carvedilol, isosorbide mononitrate, multiple vitamins-minerals, omega-3 fatty acids,  pantoprazole, sacubitril-valsartan, tamsulosin, tetrahydrozoline, turmeric, and nitroglycerin. He  reports that he quit smoking about 30 years ago. His smoking use included cigarettes. He has a 50.00 pack-year smoking history. He has never used smokeless tobacco. He reports that he does not drink alcohol or use drugs. Matthew [name] is allergic to ranexa [ranolazine er].  HPI  Today, he is being contacted for a post-procedure assessment.  At this time the patient seems to be doing well and he feels that we will need to do anything else.  He is still having some pain in the area of the right knee but he also refers that he can live with it.  I informed him that we can certainly help with the knee pain if he wants [place] to do that, all he needs to do is just let us know and we will take care of it.  He again rates today did that he does not need anything right now and that he will give [place] a call if the pain starts coming back.  Post-Procedure Evaluation  Procedure (01/24/2020): Diagnostic bilateral lumbar facet block #2 + diagnostic bilateral sacroiliac joint block #2 under fluoroscopic guidance and IV sedation Pre-procedure pain level:  4/10 Post-procedure: 0/10 (100% relief)  Sedation: Sedation provided.  Landis Martins, RN  02/07/2020  8:13 AM  Sign when Signing Visit Pain relief after procedure (treated area only): (Questions asked to patient) 1. Starting about 15 minutes after the procedure, and "while the area was still numb" (from the local anesthetics), were you having any of your usual pain "in that area" (the treated area)?  (NOTE: NOT including the discomfort from the needle sticks.) First 1 hour: 70 % better. First 4-6 hours: 70 % better. 2. How long did the numbness from the local anesthetics last? (More than 6 hours?) Duration:8 hours.  3. How much better is your pain now, when compared to before the procedure? Current benefit: 80 % better. 4. Can you move better now? Improvement in ROM  (Range of Motion): Yes. 5. Can you do more now? Improvement in function: Yes. 4. Did you have any problems with the procedure? Side-effects/Complications: No.   Current benefits: Defined as benefit that persist at this time.   Analgesia:  80% improved Function: Matthew [name] reports improvement in function ROM: Matthew [name] reports improvement in ROM  Pharmacotherapy Assessment  Analgesic: Tramadol 50 mg 1 tablet by mouth every 8 hours (150 mg/day of tramadol) (15 MME/day) MME/day: 15 mg/day.   Monitoring: [place] PMP: PDMP reviewed during this encounter.       Pharmacotherapy: No side-effects or adverse reactions reported. Compliance: No problems identified. Effectiveness: Clinically acceptable. Plan: Refer to "POC".  UDS:  Summary  Date Value Ref Range Status  08/31/2017 FINAL  Final    Comment:    ==================================================================== TOXASSURE COMP DRUG ANALYSIS,UR ==================================================================== Test                             Result       Flag       Units Drug Present and Declared for Prescription Verification   Acetaminophen                  PRESENT      EXPECTED Drug Present not Declared for Prescription Verification   Tramadol                       >5102        UNEXPECTED ng/mg creat   O-Desmethyltramadol            5066         UNEXPECTED ng/mg creat   N-Desmethyltramadol            1382  UNEXPECTED ng/mg creat    Source of tramadol is a prescription medication.    O-desmethyltramadol and N-desmethyltramadol are expected    metabolites of tramadol. Drug Absent but Declared for Prescription Verification   Salicylate                     Not Detected UNEXPECTED    Aspirin, as indicated in the declared medication list, is not    always detected even when used as directed.   Metoprolol                     Not Detected UNEXPECTED ==================================================================== Test                       Result    Flag   Units      Ref Range   Creatinine              98               mg/dL      >=20 ==================================================================== Declared Medications:  The flagging and interpretation on this report are based on the  following declared medications.  Unexpected results may arise from  inaccuracies in the declared medications.  **Note: The testing scope of this panel includes these medications:  Metoprolol  **Note: The testing scope of this panel does not include small to  moderate amounts of these reported medications:  Acetaminophen  Aspirin  **Note: The testing scope of this panel does not include following  reported medications:  Atorvastatin  Clopidogrel  Hydrochlorothiazide (HCTZ)  Isosorbide Mononitrate  Lisinopril  Multivitamin  Nitroglycerin  Pantoprazole  Tamsulosin ==================================================================== For clinical consultation, please call [phone]. ====================================================================    Laboratory Chemistry Profile   Renal Lab Results  Component Value Date   BUN 14 07/12/2019   CREATININE 1.09 07/12/2019   BCR 13 07/12/2019   GFRAA 74 07/12/2019   GFRNONAA 64 07/12/2019    Hepatic Lab Results  Component Value Date   AST 17 01/17/2019   ALT 17 01/17/2019   ALBUMIN 4.5 01/17/2019   ALKPHOS 64 01/17/2019    Electrolytes Lab Results  Component Value Date   NA 141 07/12/2019   K 3.9 07/12/2019   CL 104 07/12/2019   CALCIUM 9.3 07/12/2019   MG 2.0 02/10/2019   PHOS 1.7 (L) 02/10/2019    Bone Lab Results  Component Value Date   25OHVITD1 31 08/31/2017   25OHVITD2 <1.0 08/31/2017   25OHVITD3 31 08/31/2017    Inflammation (CRP: Acute Phase) (ESR: Chronic Phase) Lab Results  Component Value Date   CRP 0.4 08/31/2017   ESRSEDRATE 3 08/31/2017      Note: Above Lab results reviewed.  Imaging  CUP PACEART REMOTE DEVICE  CHECK Scheduled remote reviewed.  Normal device function.  6 AMS longest 6 seconds.   Next remote 91 days.            Assessment  The primary encounter diagnosis was Chronic low back pain (Primary Area of Pain) (Bilateral) (L>R). Diagnoses of Lumbar facet syndrome (Bilateral) (L>R) and Chronic sacroiliac joint pain (Bilateral) (L>R) were also pertinent to this visit.  Plan of Care  Problem-specific:  No problem-specific Assessment & Plan notes found for this encounter.  Mr. JULIANO MCEACHIN has a current medication list which includes the following long-term medication(s): atorvastatin, carvedilol, isosorbide mononitrate, and pantoprazole.  Pharmacotherapy (Medications Ordered): No orders of the defined types were placed in this encounter.  Orders:  No orders of the defined types were placed in this encounter.  Follow-up plan:   Return if symptoms worsen or fail to improve, for PRN Procedure(s): (B) L-FCT Blk #3 + (B) SI Blk #3, (Blood-thinner Protocol).      Interventional management options: Planned, scheduled, and/or pending:  NOTE:Stop Plavix for7 days forprocedures.    Considering:   Possible bilateral lumbar facet RFA #1 Possible bilateral SI joint RFA #1 Diagnostic left L2 TFESI Diagnostic bilateral L3 TFESI Diagnostic bilateral hip injections   Palliative PRN treatment(s):   Diagnostic bilateral lumbar facet block #3  Diagnostic bilateral SI joint block #3  Therapeutic/palliative left L4-5 LESI #3    Recent Visits Date Type Provider Dept  01/24/20 Procedure visit Milinda Pointer, MD [facility]  01/21/20 Telemedicine Milinda Pointer, MD [facility]  Showing recent visits within past 90 days and meeting all other requirements   Today's Visits Date Type Provider Dept  02/07/20 Telemedicine Milinda Pointer, MD [facility]  Showing today's visits and meeting all other requirements   Future Appointments No visits  were found meeting these conditions.  Showing future appointments within next 90 days and meeting all other requirements   I discussed the assessment and treatment plan with the patient. The patient was provided an opportunity to ask questions and all were answered. The patient agreed with the plan and demonstrated an understanding of the instructions.  Patient advised to call back or seek an in-person evaluation if the symptoms or condition worsens.  Duration of encounter: 12 minutes.  Note by: Gaspar Cola, MD Date: 02/07/2020; Time: 3:32 PM

## 2020-02-06 NOTE — Progress Notes (Signed)
Pain relief after procedure (treated area only): (Questions asked to patient) 1. Starting about 15 minutes after the procedure, and "while the area was still numb" (from the local anesthetics), were you having any of your usual pain "in that area" (the treated area)?  (NOTE: NOT including the discomfort from the needle sticks.) First 1 hour: 70 % better. First 4-6 hours: 70 % better. 2. How long did the numbness from the local anesthetics last? (More than 6 hours?) Duration:8 hours.  3. How much better is your pain now, when compared to before the procedure? Current benefit: 80 % better. 4. Can you move better now? Improvement in ROM (Range of Motion): Yes. 5. Can you do more now? Improvement in function: Yes. 4. Did you have any problems with the procedure? Side-effects/Complications: No.

## 2020-02-06 NOTE — Telephone Encounter (Signed)
Attempted to call for pre appointment review of allergies/meds. Mobile number was daughter, she was not able to answer post procedure questions. Home number was patient's number, wife answered, she said patient is outside and she cannot get him because she is immobile.

## 2020-02-07 ENCOUNTER — Ambulatory Visit: Payer: Medicare Other | Attending: Pain Medicine | Admitting: Pain Medicine

## 2020-02-07 ENCOUNTER — Other Ambulatory Visit: Payer: Self-pay

## 2020-02-07 ENCOUNTER — Encounter: Payer: Self-pay | Admitting: Pain Medicine

## 2020-02-07 DIAGNOSIS — M5441 Lumbago with sciatica, right side: Secondary | ICD-10-CM

## 2020-02-07 DIAGNOSIS — M533 Sacrococcygeal disorders, not elsewhere classified: Secondary | ICD-10-CM | POA: Diagnosis not present

## 2020-02-07 DIAGNOSIS — G8929 Other chronic pain: Secondary | ICD-10-CM

## 2020-02-07 DIAGNOSIS — Z950 Presence of cardiac pacemaker: Secondary | ICD-10-CM | POA: Insufficient documentation

## 2020-02-07 DIAGNOSIS — I447 Left bundle-branch block, unspecified: Secondary | ICD-10-CM | POA: Insufficient documentation

## 2020-02-07 DIAGNOSIS — I429 Cardiomyopathy, unspecified: Secondary | ICD-10-CM | POA: Insufficient documentation

## 2020-02-07 DIAGNOSIS — M47816 Spondylosis without myelopathy or radiculopathy, lumbar region: Secondary | ICD-10-CM

## 2020-02-07 DIAGNOSIS — I493 Ventricular premature depolarization: Secondary | ICD-10-CM | POA: Insufficient documentation

## 2020-02-07 DIAGNOSIS — M5442 Lumbago with sciatica, left side: Secondary | ICD-10-CM

## 2020-02-07 NOTE — Patient Instructions (Signed)

## 2020-02-11 ENCOUNTER — Encounter: Payer: Self-pay | Admitting: Internal Medicine

## 2020-02-11 ENCOUNTER — Ambulatory Visit: Payer: Medicare Other | Admitting: Internal Medicine

## 2020-02-11 ENCOUNTER — Other Ambulatory Visit: Payer: Self-pay

## 2020-02-11 VITALS — BP 124/72 | HR 73 | Ht 69.0 in | Wt 192.2 lb

## 2020-02-11 DIAGNOSIS — Z950 Presence of cardiac pacemaker: Secondary | ICD-10-CM

## 2020-02-11 DIAGNOSIS — I493 Ventricular premature depolarization: Secondary | ICD-10-CM

## 2020-02-11 DIAGNOSIS — I5022 Chronic systolic (congestive) heart failure: Secondary | ICD-10-CM | POA: Diagnosis not present

## 2020-02-11 DIAGNOSIS — I447 Left bundle-branch block, unspecified: Secondary | ICD-10-CM | POA: Diagnosis not present

## 2020-02-11 DIAGNOSIS — I428 Other cardiomyopathies: Secondary | ICD-10-CM | POA: Diagnosis not present

## 2020-02-11 MED ORDER — ISOSORBIDE MONONITRATE ER 60 MG PO TB24: 60.0000 mg | ORAL_TABLET | Freq: Every day | ORAL | 3 refills | Status: DC

## 2020-02-11 MED ORDER — ISOSORBIDE MONONITRATE ER 30 MG PO TB24: 30.0000 mg | ORAL_TABLET | Freq: Every day | ORAL | 3 refills | Status: DC

## 2020-02-11 NOTE — Progress Notes (Signed)
Patient Care Team: Antionette Char, MD as PCP - General (Family Medicine) Wellington Hampshire, MD as PCP - Cardiology (Cardiology) Wellington Hampshire, MD as Consulting Physician (Cardiology) Gala Romney Cristopher Estimable, MD as Consulting Physician (Gastroenterology)   HPI  Matthew [name] is a 84 y.o. male seen in follow-up for pacemaker implanted for new onset LV dysfunction following TAVR complicated by left bundle branch block.  He also had PVCs albeit they enter percentage of less than 5%.  Breathing got better following CRT-P implantation.  Over the last month or so, he has noted worsening dyspnea again.  Denies nocturnal dyspnea although he is on CPAP, abdominal distention or peripheral edema.  No new medications.  CAD with prior bypass surgery in 1991 in 2004; TAVR6/17.   DATE TEST EF   4/17 Echo  55-60%   6/19 LHC % LIMA-LAD, RIMA-D, SVG-OM3, SVG R/PD patent SVG -OM2 occluded  3/20 Echo  25-30 % LAE ( -/6.6/55)        Date Cr K Hgb Plt  3/20 0.9 3.4 10.9 109 (4/20)  6/20   13.9   9/20 1.09 (7/20) 3.9(7/20) 13.0            Date PVCs  6/19  6%  4/20 4.7  11/20 (PM)   2/21(PM) 4.7%   He is complaining of discomfort in his chest, tightness without radiation triggered by walking often feeding the chickens.  Relieved by rest recurs with further effort.  Going on a couple of weeks.  Reminiscent of prior exertional chest discomfort.  Last time was relieved by reprogramming his pacemaker.  Records and Results Reviewed   Past Medical History:  Diagnosis Date   Alcohol abuse    stopped drinking 42 years ago from today 2020   Anemia    AS (aortic stenosis)    a. Echo 6/10: EF 55% mild AS; b. echo 06/2015; EF 55-60%, GR1DD, moderate AS, Peak velocity (S): 346 cm/s. Mean gradientS): 28 mm Hg. Peak gradient (S): 48 mm Hg. Valve area (VTI): 1.18 cm2;   c. Echo 4/17 - mild LVH, EF 55-60%, no RWMA, Gr 1 DD, mod to severe AS (mean 28 mmHg, peak 46 mmHg), mild LAE    CAD (coronary artery disease)    a. MI 1996 w/ CABG 1996; b.redo CABG 06/2003; c. Myoview 7/09: EF 54% inferobasal infarct, no ischemia. Myoview 6/10 EF 43% inf wall infarct. no ischemia; c. cath 8/16 s/p DES to VG-OM2. OTW 3VD w/ patent VG->RPDA, VG->OM3,  & LIMA->LAD.  EF 55-65%.   Carotid bruit    2009 0-39% on dopplers bilatrally   Chest pain    GERD (gastroesophageal reflux disease)    History of kidney stones    HOH (hard of hearing)    HTN (hypertension)    Hyperlipidemia    Myocardial infarction ([place]) 1996   Nephrolithiasis    OSA (obstructive sleep apnea)    PAD (peripheral artery disease) ([place]) 02/2014   Subtotal occlusion of right common iliac artery and 70% stenosis in the left common iliac artery. Status post bilateral kissing stent placement. Significant post stenosis aneurysmal dilatation on the right side (any future catheterization through the right femoral artery should be done cautiously to avoid advancing the wire behind the stent struts)   Shortness of breath    with exertion   Thrombocytopenia [facility])     Past Surgical History:  Procedure Laterality Date   ABDOMINAL AORTAGRAM N/A 03/06/2014   Procedure: ABDOMINAL AORTAGRAM;  Surgeon: Wellington Hampshire, MD;  Location: [facility] CATH LAB;  Service: Cardiovascular;  Laterality: N/A;   BIV PACEMAKER INSERTION CRT-P N/A 07/23/2019   Procedure: BIV PACEMAKER INSERTION CRT-P;  Surgeon: Deboraha Sprang, MD;  Location: [place] CV LAB;  Service: Cardiovascular;  Laterality: N/A;   CARDIAC CATHETERIZATION  02/2014   Severe three-vessel coronary artery disease with patent grafts.    CARDIAC CATHETERIZATION N/A 07/31/2015   Procedure: Right/Left Heart Cath and Coronary/Graft Angiography;  Surgeon: Wellington Hampshire, MD;  Location: [place] CV LAB;  Service: Cardiovascular;  Laterality: N/A;   CARDIAC CATHETERIZATION N/A 07/31/2015   Procedure: Coronary Stent Intervention;  Surgeon: Wellington Hampshire, MD;  Location:  [place] CV LAB;  Service: Cardiovascular;  Laterality: N/A;   CARDIAC CATHETERIZATION N/A 05/26/2016   Procedure: Right/Left Heart Cath and Coronary Angiography;  Surgeon: Wellington Hampshire, MD;  Location: [place] CV LAB;  Service: Cardiovascular;  Laterality: N/A;   COLONOSCOPY WITH PROPOFOL N/A 10/07/2018   Procedure: COLONOSCOPY WITH PROPOFOL;  Surgeon: Lin Landsman, MD;  Location: [facility] ENDOSCOPY;  Service: Gastroenterology;  Laterality: N/A;   COLONOSCOPY WITH PROPOFOL N/A 01/08/2019   Procedure: COLONOSCOPY WITH PROPOFOL;  Surgeon: Lin Landsman, MD;  Location: [facility] ENDOSCOPY;  Service: Gastroenterology;  Laterality: N/A;   CORONARY ARTERY BYPASS GRAFT  1991   at [facility]. Redone 2004-3 vessels 1st time and 4 second time   CYSTO     2/3 times for kidney stones   ENTEROSCOPY N/A 12/25/2018   Procedure: ENTEROSCOPY Single balloon;  Surgeon: Lin Landsman, MD;  Location: [place];  Service: Gastroenterology;  Laterality: N/A;   ESOPHAGOGASTRODUODENOSCOPY (EGD) WITH PROPOFOL N/A 10/07/2018   Procedure: ESOPHAGOGASTRODUODENOSCOPY (EGD) WITH PROPOFOL;  Surgeon: Lin Landsman, MD;  Location: [facility] ENDOSCOPY;  Service: Gastroenterology;  Laterality: N/A;   GIVENS CAPSULE STUDY N/A 12/22/2018   Procedure: GIVENS CAPSULE STUDY;  Surgeon: Lin Landsman, MD;  Location: [facility] ENDOSCOPY;  Service: Gastroenterology;  Laterality: N/A;   INGUINAL HERNIA REPAIR  11/10/2011   Procedure: HERNIA REPAIR INGUINAL ADULT;  Surgeon: Donato Heinz;  Location: [facility] OR[facility];  Service: General;  Laterality: Right;   LAPAROSCOPIC RIGHT COLECTOMY Right 02/09/2019   Procedure: LAPAROSCOPIC RIGHT COLECTOMY;  Surgeon: Jules Husbands, MD;  Location: [facility] ORS;  Service: General;  Laterality: Right;   LEFT HEART CATH AND CORONARY ANGIOGRAPHY Left 05/15/2018   Procedure: LEFT HEART CATH AND CORONARY ANGIOGRAPHY;  Surgeon: Wellington Hampshire, MD;  Location: [place] CV LAB;  Service:  Cardiovascular;  Laterality: Left;   LEFT HEART CATHETERIZATION WITH CORONARY/GRAFT ANGIOGRAM N/A 03/06/2014   Procedure: LEFT HEART CATHETERIZATION WITH Beatrix Fetters;  Surgeon: Wellington Hampshire, MD;  Location: [place] CATH LAB;  Service: Cardiovascular;  Laterality: N/A;   STOMACH SURGERY     removal of gastric ulcers   TEE WITHOUT CARDIOVERSION N/A 06/01/2016   Procedure: TRANSESOPHAGEAL ECHOCARDIOGRAM (TEE);  Surgeon: Sherren Mocha, MD;  Location: [place];  Service: Open Heart Surgery;  Laterality: N/A;   TRANSCATHETER AORTIC VALVE REPLACEMENT, TRANSFEMORAL N/A 06/01/2016   Procedure: TRANSCATHETER AORTIC VALVE REPLACEMENT, TRANSFEMORAL;  Surgeon: Sherren Mocha, MD;  Location: [place];  Service: Open Heart Surgery;  Laterality: N/A;    Current Meds  Medication Sig   acetaminophen (TYLENOL) 325 MG tablet Take 650 mg by mouth every 6 (six) hours as needed for mild pain or headache.    aspirin EC 81 MG tablet Take 1 tablet (81 mg total) by mouth daily.   atorvastatin (  LIPITOR) 80 MG tablet Take 1 tablet (80 mg total) by mouth daily at 6 PM.   carvedilol (COREG) 6.25 MG tablet Take 1 tablet (6.25 mg total) by mouth 2 (two) times daily.   Cyanocobalamin (VITAMIN B 12 PO) Take 1 tablet by mouth daily.   Ferrous Sulfate (IRON PO) Take 1 tablet by mouth daily.   isosorbide mononitrate (IMDUR) 30 MG 24 hr tablet Take 1 tablet by mouth once daily   Multiple Vitamins-Minerals (PRESERVISION AREDS PO) Take 1 tablet by mouth daily.    Omega-3 Fatty Acids (FISH OIL PO) Take 1 capsule by mouth daily.    pantoprazole (PROTONIX) 40 MG tablet Take 1 tablet (40 mg total) by mouth daily.   sacubitril-valsartan (ENTRESTO) 97-103 MG Take 1 tablet by mouth 2 (two) times daily.   tamsulosin (FLOMAX) 0.4 MG CAPS capsule Take 1 capsule (0.4 mg total) by mouth daily. Future refill request will need to be sent to the pcp   tetrahydrozoline (VISINE) 0.05 % ophthalmic solution Place 2 drops into both  eyes 4 (four) times daily as needed (for dry eyes).   TURMERIC PO Take 1 capsule by mouth at bedtime.     Allergies  Allergen Reactions   Ranexa [Ranolazine Er] Rash      Review of Systems negative except from HPI and PMH  Physical Exam BP 124/72    Pulse 73    Ht 5\' 9"  (1.753 m)    Wt 192 lb 3.2 oz (87.2 kg)    SpO2 95%    BMI 28.38 kg/m  Well developed and well nourished in no acute distress HENT normal Neck supple with JVP-flat Clear Device pocket well healed; without hematoma or erythema.  There is no tethering  Regular rate and rhythm, no  * murmur Abd-soft with active BS No Clubbing cyanosi  edema Skin-warm and dry A & Oriented  Grossly normal sensory and motor function  ECG AV pacing with an RS in lead V1 and a QR in lead I  Assessment and  Plan  Ischemic heart disease with prior bypass  TAVI  Congestive heart failure-chronic-systolic-class III  Left bundle branch block complicating the above  Cardiomyopathy complicating the above  PVCs question burden  Anemia  CRT-P   Recurrent angina like chest pain.  From a supply demand Vantage, his heart rate is greater than 90 bpm about 12% of the time.  We will reprogram his max sensor rate, increase his threshold from 2--+0.5 and decrease the slope from 8--7.  From a supply point of view we will increase his Imdur from 30--60.  No symptoms of lightheadedness, potentially could also decrease Entresto and increase carvedilol and further increase nitrates.  His daughter and he are both reluctant to move towards catheterization so medical therapy would be preferred.  PVCs continue to be an issue but at relatively low rates estimated by his device at 4.7%.  Probably half again as much.  No evidence of volume overload     Current medicines are reviewed at length with the patient today .  The patient does not  have concerns regarding medicines.

## 2020-02-11 NOTE — Patient Instructions (Addendum)
Medication Instructions:  Your physician has recommended you make the following change in your medication: ** Increase your current prescription of  Isosorbide Monoitrate (IMDUR) from 30mg  daily to 60mg  daily (2 tablets by mouth daily)   Labwork: Dr Caryl Comes would like for you to have a CBC drawn.  You may have it drawn at any [facility].  Testing/Procedures: None ordered.  Follow-Up: Your physician wants you to follow-up in: 12 months with Dr Gari Crown will receive a reminder letter in the mail two months in advance. If you don't receive a letter, please call our office to schedule the follow-up appointment.  Remote monitoring is used to monitor your Pacemaker of ICD from home. This monitoring reduces the number of office visits required to check your device to one time per year. It allows [place] to keep an eye on the functioning of your device to ensure it is working properly.   Any Other Special Instructions Will Be Listed Below (If Applicable).  If you need a refill on your cardiac medications before your next appointment, please call your pharmacy.

## 2020-02-12 ENCOUNTER — Telehealth: Payer: Self-pay | Admitting: Internal Medicine

## 2020-02-12 DIAGNOSIS — G4733 Obstructive sleep apnea (adult) (pediatric): Secondary | ICD-10-CM

## 2020-02-12 NOTE — Telephone Encounter (Addendum)
I spoke with Dr. Caryl Comes regarding the patient's request for C-PAP supplies.  Per Dr. Caryl Comes, there are no PCP's in [facility] at this time so the patient is without one.  He was seen by Dr. Caryl Comes in the [facility] office yesterday and told to call with his supplies list.  I called [name] to confirm [employer]'s phone # for [place]. This is [phone].  Per [name], the patient will need cushions, filters, 2 bings that attaches/ mask per her report for a Dream Station made by [employer].  I advised [name] that I have never ordered these supplies so I will need to confirm with [facility] what he has gotten before. Per [name], the patient has not used [employer] as his insurance changed.   I will reach out to our sleep staff in [place] to see if they can guide me on what I need to order for this patient.

## 2020-02-12 NOTE — Telephone Encounter (Signed)
Pt daughter is calling regarding CPAP machine. States is it a Neurosurgeon made by [employer]. Supplies he needs is the cushions, includes filters, and 2 bings that attaches. Please call to discuss. Pharmacy in [place] is [place]. Phone number is [phone]

## 2020-02-14 NOTE — Telephone Encounter (Signed)
Order placed for supplies.  I called and spoke with [facility] at the number provided by the patient's daughter.   They have added the patient into the system and are aware he needs C-PAP supplies.  They state there is nothing else I need to fax at this time, but they will call if needed.  I have called and notified the patient's daughter of the above information.

## 2020-02-14 NOTE — Telephone Encounter (Signed)
Matthew [name] will be ordering patients supplies for him as we have spoken over the phone and I coached her how to order his supplies.

## 2020-02-25 ENCOUNTER — Inpatient Hospital Stay: Payer: Medicare Other | Attending: Oncology

## 2020-02-25 ENCOUNTER — Inpatient Hospital Stay: Payer: Medicare Other

## 2020-02-25 DIAGNOSIS — E538 Deficiency of other specified B group vitamins: Secondary | ICD-10-CM | POA: Diagnosis present

## 2020-02-25 DIAGNOSIS — D696 Thrombocytopenia, unspecified: Secondary | ICD-10-CM

## 2020-02-25 DIAGNOSIS — D509 Iron deficiency anemia, unspecified: Secondary | ICD-10-CM | POA: Diagnosis not present

## 2020-02-25 DIAGNOSIS — D500 Iron deficiency anemia secondary to blood loss (chronic): Secondary | ICD-10-CM

## 2020-02-25 LAB — CBC WITH DIFFERENTIAL/PLATELET
Abs Immature Granulocytes: 0.07 10*3/uL (ref 0.00–0.07)
Basophils Absolute: 0.1 10*3/uL (ref 0.0–0.1)
Basophils Relative: 1 %
Eosinophils Absolute: 0.2 10*3/uL (ref 0.0–0.5)
Eosinophils Relative: 3 %
HCT: 42.6 % (ref 39.0–52.0)
Hemoglobin: 14.5 g/dL (ref 13.0–17.0)
Immature Granulocytes: 1 %
Lymphocytes Relative: 23 %
Lymphs Abs: 1.3 10*3/uL (ref 0.7–4.0)
MCH: 31.2 pg (ref 26.0–34.0)
MCHC: 34 g/dL (ref 30.0–36.0)
MCV: 91.6 fL (ref 80.0–100.0)
Monocytes Absolute: 0.5 10*3/uL (ref 0.1–1.0)
Monocytes Relative: 8 %
Neutro Abs: 3.7 10*3/uL (ref 1.7–7.7)
Neutrophils Relative %: 64 %
Platelets: 106 10*3/uL — ABNORMAL LOW (ref 150–400)
RBC: 4.65 MIL/uL (ref 4.22–5.81)
RDW: 13.2 % (ref 11.5–15.5)
WBC: 5.9 10*3/uL (ref 4.0–10.5)
nRBC: 0 % (ref 0.0–0.2)

## 2020-02-25 LAB — IRON AND TIBC
Iron: 93 ug/dL (ref 45–182)
Saturation Ratios: 35 % (ref 17.9–39.5)
TIBC: 265 ug/dL (ref 250–450)
UIBC: 172 ug/dL

## 2020-02-25 LAB — VITAMIN B12: Vitamin B-12: 2098 pg/mL — ABNORMAL HIGH (ref 180–914)

## 2020-02-25 LAB — FERRITIN: Ferritin: 160 ng/mL (ref 24–336)

## 2020-02-25 MED ORDER — CYANOCOBALAMIN 1000 MCG/ML IJ SOLN
1000.0000 ug | INTRAMUSCULAR | Status: AC
  Administered 2020-02-25: 15:00:00 1000 ug via INTRAMUSCULAR
  Filled 2020-02-25: qty 1

## 2020-02-26 ENCOUNTER — Encounter: Payer: Self-pay | Admitting: Oncology

## 2020-02-26 ENCOUNTER — Telehealth: Payer: Self-pay | Admitting: Internal Medicine

## 2020-02-26 NOTE — Telephone Encounter (Signed)
I spoke with the patient's daughter, [name] (ok per DPR) to see how the patient's chest pain has been doing since his visit with Dr. Caryl Comes. Dr. Caryl Comes increased imdur to 60 mg once daily at that visit.  Per [name], the patient has not specifically mentioned any chest discomfort. He did mention some dizziness one day since increasing the medication, but that is it. Per [name], she will call the patient in a little bit and specifically ask him about this.  She will send a MyChart message through about how he is doing.   I also advised [name] that I saw she had sent a MyChart message about the patient's C-PAP supplies. I advised her that the day I called her, I had spoken with [facility] and they advised me I did not need to fax anything else to them at the time, but would call back if needed. [name] is aware I will follow back up with [employer] and try to fax all the documentation needed.

## 2020-02-26 NOTE — Telephone Encounter (Signed)
Matthew Hampshire, MD  Deboraha Sprang, MD; Emily Filbert, RN  Matthew [name],  Please update me when you call in 2 weeks. Thanks       Previous Messages   ----- Message -----  From: Deboraha Sprang, MD  Sent: 02/11/2020  5:31 PM EST  To: Emily Filbert, RN, Matthew Hampshire, MD   Folks. He was having exertional angina. I reprogrammed his device to slower his heart rate. Increased his Imdur. If we could contact him in about 2 weeks to see how he is feeling that would be great. If still symptomatic, he can follow-up with Dr. Gaylyn Cheers.  Thanks SK

## 2020-02-27 ENCOUNTER — Encounter: Payer: Self-pay | Admitting: *Deleted

## 2020-02-28 NOTE — Telephone Encounter (Signed)
Late entry: I faxed records (Sleep Study report & Dr. Olin Pia office note)/ C-PAP DME supply order to [place] yesterday at [phone]. Confirmation received.

## 2020-02-28 NOTE — Telephone Encounter (Signed)
I sent a MyChart message to the patient's daughter, [name], to follow up on the patient's symptoms of chest pain after our conversation on 3/16. She replied in a MyChart message as below:  Yes mam I did, he is doing much better and having less chest pain and discomfort than before Dr. Jens Som made the adjustment, Daddy is very please with this change. We only had that 1 day with that he was dizzy, and since then everything has been okay. Lynette   To Dr. Fletcher Anon Dr. Caryl Comes as an Juluis Rainier.

## 2020-02-28 NOTE — Telephone Encounter (Signed)
Ok thanks 

## 2020-03-03 NOTE — Telephone Encounter (Signed)
Noted  

## 2020-03-05 ENCOUNTER — Other Ambulatory Visit: Payer: Self-pay | Admitting: *Deleted

## 2020-03-05 MED ORDER — ISOSORBIDE MONONITRATE ER 60 MG PO TB24: 60.0000 mg | ORAL_TABLET | Freq: Every day | ORAL | 3 refills | Status: DC

## 2020-03-05 NOTE — Telephone Encounter (Signed)
Patient is requesting Imdur 90MG .  It looks like per patients medication list he is taking Imdur 60MG .   Can you please confirm which dose should be refilled?

## 2020-03-05 NOTE — Telephone Encounter (Signed)
RX refill for imdur 60 mg once daily sent to [facility].

## 2020-03-18 ENCOUNTER — Telehealth: Payer: Self-pay | Admitting: Internal Medicine

## 2020-03-18 NOTE — Telephone Encounter (Signed)
Fax received from [facility] requesting documentation for: - face to face visit with presribing MD - dx of OSA - Compliant use of PAP - Benefiting from therapy  Copy of the DME CPAP order from Epic, Dr. Olin Pia office note from 02/11/20, and copy of sleep study report from 01/25/18 faxed to [phone]. Confirmation received.

## 2020-03-27 ENCOUNTER — Other Ambulatory Visit: Payer: Self-pay

## 2020-03-27 ENCOUNTER — Encounter: Payer: Self-pay | Admitting: Cardiovascular Disease

## 2020-03-27 ENCOUNTER — Ambulatory Visit (INDEPENDENT_AMBULATORY_CARE_PROVIDER_SITE_OTHER): Payer: Medicare Other | Admitting: Cardiovascular Disease

## 2020-03-27 VITALS — BP 94/60 | HR 70 | Ht 69.0 in | Wt 189.2 lb

## 2020-03-27 DIAGNOSIS — I25118 Atherosclerotic heart disease of native coronary artery with other forms of angina pectoris: Secondary | ICD-10-CM | POA: Diagnosis not present

## 2020-03-27 DIAGNOSIS — I10 Essential (primary) hypertension: Secondary | ICD-10-CM

## 2020-03-27 DIAGNOSIS — I739 Peripheral vascular disease, unspecified: Secondary | ICD-10-CM

## 2020-03-27 DIAGNOSIS — I5022 Chronic systolic (congestive) heart failure: Secondary | ICD-10-CM

## 2020-03-27 DIAGNOSIS — Z952 Presence of prosthetic heart valve: Secondary | ICD-10-CM | POA: Diagnosis not present

## 2020-03-27 MED ORDER — ENTRESTO 49-51 MG PO TABS: 1.0000 | ORAL_TABLET | Freq: Two times a day (BID) | ORAL | 1 refills | Status: DC

## 2020-03-27 NOTE — Progress Notes (Signed)
Cardiology Office Note   Date:  03/27/2020   ID:  Matthew [name], DOB 05-24-1936, MRN [id number]  PCP:  Antionette Char, MD  Cardiologist:   [name], MD   Chief Complaint  Patient presents with  . other    4 mth f/u      History of Present Illness: Matthew [name] is a 84 y.o. male who presents for a follow-up visit regarding coronary artery disease , aortic stenosis, chronic systolic heart failure and peripheral arterial disease. He is status post CABG twice in 1991 and 2004. He is status post bilateral common iliac artery kissing stent placement in 2015. He is status post TAVR in June 2017. Cardiac catheterization before that showed patent grafts. He developed left bundle branch block post TAVR and this correlated with a gradual decline in his ejection fraction.  Before TAVR, his EF was 55 to 60%.  Shortly after TAVR, his EF was 55%.  In 2018, his EF was noted to be 45 to 50% and then dropped to 30 to 35% in 2019.  Most recent cardiac catheterization was in 2019 for worsening angina.  It showed significant underlying three-vessel coronary artery disease with patent LIMA to LAD, patent RIMA to diagonal, patent SVG to OM 3 and patent SVG to right PDA.  The SVG to OM 2 was found to be occluded.  However, OM 2 had retrograde flow from SVG to 3.  There was possible left subclavian artery stenosis but it was not significant by gradient.  This was also interrogated by carotid Doppler and there was nothing to suggest significant left subclavian stenosis. He has known history of frequent PVCs that improved with treatment.  He was hospitalized in October, 2019 with GI bleed and blood loss anemia.  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  His anemia improved with IV iron.  Most recent echocardiogram in March of 2020 showed an EF of 25 to 30%, mild aortic stenosis with mean gradient of 17 mmHg and mild to moderate aortic regurgitation. The patient had biventricular pacemaker  placement in August 2020 due to persistently low ejection fraction and left bundle branch block.  The patient had significant symptomatic improvement after that. He did have some angina that improved with addition of Imdur.  The dose was increased to 60 mg in addition we did increase the dose of Entresto during last visit.  Since then, the patient had increased dizziness but reports improvement in shortness of breath and chest pain.  Past Medical History:  Diagnosis Date  . Alcohol abuse    stopped drinking 42 years ago from today 2020  . Anemia   . AS (aortic stenosis)    a. Echo 6/10: EF 55% mild AS; b. echo 06/2015; EF 55-60%, GR1DD, moderate AS, Peak velocity (S): 346 cm/s. Mean gradientS): 28 mm Hg. Peak gradient (S): 48 mm Hg. Valve area (VTI): 1.18 cm2;   c. Echo 4/17 - mild LVH, EF 55-60%, no RWMA, Gr 1 DD, mod to severe AS (mean 28 mmHg, peak 46 mmHg), mild LAE  . CAD (coronary artery disease)    a. MI 1996 w/ CABG 1996; b.redo CABG 06/2003; c. Myoview 7/09: EF 54% inferobasal infarct, no ischemia. Myoview 6/10 EF 43% inf wall infarct. no ischemia; c. cath 8/16 s/p DES to VG-OM2. OTW 3VD w/ patent VG->RPDA, VG->OM3,  & LIMA->LAD.  EF 55-65%.  . Carotid bruit    2009 0-39% on dopplers bilatrally  . Chest pain   .  GERD (gastroesophageal reflux disease)   . History of kidney stones   . HOH (hard of hearing)   . HTN (hypertension)   . Hyperlipidemia   . Myocardial infarction ([place]) 1996  . Nephrolithiasis   . OSA (obstructive sleep apnea)   . PAD (peripheral artery disease) ([place]) 02/2014   Subtotal occlusion of right common iliac artery and 70% stenosis in the left common iliac artery. Status post bilateral kissing stent placement. 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 Shortness of breath    with exertion  .  Thrombocytopenia ([place])     Past Surgical History:  Procedure Laterality Date  . ABDOMINAL AORTAGRAM N/A 03/06/2014   Procedure: ABDOMINAL Maxcine Ham;  Surgeon: Wellington Hampshire, MD;  Location: [place] CATH LAB;  Service: Cardiovascular;  Laterality: N/A;  . BIV PACEMAKER INSERTION CRT-P N/A 07/23/2019   Procedure: BIV PACEMAKER INSERTION CRT-P;  Surgeon: Deboraha Sprang, MD;  Location: [place] CV LAB;  Service: Cardiovascular;  Laterality: N/A;  . CARDIAC CATHETERIZATION  02/2014   Severe three-vessel coronary artery disease with patent grafts.   Marland Kitchen CARDIAC CATHETERIZATION N/A 07/31/2015   Procedure: Right/Left Heart Cath and Coronary/Graft Angiography;  Surgeon: Wellington Hampshire, MD;  Location: [place] CV LAB;  Service: Cardiovascular;  Laterality: N/A;  . CARDIAC CATHETERIZATION N/A 07/31/2015   Procedure: Coronary Stent Intervention;  Surgeon: Wellington Hampshire, MD;  Location: [place] CV LAB;  Service: Cardiovascular;  Laterality: N/A;  . CARDIAC CATHETERIZATION N/A 05/26/2016   Procedure: Right/Left Heart Cath and Coronary Angiography;  Surgeon: Wellington Hampshire, MD;  Location: [place] CV LAB;  Service: Cardiovascular;  Laterality: N/A;  . COLONOSCOPY WITH PROPOFOL N/A 10/07/2018   Procedure: COLONOSCOPY WITH PROPOFOL;  Surgeon: Lin Landsman, MD;  Location: [facility] ENDOSCOPY;  Service: Gastroenterology;  Laterality: N/A;  . COLONOSCOPY WITH PROPOFOL N/A 01/08/2019   Procedure: COLONOSCOPY WITH PROPOFOL;  Surgeon: Lin Landsman, MD;  Location: [facility] ENDOSCOPY;  Service: Gastroenterology;  Laterality: N/A;  . CORONARY ARTERY BYPASS GRAFT  1991   at [facility]. Redone 2004-3 vessels 1st time and 4 second time  . CYSTO     2/3 times for kidney stones  . ENTEROSCOPY N/A 12/25/2018   Procedure: ENTEROSCOPY Single balloon;  Surgeon: Lin Landsman, MD;  Location: [place];  Service: Gastroenterology;  Laterality: N/A;  . ESOPHAGOGASTRODUODENOSCOPY (EGD) WITH PROPOFOL N/A  10/07/2018   Procedure: ESOPHAGOGASTRODUODENOSCOPY (EGD) WITH PROPOFOL;  Surgeon: Lin Landsman, MD;  Location: [facility] ENDOSCOPY;  Service: Gastroenterology;  Laterality: N/A;  . GIVENS CAPSULE STUDY N/A 12/22/2018   Procedure: GIVENS CAPSULE STUDY;  Surgeon: Lin Landsman, MD;  Location: [facility] ENDOSCOPY;  Service: Gastroenterology;  Laterality: N/A;  . INGUINAL HERNIA REPAIR  11/10/2011   Procedure: HERNIA REPAIR INGUINAL ADULT;  Surgeon: Donato Heinz;  Location: [facility];  Service: General;  Laterality: Right;  . LAPAROSCOPIC RIGHT COLECTOMY Right 02/09/2019   Procedure: LAPAROSCOPIC RIGHT COLECTOMY;  Surgeon: Jules Husbands, MD;  Location: [facility] ORS;  Service: General;  Laterality: Right;  . LEFT HEART CATH AND CORONARY ANGIOGRAPHY Left 05/15/2018   Procedure: LEFT HEART CATH AND CORONARY ANGIOGRAPHY;  Surgeon: Wellington Hampshire, MD;  Location: [place] CV LAB;  Service: Cardiovascular;  Laterality: Left;  . LEFT HEART CATHETERIZATION WITH CORONARY/GRAFT ANGIOGRAM N/A 03/06/2014   Procedure: LEFT HEART CATHETERIZATION WITH CORONARY/GRAFT ANGIOGRAM;  Surgeon: Wellington Hampshire, MD;  Location: [facility] CATH LAB;  Service: Cardiovascular;  Laterality: N/A;  . STOMACH SURGERY     removal of gastric ulcers  . TEE WITHOUT CARDIOVERSION N/A 06/01/2016   Procedure: TRANSESOPHAGEAL ECHOCARDIOGRAM (TEE);  Surgeon: Sherren Mocha, MD;  Location: [place];  Service: Open Heart Surgery;  Laterality: N/A;  . TRANSCATHETER AORTIC VALVE REPLACEMENT, TRANSFEMORAL N/A 06/01/2016   Procedure: TRANSCATHETER AORTIC VALVE REPLACEMENT, TRANSFEMORAL;  Surgeon: Sherren Mocha, MD;  Location: [place];  Service: Open Heart Surgery;  Laterality: N/A;     Current Outpatient Medications  Medication Sig Dispense Refill  . acetaminophen (TYLENOL) 325 MG tablet Take 650 mg by mouth every 6 (six) hours as needed for mild pain or headache.     Marland Kitchen aspirin EC 81 MG tablet Take 1 tablet (81 mg total) by mouth daily. 90 tablet 3  .  atorvastatin (LIPITOR) 80 MG tablet Take 1 tablet (80 mg total) by mouth daily at 6 PM. 90 tablet 1  . carvedilol (COREG) 6.25 MG tablet Take 1 tablet (6.25 mg total) by mouth 2 (two) times daily. 180 tablet 1  . Cyanocobalamin (VITAMIN B 12 PO) Take 1 tablet by mouth daily.    . Ferrous Sulfate (IRON PO) Take 1 tablet by mouth daily.    . isosorbide mononitrate (IMDUR) 60 MG 24 hr tablet Take 1 tablet (60 mg total) by mouth daily. 90 tablet 3  . Multiple Vitamins-Minerals (PRESERVISION AREDS PO) Take 1 tablet by mouth daily.     . Omega-3 Fatty Acids (FISH OIL PO) Take 1 capsule by mouth daily.     . pantoprazole (PROTONIX) 40 MG tablet Take 1 tablet (40 mg total) by mouth daily. 90 tablet 1  . sacubitril-valsartan (ENTRESTO) 97-103 MG Take 1 tablet by mouth 2 (two) times daily. 180 tablet 1  . tamsulosin (FLOMAX) 0.4 MG CAPS capsule Take 1 capsule (0.4 mg total) by mouth daily. Future refill request will need to be sent to the pcp 90 capsule 1  . tetrahydrozoline (VISINE) 0.05 % ophthalmic solution Place 2 drops into both eyes 4 (four) times daily as needed (for dry eyes).    . TURMERIC PO Take 1 capsule by mouth at bedtime.      No current facility-administered medications for this visit.   Facility-Administered Medications Ordered in Other Visits  Medication Dose Route Frequency Provider Last Rate Last Admin  . cyanocobalamin ((VITAMIN B-12)) injection 1,000 mcg  1,000 mcg Intramuscular Weekly Sindy Guadeloupe, MD   1,000 mcg at 02/25/20 1447    Allergies:   Ranexa [ranolazine er]    Social History:  The patient  reports that he quit smoking about 30 years ago. His smoking use included cigarettes. He has a 50.00 pack-year smoking history. He has never used smokeless tobacco. He reports that he does not drink alcohol or use drugs.   Family History:  The patient's family history includes Heart disease in his father and mother.    ROS:  Please see the history of present illness.    Otherwise, review of systems are positive for none.   All other systems are reviewed and negative.    PHYSICAL EXAM: VS:  BP 94/60   Pulse 70   Ht 5\' 9"  (1.753 m)   Wt 189 lb 3.2 oz (85.8 kg)   SpO2 95%   BMI 27.94 kg/m  , BMI Body mass index is 27.94 kg/m. GEN: Well nourished, well developed, in no acute distress  HEENT: normal  Neck: no JVD, carotid bruits, or masses Cardiac: RRR; no rubs, or gallops,no edema .   2/6 crescendo decrescendo aortic murmur which is early peaking. Respiratory:  clear to auscultation bilaterally, normal work of breathing GI: soft, nontender, nondistended, + BS MS: no deformity or atrophy  Skin: warm and dry, no rash Neuro:  Strength and sensation are intact Psych: euthymic mood, full affect   EKG:  EKG is ordered today. The ekg ordered today demonstrates AV sequential pacemaker.   Recent Labs: 07/12/2019: BUN 14; Creatinine, Ser 1.09; Potassium 3.9; Sodium 141 02/25/2020: Hemoglobin 14.5; Platelets 106    Lipid Panel    Component Value Date/Time   CHOL 122 10/02/2018 0847   CHOL 160 04/11/2018 1451   TRIG 147 10/02/2018 0847   HDL 30 (L) 10/02/2018 0847   HDL 44 04/11/2018 1451   CHOLHDL 4.1 10/02/2018 0847   VLDL 29 10/02/2018 0847   LDLCALC 63 10/02/2018 0847   LDLCALC 63 04/11/2018 1451      Wt Readings from Last 3 Encounters:  03/27/20 189 lb 3.2 oz (85.8 kg)  02/11/20 192 lb 3.2 oz (87.2 kg)  01/24/20 195 lb (88.5 kg)        ASSESSMENT AND PLAN:  1.  S/p TAVR  for severe symptomatic aortic stenosis: Most recent echocardiogram in March 2020 showed mildly elevated gradient at 17 mmHg with mild to moderate aortic regurgitation.  He is currently New York Heart Association class II.  We will consider repeat echocardiogram later this year to evaluate his aortic valve and also to evaluate his ejection fraction.  2. Coronary artery disease involving graft bypass with stable angina: No recent anginal symptoms and he seems to have  improved with medical therapy and the addition of Imdur.  3.  Chronic systolic heart failure: Most recent ejection fraction was 25 to 30% but since then he underwent CRT with improvement in symptoms.  The patient is hypotensive and is having frequent dizziness.  I elected to increase Entresto back to 49/51 mg twice daily.  4. Essential hypertension: Blood pressure is low.  Entresto was decreased.  5. Peripheral arterial disease: No recurrent claudication since bilateral iliac stenting.   6. Hyperlipidemia: Continue high-dose atorvastatin with a target LDL of less than 70.  Most recent LDL was 63.   Disposition:   FU with me in 4 months  Signed,  [name], MD  03/27/2020 4:31 PM    [facility]

## 2020-03-27 NOTE — Patient Instructions (Addendum)
Medication Instructions:  Your physician has recommended you make the following change in your medication:   DECREASE Entresto to 49/51 mg twice daily. An Rx has been sent to [facility]  *If you need a refill on your cardiac medications before your next appointment, please call your pharmacy*   Lab Work: None ordered If you have labs (blood work) drawn today and your tests are completely normal, you will receive your results only by: Marland Kitchen MyChart Message (if you have MyChart) OR . A paper copy in the mail If you have any lab test that is abnormal or we need to change your treatment, we will call you to review the results.   Testing/Procedures: None ordered   Follow-Up: At [facility], you and your health needs are our priority.  As part of our continuing mission to provide you with exceptional heart care, we have created designated Provider Care Teams.  These Care Teams include your primary Cardiologist (physician) and Advanced Practice Providers (APPs -  Physician Assistants and Nurse Practitioners) who all work together to provide you with the care you need, when you need it.  We recommend signing up for the patient portal called "MyChart".  Sign up information is provided on this After Visit Summary.  MyChart is used to connect with patients for Virtual Visits (Telemedicine).  Patients are able to view lab/test results, encounter notes, upcoming appointments, etc.  Non-urgent messages can be sent to your provider as well.   To learn more about what you can do with MyChart, go to [email or web address].    Your next appointment:   4 month(s)  The format for your next appointment:   In Person  Provider:    You may see [name], MD or one of the following Advanced Practice Providers on your designated Care Team:    Murray Hodgkins, NP  Christell Faith, PA-C  Marrianne Mood, PA-C    Other Instructions N/A

## 2020-04-29 ENCOUNTER — Ambulatory Visit (INDEPENDENT_AMBULATORY_CARE_PROVIDER_SITE_OTHER): Payer: Medicare Other | Admitting: *Deleted

## 2020-04-29 DIAGNOSIS — I428 Other cardiomyopathies: Secondary | ICD-10-CM

## 2020-04-29 DIAGNOSIS — I447 Left bundle-branch block, unspecified: Secondary | ICD-10-CM | POA: Diagnosis not present

## 2020-04-29 LAB — CUP PACEART REMOTE DEVICE CHECK
Battery Remaining Longevity: 76 mo
Battery Remaining Percentage: 95.5 %
Battery Voltage: 2.99 V
Brady Statistic AP VP Percent: 81 %
Brady Statistic AP VS Percent: 1 %
Brady Statistic AS VP Percent: 14 %
Brady Statistic AS VS Percent: 1 %
Brady Statistic RA Percent Paced: 78 %
Date Time Interrogation Session: 20210518040009
Implantable Lead Implant Date: 20200810
Implantable Lead Implant Date: 20200810
Implantable Lead Implant Date: 20200810
Implantable Lead Location: 753858
Implantable Lead Location: 753859
Implantable Lead Location: 753860
Implantable Lead Model: 5076
Implantable Lead Model: 5076
Implantable Pulse Generator Implant Date: 20200810
Lead Channel Impedance Value: 410 Ohm
Lead Channel Impedance Value: 460 Ohm
Lead Channel Impedance Value: 480 Ohm
Lead Channel Pacing Threshold Amplitude: 0.75 V
Lead Channel Pacing Threshold Amplitude: 1 V
Lead Channel Pacing Threshold Amplitude: 1 V
Lead Channel Pacing Threshold Pulse Width: 0.4 ms
Lead Channel Pacing Threshold Pulse Width: 0.4 ms
Lead Channel Pacing Threshold Pulse Width: 1 ms
Lead Channel Sensing Intrinsic Amplitude: 1.6 mV
Lead Channel Sensing Intrinsic Amplitude: 12 mV
Lead Channel Setting Pacing Amplitude: 2 V
Lead Channel Setting Pacing Amplitude: 2 V
Lead Channel Setting Pacing Amplitude: 2 V
Lead Channel Setting Pacing Pulse Width: 0.4 ms
Lead Channel Setting Pacing Pulse Width: 1 ms
Lead Channel Setting Sensing Sensitivity: 2 mV
Pulse Gen Model: 3562
Pulse Gen Serial Number: 9130392

## 2020-04-30 NOTE — Progress Notes (Signed)
Remote pacemaker transmission.   

## 2020-05-26 ENCOUNTER — Inpatient Hospital Stay: Payer: Medicare Other | Admitting: Oncology

## 2020-05-26 ENCOUNTER — Other Ambulatory Visit: Payer: Self-pay

## 2020-05-26 ENCOUNTER — Inpatient Hospital Stay: Payer: Medicare Other | Attending: Oncology

## 2020-05-26 VITALS — BP 147/85 | HR 70 | Temp 98.4°F | Resp 18 | Wt 200.4 lb

## 2020-05-26 DIAGNOSIS — D696 Thrombocytopenia, unspecified: Secondary | ICD-10-CM | POA: Diagnosis not present

## 2020-05-26 DIAGNOSIS — E538 Deficiency of other specified B group vitamins: Secondary | ICD-10-CM | POA: Diagnosis not present

## 2020-05-26 DIAGNOSIS — D509 Iron deficiency anemia, unspecified: Secondary | ICD-10-CM

## 2020-05-26 DIAGNOSIS — I350 Nonrheumatic aortic (valve) stenosis: Secondary | ICD-10-CM | POA: Diagnosis not present

## 2020-05-26 LAB — CBC WITH DIFFERENTIAL/PLATELET
Abs Immature Granulocytes: 0.03 10*3/uL (ref 0.00–0.07)
Basophils Absolute: 0.1 10*3/uL (ref 0.0–0.1)
Basophils Relative: 1 %
Eosinophils Absolute: 0.2 10*3/uL (ref 0.0–0.5)
Eosinophils Relative: 2 %
HCT: 39.4 % (ref 39.0–52.0)
Hemoglobin: 13.6 g/dL (ref 13.0–17.0)
Immature Granulocytes: 1 %
Lymphocytes Relative: 24 %
Lymphs Abs: 1.5 10*3/uL (ref 0.7–4.0)
MCH: 31.6 pg (ref 26.0–34.0)
MCHC: 34.5 g/dL (ref 30.0–36.0)
MCV: 91.4 fL (ref 80.0–100.0)
Monocytes Absolute: 0.6 10*3/uL (ref 0.1–1.0)
Monocytes Relative: 9 %
Neutro Abs: 4 10*3/uL (ref 1.7–7.7)
Neutrophils Relative %: 63 %
Platelets: 98 10*3/uL — ABNORMAL LOW (ref 150–400)
RBC: 4.31 MIL/uL (ref 4.22–5.81)
RDW: 12.3 % (ref 11.5–15.5)
WBC: 6.3 10*3/uL (ref 4.0–10.5)
nRBC: 0 % (ref 0.0–0.2)

## 2020-05-26 LAB — IRON AND TIBC
Iron: 70 ug/dL (ref 45–182)
Saturation Ratios: 27 % (ref 17.9–39.5)
TIBC: 265 ug/dL (ref 250–450)
UIBC: 195 ug/dL

## 2020-05-26 LAB — FERRITIN: Ferritin: 93 ng/mL (ref 24–336)

## 2020-05-26 LAB — VITAMIN B12: Vitamin B-12: 2358 pg/mL — ABNORMAL HIGH (ref 180–914)

## 2020-05-26 NOTE — Progress Notes (Signed)
Pt in for follow up, denies any concerns.   

## 2020-05-27 IMAGING — DX CHEST - 2 VIEW
3 series · 3 of 3 positions shown · non-contrast
Comparison: 07/23/2019.

CLINICAL DATA: AICD.

EXAM:
CHEST - 2 VIEW

[chest pa]
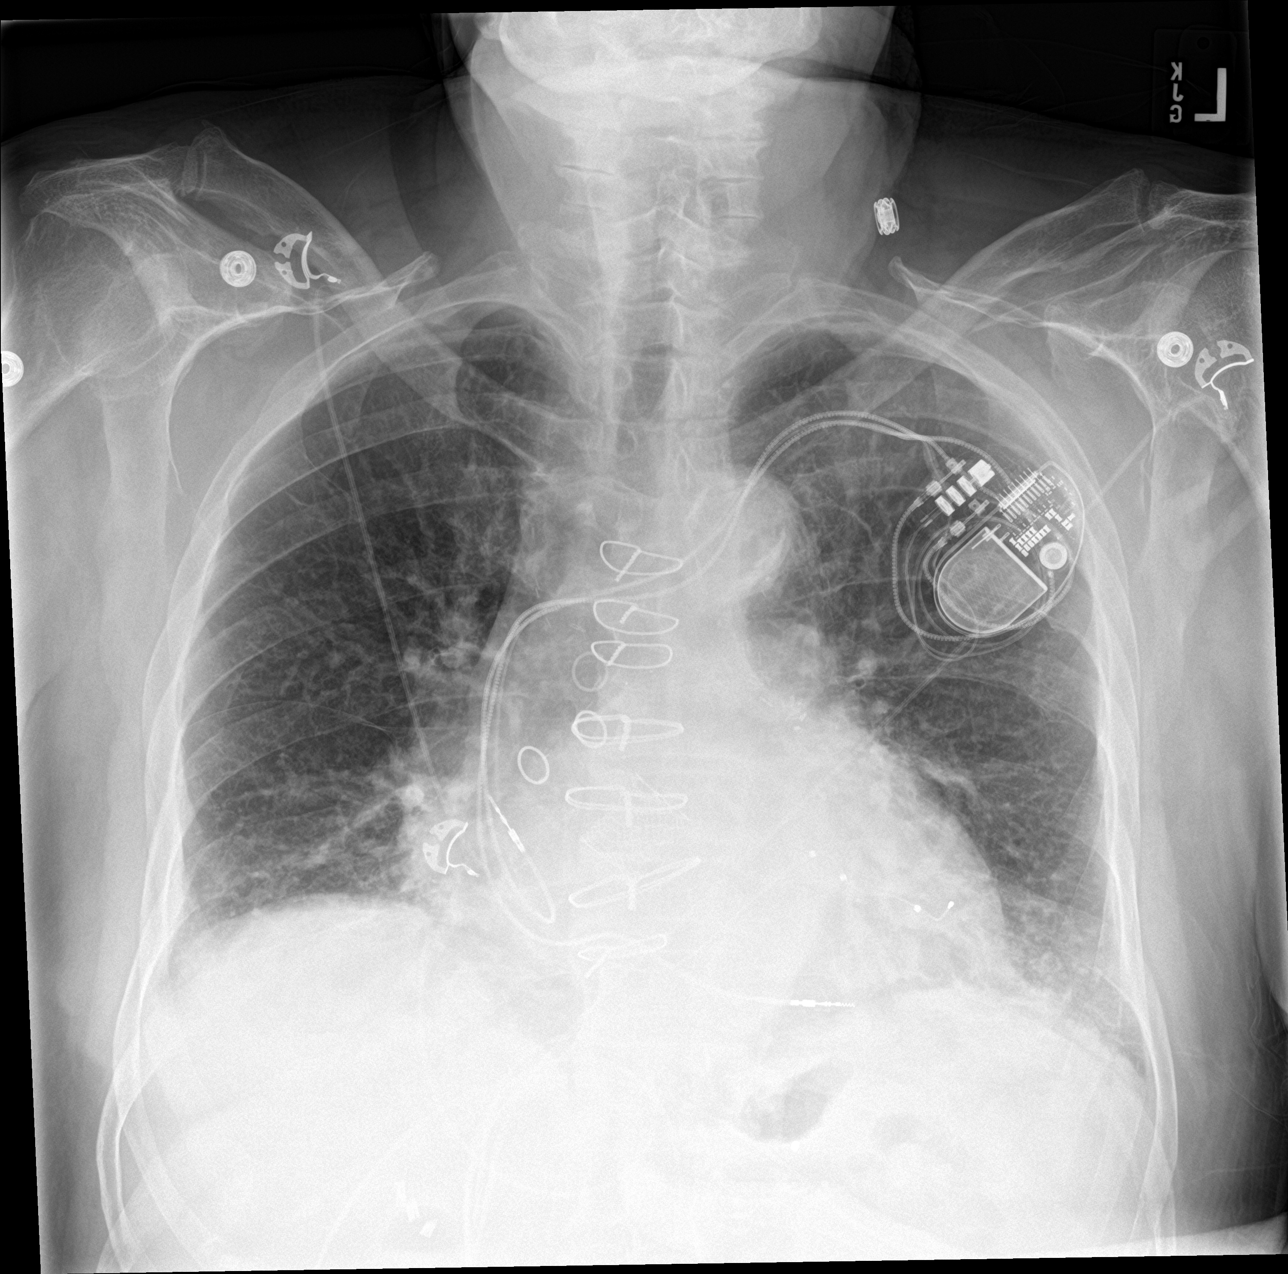

[chest lat (1 of 2)]
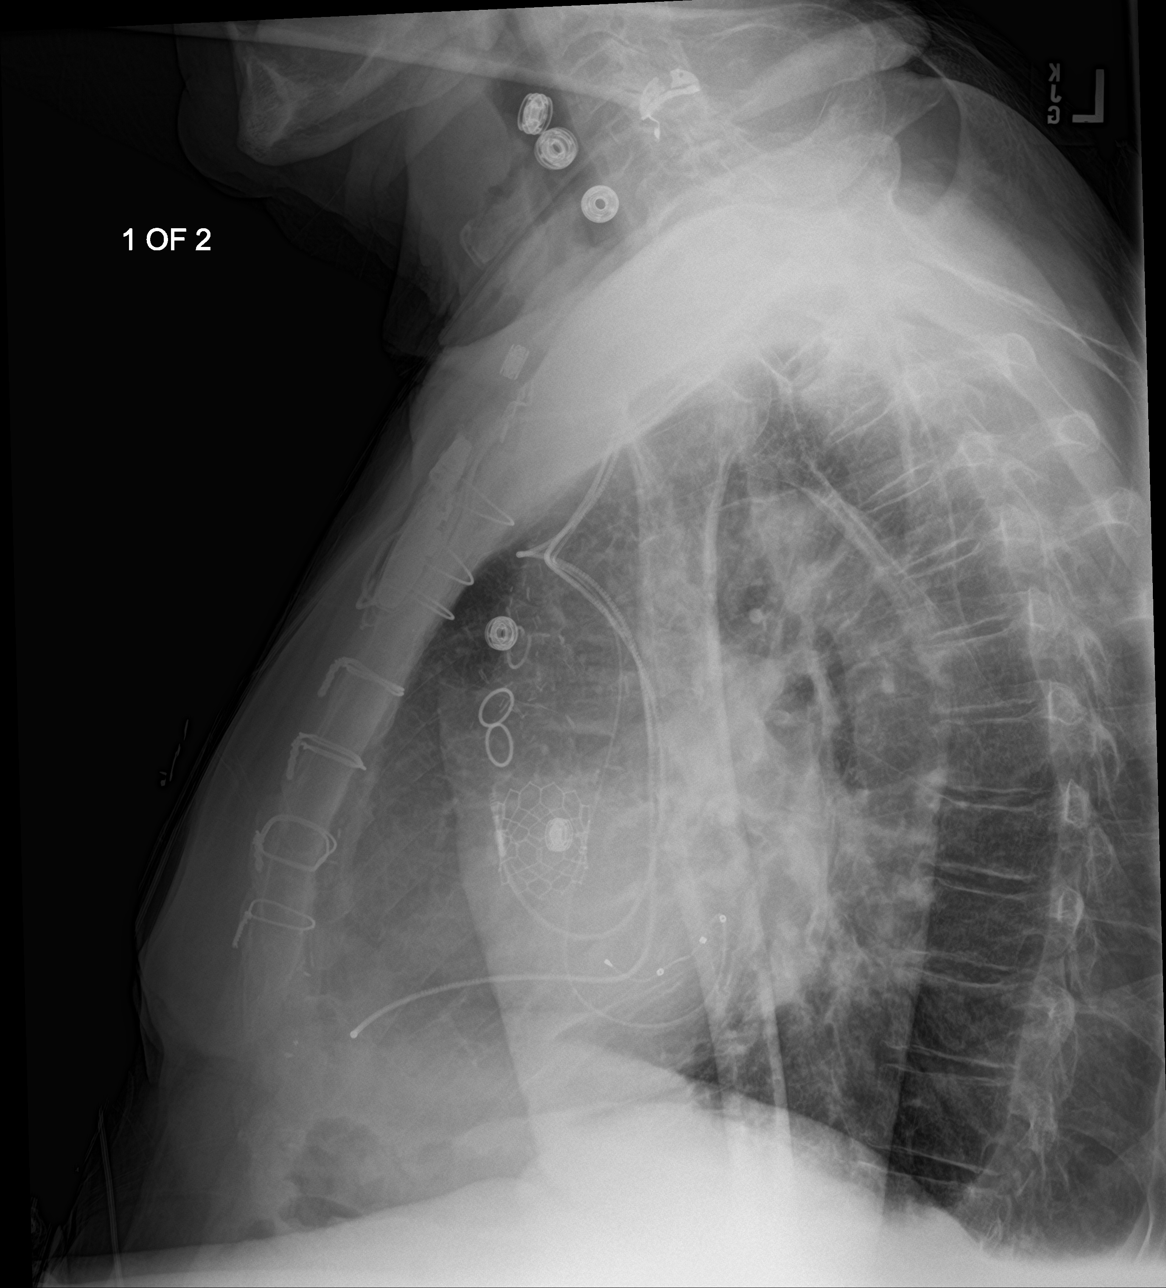

[chest lat (2 of 2)]
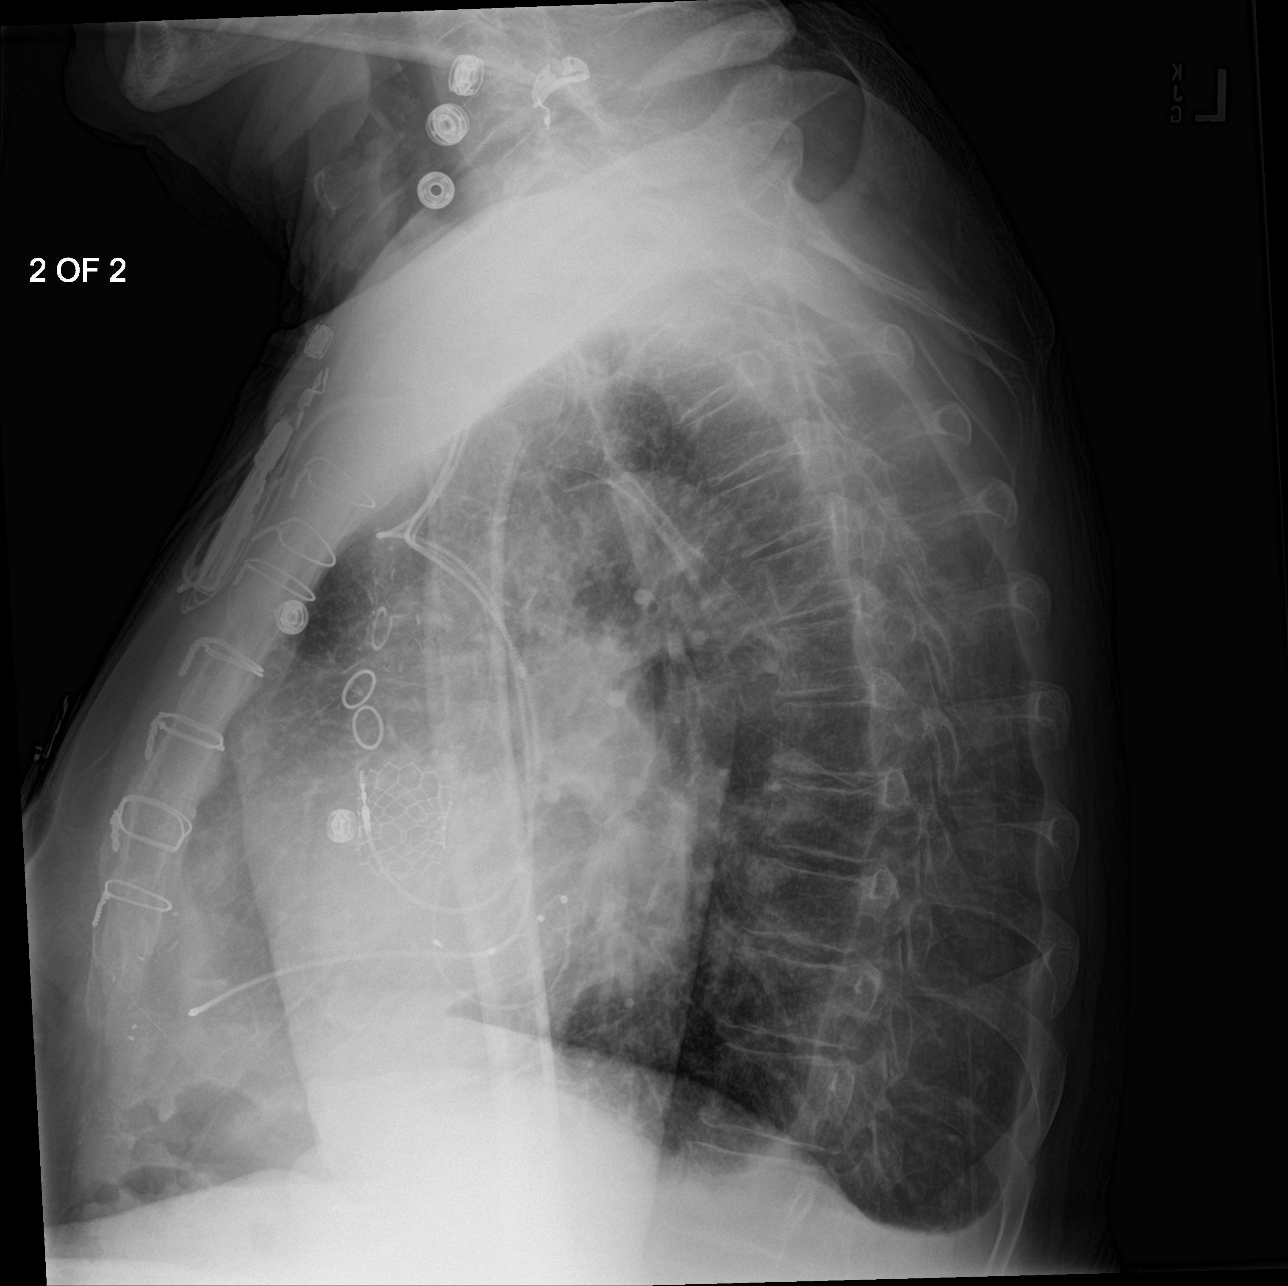

[3 of 3 positions shown; findings below may reference images not displayed]

FINDINGS: AICD in stable position. Prior CABG. Cardiomegaly with mild
bilateral interstitial prominence and tiny bilateral pleural
effusions. Findings suggest CHF. Low lung volumes with bibasilar
atelectasis. No pneumothorax. Surgical clips upper abdomen.
IMPRESSION: AICD in stable position. Prior CABG. Cardiomegaly with mild
bilateral interstitial prominence and tiny bilateral pleural
effusions suggesting CHF.

## 2020-05-28 ENCOUNTER — Other Ambulatory Visit: Payer: Self-pay | Admitting: *Deleted

## 2020-05-28 MED ORDER — PANTOPRAZOLE SODIUM 40 MG PO TBEC: 40.0000 mg | DELAYED_RELEASE_TABLET | Freq: Every day | ORAL | 1 refills | Status: DC

## 2020-05-28 MED ORDER — CARVEDILOL 6.25 MG PO TABS: 6.2500 mg | ORAL_TABLET | Freq: Two times a day (BID) | ORAL | 0 refills | Status: DC

## 2020-05-28 MED ORDER — ATORVASTATIN CALCIUM 80 MG PO TABS: 80.0000 mg | ORAL_TABLET | Freq: Every day | ORAL | 1 refills | Status: DC

## 2020-05-29 ENCOUNTER — Encounter: Payer: Self-pay | Admitting: Oncology

## 2020-05-29 NOTE — Progress Notes (Signed)
Hematology/Oncology Consult note [facility]  Telephone:([phone] [phone]  Patient Care Team: Antionette Char, MD as PCP - General (Family Medicine) Wellington Hampshire, MD as PCP - Cardiology (Cardiology) Wellington Hampshire, MD as Consulting Physician (Cardiology) Daneil Dolin, MD as Consulting Physician (Gastroenterology)   Name of the patient: Matthew [name]  [id number]  12-03-1936   Date of visit: 05/29/20  Diagnosis- 1.  Iron deficiency anemia 2.  Thrombocytopenia  Chief complaint/ Reason for visit-routine follow-up of iron deficiency anemia and thrombocytopenia  Heme/Onc history: patient is a 84 year old male with past medical history significant for severe aortic stenosis, mild thrombocytopenia among other medical problems. He was recently admitted to the hospital for acute symptomatic anemia from upper GI bleed when his hemoglobin dropped down from 10.6-7. Labs indicated severe iron deficiency. He underwent EGD and colonoscopy. Upper endoscopy showed several gastric AVMs which were treated with APC. 2 cm hiatal hernia was also noted. Colonoscopy showed a large flat polyp in the cecum that was not resected due to poor prep. Patient has seen Dr. Marius Ditch who plans to do a repeat colonoscopy in January 2020. Patient was also found to have low B12 levels of 128.  Patient received B12 shots as well as IV iron with normalization of his hemoglobin   Interval history-patient is currently doing well and denies any complaints at this time.  Denies any blood loss in his stool or urine  ECOG PS- 1 Pain scale- 0   Review of systems- Review of Systems  Constitutional: Positive for malaise/fatigue. Negative for chills, fever and weight loss.  HENT: Negative for congestion, ear discharge and nosebleeds.   Eyes: Negative for blurred vision.  Respiratory: Negative for cough, hemoptysis, sputum production, shortness of breath and wheezing.     Cardiovascular: Negative for chest pain, palpitations, orthopnea and claudication.  Gastrointestinal: Negative for abdominal pain, blood in stool, constipation, diarrhea, heartburn, melena, nausea and vomiting.  Genitourinary: Negative for dysuria, flank pain, frequency, hematuria and urgency.  Musculoskeletal: Negative for back pain, joint pain and myalgias.  Skin: Negative for rash.  Neurological: Negative for dizziness, tingling, focal weakness, seizures, weakness and headaches.  Endo/Heme/Allergies: Does not bruise/bleed easily.  Psychiatric/Behavioral: Negative for depression and suicidal ideas. The patient does not have insomnia.      Allergies  Allergen Reactions  . Ranexa [Ranolazine Er] Rash     Past Medical History:  Diagnosis Date  . Alcohol abuse    stopped drinking 42 years ago from today 2020  . Anemia   . AS (aortic stenosis)    a. Echo 6/10: EF 55% mild AS; b. echo 06/2015; EF 55-60%, GR1DD, moderate AS, Peak velocity (S): 346 cm/s. Mean gradientS): 28 mm Hg. Peak gradient (S): 48 mm Hg. Valve area (VTI): 1.18 cm2;   c. Echo 4/17 - mild LVH, EF 55-60%, no RWMA, Gr 1 DD, mod to severe AS (mean 28 mmHg, peak 46 mmHg), mild LAE  . CAD (coronary artery disease)    a. MI 1996 w/ CABG 1996; b.redo CABG 06/2003; c. Myoview 7/09: EF 54% inferobasal infarct, no ischemia. Myoview 6/10 EF 43% inf wall infarct. no ischemia; c. cath 8/16 s/p DES to VG-OM2. OTW 3VD w/ patent VG->RPDA, VG->OM3,  & LIMA->LAD.  EF 55-65%.  . Carotid bruit    2009 0-39% on dopplers bilatrally  . Chest pain   . GERD (gastroesophageal reflux disease)   . History of kidney stones   . HOH (hard of  hearing)   . HTN (hypertension)   . Hyperlipidemia   . Myocardial infarction ([place]) 1996  . Nephrolithiasis   . OSA (obstructive sleep apnea)   . PAD (peripheral artery disease) ([place]) 02/2014   Subtotal occlusion of right common iliac artery and 70% stenosis in the left common iliac artery. Status post  bilateral kissing stent placement. 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 Shortness of breath    with exertion  . Thrombocytopenia ([place])      Past Surgical History:  Procedure Laterality Date  . ABDOMINAL AORTAGRAM N/A 03/06/2014   Procedure: ABDOMINAL Maxcine Ham;  Surgeon: Wellington Hampshire, MD;  Location: [place] CATH LAB;  Service: Cardiovascular;  Laterality: N/A;  . BIV PACEMAKER INSERTION CRT-P N/A 07/23/2019   Procedure: BIV PACEMAKER INSERTION CRT-P;  Surgeon: Deboraha Sprang, MD;  Location: [place] CV LAB;  Service: Cardiovascular;  Laterality: N/A;  . CARDIAC CATHETERIZATION  02/2014   Severe three-vessel coronary artery disease with patent grafts.   Marland Kitchen CARDIAC CATHETERIZATION N/A 07/31/2015   Procedure: Right/Left Heart Cath and Coronary/Graft Angiography;  Surgeon: Wellington Hampshire, MD;  Location: [place] CV LAB;  Service: Cardiovascular;  Laterality: N/A;  . CARDIAC CATHETERIZATION N/A 07/31/2015   Procedure: Coronary Stent Intervention;  Surgeon: Wellington Hampshire, MD;  Location: [place] CV LAB;  Service: Cardiovascular;  Laterality: N/A;  . CARDIAC CATHETERIZATION N/A 05/26/2016   Procedure: Right/Left Heart Cath and Coronary Angiography;  Surgeon: Wellington Hampshire, MD;  Location: [place] CV LAB;  Service: Cardiovascular;  Laterality: N/A;  . COLONOSCOPY WITH PROPOFOL N/A 10/07/2018   Procedure: COLONOSCOPY WITH PROPOFOL;  Surgeon: Lin Landsman, MD;  Location: [facility] ENDOSCOPY;  Service: Gastroenterology;  Laterality: N/A;  . COLONOSCOPY WITH PROPOFOL N/A 01/08/2019   Procedure: COLONOSCOPY WITH PROPOFOL;  Surgeon: Lin Landsman, MD;  Location: [facility] ENDOSCOPY;  Service: Gastroenterology;  Laterality: N/A;  . CORONARY ARTERY BYPASS GRAFT  1991   at [facility]. Redone 2004-3 vessels 1st time and 4  second time  . CYSTO     2/3 times for kidney stones  . ENTEROSCOPY N/A 12/25/2018   Procedure: ENTEROSCOPY Single balloon;  Surgeon: Lin Landsman, MD;  Location: [place];  Service: Gastroenterology;  Laterality: N/A;  . ESOPHAGOGASTRODUODENOSCOPY (EGD) WITH PROPOFOL N/A 10/07/2018   Procedure: ESOPHAGOGASTRODUODENOSCOPY (EGD) WITH PROPOFOL;  Surgeon: Lin Landsman, MD;  Location: [facility] ENDOSCOPY;  Service: Gastroenterology;  Laterality: N/A;  . GIVENS CAPSULE STUDY N/A 12/22/2018   Procedure: GIVENS CAPSULE STUDY;  Surgeon: Lin Landsman, MD;  Location: [facility] ENDOSCOPY;  Service: Gastroenterology;  Laterality: N/A;  . INGUINAL HERNIA REPAIR  11/10/2011   Procedure: HERNIA REPAIR INGUINAL ADULT;  Surgeon: Donato Heinz;  Location: [facility];  Service: General;  Laterality: Right;  . LAPAROSCOPIC RIGHT COLECTOMY Right 02/09/2019   Procedure: LAPAROSCOPIC RIGHT COLECTOMY;  Surgeon: Jules Husbands, MD;  Location: [facility] ORS;  Service: General;  Laterality: Right;  . LEFT HEART CATH AND CORONARY ANGIOGRAPHY Left 05/15/2018   Procedure: LEFT HEART CATH AND CORONARY ANGIOGRAPHY;  Surgeon: Wellington Hampshire, MD;  Location: [place] CV LAB;  Service: Cardiovascular;  Laterality: Left;  . LEFT HEART CATHETERIZATION WITH CORONARY/GRAFT ANGIOGRAM N/A 03/06/2014   Procedure: LEFT HEART CATHETERIZATION WITH Beatrix Fetters;  Surgeon: Wellington Hampshire, MD;  Location: [place] CATH LAB;  Service: Cardiovascular;  Laterality:  N/A;  . STOMACH SURGERY     removal of gastric ulcers  . TEE WITHOUT CARDIOVERSION N/A 06/01/2016   Procedure: TRANSESOPHAGEAL ECHOCARDIOGRAM (TEE);  Surgeon: Sherren Mocha, MD;  Location: [place];  Service: Open Heart Surgery;  Laterality: N/A;  . TRANSCATHETER AORTIC VALVE REPLACEMENT, TRANSFEMORAL N/A 06/01/2016   Procedure: TRANSCATHETER AORTIC VALVE REPLACEMENT, TRANSFEMORAL;  Surgeon: Sherren Mocha, MD;  Location: [place];  Service: Open Heart Surgery;   Laterality: N/A;    Social History   Socioeconomic History  . Marital status: Married    Spouse name: Not on file  . Number of children: 2  . Years of education: Not on file  . Highest education level: Not on file  Occupational History  . Occupation: Retired  Tobacco Use  . Smoking status: Former Smoker    Packs/day: 1.00    Years: 50.00    Pack years: 50.00    Types: Cigarettes    Quit date: 01/24/1990    Years since quitting: 30.3  . Smokeless tobacco: Never Used  Vaping Use  . Vaping Use: Never used  Substance and Sexual Activity  . Alcohol use: No    Alcohol/week: 0.0 standard drinks    Comment: Alcoholic in past  . Drug use: No  . Sexual activity: Never  Other Topics Concern  . Not on file  Social History Narrative   Married an lives with wife in [place]. Retired form [employer].    Social Determinants of Health   Financial Resource Strain:   . Difficulty of Paying Living Expenses:   Food Insecurity:   . Worried About Charity fundraiser in the Last Year:   . Arboriculturist in the Last Year:   Transportation Needs:   . Film/video editor (Medical):   Marland Kitchen Lack of Transportation (Non-Medical):   Physical Activity:   . Days of Exercise per Week:   . Minutes of Exercise per Session:   Stress:   . Feeling of Stress :   Social Connections:   . Frequency of Communication with Friends and Family:   . Frequency of Social Gatherings with Friends and Family:   . Attends Religious Services:   . Active Member of Clubs or Organizations:   . Attends Archivist Meetings:   Marland Kitchen Marital Status:   Intimate Partner Violence:   . Fear of Current or Ex-Partner:   . Emotionally Abused:   Marland Kitchen Physically Abused:   . Sexually Abused:     Family History  Problem Relation Age of Onset  . Heart disease Father   . Heart disease Mother   . Colon cancer Neg Hx      Current Outpatient Medications:  .  acetaminophen (TYLENOL) 325 MG tablet, Take 650 mg  by mouth every 6 (six) hours as needed for mild pain or headache. , Disp: , Rfl:  .  aspirin EC 81 MG tablet, Take 1 tablet (81 mg total) by mouth daily., Disp: 90 tablet, Rfl: 3 .  Cyanocobalamin (VITAMIN B 12 PO), Take 1 tablet by mouth daily., Disp: , Rfl:  .  Ferrous Sulfate (IRON PO), Take 1 tablet by mouth daily., Disp: , Rfl:  .  isosorbide mononitrate (IMDUR) 60 MG 24 hr tablet, Take 1 tablet (60 mg total) by mouth daily., Disp: 90 tablet, Rfl: 3 .  Multiple Vitamins-Minerals (PRESERVISION AREDS PO), Take 1 tablet by mouth daily. , Disp: , Rfl:  .  Omega-3 Fatty Acids (FISH OIL PO), Take 1 capsule by  mouth daily. , Disp: , Rfl:  .  sacubitril-valsartan (ENTRESTO) 49-51 MG, Take 1 tablet by mouth 2 (two) times daily., Disp: 180 tablet, Rfl: 1 .  tamsulosin (FLOMAX) 0.4 MG CAPS capsule, Take 1 capsule (0.4 mg total) by mouth daily. Future refill request will need to be sent to the pcp, Disp: 90 capsule, Rfl: 1 .  tetrahydrozoline (VISINE) 0.05 % ophthalmic solution, Place 2 drops into both eyes 4 (four) times daily as needed (for dry eyes)., Disp: , Rfl:  .  TURMERIC PO, Take 1 capsule by mouth at bedtime. , Disp: , Rfl:  .  atorvastatin (LIPITOR) 80 MG tablet, Take 1 tablet (80 mg total) by mouth daily at 6 PM., Disp: 90 tablet, Rfl: 1 .  carvedilol (COREG) 6.25 MG tablet, Take 1 tablet (6.25 mg total) by mouth 2 (two) times daily., Disp: 180 tablet, Rfl: 0 .  pantoprazole (PROTONIX) 40 MG tablet, Take 1 tablet (40 mg total) by mouth daily., Disp: 90 tablet, Rfl: 1 No current facility-administered medications for this visit.  Facility-Administered Medications Ordered in Other Visits:  .  cyanocobalamin ((VITAMIN B-12)) injection 1,000 mcg, 1,000 mcg, Intramuscular, Weekly, Sindy Guadeloupe, MD, 1,000 mcg at 02/25/20 1447  Physical exam:  Vitals:   05/26/20 1409  BP: (!) 147/85  Pulse: 70  Resp: 18  Temp: 98.4 F (36.9 C)  SpO2: 98%  Weight: 200 lb 6.4 oz (90.9 kg)   Physical  Exam HENT:     Head: Normocephalic and atraumatic.  Eyes:     Pupils: Pupils are equal, round, and reactive to light.  Cardiovascular:     Rate and Rhythm: Normal rate and regular rhythm.     Heart sounds: Normal heart sounds.  Pulmonary:     Effort: Pulmonary effort is normal.     Breath sounds: Normal breath sounds.  Abdominal:     General: Bowel sounds are normal.     Palpations: Abdomen is soft.  Musculoskeletal:     Cervical back: Normal range of motion.  Skin:    General: Skin is warm and dry.  Neurological:     Mental Status: He is alert and oriented to person, place, and time.      CMP Latest Ref Rng & Units 07/12/2019  Glucose 65 - 99 mg/dL 96  BUN 8 - 27 mg/dL 14  Creatinine 0.76 - 1.27 mg/dL 1.09  Sodium 134 - 144 mmol/L 141  Potassium 3.5 - 5.2 mmol/L 3.9  Chloride 96 - 106 mmol/L 104  CO2 20 - 29 mmol/L 23  Calcium 8.6 - 10.2 mg/dL 9.3  Total Protein 6.5 - 8.1 g/dL -  Total Bilirubin 0.3 - 1.2 mg/dL -  Alkaline Phos 38 - 126 U/L -  AST 15 - 41 U/L -  ALT 0 - 44 U/L -   CBC Latest Ref Rng & Units 05/26/2020  WBC 4.0 - 10.5 K/uL 6.3  Hemoglobin 13.0 - 17.0 g/dL 13.6  Hematocrit 39 - 52 % 39.4  Platelets 150 - 400 K/uL 98(L)      Assessment and plan- Patient is a 84 y.o. male with history of iron deficiency anemia and thrombocytopenia here for routine follow-up  1.  Iron deficiency anemia: Patient is currently not anemic and his iron studies are within normal limits.  He does not require any IV iron at this time  2.  Thrombocytopenia: This is been a chronic issue and his platelet counts have been fluctuating between 90s to 120s.  Overall counts have  been stable over the last 4 years.  Continue to monitor  Labs in 4 months and 8 months and I will see him back in 8 months   Visit Diagnosis 1. Iron deficiency anemia, unspecified iron deficiency anemia type   2. B12 deficiency   3. Thrombocytopenia ([place])      Dr. Randa Evens, MD, MPH [facility] at [facility] [phone] 05/29/2020 10:43 AM

## 2020-07-13 NOTE — Progress Notes (Signed)
PROVIDER NOTE: Information contained herein reflects review and annotations entered in association with encounter. Interpretation of such information and data should be left to medically-trained personnel. Information provided to patient can be located elsewhere in the medical record under "Patient Instructions". Document created using STT-dictation technology, any transcriptional errors that may result from process are unintentional.    Patient: Matthew [name]  Service Category: E/M  Provider: Gaspar Cola, MD  DOB: 05/13/1936  DOS: 07/14/2020  Specialty: Interventional Pain Management  MRN: [id number]  Setting: Ambulatory outpatient  PCP: Antionette Char, MD  Type: Established Patient    Referring Provider: Antionette Char, MD  Location: Office  Delivery: Face-to-face     HPI  Reason for encounter: Matthew [name], a 84 y.o. year old male, is here today for evaluation and management of his Chronic pain syndrome [G89.4]. Matthew [name] primary complain today is Back Pain Last encounter: Practice (02/06/2020). My last encounter with him was on 01/24/2020. Pertinent problems: Matthew [name] has Chronic low back pain (Primary Area of Pain) (Bilateral) (L>R); Chronic lower extremity pain (Bilateral) (L>R); Chronic pain syndrome; Chronic sacroiliac joint pain (Bilateral) (L>R); Chronic hip pain; Neurogenic pain; Lumbar facet syndrome (Bilateral) (L>R); DDD (degenerative disc disease), lumbar; Lumbar facet osteoarthritis; Lumbar spondylosis with radiculopathy; Chronic lower extremity radicular pain (S1 dermatome) (Bilateral) (L>R); Lumbar spinal stenosis; Abnormal MRI, lumbar spine; Chronic lumbar radiculopathy; Spondylosis without myelopathy or radiculopathy, lumbosacral region; and Other spondylosis, sacral and sacrococcygeal region on their pertinent problem list. Pain Assessment: Severity of Chronic pain is reported as a 7 /10. Location: Back Lower, Right/pain radiaties down right leg to his knee. Onset: More than  a month ago. Quality: Pressure. Timing: Intermittent. Modifying factor(s): sit down. Vitals:  height is 5' 9"  (1.753 m) and weight is 200 lb (90.7 kg). His temperature is 97.3 F (36.3 C) (abnormal). His blood pressure is 135/69 and his pulse is 68. His oxygen saturation is 98%.   The patient returns into the clinic today with her daughter who serves as a Optometrist since he is hard of hearing.  According to her his pain has returned and he comes in to have the last procedure repeated.  Reviewing the chart reveals that the patient had a bilateral lumbar facet and bilateral sacroiliac joint block done around February of this year which has provided him with good relief of the pain until recently when he started coming back.  We will proceed with a repeat injection, as soon as we can get him on the schedule.  He is on Plavix and he needs to stay off of it for 7 days prior to the procedure.  Pharmacotherapy Assessment   Analgesic: Tramadol 50 mg 1 tablet by mouth every 8 hours (150 mg/day of tramadol) (15 MME/day) MME/day: 15 mg/day.   Monitoring: [place] PMP: PDMP reviewed during this encounter.       Pharmacotherapy: No side-effects or adverse reactions reported. Compliance: No problems identified. Effectiveness: Clinically acceptable.  No notes on file  UDS:  Summary  Date Value Ref Range Status  08/31/2017 FINAL  Final    Comment:    ==================================================================== [facility] COMP DRUG ANALYSIS,UR ==================================================================== Test                             Result       Flag       Units Drug Present and Declared for Prescription Verification   Acetaminophen  PRESENT      EXPECTED Drug Present not Declared for Prescription Verification   Tramadol                       >5102        UNEXPECTED ng/mg creat   O-Desmethyltramadol            5066         UNEXPECTED ng/mg creat   N-Desmethyltramadol             1382         UNEXPECTED ng/mg creat    Source of tramadol is a prescription medication.    O-desmethyltramadol and N-desmethyltramadol are expected    metabolites of tramadol. Drug Absent but Declared for Prescription Verification   Salicylate                     Not Detected UNEXPECTED    Aspirin, as indicated in the declared medication list, is not    always detected even when used as directed.   Metoprolol                     Not Detected UNEXPECTED ==================================================================== Test                      Result    Flag   Units      Ref Range   Creatinine              98               mg/dL      >=20 ==================================================================== Declared Medications:  The flagging and interpretation on this report are based on the  following declared medications.  Unexpected results may arise from  inaccuracies in the declared medications.  **Note: The testing scope of this panel includes these medications:  Metoprolol  **Note: The testing scope of this panel does not include small to  moderate amounts of these reported medications:  Acetaminophen  Aspirin  **Note: The testing scope of this panel does not include following  reported medications:  Atorvastatin  Clopidogrel  Hydrochlorothiazide (HCTZ)  Isosorbide Mononitrate  Lisinopril  Multivitamin  Nitroglycerin  Pantoprazole  Tamsulosin ==================================================================== For clinical consultation, please call [phone]. ====================================================================      ROS  Constitutional: Denies any fever or chills Gastrointestinal: No reported hemesis, hematochezia, vomiting, or acute GI distress Musculoskeletal: Denies any acute onset joint swelling, redness, loss of ROM, or weakness Neurological: No reported episodes of acute onset apraxia, aphasia, dysarthria, agnosia, amnesia,  paralysis, loss of coordination, or loss of consciousness  Medication Review  Cyanocobalamin, Ferrous Sulfate, Multiple Vitamins-Minerals, Omega-3 Fatty Acids, Turmeric, acetaminophen, aspirin EC, atorvastatin, carvedilol, isosorbide mononitrate, pantoprazole, sacubitril-valsartan, tamsulosin, and tetrahydrozoline  History Review  Allergy: Matthew [name] is allergic to ranexa [ranolazine er]. Drug: Matthew [name]  reports no history of drug use. Alcohol:  reports no history of alcohol use. Tobacco:  reports that he quit smoking about 30 years ago. His smoking use included cigarettes. He has a 50.00 pack-year smoking history. He has never used smokeless tobacco. Social: Matthew [name]  reports that he quit smoking about 30 years ago. His smoking use included cigarettes. He has a 50.00 pack-year smoking history. He has never used smokeless tobacco. He reports that he does not drink alcohol and does not use drugs. Medical:  has a past medical history of Alcohol abuse, Anemia, AS (aortic stenosis), CAD (coronary artery  disease), Carotid bruit, Chest pain, GERD (gastroesophageal reflux disease), History of kidney stones, HOH (hard of hearing), HTN (hypertension), Hyperlipidemia, Myocardial infarction ([place]) (1996), Nephrolithiasis, OSA (obstructive sleep apnea), PAD (peripheral artery disease) ([place]) (02/2014), Shortness of breath, and Thrombocytopenia ([place]). Surgical: Matthew [name]  has a past surgical history that includes Stomach surgery; Coronary artery bypass graft (1991); Inguinal hernia repair (11/10/2011); left heart catheterization with coronary/graft angiogram (N/A, 03/06/2014); abdominal aortagram (N/A, 03/06/2014); Cardiac catheterization (02/2014); Cardiac catheterization (N/A, 07/31/2015); Cardiac catheterization (N/A, 07/31/2015); Cardiac catheterization (N/A, 05/26/2016); Cysto; Transcatheter aortic valve replacement, transfemoral (N/A, 06/01/2016); TEE without cardioversion (N/A, 06/01/2016); LEFT HEART CATH AND CORONARY  ANGIOGRAPHY (Left, 05/15/2018); Esophagogastroduodenoscopy (egd) with propofol (N/A, 10/07/2018); Colonoscopy with propofol (N/A, 10/07/2018); Givens capsule study (N/A, 12/22/2018); enteroscopy (N/A, 12/25/2018); Colonoscopy with propofol (N/A, 01/08/2019); Laparoscopic right colectomy (Right, 02/09/2019); and BIV PACEMAKER INSERTION CRT-P (N/A, 07/23/2019). Family: family history includes Heart disease in his father and mother.  Laboratory Chemistry Profile   Renal Lab Results  Component Value Date   BUN 14 07/12/2019   CREATININE 1.09 07/12/2019   BCR 13 07/12/2019   GFRAA 74 07/12/2019   GFRNONAA 64 07/12/2019     Hepatic Lab Results  Component Value Date   AST 17 01/17/2019   ALT 17 01/17/2019   ALBUMIN 4.5 01/17/2019   ALKPHOS 64 01/17/2019     Electrolytes Lab Results  Component Value Date   NA 141 07/12/2019   K 3.9 07/12/2019   CL 104 07/12/2019   CALCIUM 9.3 07/12/2019   MG 2.0 02/10/2019   PHOS 1.7 (L) 02/10/2019     Bone Lab Results  Component Value Date   25OHVITD1 31 08/31/2017   25OHVITD2 <1.0 08/31/2017   25OHVITD3 31 08/31/2017     Inflammation (CRP: Acute Phase) (ESR: Chronic Phase) Lab Results  Component Value Date   CRP 0.4 08/31/2017   ESRSEDRATE 3 08/31/2017       Note: Above Lab results reviewed.  Recent Imaging Review  CUP PACEART REMOTE DEVICE CHECK Scheduled remote reviewed. Normal device function.  3 AMS episodes, longest 8 secs.   Next remote 91 days- JBox, RN/CVRS Note: Reviewed        Physical Exam  General appearance: Well nourished, well developed, and well hydrated. In no apparent acute distress Mental status: Alert, oriented x 3 (person, place, & time)       Respiratory: No evidence of acute respiratory distress Eyes: PERLA Vitals: BP 135/69   Pulse 68   Temp (!) 97.3 F (36.3 C)   Ht 5' 9"  (1.753 m)   Wt 200 lb (90.7 kg)   SpO2 98%   BMI 29.53 kg/m  BMI: Estimated body mass index is 29.53 kg/m as calculated from  the following:   Height as of this encounter: 5' 9"  (1.753 m).   Weight as of this encounter: 200 lb (90.7 kg). Ideal: Ideal body weight: 70.7 kg (155 lb 13.8 oz) Adjusted ideal body weight: 78.7 kg (173 lb 8.3 oz)  Assessment   Status Diagnosis  Controlled Controlled Controlled 1. Chronic pain syndrome   2. Chronic low back pain (Primary Area of Pain) (Bilateral) (L>R)   3. Lumbar facet syndrome (Bilateral) (L>R)   4. Chronic sacroiliac joint pain (Bilateral) (L>R)      Updated Problems: No problems updated.  Plan of Care  Problem-specific:  No problem-specific Assessment & Plan notes found for this encounter.  Matthew [name] has a current medication list which includes the following long-term medication(s): atorvastatin, carvedilol,  ferrous sulfate, isosorbide mononitrate, and pantoprazole.  Pharmacotherapy (Medications Ordered): No orders of the defined types were placed in this encounter.  Orders:  Orders Placed This Encounter  Procedures  . LUMBAR FACET(MEDIAL BRANCH NERVE BLOCK) MBNB    Standing Status:   Future    Standing Expiration Date:   08/14/2020    Scheduling Instructions:     Procedure: Lumbar facet block (AKA.: Lumbosacral medial branch nerve block)     Side: Bilateral     Level: L3-4, L4-5, & L5-S1 Facets (L2, L3, L4, L5, & S1 Medial Branch Nerves)     Sedation: Patient's choice.     Timeframe: ASAA    Order Specific Question:   Where will this procedure be performed?    Answer:   [facility]  . SACROILIAC JOINT INJECTION    Standing Status:   Future    Standing Expiration Date:   08/14/2020    Scheduling Instructions:     Side: Bilateral     Sedation: Patient's choice.     Timeframe: ASAP    Order Specific Question:   Where will this procedure be performed?    Answer:   [facility]   Follow-up plan:   Return for Procedure (w/ sedation): (B) L-FCT + (B) SI #3, (Blood Thinner Protocol).      Interventional management  options: Planned, scheduled, and/or pending:  NOTE:Stop Plavix for7 days forprocedures.    Considering:   Possible bilateral lumbar facet RFA #1 Possible bilateral SI joint RFA #1 Diagnostic left L2 TFESI Diagnostic bilateral L3 TFESI Diagnostic bilateral hip injections   Palliative PRN treatment(s):   Diagnostic bilateral lumbar facet block #3  Diagnostic bilateral SI joint block #3  Therapeutic/palliative left L4-5 LESI #3    Recent Visits No visits were found meeting these conditions. Showing recent visits within past 90 days and meeting all other requirements Today's Visits Date Type Provider Dept  07/14/20 Office Visit Milinda Pointer, MD [facility]  Showing today's visits and meeting all other requirements Future Appointments Date Type Provider Dept  07/24/20 Appointment Milinda Pointer, MD [facility]  Showing future appointments within next 90 days and meeting all other requirements  I discussed the assessment and treatment plan with the patient. The patient was provided an opportunity to ask questions and all were answered. The patient agreed with the plan and demonstrated an understanding of the instructions.  Patient advised to call back or seek an in-person evaluation if the symptoms or condition worsens.  Duration of encounter: 30 minutes.  Note by: Gaspar Cola, MD Date: 07/14/2020; Time: 6:36 PM

## 2020-07-14 ENCOUNTER — Ambulatory Visit: Payer: Medicare Other | Attending: Pain Medicine | Admitting: Pain Medicine

## 2020-07-14 ENCOUNTER — Encounter: Payer: Self-pay | Admitting: Pain Medicine

## 2020-07-14 ENCOUNTER — Other Ambulatory Visit: Payer: Self-pay

## 2020-07-14 VITALS — BP 135/69 | HR 68 | Temp 97.3°F | Ht 69.0 in | Wt 200.0 lb

## 2020-07-14 DIAGNOSIS — M47816 Spondylosis without myelopathy or radiculopathy, lumbar region: Secondary | ICD-10-CM | POA: Diagnosis not present

## 2020-07-14 DIAGNOSIS — G8929 Other chronic pain: Secondary | ICD-10-CM | POA: Diagnosis present

## 2020-07-14 DIAGNOSIS — G894 Chronic pain syndrome: Secondary | ICD-10-CM

## 2020-07-14 DIAGNOSIS — M533 Sacrococcygeal disorders, not elsewhere classified: Secondary | ICD-10-CM | POA: Insufficient documentation

## 2020-07-14 DIAGNOSIS — M5442 Lumbago with sciatica, left side: Secondary | ICD-10-CM | POA: Diagnosis not present

## 2020-07-14 DIAGNOSIS — M5441 Lumbago with sciatica, right side: Secondary | ICD-10-CM | POA: Insufficient documentation

## 2020-07-14 NOTE — Patient Instructions (Addendum)
____________________________________________________________________________________________  Preparing for Procedure with Sedation  Procedure appointments are limited to planned procedures: . No Prescription Refills. . No disability issues will be discussed. . No medication changes will be discussed.  Instructions: . Oral Intake: Do not eat or drink anything for at least 8 hours prior to your procedure. (Exception: Blood Pressure Medication. See below.) . Transportation: Unless otherwise stated by your physician, you may drive yourself after the procedure. . Blood Pressure Medicine: Do not forget to take your blood pressure medicine with a sip of water the morning of the procedure. If your Diastolic (lower reading)is above 100 mmHg, elective cases will be cancelled/rescheduled. . Blood thinners: These will need to be stopped for procedures. Notify our staff if you are taking any blood thinners. Depending on which one you take, there will be specific instructions on how and when to stop it. . Diabetics on insulin: Notify the staff so that you can be scheduled 1st case in the morning. If your diabetes requires high dose insulin, take only  of your normal insulin dose the morning of the procedure and notify the staff that you have done so. . Preventing infections: Shower with an antibacterial soap the morning of your procedure. . Build-up your immune system: Take 1000 mg of Vitamin C with every meal (3 times a day) the day prior to your procedure. . Antibiotics: Inform the staff if you have a condition or reason that requires you to take antibiotics before dental procedures. . Pregnancy: If you are pregnant, call and cancel the procedure. . Sickness: If you have a cold, fever, or any active infections, call and cancel the procedure. . Arrival: You must be in the facility at least 30 minutes prior to your scheduled procedure. . Children: Do not bring children with you. . Dress appropriately:  Bring dark clothing that you would not mind if they get stained. . Valuables: Do not bring any jewelry or valuables.  Reasons to call and reschedule or cancel your procedure: (Following these recommendations will minimize the risk of a serious complication.) . Surgeries: Avoid having procedures within 2 weeks of any surgery. (Avoid for 2 weeks before or after any surgery). . Flu Shots: Avoid having procedures within 2 weeks of a flu shots or . (Avoid for 2 weeks before or after immunizations). . Barium: Avoid having a procedure within 7-10 days after having had a radiological study involving the use of radiological contrast. (Myelograms, Barium swallow or enema study). . Heart attacks: Avoid any elective procedures or surgeries for the initial 6 months after a "Myocardial Infarction" (Heart Attack). . Blood thinners: It is imperative that you stop these medications before procedures. Let us know if you if you take any blood thinner.  . Infection: Avoid procedures during or within two weeks of an infection (including chest colds or gastrointestinal problems). Symptoms associated with infections include: Localized redness, fever, chills, night sweats or profuse sweating, burning sensation when voiding, cough, congestion, stuffiness, runny nose, sore throat, diarrhea, nausea, vomiting, cold or Flu symptoms, recent or current infections. It is specially important if the infection is over the area that we intend to treat. . Heart and lung problems: Symptoms that may suggest an active cardiopulmonary problem include: cough, chest pain, breathing difficulties or shortness of breath, dizziness, ankle swelling, uncontrolled high or unusually low blood pressure, and/or palpitations. If you are experiencing any of these symptoms, cancel your procedure and contact your primary care physician for an evaluation.  Remember:  Regular Business hours are:    Monday to Thursday 8:00 AM to 4:00 PM  Provider's  Schedule: Geanie Pacifico, MD:  Procedure days: Tuesday and Thursday 7:30 AM to 4:00 PM  Bilal Lateef, MD:  Procedure days: Monday and Wednesday 7:30 AM to 4:00 PM ____________________________________________________________________________________________   ____________________________________________________________________________________________  Blood Thinners  IMPORTANT NOTICE:  If you take any of these, make sure to notify the nursing staff.  Failure to do so may result in injury.  Recommended time intervals to stop and restart blood-thinners, before & after invasive procedures  Generic Name Brand Name Stop Time. Must be stopped at least this long before procedures. After procedures, wait at least this long before re-starting.  Abciximab Reopro 15 days 2 hrs  Alteplase Activase 10 days 10 days  Anagrelide Agrylin    Apixaban Eliquis 3 days 6 hrs  Cilostazol Pletal 3 days 5 hrs  Clopidogrel Plavix 7-10 days 2 hrs  Dabigatran Pradaxa 5 days 6 hrs  Dalteparin Fragmin 24 hours 4 hrs  Dipyridamole Aggrenox 11days 2 hrs  Edoxaban Lixiana; Savaysa 3 days 2 hrs  Enoxaparin  Lovenox 24 hours 4 hrs  Eptifibatide Integrillin 8 hours 2 hrs  Fondaparinux  Arixtra 72 hours 12 hrs  Prasugrel Effient 7-10 days 6 hrs  Reteplase Retavase 10 days 10 days  Rivaroxaban Xarelto 3 days 6 hrs  Ticagrelor Brilinta 5-7 days 6 hrs  Ticlopidine Ticlid 10-14 days 2 hrs  Tinzaparin Innohep 24 hours 4 hrs  Tirofiban Aggrastat 8 hours 2 hrs  Warfarin Coumadin 5 days 2 hrs   Other medications with blood-thinning effects  Product indications Generic (Brand) names Note  Cholesterol Lipitor Stop 4 days before procedure  Blood thinner (injectable) Heparin (LMW or LMWH Heparin) Stop 24 hours before procedure  Cancer Ibrutinib (Imbruvica) Stop 7 days before procedure  Malaria/Rheumatoid Hydroxychloroquine (Plaquenil) Stop 11 days before procedure  Thrombolytics  10 days before or after procedures    Over-the-counter (OTC) Products with blood-thinning effects  Product Common names Stop Time  Aspirin > 325 mg Goody Powders, Excedrin, etc. 11 days  Aspirin ? 81 mg  7 days  Fish oil  4 days  Garlic supplements  7 days  Ginkgo biloba  36 hours  Ginseng  24 hours  NSAIDs Ibuprofen, Naprosyn, etc. 3 days  Vitamin E  4 days   ____________________________________________________________________________________________  ____________________________________________________________________________________________  General Risks and Possible Complications  Patient Responsibilities: It is important that you read this as it is part of your informed consent. It is our duty to inform you of the risks and possible complications associated with treatments offered to you. It is your responsibility as a patient to read this and to ask questions about anything that is not clear or that you believe was not covered in this document.  Patient's Rights: You have the right to refuse treatment. You also have the right to change your mind, even after initially having agreed to have the treatment done. However, under this last option, if you wait until the last second to change your mind, you may be charged for the materials used up to that point.  Introduction: Medicine is not an exact science. Everything in Medicine, including the lack of treatment(s), carries the potential for danger, harm, or loss (which is by definition: Risk). In Medicine, a complication is a secondary problem, condition, or disease that can aggravate an already existing one. All treatments carry the risk of possible complications. The fact that a side effects or complications occurs, does not imply that the treatment was conducted   incorrectly. It must be clearly understood that these can happen even when everything is done following the highest safety standards.  No treatment: You can choose not to proceed with the proposed treatment  alternative. The "PRO(s)" would include: avoiding the risk of complications associated with the therapy. The "CON(s)" would include: not getting any of the treatment benefits. These benefits fall under one of three categories: diagnostic; therapeutic; and/or palliative. Diagnostic benefits include: getting information which can ultimately lead to improvement of the disease or symptom(s). Therapeutic benefits are those associated with the successful treatment of the disease. Finally, palliative benefits are those related to the decrease of the primary symptoms, without necessarily curing the condition (example: decreasing the pain from a flare-up of a chronic condition, such as incurable terminal cancer).  General Risks and Complications: These are associated to most interventional treatments. They can occur alone, or in combination. They fall under one of the following six (6) categories: no benefit or worsening of symptoms; bleeding; infection; nerve damage; allergic reactions; and/or death. 1. No benefits or worsening of symptoms: In Medicine there are no guarantees, only probabilities. No healthcare provider can ever guarantee that a medical treatment will work, they can only state the probability that it may. Furthermore, there is always the possibility that the condition may worsen, either directly, or indirectly, as a consequence of the treatment. 2. Bleeding: This is more common if the patient is taking a blood thinner, either prescription or over the counter (example: Goody Powders, Fish oil, Aspirin, Garlic, etc.), or if suffering a condition associated with impaired coagulation (example: Hemophilia, cirrhosis of the liver, low platelet counts, etc.). However, even if you do not have one on these, it can still happen. If you have any of these conditions, or take one of these drugs, make sure to notify your treating physician. 3. Infection: This is more common in patients with a compromised immune  system, either due to disease (example: diabetes, cancer, human immunodeficiency virus [HIV], etc.), or due to medications or treatments (example: therapies used to treat cancer and rheumatological diseases). However, even if you do not have one on these, it can still happen. If you have any of these conditions, or take one of these drugs, make sure to notify your treating physician. 4. Nerve Damage: This is more common when the treatment is an invasive one, but it can also happen with the use of medications, such as those used in the treatment of cancer. The damage can occur to small secondary nerves, or to large primary ones, such as those in the spinal cord and brain. This damage may be temporary or permanent and it may lead to impairments that can range from temporary numbness to permanent paralysis and/or brain death. 5. Allergic Reactions: Any time a substance or material comes in contact with our body, there is the possibility of an allergic reaction. These can range from a mild skin rash (contact dermatitis) to a severe systemic reaction (anaphylactic reaction), which can result in death. 6. Death: In general, any medical intervention can result in death, most of the time due to an unforeseen complication. ____________________________________________________________________________________________   

## 2020-07-24 ENCOUNTER — Ambulatory Visit
Admission: RE | Admit: 2020-07-24 | Discharge: 2020-07-24 | Disposition: A | Discharge status: Home / Self Care | Payer: Medicare Other | Source: Ambulatory Visit | Attending: Pain Medicine | Admitting: Pain Medicine

## 2020-07-24 ENCOUNTER — Other Ambulatory Visit: Payer: Self-pay

## 2020-07-24 ENCOUNTER — Ambulatory Visit: Payer: Medicare Other | Attending: Pain Medicine | Admitting: Pain Medicine

## 2020-07-24 ENCOUNTER — Other Ambulatory Visit: Payer: Self-pay | Admitting: Pain Medicine

## 2020-07-24 ENCOUNTER — Encounter: Payer: Self-pay | Admitting: Pain Medicine

## 2020-07-24 VITALS — BP 153/78 | HR 68 | Temp 98.0°F | Resp 16 | Ht 69.0 in | Wt 200.0 lb

## 2020-07-24 DIAGNOSIS — M545 Low back pain: Secondary | ICD-10-CM | POA: Insufficient documentation

## 2020-07-24 DIAGNOSIS — M47817 Spondylosis without myelopathy or radiculopathy, lumbosacral region: Secondary | ICD-10-CM

## 2020-07-24 DIAGNOSIS — M47898 Other spondylosis, sacral and sacrococcygeal region: Secondary | ICD-10-CM

## 2020-07-24 DIAGNOSIS — Z7901 Long term (current) use of anticoagulants: Secondary | ICD-10-CM

## 2020-07-24 DIAGNOSIS — M47896 Other spondylosis, lumbar region: Secondary | ICD-10-CM | POA: Insufficient documentation

## 2020-07-24 DIAGNOSIS — M47818 Spondylosis without myelopathy or radiculopathy, sacral and sacrococcygeal region: Secondary | ICD-10-CM | POA: Insufficient documentation

## 2020-07-24 DIAGNOSIS — Z954 Presence of other heart-valve replacement: Secondary | ICD-10-CM | POA: Diagnosis not present

## 2020-07-24 DIAGNOSIS — M5441 Lumbago with sciatica, right side: Secondary | ICD-10-CM

## 2020-07-24 DIAGNOSIS — M47816 Spondylosis without myelopathy or radiculopathy, lumbar region: Secondary | ICD-10-CM | POA: Diagnosis not present

## 2020-07-24 DIAGNOSIS — Z7982 Long term (current) use of aspirin: Secondary | ICD-10-CM | POA: Diagnosis not present

## 2020-07-24 DIAGNOSIS — M533 Sacrococcygeal disorders, not elsewhere classified: Secondary | ICD-10-CM | POA: Insufficient documentation

## 2020-07-24 DIAGNOSIS — R52 Pain, unspecified: Secondary | ICD-10-CM

## 2020-07-24 DIAGNOSIS — Z952 Presence of prosthetic heart valve: Secondary | ICD-10-CM | POA: Diagnosis not present

## 2020-07-24 DIAGNOSIS — G8929 Other chronic pain: Secondary | ICD-10-CM | POA: Diagnosis not present

## 2020-07-24 DIAGNOSIS — Z888 Allergy status to other drugs, medicaments and biological substances status: Secondary | ICD-10-CM | POA: Diagnosis not present

## 2020-07-24 DIAGNOSIS — Z951 Presence of aortocoronary bypass graft: Secondary | ICD-10-CM | POA: Diagnosis not present

## 2020-07-24 DIAGNOSIS — Z7902 Long term (current) use of antithrombotics/antiplatelets: Secondary | ICD-10-CM | POA: Diagnosis not present

## 2020-07-24 DIAGNOSIS — Z79899 Other long term (current) drug therapy: Secondary | ICD-10-CM | POA: Diagnosis not present

## 2020-07-24 MED ORDER — ROPIVACAINE HCL 2 MG/ML IJ SOLN
INTRAMUSCULAR | Status: AC
  Filled 2020-07-24: qty 30

## 2020-07-24 MED ORDER — DEXAMETHASONE SODIUM PHOSPHATE 10 MG/ML IJ SOLN
INTRAMUSCULAR | Status: AC
  Filled 2020-07-24: qty 1

## 2020-07-24 MED ORDER — METHYLPREDNISOLONE ACETATE 80 MG/ML IJ SUSP
INTRAMUSCULAR | Status: AC
  Filled 2020-07-24: qty 1

## 2020-07-24 MED ORDER — FENTANYL CITRATE (PF) 100 MCG/2ML IJ SOLN
INTRAMUSCULAR | Status: AC
  Filled 2020-07-24: qty 2

## 2020-07-24 MED ORDER — LIDOCAINE HCL 2 % IJ SOLN
INTRAMUSCULAR | Status: AC
  Filled 2020-07-24: qty 20

## 2020-07-24 MED ORDER — MIDAZOLAM HCL 5 MG/5ML IJ SOLN
INTRAMUSCULAR | Status: AC
  Filled 2020-07-24: qty 5

## 2020-07-24 NOTE — Progress Notes (Signed)
PROVIDER NOTE: Information contained herein reflects review and annotations entered in association with encounter. Interpretation of such information and data should be left to medically-trained personnel. Information provided to patient can be located elsewhere in the medical record under "Patient Instructions". Document created using STT-dictation technology, any transcriptional errors that may result from process are unintentional.    Patient: Matthew [name]  Service Category: Procedure  Provider: Gaspar Cola, MD  DOB: March 11, 1936  DOS: 07/24/2020  Location: [facility]  MRN: [id number]  Setting: Ambulatory - outpatient  Referring Provider: Antionette Char, MD  Type: Established Patient  Specialty: Interventional Pain Management  PCP: Antionette Char, MD   Primary Reason for Visit: Interventional Pain Management Treatment. CC: Back Pain (low)  Procedure:          Anesthesia, Analgesia, Anxiolysis:  Procedure #1: Type: Medial Branch Facet Block #3 Primary Purpose: Palliative Region: Lumbar Level: L2, L3, L4, L5, & S1 Medial Branch Level(s) Target Area: For Lumbar Facet blocks, the target is the groove formed by the junction of the transverse process and superior articular process. For the L5 dorsal ramus, the target is the notch between superior articular process and sacral ala. For the S1 dorsal ramus, the target is the superior and lateral edge of the posterior S1 Sacral foramen. Approach: Posterior, paramedial, percutaneous approach. Laterality: Bilateral  Procedure #2: Type: Sacroiliac Joint Block  #3  Primary Purpose: Palliative Region: Posterior Lumbosacral Level: PSIS (Posterior Superior Iliac Spine) Sacroiliac Joint Target Area: For upper sacroiliac joint block(s), the target is the superior and posterior margin of the sacroiliac joint. Approach: Ipsilateral approach. Laterality: Bilateral  Type: Moderate (Conscious) Sedation combined with Local  Anesthesia Indication(s): Analgesia and Anxiety Route: Intravenous (IV) IV Access: Secured Sedation: Meaningful verbal contact was maintained at all times during the procedure  Local Anesthetic: Lidocaine 1-2%  Position: Prone   Indications: 1. Lumbar facet syndrome (Bilateral) (L>R)   2. Spondylosis without myelopathy or radiculopathy, lumbosacral region   3. Chronic sacroiliac joint pain (Bilateral) (L>R)   4. Other spondylosis, sacral and sacrococcygeal region   5. Chronic low back pain (Primary Area of Pain) (Bilateral) (L>R)   6. Long term current use of anticoagulant (Plavix)    Pain Score: Pre-procedure: 4 /10 Post-procedure: 0-No pain/10   Pre-op Assessment:  Mr. Duquette is a 84 y.o. (year old), male patient, seen today for interventional treatment. 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 Mr. Mccollam has a current medication list which includes the following prescription(s): acetaminophen, aspirin ec, atorvastatin, carvedilol, cyanocobalamin, ferrous sulfate, isosorbide mononitrate, multiple vitamins-minerals, omega-3 fatty acids,  pantoprazole, entresto, tamsulosin, tetrahydrozoline, and turmeric, and the following Facility-Administered Medications: cyanocobalamin. His primarily concern today is the Back Pain (low)  Initial Vital Signs:  Pulse/HCG Rate: 68ECG Heart Rate: 70 Temp: (!) 97.2 F (36.2 C) Resp: 18 BP: (!) 157/81 SpO2: 99 %  BMI: Estimated body mass index is 29.53 kg/m as  calculated from the following:   Height as of this encounter: 5\' 9"  (1.753 m).   Weight as of this encounter: 200 lb (90.7 kg).  Risk Assessment: Allergies: Reviewed. He is allergic to ranexa [ranolazine er].  Allergy Precautions: None required Coagulopathies: Reviewed. None identified.  Blood-thinner therapy: None at this time Active Infection(s): Reviewed. None identified. Mr. Harari is afebrile  Site Confirmation: Mr. Javid was asked to confirm the procedure and laterality before marking the site Procedure checklist: Completed Consent: Before the procedure and under the influence of no sedative(s), amnesic(s), or anxiolytics, the patient was informed of the treatment options, risks and possible complications. To fulfill our ethical and legal obligations, as recommended by the American Medical Association's Code of Ethics, I have informed the patient of my clinical impression; the nature and purpose of the treatment or procedure; the risks, benefits, and possible complications of the intervention; the alternatives, including doing nothing; the risk(s) and benefit(s) of the alternative treatment(s) or procedure(s); and the risk(s) and benefit(s) of doing nothing. The patient was provided information about the general risks and possible complications associated with the procedure. These may include, but are not limited to: failure to achieve desired goals, infection, bleeding, organ or nerve damage, allergic reactions, paralysis, and death. In addition, the patient was informed of those risks and complications associated to Spine-related  procedures, such as failure to decrease pain; infection (i.e.: Meningitis, epidural or intraspinal abscess); bleeding (i.e.: epidural hematoma, subarachnoid hemorrhage, or any other type of intraspinal or peri-dural bleeding); organ or nerve damage (i.e.: Any type of peripheral nerve, nerve root, or spinal cord injury) with subsequent damage to sensory, motor, and/or autonomic systems, resulting in permanent pain, numbness, and/or weakness of one or several areas of the body; allergic reactions; (i.e.: anaphylactic reaction); and/or death. Furthermore, the patient was informed of those risks and complications associated with the medications. These include, but are not limited to: allergic reactions (i.e.: anaphylactic or anaphylactoid reaction(s)); adrenal axis suppression; blood sugar elevation that in diabetics may result in ketoacidosis or comma; water retention that in patients with history of congestive heart failure may result in shortness of breath, pulmonary edema, and decompensation with resultant heart failure; weight gain; swelling or edema; medication-induced neural toxicity; particulate matter embolism and blood vessel occlusion with resultant organ, and/or nervous system infarction; and/or aseptic necrosis of one or more joints. Finally, the patient was informed that Medicine is not an exact science; therefore, there is also the possibility of unforeseen or unpredictable risks and/or possible complications that may result in a catastrophic outcome. The patient indicated having understood very clearly. We have given the patient no guarantees and we have made no promises. Enough time was given to the patient to ask questions, all of which were answered to the patient's satisfaction. Mr. Carillo has indicated that he wanted to continue with the procedure. Attestation: I, the ordering provider, attest that I have discussed with the patient the benefits, risks, side-effects, alternatives, likelihood of  achieving goals, and potential problems during recovery for the procedure that I have provided informed consent. Date  Time: 07/24/2020  8:02 AM  Pre-Procedure Preparation:  Monitoring: As per clinic protocol. Respiration, ETCO2, SpO2, BP, heart rate and rhythm monitor placed and checked for adequate function Safety Precautions: Patient was assessed for positional comfort and pressure points before starting the procedure. Time-out: I initiated and conducted the "Time-out" before starting the procedure, as per protocol. The patient was asked to participate by confirming the accuracy of the "Time Out"  information. Verification of the correct person, site, and procedure were performed and confirmed by me, the nursing staff, and the patient. "Time-out" conducted as per Joint Commission's Universal Protocol (UP.01.01.01). Time: 45  Description of Procedure #1:   Time-out: "Time-out" completed before starting procedure, as per protocol. Area Prepped: Entire Posterior Lumbosacral Region DuraPrep (Iodine Povacrylex [0.7% available iodine] and Isopropyl Alcohol, 74% w/w) Safety Precautions: Aspiration looking for blood return was conducted prior to all injections. At no point did we inject any substances, as a needle was being advanced. No attempts were made at seeking any paresthesias. Safe injection practices and needle disposal techniques used. Medications properly checked for expiration dates. SDV (single dose vial) medications used.  Description of the Procedure: Protocol guidelines were followed. The patient was placed in position over the fluoroscopy table. The target area was identified and the area prepped in the usual manner. Skin & deeper tissues infiltrated with local anesthetic. Appropriate amount of time allowed to pass for local anesthetics to take effect. The procedure needle was introduced through the skin, ipsilateral to the reported pain, and advanced to the target area. Employing the  "Medial Branch Technique", the needles were advanced to the angle made by the superior and medial portion of the transverse process, and the lateral and inferior portion of the superior articulating process of the targeted vertebral bodies. This area is known as "Burton's Eye" or the "Eye of the [place] Dog". A procedure needle was introduced through the skin, and this time advanced to the angle made by the superior and medial border of the sacral ala, and the lateral border of the S1 vertebral body. This last needle was later repositioned at the superior and lateral border of the posterior S1 foramen. Negative aspiration confirmed. Solution injected in intermittent fashion, asking for systemic symptoms every 0.5cc of injectate. The needles were then removed and the area cleansed, making sure to leave some of the prepping solution back to take advantage of its long term bactericidal properties. Start Time: 0850 hrs. Materials:  Needle(s) Type: Spinal Needle Gauge: 22G Length: 3.5-in Medication(s): Please see orders for medications and dosing details.  Description of Procedure # 2:   Area Prepped: Entire Posterior Lumbosacral Region DuraPrep (Iodine Povacrylex [0.7% available iodine] and Isopropyl Alcohol, 74% w/w) Safety Precautions: Aspiration looking for blood return was conducted prior to all injections. At no point did we inject any substances, as a needle was being advanced. No attempts were made at seeking any paresthesias. Safe injection practices and needle disposal techniques used. Medications properly checked for expiration dates. SDV (single dose vial) medications used. Description of the Procedure: Protocol guidelines were followed. The patient was placed in position over the fluoroscopy table. The target area was identified and the area prepped in the usual manner. Skin & deeper tissues infiltrated with local anesthetic. Appropriate amount of time allowed to pass for local anesthetics to  take effect. The procedure needle was advanced under fluoroscopic guidance into the sacroiliac joint until a firm endpoint was obtained. Proper needle placement secured. Negative aspiration confirmed. Solution injected in intermittent fashion, asking for systemic symptoms every 0.5cc of injectate. The needles were then removed and the area cleansed, making sure to leave some of the prepping solution back to take advantage of its long term bactericidal properties. Vitals:   07/24/20 0915 07/24/20 0918 07/24/20 0925 07/24/20 0935  BP: (!) 160/85  (!) 146/79 (!) 153/78  Pulse:      Resp: 13  14 16   Temp: 98.2 F (  36.8 C) 98 F (36.7 C)  98 F (36.7 C)  SpO2: 95%  95% 95%  Weight:      Height:        End Time: 0903 hrs. Materials:  Needle(s) Type: Spinal Needle Gauge: 22G Length: 3.5-in Medication(s): Please see orders for medications and dosing details.  Imaging Guidance (Spinal):          Type of Imaging Technique: Fluoroscopy Guidance (Spinal) Indication(s): Assistance in needle guidance and placement for procedures requiring needle placement in or near specific anatomical locations not easily accessible without such assistance. Exposure Time: Please see nurses notes. Contrast: None used. Fluoroscopic Guidance: I was personally present during the use of fluoroscopy. "Tunnel Vision Technique" used to obtain the best possible view of the target area. Parallax error corrected before commencing the procedure. "Direction-depth-direction" technique used to introduce the needle under continuous pulsed fluoroscopy. Once target was reached, antero-posterior, oblique, and lateral fluoroscopic projection used confirm needle placement in all planes. Images permanently stored in EMR. Interpretation: No contrast injected. I personally interpreted the imaging intraoperatively. Adequate needle placement confirmed in multiple planes. Permanent images saved into the patient's record.  Antibiotic  Prophylaxis:   Anti-infectives (From admission, onward)   None     Indication(s): None identified  Post-operative Assessment:  Post-procedure Vital Signs:  Pulse/HCG Rate: 6871 Temp: 98 F (36.7 C) Resp: 16 BP: (!) 153/78 SpO2: 95 %  EBL: None  Complications: No immediate post-treatment complications observed by team, or reported by patient.  Note: The patient tolerated the entire procedure well. A repeat set of vitals were taken after the procedure and the patient was kept under observation following institutional policy, for this type of procedure. Post-procedural neurological assessment was performed, showing return to baseline, prior to discharge. The patient was provided with post-procedure discharge instructions, including a section on how to identify potential problems. Should any problems arise concerning this procedure, the patient was given instructions to immediately contact us, at any time, without hesitation. In any case, we plan to contact the patient by telephone for a follow-up status report regarding this interventional procedure.  Comments:  No additional relevant information.  Plan of Care  Orders:  No orders of the defined types were placed in this encounter.  Chronic Opioid Analgesic:  Tramadol 50 mg 1 tablet by mouth every 8 hours (150 mg/day of tramadol) (15 MME/day) MME/day: 15 mg/day.   Medications ordered for procedure: No orders of the defined types were placed in this encounter.  Medications administered: Nehemiah Settle. Stfort had no medications administered during this visit.  See the medical record for exact dosing, route, and time of administration.  Follow-up plan:   No follow-ups on file.       Interventional management options: Planned, scheduled, and/or pending:  NOTE:Stop Plavix for7 days forprocedures.    Considering:   Possible bilateral lumbar facet RFA #1 Possible bilateral SI joint RFA #1 Diagnostic left L2 TFESI Diagnostic  bilateral L3 TFESI Diagnostic bilateral hip injections   Palliative PRN treatment(s):   Diagnostic bilateral lumbar facet block #3  Diagnostic bilateral SI joint block #3  Therapeutic/palliative left L4-5 LESI #3     Recent Visits Date Type Provider Dept  07/14/20 Office Visit Milinda Pointer, MD [facility]  Showing recent visits within past 90 days and meeting all other requirements Today's Visits Date Type Provider Dept  07/24/20 Procedure visit Milinda Pointer, MD [facility]  Showing today's visits and meeting all other requirements Future Appointments Date Type  Provider Dept  08/11/20 Appointment Milinda Pointer, MD [facility]  Showing future appointments within next 90 days and meeting all other requirements  Disposition: Discharge home  Discharge (Date  Time): 07/24/2020; 0937 hrs.   Primary Care Physician: Antionette Char, MD Location: [facility] Note by: Gaspar Cola, MD Date: 07/24/2020; Time: 10:14 AM  Disclaimer:  Medicine is not an Chief Strategy Officer. The only guarantee in medicine is that nothing is guaranteed. It is important to note that the decision to proceed with this intervention was based on the information collected from the patient. The Data and conclusions were drawn from the patient's questionnaire, the interview, and the physical examination. Because the information was provided in large part by the patient, it cannot be guaranteed that it has not been purposely or unconsciously manipulated. Every effort has been made to obtain as much relevant data as possible for this evaluation. It is important to note that the conclusions that lead to this procedure are derived in large part from the available data. Always take into account that the treatment will also be dependent on availability of resources and existing treatment guidelines, considered by other Pain Management Practitioners as being  common knowledge and practice, at the time of the intervention. For Medico-Legal purposes, it is also important to point out that variation in procedural techniques and pharmacological choices are the acceptable norm. The indications, contraindications, technique, and results of the above procedure should only be interpreted and judged by a Board-Certified Interventional Pain Specialist with extensive familiarity and expertise in the same exact procedure and technique.

## 2020-07-24 NOTE — Progress Notes (Signed)
Safety precautions to be maintained throughout the outpatient stay will include: orient to surroundings, keep bed in low position, maintain call bell within reach at all times, provide assistance with transfer out of bed and ambulation.  

## 2020-07-24 NOTE — Patient Instructions (Signed)

## 2020-07-25 ENCOUNTER — Telehealth: Payer: Self-pay | Admitting: Pain Medicine

## 2020-07-25 ENCOUNTER — Telehealth: Payer: Self-pay | Admitting: *Deleted

## 2020-07-25 NOTE — Telephone Encounter (Signed)
Patient returned call from [place], states he is feeling much better and pain is pretty much minimal. Says thank you to Dr. Dossie Arbour and nurses.

## 2020-07-25 NOTE — Telephone Encounter (Signed)
Voicemail left re; procedure on yesterday.  Please call if there are any questions or concerns.

## 2020-07-29 ENCOUNTER — Ambulatory Visit (INDEPENDENT_AMBULATORY_CARE_PROVIDER_SITE_OTHER): Payer: Medicare Other | Admitting: *Deleted

## 2020-07-29 DIAGNOSIS — I428 Other cardiomyopathies: Secondary | ICD-10-CM | POA: Diagnosis not present

## 2020-07-29 LAB — CUP PACEART REMOTE DEVICE CHECK
Battery Remaining Longevity: 80 mo
Battery Remaining Percentage: 95.5 %
Battery Voltage: 2.99 V
Brady Statistic AP VP Percent: 84 %
Brady Statistic AP VS Percent: 1 %
Brady Statistic AS VP Percent: 11 %
Brady Statistic AS VS Percent: 1 %
Brady Statistic RA Percent Paced: 82 %
Date Time Interrogation Session: 20210817040010
Implantable Lead Implant Date: 20200810
Implantable Lead Implant Date: 20200810
Implantable Lead Implant Date: 20200810
Implantable Lead Location: 753858
Implantable Lead Location: 753859
Implantable Lead Location: 753860
Implantable Lead Model: 5076
Implantable Lead Model: 5076
Implantable Pulse Generator Implant Date: 20200810
Lead Channel Impedance Value: 430 Ohm
Lead Channel Impedance Value: 540 Ohm
Lead Channel Impedance Value: 540 Ohm
Lead Channel Pacing Threshold Amplitude: 0.75 V
Lead Channel Pacing Threshold Amplitude: 1 V
Lead Channel Pacing Threshold Amplitude: 1 V
Lead Channel Pacing Threshold Pulse Width: 0.4 ms
Lead Channel Pacing Threshold Pulse Width: 0.4 ms
Lead Channel Pacing Threshold Pulse Width: 1 ms
Lead Channel Sensing Intrinsic Amplitude: 1.4 mV
Lead Channel Sensing Intrinsic Amplitude: 12 mV
Lead Channel Setting Pacing Amplitude: 2 V
Lead Channel Setting Pacing Amplitude: 2 V
Lead Channel Setting Pacing Amplitude: 2 V
Lead Channel Setting Pacing Pulse Width: 0.4 ms
Lead Channel Setting Pacing Pulse Width: 1 ms
Lead Channel Setting Sensing Sensitivity: 2 mV
Pulse Gen Model: 3562
Pulse Gen Serial Number: 9130392

## 2020-07-31 NOTE — Progress Notes (Signed)
Remote pacemaker transmission.   

## 2020-08-05 ENCOUNTER — Other Ambulatory Visit: Payer: Self-pay | Admitting: Physician Assistant

## 2020-08-05 ENCOUNTER — Other Ambulatory Visit: Payer: Self-pay | Admitting: Cardiovascular Disease

## 2020-08-07 ENCOUNTER — Ambulatory Visit: Payer: Medicare Other | Admitting: Cardiovascular Disease

## 2020-08-07 ENCOUNTER — Encounter: Payer: Self-pay | Admitting: Cardiovascular Disease

## 2020-08-07 ENCOUNTER — Other Ambulatory Visit: Payer: Self-pay

## 2020-08-07 VITALS — BP 128/62 | HR 70 | Ht 69.0 in | Wt 196.4 lb

## 2020-08-07 DIAGNOSIS — Z952 Presence of prosthetic heart valve: Secondary | ICD-10-CM

## 2020-08-07 DIAGNOSIS — I359 Nonrheumatic aortic valve disorder, unspecified: Secondary | ICD-10-CM

## 2020-08-07 DIAGNOSIS — I5022 Chronic systolic (congestive) heart failure: Secondary | ICD-10-CM

## 2020-08-07 DIAGNOSIS — I2510 Atherosclerotic heart disease of native coronary artery without angina pectoris: Secondary | ICD-10-CM | POA: Diagnosis not present

## 2020-08-07 DIAGNOSIS — R079 Chest pain, unspecified: Secondary | ICD-10-CM | POA: Diagnosis not present

## 2020-08-07 DIAGNOSIS — E785 Hyperlipidemia, unspecified: Secondary | ICD-10-CM

## 2020-08-07 NOTE — H&P (View-Only) (Signed)
Cardiology Office Note   Date:  08/07/2020   ID:  Matthew [name], DOB 1936-05-22, MRN [id number]  PCP:  Antionette Char, MD  Cardiologist:   [name], MD   Chief Complaint  Patient presents with  . Follow-up    4 Months follow up. Medications verbally reviewed with patient and       History of Present Illness: Matthew [name] is a 84 y.o. male who presents for a follow-up visit regarding coronary artery disease , aortic stenosis, chronic systolic heart failure and peripheral arterial disease. He is status post CABG twice in 1991 and 2004. He is status post bilateral common iliac artery kissing stent placement in 2015. He is status post TAVR in June 2017. Cardiac catheterization before that showed patent grafts. He developed left bundle branch block post TAVR and this correlated with a gradual decline in his ejection fraction.  Before TAVR, his EF was 55 to 60%.  Shortly after TAVR, his EF was 55%.  In 2018, his EF was noted to be 45 to 50% and then dropped to 30 to 35% in 2019.  Most recent cardiac catheterization was in 2019 for worsening angina.  It showed significant underlying three-vessel coronary artery disease with patent LIMA to LAD, patent RIMA to diagonal, patent SVG to OM 3 and patent SVG to right PDA.  The SVG to OM 2 was found to be occluded.  However, OM 2 had retrograde flow from SVG to 3.  There was possible left subclavian artery stenosis but it was not significant by gradient.  This was also interrogated by carotid Doppler and there was nothing to suggest significant left subclavian stenosis. He has known history of frequent PVCs that improved with treatment.  He was hospitalized in October, 2019 with GI bleed and blood loss anemia.  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  His anemia improved with IV iron.  Most recent echocardiogram in March of 2020 showed an EF of 25 to 30%, mild aortic stenosis with mean gradient of 17 mmHg and mild to moderate  aortic regurgitation. The patient had biventricular pacemaker placement in August 2020 due to persistently low ejection fraction and left bundle branch block.  Reports worsening episodes of substernal chest pain and tightness both at rest and with physical activities. He also reports two recent episodes of presyncope while he was mowing the lawn.  Past Medical History:  Diagnosis Date  . Alcohol abuse    stopped drinking 42 years ago from today 2020  . Anemia   . AS (aortic stenosis)    a. Echo 6/10: EF 55% mild AS; b. echo 06/2015; EF 55-60%, GR1DD, moderate AS, Peak velocity (S): 346 cm/s. Mean gradientS): 28 mm Hg. Peak gradient (S): 48 mm Hg. Valve area (VTI): 1.18 cm2;   c. Echo 4/17 - mild LVH, EF 55-60%, no RWMA, Gr 1 DD, mod to severe AS (mean 28 mmHg, peak 46 mmHg), mild LAE  . CAD (coronary artery disease)    a. MI 1996 w/ CABG 1996; b.redo CABG 06/2003; c. Myoview 7/09: EF 54% inferobasal infarct, no ischemia. Myoview 6/10 EF 43% inf wall infarct. no ischemia; c. cath 8/16 s/p DES to VG-OM2. OTW 3VD w/ patent VG->RPDA, VG->OM3,  & LIMA->LAD.  EF 55-65%.  . Carotid bruit    2009 0-39% on dopplers bilatrally  . Chest pain   . GERD (gastroesophageal reflux disease)   . History of kidney stones   . HOH (hard  of hearing)   . HTN (hypertension)   . Hyperlipidemia   . Myocardial infarction ([place]) 1996  . Nephrolithiasis   . OSA (obstructive sleep apnea)   . PAD (peripheral artery disease) ([place]) 02/2014   Subtotal occlusion of right common iliac artery and 70% stenosis in the left common iliac artery. Status post bilateral kissing stent placement. 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 Shortness of breath    with exertion  . Thrombocytopenia ([place])     Past Surgical History:  Procedure Laterality Date  . ABDOMINAL  AORTAGRAM N/A 03/06/2014   Procedure: ABDOMINAL Maxcine Ham;  Surgeon: Wellington Hampshire, MD;  Location: [place] CATH LAB;  Service: Cardiovascular;  Laterality: N/A;  . BIV PACEMAKER INSERTION CRT-P N/A 07/23/2019   Procedure: BIV PACEMAKER INSERTION CRT-P;  Surgeon: Deboraha Sprang, MD;  Location: [place] CV LAB;  Service: Cardiovascular;  Laterality: N/A;  . CARDIAC CATHETERIZATION  02/2014   Severe three-vessel coronary artery disease with patent grafts.   Marland Kitchen CARDIAC CATHETERIZATION N/A 07/31/2015   Procedure: Right/Left Heart Cath and Coronary/Graft Angiography;  Surgeon: Wellington Hampshire, MD;  Location: [place] CV LAB;  Service: Cardiovascular;  Laterality: N/A;  . CARDIAC CATHETERIZATION N/A 07/31/2015   Procedure: Coronary Stent Intervention;  Surgeon: Wellington Hampshire, MD;  Location: [place] CV LAB;  Service: Cardiovascular;  Laterality: N/A;  . CARDIAC CATHETERIZATION N/A 05/26/2016   Procedure: Right/Left Heart Cath and Coronary Angiography;  Surgeon: Wellington Hampshire, MD;  Location: [place] CV LAB;  Service: Cardiovascular;  Laterality: N/A;  . COLONOSCOPY WITH PROPOFOL N/A 10/07/2018   Procedure: COLONOSCOPY WITH PROPOFOL;  Surgeon: Lin Landsman, MD;  Location: [facility] END[facility]COPY;  Service: Gastroenterology;  Laterality: N/A;  . COLONOSCOPY WITH PROPOFOL N/A 01/08/2019   Procedure: COLONOSCOPY WITH PROPOFOL;  Surgeon: Lin Landsman, MD;  Location: [facility] ENDOSCOPY;  Service: Gastroenterology;  Laterality: N/A;  . CORONARY ARTERY BYPASS GRAFT  1991   at [facility]. Redone 2004-3 vessels 1st time and 4 second time  . CYSTO     2/3 times for kidney stones  . ENTEROSCOPY N/A 12/25/2018   Procedure: ENTEROSCOPY Single balloon;  Surgeon: Lin Landsman, MD;  Location: [place];  Service: Gastroenterology;  Laterality: N/A;  . ESOPHAGOGASTRODUODENOSCOPY (EGD) WITH PROPOFOL N/A 10/07/2018   Procedure: ESOPHAGOGASTRODUODENOSCOPY (EGD) WITH PROPOFOL;  Surgeon: Lin Landsman, MD;  Location: [facility] ENDOSCOPY;  Service: Gastroenterology;  Laterality: N/A;  . GIVENS CAPSULE STUDY N/A 12/22/2018   Procedure: GIVENS CAPSULE STUDY;  Surgeon: Lin Landsman, MD;  Location: [facility] ENDOSCOPY;  Service: Gastroenterology;  Laterality: N/A;  . INGUINAL HERNIA REPAIR  11/10/2011   Procedure: HERNIA REPAIR INGUINAL ADULT;  Surgeon: Donato Heinz;  Location: [facility];  Service: General;  Laterality: Right;  . LAPAROSCOPIC RIGHT COLECTOMY Right 02/09/2019   Procedure: LAPAROSCOPIC RIGHT COLECTOMY;  Surgeon: Jules Husbands, MD;  Location: [facility] ORS;  Service: General;  Laterality: Right;  . LEFT HEART CATH AND CORONARY ANGIOGRAPHY Left 05/15/2018   Procedure: LEFT HEART CATH AND CORONARY ANGIOGRAPHY;  Surgeon: Wellington Hampshire, MD;  Location: [place] CV LAB;  Service: Cardiovascular;  Laterality: Left;  . LEFT HEART CATHETERIZATION WITH CORONARY/GRAFT ANGIOGRAM N/A 03/06/2014   Procedure: LEFT HEART CATHETERIZATION WITH Beatrix Fetters;  Surgeon: Wellington Hampshire, MD;  Location: [place] CATH LAB;  Service: Cardiovascular;  Laterality:  N/A;  . STOMACH SURGERY     removal of gastric ulcers  . TEE WITHOUT CARDIOVERSION N/A 06/01/2016   Procedure: TRANSESOPHAGEAL ECHOCARDIOGRAM (TEE);  Surgeon: Sherren Mocha, MD;  Location: [place];  Service: Open Heart Surgery;  Laterality: N/A;  . TRANSCATHETER AORTIC VALVE REPLACEMENT, TRANSFEMORAL N/A 06/01/2016   Procedure: TRANSCATHETER AORTIC VALVE REPLACEMENT, TRANSFEMORAL;  Surgeon: Sherren Mocha, MD;  Location: [place];  Service: Open Heart Surgery;  Laterality: N/A;     Current Outpatient Medications  Medication Sig Dispense Refill  . acetaminophen (TYLENOL) 325 MG tablet Take 650 mg by mouth every 6 (six) hours as needed for mild pain or headache.     Marland Kitchen aspirin EC 81 MG tablet Take 1 tablet (81 mg total) by mouth daily. 90 tablet 3  . atorvastatin (LIPITOR) 80 MG tablet Take 1 tablet (80 mg total) by mouth daily at 6 PM. 90 tablet  1  . carvedilol (COREG) 6.25 MG tablet TAKE 1 TABLET BY MOUTH  TWICE DAILY 180 tablet 3  . Cyanocobalamin (VITAMIN B 12 PO) Take 1 tablet by mouth daily.    . Ferrous Sulfate (IRON PO) Take 1 tablet by mouth daily.    . isosorbide mononitrate (IMDUR) 60 MG 24 hr tablet Take 1 tablet (60 mg total) by mouth daily. 90 tablet 3  . Multiple Vitamins-Minerals (PRESERVISION AREDS PO) Take 1 tablet by mouth daily.     . Omega-3 Fatty Acids (FISH OIL PO) Take 1 capsule by mouth daily.     . pantoprazole (PROTONIX) 40 MG tablet Take 1 tablet (40 mg total) by mouth daily. 90 tablet 1  . sacubitril-valsartan (ENTRESTO) 49-51 MG Take 1 tablet by mouth 2 (two) times daily. 180 tablet 1  . tamsulosin (FLOMAX) 0.4 MG CAPS capsule Take 1 capsule (0.4 mg total) by mouth daily. Future refill request will need to be sent to the pcp 90 capsule 1  . tetrahydrozoline (VISINE) 0.05 % ophthalmic solution Place 2 drops into both eyes 4 (four) times daily as needed (for dry eyes).    . TURMERIC PO Take 1 capsule by mouth at bedtime.      No current facility-administered medications for this visit.   Facility-Administered Medications Ordered in Other Visits  Medication Dose Route Frequency Provider Last Rate Last Admin  . cyanocobalamin ((VITAMIN B-12)) injection 1,000 mcg  1,000 mcg Intramuscular Weekly Sindy Guadeloupe, MD   1,000 mcg at 02/25/20 1447    Allergies:   Ranexa [ranolazine er]    Social History:  The patient  reports that he quit smoking about 30 years ago. His smoking use included cigarettes. He has a 50.00 pack-year smoking history. He has never used smokeless tobacco. He reports that he does not drink alcohol and does not use drugs.   Family History:  The patient's family history includes Heart disease in his father and mother.    ROS:  Please see the history of present illness.   Otherwise, review of systems are positive for none.   All other systems are reviewed and negative.    PHYSICAL  EXAM: VS:  BP 128/62 (BP Location: Left Arm, Patient Position: Sitting, Cuff Size: Normal)   Pulse 70   Ht 5\' 9"  (1.753 m)   Wt 196 lb 6.4 oz (89.1 kg)   SpO2 97%   BMI 29.00 kg/m  , BMI Body mass index is 29 kg/m. GEN: Well nourished, well developed, in no acute distress  HEENT: normal  Neck: no JVD, carotid bruits, or  masses Cardiac: RRR; no rubs, or gallops,no edema .   2/6 crescendo decrescendo aortic murmur which is early peaking. Respiratory:  clear to auscultation bilaterally, normal work of breathing GI: soft, nontender, nondistended, + BS MS: no deformity or atrophy  Skin: warm and dry, no rash Neuro:  Strength and sensation are intact Psych: euthymic mood, full affect   EKG:  EKG is ordered today. The ekg ordered today demonstrates AV sequential pacemaker.   Recent Labs: 05/26/2020: Hemoglobin 13.6; Platelets 98    Lipid Panel    Component Value Date/Time   CHOL 122 10/02/2018 0847   CHOL 160 04/11/2018 1451   TRIG 147 10/02/2018 0847   HDL 30 (L) 10/02/2018 0847   HDL 44 04/11/2018 1451   CHOLHDL 4.1 10/02/2018 0847   VLDL 29 10/02/2018 0847   LDLCALC 63 10/02/2018 0847   LDLCALC 63 04/11/2018 1451      Wt Readings from Last 3 Encounters:  08/07/20 196 lb 6.4 oz (89.1 kg)  07/24/20 200 lb (90.7 kg)  07/14/20 200 lb (90.7 kg)        ASSESSMENT AND PLAN:  1.  S/p TAVR  for severe symptomatic aortic stenosis: Most recent echocardiogram in March 2020 showed mildly elevated gradient at 17 mmHg with mild to moderate aortic regurgitation.  He is currently [place] Heart Association class II. Given recent episodes of chest pain, I requested an echocardiogram to evaluate his aortic valve and also his ejection fraction.  2. Coronary artery disease involving graft bypass with worsening angina: He had improvement after increasing Imdur but now reports recurrent symptoms. I requested Lexiscan Myoview.  3.  Chronic systolic heart failure: Most recent ejection  fraction was 25 to 30% but since then he underwent CRT with improvement in symptoms. His blood pressure improved after decreasing the dose of Entresto. Continue this with carvedilol. Check echocardiogram.  4. Essential hypertension: Blood pressure is controlled.  5. Peripheral arterial disease: No recurrent claudication since bilateral iliac stenting.   6. Hyperlipidemia: Continue high-dose atorvastatin .  Most recent lipid profile in May showed a triglyceride of 168 and an LDL of 82.  Previously, his LDL was less than 70.  Continue same treatment for now and we can consider adding Zetia if LDL remains above 70.  Disposition:   FU with me in 1 months  Signed,  [name], MD  08/07/2020 4:02 PM    [facility]

## 2020-08-07 NOTE — Progress Notes (Signed)
Cardiology Office Note   Date:  08/07/2020   ID:  [name], DOB 11-Apr-1936, MRN [id number]  PCP:  Antionette Char, MD  Cardiologist:   [name], MD   Chief Complaint  Patient presents with   Follow-up    4 Months follow up. Medications verbally reviewed with patient and       History of Present Illness: Matthew [name] is a 84 y.o. male who presents for a follow-up visit regarding coronary artery disease , aortic stenosis, chronic systolic heart failure and peripheral arterial disease. He is status post CABG twice in 1991 and 2004. He is status post bilateral common iliac artery kissing stent placement in 2015. He is status post TAVR in June 2017. Cardiac catheterization before that showed patent grafts. He developed left bundle branch block post TAVR and this correlated with a gradual decline in his ejection fraction.  Before TAVR, his EF was 55 to 60%.  Shortly after TAVR, his EF was 55%.  In 2018, his EF was noted to be 45 to 50% and then dropped to 30 to 35% in 2019.  Most recent cardiac catheterization was in 2019 for worsening angina.  It showed significant underlying three-vessel coronary artery disease with patent LIMA to LAD, patent RIMA to diagonal, patent SVG to OM 3 and patent SVG to right PDA.  The SVG to OM 2 was found to be occluded.  However, OM 2 had retrograde flow from SVG to 3.  There was possible left subclavian artery stenosis but it was not significant by gradient.  This was also interrogated by carotid Doppler and there was nothing to suggest significant left subclavian stenosis. He has known history of frequent PVCs that improved with treatment.  He was hospitalized in October, 2019 with GI bleed and blood loss anemia.  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  His anemia improved with IV iron.  Most recent echocardiogram in March of 2020 showed an EF of 25 to 30%, mild aortic stenosis with mean gradient of 17 mmHg and mild to moderate  aortic regurgitation. The patient had biventricular pacemaker placement in August 2020 due to persistently low ejection fraction and left bundle branch block.  Reports worsening episodes of substernal chest pain and tightness both at rest and with physical activities. He also reports two recent episodes of presyncope while he was mowing the lawn.  Past Medical History:  Diagnosis Date   Alcohol abuse    stopped drinking 42 years ago from today 2020   Anemia    AS (aortic stenosis)    a. Echo 6/10: EF 55% mild AS; b. echo 06/2015; EF 55-60%, GR1DD, moderate AS, Peak velocity (S): 346 cm/s. Mean gradientS): 28 mm Hg. Peak gradient (S): 48 mm Hg. Valve area (VTI): 1.18 cm2;   c. Echo 4/17 - mild LVH, EF 55-60%, no RWMA, Gr 1 DD, mod to severe AS (mean 28 mmHg, peak 46 mmHg), mild LAE   CAD (coronary artery disease)    a. MI 1996 w/ CABG 1996; b.redo CABG 06/2003; c. Myoview 7/09: EF 54% inferobasal infarct, no ischemia. Myoview 6/10 EF 43% inf wall infarct. no ischemia; c. cath 8/16 s/p DES to VG-OM2. OTW 3VD w/ patent VG->RPDA, VG->OM3,  & LIMA->LAD.  EF 55-65%.   Carotid bruit    2009 0-39% on dopplers bilatrally   Chest pain    GERD (gastroesophageal reflux disease)    History of kidney stones    HOH (hard  of hearing)    HTN (hypertension)    Hyperlipidemia    Myocardial infarction ([place]) 1996   Nephrolithiasis    OSA (obstructive sleep apnea)    PAD (peripheral artery disease) ([place]) 02/2014   Subtotal occlusion of right common iliac artery and 70% stenosis in the left common iliac artery. Status post bilateral kissing stent placement. Significant post stenosis aneurysmal dilatation on the right side (any future catheterization through the right femoral artery should be done cautiously to avoid advancing the wire behind the stent struts)   Shortness of breath    with exertion   Thrombocytopenia [facility])     Past Surgical History:  Procedure Laterality Date   ABDOMINAL  AORTAGRAM N/A 03/06/2014   Procedure: ABDOMINAL Maxcine Ham;  Surgeon: Wellington Hampshire, MD;  Location: [facility] CATH LAB;  Service: Cardiovascular;  Laterality: N/A;   BIV PACEMAKER INSERTION CRT-P N/A 07/23/2019   Procedure: BIV PACEMAKER INSERTION CRT-P;  Surgeon: Deboraha Sprang, MD;  Location: [place] CV LAB;  Service: Cardiovascular;  Laterality: N/A;   CARDIAC CATHETERIZATION  02/2014   Severe three-vessel coronary artery disease with patent grafts.    CARDIAC CATHETERIZATION N/A 07/31/2015   Procedure: Right/Left Heart Cath and Coronary/Graft Angiography;  Surgeon: Wellington Hampshire, MD;  Location: [place] CV LAB;  Service: Cardiovascular;  Laterality: N/A;   CARDIAC CATHETERIZATION N/A 07/31/2015   Procedure: Coronary Stent Intervention;  Surgeon: Wellington Hampshire, MD;  Location: [place] CV LAB;  Service: Cardiovascular;  Laterality: N/A;   CARDIAC CATHETERIZATION N/A 05/26/2016   Procedure: Right/Left Heart Cath and Coronary Angiography;  Surgeon: Wellington Hampshire, MD;  Location: [place] CV LAB;  Service: Cardiovascular;  Laterality: N/A;   COLONOSCOPY WITH PROPOFOL N/A 10/07/2018   Procedure: COLONOSCOPY WITH PROPOFOL;  Surgeon: Lin Landsman, MD;  Location: [facility] ENDOSCOPY;  Service: Gastroenterology;  Laterality: N/A;   COLONOSCOPY WITH PROPOFOL N/A 01/08/2019   Procedure: COLONOSCOPY WITH PROPOFOL;  Surgeon: Lin Landsman, MD;  Location: [facility] ENDOSCOPY;  Service: Gastroenterology;  Laterality: N/A;   CORONARY ARTERY BYPASS GRAFT  1991   at [facility]. Redone 2004-3 vessels 1st time and 4 second time   CYSTO     2/3 times for kidney stones   ENTEROSCOPY N/A 12/25/2018   Procedure: ENTEROSCOPY Single balloon;  Surgeon: Lin Landsman, MD;  Location: [place];  Service: Gastroenterology;  Laterality: N/A;   ESOPHAGOGASTRODUODENOSCOPY (EGD) WITH PROPOFOL N/A 10/07/2018   Procedure: ESOPHAGOGASTRODUODENOSCOPY (EGD) WITH PROPOFOL;  Surgeon: Lin Landsman, MD;  Location: [facility] ENDOSCOPY;  Service: Gastroenterology;  Laterality: N/A;   GIVENS CAPSULE STUDY N/A 12/22/2018   Procedure: GIVENS CAPSULE STUDY;  Surgeon: Lin Landsman, MD;  Location: [facility] ENDOSCOPY;  Service: Gastroenterology;  Laterality: N/A;   INGUINAL HERNIA REPAIR  11/10/2011   Procedure: HERNIA REPAIR INGUINAL ADULT;  Surgeon: Donato Heinz;  Location: [facility];  Service: General;  Laterality: Right;   LAPAROSCOPIC RIGHT COLECTOMY Right 02/09/2019   Procedure: LAPAROSCOPIC RIGHT COLECTOMY;  Surgeon: Jules Husbands, MD;  Location: [facility] ORS;  Service: General;  Laterality: Right;   LEFT HEART CATH AND CORONARY ANGIOGRAPHY Left 05/15/2018   Procedure: LEFT HEART CATH AND CORONARY ANGIOGRAPHY;  Surgeon: Wellington Hampshire, MD;  Location: [place] CV LAB;  Service: Cardiovascular;  Laterality: Left;   LEFT HEART CATHETERIZATION WITH CORONARY/GRAFT ANGIOGRAM N/A 03/06/2014   Procedure: LEFT HEART CATHETERIZATION WITH Beatrix Fetters;  Surgeon: Wellington Hampshire, MD;  Location: [place] CATH LAB;  Service: Cardiovascular;  Laterality:  N/A;   STOMACH SURGERY     removal of gastric ulcers   TEE WITHOUT CARDIOVERSION N/A 06/01/2016   Procedure: TRANSESOPHAGEAL ECHOCARDIOGRAM (TEE);  Surgeon: Sherren Mocha, MD;  Location: [place];  Service: Open Heart Surgery;  Laterality: N/A;   TRANSCATHETER AORTIC VALVE REPLACEMENT, TRANSFEMORAL N/A 06/01/2016   Procedure: TRANSCATHETER AORTIC VALVE REPLACEMENT, TRANSFEMORAL;  Surgeon: Sherren Mocha, MD;  Location: [place];  Service: Open Heart Surgery;  Laterality: N/A;     Current Outpatient Medications  Medication Sig Dispense Refill   acetaminophen (TYLENOL) 325 MG tablet Take 650 mg by mouth every 6 (six) hours as needed for mild pain or headache.      aspirin EC 81 MG tablet Take 1 tablet (81 mg total) by mouth daily. 90 tablet 3   atorvastatin (LIPITOR) 80 MG tablet Take 1 tablet (80 mg total) by mouth daily at 6 PM. 90 tablet  1   carvedilol (COREG) 6.25 MG tablet TAKE 1 TABLET BY MOUTH  TWICE DAILY 180 tablet 3   Cyanocobalamin (VITAMIN B 12 PO) Take 1 tablet by mouth daily.     Ferrous Sulfate (IRON PO) Take 1 tablet by mouth daily.     isosorbide mononitrate (IMDUR) 60 MG 24 hr tablet Take 1 tablet (60 mg total) by mouth daily. 90 tablet 3   Multiple Vitamins-Minerals (PRESERVISION AREDS PO) Take 1 tablet by mouth daily.      Omega-3 Fatty Acids (FISH OIL PO) Take 1 capsule by mouth daily.      pantoprazole (PROTONIX) 40 MG tablet Take 1 tablet (40 mg total) by mouth daily. 90 tablet 1   sacubitril-valsartan (ENTRESTO) 49-51 MG Take 1 tablet by mouth 2 (two) times daily. 180 tablet 1   tamsulosin (FLOMAX) 0.4 MG CAPS capsule Take 1 capsule (0.4 mg total) by mouth daily. Future refill request will need to be sent to the pcp 90 capsule 1   tetrahydrozoline (VISINE) 0.05 % ophthalmic solution Place 2 drops into both eyes 4 (four) times daily as needed (for dry eyes).     TURMERIC PO Take 1 capsule by mouth at bedtime.      No current facility-administered medications for this visit.   Facility-Administered Medications Ordered in Other Visits  Medication Dose Route Frequency Provider Last Rate Last Admin   cyanocobalamin ((VITAMIN B-12)) injection 1,000 mcg  1,000 mcg Intramuscular Weekly Sindy Guadeloupe, MD   1,000 mcg at 02/25/20 1447    Allergies:   Ranexa [ranolazine er]    Social History:  The patient  reports that he quit smoking about 30 years ago. His smoking use included cigarettes. He has a 50.00 pack-year smoking history. He has never used smokeless tobacco. He reports that he does not drink alcohol and does not use drugs.   Family History:  The patient's family history includes Heart disease in his father and mother.    ROS:  Please see the history of present illness.   Otherwise, review of systems are positive for none.   All other systems are reviewed and negative.    PHYSICAL  EXAM: VS:  BP 128/62 (BP Location: Left Arm, Patient Position: Sitting, Cuff Size: Normal)    Pulse 70    Ht 5\' 9"  (1.753 m)    Wt 196 lb 6.4 oz (89.1 kg)    SpO2 97%    BMI 29.00 kg/m  , BMI Body mass index is 29 kg/m. GEN: Well nourished, well developed, in no acute distress  HEENT: normal  Neck:  no JVD, carotid bruits, or masses Cardiac: RRR; no rubs, or gallops,no edema .   2/6 crescendo decrescendo aortic murmur which is early peaking. Respiratory:  clear to auscultation bilaterally, normal work of breathing GI: soft, nontender, nondistended, + BS MS: no deformity or atrophy  Skin: warm and dry, no rash Neuro:  Strength and sensation are intact Psych: euthymic mood, full affect   EKG:  EKG is ordered today. The ekg ordered today demonstrates AV sequential pacemaker.   Recent Labs: 05/26/2020: Hemoglobin 13.6; Platelets 98    Lipid Panel    Component Value Date/Time   CHOL 122 10/02/2018 0847   CHOL 160 04/11/2018 1451   TRIG 147 10/02/2018 0847   HDL 30 (L) 10/02/2018 0847   HDL 44 04/11/2018 1451   CHOLHDL 4.1 10/02/2018 0847   VLDL 29 10/02/2018 0847   LDLCALC 63 10/02/2018 0847   LDLCALC 63 04/11/2018 1451      Wt Readings from Last 3 Encounters:  08/07/20 196 lb 6.4 oz (89.1 kg)  07/24/20 200 lb (90.7 kg)  07/14/20 200 lb (90.7 kg)        ASSESSMENT AND PLAN:  1.  S/p TAVR  for severe symptomatic aortic stenosis: Most recent echocardiogram in March 2020 showed mildly elevated gradient at 17 mmHg with mild to moderate aortic regurgitation.  He is currently New York Heart Association class II. Given recent episodes of chest pain, I requested an echocardiogram to evaluate his aortic valve and also his ejection fraction.  2. Coronary artery disease involving graft bypass with worsening angina: He had improvement after increasing Imdur but now reports recurrent symptoms. I requested Lexiscan Myoview.  3.  Chronic systolic heart failure: Most recent ejection  fraction was 25 to 30% but since then he underwent CRT with improvement in symptoms. His blood pressure improved after decreasing the dose of Entresto. Continue this with carvedilol. Check echocardiogram.  4. Essential hypertension: Blood pressure is controlled.  5. Peripheral arterial disease: No recurrent claudication since bilateral iliac stenting.   6. Hyperlipidemia: Continue high-dose atorvastatin .  Most recent lipid profile in May showed a triglyceride of 168 and an LDL of 82.  Previously, his LDL was less than 70.  Continue same treatment for now and we can consider adding Zetia if LDL remains above 70.  Disposition:   FU with me in 1 months  Signed,  [name], MD  08/07/2020 4:02 PM    [facility]

## 2020-08-07 NOTE — Patient Instructions (Signed)
Medication Instructions:  Your physician recommends that you continue on your current medications as directed. Please refer to the Current Medication list given to you today.  *If you need a refill on your cardiac medications before your next appointment, please call your pharmacy*   Lab Work: None ordered If you have labs (blood work) drawn today and your tests are completely normal, you will receive your results only by: Marland Kitchen MyChart Message (if you have MyChart) OR . A paper copy in the mail If you have any lab test that is abnormal or we need to change your treatment, we will call you to review the results.   Testing/Procedures: Your physician has requested that you have a lexiscan myoview. For further information please visit [email or web address]. Please follow instruction sheet, as given.  Your physician has requested that you have an echocardiogram. Echocardiography is a painless test that uses sound waves to create images of your heart. It provides your doctor with information about the size and shape of your heart and how well your heart's chambers and valves are working. This procedure takes approximately one hour. There are no restrictions for this procedure.    Follow-Up: At [facility], you and your health needs are our priority.  As part of our continuing mission to provide you with exceptional heart care, we have created designated Provider Care Teams.  These Care Teams include your primary Cardiologist (physician) and Advanced Practice Providers (APPs -  Physician Assistants and Nurse Practitioners) who all work together to provide you with the care you need, when you need it.  We recommend signing up for the patient portal called "MyChart".  Sign up information is provided on this After Visit Summary.  MyChart is used to connect with patients for Virtual Visits (Telemedicine).  Patients are able to view lab/test results, encounter notes, upcoming appointments, etc.  Non-urgent  messages can be sent to your provider as well.   To learn more about what you can do with MyChart, go to [email or web address].    Your next appointment:   4 week(s)  The format for your next appointment:   In Person  Provider:    You may see [name], MD or one of the following Advanced Practice Providers on your designated Care Team:    Murray Hodgkins, NP  Christell Faith, PA-C  Marrianne Mood, PA-C    Other Instructions [place]  Your caregiver has ordered a Stress Test with nuclear imaging. The purpose of this test is to evaluate the blood supply to your heart muscle. This procedure is referred to as a "Non-Invasive Stress Test." This is because other than having an IV started in your vein, nothing is inserted or "invades" your body. Cardiac stress tests are done to find areas of poor blood flow to the heart by determining the extent of coronary artery disease (CAD). Some patients exercise on a treadmill, which naturally increases the blood flow to your heart, while others who are  unable to walk on a treadmill due to physical limitations have a pharmacologic/chemical stress agent called Lexiscan . This medicine will mimic walking on a treadmill by temporarily increasing your coronary blood flow.   Please note: these test may take anywhere between 2-4 hours to complete  PLEASE REPORT TO [facility] AT THE FIRST DESK WILL DIRECT YOU WHERE TO GO  Date of Procedure:_____________________________________  Arrival Time for Procedure:______________________________  Instructions regarding medication:     __X__:  Hold betablocker(Carvedilol) night before  procedure and morning of procedure   PLEASE NOTIFY THE OFFICE AT LEAST 24 HOURS IN ADVANCE IF YOU ARE UNABLE TO KEEP YOUR APPOINTMENT.  [phone] AND  PLEASE NOTIFY NUCLEAR MEDICINE AT [facility] AT LEAST 24 HOURS IN ADVANCE IF YOU ARE UNABLE TO KEEP YOUR APPOINTMENT. [phone]  How  to prepare for your Myoview test:  1. Do not eat or drink after midnight 2. No caffeine for 24 hours prior to test 3. No smoking 24 hours prior to test. 4. Your medication may be taken with water.  If your doctor stopped a medication because of this test, do not take that medication. 5. Ladies, please do not wear dresses.  Skirts or pants are appropriate. Please wear a short sleeve shirt. 6. No perfume, cologne or lotion. 7. Wear comfortable walking shoes. No heels!

## 2020-08-11 ENCOUNTER — Ambulatory Visit: Payer: Medicare Other | Admitting: Pain Medicine

## 2020-08-12 ENCOUNTER — Ambulatory Visit: Payer: Medicare Other | Admitting: Pain Medicine

## 2020-08-19 ENCOUNTER — Other Ambulatory Visit: Payer: Self-pay

## 2020-08-19 ENCOUNTER — Encounter
Admission: RE | Admit: 2020-08-19 | Discharge: 2020-08-19 | Disposition: A | Discharge status: Home / Self Care | Payer: Medicare Other | Source: Ambulatory Visit | Attending: Cardiovascular Disease | Admitting: Cardiovascular Disease

## 2020-08-19 DIAGNOSIS — R079 Chest pain, unspecified: Secondary | ICD-10-CM | POA: Diagnosis not present

## 2020-08-19 DIAGNOSIS — I2510 Atherosclerotic heart disease of native coronary artery without angina pectoris: Secondary | ICD-10-CM | POA: Diagnosis present

## 2020-08-19 MED ORDER — REGADENOSON 0.4 MG/5ML IV SOLN
0.4000 mg | Freq: Once | INTRAVENOUS | Status: AC
  Administered 2020-08-19: 0.4 mg via INTRAVENOUS

## 2020-08-19 MED ORDER — TECHNETIUM TC 99M TETROFOSMIN IV KIT
10.4600 | PACK | Freq: Once | INTRAVENOUS | Status: AC | PRN
  Administered 2020-08-19: 10.46 via INTRAVENOUS

## 2020-08-19 MED ORDER — TECHNETIUM TC 99M TETROFOSMIN IV KIT
30.8220 | PACK | Freq: Once | INTRAVENOUS | Status: AC | PRN
  Administered 2020-08-19: 30.822 via INTRAVENOUS

## 2020-08-20 LAB — NM MYOCAR MULTI W/SPECT W/WALL MOTION / EF
Estimated workload: 1 METS
Exercise duration (min): 0 min
Exercise duration (sec): 0 s
LV dias vol: 212 mL (ref 62–150)
LV sys vol: 122 mL
MPHR: 139 {beats}/min
Peak HR: 86 {beats}/min
Percent HR: 61 %
Rest HR: 71 {beats}/min
TID: 1.15

## 2020-08-25 DIAGNOSIS — I25118 Atherosclerotic heart disease of native coronary artery with other forms of angina pectoris: Secondary | ICD-10-CM

## 2020-08-27 ENCOUNTER — Telehealth: Payer: Self-pay

## 2020-08-27 NOTE — Telephone Encounter (Signed)
Attempted to call- no answer

## 2020-08-28 ENCOUNTER — Ambulatory Visit: Payer: Medicare Other | Attending: Pain Medicine | Admitting: Pain Medicine

## 2020-08-28 ENCOUNTER — Other Ambulatory Visit: Payer: Self-pay

## 2020-08-28 ENCOUNTER — Telehealth: Payer: Self-pay

## 2020-08-28 DIAGNOSIS — M47898 Other spondylosis, sacral and sacrococcygeal region: Secondary | ICD-10-CM

## 2020-08-28 DIAGNOSIS — M47817 Spondylosis without myelopathy or radiculopathy, lumbosacral region: Secondary | ICD-10-CM

## 2020-08-28 DIAGNOSIS — M5442 Lumbago with sciatica, left side: Secondary | ICD-10-CM

## 2020-08-28 DIAGNOSIS — M533 Sacrococcygeal disorders, not elsewhere classified: Secondary | ICD-10-CM

## 2020-08-28 DIAGNOSIS — M47816 Spondylosis without myelopathy or radiculopathy, lumbar region: Secondary | ICD-10-CM

## 2020-08-28 DIAGNOSIS — M5441 Lumbago with sciatica, right side: Secondary | ICD-10-CM

## 2020-08-28 DIAGNOSIS — G8929 Other chronic pain: Secondary | ICD-10-CM

## 2020-08-28 NOTE — Telephone Encounter (Signed)
Called both numbers and kleft messages to call office for pre virtual appointment questions.

## 2020-08-28 NOTE — Progress Notes (Signed)
Patient: Matthew [name]  Service Category: E/M  Provider: Gaspar Cola, MD  DOB: 03-15-1936  DOS: 08/28/2020  Location: Office  MRN: [id number]  Setting: Ambulatory outpatient  Referring Provider: Antionette Char, MD  Type: Established Patient  Specialty: Interventional Pain Management  PCP: Antionette Char, MD  Location: Remote location  Delivery: TeleHealth     Virtual Encounter - Pain Management PROVIDER NOTE: Information contained herein reflects review and annotations entered in association with encounter. Interpretation of such information and data should be left to medically-trained personnel. Information provided to patient can be located elsewhere in the medical record under "Patient Instructions". Document created using STT-dictation technology, any transcriptional errors that may result from process are unintentional.    Contact & Pharmacy Preferred: [phone] Home: [phone] (home) Mobile: [phone] (mobile) E-mail: [email or web address]  [place], [place] [address] Phone: [phone] Fax: [phone]  [place], [place], [address] [place], [place] [address] [place] [address] Phone: [phone] Fax: [phone]   Pre-screening  Mr. Troiani offered "in-person" vs "virtual" encounter. He indicated preferring virtual for this encounter.   Reason COVID-19*  Social distancing based on CDC and AMA recommendations.   I contacted JACEK COLSON on 08/28/2020 via telephone.      I clearly identified myself as Gaspar Cola, MD. I verified that I was speaking with the correct person using two identifiers (Name: MIKA ANASTASI, and date of birth: 11-18-1936).  Consent I sought verbal advanced consent from [name] for virtual visit interactions. I informed Mr. Laura of possible security and privacy concerns, risks, and limitations  associated with providing "not-in-person" medical evaluation and management services. I also informed Mr. Jedlicka of the availability of "in-person" appointments. Finally, I informed him that there would be a charge for the virtual visit and that he could be  personally, fully or partially, financially responsible for it. Mr. Strey expressed understanding and agreed to proceed.   Historic Elements   Mr. AXAVIER PRESSLEY is a 84 y.o. year old, male patient evaluated today after our last contact on 07/25/2020. Mr. Obryant  has a past medical history of Alcohol abuse, Anemia, AS (aortic stenosis), CAD (coronary artery disease), Carotid bruit, Chest pain, GERD (gastroesophageal reflux disease), History of kidney stones, HOH (hard of hearing), HTN (hypertension), Hyperlipidemia, Myocardial infarction ([place]) (1996), Nephrolithiasis, OSA (obstructive sleep apnea), PAD (peripheral artery disease) ([place]) (02/2014), Shortness of breath, and Thrombocytopenia ([place]). He also  has a past surgical history that includes Stomach surgery; Coronary artery bypass graft (1991); Inguinal hernia repair (11/10/2011); left heart catheterization with coronary/graft angiogram (N/A, 03/06/2014); abdominal aortagram (N/A, 03/06/2014); Cardiac catheterization (02/2014); Cardiac catheterization (N/A, 07/31/2015); Cardiac catheterization (N/A, 07/31/2015); Cardiac catheterization (N/A, 05/26/2016); Cysto; Transcatheter aortic valve replacement, transfemoral (N/A, 06/01/2016); TEE without cardioversion (N/A, 06/01/2016); LEFT HEART CATH AND CORONARY ANGIOGRAPHY (Left, 05/15/2018); Esophagogastroduodenoscopy (egd) with propofol (N/A, 10/07/2018); Colonoscopy with propofol (N/A, 10/07/2018); Givens capsule study (N/A, 12/22/2018); enteroscopy (N/A, 12/25/2018); Colonoscopy with propofol (N/A, 01/08/2019); Laparoscopic right colectomy (Right, 02/09/2019); and BIV PACEMAKER INSERTION CRT-P (N/A, 07/23/2019). Mr. Wagster has a current medication list which includes the following  prescription(s): acetaminophen, aspirin ec, atorvastatin, carvedilol, cyanocobalamin, ferrous sulfate, isosorbide mononitrate, multiple vitamins-minerals, omega-3 fatty acids, pantoprazole, entresto, tamsulosin, tetrahydrozoline, and turmeric, and the following Facility-Administered Medications: cyanocobalamin. He  reports that he quit smoking about 30 years ago. His smoking use included cigarettes. He has  a 50.00 pack-year smoking history. He has never used smokeless tobacco. He reports that he does not drink alcohol and does not use drugs. Mr. Feldner is allergic to ranexa [ranolazine er].   HPI  Today, he is being contacted for a post-procedure assessment.  Post-Procedure Evaluation  Procedure (07/25/2020): Diagnostic bilateral lumbar facet block #3 + diagnostic bilateral sacroiliac joint block #3 under fluoroscopic guidance, no sedation Pre-procedure pain level: 4/10 Post-procedure: 0/10 (100% relief)  Sedation: None.  Effectiveness during initial hour after procedure(Ultra-Short Term Relief): 100 %.  Local anesthetic used: Long-acting (4-6 hours) Effectiveness: Defined as any analgesic benefit obtained secondary to the administration of local anesthetics. This carries significant diagnostic value as to the etiological location, or anatomical origin, of the pain. Duration of benefit is expected to coincide with the duration of the local anesthetic used.  Effectiveness during initial 4-6 hours after procedure(Short-Term Relief): 100 %.  Long-term benefit: Defined as any relief past the pharmacologic duration of the local anesthetics.  Effectiveness past the initial 6 hours after procedure(Long-Term Relief): 75 % (ongoing).  Current benefits: Defined as benefit that persist at this time.   Analgesia:  >75% relief Function: Mr. Kraeger reports improvement in function ROM: Mr. Delashmit reports improvement in ROM  Pharmacotherapy Assessment  Analgesic: Tramadol 50 mg 1 tablet by mouth every 8 hours (150  mg/day of tramadol) (15 MME/day) MME/day: 15 mg/day.   Monitoring: [place] PMP: PDMP reviewed during this encounter.       Pharmacotherapy: No side-effects or adverse reactions reported. Compliance: No problems identified. Effectiveness: Clinically acceptable. Plan: Refer to "POC".  UDS:  Summary  Date Value Ref Range Status  08/31/2017 FINAL  Final    Comment:    ==================================================================== [facility] COMP DRUG ANALYSIS,UR ==================================================================== Test                             Result       Flag       Units Drug Present and Declared for Prescription Verification   Acetaminophen                  PRESENT      EXPECTED Drug Present not Declared for Prescription Verification   Tramadol                       >5102        UNEXPECTED ng/mg creat   O-Desmethyltramadol            5066         UNEXPECTED ng/mg creat   N-Desmethyltramadol            1382         UNEXPECTED ng/mg creat    Source of tramadol is a prescription medication.    O-desmethyltramadol and N-desmethyltramadol are expected    metabolites of tramadol. Drug Absent but Declared for Prescription Verification   Salicylate                     Not Detected UNEXPECTED    Aspirin, as indicated in the declared medication list, is not    always detected even when used as directed.   Metoprolol                     Not Detected UNEXPECTED ==================================================================== Test  Result    Flag   Units      Ref Range   Creatinine              98               mg/dL      >=20 ==================================================================== Declared Medications:  The flagging and interpretation on this report are based on the  following declared medications.  Unexpected results may arise from  inaccuracies in the declared medications.  **Note: The testing scope of this panel includes these  medications:  Metoprolol  **Note: The testing scope of this panel does not include small to  moderate amounts of these reported medications:  Acetaminophen  Aspirin  **Note: The testing scope of this panel does not include following  reported medications:  Atorvastatin  Clopidogrel  Hydrochlorothiazide (HCTZ)  Isosorbide Mononitrate  Lisinopril  Multivitamin  Nitroglycerin  Pantoprazole  Tamsulosin ==================================================================== For clinical consultation, please call [phone]. ====================================================================     Laboratory Chemistry Profile   Renal Lab Results  Component Value Date   BUN 14 07/12/2019   CREATININE 1.09 07/12/2019   BCR 13 07/12/2019   GFRAA 74 07/12/2019   GFRNONAA 64 07/12/2019     Hepatic Lab Results  Component Value Date   AST 17 01/17/2019   ALT 17 01/17/2019   ALBUMIN 4.5 01/17/2019   ALKPHOS 64 01/17/2019     Electrolytes Lab Results  Component Value Date   NA 141 07/12/2019   K 3.9 07/12/2019   CL 104 07/12/2019   CALCIUM 9.3 07/12/2019   MG 2.0 02/10/2019   PHOS 1.7 (L) 02/10/2019     Bone Lab Results  Component Value Date   25OHVITD1 31 08/31/2017   25OHVITD2 <1.0 08/31/2017   25OHVITD3 31 08/31/2017     Inflammation (CRP: Acute Phase) (ESR: Chronic Phase) Lab Results  Component Value Date   CRP 0.4 08/31/2017   ESRSEDRATE 3 08/31/2017       Note: Above Lab results reviewed.  Imaging  NM Myocar Multi W/Spect W/Wall Motion / EF  Abnormal pharmacologic myocardial perfusion stress test.  There is a moderate in size, severe, fixed defect invovling the inferior  wall consistent with scar.  There is a small in size, moderate in severity, partially reversible  defect involving the mid inferolateral and apical lateral segments most  likely representing scar with periinfarct ischemia but cannot rule out an  element of artifact.  The left  ventricular ejection fraction is severely decreased (<30%).  Attenuation correction CT is notable for post-CABG and TAVR findings as  well as RV pacing wire and aortic atherosclerosis.  Compared with the prior study from 04/14/2018, inferolateral defect is  more pronounced. LVEF has also declined further.  This is a high risk study.  EKG suggests underlying atrial fibrillation with ventricular pacing.    Assessment  The primary encounter diagnosis was Lumbar facet syndrome (Bilateral) (L>R). Diagnoses of Spondylosis without myelopathy or radiculopathy, lumbosacral region, Chronic sacroiliac joint pain (Bilateral) (L>R), Other spondylosis, sacral and sacrococcygeal region, and Chronic low back pain (Primary Area of Pain) (Bilateral) (L>R) were also pertinent to this visit.  Plan of Care  Problem-specific:  No problem-specific Assessment & Plan notes found for this encounter.  Mr. ADOLPH CLUTTER has a current medication list which includes the following long-term medication(s): atorvastatin, carvedilol, ferrous sulfate, isosorbide mononitrate, and pantoprazole.  Pharmacotherapy (Medications Ordered): No orders of the defined types were placed in this encounter.  Orders:  No orders  of the defined types were placed in this encounter.  Follow-up plan:   Return if symptoms worsen or fail to improve.      Interventional management options: Planned, scheduled, and/or pending:  NOTE:Stop Plavix for7 days forprocedures.    Considering:   Possible bilateral lumbar facet RFA #1 Possible bilateral SI joint RFA #1 Diagnostic left L2 TFESI Diagnostic bilateral L3 TFESI Diagnostic bilateral hip injections   Palliative PRN treatment(s):   Diagnostic bilateral lumbar facet block #3  Diagnostic bilateral SI joint block #3  Therapeutic/palliative left L4-5 LESI #3      Recent Visits Date Type Provider Dept  07/24/20 Procedure visit Milinda Pointer, MD [facility]   07/14/20 Office Visit Milinda Pointer, MD [facility]  Showing recent visits within past 90 days and meeting all other requirements Today's Visits Date Type Provider Dept  08/28/20 Telemedicine Milinda Pointer, MD [facility]  Showing today's visits and meeting all other requirements Future Appointments No visits were found meeting these conditions. Showing future appointments within next 90 days and meeting all other requirements  I discussed the assessment and treatment plan with the patient. The patient was provided an opportunity to ask questions and all were answered. The patient agreed with the plan and demonstrated an understanding of the instructions.  Patient advised to call back or seek an in-person evaluation if the symptoms or condition worsens.  Duration of encounter: 12 minutes.  Note by: Gaspar Cola, MD Date: 08/28/2020; Time: 10:28 AM

## 2020-08-28 NOTE — Telephone Encounter (Signed)
LM for patient to call office for virtual appointment questions.

## 2020-09-01 ENCOUNTER — Ambulatory Visit: Payer: Medicare Other | Admitting: Pain Medicine

## 2020-09-02 ENCOUNTER — Other Ambulatory Visit: Payer: Self-pay

## 2020-09-02 ENCOUNTER — Ambulatory Visit (INDEPENDENT_AMBULATORY_CARE_PROVIDER_SITE_OTHER): Payer: Medicare Other

## 2020-09-02 DIAGNOSIS — I359 Nonrheumatic aortic valve disorder, unspecified: Secondary | ICD-10-CM

## 2020-09-02 DIAGNOSIS — I5022 Chronic systolic (congestive) heart failure: Secondary | ICD-10-CM

## 2020-09-02 LAB — ECHOCARDIOGRAM COMPLETE
AR max vel: 0.78 cm2
AV Area VTI: 0.91 cm2
AV Area mean vel: 0.75 cm2
AV Mean grad: 14 mmHg
AV Peak grad: 21.8 mmHg
Ao pk vel: 2.34 m/s
Area-P 1/2: 2.83 cm2
S' Lateral: 4.4 cm

## 2020-09-03 ENCOUNTER — Other Ambulatory Visit
Admission: RE | Admit: 2020-09-03 | Discharge: 2020-09-03 | Disposition: A | Discharge status: Home / Self Care | Payer: Medicare Other | Source: Ambulatory Visit | Attending: Cardiovascular Disease | Admitting: Cardiovascular Disease

## 2020-09-03 ENCOUNTER — Other Ambulatory Visit
Admission: RE | Admit: 2020-09-03 | Discharge: 2020-09-03 | Disposition: A | Discharge status: Home / Self Care | Payer: Medicare Other | Source: Home / Self Care | Attending: Cardiovascular Disease | Admitting: Cardiovascular Disease

## 2020-09-03 ENCOUNTER — Telehealth: Payer: Self-pay

## 2020-09-03 DIAGNOSIS — I25118 Atherosclerotic heart disease of native coronary artery with other forms of angina pectoris: Secondary | ICD-10-CM | POA: Insufficient documentation

## 2020-09-03 DIAGNOSIS — Z20822 Contact with and (suspected) exposure to covid-19: Secondary | ICD-10-CM | POA: Diagnosis not present

## 2020-09-03 LAB — BASIC METABOLIC PANEL
Anion gap: 8 (ref 5–15)
BUN: 11 mg/dL (ref 8–23)
CO2: 25 mmol/L (ref 22–32)
Calcium: 9.6 mg/dL (ref 8.9–10.3)
Chloride: 105 mmol/L (ref 98–111)
Creatinine, Ser: 0.99 mg/dL (ref 0.61–1.24)
GFR calc Af Amer: >60 mL/min (ref >60)
GFR calc non Af Amer: >60 mL/min (ref >60)
Glucose, Bld: 126 mg/dL — ABNORMAL HIGH (ref 70–99)
Potassium: 3.4 mmol/L — ABNORMAL LOW (ref 3.5–5.1)
Sodium: 138 mmol/L (ref 135–145)

## 2020-09-03 LAB — CBC WITH DIFFERENTIAL/PLATELET
Abs Immature Granulocytes: 0.05 10*3/uL (ref 0.00–0.07)
Basophils Absolute: 0.1 10*3/uL (ref 0.0–0.1)
Basophils Relative: 1 %
Eosinophils Absolute: 0.1 10*3/uL (ref 0.0–0.5)
Eosinophils Relative: 1 %
HCT: 38.6 % — ABNORMAL LOW (ref 39.0–52.0)
Hemoglobin: 13.5 g/dL (ref 13.0–17.0)
Immature Granulocytes: 1 %
Lymphocytes Relative: 22 %
Lymphs Abs: 1.5 10*3/uL (ref 0.7–4.0)
MCH: 31.2 pg (ref 26.0–34.0)
MCHC: 35 g/dL (ref 30.0–36.0)
MCV: 89.1 fL (ref 80.0–100.0)
Monocytes Absolute: 0.6 10*3/uL (ref 0.1–1.0)
Monocytes Relative: 9 %
Neutro Abs: 4.5 10*3/uL (ref 1.7–7.7)
Neutrophils Relative %: 66 %
Platelets: 108 10*3/uL — ABNORMAL LOW (ref 150–400)
RBC: 4.33 MIL/uL (ref 4.22–5.81)
RDW: 13.3 % (ref 11.5–15.5)
WBC: 6.9 10*3/uL (ref 4.0–10.5)
nRBC: 0 % (ref 0.0–0.2)

## 2020-09-03 MED ORDER — POTASSIUM CHLORIDE CRYS ER 20 MEQ PO TBCR
20.0000 meq | EXTENDED_RELEASE_TABLET | Freq: Every day | ORAL | 2 refills | Status: DC
Start: 2020-09-03 — End: 2021-05-26

## 2020-09-03 NOTE — Telephone Encounter (Signed)
Patients daughter Matthew [name] made aware of echo and lab results with verbalized understanding.  Rx for potassium 20 meq has been sent to the patients pharmacy.  Patient is scheduled for a cardiac cath on 09/05/20 with Dr. Fletcher Anon.  Matthew Hampshire, MD  Matthew [name], Matthew [place], RN Inform patient that echo was fine.Ejection fraction improved to 40 to 45% with normal functioning aortic valve prosthesis.

## 2020-09-03 NOTE — Progress Notes (Signed)
Cardiology Office Note    Date:  09/10/2020   ID:  Matthew [name], DOB Jul 14, 1936, MRN [id number]  PCP:  Antionette Char, MD  Cardiologist:  [name], MD  Electrophysiologist:  Virl Axe, MD   Chief Complaint: Follow up  History of Present Illness:   Matthew [name] is a 84 y.o. male with history of CAD s/p CABG in 1991 with redo bypass in 2004, HFrEF secondary to ICM s/p CRT-P in 07/2019, aortic stenosis s/p TAVR in 05/2016 with post procedure LBBB, PAD s/p bilateral common iliac artery kissing stents in 2015, PVCs, HTN, HLD, and GI bleeding in 09/2018 gastric AVMs s/p therapy with acute blood loss anemia who presents for follow-up of recent diagnostic R/LHC.  R/LHC in 05/2016, prior to his TAVR, showed severe underlying 3-vessel CAD with patent grafts (proximal LAD 100% stenosed, mid LCx 100% stenosed, OM2 80% stenosed, proximal RCA 100% stenosed, LIMA-LAD was unable to be cannulated and was anatomically normal and normal in caliber, RIMA-D2 was visualized by non-selective angiography and was anatomically normal, VG-OM2 with a patent, previously placed proximal graft stent, 50% mid VG-OM2 stenosis, VG-OM3 minimal luminal irregularities, VG-RPDA mild diffuse disease), RHC showed high normal pulmonary pressure and filling pressure, normal cardiac output. Following this, he underwent TAVR in 05/2016. Post procedure, he developed a LBBB coupled with a gradual decline in his EF. Pre-TAVR, his EF was 55-60% with follow up study shortly after his procedure showing an EF of 55%. In 2018, his EF was noted to be 45-50% then dropped to 30-35% in 2019. Cardiac cath in 2019, performed for worsening angina, showed significant underlying three-vessel CAD with patent LIMA to LAD, patent RIMA to diagonal, patent SVG to OM3 and patent SVG to rPDA. The SVG to OM2 was found to be occluded. However, OM2 had retrograde flow from SVG to OM3. There was possible left subclavian artery stenosis but it was not  significant by gradient. This was also interrogated by carotid Doppler and there was nothing to suggest significant left subclavian stenosis. Echo in 02/2019, showed an EF of 25-30%, mild aortic stenosis with a mean gradient of 17 mmHg and mild to moderate aortic insufficiency. With continued decline in his LVSF, with an underlying LBBB, he underwent CRT-P implantation in 07/2019.   He was seen by Dr. Fletcher Anon on 08/07/2020 and noted worsening episodes of substernal chest pain and tightness occurring with exertion and at rest as well as two episodes of presyncope while mowing the lawn. Echo on 09/02/2020 showed an improved LVSF with an EF of 40-45%, mildly dilated LV cavity size, moderate LVH, Gr1DD, elevated left atrial pressure, moderately reduced RVSF with normal RV cavity size, trivial mitral regurgitation, normal size and function of AVR. He underwent diagnostic cath on 09/05/2020 that showed significant underlying 3 vessel CAD with patent grafts including LIMA to LAD, RIMA to diagonal, SVG RCA, and SVG to OM 3.  There was no significant change in his coronary anatomy when compared to prior cardiac cath.  He was noted to have native small vessel CAD which was felt to possibly be the culprit for his stable anginal symptoms.  RHC showed normal filling pressures, normal pulmonary pressure, and normal cardiac output.  Continued medical therapy was recommended.  He comes in accompanied by his daughter today and is doing well from a cardiac perspective.  Since he was last seen has not had any further chest discomfort.  His exertional shortness of breath is stable to improved.  Earlier this week  he has used his 0 turn mower to [employer] the lawn without issues, split firewood with an automated wood splitter, though did left and move the 1 on his own.  With this, he did notice some exertional shortness of breath that improved with resting for approximately 1 to 2 minutes.  He has also independently removed the wheels from his  truck to have them repaired.  Blood pressure at home typically runs in the 099I systolic.  He does note some positional dizziness when he bends over.  No falls.  He is tolerating all medications without issues.  No issues from his cardiac cath site.  Overall, he is feeling well.   Labs independently reviewed: 08/2020 - Hgb 13.5, PLT 108, potassium 3.5, BUN 11, serum creatinine 0.99 01/2019 - albumin 4.5, AST/ALT normal, magnesium 2.0 09/2018 - TC 122, TG 147, HDL 30, LDL 63, TSH normal  Past Medical History:  Diagnosis Date  . Alcohol abuse    stopped drinking 42 years ago from today 2020  . Anemia   . AS (aortic stenosis)    a. Echo 6/10: EF 55% mild AS; b. echo 06/2015; EF 55-60%, GR1DD, moderate AS, Peak velocity (S): 346 cm/s. Mean gradientS): 28 mm Hg. Peak gradient (S): 48 mm Hg. Valve area (VTI): 1.18 cm2;   c. Echo 4/17 - mild LVH, EF 55-60%, no RWMA, Gr 1 DD, mod to severe AS (mean 28 mmHg, peak 46 mmHg), mild LAE  . CAD (coronary artery disease)    a. MI 1996 w/ CABG 1996; b.redo CABG 06/2003; c. Myoview 7/09: EF 54% inferobasal infarct, no ischemia. Myoview 6/10 EF 43% inf wall infarct. no ischemia; c. cath 8/16 s/p DES to VG-OM2. OTW 3VD w/ patent VG->RPDA, VG->OM3,  & LIMA->LAD.  EF 55-65%.  . Carotid bruit    2009 0-39% on dopplers bilatrally  . Chest pain   . GERD (gastroesophageal reflux disease)   . History of kidney stones   . HOH (hard of hearing)   . HTN (hypertension)   . Hyperlipidemia   . Myocardial infarction ([place]) 1996  . Nephrolithiasis   . OSA (obstructive sleep apnea)   . PAD (peripheral artery disease) ([place]) 02/2014   Subtotal occlusion of right common iliac artery and 70% stenosis in the left common iliac artery. Status post bilateral kissing stent placement. Significant post stenosis aneurysmal dilatation on the right side (any future catheterization through the right femoral artery should be done cautiously to avoid advancing the wire behind the stent struts)    . Shortness of breath    with exertion  . Thrombocytopenia ([place])     Past Surgical History:  Procedure Laterality Date  . ABDOMINAL AORTAGRAM N/A 03/06/2014   Procedure: ABDOMINAL Maxcine Ham;  Surgeon: Wellington Hampshire, MD;  Location: [place] CATH LAB;  Service: Cardiovascular;  Laterality: N/A;  . BIV PACEMAKER INSERTION CRT-P N/A 07/23/2019   Procedure: BIV PACEMAKER INSERTION CRT-P;  Surgeon: Deboraha Sprang, MD;  Location: [place] CV LAB;  Service: Cardiovascular;  Laterality: N/A;  . CARDIAC CATHETERIZATION  02/2014   Severe three-vessel coronary artery disease with patent grafts.   Marland Kitchen CARDIAC CATHETERIZATION N/A 07/31/2015   Procedure: Right/Left Heart Cath and Coronary/Graft Angiography;  Surgeon: Wellington Hampshire, MD;  Location: [place] CV LAB;  Service: Cardiovascular;  Laterality: N/A;  . CARDIAC CATHETERIZATION N/A 07/31/2015   Procedure: Coronary Stent Intervention;  Surgeon: Wellington Hampshire, MD;  Location: [place] CV LAB;  Service: Cardiovascular;  Laterality: N/A;  .  CARDIAC CATHETERIZATION N/A 05/26/2016   Procedure: Right/Left Heart Cath and Coronary Angiography;  Surgeon: Wellington Hampshire, MD;  Location: [place] CV LAB;  Service: Cardiovascular;  Laterality: N/A;  . COLONOSCOPY WITH PROPOFOL N/A 10/07/2018   Procedure: COLONOSCOPY WITH PROPOFOL;  Surgeon: Lin Landsman, MD;  Location: [facility] ENDOSCOPY;  Service: Gastroenterology;  Laterality: N/A;  . COLONOSCOPY WITH PROPOFOL N/A 01/08/2019   Procedure: COLONOSCOPY WITH PROPOFOL;  Surgeon: Lin Landsman, MD;  Location: [facility] ENDOSCOPY;  Service: Gastroenterology;  Laterality: N/A;  . CORONARY ARTERY BYPASS GRAFT  1991   at [facility] Redone 2004-3 vessels 1st time and 4 second time  . CYSTO     2/3 times for kidney stones  . ENTEROSCOPY N/A 12/25/2018   Procedure: ENTEROSCOPY Single balloon;  Surgeon: Lin Landsman, MD;  Location: [place];  Service: Gastroenterology;  Laterality: N/A;  .  ESOPHAGOGASTRODUODENOSCOPY (EGD) WITH PROPOFOL N/A 10/07/2018   Procedure: ESOPHAGOGASTRODUODENOSCOPY (EGD) WITH PROPOFOL;  Surgeon: Lin Landsman, MD;  Location: [facility] ENDOSCOPY;  Service: Gastroenterology;  Laterality: N/A;  . GIVENS CAPSULE STUDY N/A 12/22/2018   Procedure: GIVENS CAPSULE STUDY;  Surgeon: Lin Landsman, MD;  Location: [facility] ENDOSCOPY;  Service: Gastroenterology;  Laterality: N/A;  . INGUINAL HERNIA REPAIR  11/10/2011   Procedure: HERNIA REPAIR INGUINAL ADULT;  Surgeon: Donato Heinz;  Location: [facility] ORS;  Service: General;  Laterality: Right;  . LAPAROSCOPIC RIGHT COLECTOMY Right 02/09/2019   Procedure: LAPAROSCOPIC RIGHT COLECTOMY;  Surgeon: Jules Husbands, MD;  Location: [facility] ORS;  Service: General;  Laterality: Right;  . LEFT HEART CATH AND CORONARY ANGIOGRAPHY Left 05/15/2018   Procedure: LEFT HEART CATH AND CORONARY ANGIOGRAPHY;  Surgeon: Wellington Hampshire, MD;  Location: [place] CV LAB;  Service: Cardiovascular;  Laterality: Left;  . LEFT HEART CATHETERIZATION WITH CORONARY/GRAFT ANGIOGRAM N/A 03/06/2014   Procedure: LEFT HEART CATHETERIZATION WITH Beatrix Fetters;  Surgeon: Wellington Hampshire, MD;  Location: [place] CATH LAB;  Service: Cardiovascular;  Laterality: N/A;  . RIGHT HEART CATH AND CORONARY/GRAFT ANGIOGRAPHY N/A 09/05/2020   Procedure: RIGHT HEART CATH AND CORONARY/GRAFT ANGIOGRAPHY;  Surgeon: Wellington Hampshire, MD;  Location: [place] CV LAB;  Service: Cardiovascular;  Laterality: N/A;  . STOMACH SURGERY     removal of gastric ulcers  . TEE WITHOUT CARDIOVERSION N/A 06/01/2016   Procedure: TRANSESOPHAGEAL ECHOCARDIOGRAM (TEE);  Surgeon: Sherren Mocha, MD;  Location: [place];  Service: Open Heart Surgery;  Laterality: N/A;  . TRANSCATHETER AORTIC VALVE REPLACEMENT, TRANSFEMORAL N/A 06/01/2016   Procedure: TRANSCATHETER AORTIC VALVE REPLACEMENT, TRANSFEMORAL;  Surgeon: Sherren Mocha, MD;  Location: [place];  Service: Open Heart Surgery;  Laterality:  N/A;    Current Medications: Current Meds  Medication Sig  . acetaminophen (TYLENOL) 325 MG tablet Take 650 mg by mouth every 6 (six) hours as needed for mild pain or headache.   Marland Kitchen aspirin EC 81 MG tablet Take 1 tablet (81 mg total) by mouth daily.  Marland Kitchen atorvastatin (LIPITOR) 80 MG tablet Take 1 tablet (80 mg total) by mouth daily at 6 PM.  . carvedilol (COREG) 6.25 MG tablet TAKE 1 TABLET BY MOUTH  TWICE DAILY (Patient taking differently: Take 3.125 mg by mouth in the morning and at bedtime. )  . isosorbide mononitrate (IMDUR) 60 MG 24 hr tablet Take 1 tablet (60 mg total) by mouth daily.  . Multiple Vitamins-Minerals (PRESERVISION AREDS PO) Take 1 tablet by mouth daily.   . pantoprazole (PROTONIX) 40 MG tablet Take 1 tablet (40 mg total)  by mouth daily.  . potassium chloride SA (KLOR-CON M20) 20 MEQ tablet Take 1 tablet (20 mEq total) by mouth daily.  . sacubitril-valsartan (ENTRESTO) 49-51 MG Take 1 tablet by mouth 2 (two) times daily.  . tamsulosin (FLOMAX) 0.4 MG CAPS capsule Take 1 capsule (0.4 mg total) by mouth daily. Future refill request will need to be sent to the pcp  . tetrahydrozoline (VISINE) 0.05 % ophthalmic solution Place 2 drops into both eyes 4 (four) times daily as needed (for dry eyes).  . TURMERIC PO Take 1 capsule by mouth at bedtime.   . vitamin B-12 (CYANOCOBALAMIN) 1000 MCG tablet Take 1,000 mcg by mouth daily.    Allergies:   Ranexa [ranolazine er]   Social History   Socioeconomic History  . Marital status: Married    Spouse name: Not on file  . Number of children: 2  . Years of education: Not on file  . Highest education level: Not on file  Occupational History  . Occupation: Retired  Tobacco Use  . Smoking status: Former Smoker    Packs/day: 1.00    Years: 50.00    Pack years: 50.00    Types: Cigarettes    Quit date: 01/24/1990    Years since quitting: 30.6  . Smokeless tobacco: Never Used  Vaping Use  . Vaping Use: Never used  Substance and  Sexual Activity  . Alcohol use: No    Alcohol/week: 0.0 standard drinks    Comment: Alcoholic in past  . Drug use: No  . Sexual activity: Never  Other Topics Concern  . Not on file  Social History Narrative   Married an lives with wife in [place]. Retired form [employer].    Social Determinants of Health   Financial Resource Strain:   . Difficulty of Paying Living Expenses: Not on file  Food Insecurity:   . Worried About Charity fundraiser in the Last Year: Not on file  . Ran Out of Food in the Last Year: Not on file  Transportation Needs:   . Lack of Transportation (Medical): Not on file  . Lack of Transportation (Non-Medical): Not on file  Physical Activity:   . Days of Exercise per Week: Not on file  . Minutes of Exercise per Session: Not on file  Stress:   . Feeling of Stress : Not on file  Social Connections:   . Frequency of Communication with Friends and Family: Not on file  . Frequency of Social Gatherings with Friends and Family: Not on file  . Attends Religious Services: Not on file  . Active Member of Clubs or Organizations: Not on file  . Attends Archivist Meetings: Not on file  . Marital Status: Not on file     Family History:  The patient's family history includes Heart disease in his father and mother. There is no history of Colon cancer.  ROS:   Review of Systems  Constitutional: Positive for malaise/fatigue. Negative for chills, diaphoresis, fever and weight loss.  HENT: Negative for congestion.   Eyes: Negative for discharge and redness.  Respiratory: Positive for shortness of breath. Negative for cough, sputum production and wheezing.   Cardiovascular: Negative for chest pain, palpitations, orthopnea, claudication, leg swelling and PND.  Gastrointestinal: Negative for abdominal pain, blood in stool, heartburn, melena, nausea and vomiting.  Musculoskeletal: Negative for falls and myalgias.  Skin: Negative for rash.    Neurological: Positive for dizziness. Negative for tingling, tremors, sensory change, speech change, focal weakness,  loss of consciousness and weakness.  Endo/Heme/Allergies: Does not bruise/bleed easily.  Psychiatric/Behavioral: Negative for substance abuse. The patient is not nervous/anxious.   All other systems reviewed and are negative.    EKGs/Labs/Other Studies Reviewed:    Studies reviewed were summarized above. The additional studies were reviewed today:  [facility] 09/05/2020:  Prox LAD lesion is 100% stenosed.  2nd Mrg lesion is 80% stenosed.  Ost Cx lesion is 30% stenosed.  Ost Cx to Prox Cx lesion is 100% stenosed.  Mid Cx lesion is 60% stenosed.  Prox RCA lesion is 100% stenosed.  And is normal in caliber.  The graft exhibits minimal luminal irregularities.  And is normal in caliber.  The graft exhibits severe focal disease.  Origin to Prox Graft lesion is 100% stenosed.  LIMA due to inability to cannulate and is normal in caliber.  The graft exhibits no disease.  SVG.  Origin lesion before Acute Mrg is 20% stenosed.  Ost RPDA lesion is 70% stenosed.   1.  Significant underlying three-vessel coronary artery disease with patent grafts including LIMA to LAD, RIMA to diagonal, SVG to RCA and SVG to OM 3.  No significant change in coronary anatomy since most recent cardiac catheterization.  The patient does have native small vessel coronary artery disease which might be the culprit for his stable anginal symptoms. 2.  Right heart catheterization showed normal filling pressures, normal pulmonary pressure and normal cardiac output.  Recommendations: Continue medical therapy. __________  2D echo 09/02/2020: 1. Left ventricular ejection fraction, by estimation, is 40 to 45%. The  left ventricle has mildly decreased function. Left ventricular endocardial  border not optimally defined to evaluate regional wall motion. The left  ventricular internal cavity size   was mildly dilated. There is moderate left ventricular hypertrophy. Left  ventricular diastolic parameters are consistent with Grade I diastolic  dysfunction (impaired relaxation). Elevated left atrial pressure.  2. Right ventricular systolic function is moderately reduced. The right  ventricular size is normal. Tricuspid regurgitation signal is inadequate  for assessing PA pressure.  3. The mitral valve is normal in structure. Trivial mitral valve  regurgitation. No evidence of mitral stenosis.  4. The aortic valve has been repaired/replaced. Aortic valve  regurgitation is not visualized. Echo findings are consistent with normal  structure and function of the aortic valve prosthesis. Aortic valve mean  gradient measures 14.0 mmHg.  __________  2D echo 02/2019: 1. The left ventricle has severely reduced systolic function, with an  ejection fraction of 25-30%. The cavity size was moderately dilated. Left  ventricular diffuse hypokinesis. Unable to exclude regional wall motion  abnormality (challenging image  quality)  2. The right ventricle has normal systolic function. The cavity was  normal. There is no increase in right ventricular wall thickness. Right  ventricular systolic pressure is mildly elevated with an estimated  pressure of 38.0 mmHg.  3. Left atrial size was severely dilated.  4. Aortic valve regurgitation is mild to moderate. Mild stenosis of the  aortic valve. Mean gradient 17 mm Hg.  __________  2D echo 09/2018: - Left ventricle: The cavity size was mildly dilated. Wall  thickness was normal. Systolic function was moderately to  severely reduced. The estimated ejection fraction was in the  range of 30% to 35%. Wall motion was normal; there were no  regional wall motion abnormalities. Features are consistent with  a pseudonormal left ventricular filling pattern, with concomitant  abnormal relaxation and increased filling pressure (grade 2   diastolic  dysfunction).  - Aortic valve: A bioprosthesis was present and functioning  normally. s/p TAVR. Mean gradient (S): 13 mm Hg.  - Mitral valve: Calcified annulus. There was mild regurgitation.  - Left atrium: The atrium was moderately dilated.  - Pulmonary arteries: Systolic pressure could not be accurately  estimated.  __________  LHC 05/2018:  Prox LAD lesion is 100% stenosed.  2nd Mrg lesion is 80% stenosed.  Prox RCA lesion is 100% stenosed.  And is normal in caliber.  The graft exhibits minimal luminal irregularities.  And is normal in caliber.  The graft exhibits severe focal disease.  LIMA due to inability to cannulate and is normal in caliber.  The graft exhibits no disease.  Origin lesion is 40% stenosed.  Ost Cx to Prox Cx lesion is 100% stenosed.  Ost Cx lesion is 30% stenosed.  Mid Cx lesion is 60% stenosed.  Origin to Prox Graft lesion is 100% stenosed.   1.  Significant underlying three-vessel coronary artery disease with patent LIMA to LAD, patent RIMA to diagonal, patent SVG to OM 3 and patent SVG to right PDA.  The SVG to OM 2 is now occluded which is a new finding.  However, OM2 gets retrograde flow from SVG to OM 3. 2.  Possible significant left subclavian artery stenosis although no gradient was noted with pullback.  Recommendations: Overall difficult procedure due to significant tortuosity of the innominate artery and left subclavian artery.  No coronary revascularization is needed.  We have to investigate the significance of left subclavian artery stenosis which might be causing decreased flow into the LIMA to LAD.  Recommend carotid Doppler with focus on the left subclavian artery.  Endovascular intervention of the left subclavian artery is not straightforward due to tortuosity and also close origin to the LIMA. The patient is noted to have frequent PVCs before and throughout the cath.  This might be contributing to his  cardiomyopathy.  Recommend a 24-hour Holter monitor. __________  24-hour Holter 05/2018: 5600 PVCs in 24 hours representing 6% burden. Occasional PACs. Average heart rate of 62 bpm. __________  2D echo 04/2018: - Left ventricle: The cavity size was normal. Systolic function was  moderately to severely reduced. The estimated ejection fraction  was in the range of 30% to 35%. Diffuse hypokinesis. Regional  wall motion abnormalities cannot be excluded. Features are  consistent with a pseudonormal left ventricular filling pattern,  with concomitant abnormal relaxation and increased filling  pressure (grade 2 diastolic dysfunction).  - Aortic valve: A bioprosthesis was present. s/p TAVR Peak velocity  (S): 250 cm/s. Mean gradient (S): 14 mm Hg.  - Aortic root: The aortic root was mildly dilated.  - Mitral valve: There was mild regurgitation.  - Left atrium: The atrium was moderately dilated.  - Right ventricle: Systolic function was normal.  - Pulmonary arteries: Systolic pressure was within the normal  range.  __________  2D echo 05/2017: - Left ventricle: The cavity size was mildly dilated. There was  mild concentric hypertrophy. Systolic function was mildly  reduced. The estimated ejection fraction was in the range of 45%  to 50%. Doppler parameters are consistent with abnormal left  ventricular relaxation (grade 1 diastolic dysfunction).  - Ventricular septum: Septal motion showed abnormal function and  dyssynergy.  - Aortic valve: A TAVR prosthesis was present and functioning  normally. Mean gradient (S): 11 mm Hg. Peak gradient (S): 20 mm  Hg. Valve area (VTI): 1.79 cm^2.  - Left atrium: The atrium was  moderately to severely dilated.  - Right atrium: The atrium was mildly dilated.   Impressions:   - Comapred to most recent echo, the LV is mildly more dilated with  mildly reduced EF.  __________  2D echo 06/2016: - Left ventricle: LVEF is  approximately 55% with mid/distal  inferior hypokiensis; septal hypokdinesis; basal inferior  akinesis with aneurysmal dilitation. The cavity size was mildly  dilated. Wall thickness was normal. Doppler parameters are  consistent with abnormal left ventricular relaxation (grade 1  diastolic dysfunction).  - Aortic valve: AV prosthesis is well seated Peak and mean  gradients through the valve are 19 and 11 mm Hg respectively.  - Mitral valve: Calcified annulus. Mildly thickened leaflets .  - Left atrium: The atrium was severely dilated.  __________  2D echo 05/2016: - Left ventricle: Posterior basal akinesis The cavity size was  mildly dilated. Systolic function was normal. The estimated  ejection fraction was in the range of 50% to 55%. Doppler  parameters are consistent with both elevated ventricular  end-diastolic filling pressure and elevated left atrial filling  pressure.  - Aortic valve: Normal appearing 26 mm Sapien 3 valve with no  significant perivalvular regurgitation post TAVR. Valve area  (VTI): 0.81 cm^2. Valve area (Vmax): 0.83 cm^2. Valve area  (Vmean): 0.85 cm^2.  - Left atrium: The atrium was moderately dilated.  - Atrial septum: No defect or patent foramen ovale was identified. __________  TEE 05/2016: - Aortic valve: Valve area (VTI): 1.94 cm^2. Valve area (Vmax):  1.99 cm^2. Valve area (Vmean): 2.06 cm^2.  - Impressions: Pre-TAVR: EF 50-55% posterior basal akinesis. No  effusion. Mild MR. Normal RV with mild TR. No ASD/PFO. Aortic  valve trileaflet  and severely calcified. Peak velocity 4.3 m/sec, peak gradient 73  mmHg mean gradient 45 mmHg. Mild to moderate AR.    Post TAVR: Slightly high plaecement of 26 mm Sapien 3 valve.  Trivial perivalvular regurgitation at 2:00 on SA views. Peak  gradient 4 mmHg  mean gradient 2 mmHg peak velocity .98 m/sec AVA 2.36 cm2. EF  remains 50-55% with posteior basal akinesis No  new RWMA;s Mild MR  RV normal with no increase in PA pressures No new effusion aorti  root intact.   Impressions:   - Pre-TAVR: EF 50-55% posterior basal akinesis. No effusion. Mild  MR. Normal RV with mild TR. No ASD/PFO. Aortic valve trileaflet  and severely calcified. Peak velocity 4.3 m/sec, peak gradient 73  mmHg mean gradient 45 mmHg. Mild to moderate AR. __________  [facility] 05/2016:  Prox RCA lesion, 100% stenosed.  SVG was injected is normal in caliber.  There is mild diffuse disease in the graft.  Mid Cx lesion, 100% stenosed.  SVG was injected is normal in caliber.  The graft exhibits minimal luminal irregularities.  was injected is normal in caliber.  There is severe focal disease in the graft.  2nd Mrg lesion, 80% stenosed.  LIMA due to inability to cannulate and is normal in caliber, and is anatomically normal.  Prox LAD lesion, 100% stenosed.  Mid Graft lesion, 50% stenosed.  RIMA was visualized by non-selective angiography is and is anatomically normal.   1. Right heart catheterization showed high normal pulmonary pressure and filling pressures. Normal cardiac output. 2. Severe underlying three-vessel coronary artery disease with patent grafts. 3. Moderate left subclavian stenosis not significant by gradients.  Recommendations: No revascularization is needed before TAVR.     EKG:  EKG is ordered today.  The EKG ordered  today demonstrates AV paced rhythm, 70 bpm  Recent Labs: 09/03/2020: BUN 11; Creatinine, Ser 0.99; Platelets 108 09/05/2020: Hemoglobin 13.6; Hemoglobin 13.9; Potassium 3.1; Potassium 3.2; Sodium 142; Sodium 142  Recent Lipid Panel    Component Value Date/Time   CHOL 122 10/02/2018 0847   CHOL 160 04/11/2018 1451   TRIG 147 10/02/2018 0847   HDL 30 (L) 10/02/2018 0847   HDL 44 04/11/2018 1451   CHOLHDL 4.1 10/02/2018 0847   VLDL 29 10/02/2018 0847   LDLCALC 63 10/02/2018 0847   LDLCALC 63 04/11/2018 1451     PHYSICAL EXAM:    VS:  BP (!) 94/54   Pulse 70   Ht 5\' 9"  (1.753 m)   Wt 195 lb (88.5 kg)   BMI 28.80 kg/m   BMI: Body mass index is 28.8 kg/m.  Physical Exam Constitutional:      Appearance: He is well-developed.  HENT:     Head: Normocephalic and atraumatic.  Eyes:     General:        Right eye: No discharge.        Left eye: No discharge.  Neck:     Vascular: No JVD.  Cardiovascular:     Rate and Rhythm: Normal rate and regular rhythm.     Pulses: No midsystolic click and no opening snap.          Posterior tibial pulses are 2+ on the right side and 2+ on the left side.     Heart sounds: Normal heart sounds, S1 normal and S2 normal. Heart sounds not distant. No murmur heard.  No friction rub.     Comments: Right femoral cardiac cath site is well-healing with faint resolving ecchymosis.  No swelling, warmth, erythema, or tenderness to palpation.  No active bleeding.  No bruit. Pulmonary:     Effort: Pulmonary effort is normal. No respiratory distress.     Breath sounds: Normal breath sounds. No decreased breath sounds, wheezing or rales.  Chest:     Chest wall: No tenderness.  Abdominal:     General: There is no distension.     Palpations: Abdomen is soft.     Tenderness: There is no abdominal tenderness.  Musculoskeletal:     Cervical back: Normal range of motion.  Skin:    General: Skin is warm and dry.     Nails: There is no clubbing.  Neurological:     Mental Status: He is alert and oriented to person, place, and time.  Psychiatric:        Speech: Speech normal.        Behavior: Behavior normal.        Thought Content: Thought content normal.        Judgment: Judgment normal.     Wt Readings from Last 3 Encounters:  09/10/20 195 lb (88.5 kg)  09/05/20 195 lb (88.5 kg)  08/07/20 196 lb 6.4 oz (89.1 kg)     ASSESSMENT & PLAN:   1. 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  Continue current medical therapy and secondary prevention with aspirin, atorvastatin, carvedilol, and Imdur.  Post-cath instruction.  Recommend he enlist some assistance with strenuous activities such as wood splitting/jacking up his car and changing the wheels.  2. HFrEF secondary to ICM: He appears euvolemic and well compensated with NYHA class II symptoms.  Most recent echo demonstrated  an improvement in his LV SF as outlined above following medical therapy and CRT.  Continue GDMT including carvedilol and Entresto.  Not requiring standing diuretic.  Relative hypotension precludes escalation of GDMT or addition of MRA.  3. Aortic stenosis: Status post TAVR.  Most recent echo from earlier this month showed normal device function with a mean gradient of 14 mmHg.  Continue to monitor periodically.  SBE PPx.  4. PAD: No symptoms of claudication following bilateral iliac stenting.  Continue aspirin and Lipitor.  5. HTN: Blood pressure is on the soft side today though is asymptomatic.  Blood pressure typically well controlled.  If BP continues to run on the soft side he will let us know and we may need to decrease Imdur or carvedilol.  6. HLD: He remains on high-dose atorvastatin.  Most recent LDL of 82 with prior LDL less than 70 on current therapy.  Consider fasting lipid panel in follow-up.  Disposition: F/u with Dr. Fletcher Anon or an APP in 3 months.   Medication Adjustments/Labs and Tests Ordered: Current medicines are reviewed at length with the patient today.  Concerns regarding medicines are outlined above. Medication changes, Labs and Tests ordered today are summarized above and listed in the Patient Instructions accessible in Encounters.   Signed, Christell Faith, PA-C 09/10/2020 4:14 PM     [place] [address] [place], [place] [address] [phone]

## 2020-09-03 NOTE — Telephone Encounter (Signed)
-----   Message from Wellington Hampshire, MD sent at 09/03/2020  4:05 PM EDT ----- Mild hypokalemia.  Add K-Dur 20 mEq once daily.

## 2020-09-04 ENCOUNTER — Telehealth: Payer: Self-pay | Admitting: *Deleted

## 2020-09-04 LAB — SARS CORONAVIRUS 2 (TAT 6-24 HRS): SARS Coronavirus 2: NEGATIVE

## 2020-09-04 NOTE — Telephone Encounter (Signed)
Pt contacted pre-catheterization scheduled at [facility] for: Friday September 05, 2020 9 AM Verified arrival time and place: [facility]) at: 7 AM   No solid food after midnight prior to cath, clear liquids until 5 AM day of procedure.  AM meds can be  taken pre-cath with sips of water including: ASA 81 mg   Confirmed patient has responsible adult to drive home post procedure and be with patient first 24 hours after arriving home: yes  You are allowed ONE visitor in the waiting room during the time you are at the hospital for your procedure. Both you and your visitor must wear a mask once you enter the hospital.       COVID-19 Pre-Screening Questions:   In the past 14 days have you had a new cough, new headache, new nasal congestion, fever (100.4 or greater) unexplained body aches, new sore throat, or sudden loss of taste or sense of smell? no  In the past 14 days have you been around anyone with known Covid 19? no  Have you been vaccinated for COVID-19? Yes, see immunization history   Reviewed procedure/mask/visitor instructions, COVID-19 questions with pt's daughter (DPR), [name].

## 2020-09-05 ENCOUNTER — Other Ambulatory Visit: Payer: Self-pay

## 2020-09-05 ENCOUNTER — Encounter (HOSPITAL_COMMUNITY): Payer: Self-pay | Admitting: Cardiovascular Disease

## 2020-09-05 ENCOUNTER — Ambulatory Visit (HOSPITAL_COMMUNITY)
Admission: RE | Admit: 2020-09-05 | Discharge: 2020-09-05 | Disposition: A | Discharge status: Home / Self Care | Payer: Medicare Other | Attending: Cardiovascular Disease | Admitting: Cardiovascular Disease

## 2020-09-05 ENCOUNTER — Encounter (HOSPITAL_COMMUNITY)
Admission: RE | Disposition: A | Discharge status: Home / Self Care | Payer: Self-pay | Source: Home / Self Care | Attending: Cardiovascular Disease

## 2020-09-05 DIAGNOSIS — K219 Gastro-esophageal reflux disease without esophagitis: Secondary | ICD-10-CM | POA: Insufficient documentation

## 2020-09-05 DIAGNOSIS — I739 Peripheral vascular disease, unspecified: Secondary | ICD-10-CM | POA: Insufficient documentation

## 2020-09-05 DIAGNOSIS — I25119 Atherosclerotic heart disease of native coronary artery with unspecified angina pectoris: Secondary | ICD-10-CM

## 2020-09-05 DIAGNOSIS — Z7982 Long term (current) use of aspirin: Secondary | ICD-10-CM | POA: Insufficient documentation

## 2020-09-05 DIAGNOSIS — I5022 Chronic systolic (congestive) heart failure: Secondary | ICD-10-CM

## 2020-09-05 DIAGNOSIS — Z951 Presence of aortocoronary bypass graft: Secondary | ICD-10-CM | POA: Insufficient documentation

## 2020-09-05 DIAGNOSIS — I110 Hypertensive heart disease with heart failure: Secondary | ICD-10-CM | POA: Diagnosis not present

## 2020-09-05 DIAGNOSIS — Z79899 Other long term (current) drug therapy: Secondary | ICD-10-CM | POA: Diagnosis not present

## 2020-09-05 DIAGNOSIS — I25719 Atherosclerosis of autologous vein coronary artery bypass graft(s) with unspecified angina pectoris: Secondary | ICD-10-CM

## 2020-09-05 DIAGNOSIS — I350 Nonrheumatic aortic (valve) stenosis: Secondary | ICD-10-CM | POA: Insufficient documentation

## 2020-09-05 DIAGNOSIS — G4733 Obstructive sleep apnea (adult) (pediatric): Secondary | ICD-10-CM | POA: Diagnosis not present

## 2020-09-05 DIAGNOSIS — I25709 Atherosclerosis of coronary artery bypass graft(s), unspecified, with unspecified angina pectoris: Secondary | ICD-10-CM | POA: Insufficient documentation

## 2020-09-05 DIAGNOSIS — D649 Anemia, unspecified: Secondary | ICD-10-CM | POA: Diagnosis not present

## 2020-09-05 DIAGNOSIS — I208 Other forms of angina pectoris: Secondary | ICD-10-CM

## 2020-09-05 DIAGNOSIS — Z87891 Personal history of nicotine dependence: Secondary | ICD-10-CM | POA: Diagnosis not present

## 2020-09-05 DIAGNOSIS — I252 Old myocardial infarction: Secondary | ICD-10-CM | POA: Diagnosis not present

## 2020-09-05 DIAGNOSIS — R931 Abnormal findings on diagnostic imaging of heart and coronary circulation: Secondary | ICD-10-CM

## 2020-09-05 DIAGNOSIS — I2089 Other forms of angina pectoris: Secondary | ICD-10-CM

## 2020-09-05 DIAGNOSIS — Z952 Presence of prosthetic heart valve: Secondary | ICD-10-CM | POA: Insufficient documentation

## 2020-09-05 DIAGNOSIS — E785 Hyperlipidemia, unspecified: Secondary | ICD-10-CM | POA: Insufficient documentation

## 2020-09-05 HISTORY — PX: RIGHT HEART CATH AND CORONARY/GRAFT ANGIOGRAPHY: CATH118265

## 2020-09-05 LAB — POTASSIUM: Potassium: 3.5 mmol/L (ref 3.5–5.1)

## 2020-09-05 LAB — POCT I-STAT 7, (LYTES, BLD GAS, ICA,H+H)
Acid-Base Excess: 1 mmol/L (ref 0.0–2.0)
Bicarbonate: 25.8 mmol/L (ref 20.0–28.0)
Calcium, Ion: 1.2 mmol/L (ref 1.15–1.40)
HCT: 40 % (ref 39.0–52.0)
Hemoglobin: 13.6 g/dL (ref 13.0–17.0)
O2 Saturation: 97 %
Potassium: 3.1 mmol/L — ABNORMAL LOW (ref 3.5–5.1)
Sodium: 142 mmol/L (ref 135–145)
TCO2: 27 mmol/L (ref 22–32)
pCO2 arterial: 41.5 mmHg (ref 32.0–48.0)
pH, Arterial: 7.401 (ref 7.350–7.450)
pO2, Arterial: 94 mmHg (ref 83.0–108.0)

## 2020-09-05 LAB — POCT I-STAT EG7
Acid-Base Excess: 1 mmol/L (ref 0.0–2.0)
Bicarbonate: 27.2 mmol/L (ref 20.0–28.0)
Calcium, Ion: 1.25 mmol/L (ref 1.15–1.40)
HCT: 41 % (ref 39.0–52.0)
Hemoglobin: 13.9 g/dL (ref 13.0–17.0)
O2 Saturation: 72 %
Potassium: 3.2 mmol/L — ABNORMAL LOW (ref 3.5–5.1)
Sodium: 142 mmol/L (ref 135–145)
TCO2: 29 mmol/L (ref 22–32)
pCO2, Ven: 46.1 mmHg (ref 44.0–60.0)
pH, Ven: 7.379 (ref 7.250–7.430)
pO2, Ven: 39 mmHg (ref 32.0–45.0)

## 2020-09-05 SURGERY — RIGHT HEART CATH AND CORONARY/GRAFT ANGIOGRAPHY
Anesthesia: LOCAL

## 2020-09-05 MED ORDER — ONDANSETRON HCL 4 MG/2ML IJ SOLN: 4.0000 mg | Freq: Four times a day (QID) | INTRAMUSCULAR | Status: DC | PRN

## 2020-09-05 MED ORDER — SODIUM CHLORIDE 0.9 % WEIGHT BASED INFUSION
267.0000 mL/h | INTRAVENOUS | Status: AC
  Administered 2020-09-05: 267 mL/h via INTRAVENOUS

## 2020-09-05 MED ORDER — IOHEXOL 350 MG/ML SOLN
INTRAVENOUS | Status: DC | PRN
  Administered 2020-09-05: 110 mL

## 2020-09-05 MED ORDER — SODIUM CHLORIDE 0.9% FLUSH: 3.0000 mL | Freq: Two times a day (BID) | INTRAVENOUS | Status: DC

## 2020-09-05 MED ORDER — SODIUM CHLORIDE 0.9 % IV SOLN: 250.0000 mL | INTRAVENOUS | Status: DC | PRN

## 2020-09-05 MED ORDER — SODIUM CHLORIDE 0.9% FLUSH: 3.0000 mL | INTRAVENOUS | Status: DC | PRN

## 2020-09-05 MED ORDER — SODIUM CHLORIDE 0.9 % IV SOLN: INTRAVENOUS | Status: DC

## 2020-09-05 MED ORDER — FENTANYL CITRATE (PF) 100 MCG/2ML IJ SOLN
INTRAMUSCULAR | Status: DC | PRN
Start: 2020-09-05 — End: 2020-09-05
  Administered 2020-09-05: 25 ug via INTRAVENOUS

## 2020-09-05 MED ORDER — MIDAZOLAM HCL 2 MG/2ML IJ SOLN
INTRAMUSCULAR | Status: AC
  Filled 2020-09-05: qty 2

## 2020-09-05 MED ORDER — HEPARIN (PORCINE) IN NACL 1000-0.9 UT/500ML-% IV SOLN
INTRAVENOUS | Status: DC | PRN
  Administered 2020-09-05 (×2): 500 mL

## 2020-09-05 MED ORDER — MIDAZOLAM HCL 2 MG/2ML IJ SOLN
INTRAMUSCULAR | Status: DC | PRN
  Administered 2020-09-05: 1 mg via INTRAVENOUS

## 2020-09-05 MED ORDER — LIDOCAINE HCL (PF) 1 % IJ SOLN
INTRAMUSCULAR | Status: AC
  Filled 2020-09-05: qty 30

## 2020-09-05 MED ORDER — HEPARIN (PORCINE) IN NACL 1000-0.9 UT/500ML-% IV SOLN
INTRAVENOUS | Status: AC
  Filled 2020-09-05: qty 1000

## 2020-09-05 MED ORDER — LIDOCAINE HCL (PF) 1 % IJ SOLN
INTRAMUSCULAR | Status: DC | PRN
  Administered 2020-09-05: 15 mL

## 2020-09-05 MED ORDER — ASPIRIN 81 MG PO CHEW
81.0000 mg | CHEWABLE_TABLET | ORAL | Status: AC
  Administered 2020-09-05: 81 mg via ORAL
  Filled 2020-09-05: qty 1

## 2020-09-05 MED ORDER — FENTANYL CITRATE (PF) 100 MCG/2ML IJ SOLN
INTRAMUSCULAR | Status: AC
  Filled 2020-09-05: qty 2

## 2020-09-05 MED ORDER — ACETAMINOPHEN 325 MG PO TABS: 650.0000 mg | ORAL_TABLET | ORAL | Status: DC | PRN

## 2020-09-05 MED ORDER — SODIUM CHLORIDE 0.9 % WEIGHT BASED INFUSION: 89.0000 mL/h | INTRAVENOUS | Status: DC

## 2020-09-05 MED ORDER — IOHEXOL 350 MG/ML SOLN
INTRAVENOUS | Status: AC
  Filled 2020-09-05: qty 1

## 2020-09-05 SURGICAL SUPPLY — 18 items
CATH INFINITI 5 FR IM (CATHETERS) ×1 IMPLANT
CATH INFINITI 5FR MULTPACK ANG (CATHETERS) ×1 IMPLANT
CATH SWAN GANZ 7F STRAIGHT (CATHETERS) ×1 IMPLANT
DEVICE CLOSURE MYNXGRIP 5F (Vascular Products) ×1 IMPLANT
GUIDEWIRE .025 260CM (WIRE) ×1 IMPLANT
KIT HEART LEFT (KITS) ×2 IMPLANT
KIT MICROPUNCTURE NIT STIFF (SHEATH) ×1 IMPLANT
PACK CARDIAC CATHETERIZATION (CUSTOM PROCEDURE TRAY) ×2 IMPLANT
PINNACLE LONG 5F 25CM (SHEATH) ×2
SHEATH INTROD PINNACLE 5F 25CM (SHEATH) IMPLANT
SHEATH PINNACLE 5F 10CM (SHEATH) ×1 IMPLANT
SHEATH PINNACLE 7F 10CM (SHEATH) ×1 IMPLANT
SHEATH PROBE COVER 6X72 (BAG) ×1 IMPLANT
TRANSDUCER W/STOPCOCK (MISCELLANEOUS) ×2 IMPLANT
TUBING CIL FLEX 10 FLL-RA (TUBING) ×2 IMPLANT
WIRE EMERALD 3MM-J .035X150CM (WIRE) ×1 IMPLANT
WIRE HI TORQ VERSACORE 300 (WIRE) ×1 IMPLANT
WIRE MICROINTRODUCER 60CM (WIRE) ×1 IMPLANT

## 2020-09-05 NOTE — Discharge Instructions (Signed)
Femoral Site Care This sheet gives you information about how to care for yourself after your procedure. Your health care provider may also give you more specific instructions. If you have problems or questions, contact your health care provider. What can I expect after the procedure? After the procedure, it is common to have:  Bruising that usually fades within 1-2 weeks.  Tenderness at the site. Follow these instructions at home: Wound care  Follow instructions from your health care provider about how to take care of your insertion site. Make sure you: ? Wash your hands with soap and water before you change your bandage (dressing). If soap and water are not available, use hand sanitizer. ? Change your dressing as told by your health care provider. ? Leave stitches (sutures), skin glue, or adhesive strips in place. These skin closures may need to stay in place for 2 weeks or longer. If adhesive strip edges start to loosen and curl up, you may trim the loose edges. Do not remove adhesive strips completely unless your health care provider tells you to do that.  Do not take baths, swim, or use a hot tub until your health care provider approves.  You may shower 24-48 hours after the procedure or as told by your health care provider. ? Gently wash the site with plain soap and water. ? Pat the area dry with a clean towel. ? Do not rub the site. This may cause bleeding.  Do not apply powder or lotion to the site. Keep the site clean and dry.  Check your femoral site every day for signs of infection. Check for: ? Redness, swelling, or pain. ? Fluid or blood. ? Warmth. ? Pus or a bad smell. Activity  For the first 2-3 days after your procedure, or as long as directed: ? Avoid climbing stairs as much as possible. ? Do not squat.  Do not lift anything that is heavier than 10 lb (4.5 kg), or the limit that you are told, until your health care provider says that it is safe.  Rest as  directed. ? Avoid sitting for a long time without moving. Get up to take short walks every 1-2 hours.  Do not drive for 24 hours if you were given a medicine to help you relax (sedative). General instructions  Take over-the-counter and prescription medicines only as told by your health care provider.  Keep all follow-up visits as told by your health care provider. This is important. Contact a health care provider if you have:  A fever or chills.  You have redness, swelling, or pain around your insertion site. Get help right away if:  The catheter insertion area swells very fast.  You pass out.  You suddenly start to sweat or your skin gets clammy.  The catheter insertion area is bleeding, and the bleeding does not stop when you hold steady pressure on the area.  The area near or just beyond the catheter insertion site becomes pale, cool, tingly, or numb. These symptoms may represent a serious problem that is an emergency. Do not wait to see if the symptoms will go away. Get medical help right away. Call your local emergency services (911 in the [place] Do not drive yourself to the hospital. Summary  After the procedure, it is common to have bruising that usually fades within 1-2 weeks.  Check your femoral site every day for signs of infection.  Do not lift anything that is heavier than 10 lb (4.5 kg), or the   limit that you are told, until your health care provider says that it is safe. This information is not intended to replace advice given to you by your health care provider. Make sure you discuss any questions you have with your health care provider. Document Revised: 12/12/2017 Document Reviewed: 12/12/2017 Elsevier Patient Education  2020 [employer]  

## 2020-09-05 NOTE — Progress Notes (Signed)
Patient and wife was given discharge instructions. Both verbalized understanding. 

## 2020-09-05 NOTE — Interval H&P Note (Signed)
Cath Lab Visit (complete for each Cath Lab visit)  Clinical Evaluation Leading to the Procedure:   ACS: No.  Non-ACS:    Anginal Classification: CCS III  Anti-ischemic medical therapy: Maximal Therapy (2 or more classes of medications)  Non-Invasive Test Results: High-risk stress test findings: cardiac mortality >3%/year  Prior CABG: Previous CABG      History and Physical Interval Note:  09/05/2020 8:46 AM  Matthew [name]  has presented today for surgery, with the diagnosis of CAD.  The various methods of treatment have been discussed with the patient and family. After consideration of risks, benefits and other options for treatment, the patient has consented to  Procedure(s): RIGHT/LEFT HEART CATH AND CORONARY/GRAFT ANGIOGRAPHY (N/A) as a surgical intervention.  The patient's history has been reviewed, patient examined, no change in status, stable for surgery.  I have reviewed the patient's chart and labs.  Questions were answered to the patient's satisfaction.     [name]

## 2020-09-10 ENCOUNTER — Other Ambulatory Visit: Payer: Self-pay

## 2020-09-10 ENCOUNTER — Encounter: Payer: Self-pay | Admitting: Physician Assistant

## 2020-09-10 ENCOUNTER — Ambulatory Visit (INDEPENDENT_AMBULATORY_CARE_PROVIDER_SITE_OTHER): Payer: Medicare Other | Admitting: Physician Assistant

## 2020-09-10 VITALS — BP 94/54 | HR 70 | Ht 69.0 in | Wt 195.0 lb

## 2020-09-10 DIAGNOSIS — E785 Hyperlipidemia, unspecified: Secondary | ICD-10-CM

## 2020-09-10 DIAGNOSIS — I10 Essential (primary) hypertension: Secondary | ICD-10-CM

## 2020-09-10 DIAGNOSIS — I25118 Atherosclerotic heart disease of native coronary artery with other forms of angina pectoris: Secondary | ICD-10-CM

## 2020-09-10 DIAGNOSIS — Z951 Presence of aortocoronary bypass graft: Secondary | ICD-10-CM

## 2020-09-10 DIAGNOSIS — I447 Left bundle-branch block, unspecified: Secondary | ICD-10-CM

## 2020-09-10 DIAGNOSIS — I255 Ischemic cardiomyopathy: Secondary | ICD-10-CM | POA: Diagnosis not present

## 2020-09-10 DIAGNOSIS — Z952 Presence of prosthetic heart valve: Secondary | ICD-10-CM

## 2020-09-10 DIAGNOSIS — I5020 Unspecified systolic (congestive) heart failure: Secondary | ICD-10-CM

## 2020-09-10 DIAGNOSIS — I359 Nonrheumatic aortic valve disorder, unspecified: Secondary | ICD-10-CM

## 2020-09-10 DIAGNOSIS — I739 Peripheral vascular disease, unspecified: Secondary | ICD-10-CM

## 2020-09-10 NOTE — Patient Instructions (Signed)
Medication Instructions:  Your physician recommends that you continue on your current medications as directed. Please refer to the Current Medication list given to you today.   *If you need a refill on your cardiac medications before your next appointment, please call your pharmacy*   Lab Work: No Orders If you have labs (blood work) drawn today and your tests are completely normal, you will receive your results only by:  [place] (if you have MyChart) OR  A paper copy in the mail If you have any lab test that is abnormal or we need to change your treatment, we will call you to review the results.   Testing/Procedures: None Ordered   Follow-Up: At [facility], you and your health needs are our priority.  As part of our continuing mission to provide you with exceptional heart care, we have created designated Provider Care Teams.  These Care Teams include your primary Cardiologist (physician) and Advanced Practice Providers (APPs -  Physician Assistants and Nurse Practitioners) who all work together to provide you with the care you need, when you need it.  We recommend signing up for the patient portal called "MyChart".  Sign up information is provided on this After Visit Summary.  MyChart is used to connect with patients for Virtual Visits (Telemedicine).  Patients are able to view lab/test results, encounter notes, upcoming appointments, etc.  Non-urgent messages can be sent to your provider as well.   To learn more about what you can do with MyChart, go to [email or web address].    Your next appointment:   3 month(s)  The format for your next appointment:   In Person  Provider:   You may see [name], MD or one of the following Advanced Practice Providers on your designated Care Team:     Christell Faith, [place]

## 2020-09-15 ENCOUNTER — Other Ambulatory Visit: Payer: Self-pay | Admitting: Physician Assistant

## 2020-09-25 ENCOUNTER — Other Ambulatory Visit: Payer: Self-pay

## 2020-09-25 ENCOUNTER — Inpatient Hospital Stay: Payer: Medicare Other | Attending: Oncology

## 2020-09-25 DIAGNOSIS — E538 Deficiency of other specified B group vitamins: Secondary | ICD-10-CM

## 2020-09-25 DIAGNOSIS — D509 Iron deficiency anemia, unspecified: Secondary | ICD-10-CM | POA: Insufficient documentation

## 2020-09-25 DIAGNOSIS — D696 Thrombocytopenia, unspecified: Secondary | ICD-10-CM | POA: Diagnosis present

## 2020-09-25 LAB — CBC WITH DIFFERENTIAL/PLATELET
Abs Immature Granulocytes: 0.03 10*3/uL (ref 0.00–0.07)
Basophils Absolute: 0.1 10*3/uL (ref 0.0–0.1)
Basophils Relative: 1 %
Eosinophils Absolute: 0.1 10*3/uL (ref 0.0–0.5)
Eosinophils Relative: 2 %
HCT: 41.7 % (ref 39.0–52.0)
Hemoglobin: 14.4 g/dL (ref 13.0–17.0)
Immature Granulocytes: 0 %
Lymphocytes Relative: 27 %
Lymphs Abs: 1.9 10*3/uL (ref 0.7–4.0)
MCH: 31.6 pg (ref 26.0–34.0)
MCHC: 34.5 g/dL (ref 30.0–36.0)
MCV: 91.4 fL (ref 80.0–100.0)
Monocytes Absolute: 0.6 10*3/uL (ref 0.1–1.0)
Monocytes Relative: 8 %
Neutro Abs: 4.3 10*3/uL (ref 1.7–7.7)
Neutrophils Relative %: 62 %
Platelets: 102 10*3/uL — ABNORMAL LOW (ref 150–400)
RBC: 4.56 MIL/uL (ref 4.22–5.81)
RDW: 12.9 % (ref 11.5–15.5)
WBC: 7 10*3/uL (ref 4.0–10.5)
nRBC: 0 % (ref 0.0–0.2)

## 2020-09-25 LAB — IRON AND TIBC
Iron: 78 ug/dL (ref 45–182)
Saturation Ratios: 29 % (ref 17.9–39.5)
TIBC: 267 ug/dL (ref 250–450)
UIBC: 189 ug/dL

## 2020-09-25 LAB — FERRITIN: Ferritin: 159 ng/mL (ref 24–336)

## 2020-09-25 LAB — VITAMIN B12: Vitamin B-12: 926 pg/mL — ABNORMAL HIGH (ref 180–914)

## 2020-10-28 ENCOUNTER — Ambulatory Visit (INDEPENDENT_AMBULATORY_CARE_PROVIDER_SITE_OTHER): Payer: Medicare Other

## 2020-10-28 DIAGNOSIS — I255 Ischemic cardiomyopathy: Secondary | ICD-10-CM | POA: Diagnosis not present

## 2020-10-29 LAB — CUP PACEART REMOTE DEVICE CHECK
Battery Remaining Longevity: 79 mo
Battery Remaining Percentage: 95.5 %
Battery Voltage: 2.99 V
Brady Statistic AP VP Percent: 84 %
Brady Statistic AP VS Percent: 1 %
Brady Statistic AS VP Percent: 11 %
Brady Statistic AS VS Percent: 1 %
Brady Statistic RA Percent Paced: 81 %
Date Time Interrogation Session: 20211116224504
Implantable Lead Implant Date: 20200810
Implantable Lead Implant Date: 20200810
Implantable Lead Implant Date: 20200810
Implantable Lead Location: 753858
Implantable Lead Location: 753859
Implantable Lead Location: 753860
Implantable Lead Model: 5076
Implantable Lead Model: 5076
Implantable Pulse Generator Implant Date: 20200810
Lead Channel Impedance Value: 410 Ohm
Lead Channel Impedance Value: 450 Ohm
Lead Channel Impedance Value: 590 Ohm
Lead Channel Pacing Threshold Amplitude: 0.75 V
Lead Channel Pacing Threshold Amplitude: 1 V
Lead Channel Pacing Threshold Amplitude: 1 V
Lead Channel Pacing Threshold Pulse Width: 0.4 ms
Lead Channel Pacing Threshold Pulse Width: 0.4 ms
Lead Channel Pacing Threshold Pulse Width: 1 ms
Lead Channel Sensing Intrinsic Amplitude: 1.5 mV
Lead Channel Sensing Intrinsic Amplitude: 12 mV
Lead Channel Setting Pacing Amplitude: 2 V
Lead Channel Setting Pacing Amplitude: 2 V
Lead Channel Setting Pacing Amplitude: 2 V
Lead Channel Setting Pacing Pulse Width: 0.4 ms
Lead Channel Setting Pacing Pulse Width: 1 ms
Lead Channel Setting Sensing Sensitivity: 2 mV
Pulse Gen Model: 3562
Pulse Gen Serial Number: 9130392

## 2020-10-30 NOTE — Progress Notes (Signed)
Remote pacemaker transmission.   

## 2020-11-16 ENCOUNTER — Other Ambulatory Visit: Payer: Self-pay | Admitting: Cardiovascular Disease

## 2020-12-15 NOTE — Progress Notes (Signed)
Cardiology Office Note    Date:  12/18/2020   ID:  Matthew [name], DOB 08/06/1936, MRN [id number]  PCP:  Antionette Char, MD  Cardiologist:  [name], MD  Electrophysiologist:  Virl Axe, MD   Chief Complaint: Follow up  History of Present Illness:   Matthew [name] is a 85 y.o. male with history of CAD s/p CABG in 1991 with redo bypass in 2004, HFrEF secondary to ICM s/p CRT-P in 07/2019, aortic stenosis s/p TAVR in 05/2016 with post procedure LBBB, PAD s/p bilateral common iliac artery kissing stents in 2015, PVCs, HTN, HLD, and GI bleeding in 09/2018 gastric AVMs s/p therapy with acute blood loss anemia who presents for follow-up of CAD and cardiomyopathy.  R/LHC in 05/2016, prior to his TAVR, showed severe underlying 3-vessel CAD with patent grafts (proximal LAD 100% stenosed, mid LCx 100% stenosed, OM2 80% stenosed, proximal RCA 100% stenosed, LIMA-LAD was unable to be cannulated and was anatomically normal and normal in caliber, RIMA-D2 was visualized by non-selective angiography and was anatomically normal, VG-OM2 with a patent, previously placed proximal graft stent, 50% mid VG-OM2 stenosis, VG-OM3 minimal luminal irregularities, VG-RPDA mild diffuse disease), RHC showed high normal pulmonary pressure and filling pressure, normal cardiac output. Following this, he underwent TAVR in 05/2016. Post procedure, he developed a LBBB coupled with a gradual decline in his EF. Pre-TAVR, his EF was 55-60% with follow up study shortly after his procedure showing an EF of 55%. In 2018, his EF was noted to be 45-50% then dropped to 30-35% in 2019. Cardiac cath in 2019, performed for worsening angina, showed significant underlying three-vessel CAD with patent LIMA to LAD, patent RIMA to diagonal, patent SVG to OM3 and patent SVG to rPDA. The SVG to OM2 was found to be occluded.However, OM2 had retrograde flow from SVG to OM3. There was possible left subclavian artery stenosis but it was not significant  by gradient. This was also interrogated by carotid Doppler and there was nothing to suggest significant left subclavian stenosis. Echo in 02/2019, showed an EF of 25-30%, mild aortic stenosis with a mean gradient of 17 mmHg and mild to moderate aortic insufficiency. With continued decline in his LVSF, with an underlying LBBB, he underwent CRT-P implantation in 07/2019.   He was seen by Dr. Fletcher Anon on 08/07/2020 and noted worsening episodes of substernal chest pain and tightness occurring with exertion and at rest as well as two episodes of presyncope while mowing the lawn. Echo on 09/02/2020 showed an improved LVSF with an EF of 40-45%, mildly dilated LV cavity size, moderate LVH, Gr1DD, elevated left atrial pressure, moderately reduced RVSF with normal RV cavity size, trivial mitral regurgitation, normal size and function of AVR. He underwent diagnostic cath on 09/05/2020 that showed significant underlying 3 vessel CAD with patent grafts including LIMA to LAD, RIMA to diagonal, SVG RCA, and SVG to OM 3.  There was no significant change in his coronary anatomy when compared to prior cardiac cath.  He was noted to have native small vessel CAD which was felt to possibly be the culprit for his stable anginal symptoms.  RHC showed normal filling pressures, normal pulmonary pressure, and normal cardiac output.  Continued medical therapy was recommended.  He was seen in follow-up on 09/10/2020 and was doing well from a cardiac perspective.  He had not had any further chest discomfort with noted stable to improved exertional shortness of breath.  No changes were made at that time.  He comes  in accompanied by his daughter today and continues to do well from a cardiac perspective.  Dating back to his cath in 08/2020 he has not had any further chest discomfort.  His chronic exertional dyspnea remains stable.  When he becomes short of breath he sits down to rest and symptoms resolved.  His weight is overall stable, he did have a 4  pound weight gain today when compared to his last visit in 08/2020 which is possibly attributed to the recent Thanksgiving and Christmas holidays.  His BP does remain on the low side though he is asymptomatic with this.  He is tolerating all cardiac medications without issues.  He remains active.  Most recent device interrogation showed a less than 1% AF/AT burden as reviewed by EP.  He does note a couple week history of melena-like stools.  Otherwise, he does not have any issues or concerns at this time.   Labs independently reviewed: 10/2020 - A1c 5.8, Hgb 14.5, PLT 106, BUN 12, serum creatinine 1.00, potassium 3.9, albumin 4.4, AST normal, TC 165, TG 112, HDL 49, LDL 96 01/2019 -  AST/ALT normal, magnesium 2.0 09/2018 - TC 122, TG 147, HDL 30, LDL 63, TSH normal  Past Medical History:  Diagnosis Date  . Alcohol abuse    stopped drinking 42 years ago from today 2020  . Anemia   . AS (aortic stenosis)    a. Echo 6/10: EF 55% mild AS; b. echo 06/2015; EF 55-60%, GR1DD, moderate AS, Peak velocity (S): 346 cm/s. Mean gradientS): 28 mm Hg. Peak gradient (S): 48 mm Hg. Valve area (VTI): 1.18 cm2;   c. Echo 4/17 - mild LVH, EF 55-60%, no RWMA, Gr 1 DD, mod to severe AS (mean 28 mmHg, peak 46 mmHg), mild LAE  . CAD (coronary artery disease)    a. MI 1996 w/ CABG 1996; b.redo CABG 06/2003; c. Myoview 7/09: EF 54% inferobasal infarct, no ischemia. Myoview 6/10 EF 43% inf wall infarct. no ischemia; c. cath 8/16 s/p DES to VG-OM2. OTW 3VD w/ patent VG->RPDA, VG->OM3,  & LIMA->LAD.  EF 55-65%.  . Carotid bruit    2009 0-39% on dopplers bilatrally  . Chest pain   . GERD (gastroesophageal reflux disease)   . History of kidney stones   . HOH (hard of hearing)   . HTN (hypertension)   . Hyperlipidemia   . Myocardial infarction ([place]) 1996  . Nephrolithiasis   . OSA (obstructive sleep apnea)   . PAD (peripheral artery disease) ([place]) 02/2014   Subtotal occlusion of right common iliac artery and 70% stenosis in  the left common iliac artery. Status post bilateral kissing stent placement. 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 Shortness of breath    with exertion  . Thrombocytopenia ([place])     Past Surgical History:  Procedure Laterality Date  . ABDOMINAL AORTAGRAM N/A 03/06/2014   Procedure: ABDOMINAL Maxcine Ham;  Surgeon: Wellington Hampshire, MD;  Location: [facility] CATH LAB;  Service: Cardiovascular;  Laterality: N/A;  . BIV PACEMAKER INSERTION CRT-P N/A 07/23/2019   Procedure: BIV PACEMAKER INSERTION CRT-P;  Surgeon: Deboraha Sprang, MD;  Location: [facility] CV LAB;  Service: Cardiovascular;  Laterality: N/A;  . CARDIAC CATHETERIZATION  02/2014   Severe three-vessel coronary artery disease with patent grafts.   Marland Kitchen CARDIAC CATHETERIZATION N/A 07/31/2015   Procedure: Right/Left Heart  Cath and Coronary/Graft Angiography;  Surgeon: Wellington Hampshire, MD;  Location: [facility] [facility] CV LAB;  Service: Cardiovascular;  Laterality: N/A;  . CARDIAC CATHETERIZATION N/A 07/31/2015   Procedure: Coronary Stent Intervention;  Surgeon: Wellington Hampshire, MD;  Location: [facility] CV LAB;  Service: Cardiovascular;  Laterality: N/A;  . CARDIAC CATHETERIZATION N/A 05/26/2016   Procedure: Right/Left Heart Cath and Coronary Angiography;  Surgeon: Wellington Hampshire, MD;  Location: [facility] CV LAB;  Service: Cardiovascular;  Laterality: N/A;  . COLONOSCOPY WITH PROPOFOL N/A 10/07/2018   Procedure: COLONOSCOPY WITH PROPOFOL;  Surgeon: Lin Landsman, MD;  Location[facility] ENDOSCOPY;  Service: Gastroenterology;  Laterality: N/A;  . COLONOSCOPY WITH PROPOFOL N/A 01/08/2019   Procedure: COLONOSCOPY WITH PROPOFOL;  Surgeon: Lin Landsman, MD;  Location: [facility] ENDOSCOPY;  Service: Gastroenterology;  Laterality: N/A;  . CORONARY ARTERY BYPASS GRAFT  1991   at [facility].  Redone 2004-3 vessels 1st time and 4 second time  . CYSTO     2/3 times for kidney stones  . ENTEROSCOPY N/A 12/25/2018   Procedure: ENTEROSCOPY Single balloon;  Surgeon: Lin Landsman, MD;  Location: [facility];  Service: Gastroenterology;  Laterality: N/A;  . ESOPHAGOGASTRODUODENOSCOPY (EGD) WITH PROPOFOL N/A 10/07/2018   Procedure: ESOPHAGOGASTRODUODENOSCOPY (EGD) WITH PROPOFOL;  Surgeon: Lin Landsman, MD;  Location: [facility] ENDOSCOPY;  Service: Gastroenterology;  Laterality: N/A;  . GIVENS CAPSULE STUDY N/A 12/22/2018   Procedure: GIVENS CAPSULE STUDY;  Surgeon: Lin Landsman, MD;  Location: [facility] ENDOSCOPY;  Service: Gastroenterology;  Laterality: N/A;  . INGUINAL HERNIA REPAIR  11/10/2011   Procedure: HERNIA REPAIR INGUINAL ADULT;  Surgeon: Donato Heinz;  Location: [facility];  Service: General;  Laterality: Right;  . LAPAROSCOPIC RIGHT COLECTOMY Right 02/09/2019   Procedure: LAPAROSCOPIC RIGHT COLECTOMY;  Surgeon: Jules Husbands, MD;  Location: [facility] ORS;  Service: General;  Laterality: Right;  . LEFT HEART CATH AND CORONARY ANGIOGRAPHY Left 05/15/2018   Procedure: LEFT HEART CATH AND CORONARY ANGIOGRAPHY;  Surgeon: Wellington Hampshire, MD;  Location: [place] CV LAB;  Service: Cardiovascular;  Laterality: Left;  . LEFT HEART CATHETERIZATION WITH CORONARY/GRAFT ANGIOGRAM N/A 03/06/2014   Procedure: LEFT HEART CATHETERIZATION WITH Beatrix Fetters;  Surgeon: Wellington Hampshire, MD;  Location: [place] CATH LAB;  Service: Cardiovascular;  Laterality: N/A;  . RIGHT HEART CATH AND CORONARY/GRAFT ANGIOGRAPHY N/A 09/05/2020   Procedure: RIGHT HEART CATH AND CORONARY/GRAFT ANGIOGRAPHY;  Surgeon: Wellington Hampshire, MD;  Location: [place] CV LAB;  Service: Cardiovascular;  Laterality: N/A;  . STOMACH SURGERY     removal of gastric ulcers  . TEE WITHOUT CARDIOVERSION N/A 06/01/2016   Procedure: TRANSESOPHAGEAL ECHOCARDIOGRAM (TEE);  Surgeon: Sherren Mocha, MD;  Location: [facility];   Service: Open Heart Surgery;  Laterality: N/A;  . TRANSCATHETER AORTIC VALVE REPLACEMENT, TRANSFEMORAL N/A 06/01/2016   Procedure: TRANSCATHETER AORTIC VALVE REPLACEMENT, TRANSFEMORAL;  Surgeon: Sherren Mocha, MD;  Location: [facility];  Service: Open Heart Surgery;  Laterality: N/A;    Current Medications: Current Meds  Medication Sig  . acetaminophen (TYLENOL) 325 MG tablet Take 650 mg by mouth every 6 (six) hours as needed for mild pain or headache.   Marland Kitchen aspirin EC 81 MG tablet Take 1 tablet (81 mg total) by mouth daily.  Marland Kitchen atorvastatin (LIPITOR) 80 MG tablet TAKE 1 TABLET BY MOUTH  DAILY AT 6 PM  . carvedilol (COREG) 3.125 MG tablet Take 1 tablet (3.125 mg total) by mouth 2 (two) times daily.  Marland Kitchen ENTRESTO 49-51 MG  TAKE 1 TABLET BY MOUTH  TWICE DAILY  . isosorbide mononitrate (IMDUR) 60 MG 24 hr tablet Take 1 tablet (60 mg total) by mouth daily.  . Multiple Vitamins-Minerals (PRESERVISION AREDS PO) Take 1 tablet by mouth daily.   . pantoprazole (PROTONIX) 40 MG tablet TAKE 1 TABLET BY MOUTH  DAILY  . potassium chloride SA (KLOR-CON M20) 20 MEQ tablet Take 1 tablet (20 mEq total) by mouth daily.  . tamsulosin (FLOMAX) 0.4 MG CAPS capsule Take 1 capsule (0.4 mg total) by mouth daily. Future refill request will need to be sent to the pcp  . tetrahydrozoline 0.05 % ophthalmic solution Place 2 drops into both eyes 4 (four) times daily as needed (for dry eyes).  . TURMERIC PO Take 1 capsule by mouth at bedtime.   . vitamin B-12 (CYANOCOBALAMIN) 1000 MCG tablet Take 1,000 mcg by mouth daily.  . [DISCONTINUED] carvedilol (COREG) 6.25 MG tablet TAKE 1 TABLET BY MOUTH  TWICE DAILY (Patient taking differently: Take 3.125 mg by mouth in the morning and at bedtime.)    Allergies:   Ranexa [ranolazine er]   Social History   Socioeconomic History  . Marital status: Married    Spouse name: Not on file  . Number of children: 2  . Years of education: Not on file  . Highest education level: Not on file   Occupational History  . Occupation: Retired  Tobacco Use  . Smoking status: Former Smoker    Packs/day: 1.00    Years: 50.00    Pack years: 50.00    Types: Cigarettes    Quit date: 01/24/1990    Years since quitting: 30.9  . Smokeless tobacco: Never Used  Vaping Use  . Vaping Use: Never used  Substance and Sexual Activity  . Alcohol use: No    Alcohol/week: 0.0 standard drinks    Comment: Alcoholic in past  . Drug use: No  . Sexual activity: Never  Other Topics Concern  . Not on file  Social History Narrative   Married an lives with wife in [place]. Retired form [employer].    Social Determinants of Health   Financial Resource Strain: Not on file  Food Insecurity: Not on file  Transportation Needs: Not on file  Physical Activity: Not on file  Stress: Not on file  Social Connections: Not on file     Family History:  The patient's family history includes Heart disease in his father and mother. There is no history of Colon cancer.  ROS:   Review of Systems  Constitutional: Positive for malaise/fatigue. Negative for chills, diaphoresis, fever and weight loss.  HENT: Negative for congestion.   Eyes: Negative for discharge and redness.  Respiratory: Positive for shortness of breath. Negative for cough, sputum production and wheezing.   Cardiovascular: Negative for chest pain, palpitations, orthopnea, claudication, leg swelling and PND.  Gastrointestinal: Positive for diarrhea and melena. Negative for abdominal pain, blood in stool, constipation, heartburn, nausea and vomiting.  Musculoskeletal: Negative for falls and myalgias.  Skin: Negative for rash.  Neurological: Positive for weakness. Negative for dizziness, tingling, tremors, sensory change, speech change, focal weakness and loss of consciousness.  Endo/Heme/Allergies: Does not bruise/bleed easily.  Psychiatric/Behavioral: Negative for substance abuse. The patient is not nervous/anxious.   All other  systems reviewed and are negative.    EKGs/Labs/Other Studies Reviewed:    Studies reviewed were summarized above. The additional studies were reviewed today:  [facility] 09/05/2020:  Prox LAD lesion is 100% stenosed.  2nd Mrg  lesion is 80% stenosed.  Ost Cx lesion is 30% stenosed.  Ost Cx to Prox Cx lesion is 100% stenosed.  Mid Cx lesion is 60% stenosed.  Prox RCA lesion is 100% stenosed.  And is normal in caliber.  The graft exhibits minimal luminal irregularities.  And is normal in caliber.  The graft exhibits severe focal disease.  Origin to Prox Graft lesion is 100% stenosed.  LIMA due to inability to cannulate and is normal in caliber.  The graft exhibits no disease.  SVG.  Origin lesion before Acute Mrg is 20% stenosed.  Ost RPDA lesion is 70% stenosed.  1. Significant underlying three-vessel coronary artery disease with patent grafts including LIMA to LAD, RIMA to diagonal, SVG to RCA and SVG to OM 3. No significant change in coronary anatomy since most recent cardiac catheterization. The patient does have native small vessel coronary artery disease which might be the culprit for his stable anginal symptoms. 2. Right heart catheterization showed normal filling pressures, normal pulmonary pressure and normal cardiac output.  Recommendations: Continue medical therapy. __________  2D echo 09/02/2020: 1. Left ventricular ejection fraction, by estimation, is 40 to 45%. The  left ventricle has mildly decreased function. Left ventricular endocardial  border not optimally defined to evaluate regional wall motion. The left  ventricular internal cavity size  was mildly dilated. There is moderate left ventricular hypertrophy. Left  ventricular diastolic parameters are consistent with Grade I diastolic  dysfunction (impaired relaxation). Elevated left atrial pressure.  2. Right ventricular systolic function is moderately reduced. The right  ventricular size is  normal. Tricuspid regurgitation signal is inadequate  for assessing PA pressure.  3. The mitral valve is normal in structure. Trivial mitral valve  regurgitation. No evidence of mitral stenosis.  4. The aortic valve has been repaired/replaced. Aortic valve  regurgitation is not visualized. Echo findings are consistent with normal  structure and function of the aortic valve prosthesis. Aortic valve mean  gradient measures 14.0 mmHg.  __________  2D echo 02/2019: 1. The left ventricle has severely reduced systolic function, with an  ejection fraction of 25-30%. The cavity size was moderately dilated. Left  ventricular diffuse hypokinesis. Unable to exclude regional wall motion  abnormality (challenging image  quality)  2. The right ventricle has normal systolic function. The cavity was  normal. There is no increase in right ventricular wall thickness. Right  ventricular systolic pressure is mildly elevated with an estimated  pressure of 38.0 mmHg.  3. Left atrial size was severely dilated.  4. Aortic valve regurgitation is mild to moderate. Mild stenosis of the  aortic valve. Mean gradient 17 mm Hg.  __________  2D echo 09/2018: - Left ventricle: The cavity size was mildly dilated. Wall  thickness was normal. Systolic function was moderately to  severely reduced. The estimated ejection fraction was in the  range of 30% to 35%. Wall motion was normal; there were no  regional wall motion abnormalities. Features are consistent with  a pseudonormal left ventricular filling pattern, with concomitant  abnormal relaxation and increased filling pressure (grade 2  diastolic dysfunction).  - Aortic valve: A bioprosthesis was present and functioning  normally. s/p TAVR. Mean gradient (S): 13 mm Hg.  - Mitral valve: Calcified annulus. There was mild regurgitation.  - Left atrium: The atrium was moderately dilated.  - Pulmonary arteries: Systolic pressure could not be  accurately  estimated.  __________  LHC 05/2018:  Prox LAD lesion is 100% stenosed.  2nd Mrg lesion is  80% stenosed.  Prox RCA lesion is 100% stenosed.  And is normal in caliber.  The graft exhibits minimal luminal irregularities.  And is normal in caliber.  The graft exhibits severe focal disease.  LIMA due to inability to cannulate and is normal in caliber.  The graft exhibits no disease.  Origin lesion is 40% stenosed.  Ost Cx to Prox Cx lesion is 100% stenosed.  Ost Cx lesion is 30% stenosed.  Mid Cx lesion is 60% stenosed.  Origin to Prox Graft lesion is 100% stenosed.  1. Significant underlying three-vessel coronary artery disease with patent LIMA to LAD, patent RIMA to diagonal, patent SVG to OM 3 and patent SVG to right PDA. The SVG to OM 2 is now occluded which is a new finding. However, OM2 gets retrograde flow from SVG to OM 3. 2. Possible significant left subclavian artery stenosis although no gradient was noted with pullback.  Recommendations: Overall difficult procedure due to significant tortuosity of the innominate artery and left subclavian artery.  No coronary revascularization is needed.  We have to investigate the significance of left subclavian artery stenosis which might be causing decreased flow into the LIMA to LAD. Recommend carotid Doppler with focus on the left subclavian artery. Endovascular intervention of the left subclavian artery is not straightforward due to tortuosity and also close origin to the LIMA. The patient is noted to have frequent PVCs before and throughout the cath. This might be contributing to his cardiomyopathy. Recommend a 24-hour Holter monitor. __________  24-hour Holter 05/2018: 5600 PVCs in 24 hours representing 6% burden. Occasional PACs. Average heart rate of 62 bpm. __________  2D echo 04/2018: - Left ventricle: The cavity size was normal. Systolic function was  moderately to severely reduced. The  estimated ejection fraction  was in the range of 30% to 35%. Diffuse hypokinesis. Regional  wall motion abnormalities cannot be excluded. Features are  consistent with a pseudonormal left ventricular filling pattern,  with concomitant abnormal relaxation and increased filling  pressure (grade 2 diastolic dysfunction).  - Aortic valve: A bioprosthesis was present. s/p TAVR Peak velocity  (S): 250 cm/s. Mean gradient (S): 14 mm Hg.  - Aortic root: The aortic root was mildly dilated.  - Mitral valve: There was mild regurgitation.  - Left atrium: The atrium was moderately dilated.  - Right ventricle: Systolic function was normal.  - Pulmonary arteries: Systolic pressure was within the normal  range.  __________  2D echo 05/2017: - Left ventricle: The cavity size was mildly dilated. There was  mild concentric hypertrophy. Systolic function was mildly  reduced. The estimated ejection fraction was in the range of 45%  to 50%. Doppler parameters are consistent with abnormal left  ventricular relaxation (grade 1 diastolic dysfunction).  - Ventricular septum: Septal motion showed abnormal function and  dyssynergy.  - Aortic valve: A TAVR prosthesis was present and functioning  normally. Mean gradient (S): 11 mm Hg. Peak gradient (S): 20 mm  Hg. Valve area (VTI): 1.79 cm^2.  - Left atrium: The atrium was moderately to severely dilated.  - Right atrium: The atrium was mildly dilated.   Impressions:   - Comapred to most recent echo, the LV is mildly more dilated with  mildly reduced EF.  __________  2D echo 06/2016: - Left ventricle: LVEF is approximately 55% with mid/distal  inferior hypokiensis; septal hypokdinesis; basal inferior  akinesis with aneurysmal dilitation. The cavity size was mildly  dilated. Wall thickness was normal. Doppler parameters are  consistent  with abnormal left ventricular relaxation (grade 1  diastolic dysfunction).  -  Aortic valve: AV prosthesis is well seated Peak and mean  gradients through the valve are 19 and 11 mm Hg respectively.  - Mitral valve: Calcified annulus. Mildly thickened leaflets .  - Left atrium: The atrium was severely dilated.  __________  2D echo 05/2016: - Left ventricle: Posterior basal akinesis The cavity size was  mildly dilated. Systolic function was normal. The estimated  ejection fraction was in the range of 50% to 55%. Doppler  parameters are consistent with both elevated ventricular  end-diastolic filling pressure and elevated left atrial filling  pressure.  - Aortic valve: Normal appearing 26 mm Sapien 3 valve with no  significant perivalvular regurgitation post TAVR. Valve area  (VTI): 0.81 cm^2. Valve area (Vmax): 0.83 cm^2. Valve area  (Vmean): 0.85 cm^2.  - Left atrium: The atrium was moderately dilated.  - Atrial septum: No defect or patent foramen ovale was identified. __________  TEE 05/2016: - Aortic valve: Valve area (VTI): 1.94 cm^2. Valve area (Vmax):  1.99 cm^2. Valve area (Vmean): 2.06 cm^2.  - Impressions: Pre-TAVR: EF 50-55% posterior basal akinesis. No  effusion. Mild MR. Normal RV with mild TR. No ASD/PFO. Aortic  valve trileaflet  and severely calcified. Peak velocity 4.3 m/sec, peak gradient 73  mmHg mean gradient 45 mmHg. Mild to moderate AR.    Post TAVR: Slightly high plaecement of 26 mm Sapien 3 valve.  Trivial perivalvular regurgitation at 2:00 on SA views. Peak  gradient 4 mmHg  mean gradient 2 mmHg peak velocity .98 m/sec AVA 2.36 cm2. EF  remains 50-55% with posteior basal akinesis No new RWMA;s Mild MR  RV normal with no increase in PA pressures No new effusion aorti  root intact.   Impressions:   - Pre-TAVR: EF 50-55% posterior basal akinesis. No effusion. Mild  MR. Normal RV with mild TR. No ASD/PFO. Aortic valve trileaflet  and severely calcified. Peak velocity 4.3 m/sec, peak  gradient 73  mmHg mean gradient 45 mmHg. Mild to moderate AR. __________  [facility] 05/2016:  Prox RCA lesion, 100% stenosed.  SVG was injected is normal in caliber.  There is mild diffuse disease in the graft.  Mid Cx lesion, 100% stenosed.  SVG was injected is normal in caliber.  The graft exhibits minimal luminal irregularities.  was injected is normal in caliber.  There is severe focal disease in the graft.  2nd Mrg lesion, 80% stenosed.  LIMA due to inability to cannulate and is normal in caliber, and is anatomically normal.  Prox LAD lesion, 100% stenosed.  Mid Graft lesion, 50% stenosed.  RIMA was visualized by non-selective angiography is and is anatomically normal.  1. Right heart catheterization showed high normal pulmonary pressure and filling pressures. Normal cardiac output. 2. Severe underlying three-vessel coronary artery disease with patent grafts. 3. Moderate left subclavian stenosis not significant by gradients.  Recommendations: No revascularization is needed before TAVR.    EKG:  EKG is ordered today.  The EKG ordered today demonstrates AV paced rhythm, 70 bpm, rare PVC  Recent Labs: 09/03/2020: BUN 11; Creatinine, Ser 0.99 09/05/2020: Potassium 3.1; Potassium 3.2; Sodium 142; Sodium 142 09/25/2020: Hemoglobin 14.4; Platelets 102  Recent Lipid Panel    Component Value Date/Time   CHOL 122 10/02/2018 0847   CHOL 160 04/11/2018 1451   TRIG 147 10/02/2018 0847   HDL 30 (L) 10/02/2018 0847   HDL 44 04/11/2018 1451   CHOLHDL 4.1 10/02/2018 0847  VLDL 29 10/02/2018 0847   LDLCALC 63 10/02/2018 0847   LDLCALC 63 04/11/2018 1451    PHYSICAL EXAM:    VS:  BP 94/60 (BP Location: Left Arm, Patient Position: Sitting, Cuff Size: Normal)   Pulse 70   Ht 5\' 9"  (1.753 m)   Wt 199 lb 6 oz (90.4 kg)   SpO2 96%   BMI 29.44 kg/m   BMI: Body mass index is 29.44 kg/m.  Physical Exam Vitals reviewed.  Constitutional:      Appearance: He is  well-developed and well-nourished.  HENT:     Head: Normocephalic and atraumatic.  Eyes:     General:        Right eye: No discharge.        Left eye: No discharge.  Neck:     Vascular: No JVD.  Cardiovascular:     Rate and Rhythm: Normal rate and regular rhythm.     Pulses: No midsystolic click and no opening snap.          Dorsalis pedis pulses are 2+ on the right side and 2+ on the left side.       Posterior tibial pulses are 2+ on the right side and 2+ on the left side.     Heart sounds: Normal heart sounds, S1 normal and S2 normal. Heart sounds not distant. No murmur heard. No friction rub.  Pulmonary:     Effort: Pulmonary effort is normal. No respiratory distress.     Breath sounds: Normal breath sounds. No decreased breath sounds, wheezing or rales.  Chest:     Chest wall: No tenderness.  Abdominal:     General: There is no distension.     Palpations: Abdomen is soft.     Tenderness: There is no abdominal tenderness.  Musculoskeletal:        General: No edema.     Cervical back: Normal range of motion.  Skin:    General: Skin is warm and dry.     Nails: There is no clubbing or cyanosis.  Neurological:     Mental Status: He is alert and oriented to person, place, and time.  Psychiatric:        Mood and Affect: Mood and affect normal.        Speech: Speech normal.        Behavior: Behavior normal.        Thought Content: Thought content normal.        Judgment: Judgment normal.     Wt Readings from Last 3 Encounters:  12/18/20 199 lb 6 oz (90.4 kg)  09/10/20 195 lb (88.5 kg)  09/05/20 195 lb (88.5 kg)     ASSESSMENT & PLAN:   1. 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  Continue current medical therapy/secondary prevention with aspirin, atorvastatin, carvedilol, and Imdur.  No indication for further ischemic testing at this  time.  2. HFrEF secondary to ICM: He appears euvolemic and well compensated with NYHA class II symptoms.  Most recent echo from 08/2020 showed improvement in his LV systolic function with a EF of 45% following medical therapy and CRT.  Continue GDMT including carvedilol and Entresto.  He is not requiring a standing diuretic at this time.  Hypotension, albeit asymptomatic, precludes escalation of GDMT/addition of MRA at this time.  3. Aortic stenosis: Status post TAVR.  Most recent echo from 08/2020 showed normal device function with a mean gradient of 14 mmHg.  Continue to monitor periodically.  SBE prophylaxis.  4. PAD: No symptoms of lifestyle limiting claudication at this time.  He recently brought in wood to his basement earlier today without issues.  5. HTN: Blood pressure is on the soft side today though he is asymptomatic.  Continue medical therapy as outlined above.  6. HLD: LDL of 96 from 10/2020 at his PCPs office with goal being less than 70.  Repeat lipid, direct LDL, and LFT today.  If LDL remains above goal of 70 we will add Zetia 10 mg daily with a follow-up lipid and liver function in approximately 8 weeks thereafter.  7. Melena: He reports a several week history of possible melena-like stools.  Check CBC.  Recommend he follow-up with PCP.  Disposition: F/u with Dr. Fletcher Anon or an APP in 4 months, and EP as directed.   Medication Adjustments/Labs and Tests Ordered: Current medicines are reviewed at length with the patient today.  Concerns regarding medicines are outlined above. Medication changes, Labs and Tests ordered today are summarized above and listed in the Patient Instructions accessible in Encounters.   Signed, Christell Faith, PA-C 12/18/2020 4:30 PM     [facility] [address] [place], [place] [address] [phone]

## 2020-12-17 ENCOUNTER — Ambulatory Visit: Payer: Medicare Other | Admitting: Dermatology

## 2020-12-17 ENCOUNTER — Other Ambulatory Visit: Payer: Self-pay

## 2020-12-17 DIAGNOSIS — D0439 Carcinoma in situ of skin of other parts of face: Secondary | ICD-10-CM

## 2020-12-17 DIAGNOSIS — D0461 Carcinoma in situ of skin of right upper limb, including shoulder: Secondary | ICD-10-CM

## 2020-12-17 DIAGNOSIS — D485 Neoplasm of uncertain behavior of skin: Secondary | ICD-10-CM

## 2020-12-17 DIAGNOSIS — L570 Actinic keratosis: Secondary | ICD-10-CM

## 2020-12-17 DIAGNOSIS — D045 Carcinoma in situ of skin of trunk: Secondary | ICD-10-CM

## 2020-12-17 DIAGNOSIS — C4492 Squamous cell carcinoma of skin, unspecified: Secondary | ICD-10-CM

## 2020-12-17 DIAGNOSIS — L578 Other skin changes due to chronic exposure to nonionizing radiation: Secondary | ICD-10-CM | POA: Diagnosis not present

## 2020-12-17 DIAGNOSIS — D1800 Hemangioma unspecified site: Secondary | ICD-10-CM

## 2020-12-17 HISTORY — DX: Squamous cell carcinoma of skin, unspecified: C44.92

## 2020-12-17 NOTE — Progress Notes (Addendum)
New Patient Visit  Subjective  Matthew [name] is a 85 y.o. male who presents for the following: Skin Problem (New patient here today for a spot at right face, present for 6 months and is sore. No history of skin cancer. ).  Patient accompanied by wife.   The following portions of the chart were reviewed this encounter and updated as appropriate:   Tobacco  Allergies  Meds  Problems  Med Hx  Surg Hx  Fam Hx      Review of Systems:  No other skin or systemic complaints except as noted in HPI or Assessment and Plan.  Objective  Well appearing patient in no apparent distress; mood and affect are within normal limits.  All skin waist up examined.  right angle of mandible 0.9cm scaly pink papule     Right upper arm 1.2cm scaly pink plaque     Right chest 0.7cm scaly pink papule     Upper mid back x 1, left post ear x 1, left helix x 1, right upper arm x 1 (4) Erythematous thin papules/macules with gritty scale.  Hypertrophic at right upper arm.    Assessment & Plan  Neoplasm of uncertain behavior of skin (3) right angle of mandible  Skin / nail biopsy Type of biopsy: tangential   Informed consent: discussed and consent obtained   Timeout: patient name, date of birth, surgical site, and procedure verified   Patient was prepped and draped in usual sterile fashion: Area prepped with isopropyl alcohol. Anesthesia: the lesion was anesthetized in a standard fashion   Anesthetic:  1% lidocaine w/ epinephrine 1-100,000 buffered w/ 8.4% NaHCO3 Instrument used: flexible razor blade   Hemostasis achieved with: aluminum chloride   Outcome: patient tolerated procedure well   Post-procedure details: wound care instructions given   Additional details:  Mupirocin and a bandage applied  Specimen 1 - Surgical pathology Differential Diagnosis: r/o SCC  Check Margins: No 0.9cm scaly pink papule  Right upper arm  Skin / nail biopsy Type of biopsy: tangential    Informed consent: discussed and consent obtained   Timeout: patient name, date of birth, surgical site, and procedure verified   Patient was prepped and draped in usual sterile fashion: Area prepped with isopropyl alcohol. Anesthesia: the lesion was anesthetized in a standard fashion   Anesthetic:  1% lidocaine w/ epinephrine 1-100,000 buffered w/ 8.4% NaHCO3 Instrument used: flexible razor blade   Hemostasis achieved with: aluminum chloride   Outcome: patient tolerated procedure well   Post-procedure details: wound care instructions given   Additional details:  Mupirocin and a bandage applied  Specimen 2 - Surgical pathology Differential Diagnosis: r/o SCC  Check Margins: No 1.2cm scaly pink plaque  Right chest  Skin / nail biopsy Type of biopsy: tangential   Informed consent: discussed and consent obtained   Timeout: patient name, date of birth, surgical site, and procedure verified   Patient was prepped and draped in usual sterile fashion: Area prepped with isopropyl alcohol. Anesthesia: the lesion was anesthetized in a standard fashion   Anesthetic:  1% lidocaine w/ epinephrine 1-100,000 buffered w/ 8.4% NaHCO3 Instrument used: flexible razor blade   Hemostasis achieved with: aluminum chloride   Outcome: patient tolerated procedure well   Post-procedure details: wound care instructions given   Additional details:  Mupirocin and a bandage applied  Specimen 3 - Surgical pathology Differential Diagnosis: r/o BCC vs SCC  Check Margins: No 0.7cm scaly pink papule  AK (actinic keratosis) (4) Upper  mid back x 1, left post ear x 1, left helix x 1, right upper arm x 1  Prior to procedure, discussed risks of blister formation, small wound, skin dyspigmentation, or rare scar following cryotherapy.    Destruction of lesion - Upper mid back x 1, left post ear x 1, left helix x 1, right upper arm x 1 Complexity: simple   Destruction method: cryotherapy   Informed consent:  discussed and consent obtained   Lesion destroyed using liquid nitrogen: Yes   Cryotherapy cycles:  2 Outcome: patient tolerated procedure well with no complications   Post-procedure details: wound care instructions given    Hemangiomas - Red papules - Discussed benign nature - Observe - Call for any changes  Actinic Damage - Severe, chronic, secondary to cumulative UV radiation exposure over time - diffuse scaly erythematous macules and papules with underlying dyspigmentation - Discussed Prescription "Field Treatment" for Severe, Chronic Confluent Actinic Changes with Pre-Cancerous Actinic Keratoses Field treatment involves treatment of an entire area of skin that has confluent Actinic Changes (Sun/ Ultraviolet light damage) and PreCancerous Actinic Keratoses by method of PhotoDynamic Therapy (PDT) and/or prescription Topical Chemotherapy agents such as 5-fluorouracil, 5-fluorouracil/calcipotriene, and/or imiquimod.  The purpose is to decrease the number of clinically evident and subclinical PreCancerous lesions to prevent progression to development of skin cancer by chemically destroying early precancer changes that may or may not be visible.  It has been shown to reduce the risk of developing skin cancer in the treated area. As a result of treatment, redness, scaling, crusting, and open sores may occur during treatment course. One or more than one of these methods may be used and may have to be used several times to control, suppress and eliminate the PreCancerous changes. Discussed treatment course, expected reaction, and possible side effects. - Recommend daily broad spectrum sunscreen SPF 30+ to sun-exposed areas, reapply every 2 hours as needed.  - Call for new or changing lesions. - After discussion of treatment options as above, patient defers photodynamic therapy or 5-fluorouracil prescription cream today. Will discuss again at follow-up  Return 4-6 weeks, for AK follow up, discuss  PDT.  Graciella Belton, RMA, am acting as scribe for Forest Gleason, MD .  Documentation: I have reviewed the above documentation for accuracy and completeness, and I agree with the above.  Forest Gleason, MD

## 2020-12-17 NOTE — Patient Instructions (Addendum)
Wound Care Instructions  1. Cleanse wound gently with soap and water once a day then pat dry with clean gauze. Apply a thing coat of Petrolatum (petroleum jelly, "Vaseline") over the wound (unless you have an allergy to this). We recommend that you use a new, sterile tube of Vaseline. Do not pick or remove scabs. Do not remove the yellow or white "healing tissue" from the base of the wound.  2. Cover the wound with fresh, clean, nonstick gauze and secure with paper tape. You may use Band-Aids in place of gauze and tape if the would is small enough, but would recommend trimming much of the tape off as there is often too much. Sometimes Band-Aids can irritate the skin.  3. You should call the office for your biopsy report after 1 week if you have not already been contacted.  4. If you experience any problems, such as abnormal amounts of bleeding, swelling, significant bruising, significant pain, or evidence of infection, please call the office immediately.  5. FOR ADULT SURGERY PATIENTS: If you need something for pain relief you may take 1 extra strength Tylenol (acetaminophen) AND 2 Ibuprofen (200mg each) together every 4 hours as needed for pain. (do not take these if you are allergic to them or if you have a reason you should not take them.) Typically, you may only need pain medication for 1 to 3 days.    Melanoma ABCDEs  Melanoma is the most dangerous type of skin cancer, and is the leading cause of death from skin disease.  You are more likely to develop melanoma if you:  Have light-colored skin, light-colored eyes, or red or blond hair  Spend a lot of time in the sun  Tan regularly, either outdoors or in a tanning bed  Have had blistering sunburns, especially during childhood  Have a close family member who has had a melanoma  Have atypical moles or large birthmarks  Early detection of melanoma is key since treatment is typically straightforward and cure rates are extremely high if  we catch it early.   The first sign of melanoma is often a change in a mole or a new dark spot.  The ABCDE system is a way of remembering the signs of melanoma.  A for asymmetry:  The two halves do not match. B for border:  The edges of the growth are irregular. C for color:  A mixture of colors are present instead of an even brown color. D for diameter:  Melanomas are usually (but not always) greater than 6mm - the size of a pencil eraser. E for evolution:  The spot keeps changing in size, shape, and color.  Please check your skin once per month between visits. You can use a small mirror in front and a large mirror behind you to keep an eye on the back side or your body.   If you see any new or changing lesions before your next follow-up, please call to schedule a visit.  Please continue daily skin protection including broad spectrum sunscreen SPF 30+ to sun-exposed areas, reapplying every 2 hours as needed when you're outdoors.   Cryotherapy Aftercare  . Wash gently with soap and water everyday.   . Apply Vaseline and Band-Aid daily until healed.  Prior to procedure, discussed risks of blister formation, small wound, skin dyspigmentation, or rare scar following cryotherapy.   

## 2020-12-18 ENCOUNTER — Encounter: Payer: Self-pay | Admitting: Physician Assistant

## 2020-12-18 ENCOUNTER — Ambulatory Visit: Payer: Medicare Other | Admitting: Physician Assistant

## 2020-12-18 ENCOUNTER — Other Ambulatory Visit
Admission: RE | Admit: 2020-12-18 | Discharge: 2020-12-18 | Disposition: A | Discharge status: Home / Self Care | Payer: Medicare Other | Source: Ambulatory Visit | Attending: Physician Assistant | Admitting: Physician Assistant

## 2020-12-18 VITALS — BP 94/60 | HR 70 | Ht 69.0 in | Wt 199.4 lb

## 2020-12-18 DIAGNOSIS — Z951 Presence of aortocoronary bypass graft: Secondary | ICD-10-CM | POA: Diagnosis not present

## 2020-12-18 DIAGNOSIS — K921 Melena: Secondary | ICD-10-CM

## 2020-12-18 DIAGNOSIS — I5020 Unspecified systolic (congestive) heart failure: Secondary | ICD-10-CM

## 2020-12-18 DIAGNOSIS — I10 Essential (primary) hypertension: Secondary | ICD-10-CM

## 2020-12-18 DIAGNOSIS — I25118 Atherosclerotic heart disease of native coronary artery with other forms of angina pectoris: Secondary | ICD-10-CM | POA: Insufficient documentation

## 2020-12-18 DIAGNOSIS — I255 Ischemic cardiomyopathy: Secondary | ICD-10-CM | POA: Diagnosis not present

## 2020-12-18 DIAGNOSIS — E785 Hyperlipidemia, unspecified: Secondary | ICD-10-CM

## 2020-12-18 DIAGNOSIS — I359 Nonrheumatic aortic valve disorder, unspecified: Secondary | ICD-10-CM

## 2020-12-18 DIAGNOSIS — Z952 Presence of prosthetic heart valve: Secondary | ICD-10-CM

## 2020-12-18 DIAGNOSIS — I739 Peripheral vascular disease, unspecified: Secondary | ICD-10-CM

## 2020-12-18 LAB — HEPATIC FUNCTION PANEL
ALT: 23 U/L (ref 0–44)
AST: 20 U/L (ref 15–41)
Albumin: 4.2 g/dL (ref 3.5–5.0)
Alkaline Phosphatase: 68 U/L (ref 38–126)
Bilirubin, Direct: 0.3 mg/dL — ABNORMAL HIGH (ref 0.0–0.2)
Indirect Bilirubin: 1.6 mg/dL — ABNORMAL HIGH (ref 0.3–0.9)
Total Bilirubin: 1.9 mg/dL — ABNORMAL HIGH (ref 0.3–1.2)
Total Protein: 6.9 g/dL (ref 6.5–8.1)

## 2020-12-18 LAB — LIPID PANEL
Cholesterol: 149 mg/dL (ref 0–200)
HDL: 49 mg/dL (ref >40)
LDL Cholesterol: 77 mg/dL (ref 0–99)
Total CHOL/HDL Ratio: 3 RATIO
Triglycerides: 116 mg/dL (ref <150)
VLDL: 23 mg/dL (ref 0–40)

## 2020-12-18 LAB — CBC
HCT: 43.2 % (ref 39.0–52.0)
Hemoglobin: 14.4 g/dL (ref 13.0–17.0)
MCH: 30.7 pg (ref 26.0–34.0)
MCHC: 33.3 g/dL (ref 30.0–36.0)
MCV: 92.1 fL (ref 80.0–100.0)
Platelets: 113 10*3/uL — ABNORMAL LOW (ref 150–400)
RBC: 4.69 MIL/uL (ref 4.22–5.81)
RDW: 12.8 % (ref 11.5–15.5)
WBC: 7.8 10*3/uL (ref 4.0–10.5)
nRBC: 0 % (ref 0.0–0.2)

## 2020-12-18 LAB — LDL CHOLESTEROL, DIRECT: Direct LDL: 83.3 mg/dL (ref 0–99)

## 2020-12-18 MED ORDER — CARVEDILOL 3.125 MG PO TABS: 3.1250 mg | ORAL_TABLET | Freq: Two times a day (BID) | ORAL | 3 refills | Status: DC

## 2020-12-18 NOTE — Patient Instructions (Signed)
Medication Instructions:  Your physician has recommended you make the following change in your medication:  1. DECREASE Carvedilol to 3.125 mg twice a day  *If you need a refill on your cardiac medications before your next appointment, please call your pharmacy*   Lab Work: CBC, Lipid panel, Direct LDL, Liver function panel  Go to [facility] Entrance on your way out and check in at registration to have these done.   If you have labs (blood work) drawn today and your tests are completely normal, you will receive your results only by: Marland Kitchen MyChart Message (if you have MyChart) OR . A paper copy in the mail If you have any lab test that is abnormal or we need to change your treatment, we will call you to review the results.   Testing/Procedures: None   Follow-Up: At [facility], you and your health needs are our priority.  As part of our continuing mission to provide you with exceptional heart care, we have created designated Provider Care Teams.  These Care Teams include your primary Cardiologist (physician) and Advanced Practice Providers (APPs -  Physician Assistants and Nurse Practitioners) who all work together to provide you with the care you need, when you need it.   Your next appointment:   4 month(s)  The format for your next appointment:   In Person  Provider:   [name], MD

## 2020-12-19 ENCOUNTER — Telehealth: Payer: Self-pay | Admitting: *Deleted

## 2020-12-19 DIAGNOSIS — I25118 Atherosclerotic heart disease of native coronary artery with other forms of angina pectoris: Secondary | ICD-10-CM

## 2020-12-19 MED ORDER — EZETIMIBE 10 MG PO TABS: 10.0000 mg | ORAL_TABLET | Freq: Every day | ORAL | 3 refills | Status: DC

## 2020-12-19 NOTE — Telephone Encounter (Signed)
Patient's daughter is returning your call 

## 2020-12-19 NOTE — Telephone Encounter (Signed)
Left voicemail message to call back for results and recommendations. 

## 2020-12-19 NOTE — Telephone Encounter (Signed)
-----   Message from Rise Mu, PA-C sent at 12/19/2020  8:36 AM EST ----- Please inform patient and daughter (call both) blood count is normal.  Platelet count is low though stable and improved when compared to prior and baseline.  Liver enzymes related to statin are normal.  Bilirubin is mildly elevated though this is not new for him.  LDL (bad cholesterol) is improved from last check in 10/2020 though does remain slightly above goal of 70.  Add Zetia 10 mg daily.  Follow-up fasting lipid and liver function in 2 months.

## 2020-12-19 NOTE — Telephone Encounter (Signed)
Spoke with patients daughter per release form. Reviewed results and recommendations with her and confirmed pharmacy to send in prescription. Also discussed repeat labs needed in 2 months which requires that he not eat or drink anything after midnight except water with his medications. She did request that I mail lab slips to his address which I will place in outgoing mail. She verbalized understanding of all instructions with no further questions at this time. Inquired if she wanted me to call him as well and she said that she would update him instead. Advised to please call back if any further questions.

## 2020-12-23 NOTE — Progress Notes (Signed)
1. Skin , right angle of mandible SQUAMOUS CELL CARCINOMA IN SITU, BASE INVOLVED  --> ED&C  2. 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 Skin , right chest SQUAMOUS CELL CARCINOMA IN SITU  --> ED&C  Already scheduled in February. Will treat then.  Reviewed with wife and daughter as requested. All questions answered.

## 2021-01-06 ENCOUNTER — Encounter: Payer: Self-pay | Admitting: Dermatology

## 2021-01-09 MED ORDER — ENTRESTO 97-103 MG PO TABS: 0.5000 | ORAL_TABLET | Freq: Two times a day (BID) | ORAL | 1 refills | Status: DC

## 2021-01-13 ENCOUNTER — Other Ambulatory Visit: Payer: Self-pay | Admitting: Internal Medicine

## 2021-01-13 ENCOUNTER — Other Ambulatory Visit: Payer: Self-pay | Admitting: Cardiovascular Disease

## 2021-01-14 NOTE — Telephone Encounter (Signed)
Rx request sent to pharmacy.  

## 2021-01-14 NOTE — Telephone Encounter (Signed)
This is a [place] pt 

## 2021-01-22 ENCOUNTER — Other Ambulatory Visit: Payer: Self-pay

## 2021-01-22 ENCOUNTER — Ambulatory Visit: Payer: Medicare Other | Admitting: Dermatology

## 2021-01-22 DIAGNOSIS — L570 Actinic keratosis: Secondary | ICD-10-CM

## 2021-01-22 DIAGNOSIS — Z872 Personal history of diseases of the skin and subcutaneous tissue: Secondary | ICD-10-CM | POA: Diagnosis not present

## 2021-01-22 DIAGNOSIS — D0461 Carcinoma in situ of skin of right upper limb, including shoulder: Secondary | ICD-10-CM

## 2021-01-22 DIAGNOSIS — D099 Carcinoma in situ, unspecified: Secondary | ICD-10-CM

## 2021-01-22 DIAGNOSIS — L578 Other skin changes due to chronic exposure to nonionizing radiation: Secondary | ICD-10-CM | POA: Diagnosis not present

## 2021-01-22 DIAGNOSIS — D045 Carcinoma in situ of skin of trunk: Secondary | ICD-10-CM

## 2021-01-22 DIAGNOSIS — D0439 Carcinoma in situ of skin of other parts of face: Secondary | ICD-10-CM | POA: Diagnosis not present

## 2021-01-22 MED ORDER — MUPIROCIN 2 % EX OINT: 1.0000 "application " | TOPICAL_OINTMENT | Freq: Every day | CUTANEOUS | 1 refills | Status: DC

## 2021-01-22 NOTE — Progress Notes (Signed)
Follow-Up Visit   Subjective  Matthew [name] is a 85 y.o. male who presents for the following: Follow-up (Patient here today for 4 week AK follow up. ) and Procedure (Patient here today to treat bx proven SCCis x 3 with EDC. ).  Patient accompanied by daughter.   The following portions of the chart were reviewed this encounter and updated as appropriate:   Tobacco  Allergies  Meds  Problems  Med Hx  Surg Hx  Fam Hx      Review of Systems:  No other skin or systemic complaints except as noted in HPI or Assessment and Plan.  Objective  Well appearing patient in no apparent distress; mood and affect are within normal limits.  A focused examination was performed including face, neck, chest and back and legs, arms. Relevant physical exam findings are noted in the Assessment and Plan.  Objective  Left Anterior Thigh x 1, left postauricular x 1, right helix x 2, right mid back x 1 (5): Erythematous thin papules/macules with gritty scale.   Objective  Right chest: Healing bx site  Objective  Right angle of mandible: Healing bx site  Objective  Right upper arm: Healing bx site    Assessment & Plan  AK (actinic keratosis) (5) Left Anterior Thigh x 1, left postauricular x 1, right helix x 2, right mid back x 1  Prior to procedure, discussed risks of blister formation, small wound, skin dyspigmentation, or rare scar following cryotherapy.  Prior to procedure, discussed risks of blister formation, small wound, skin dyspigmentation, or rare scar following cryotherapy.    Destruction of lesion - Left Anterior Thigh x 1, left postauricular x 1, right helix x 2, right mid back x 1 Complexity: simple   Destruction method: cryotherapy   Informed consent: discussed and consent obtained   Lesion destroyed using liquid nitrogen: Yes   Cryotherapy cycles:  2 Outcome: patient tolerated procedure well with no complications   Post-procedure details: wound care instructions given     Squamous cell carcinoma in situ (3) Right chest  Destruction of lesion  Destruction method: electrodesiccation and curettage   Informed consent: discussed and consent obtained   Timeout:  patient name, date of birth, surgical site, and procedure verified Patient was prepped and draped in usual sterile fashion: area prepped with isopropyl alcohol. Anesthesia: the lesion was anesthetized in a standard fashion   Anesthetic:  1% lidocaine w/ epinephrine 1-100,000 buffered w/ 8.4% NaHCO3 Curettage performed in three different directions: Yes   Electrodesiccation performed over the curetted area: Yes   Curettage cycles:  3 Final wound size (cm):  2.1 Hemostasis achieved with:  electrodesiccation Outcome: patient tolerated procedure well with no complications   Post-procedure details: wound care instructions given   Additional details:  Mupirocin and a pressure dressing applied  Right angle of mandible  Destruction of lesion  Destruction method: electrodesiccation and curettage   Informed consent: discussed and consent obtained   Timeout:  patient name, date of birth, surgical site, and procedure verified Patient was prepped and draped in usual sterile fashion: area prepped with isopropyl alcohol. Anesthesia: the lesion was anesthetized in a standard fashion   Anesthetic:  1% lidocaine w/ epinephrine 1-100,000 buffered w/ 8.4% NaHCO3 Curettage performed in three different directions: Yes   Electrodesiccation performed over the curetted area: Yes   Curettage cycles:  3 Final wound size (cm):  1.5 Hemostasis achieved with:  electrodesiccation Outcome: patient tolerated procedure well with no complications   Post-procedure  details: wound care instructions given   Additional details:  Mupirocin and a pressure dressing applied  Right upper arm  Destruction of lesion  Destruction method: electrodesiccation and curettage   Informed consent: discussed and consent obtained   Timeout:   patient name, date of birth, surgical site, and procedure verified Patient was prepped and draped in usual sterile fashion: area prepped with isopropyl alcohol. Anesthesia: the lesion was anesthetized in a standard fashion   Anesthetic:  1% lidocaine w/ epinephrine 1-100,000 buffered w/ 8.4% NaHCO3 Curettage performed in three different directions: Yes   Electrodesiccation performed over the curetted area: Yes   Curettage cycles:  3 Final wound size (cm):  2.2 Hemostasis achieved with:  electrodesiccation Outcome: patient tolerated procedure well with no complications   Post-procedure details: wound care instructions given   Additional details:  Mupirocin and a pressure dressing applied  Start mupirocin daily with dressing changes.   Ordered Medications: mupirocin ointment (BACTROBAN) 2 %  History of PreCancerous Actinic Keratosis  - sites of PreCancerous Actinic Keratosis clear today. - these may recur and new lesions may form requiring treatment to prevent transformation into skin cancer - observe for new or changing spots and contact [place] for appointment if occur - photoprotection with sun protective clothing; sunglasses and broad spectrum sunscreen with SPF of at least 30 + and frequent self skin exams recommended - yearly exams by a dermatologist recommended for persons with history of PreCancerous Actinic Keratoses  Actinic Damage - chronic, secondary to cumulative UV radiation exposure/sun exposure over time - diffuse scaly erythematous macules with underlying dyspigmentation - Recommend daily broad spectrum sunscreen SPF 30+ to sun-exposed areas, reapply every 2 hours as needed.  - Call for new or changing lesions.  Return in about 3 months (around 04/21/2021) for TBSE, AK follow up.  Graciella Belton, RMA, am acting as scribe for Forest Gleason, MD .  Documentation: I have reviewed the above documentation for accuracy and completeness, and I agree with the  above.  Forest Gleason, MD

## 2021-01-22 NOTE — Patient Instructions (Addendum)
Wound Care Instructions  1. Cleanse wound gently with soap and water once a day then pat dry with clean gauze. Apply a thing coat of Petrolatum (petroleum jelly, "Vaseline") over the wound (unless you have an allergy to this). We recommend that you use a new, sterile tube of Vaseline. Do not pick or remove scabs. Do not remove the yellow or white "healing tissue" from the base of the wound.  2. Cover the wound with fresh, clean, nonstick gauze and secure with paper tape. You may use Band-Aids in place of gauze and tape if the would is small enough, but would recommend trimming much of the tape off as there is often too much. Sometimes Band-Aids can irritate the skin.  3. You should call the office for your biopsy report after 1 week if you have not already been contacted.  4. If you experience any problems, such as abnormal amounts of bleeding, swelling, significant bruising, significant pain, or evidence of infection, please call the office immediately.  5. FOR ADULT SURGERY PATIENTS: If you need something for pain relief you may take 1 extra strength Tylenol (acetaminophen) AND 2 Ibuprofen (200mg  each) together every 4 hours as needed for pain. (do not take these if you are allergic to them or if you have a reason you should not take them.) Typically, you may only need pain medication for 1 to 3 days.    Cryotherapy Aftercare  . Wash gently with soap and water everyday.   Marland Kitchen Apply Vaseline and Band-Aid daily until healed.  Prior to procedure, discussed risks of blister formation, small wound, skin dyspigmentation, or rare scar following cryotherapy.    Recommend taking Heliocare sun protection supplement daily in sunny weather for additional sun protection. For maximum protection on the sunniest days, you can take up to 2 capsules of regular Heliocare OR take 1 capsule of Heliocare Ultra. For prolonged exposure (such as a full day in the sun), you can repeat your dose of the supplement 4 hours  after your first dose. Heliocare can be purchased at [facility] or at [email or web address].

## 2021-01-26 ENCOUNTER — Inpatient Hospital Stay: Payer: Medicare Other | Attending: Oncology

## 2021-01-26 ENCOUNTER — Encounter: Payer: Self-pay | Admitting: Dermatology

## 2021-01-26 ENCOUNTER — Inpatient Hospital Stay: Payer: Medicare Other | Admitting: Oncology

## 2021-01-26 VITALS — BP 154/72 | HR 67 | Temp 97.7°F | Resp 18 | Wt 203.8 lb

## 2021-01-26 DIAGNOSIS — D696 Thrombocytopenia, unspecified: Secondary | ICD-10-CM | POA: Diagnosis not present

## 2021-01-26 DIAGNOSIS — D509 Iron deficiency anemia, unspecified: Secondary | ICD-10-CM

## 2021-01-26 DIAGNOSIS — E538 Deficiency of other specified B group vitamins: Secondary | ICD-10-CM

## 2021-01-26 LAB — CBC WITH DIFFERENTIAL/PLATELET
Abs Immature Granulocytes: 0.04 10*3/uL (ref 0.00–0.07)
Basophils Absolute: 0.1 10*3/uL (ref 0.0–0.1)
Basophils Relative: 1 %
Eosinophils Absolute: 0.2 10*3/uL (ref 0.0–0.5)
Eosinophils Relative: 3 %
HCT: 44.8 % (ref 39.0–52.0)
Hemoglobin: 15 g/dL (ref 13.0–17.0)
Immature Granulocytes: 1 %
Lymphocytes Relative: 19 %
Lymphs Abs: 1.3 10*3/uL (ref 0.7–4.0)
MCH: 30.8 pg (ref 26.0–34.0)
MCHC: 33.5 g/dL (ref 30.0–36.0)
MCV: 92 fL (ref 80.0–100.0)
Monocytes Absolute: 0.5 10*3/uL (ref 0.1–1.0)
Monocytes Relative: 7 %
Neutro Abs: 4.8 10*3/uL (ref 1.7–7.7)
Neutrophils Relative %: 69 %
Platelets: 93 10*3/uL — ABNORMAL LOW (ref 150–400)
RBC: 4.87 MIL/uL (ref 4.22–5.81)
RDW: 12.9 % (ref 11.5–15.5)
WBC: 6.8 10*3/uL (ref 4.0–10.5)
nRBC: 0 % (ref 0.0–0.2)

## 2021-01-26 LAB — IRON AND TIBC
Iron: 86 ug/dL (ref 45–182)
Saturation Ratios: 29 % (ref 17.9–39.5)
TIBC: 293 ug/dL (ref 250–450)
UIBC: 207 ug/dL

## 2021-01-26 LAB — FERRITIN: Ferritin: 100 ng/mL (ref 24–336)

## 2021-01-26 LAB — VITAMIN B12: Vitamin B-12: 862 pg/mL (ref 180–914)

## 2021-01-26 NOTE — Progress Notes (Signed)
Hematology/Oncology Consult note [facility]  Telephone:([phone] [phone]  Patient Care Team: Antionette Char, MD as PCP - General (Family Medicine) Wellington Hampshire, MD as PCP - Cardiology (Cardiology) Deboraha Sprang, MD as PCP - Electrophysiology (Cardiology) Wellington Hampshire, MD as Consulting Physician (Cardiology) Daneil Dolin, MD as Consulting Physician (Gastroenterology)   Name of the patient: Matthew [name]  [id number]  Jan 19, 1936   Date of visit: 01/26/21  Diagnosis-iron deficiency anemia  Chief complaint/ Reason for visit-routine follow-up of iron deficiency anemia  Heme/Onc history: patient is a 85 year old male with past medical history significant for severe aortic stenosis, mild thrombocytopenia among other medical problems. He was recently admitted to the hospital for acute symptomatic anemia from upper GI bleed when his hemoglobin dropped down from 10.6-7. Labs indicated severe iron deficiency. He underwent EGD and colonoscopy. Upper endoscopy showed several gastric AVMs which were treated with APC. 2 cm hiatal hernia was also noted. Colonoscopy showed a large flat polyp in the cecum that was not resected due to poor prep. Patient has seen Dr. Marius Ditch who plans to do a repeat colonoscopy in January 2020. Patient was also found to have low B12 levels of 128.Patient received B12 shots as well as IV iron with normalization of his hemoglobin   Interval history-patient reports doing well and denies any complaints at this time.He has not had any recent hospitalizations.  He does have coronary artery disease and follows up with Dr. Cindee Lame.  ECOG PS- 1 Pain scale- 0   Review of systems- ROS   Allergies  Allergen Reactions  . Ranexa [Ranolazine Er] Rash     Past Medical History:  Diagnosis Date  . Alcohol abuse    stopped drinking 42 years ago from today 2020  . Anemia   . AS (aortic stenosis)    a. Echo 6/10: EF  55% mild AS; b. echo 06/2015; EF 55-60%, GR1DD, moderate AS, Peak velocity (S): 346 cm/s. Mean gradientS): 28 mm Hg. Peak gradient (S): 48 mm Hg. Valve area (VTI): 1.18 cm2;   c. Echo 4/17 - mild LVH, EF 55-60%, no RWMA, Gr 1 DD, mod to severe AS (mean 28 mmHg, peak 46 mmHg), mild LAE  . CAD (coronary artery disease)    a. MI 1996 w/ CABG 1996; b.redo CABG 06/2003; c. Myoview 7/09: EF 54% inferobasal infarct, no ischemia. Myoview 6/10 EF 43% inf wall infarct. no ischemia; c. cath 8/16 s/p DES to VG-OM2. OTW 3VD w/ patent VG->RPDA, VG->OM3,  & LIMA->LAD.  EF 55-65%.  . Carotid bruit    2009 0-39% on dopplers bilatrally  . Chest pain   . GERD (gastroesophageal reflux disease)   . History of kidney stones   . HOH (hard of hearing)   . HTN (hypertension)   . Hyperlipidemia   . Myocardial infarction ([place]) 1996  . Nephrolithiasis   . OSA (obstructive sleep apnea)   . PAD (peripheral artery disease) ([place]) 02/2014   Subtotal occlusion of right common iliac artery and 70% stenosis in the left common iliac artery. Status post bilateral kissing stent placement. 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 Shortness of breath    with exertion  . Thrombocytopenia ([place])      Past Surgical History:  Procedure Laterality Date  . ABDOMINAL AORTAGRAM N/A 03/06/2014   Procedure:  ABDOMINAL AORTAGRAM;  Surgeon: Wellington Hampshire, MD;  Location: [facility] CATH LAB;  Service: Cardiovascular;  Laterality: N/A;  . BIV PACEMAKER INSERTION CRT-P N/A 07/23/2019   Procedure: BIV PACEMAKER INSERTION CRT-P;  Surgeon: Deboraha Sprang, MD;  Location: [place] CV LAB;  Service: Cardiovascular;  Laterality: N/A;  . CARDIAC CATHETERIZATION  02/2014   Severe three-vessel coronary artery disease with patent grafts.   Marland Kitchen CARDIAC CATHETERIZATION N/A 07/31/2015   Procedure:  Right/Left Heart Cath and Coronary/Graft Angiography;  Surgeon: Wellington Hampshire, MD;  Location: [place] CV LAB;  Service: Cardiovascular;  Laterality: N/A;  . CARDIAC CATHETERIZATION N/A 07/31/2015   Procedure: Coronary Stent Intervention;  Surgeon: Wellington Hampshire, MD;  Location: [place] CV LAB;  Service: Cardiovascular;  Laterality: N/A;  . CARDIAC CATHETERIZATION N/A 05/26/2016   Procedure: Right/Left Heart Cath and Coronary Angiography;  Surgeon: Wellington Hampshire, MD;  Location: [place] CV LAB;  Service: Cardiovascular;  Laterality: N/A;  . COLONOSCOPY WITH PROPOFOL N/A 10/07/2018   Procedure: COLONOSCOPY WITH PROPOFOL;  Surgeon: Lin Landsman, MD;  Location: [facility] ENDOSCOPY;  Service: Gastroenterology;  Laterality: N/A;  . COLONOSCOPY WITH PROPOFOL N/A 01/08/2019   Procedure: COLONOSCOPY WITH PROPOFOL;  Surgeon: Lin Landsman, MD;  Location: [facility] ENDOSCOPY;  Service: Gastroenterology;  Laterality: N/A;  . CORONARY ARTERY BYPASS GRAFT  1991   at [facility]. Redone 2004-3 vessels 1st time and 4 second time  . CYSTO     2/3 times for kidney stones  . ENTEROSCOPY N/A 12/25/2018   Procedure: ENTEROSCOPY Single balloon;  Surgeon: Lin Landsman, MD;  Location: [place];  Service: Gastroenterology;  Laterality: N/A;  . ESOPHAGOGASTRODUODENOSCOPY (EGD) WITH PROPOFOL N/A 10/07/2018   Procedure: ESOPHAGOGASTRODUODENOSCOPY (EGD) WITH PROPOFOL;  Surgeon: Lin Landsman, MD;  Location: [facility] ENDOSCOPY;  Service: Gastroenterology;  Laterality: N/A;  . GIVENS CAPSULE STUDY N/A 12/22/2018   Procedure: GIVENS CAPSULE STUDY;  Surgeon: Lin Landsman, MD;  Location: [facility] ENDOSCOPY;  Service: Gastroenterology;  Laterality: N/A;  . INGUINAL HERNIA REPAIR  11/10/2011   Procedure: HERNIA REPAIR INGUINAL ADULT;  Surgeon: Donato Heinz;  Location: [facility] ORS;  Service: General;  Laterality: Right;  . LAPAROSCOPIC RIGHT COLECTOMY Right 02/09/2019   Procedure: LAPAROSCOPIC RIGHT  COLECTOMY;  Surgeon: Jules Husbands, MD;  Location: [facility] ORS;  Service: General;  Laterality: Right;  . LEFT HEART CATH AND CORONARY ANGIOGRAPHY Left 05/15/2018   Procedure: LEFT HEART CATH AND CORONARY ANGIOGRAPHY;  Surgeon: Wellington Hampshire, MD;  Location: [place] CV LAB;  Service: Cardiovascular;  Laterality: Left;  . LEFT HEART CATHETERIZATION WITH CORONARY/GRAFT ANGIOGRAM N/A 03/06/2014   Procedure: LEFT HEART CATHETERIZATION WITH Beatrix Fetters;  Surgeon: Wellington Hampshire, MD;  Location: [place] CATH LAB;  Service: Cardiovascular;  Laterality: N/A;  . RIGHT HEART CATH AND CORONARY/GRAFT ANGIOGRAPHY N/A 09/05/2020   Procedure: RIGHT HEART CATH AND CORONARY/GRAFT ANGIOGRAPHY;  Surgeon: Wellington Hampshire, MD;  Location: [place] CV LAB;  Service: Cardiovascular;  Laterality: N/A;  . STOMACH SURGERY     removal of gastric ulcers  . TEE WITHOUT CARDIOVERSION N/A 06/01/2016   Procedure: TRANSESOPHAGEAL ECHOCARDIOGRAM (TEE);  Surgeon: Sherren Mocha, MD;  Location: [place];  Service: Open Heart Surgery;  Laterality: N/A;  . TRANSCATHETER AORTIC VALVE REPLACEMENT, TRANSFEMORAL N/A 06/01/2016   Procedure: TRANSCATHETER AORTIC VALVE REPLACEMENT, TRANSFEMORAL;  Surgeon: Sherren Mocha, MD;  Location: [place];  Service: Open Heart Surgery;  Laterality: N/A;    Social History   Socioeconomic History  .  Marital status: Married    Spouse name: Not on file  . Number of children: 2  . Years of education: Not on file  . Highest education level: Not on file  Occupational History  . Occupation: Retired  Tobacco Use  . Smoking status: Former Smoker    Packs/day: 1.00    Years: 50.00    Pack years: 50.00    Types: Cigarettes    Quit date: 01/24/1990    Years since quitting: 31.0  . Smokeless tobacco: Never Used  Vaping Use  . Vaping Use: Never used  Substance and Sexual Activity  . Alcohol use: No    Alcohol/week: 0.0 standard drinks    Comment: Alcoholic in past  . Drug use: No  .  Sexual activity: Never  Other Topics Concern  . Not on file  Social History Narrative   Married an lives with wife in [place]. Retired form [employer].    Social Determinants of Health   Financial Resource Strain: Not on file  Food Insecurity: Not on file  Transportation Needs: Not on file  Physical Activity: Not on file  Stress: Not on file  Social Connections: Not on file  Intimate Partner Violence: Not on file    Family History  Problem Relation Age of Onset  . Heart disease Father   . Heart disease Mother   . Colon cancer Neg Hx      Current Outpatient Medications:  .  acetaminophen (TYLENOL) 325 MG tablet, Take 650 mg by mouth every 6 (six) hours as needed for mild pain or headache. , Disp: , Rfl:  .  aspirin EC 81 MG tablet, Take 1 tablet (81 mg total) by mouth daily., Disp: 90 tablet, Rfl: 3 .  atorvastatin (LIPITOR) 80 MG tablet, TAKE 1 TABLET BY MOUTH  DAILY AT 6 PM, Disp: 90 tablet, Rfl: 3 .  carvedilol (COREG) 3.125 MG tablet, Take 1 tablet (3.125 mg total) by mouth 2 (two) times daily., Disp: 180 tablet, Rfl: 3 .  ENTRESTO 97-103 MG, TAKE ONE-HALF TABLET BY  MOUTH TWICE DAILY, Disp: 90 tablet, Rfl: 3 .  ezetimibe (ZETIA) 10 MG tablet, Take 1 tablet (10 mg total) by mouth daily., Disp: 90 tablet, Rfl: 3 .  isosorbide mononitrate (IMDUR) 60 MG 24 hr tablet, TAKE 1 TABLET BY MOUTH  DAILY, Disp: 90 tablet, Rfl: 3 .  Multiple Vitamins-Minerals (PRESERVISION AREDS PO), Take 1 tablet by mouth daily. , Disp: , Rfl:  .  mupirocin ointment (BACTROBAN) 2 %, Apply 1 application topically daily., Disp: 22 g, Rfl: 1 .  pantoprazole (PROTONIX) 40 MG tablet, TAKE 1 TABLET BY MOUTH  DAILY, Disp: 90 tablet, Rfl: 3 .  tamsulosin (FLOMAX) 0.4 MG CAPS capsule, Take 1 capsule (0.4 mg total) by mouth daily. Future refill request will need to be sent to the pcp, Disp: 90 capsule, Rfl: 1 .  tetrahydrozoline 0.05 % ophthalmic solution, Place 2 drops into both eyes 4 (four) times  daily as needed (for dry eyes)., Disp: , Rfl:  .  TURMERIC PO, Take 1 capsule by mouth at bedtime. , Disp: , Rfl:  .  vitamin B-12 (CYANOCOBALAMIN) 1000 MCG tablet, Take 1,000 mcg by mouth daily., Disp: , Rfl:  .  potassium chloride SA (KLOR-CON M20) 20 MEQ tablet, Take 1 tablet (20 mEq total) by mouth daily., Disp: 90 tablet, Rfl: 2 No current facility-administered medications for this visit.  Facility-Administered Medications Ordered in Other Visits:  .  cyanocobalamin ((VITAMIN B-12)) injection 1,000 mcg, 1,000  mcg, Intramuscular, Weekly, Sindy Guadeloupe, MD, 1,000 mcg at 02/25/20 1447  Physical exam:  Vitals:   01/26/21 1109  BP: (!) 154/72  Pulse: 67  Resp: 18  Temp: 97.7 F (36.5 C)  TempSrc: Oral  SpO2: 97%  Weight: 203 lb 12.8 oz (92.4 kg)   Physical Exam Eyes:     Extraocular Movements: EOM normal.  Cardiovascular:     Rate and Rhythm: Normal rate and regular rhythm.     Heart sounds: Normal heart sounds.  Pulmonary:     Effort: Pulmonary effort is normal.  Skin:    General: Skin is warm and dry.  Neurological:     Mental Status: He is alert and oriented to person, place, and time.      CMP Latest Ref Rng & Units 12/18/2020  Glucose 70 - 99 mg/dL -  BUN 8 - 23 mg/dL -  Creatinine 0.61 - 1.24 mg/dL -  Sodium 135 - 145 mmol/L -  Potassium 3.5 - 5.1 mmol/L -  Chloride 98 - 111 mmol/L -  CO2 22 - 32 mmol/L -  Calcium 8.9 - 10.3 mg/dL -  Total Protein 6.5 - 8.1 g/dL 6.9  Total Bilirubin 0.3 - 1.2 mg/dL 1.9(H)  Alkaline Phos 38 - 126 U/L 68  AST 15 - 41 U/L 20  ALT 0 - 44 U/L 23   CBC Latest Ref Rng & Units 01/26/2021  WBC 4.0 - 10.5 K/uL 6.8  Hemoglobin 13.0 - 17.0 g/dL 15.0  Hematocrit 39.0 - 52.0 % 44.8  Platelets 150 - 400 K/uL 93(L)     Assessment and plan- Patient is a 85 y.o. male with iron deficiency anemia and thrombocytopenia here for routine follow-up  Iron deficiency anemia: Patient's hemoglobin is presently stable between 13-15 and he has not  required any IV iron for over a year now.  Iron studies from today are normal.  Repeat CBC ferritin and iron studies in 6 months and 1 year and I will see him back in 1 year.  Thrombocytopenia: Remained stable over the last 2 years and his platelet counts fluctuate between 90s 200s.  Continue to monitor   Visit Diagnosis 1. Iron deficiency anemia, unspecified iron deficiency anemia type   2. Thrombocytopenia ([place])      Dr. Randa Evens, MD, MPH [facility] at [facility] [phone] 01/26/2021 1:41 PM

## 2021-01-27 ENCOUNTER — Ambulatory Visit (INDEPENDENT_AMBULATORY_CARE_PROVIDER_SITE_OTHER): Payer: Medicare Other

## 2021-01-27 DIAGNOSIS — I255 Ischemic cardiomyopathy: Secondary | ICD-10-CM

## 2021-01-28 ENCOUNTER — Encounter: Payer: Self-pay | Admitting: Dermatology

## 2021-01-28 LAB — CUP PACEART REMOTE DEVICE CHECK
Battery Remaining Longevity: 80 mo
Battery Remaining Percentage: 95.5 %
Battery Voltage: 2.99 V
Brady Statistic AP VP Percent: 84 %
Brady Statistic AP VS Percent: 1 %
Brady Statistic AS VP Percent: 11 %
Brady Statistic AS VS Percent: 1 %
Brady Statistic RA Percent Paced: 81 %
Date Time Interrogation Session: 20220215232914
Implantable Lead Implant Date: 20200810
Implantable Lead Implant Date: 20200810
Implantable Lead Implant Date: 20200810
Implantable Lead Location: 753858
Implantable Lead Location: 753859
Implantable Lead Location: 753860
Implantable Lead Model: 5076
Implantable Lead Model: 5076
Implantable Pulse Generator Implant Date: 20200810
Lead Channel Impedance Value: 430 Ohm
Lead Channel Impedance Value: 460 Ohm
Lead Channel Impedance Value: 600 Ohm
Lead Channel Pacing Threshold Amplitude: 0.75 V
Lead Channel Pacing Threshold Amplitude: 1 V
Lead Channel Pacing Threshold Amplitude: 1 V
Lead Channel Pacing Threshold Pulse Width: 0.4 ms
Lead Channel Pacing Threshold Pulse Width: 0.4 ms
Lead Channel Pacing Threshold Pulse Width: 1 ms
Lead Channel Sensing Intrinsic Amplitude: 1.3 mV
Lead Channel Sensing Intrinsic Amplitude: 12 mV
Lead Channel Setting Pacing Amplitude: 2 V
Lead Channel Setting Pacing Amplitude: 2 V
Lead Channel Setting Pacing Amplitude: 2 V
Lead Channel Setting Pacing Pulse Width: 0.4 ms
Lead Channel Setting Pacing Pulse Width: 1 ms
Lead Channel Setting Sensing Sensitivity: 2 mV
Pulse Gen Model: 3562
Pulse Gen Serial Number: 9130392

## 2021-02-02 NOTE — Progress Notes (Signed)
Remote pacemaker transmission.   

## 2021-03-20 NOTE — Telephone Encounter (Signed)
I gave the patient a monitor. I gave him his cell adapter back to hook up into the new monitor. I gave patient the number [employer] tech support to get additional help with pairing new monitor with pacemaker.

## 2021-04-08 ENCOUNTER — Other Ambulatory Visit: Payer: Self-pay | Admitting: Internal Medicine

## 2021-04-11 ENCOUNTER — Other Ambulatory Visit: Payer: Self-pay | Admitting: Internal Medicine

## 2021-04-14 ENCOUNTER — Other Ambulatory Visit: Payer: Self-pay | Admitting: Internal Medicine

## 2021-04-21 ENCOUNTER — Ambulatory Visit: Payer: Medicare Other | Admitting: Cardiovascular Disease

## 2021-04-23 ENCOUNTER — Ambulatory Visit: Payer: Medicare Other | Admitting: Cardiovascular Disease

## 2021-04-23 ENCOUNTER — Encounter: Payer: Self-pay | Admitting: Cardiovascular Disease

## 2021-04-23 ENCOUNTER — Other Ambulatory Visit: Payer: Self-pay

## 2021-04-23 VITALS — BP 142/70 | HR 70 | Ht 69.0 in | Wt 202.2 lb

## 2021-04-23 DIAGNOSIS — I739 Peripheral vascular disease, unspecified: Secondary | ICD-10-CM

## 2021-04-23 DIAGNOSIS — Z952 Presence of prosthetic heart valve: Secondary | ICD-10-CM | POA: Diagnosis not present

## 2021-04-23 DIAGNOSIS — I10 Essential (primary) hypertension: Secondary | ICD-10-CM

## 2021-04-23 DIAGNOSIS — I5022 Chronic systolic (congestive) heart failure: Secondary | ICD-10-CM | POA: Diagnosis not present

## 2021-04-23 DIAGNOSIS — I25708 Atherosclerosis of coronary artery bypass graft(s), unspecified, with other forms of angina pectoris: Secondary | ICD-10-CM | POA: Diagnosis not present

## 2021-04-23 DIAGNOSIS — E785 Hyperlipidemia, unspecified: Secondary | ICD-10-CM

## 2021-04-23 NOTE — Patient Instructions (Addendum)
Medication Instructions:  Your physician has recommended you make the following change in your medication:   STOP Lisinopril  Continue Imdur (isosorbide) 60 mg daily.  *If you need a refill on your cardiac medications before your next appointment, please call your pharmacy*   Lab Work: None ordered If you have labs (blood work) drawn today and your tests are completely normal, you will receive your results only by: Marland Kitchen MyChart Message (if you have MyChart) OR . A paper copy in the mail If you have any lab test that is abnormal or we need to change your treatment, we will call you to review the results.   Testing/Procedures: None ordered   Follow-Up: At [facility], you and your health needs are our priority.  As part of our continuing mission to provide you with exceptional heart care, we have created designated Provider Care Teams.  These Care Teams include your primary Cardiologist (physician) and Advanced Practice Providers (APPs -  Physician Assistants and Nurse Practitioners) who all work together to provide you with the care you need, when you need it.  We recommend signing up for the patient portal called "MyChart".  Sign up information is provided on this After Visit Summary.  MyChart is used to connect with patients for Virtual Visits (Telemedicine).  Patients are able to view lab/test results, encounter notes, upcoming appointments, etc.  Non-urgent messages can be sent to your provider as well.   To learn more about what you can do with MyChart, go to [email or web address].    Your next appointment:   Your physician wants you to follow-up in: 6 months You will receive a reminder letter in the mail two months in advance. If you don't receive a letter, please call our office to schedule the follow-up appointment.   The format for your next appointment:   In Person  Provider:   You may see [name], MD or one of the following Advanced Practice Providers on your  designated Care Team:    Murray Hodgkins, NP  Christell Faith, PA-C  Marrianne Mood, PA-C  Cadence [place], [place]  Laurann [place], NP    Other Instructions N/A

## 2021-04-23 NOTE — Progress Notes (Signed)
Cardiology Office Note   Date:  04/23/2021   ID:  Matthew [name], DOB 07-12-1936, MRN [id number]  PCP:  Amie Portland, MD  Cardiologist:   [name], MD   Chief Complaint  Patient presents with  . Other    4 month f/u no complaints today. Meds reviewed verbally with pt.      History of Present Illness: Matthew [name] is a 85 y.o. male who presents for a follow-up visit regarding coronary artery disease , aortic stenosis, chronic systolic heart failure and peripheral arterial disease. He is status post CABG twice in 1991 and 2004. He is status post bilateral common iliac artery kissing stent placement in 2015. He is status post TAVR in June 2017. Cardiac catheterization before that showed patent grafts. He developed left bundle branch block post TAVR and this correlated with a gradual decline in his ejection fraction.  Before TAVR, his EF was 55 to 60%.  Shortly after TAVR, his EF was 55%.  In 2018, his EF was noted to be 45 to 50% and then dropped to 30 to 35% in 2019.  He had cardiac catheterization 2019 for worsening angina.  It showed significant underlying three-vessel coronary artery disease with patent LIMA to LAD, patent RIMA to diagonal, patent SVG to OM 3 and patent SVG to right PDA.  The SVG to OM 2 was found to be occluded.  However, OM 2 had retrograde flow from SVG to 3.  There was possible left subclavian artery stenosis but it was not significant by gradient.  This was also interrogated by carotid Doppler and there was nothing to suggest significant left subclavian stenosis. He has known history of frequent PVCs that improved with treatment.  He was hospitalized in October, 2019 with GI bleed and blood loss anemia.  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  His anemia improved with IV iron.  Echocardiogram in March of 2020 showed an EF of 25 to 30%, mild aortic stenosis with mean gradient of 17 mmHg and mild to moderate aortic regurgitation. The  patient had biventricular pacemaker placement in August 2020 due to persistently low ejection fraction and left bundle branch block.  Repeat echocardiogram in September 2021 showed improvement in ejection fraction to 40 to 45%.  He had worsening angina last year and had repeat cardiac catheterization in September 2021 which showed no significant change in coronary anatomy when compared to previous catheterization in 2019.  He was noted to have progression of native small vessel disease.  Right heart catheterization showed normal filling pressures, normal pulmonary pressure and normal cardiac output.  He has been doing well overall with no recent anginal symptoms.  He has stable exertional dyspnea.  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  Past Medical History:  Diagnosis Date  . Alcohol abuse    stopped drinking 42 years ago from today 2020  . Anemia   . AS (aortic stenosis)    a. Echo 6/10: EF 55% mild AS; b. echo 06/2015; EF 55-60%, GR1DD, moderate AS, Peak velocity (S): 346 cm/s. Mean gradientS): 28 mm Hg. Peak gradient (S): 48 mm Hg. Valve area (VTI): 1.18 cm2;   c. Echo 4/17 - mild LVH, EF 55-60%, no RWMA, Gr 1 DD, mod to severe AS (mean 28 mmHg, peak 46 mmHg), mild LAE  . CAD (coronary artery disease)    a. MI 1996 w/ CABG 1996; b.redo CABG 06/2003; c.  Myoview 7/09: EF 54% inferobasal infarct, no ischemia. Myoview 6/10 EF 43% inf wall infarct. no ischemia; c. cath 8/16 s/p DES to VG-OM2. OTW 3VD w/ patent VG->RPDA, VG->OM3,  & LIMA->LAD.  EF 55-65%.  . Carotid bruit    2009 0-39% on dopplers bilatrally  . Chest pain   . GERD (gastroesophageal reflux disease)   . History of kidney stones   . HOH (hard of hearing)   . HTN (hypertension)   . Hyperlipidemia   . Myocardial infarction ([place]) 1996  . Nephrolithiasis   . OSA (obstructive sleep apnea)   . PAD (peripheral artery disease) ([place]) 02/2014   Subtotal occlusion of right common iliac artery and 70% stenosis in the  left common iliac artery. Status post bilateral kissing stent placement. 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 Shortness of breath    with exertion  . Squamous cell carcinoma of skin 12/17/2020   R angle of mandible - SCCIS, ED&C 01/22/2021  . Squamous cell carcinoma of skin 12/17/2020   R upper arm SCCIS arising in AK, ED&C 01/22/2021  . Squamous cell carcinoma of skin 12/17/2020   R chest - SCCIS, ED&C 01/22/2021  . Thrombocytopenia ([place])     Past Surgical History:  Procedure Laterality Date  . ABDOMINAL AORTAGRAM N/A 03/06/2014   Procedure: ABDOMINAL Maxcine Ham;  Surgeon: Wellington Hampshire, MD;  Location: [facility] CATH LAB;  Service: Cardiovascular;  Laterality: N/A;  . BIV PACEMAKER INSERTION CRT-P N/A 07/23/2019   Procedure: BIV PACEMAKER INSERTION CRT-P;  Surgeon: Deboraha Sprang, MD;  Location: [facility] CV LAB;  Service: Cardiovascular;  Laterality: N/A;  . CARDIAC CATHETERIZATION  02/2014   Severe three-vessel coronary artery disease with patent grafts.   Marland Kitchen CARDIAC CATHETERIZATION N/A 07/31/2015   Procedure: Right/Left Heart Cath and Coronary/Graft Angiography;  Surgeon: Wellington Hampshire, MD;  Location: [place] CV LAB;  Service: Cardiovascular;  Laterality: N/A;  . CARDIAC CATHETERIZATION N/A 07/31/2015   Procedure: Coronary Stent Intervention;  Surgeon: Wellington Hampshire, MD;  Location: [place] CV LAB;  Service: Cardiovascular;  Laterality: N/A;  . CARDIAC CATHETERIZATION N/A 05/26/2016   Procedure: Right/Left Heart Cath and Coronary Angiography;  Surgeon: Wellington Hampshire, MD;  Location: [place] CV LAB;  Service: Cardiovascular;  Laterality: N/A;  . COLONOSCOPY WITH PROPOFOL N/A 10/07/2018   Procedure: COLONOSCOPY WITH PROPOFOL;  Surgeon: Lin Landsman, MD;  Location: [facility] ENDOSCOPY;  Service:  Gastroenterology;  Laterality: N/A;  . COLONOSCOPY WITH PROPOFOL N/A 01/08/2019   Procedure: COLONOSCOPY WITH PROPOFOL;  Surgeon: Lin Landsman, MD;  Location: [facility] ENDOSCOPY;  Service: Gastroenterology;  Laterality: N/A;  . CORONARY ARTERY BYPASS GRAFT  1991   at [facility]. Redone 2004-3 vessels 1st time and 4 second time  . CYSTO     2/3 times for kidney stones  . ENTEROSCOPY N/A 12/25/2018   Procedure: ENTEROSCOPY Single balloon;  Surgeon: Lin Landsman, MD;  Location: [place];  Service: Gastroenterology;  Laterality: N/A;  . ESOPHAGOGASTRODUODENOSCOPY (EGD) WITH PROPOFOL N/A 10/07/2018   Procedure: ESOPHAGOGASTRODUODENOSCOPY (EGD) WITH PROPOFOL;  Surgeon: Lin Landsman, MD;  Location: [facility] ENDOSCOPY;  Service: Gastroenterology;  Laterality: N/A;  . GIVENS CAPSULE STUDY N/A 12/22/2018   Procedure: GIVENS CAPSULE STUDY;  Surgeon: Lin Landsman, MD;  Location: [facility] ENDOSCOPY;  Service: Gastroenterology;  Laterality: N/A;  . INGUINAL HERNIA REPAIR  11/10/2011   Procedure: HERNIA REPAIR INGUINAL ADULT;  Surgeon: Donato Heinz;  Location: [facility];  Service: General;  Laterality: Right;  . LAPAROSCOPIC RIGHT COLECTOMY Right 02/09/2019   Procedure: LAPAROSCOPIC RIGHT COLECTOMY;  Surgeon: Jules Husbands, MD;  Location: [facility] ORS;  Service: General;  Laterality: Right;  . LEFT HEART CATH AND CORONARY ANGIOGRAPHY Left 05/15/2018   Procedure: LEFT HEART CATH AND CORONARY ANGIOGRAPHY;  Surgeon: Wellington Hampshire, MD;  Location: [place] CV LAB;  Service: Cardiovascular;  Laterality: Left;  . LEFT HEART CATHETERIZATION WITH CORONARY/GRAFT ANGIOGRAM N/A 03/06/2014   Procedure: LEFT HEART CATHETERIZATION WITH Beatrix Fetters;  Surgeon: Wellington Hampshire, MD;  Location: [place] CATH LAB;  Service: Cardiovascular;  Laterality: N/A;  . RIGHT HEART CATH AND CORONARY/GRAFT ANGIOGRAPHY N/A 09/05/2020   Procedure: RIGHT HEART CATH AND CORONARY/GRAFT ANGIOGRAPHY;  Surgeon: Wellington Hampshire, MD;  Location: [place] CV LAB;  Service: Cardiovascular;  Laterality: N/A;  . STOMACH SURGERY     removal of gastric ulcers  . TEE WITHOUT CARDIOVERSION N/A 06/01/2016   Procedure: TRANSESOPHAGEAL ECHOCARDIOGRAM (TEE);  Surgeon: Sherren Mocha, MD;  Location: [place];  Service: Open Heart Surgery;  Laterality: N/A;  . TRANSCATHETER AORTIC VALVE REPLACEMENT, TRANSFEMORAL N/A 06/01/2016   Procedure: TRANSCATHETER AORTIC VALVE REPLACEMENT, TRANSFEMORAL;  Surgeon: Sherren Mocha, MD;  Location: [facility];  Service: Open Heart Surgery;  Laterality: N/A;     Current Outpatient Medications  Medication Sig Dispense Refill  . acetaminophen (TYLENOL) 325 MG tablet Take 650 mg by mouth every 6 (six) hours as needed for mild pain or headache.     Marland Kitchen aspirin EC 81 MG tablet Take 1 tablet (81 mg total) by mouth daily. 90 tablet 3  . atorvastatin (LIPITOR) 80 MG tablet TAKE 1 TABLET BY MOUTH  DAILY AT 6 PM 90 tablet 3  . carvedilol (COREG) 3.125 MG tablet Take 1 tablet (3.125 mg total) by mouth 2 (two) times daily. 180 tablet 3  . clopidogrel (PLAVIX) 75 MG tablet clopidogrel 75 mg tablet    . ENTRESTO 97-103 MG TAKE ONE-HALF TABLET BY  MOUTH TWICE DAILY 90 tablet 3  . ezetimibe (ZETIA) 10 MG tablet Take 1 tablet (10 mg total) by mouth daily. 90 tablet 3  . isosorbide mononitrate (IMDUR) 30 MG 24 hr tablet isosorbide mononitrate ER 30 mg tablet,extended release 24 hr    . isosorbide mononitrate (IMDUR) 60 MG 24 hr tablet Take 1 tablet by mouth once daily 30 tablet 0  . lisinopril (ZESTRIL) 2.5 MG tablet Take 2.5 mg by mouth daily.    . Multiple Vitamins-Minerals (PRESERVISION AREDS PO) Take 1 tablet by mouth daily.     . mupirocin ointment (BACTROBAN) 2 % Apply 1 application topically daily. 22 g 1  . pantoprazole (PROTONIX) 40 MG tablet TAKE 1 TABLET BY MOUTH  DAILY 90 tablet 3  . potassium chloride SA (KLOR-CON M20) 20 MEQ tablet Take 1 tablet (20 mEq total) by mouth daily. 90 tablet 2  . tamsulosin  (FLOMAX) 0.4 MG CAPS capsule Take 1 capsule (0.4 mg total) by mouth daily. Future refill request will need to be sent to the pcp 90 capsule 1  . tetrahydrozoline 0.05 % ophthalmic solution Place 2 drops into both eyes 4 (four) times daily as needed (for dry eyes).    . TURMERIC PO Take 1 capsule by mouth at bedtime.     . vitamin B-12 (CYANOCOBALAMIN) 1000 MCG tablet Take 1,000 mcg by mouth daily.  No current facility-administered medications for this visit.   Facility-Administered Medications Ordered in Other Visits  Medication Dose Route Frequency Provider Last Rate Last Admin  . cyanocobalamin ((VITAMIN B-12)) injection 1,000 mcg  1,000 mcg Intramuscular Weekly Sindy Guadeloupe, MD   1,000 mcg at 02/25/20 1447    Allergies:   Ranexa [ranolazine er]    Social History:  The patient  reports that he quit smoking about 31 years ago. His smoking use included cigarettes. He has a 50.00 pack-year smoking history. He has never used smokeless tobacco. He reports that he does not drink alcohol and does not use drugs.   Family History:  The patient's family history includes Heart disease in his father and mother.    ROS:  Please see the history of present illness.   Otherwise, review of systems are positive for none.   All other systems are reviewed and negative.    PHYSICAL EXAM: VS:  BP (!) 142/70 (BP Location: Left Arm, Patient Position: Sitting, Cuff Size: Normal)   Pulse 70   Ht 5\' 9"  (1.753 m)   Wt 202 lb 4 oz (91.7 kg)   SpO2 98%   BMI 29.87 kg/m  , BMI Body mass index is 29.87 kg/m. GEN: Well nourished, well developed, in no acute distress  HEENT: normal  Neck: no JVD, carotid bruits, or masses Cardiac: RRR; no rubs, or gallops,no edema .   2/6 crescendo decrescendo aortic murmur which is early peaking. Respiratory:  clear to auscultation bilaterally, normal work of breathing GI: soft, nontender, nondistended, + BS MS: no deformity or atrophy  Skin: warm and dry, no  rash Neuro:  Strength and sensation are intact Psych: euthymic mood, full affect   EKG:  EKG is ordered today. The ekg ordered today demonstrates AV sequential pacemaker.   Recent Labs: 09/03/2020: BUN 11; Creatinine, Ser 0.99 09/05/2020: Potassium 3.1; Potassium 3.2; Sodium 142; Sodium 142 12/18/2020: ALT 23 01/26/2021: Hemoglobin 15.0; Platelets 93    Lipid Panel    Component Value Date/Time   CHOL 149 12/18/2020 1635   CHOL 160 04/11/2018 1451   TRIG 116 12/18/2020 1635   HDL 49 12/18/2020 1635   HDL 44 04/11/2018 1451   CHOLHDL 3.0 12/18/2020 1635   VLDL 23 12/18/2020 1635   LDLCALC 77 12/18/2020 1635   LDLCALC 63 04/11/2018 1451   LDLDIRECT 83.3 12/18/2020 1635      Wt Readings from Last 3 Encounters:  04/23/21 202 lb 4 oz (91.7 kg)  01/26/21 203 lb 12.8 oz (92.4 kg)  12/18/20 199 lb 6 oz (90.4 kg)        ASSESSMENT AND PLAN:  1.  S/p TAVR  for severe symptomatic aortic stenosis: Most recent echocardiogram in September 2021 showed normal functioning prosthesis with mean gradient of 14 mmHg.    He is currently New [place] Heart Association class II.   2. Coronary artery disease involving graft bypass with stable angina: Symptoms are well controlled with current medications.  Cardiac catheterization last year showed stable coronary anatomy.    3.  Chronic systolic heart failure: Most recent ejection fraction improved to 40 to 45%.  We reviewed his medications.  Lisinopril is listed on his medications and I discussed with his daughter that he does not need to be on this given that he is on [place].  She does not think he was taking it anyway.  4. Essential hypertension: Blood pressure is reasonably controlled  5. Peripheral arterial disease: No recurrent claudication since bilateral iliac  stenting.   6. Hyperlipidemia: Most recent lipid profile showed an LDL of 83 and since then Zetia was added to atorvastatin.  The patient will require follow-up labs with his next  visit if not already done by primary care physician.    Disposition:   FU with me in 6 months  Signed,  [name], MD  04/23/2021 10:13 AM    [place]

## 2021-04-28 ENCOUNTER — Ambulatory Visit (INDEPENDENT_AMBULATORY_CARE_PROVIDER_SITE_OTHER): Payer: Medicare Other

## 2021-04-28 DIAGNOSIS — I429 Cardiomyopathy, unspecified: Secondary | ICD-10-CM

## 2021-04-28 LAB — CUP PACEART REMOTE DEVICE CHECK
Battery Remaining Longevity: 77 mo
Battery Remaining Percentage: 95.5 %
Battery Voltage: 2.99 V
Brady Statistic AP VP Percent: 83 %
Brady Statistic AP VS Percent: 1 %
Brady Statistic AS VP Percent: 12 %
Brady Statistic AS VS Percent: 1 %
Brady Statistic RA Percent Paced: 81 %
Date Time Interrogation Session: 20220517020251
Implantable Lead Implant Date: 20200810
Implantable Lead Implant Date: 20200810
Implantable Lead Implant Date: 20200810
Implantable Lead Location: 753858
Implantable Lead Location: 753859
Implantable Lead Location: 753860
Implantable Lead Model: 5076
Implantable Lead Model: 5076
Implantable Pulse Generator Implant Date: 20200810
Lead Channel Impedance Value: 410 Ohm
Lead Channel Impedance Value: 430 Ohm
Lead Channel Impedance Value: 540 Ohm
Lead Channel Pacing Threshold Amplitude: 0.75 V
Lead Channel Pacing Threshold Amplitude: 1 V
Lead Channel Pacing Threshold Amplitude: 1 V
Lead Channel Pacing Threshold Pulse Width: 0.4 ms
Lead Channel Pacing Threshold Pulse Width: 0.4 ms
Lead Channel Pacing Threshold Pulse Width: 1 ms
Lead Channel Sensing Intrinsic Amplitude: 1.2 mV
Lead Channel Sensing Intrinsic Amplitude: 12 mV
Lead Channel Setting Pacing Amplitude: 2 V
Lead Channel Setting Pacing Amplitude: 2 V
Lead Channel Setting Pacing Amplitude: 2 V
Lead Channel Setting Pacing Pulse Width: 0.4 ms
Lead Channel Setting Pacing Pulse Width: 1 ms
Lead Channel Setting Sensing Sensitivity: 2 mV
Pulse Gen Model: 3562
Pulse Gen Serial Number: 9130392

## 2021-05-06 ENCOUNTER — Encounter: Payer: Self-pay | Admitting: Dermatology

## 2021-05-06 ENCOUNTER — Other Ambulatory Visit: Payer: Self-pay

## 2021-05-06 ENCOUNTER — Ambulatory Visit: Payer: Medicare Other | Admitting: Dermatology

## 2021-05-06 DIAGNOSIS — Z1283 Encounter for screening for malignant neoplasm of skin: Secondary | ICD-10-CM | POA: Diagnosis not present

## 2021-05-06 DIAGNOSIS — L821 Other seborrheic keratosis: Secondary | ICD-10-CM

## 2021-05-06 DIAGNOSIS — Z86007 Personal history of in-situ neoplasm of skin: Secondary | ICD-10-CM | POA: Diagnosis not present

## 2021-05-06 DIAGNOSIS — L578 Other skin changes due to chronic exposure to nonionizing radiation: Secondary | ICD-10-CM

## 2021-05-06 DIAGNOSIS — L814 Other melanin hyperpigmentation: Secondary | ICD-10-CM

## 2021-05-06 DIAGNOSIS — D229 Melanocytic nevi, unspecified: Secondary | ICD-10-CM

## 2021-05-06 DIAGNOSIS — Z872 Personal history of diseases of the skin and subcutaneous tissue: Secondary | ICD-10-CM

## 2021-05-06 DIAGNOSIS — D1800 Hemangioma unspecified site: Secondary | ICD-10-CM

## 2021-05-06 DIAGNOSIS — L570 Actinic keratosis: Secondary | ICD-10-CM | POA: Diagnosis not present

## 2021-05-06 NOTE — Patient Instructions (Addendum)
Actinic keratoses are precancerous spots that appear secondary to cumulative UV radiation exposure/sun exposure over time. They are chronic with expected duration over 1 year. A portion of actinic keratoses will progress to squamous cell carcinoma of the skin. It is not possible to reliably predict which spots will progress to skin cancer and so treatment is recommended to prevent development of skin cancer.  Recommend daily broad spectrum sunscreen SPF 30+ to sun-exposed areas, reapply every 2 hours as needed.  Recommend staying in the shade or wearing long sleeves, sun glasses (UVA+UVB protection) and wide brim hats (4-inch brim around the entire circumference of the hat). Call for new or changing lesions.  Melanoma ABCDEs  Melanoma is the most dangerous type of skin cancer, and is the leading cause of death from skin disease.  You are more likely to develop melanoma if you:  Have light-colored skin, light-colored eyes, or red or blond hair  Spend a lot of time in the sun  Tan regularly, either outdoors or in a tanning bed  Have had blistering sunburns, especially during childhood  Have a close family member who has had a melanoma  Have atypical moles or large birthmarks  Early detection of melanoma is key since treatment is typically straightforward and cure rates are extremely high if we catch it early.   The first sign of melanoma is often a change in a mole or a new dark spot.  The ABCDE system is a way of remembering the signs of melanoma.  A for asymmetry:  The two halves do not match. B for border:  The edges of the growth are irregular. C for color:  A mixture of colors are present instead of an even brown color. D for diameter:  Melanomas are usually (but not always) greater than 19mm - the size of a pencil eraser. E for evolution:  The spot keeps changing in size, shape, and color.  Please check your skin once per month between visits. You can use a small mirror in front  and a large mirror behind you to keep an eye on the back side or your body.   If you see any new or changing lesions before your next follow-up, please call to schedule a visit.  Please continue daily skin protection including broad spectrum sunscreen SPF 30+ to sun-exposed areas, reapplying every 2 hours as needed when you're outdoors.   Staying in the shade or wearing long sleeves, sun glasses (UVA+UVB protection) and wide brim hats (4-inch brim around the entire circumference of the hat) are also recommended for sun protection.   Recommend taking Heliocare sun protection supplement daily in sunny weather for additional sun protection. For maximum protection on the sunniest days, you can take up to 2 capsules of regular Heliocare OR take 1 capsule of Heliocare Ultra. For prolonged exposure (such as a full day in the sun), you can repeat your dose of the supplement 4 hours after your first dose. Heliocare can be purchased at [facility] or at [email or web address].   Recommend Niacinamide or Nicotinamide 500mg  twice per day to lower risk of non-melanoma skin cancer by approximately 25%. Available at vitamin shoppe.    If you have any questions or concerns for your doctor, please call our main line at [phone] and press option 4 to reach your doctor's medical assistant. If no one answers, please leave a voicemail as directed and we will return your call as soon as possible. Messages left after 4 pm will be answered  the following business day.   You may also send [place] a message via [place]. We typically respond to MyChart messages within 1-2 business days.  For prescription refills, please ask your pharmacy to contact our office. Our fax number is [phone].  If you have an urgent issue when the clinic is closed that cannot wait until the next business day, you can page your doctor at the number below.    Please note that while we do our best to be available for urgent issues outside of  office hours, we are not available 24/7.   If you have an urgent issue and are unable to reach [place], you may choose to seek medical care at your doctor's office, retail clinic, urgent care center, or emergency room.  If you have a medical emergency, please immediately call 911 or go to the emergency department.  Pager Numbers  - Dr. Nehemiah Massed: [phone]  - Dr. Laurence Ferrari: [phone]  - Dr. Nicole Kindred: [phone]  In the event of inclement weather, please call our main line at [phone] for an update on the status of any delays or closures.  Dermatology Medication Tips: Please keep the boxes that topical medications come in in order to help keep track of the instructions about where and how to use these. Pharmacies typically print the medication instructions only on the boxes and not directly on the medication tubes.   If your medication is too expensive, please contact our office at [phone] option 4 or send [place] a message through [place].   We are unable to tell what your co-pay for medications will be in advance as this is different depending on your insurance coverage. However, we may be able to find a substitute medication at lower cost or fill out paperwork to get insurance to cover a needed medication.   If a prior authorization is required to get your medication covered by your insurance company, please allow [place] 1-2 business days to complete this process.  Drug prices often vary depending on where the prescription is filled and some pharmacies may offer cheaper prices.  The website www.goodrx.com contains coupons for medications through different pharmacies. The prices here do not account for what the cost may be with help from insurance (it may be cheaper with your insurance), but the website can give you the price if you did not use any insurance.  - You can print the associated coupon and take it with your prescription to the pharmacy.  - You may also stop by our office during  regular business hours and pick up a GoodRx coupon card.  - If you need your prescription sent electronically to a different pharmacy, notify our office through [facility] or by phone at [phone] option 4.  Cryotherapy Aftercare  . Wash gently with soap and water everyday.   Marland Kitchen Apply Vaseline and Band-Aid daily until healed.

## 2021-05-06 NOTE — Progress Notes (Signed)
Follow-Up Visit   Subjective  Matthew [name] is a 85 y.o. male who presents for the following: Annual Exam (Patient here today for tbse. Patient denies any new concerns. ).  Patient here for full body skin exam and skin cancer screening. Patient has history of sccis cancer and history of aks.   The following portions of the chart were reviewed this encounter and updated as appropriate:  Tobacco  Allergies  Meds  Problems  Med Hx  Surg Hx  Fam Hx      Objective  Well appearing patient in no apparent distress; mood and affect are within normal limits.  A full examination was performed including scalp, head, eyes, ears, nose, lips, neck, chest, axillae, abdomen, back, buttocks, bilateral upper extremities, bilateral lower extremities, hands, feet, fingers, toes, fingernails, and toenails. All findings within normal limits unless otherwise noted below.  Objective  Right antihelix ear x 1, right helix ear x 1, left forearm x 3, right elbow x 1 right hand x 2 (8): Erythematous thin papules/macules with gritty scale.   Assessment & Plan  Actinic keratosis (8) Right antihelix ear x 1, right helix ear x 1, left forearm x 3, right elbow x 1 right hand x 2  Prior to procedure, discussed risks of blister formation, small wound, skin dyspigmentation, or rare scar following cryotherapy.     Destruction of lesion - Right antihelix ear x 1, right helix ear x 1, left forearm x 3, right elbow x 1 right hand x 2  Destruction method: cryotherapy   Informed consent: discussed and consent obtained   Lesion destroyed using liquid nitrogen: Yes   Cryotherapy cycles:  2 Outcome: patient tolerated procedure well with no complications   Post-procedure details: wound care instructions given     Lentigines - Scattered tan macules - Due to sun exposure - Benign-appering, observe - Recommend daily broad spectrum sunscreen SPF 30+ to sun-exposed areas, reapply every 2 hours as needed. - Call for  any changes  Seborrheic Keratoses - Stuck-on, waxy, tan-brown papules and/or plaques left leg , left side back  - Benign-appearing - Discussed benign etiology and prognosis. - Observe - Call for any changes  Melanocytic Nevi - Tan-brown and/or pink-flesh-colored symmetric macules and papules - Benign appearing on exam today - Observation - Call clinic for new or changing moles - Recommend daily use of broad spectrum spf 30+ sunscreen to sun-exposed areas.   Hemangiomas - Red papules - Discussed benign nature - Observe - Call for any changes  Actinic Damage - Severe, confluent actinic changes with pre-cancerous actinic keratoses  - Severe, chronic, not at goal, secondary to cumulative UV radiation exposure over time - diffuse scaly erythematous macules and papules with underlying dyspigmentation - Discussed Prescription "Field Treatment" for Severe, Chronic Confluent Actinic Changes with Pre-Cancerous Actinic Keratoses Field treatment involves treatment of an entire area of skin that has confluent Actinic Changes (Sun/ Ultraviolet light damage) and PreCancerous Actinic Keratoses by method of PhotoDynamic Therapy (PDT) and/or prescription Topical Chemotherapy agents such as 5-fluorouracil, 5-fluorouracil/calcipotriene, and/or imiquimod.  The purpose is to decrease the number of clinically evident and subclinical PreCancerous lesions to prevent progression to development of skin cancer by chemically destroying early precancer changes that may or may not be visible.  It has been shown to reduce the risk of developing skin cancer in the treated area. As a result of treatment, redness, scaling, crusting, and open sores may occur during treatment course. One or more than one of these  methods may be used and may have to be used several times to control, suppress and eliminate the PreCancerous changes. Discussed treatment course, expected reaction, and possible side effects. - Recommend daily broad  spectrum sunscreen SPF 30+ to sun-exposed areas, reapply every 2 hours as needed.  - Staying in the shade or wearing long sleeves, sun glasses (UVA+UVB protection) and wide brim hats (4-inch brim around the entire circumference of the hat) are also recommended. - Call for new or changing lesions. Patient deferred treatment at this time. May consider 5 fluorouracil calcipotriene cream or pdt in fall for treatment on hands and arms.    History of PreCancerous Actinic Keratosis  - site(s) of PreCancerous Actinic Keratosis clear today. - these may recur and new lesions may form requiring treatment to prevent transformation into skin cancer - observe for new or changing spots and contact [place] for appointment if occur - photoprotection with sun protective clothing; sunglasses and broad spectrum sunscreen with SPF of at least 30 + and frequent self skin exams recommended - yearly exams by a dermatologist recommended for persons with history of PreCancerous Actinic Keratoses  History of Squamous Cell Carcinoma in Situ of the Skin - No evidence of recurrence today at right chest, right upper arm and right angle mandible (2022) - Recommend regular full body skin exams - Recommend daily broad spectrum sunscreen SPF 30+ to sun-exposed areas, reapply every 2 hours as needed.  - Call if any new or changing lesions are noted between office visits   Skin cancer screening performed today.  Return in about 6 months (around 11/06/2021) for tbse and ak follow up.  I, Ruthell Rummage, CMA, am acting as scribe for Forest Gleason, MD.  Documentation: I have reviewed the above documentation for accuracy and completeness, and I agree with the above.  Forest Gleason, MD

## 2021-05-10 ENCOUNTER — Encounter: Payer: Self-pay | Admitting: Dermatology

## 2021-05-21 NOTE — Progress Notes (Signed)
Remote pacemaker transmission.   

## 2021-05-26 ENCOUNTER — Other Ambulatory Visit: Payer: Self-pay

## 2021-05-26 MED ORDER — POTASSIUM CHLORIDE CRYS ER 20 MEQ PO TBCR: 20.0000 meq | EXTENDED_RELEASE_TABLET | Freq: Every day | ORAL | 3 refills | Status: DC

## 2021-05-26 NOTE — Telephone Encounter (Signed)
Refill sent for Potassium CL

## 2021-07-27 ENCOUNTER — Inpatient Hospital Stay: Payer: Medicare Other | Attending: Oncology

## 2021-07-27 DIAGNOSIS — D509 Iron deficiency anemia, unspecified: Secondary | ICD-10-CM | POA: Insufficient documentation

## 2021-07-27 LAB — CBC WITH DIFFERENTIAL/PLATELET
Abs Immature Granulocytes: 0.06 10*3/uL (ref 0.00–0.07)
Basophils Absolute: 0.1 10*3/uL (ref 0.0–0.1)
Basophils Relative: 1 %
Eosinophils Absolute: 0.2 10*3/uL (ref 0.0–0.5)
Eosinophils Relative: 2 %
HCT: 41.1 % (ref 39.0–52.0)
Hemoglobin: 14.2 g/dL (ref 13.0–17.0)
Immature Granulocytes: 1 %
Lymphocytes Relative: 24 %
Lymphs Abs: 1.6 10*3/uL (ref 0.7–4.0)
MCH: 31.1 pg (ref 26.0–34.0)
MCHC: 34.5 g/dL (ref 30.0–36.0)
MCV: 90.1 fL (ref 80.0–100.0)
Monocytes Absolute: 0.5 10*3/uL (ref 0.1–1.0)
Monocytes Relative: 7 %
Neutro Abs: 4.4 10*3/uL (ref 1.7–7.7)
Neutrophils Relative %: 65 %
Platelets: 107 10*3/uL — ABNORMAL LOW (ref 150–400)
RBC: 4.56 MIL/uL (ref 4.22–5.81)
RDW: 13.2 % (ref 11.5–15.5)
WBC: 6.7 10*3/uL (ref 4.0–10.5)
nRBC: 0 % (ref 0.0–0.2)

## 2021-07-27 LAB — IRON AND TIBC
Iron: 90 ug/dL (ref 45–182)
Saturation Ratios: 34 % (ref 17.9–39.5)
TIBC: 267 ug/dL (ref 250–450)
UIBC: 177 ug/dL

## 2021-07-27 LAB — FERRITIN: Ferritin: 130 ng/mL (ref 24–336)

## 2021-07-28 ENCOUNTER — Ambulatory Visit: Payer: Medicare Other

## 2021-07-29 NOTE — Telephone Encounter (Signed)
I spoke with the patient daughter [name]. I let her know we did not receive his transmission that was due on 07/28/2021. I asked her to have him send a manual transmission with his home monitor to make sure that it works properly. I also gave her the number to [facility] support to get additional help as needed. Lynette verbalized understanding and thanked me for the help.

## 2021-08-03 ENCOUNTER — Encounter: Payer: Self-pay | Admitting: Oncology

## 2021-08-31 LAB — CUP PACEART REMOTE DEVICE CHECK
Battery Remaining Longevity: 52 mo
Battery Remaining Percentage: 68 %
Battery Voltage: 2.99 V
Brady Statistic AP VP Percent: 83 %
Brady Statistic AP VS Percent: 1 %
Brady Statistic AS VP Percent: 13 %
Brady Statistic AS VS Percent: 1 %
Brady Statistic RA Percent Paced: 81 %
Date Time Interrogation Session: 20220816020011
Implantable Lead Implant Date: 20200810
Implantable Lead Implant Date: 20200810
Implantable Lead Implant Date: 20200810
Implantable Lead Location: 753858
Implantable Lead Location: 753859
Implantable Lead Location: 753860
Implantable Lead Model: 5076
Implantable Lead Model: 5076
Implantable Pulse Generator Implant Date: 20200810
Lead Channel Impedance Value: 410 Ohm
Lead Channel Impedance Value: 410 Ohm
Lead Channel Impedance Value: 540 Ohm
Lead Channel Pacing Threshold Amplitude: 0.75 V
Lead Channel Pacing Threshold Amplitude: 1 V
Lead Channel Pacing Threshold Amplitude: 1 V
Lead Channel Pacing Threshold Pulse Width: 0.4 ms
Lead Channel Pacing Threshold Pulse Width: 0.4 ms
Lead Channel Pacing Threshold Pulse Width: 1 ms
Lead Channel Sensing Intrinsic Amplitude: 1.4 mV
Lead Channel Sensing Intrinsic Amplitude: 12 mV
Lead Channel Setting Pacing Amplitude: 2 V
Lead Channel Setting Pacing Amplitude: 2 V
Lead Channel Setting Pacing Amplitude: 2 V
Lead Channel Setting Pacing Pulse Width: 0.4 ms
Lead Channel Setting Pacing Pulse Width: 1 ms
Lead Channel Setting Sensing Sensitivity: 2 mV
Pulse Gen Model: 3562
Pulse Gen Serial Number: 9130392

## 2021-09-29 ENCOUNTER — Encounter: Payer: Self-pay | Admitting: Oncology

## 2021-10-13 ENCOUNTER — Other Ambulatory Visit: Payer: Self-pay | Admitting: Physician Assistant

## 2021-10-27 ENCOUNTER — Ambulatory Visit (INDEPENDENT_AMBULATORY_CARE_PROVIDER_SITE_OTHER): Payer: Medicare Other

## 2021-10-27 DIAGNOSIS — I429 Cardiomyopathy, unspecified: Secondary | ICD-10-CM

## 2021-10-27 LAB — CUP PACEART REMOTE DEVICE CHECK
Battery Remaining Longevity: 50 mo
Battery Remaining Percentage: 65 %
Battery Voltage: 2.99 V
Brady Statistic AP VP Percent: 83 %
Brady Statistic AP VS Percent: 1 %
Brady Statistic AS VP Percent: 13 %
Brady Statistic AS VS Percent: 1 %
Brady Statistic RA Percent Paced: 81 %
Date Time Interrogation Session: 20221115020010
Implantable Lead Implant Date: 20200810
Implantable Lead Implant Date: 20200810
Implantable Lead Implant Date: 20200810
Implantable Lead Location: 753858
Implantable Lead Location: 753859
Implantable Lead Location: 753860
Implantable Lead Model: 5076
Implantable Lead Model: 5076
Implantable Pulse Generator Implant Date: 20200810
Lead Channel Impedance Value: 410 Ohm
Lead Channel Impedance Value: 410 Ohm
Lead Channel Impedance Value: 560 Ohm
Lead Channel Pacing Threshold Amplitude: 0.75 V
Lead Channel Pacing Threshold Amplitude: 1 V
Lead Channel Pacing Threshold Amplitude: 1 V
Lead Channel Pacing Threshold Pulse Width: 0.4 ms
Lead Channel Pacing Threshold Pulse Width: 0.4 ms
Lead Channel Pacing Threshold Pulse Width: 1 ms
Lead Channel Sensing Intrinsic Amplitude: 1.2 mV
Lead Channel Sensing Intrinsic Amplitude: 12 mV
Lead Channel Setting Pacing Amplitude: 2 V
Lead Channel Setting Pacing Amplitude: 2 V
Lead Channel Setting Pacing Amplitude: 2 V
Lead Channel Setting Pacing Pulse Width: 0.4 ms
Lead Channel Setting Pacing Pulse Width: 1 ms
Lead Channel Setting Sensing Sensitivity: 2 mV
Pulse Gen Model: 3562
Pulse Gen Serial Number: 9130392

## 2021-11-03 ENCOUNTER — Other Ambulatory Visit: Payer: Self-pay

## 2021-11-03 ENCOUNTER — Encounter: Payer: Self-pay | Admitting: Cardiovascular Disease

## 2021-11-03 ENCOUNTER — Ambulatory Visit: Payer: Medicare Other | Admitting: Cardiovascular Disease

## 2021-11-03 VITALS — BP 100/60 | HR 70 | Ht 69.0 in | Wt 204.2 lb

## 2021-11-03 DIAGNOSIS — I10 Essential (primary) hypertension: Secondary | ICD-10-CM | POA: Diagnosis not present

## 2021-11-03 DIAGNOSIS — Z952 Presence of prosthetic heart valve: Secondary | ICD-10-CM | POA: Diagnosis not present

## 2021-11-03 DIAGNOSIS — E785 Hyperlipidemia, unspecified: Secondary | ICD-10-CM

## 2021-11-03 DIAGNOSIS — I5022 Chronic systolic (congestive) heart failure: Secondary | ICD-10-CM | POA: Diagnosis not present

## 2021-11-03 DIAGNOSIS — I25708 Atherosclerosis of coronary artery bypass graft(s), unspecified, with other forms of angina pectoris: Secondary | ICD-10-CM | POA: Diagnosis not present

## 2021-11-03 DIAGNOSIS — I739 Peripheral vascular disease, unspecified: Secondary | ICD-10-CM

## 2021-11-03 NOTE — Patient Instructions (Signed)
Medication Instructions:  Your physician recommends that you continue on your current medications as directed. Please refer to the Current Medication list given to you today.  *If you need a refill on your cardiac medications before your next appointment, please call your pharmacy*   Lab Work: Your physician recommends that you return for a FASTING lipid profile and cmp: same day as echo   If you have labs (blood work) drawn today and your tests are completely normal, you will receive your results only by: [place] (if you have MyChart) OR A paper copy in the mail If you have any lab test that is abnormal or we need to change your treatment, we will call you to review the results.   Testing/Procedures: Your physician has requested that you have an echocardiogram. Echocardiography is a painless test that uses sound waves to create images of your heart. It provides your doctor with information about the size and shape of your heart and how well your heart's chambers and valves are working. This procedure takes approximately one hour. There are no restrictions for this procedure.    Follow-Up: At [facility], you and your health needs are our priority.  As part of our continuing mission to provide you with exceptional heart care, we have created designated Provider Care Teams.  These Care Teams include your primary Cardiologist (physician) and Advanced Practice Providers (APPs -  Physician Assistants and Nurse Practitioners) who all work together to provide you with the care you need, when you need it.  We recommend signing up for the patient portal called "MyChart".  Sign up information is provided on this After Visit Summary.  MyChart is used to connect with patients for Virtual Visits (Telemedicine).  Patients are able to view lab/test results, encounter notes, upcoming appointments, etc.  Non-urgent messages can be sent to your provider as well.   To learn more about what you can  do with MyChart, go to [email or web address].    Your next appointment:   6 month(s)  The format for your next appointment:   In Person  Provider:   You may see [name], MD or one of the following Advanced Practice Providers on your designated Care Team:   Murray Hodgkins, NP Christell Faith, PA-C Cadence Kathlen Mody, [place]    Other Instructions N/A

## 2021-11-03 NOTE — Progress Notes (Signed)
Cardiology Office Note   Date:  11/03/2021   ID:  Matthew [name], DOB 24-May-1936, MRN [id number]  PCP:  Antionette Char, MD  Cardiologist:   [name], MD   Chief Complaint  Patient presents with   Other    1 month f/u c/o chest discomfort with exertion and lifting.  Meds reviewed verbally with pt.       History of Present Illness: Matthew [name] is a 85 y.o. male who presents for a follow-up visit regarding coronary artery disease , aortic stenosis, chronic systolic heart failure and peripheral arterial disease. He is status post CABG twice in 1991 and 2004. He is status post bilateral common iliac artery kissing stent placement in 2015. He is status post TAVR in June 2017. Cardiac catheterization before that showed patent grafts. He developed left bundle branch block post TAVR and this correlated with a gradual decline in his ejection fraction.  Before TAVR, his EF was 55 to 60%.  Shortly after TAVR, his EF was 55%.  In 2018, his EF was noted to be 45 to 50% and then dropped to 30 to 35% in 2019.  He had cardiac catheterization 2019 for worsening angina.  It showed significant underlying three-vessel coronary artery disease with patent LIMA to LAD, patent RIMA to diagonal, patent SVG to OM 3 and patent SVG to right PDA.  The SVG to OM 2 was found to be occluded.  However, OM 2 had retrograde flow from SVG to 3.  There was possible left subclavian artery stenosis but it was not significant by gradient.  This was also interrogated by carotid Doppler and there was nothing to suggest significant left subclavian stenosis. He has known history of frequent PVCs that improved with treatment.  He was hospitalized in October, 2019 with GI bleed and blood loss anemia.  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  His anemia improved with IV iron.  Echocardiogram in March of 2020 showed an EF of 25 to 30%, mild aortic stenosis with mean gradient of 17 mmHg and mild to moderate  aortic regurgitation. The patient had biventricular pacemaker placement in August 2020 due to persistently low ejection fraction and left bundle branch block.  Repeat echocardiogram in September 2021 showed improvement in ejection fraction to 40 to 45%.  He had worsening angina last year and had repeat cardiac catheterization in September 2021 which showed no significant change in coronary anatomy when compared to previous catheterization in 2019.  He was noted to have progression of native small vessel disease.  Right heart catheterization showed normal filling pressures, normal pulmonary pressure and normal cardiac output.  He returns for follow-up visit and reports worsening exertional dyspnea.  He has shortness of breath most noticeable when he tries to put his CPAP on.  No chest pain.  No significant leg edema.    Past Medical History:  Diagnosis Date   Alcohol abuse    stopped drinking 42 years ago from today 2020   Anemia    AS (aortic stenosis)    a. Echo 6/10: EF 55% mild AS; b. echo 06/2015; EF 55-60%, GR1DD, moderate AS, Peak velocity (S): 346 cm/s. Mean gradientS): 28 mm Hg. Peak gradient (S): 48 mm Hg. Valve area (VTI): 1.18 cm2;   c. Echo 4/17 - mild LVH, EF 55-60%, no RWMA, Gr 1 DD, mod to severe AS (mean 28 mmHg, peak 46 mmHg), mild LAE   CAD (coronary artery disease)  a. MI 1996 w/ CABG 1996; b.redo CABG 06/2003; c. Myoview 7/09: EF 54% inferobasal infarct, no ischemia. Myoview 6/10 EF 43% inf wall infarct. no ischemia; c. cath 8/16 s/p DES to VG-OM2. OTW 3VD w/ patent VG->RPDA, VG->OM3,  & LIMA->LAD.  EF 55-65%.   Carotid bruit    2009 0-39% on dopplers bilatrally   Chest pain    GERD (gastroesophageal reflux disease)    History of kidney stones    HOH (hard of hearing)    HTN (hypertension)    Hyperlipidemia    Myocardial infarction ([place]) 1996   Nephrolithiasis    OSA (obstructive sleep apnea)    PAD (peripheral artery disease) ([place]) 02/2014   Subtotal occlusion of right  common iliac artery and 70% stenosis in the left common iliac artery. Status post bilateral kissing stent placement. Significant post stenosis aneurysmal dilatation on the right side (any future catheterization through the right femoral artery should be done cautiously to avoid advancing the wire behind the stent struts)   Shortness of breath    with exertion   Squamous cell carcinoma of skin 12/17/2020   R angle of mandible - SCCIS, ED&C 01/22/2021   Squamous cell carcinoma of skin 12/17/2020   R upper arm SCCIS arising in AK, ED&C 01/22/2021   Squamous cell carcinoma of skin 12/17/2020   R chest - SCCIS, ED&C 01/22/2021   Thrombocytopenia ([place])     Past Surgical History:  Procedure Laterality Date   ABDOMINAL AORTAGRAM N/A 03/06/2014   Procedure: ABDOMINAL Maxcine Ham;  Surgeon: Wellington Hampshire, MD;  Location: [facility] CATH LAB;  Service: Cardiovascular;  Laterality: N/A;   BIV PACEMAKER INSERTION CRT-P N/A 07/23/2019   Procedure: BIV PACEMAKER INSERTION CRT-P;  Surgeon: Deboraha Sprang, MD;  Location: [place] CV LAB;  Service: Cardiovascular;  Laterality: N/A;   CARDIAC CATHETERIZATION  02/2014   Severe three-vessel coronary artery disease with patent grafts.    CARDIAC CATHETERIZATION N/A 07/31/2015   Procedure: Right/Left Heart Cath and Coronary/Graft Angiography;  Surgeon: Wellington Hampshire, MD;  Location: [facility] CV LAB;  Service: Cardiovascular;  Laterality: N/A;   CARDIAC CATHETERIZATION N/A 07/31/2015   Procedure: Coronary Stent Intervention;  Surgeon: Wellington Hampshire, MD;  Location: [place] CV LAB;  Service: Cardiovascular;  Laterality: N/A;   CARDIAC CATHETERIZATION N/A 05/26/2016   Procedure: Right/Left Heart Cath and Coronary Angiography;  Surgeon: Wellington Hampshire, MD;  Location: [place] CV LAB;  Service: Cardiovascular;  Laterality: N/A;   COLONOSCOPY WITH PROPOFOL N/A 10/07/2018   Procedure: COLONOSCOPY WITH PROPOFOL;  Surgeon: Lin Landsman, MD;  Location:  [facility] ENDOSCOPY;  Service: Gastroenterology;  Laterality: N/A;   COLONOSCOPY WITH PROPOFOL N/A 01/08/2019   Procedure: COLONOSCOPY WITH PROPOFOL;  Surgeon: Lin Landsman, MD;  Location: [facility] ENDOSCOPY;  Service: Gastroenterology;  Laterality: N/A;   CORONARY ARTERY BYPASS GRAFT  1991   at [facility]. Redone 2004-3 vessels 1st time and 4 second time   CYSTO     2/3 times for kidney stones   ENTEROSCOPY N/A 12/25/2018   Procedure: ENTEROSCOPY Single balloon;  Surgeon: Lin Landsman, MD;  Location: [place];  Service: Gastroenterology;  Laterality: N/A;   ESOPHAGOGASTRODUODENOSCOPY (EGD) WITH PROPOFOL N/A 10/07/2018   Procedure: ESOPHAGOGASTRODUODENOSCOPY (EGD) WITH PROPOFOL;  Surgeon: Lin Landsman, MD;  Location: [facility] ENDOSCOPY;  Service: Gastroenterology;  Laterality: N/A;   GIVENS CAPSULE STUDY N/A 12/22/2018   Procedure: GIVENS CAPSULE STUDY;  Surgeon: Lin Landsman, MD;  Location: [facility] ENDOSCOPY;  Service:  Gastroenterology;  Laterality: N/A;   INGUINAL HERNIA REPAIR  11/10/2011   Procedure: HERNIA REPAIR INGUINAL ADULT;  Surgeon: Donato Heinz;  Location: [facility];  Service: General;  Laterality: Right;   LAPAROSCOPIC RIGHT COLECTOMY Right 02/09/2019   Procedure: LAPAROSCOPIC RIGHT COLECTOMY;  Surgeon: Jules Husbands, MD;  Location: [facility] ORS;  Service: General;  Laterality: Right;   LEFT HEART CATH AND CORONARY ANGIOGRAPHY Left 05/15/2018   Procedure: LEFT HEART CATH AND CORONARY ANGIOGRAPHY;  Surgeon: Wellington Hampshire, MD;  Location: [place] CV LAB;  Service: Cardiovascular;  Laterality: Left;   LEFT HEART CATHETERIZATION WITH CORONARY/GRAFT ANGIOGRAM N/A 03/06/2014   Procedure: LEFT HEART CATHETERIZATION WITH Beatrix Fetters;  Surgeon: Wellington Hampshire, MD;  Location: [place] CATH LAB;  Service: Cardiovascular;  Laterality: N/A;   RIGHT HEART CATH AND CORONARY/GRAFT ANGIOGRAPHY N/A 09/05/2020   Procedure: RIGHT HEART CATH AND CORONARY/GRAFT ANGIOGRAPHY;  Surgeon:  Wellington Hampshire, MD;  Location: [place] CV LAB;  Service: Cardiovascular;  Laterality: N/A;   STOMACH SURGERY     removal of gastric ulcers   TEE WITHOUT CARDIOVERSION N/A 06/01/2016   Procedure: TRANSESOPHAGEAL ECHOCARDIOGRAM (TEE);  Surgeon: Sherren Mocha, MD;  Location: [place];  Service: Open Heart Surgery;  Laterality: N/A;   TRANSCATHETER AORTIC VALVE REPLACEMENT, TRANSFEMORAL N/A 06/01/2016   Procedure: TRANSCATHETER AORTIC VALVE REPLACEMENT, TRANSFEMORAL;  Surgeon: Sherren Mocha, MD;  Location: [place];  Service: Open Heart Surgery;  Laterality: N/A;     Current Outpatient Medications  Medication Sig Dispense Refill   acetaminophen (TYLENOL) 325 MG tablet Take 650 mg by mouth every 6 (six) hours as needed for mild pain or headache.      aspirin EC 81 MG tablet Take 1 tablet (81 mg total) by mouth daily. 90 tablet 3   atorvastatin (LIPITOR) 80 MG tablet TAKE 1 TABLET BY MOUTH  DAILY AT 6 PM 90 tablet 3   carvedilol (COREG) 3.125 MG tablet TAKE 1 TABLET BY MOUTH  TWICE DAILY 180 tablet 3   clopidogrel (PLAVIX) 75 MG tablet clopidogrel 75 mg tablet     ENTRESTO 97-103 MG TAKE ONE-HALF TABLET BY  MOUTH TWICE DAILY 90 tablet 3   ezetimibe (ZETIA) 10 MG tablet TAKE 1 TABLET BY MOUTH  DAILY 90 tablet 3   isosorbide mononitrate (IMDUR) 60 MG 24 hr tablet Take 1 tablet by mouth once daily 30 tablet 0   Multiple Vitamins-Minerals (PRESERVISION AREDS PO) Take 1 tablet by mouth daily.      mupirocin ointment (BACTROBAN) 2 % Apply 1 application topically daily. 22 g 1   pantoprazole (PROTONIX) 40 MG tablet TAKE 1 TABLET BY MOUTH  DAILY 90 tablet 3   potassium chloride SA (KLOR-CON M20) 20 MEQ tablet Take 1 tablet (20 mEq total) by mouth daily. 90 tablet 3   tamsulosin (FLOMAX) 0.4 MG CAPS capsule Take 1 capsule (0.4 mg total) by mouth daily. Future refill request will need to be sent to the pcp 90 capsule 1   tetrahydrozoline 0.05 % ophthalmic solution Place 2 drops into both eyes 4 (four)  times daily as needed (for dry eyes).     TURMERIC PO Take 1 capsule by mouth at bedtime.      vitamin B-12 (CYANOCOBALAMIN) 1000 MCG tablet Take 1,000 mcg by mouth daily.     No current facility-administered medications for this visit.   Facility-Administered Medications Ordered in Other Visits  Medication Dose Route Frequency Provider Last Rate Last Admin   cyanocobalamin ((VITAMIN B-12)) injection 1,000  mcg  1,000 mcg Intramuscular Weekly Sindy Guadeloupe, MD   1,000 mcg at 02/25/20 1447    Allergies:   Ranitidine and Ranexa [ranolazine er]    Social History:  The patient  reports that he quit smoking about 31 years ago. His smoking use included cigarettes. He has a 50.00 pack-year smoking history. He has never used smokeless tobacco. He reports that he does not drink alcohol and does not use drugs.   Family History:  The patient's family history includes Heart disease in his father and mother.    ROS:  Please see the history of present illness.   Otherwise, review of systems are positive for none.   All other systems are reviewed and negative.    PHYSICAL EXAM: VS:  BP 100/60 (BP Location: Left Arm, Patient Position: Sitting, Cuff Size: Normal)   Pulse 70   Ht 5\' 9"  (1.753 m)   Wt 204 lb 4 oz (92.6 kg)   SpO2 95%   BMI 30.16 kg/m  , BMI Body mass index is 30.16 kg/m. GEN: Well nourished, well developed, in no acute distress  HEENT: normal  Neck: no JVD, carotid bruits, or masses Cardiac: RRR; no rubs, or gallops,no edema .   2/6 crescendo decrescendo aortic murmur which is early peaking. Respiratory:  clear to auscultation bilaterally, normal work of breathing GI: soft, nontender, nondistended, + BS MS: no deformity or atrophy  Skin: warm and dry, no rash Neuro:  Strength and sensation are intact Psych: euthymic mood, full affect   EKG:  EKG is ordered today. The ekg ordered today demonstrates AV sequential pacemaker.   Recent Labs: 12/18/2020: ALT 23 07/27/2021:  Hemoglobin 14.2; Platelets 107    Lipid Panel    Component Value Date/Time   CHOL 149 12/18/2020 1635   CHOL 160 04/11/2018 1451   TRIG 116 12/18/2020 1635   HDL 49 12/18/2020 1635   HDL 44 04/11/2018 1451   CHOLHDL 3.0 12/18/2020 1635   VLDL 23 12/18/2020 1635   LDLCALC 77 12/18/2020 1635   LDLCALC 63 04/11/2018 1451   LDLDIRECT 83.3 12/18/2020 1635      Wt Readings from Last 3 Encounters:  11/03/21 204 lb 4 oz (92.6 kg)  04/23/21 202 lb 4 oz (91.7 kg)  01/26/21 203 lb 12.8 oz (92.4 kg)        ASSESSMENT AND PLAN:  1.  S/p TAVR  for severe symptomatic aortic stenosis: Most recent echocardiogram in September 2021 showed normal functioning prosthesis with mean gradient of 14 mmHg.    He reports worsening exertional dyspnea and thus I requested a follow-up echocardiogram.  2. Coronary artery disease involving graft bypass with stable angina: Symptoms are well controlled with current medications.  Cardiac catheterization last year showed stable coronary anatomy.    3.  Chronic systolic heart failure: Most recent ejection fraction improved to 40 to 45%.  Continue Entresto and carvedilol.  I requested basic metabolic profile.  4. Essential hypertension: Blood pressure is on the low side which is unusual for him.  CBC in August showed normal hemoglobin.  We will check basic metabolic profile to ensure no volume depletion.  5. Peripheral arterial disease: No recurrent claudication since bilateral iliac stenting.   6. Hyperlipidemia: Previous LDL was 83 and since then Zetia was added.  I requested a follow-up lipid and liver profile.    Disposition:   FU with me in 6 months  Signed,  [name], MD  11/03/2021 3:50 PM    [facility]

## 2021-11-04 NOTE — Progress Notes (Signed)
Remote pacemaker transmission.   

## 2021-11-11 ENCOUNTER — Ambulatory Visit: Payer: Medicare Other | Admitting: Dermatology

## 2021-11-11 ENCOUNTER — Other Ambulatory Visit: Payer: Self-pay

## 2021-11-11 DIAGNOSIS — D1800 Hemangioma unspecified site: Secondary | ICD-10-CM | POA: Diagnosis not present

## 2021-11-11 DIAGNOSIS — Z1283 Encounter for screening for malignant neoplasm of skin: Secondary | ICD-10-CM

## 2021-11-11 DIAGNOSIS — L821 Other seborrheic keratosis: Secondary | ICD-10-CM

## 2021-11-11 DIAGNOSIS — L578 Other skin changes due to chronic exposure to nonionizing radiation: Secondary | ICD-10-CM

## 2021-11-11 DIAGNOSIS — D229 Melanocytic nevi, unspecified: Secondary | ICD-10-CM

## 2021-11-11 DIAGNOSIS — L570 Actinic keratosis: Secondary | ICD-10-CM | POA: Diagnosis not present

## 2021-11-11 DIAGNOSIS — L814 Other melanin hyperpigmentation: Secondary | ICD-10-CM

## 2021-11-11 NOTE — Patient Instructions (Addendum)
Actinic keratoses are precancerous spots that appear secondary to cumulative UV radiation exposure/sun exposure over time. They are chronic with expected duration over 1 year. A portion of actinic keratoses will progress to squamous cell carcinoma of the skin. It is not possible to reliably predict which spots will progress to skin cancer and so treatment is recommended to prevent development of skin cancer.  Recommend daily broad spectrum sunscreen SPF 30+ to sun-exposed areas, reapply every 2 hours as needed.  Recommend staying in the shade or wearing long sleeves, sun glasses (UVA+UVB protection) and wide brim hats (4-inch brim around the entire circumference of the hat). Call for new or changing lesions.   Cryotherapy Aftercare  Wash gently with soap and water everyday.   Apply Vaseline and Band-Aid daily until healed.       Melanoma ABCDEs  Melanoma is the most dangerous type of skin cancer, and is the leading cause of death from skin disease.  You are more likely to develop melanoma if you: Have light-colored skin, light-colored eyes, or red or blond hair Spend a lot of time in the sun Tan regularly, either outdoors or in a tanning bed Have had blistering sunburns, especially during childhood Have a close family member who has had a melanoma Have atypical moles or large birthmarks  Early detection of melanoma is key since treatment is typically straightforward and cure rates are extremely high if we catch it early.   The first sign of melanoma is often a change in a mole or a new dark spot.  The ABCDE system is a way of remembering the signs of melanoma.  A for asymmetry:  The two halves do not match. B for border:  The edges of the growth are irregular. C for color:  A mixture of colors are present instead of an even brown color. D for diameter:  Melanomas are usually (but not always) greater than 34mm - the size of a pencil eraser. E for evolution:  The spot keeps changing in  size, shape, and color.  Please check your skin once per month between visits. You can use a small mirror in front and a large mirror behind you to keep an eye on the back side or your body.   If you see any new or changing lesions before your next follow-up, please call to schedule a visit.  Please continue daily skin protection including broad spectrum sunscreen SPF 30+ to sun-exposed areas, reapplying every 2 hours as needed when you're outdoors.   Staying in the shade or wearing long sleeves, sun glasses (UVA+UVB protection) and wide brim hats (4-inch brim around the entire circumference of the hat) are also recommended for sun protection.      If You Need Anything After Your Visit  If you have any questions or concerns for your doctor, please call our main line at [phone] and press option 4 to reach your doctor's medical assistant. If no one answers, please leave a voicemail as directed and we will return your call as soon as possible. Messages left after 4 pm will be answered the following business day.   You may also send [place] a message via [place]. We typically respond to MyChart messages within 1-2 business days.  For prescription refills, please ask your pharmacy to contact our office. Our fax number is [phone].  If you have an urgent issue when the clinic is closed that cannot wait until the next business day, you can page your doctor at the number  below.    Please note that while we do our best to be available for urgent issues outside of office hours, we are not available 24/7.   If you have an urgent issue and are unable to reach [place], you may choose to seek medical care at your doctor's office, retail clinic, urgent care center, or emergency room.  If you have a medical emergency, please immediately call 911 or go to the emergency department.  Pager Numbers  - Dr. Nehemiah Massed: [phone]  - Dr. Laurence Ferrari: [phone]  - Dr. Nicole Kindred: [phone]  In the event of  inclement weather, please call our main line at [phone] for an update on the status of any delays or closures.  Dermatology Medication Tips: Please keep the boxes that topical medications come in in order to help keep track of the instructions about where and how to use these. Pharmacies typically print the medication instructions only on the boxes and not directly on the medication tubes.   If your medication is too expensive, please contact our office at [phone] option 4 or send [place] a message through [place].   We are unable to tell what your co-pay for medications will be in advance as this is different depending on your insurance coverage. However, we may be able to find a substitute medication at lower cost or fill out paperwork to get insurance to cover a needed medication.   If a prior authorization is required to get your medication covered by your insurance company, please allow [place] 1-2 business days to complete this process.  Drug prices often vary depending on where the prescription is filled and some pharmacies may offer cheaper prices.  The website www.goodrx.com contains coupons for medications through different pharmacies. The prices here do not account for what the cost may be with help from insurance (it may be cheaper with your insurance), but the website can give you the price if you did not use any insurance.  - You can print the associated coupon and take it with your prescription to the pharmacy.  - You may also stop by our office during regular business hours and pick up a GoodRx coupon card.  - If you need your prescription sent electronically to a different pharmacy, notify our office through [facility] or by phone at [phone] option 4.     Si Usted Necesita Algo Despus de Su Visita  Tambin puede enviarnos un mensaje a travs de Pharmacist, community. Por lo general respondemos a los mensajes de MyChart en el transcurso de 1 a 2 das hbiles.  Para renovar  recetas, por favor pida a su farmacia que se ponga en contacto con nuestra oficina. Harland Dingwall de fax es [place] [phone].  Si tiene un asunto urgente cuando la clnica est cerrada y que no puede esperar hasta el siguiente da hbil, puede llamar/localizar a su doctor(a) al nmero que aparece a continuacin.   Por favor, tenga en cuenta que aunque hacemos todo lo posible para estar disponibles para asuntos urgentes fuera del horario de [place], no estamos disponibles las 24 horas del da, los 7 das de la [place].   Si tiene un problema urgente y no puede comunicarse con nosotros, puede optar por buscar atencin mdica  en el consultorio de su doctor(a), en una clnica privada, en un centro de atencin urgente o en una sala de emergencias.  Si tiene Engineering geologist, por favor llame inmediatamente al 911 o vaya a la sala de emergencias.  Nmeros de bper  -  Dr. Nehemiah Massed: [phone]  - Dra. Moye: [phone]  - Dra. Nicole Kindred: [phone]  En caso de inclemencias del [place], por favor llame a Johnsie Kindred principal al [phone] para una actualizacin sobre el [place] de cualquier retraso o cierre.  Consejos para la medicacin en dermatologa: Por favor, guarde las cajas en las que vienen los medicamentos de uso tpico para ayudarle a seguir las instrucciones sobre dnde y cmo usarlos. Las farmacias generalmente imprimen las instrucciones del medicamento slo en las cajas y no directamente en los tubos del [place].   Si su medicamento es muy caro, por favor, pngase en contacto con Zigmund Daniel llamando al [phone] y presione la opcin 4 o envenos un mensaje a travs de Pharmacist, community.   No podemos decirle cul ser su copago por los medicamentos por adelantado ya que esto es diferente dependiendo de la cobertura de su seguro. Sin embargo, es posible que podamos encontrar un medicamento sustituto a Electrical engineer un formulario para que el seguro cubra el medicamento  que se considera necesario.   Si se requiere una autorizacin previa para que su compaa de seguros [place] su medicamento, por favor permtanos de 1 a 2 das hbiles para completar este proceso.  Los precios de los medicamentos varan con frecuencia dependiendo del Environmental consultant de dnde se surte la receta y alguna farmacias pueden ofrecer precios ms baratos.  El sitio web www.goodrx.com tiene cupones para medicamentos de Airline pilot. Los precios aqu no tienen en cuenta lo que podra costar con la ayuda del seguro (puede ser ms barato con su seguro), pero el sitio web puede darle el precio si no utiliz Research scientist (physical sciences).  - Puede imprimir el cupn correspondiente y llevarlo con su receta a la farmacia.  - Tambin puede pasar por nuestra oficina durante el horario de atencin regular y Charity fundraiser una tarjeta de cupones de GoodRx.  - Si necesita que su receta se enve electrnicamente a una farmacia diferente, informe a nuestra oficina a travs de MyChart de [place] o por telfono llamando al [phone] y presione la opcin 4.

## 2021-11-11 NOTE — Progress Notes (Signed)
Follow-Up Visit   Subjective  Matthew [name] is a 85 y.o. male who presents for the following: Follow-up (Patient here today for 6 month follow up. Patient has history of ak patient reports some new areas today to have checked. ).  Patient's daughter states patient unable to use 76f/u cream or PDT treatment due to heart issues. Cardiologist recommends holding off.   Patient here for full body skin exam and skin cancer screening.  The following portions of the chart were reviewed this encounter and updated as appropriate:  Tobacco  Allergies  Meds  Problems  Med Hx  Surg Hx  Fam Hx      Review of Systems: No other skin or systemic complaints except as noted in HPI or Assessment and Plan.   Objective  Well appearing patient in no apparent distress; mood and affect are within normal limits.  A full examination was performed including scalp, head, eyes, ears, nose, lips, neck, chest, axillae, abdomen, back, buttocks, bilateral upper extremities, bilateral lower extremities, hands, feet, fingers, toes, fingernails, and toenails. All findings within normal limits unless otherwise noted below.  left dorsal hand x2, left elbow x 3, left forearm x 2, left upper arm x 8, right hand x 3, right wrist x 3, right elbow x 2, right upper arm x 3, right shoulder x 1, right posterior shoulder x 3, (30) Erythematous thin papules/macules with gritty scale.   Assessment & Plan   Lentigines - Scattered tan macules - Due to sun exposure - Benign-appearing, observe - Recommend daily broad spectrum sunscreen SPF 30+ to sun-exposed areas, reapply every 2 hours as needed. - Call for any changes  Seborrheic Keratoses - Stuck-on, waxy, tan-brown papules and/or plaques  - Benign-appearing - Discussed benign etiology and prognosis. - Observe - Call for any changes  Melanocytic Nevi - Tan-brown and/or pink-flesh-colored symmetric macules and papules - Benign appearing on exam today -  Observation - Call clinic for new or changing moles - Recommend daily use of broad spectrum spf 30+ sunscreen to sun-exposed areas.   Hemangiomas - Red papules - Discussed benign nature - Observe - Call for any changes  Actinic Damage - Severe, confluent actinic changes with pre-cancerous actinic keratoses  - Severe, chronic, not at goal, secondary to cumulative UV radiation exposure over time - Per patient's daughter, we are not considering field therapy due to Dr. Tyrell Antonio (cardiology) concerns that Matthew [name] heart may not tolerate it. - diffuse scaly erythematous macules and papules with underlying dyspigmentation - Recommend daily broad spectrum sunscreen SPF 30+ to sun-exposed areas, reapply every 2 hours as needed.  - Staying in the shade or wearing long sleeves, sun glasses (UVA+UVB protection) and wide brim hats (4-inch brim around the entire circumference of the hat) are also recommended. - Call for new or changing lesions.  Skin cancer screening performed today.  Actinic keratosis (30) left dorsal hand x2, left elbow x 3, left forearm x 2, left upper arm x 8, right hand x 3, right wrist x 3, right elbow x 2, right upper arm x 3, right shoulder x 1, right posterior shoulder x 3,  Actinic keratoses are precancerous spots that appear secondary to cumulative UV radiation exposure/sun exposure over time. They are chronic with expected duration over 1 year. A portion of actinic keratoses will progress to squamous cell carcinoma of the skin. It is not possible to reliably predict which spots will progress to skin cancer and so treatment is recommended to prevent development of  skin cancer.  Recommend daily broad spectrum sunscreen SPF 30+ to sun-exposed areas, reapply every 2 hours as needed.  Recommend staying in the shade or wearing long sleeves, sun glasses (UVA+UVB protection) and wide brim hats (4-inch brim around the entire circumference of the hat). Call for new or changing  lesions.  Prior to procedure, discussed risks of blister formation, small wound, skin dyspigmentation, or rare scar following cryotherapy. Recommend Vaseline ointment to treated areas while healing.    Destruction of lesion - left dorsal hand x2, left elbow x 3, left forearm x 2, left upper arm x 8, right hand x 3, right wrist x 3, right elbow x 2, right upper arm x 3, right shoulder x 1, right posterior shoulder x 3, Complexity: simple   Destruction method: cryotherapy   Informed consent: discussed and consent obtained   Timeout:  patient name, date of birth, surgical site, and procedure verified Lesion destroyed using liquid nitrogen: Yes   Region frozen until ice ball extended beyond lesion: Yes   Cryotherapy cycles:  2 Outcome: patient tolerated procedure well with no complications   Post-procedure details: wound care instructions given   Additional details:  Prior to procedure, discussed risks of blister formation, small wound, skin dyspigmentation, or rare scar following cryotherapy. Recommend Vaseline ointment to treated areas while healing.  History of Squamous Cell Carcinoma in Situ of the Skin - No evidence of recurrence today right chest, right upper arm and right angle mandible 2022 - Recommend regular full body skin exams - Recommend daily broad spectrum sunscreen SPF 30+ to sun-exposed areas, reapply every 2 hours as needed.  - Call if any new or changing lesions are noted between office visits  Return in about 6 months (around 05/11/2022) for 6 - 12 month follow up tbse ak check . I, Ruthell Rummage, CMA, am acting as scribe for Forest Gleason, MD.  Documentation: I have reviewed the above documentation for accuracy and completeness, and I agree with the above.  Forest Gleason, MD

## 2021-11-16 ENCOUNTER — Telehealth: Payer: Self-pay | Admitting: Dermatology

## 2021-11-16 ENCOUNTER — Encounter: Payer: Self-pay | Admitting: Dermatology

## 2021-11-16 NOTE — Telephone Encounter (Signed)
I spoke with Dr. Fletcher Anon and he has no concerns about Matthew [name] taking Niacinamide 500 mg twice a day to lower the risk of making additional skin cancers by 25%.   Please call patient and let him know I recommend taking Niacinamide or Nicotinamide 500mg  twice per day to lower risk of non-melanoma skin cancer by approximately 25%. This is a vitamin usually available at [employer].

## 2021-11-18 ENCOUNTER — Telehealth: Payer: Self-pay

## 2021-11-18 DIAGNOSIS — R7303 Prediabetes: Secondary | ICD-10-CM | POA: Insufficient documentation

## 2021-11-18 NOTE — Telephone Encounter (Signed)
Left pt message to call back. Dr. Jerilynn Mages recommending nicotinamide.Cherly Hensen

## 2021-12-10 ENCOUNTER — Other Ambulatory Visit: Payer: Medicare Other

## 2021-12-11 ENCOUNTER — Other Ambulatory Visit: Payer: Medicare Other

## 2021-12-31 ENCOUNTER — Other Ambulatory Visit: Payer: Self-pay | Admitting: Internal Medicine

## 2021-12-31 ENCOUNTER — Other Ambulatory Visit: Payer: Self-pay | Admitting: Cardiovascular Disease

## 2022-01-01 NOTE — Telephone Encounter (Signed)
This is a [place] pt 

## 2022-01-07 ENCOUNTER — Other Ambulatory Visit: Payer: Self-pay

## 2022-01-07 ENCOUNTER — Other Ambulatory Visit (INDEPENDENT_AMBULATORY_CARE_PROVIDER_SITE_OTHER): Payer: Medicare Other

## 2022-01-07 ENCOUNTER — Ambulatory Visit (INDEPENDENT_AMBULATORY_CARE_PROVIDER_SITE_OTHER): Payer: Medicare Other

## 2022-01-07 DIAGNOSIS — I5022 Chronic systolic (congestive) heart failure: Secondary | ICD-10-CM

## 2022-01-07 DIAGNOSIS — E785 Hyperlipidemia, unspecified: Secondary | ICD-10-CM | POA: Diagnosis not present

## 2022-01-07 LAB — ECHOCARDIOGRAM COMPLETE
AR max vel: 1.65 cm2
AV Area VTI: 1.66 cm2
AV Area mean vel: 1.56 cm2
AV Mean grad: 10 mmHg
AV Peak grad: 16.1 mmHg
Ao pk vel: 2.01 m/s
Area-P 1/2: 1.53 cm2

## 2022-01-07 MED ORDER — PERFLUTREN LIPID MICROSPHERE
1.0000 mL | INTRAVENOUS | Status: AC | PRN
  Administered 2022-01-07: 2 mL via INTRAVENOUS

## 2022-01-08 ENCOUNTER — Encounter: Payer: Self-pay | Admitting: Cardiovascular Disease

## 2022-01-08 LAB — COMPREHENSIVE METABOLIC PANEL
ALT: 45 IU/L — ABNORMAL HIGH (ref 0–44)
AST: 25 IU/L (ref 0–40)
Albumin/Globulin Ratio: 2.1 (ref 1.2–2.2)
Albumin: 4.5 g/dL (ref 3.6–4.6)
Alkaline Phosphatase: 94 IU/L (ref 44–121)
BUN/Creatinine Ratio: 9 — ABNORMAL LOW (ref 10–24)
BUN: 12 mg/dL (ref 8–27)
Bilirubin Total: 1.8 mg/dL — ABNORMAL HIGH (ref 0.0–1.2)
CO2: 25 mmol/L (ref 20–29)
Calcium: 9.9 mg/dL (ref 8.6–10.2)
Chloride: 105 mmol/L (ref 96–106)
Creatinine, Ser: 1.31 mg/dL — ABNORMAL HIGH (ref 0.76–1.27)
Globulin, Total: 2.1 g/dL (ref 1.5–4.5)
Glucose: 165 mg/dL — ABNORMAL HIGH (ref 70–99)
Potassium: 4.2 mmol/L (ref 3.5–5.2)
Sodium: 142 mmol/L (ref 134–144)
Total Protein: 6.6 g/dL (ref 6.0–8.5)
eGFR: 54 mL/min/{1.73_m2} — ABNORMAL LOW (ref >59)

## 2022-01-08 LAB — LIPID PANEL
Chol/HDL Ratio: 2.6 ratio (ref 0.0–5.0)
Cholesterol, Total: 144 mg/dL (ref 100–199)
HDL: 56 mg/dL (ref >39)
LDL Chol Calc (NIH): 67 mg/dL (ref 0–99)
Triglycerides: 120 mg/dL (ref 0–149)
VLDL Cholesterol Cal: 21 mg/dL (ref 5–40)

## 2022-01-20 ENCOUNTER — Other Ambulatory Visit: Payer: Self-pay | Admitting: *Deleted

## 2022-01-20 DIAGNOSIS — D509 Iron deficiency anemia, unspecified: Secondary | ICD-10-CM

## 2022-01-25 ENCOUNTER — Encounter: Payer: Self-pay | Admitting: Oncology

## 2022-01-26 ENCOUNTER — Inpatient Hospital Stay (HOSPITAL_BASED_OUTPATIENT_CLINIC_OR_DEPARTMENT_OTHER): Payer: Medicare Other | Admitting: Oncology

## 2022-01-26 ENCOUNTER — Other Ambulatory Visit: Payer: Self-pay

## 2022-01-26 ENCOUNTER — Inpatient Hospital Stay: Payer: Medicare Other | Attending: Oncology

## 2022-01-26 ENCOUNTER — Encounter: Payer: Self-pay | Admitting: Oncology

## 2022-01-26 ENCOUNTER — Ambulatory Visit (INDEPENDENT_AMBULATORY_CARE_PROVIDER_SITE_OTHER): Payer: Medicare Other

## 2022-01-26 VITALS — BP 122/62 | HR 70 | Temp 98.2°F | Resp 16 | Ht 69.0 in | Wt 204.0 lb

## 2022-01-26 DIAGNOSIS — Z9049 Acquired absence of other specified parts of digestive tract: Secondary | ICD-10-CM | POA: Insufficient documentation

## 2022-01-26 DIAGNOSIS — Z87891 Personal history of nicotine dependence: Secondary | ICD-10-CM | POA: Insufficient documentation

## 2022-01-26 DIAGNOSIS — I10 Essential (primary) hypertension: Secondary | ICD-10-CM | POA: Diagnosis not present

## 2022-01-26 DIAGNOSIS — K635 Polyp of colon: Secondary | ICD-10-CM | POA: Insufficient documentation

## 2022-01-26 DIAGNOSIS — Z85828 Personal history of other malignant neoplasm of skin: Secondary | ICD-10-CM | POA: Diagnosis not present

## 2022-01-26 DIAGNOSIS — D509 Iron deficiency anemia, unspecified: Secondary | ICD-10-CM | POA: Insufficient documentation

## 2022-01-26 DIAGNOSIS — K449 Diaphragmatic hernia without obstruction or gangrene: Secondary | ICD-10-CM | POA: Diagnosis not present

## 2022-01-26 DIAGNOSIS — I252 Old myocardial infarction: Secondary | ICD-10-CM | POA: Insufficient documentation

## 2022-01-26 DIAGNOSIS — I739 Peripheral vascular disease, unspecified: Secondary | ICD-10-CM | POA: Insufficient documentation

## 2022-01-26 DIAGNOSIS — I2510 Atherosclerotic heart disease of native coronary artery without angina pectoris: Secondary | ICD-10-CM | POA: Insufficient documentation

## 2022-01-26 DIAGNOSIS — Z79899 Other long term (current) drug therapy: Secondary | ICD-10-CM | POA: Diagnosis not present

## 2022-01-26 DIAGNOSIS — I429 Cardiomyopathy, unspecified: Secondary | ICD-10-CM

## 2022-01-26 DIAGNOSIS — Z8249 Family history of ischemic heart disease and other diseases of the circulatory system: Secondary | ICD-10-CM | POA: Insufficient documentation

## 2022-01-26 DIAGNOSIS — I350 Nonrheumatic aortic (valve) stenosis: Secondary | ICD-10-CM | POA: Insufficient documentation

## 2022-01-26 DIAGNOSIS — D696 Thrombocytopenia, unspecified: Secondary | ICD-10-CM | POA: Insufficient documentation

## 2022-01-26 LAB — CUP PACEART REMOTE DEVICE CHECK
Battery Remaining Longevity: 47 mo
Battery Remaining Percentage: 61 %
Battery Voltage: 2.99 V
Brady Statistic AP VP Percent: 83 %
Brady Statistic AP VS Percent: 1 %
Brady Statistic AS VP Percent: 12 %
Brady Statistic AS VS Percent: 1 %
Brady Statistic RA Percent Paced: 81 %
Date Time Interrogation Session: 20230214020029
Implantable Lead Implant Date: 20200810
Implantable Lead Implant Date: 20200810
Implantable Lead Implant Date: 20200810
Implantable Lead Location: 753858
Implantable Lead Location: 753859
Implantable Lead Location: 753860
Implantable Lead Model: 5076
Implantable Lead Model: 5076
Implantable Pulse Generator Implant Date: 20200810
Lead Channel Impedance Value: 410 Ohm
Lead Channel Impedance Value: 430 Ohm
Lead Channel Impedance Value: 560 Ohm
Lead Channel Pacing Threshold Amplitude: 0.75 V
Lead Channel Pacing Threshold Amplitude: 1 V
Lead Channel Pacing Threshold Amplitude: 1 V
Lead Channel Pacing Threshold Pulse Width: 0.4 ms
Lead Channel Pacing Threshold Pulse Width: 0.4 ms
Lead Channel Pacing Threshold Pulse Width: 1 ms
Lead Channel Sensing Intrinsic Amplitude: 1.4 mV
Lead Channel Sensing Intrinsic Amplitude: 12 mV
Lead Channel Setting Pacing Amplitude: 2 V
Lead Channel Setting Pacing Amplitude: 2 V
Lead Channel Setting Pacing Amplitude: 2 V
Lead Channel Setting Pacing Pulse Width: 0.4 ms
Lead Channel Setting Pacing Pulse Width: 1 ms
Lead Channel Setting Sensing Sensitivity: 2 mV
Pulse Gen Model: 3562
Pulse Gen Serial Number: 9130392

## 2022-01-26 LAB — CBC WITH DIFFERENTIAL/PLATELET
Abs Immature Granulocytes: 0.03 10*3/uL (ref 0.00–0.07)
Basophils Absolute: 0.1 10*3/uL (ref 0.0–0.1)
Basophils Relative: 1 %
Eosinophils Absolute: 0.2 10*3/uL (ref 0.0–0.5)
Eosinophils Relative: 3 %
HCT: 44.2 % (ref 39.0–52.0)
Hemoglobin: 14.5 g/dL (ref 13.0–17.0)
Immature Granulocytes: 1 %
Lymphocytes Relative: 29 %
Lymphs Abs: 1.9 10*3/uL (ref 0.7–4.0)
MCH: 30 pg (ref 26.0–34.0)
MCHC: 32.8 g/dL (ref 30.0–36.0)
MCV: 91.5 fL (ref 80.0–100.0)
Monocytes Absolute: 0.5 10*3/uL (ref 0.1–1.0)
Monocytes Relative: 8 %
Neutro Abs: 3.9 10*3/uL (ref 1.7–7.7)
Neutrophils Relative %: 58 %
Platelets: 103 10*3/uL — ABNORMAL LOW (ref 150–400)
RBC: 4.83 MIL/uL (ref 4.22–5.81)
RDW: 13.2 % (ref 11.5–15.5)
WBC: 6.5 10*3/uL (ref 4.0–10.5)
nRBC: 0 % (ref 0.0–0.2)

## 2022-01-26 LAB — FERRITIN: Ferritin: 106 ng/mL (ref 24–336)

## 2022-01-26 LAB — IRON AND TIBC
Iron: 84 ug/dL (ref 45–182)
Saturation Ratios: 28 % (ref 17.9–39.5)
TIBC: 304 ug/dL (ref 250–450)
UIBC: 220 ug/dL

## 2022-01-28 ENCOUNTER — Encounter: Payer: Self-pay | Admitting: Oncology

## 2022-01-28 NOTE — Progress Notes (Signed)
Hematology/Oncology Consult note [facility]  Telephone:([phone] [phone]  Patient Care Team: Antionette Char, MD as PCP - General (Family Medicine) Wellington Hampshire, MD as PCP - Cardiology (Cardiology) Deboraha Sprang, MD as PCP - Electrophysiology (Cardiology) Wellington Hampshire, MD as Consulting Physician (Cardiology) Daneil Dolin, MD as Consulting Physician (Gastroenterology)   Name of the patient: Matthew [name]  [id number]  [birth year]   Date of visit: 01/28/22  Diagnosis- iron deficiency anemia  Chief complaint/ Reason for visit-follow-up of iron deficiency anemia  Heme/Onc history: patient is a 86 year old male with past medical history significant for severe aortic stenosis, mild thrombocytopenia among other medical problems.  He was recently admitted to the hospital for acute symptomatic anemia from upper GI bleed when his hemoglobin dropped down from 10.6-7.  Labs indicated severe iron deficiency.  He underwent EGD and colonoscopy.  Upper endoscopy showed several gastric AVMs which were treated with APC.  2 cm hiatal hernia was also noted.  Colonoscopy showed a large flat polyp in the cecum that was not resected due to poor prep.  Patient has seen Dr. Marius Ditch who plans to do a repeat colonoscopy in January 2020.  Patient was also found to have low B12 levels of 128.  Patient received B12 shots as well as IV iron with normalization of his hemoglobin  Interval history-patient is quite active for his age.  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  HENT:  Negative for congestion, ear discharge and nosebleeds.   Eyes:  Negative for blurred vision.  Respiratory:  Negative for cough, hemoptysis, sputum production, shortness of breath and wheezing.   Cardiovascular:  Negative  for chest pain, palpitations, orthopnea and claudication.  Gastrointestinal:  Negative for abdominal pain, blood in stool, constipation, diarrhea, heartburn, melena, nausea and vomiting.  Genitourinary:  Negative for dysuria, flank pain, frequency, hematuria and urgency.  Musculoskeletal:  Negative for back pain, joint pain and myalgias.  Skin:  Negative for rash.  Neurological:  Negative for dizziness, tingling, focal weakness, seizures, weakness and headaches.  Endo/Heme/Allergies:  Does not bruise/bleed easily.  Psychiatric/Behavioral:  Negative for depression and suicidal ideas. The patient does not have insomnia.      Allergies  Allergen Reactions   Ranitidine Rash   Ranexa [Ranolazine Er] Rash     Past Medical History:  Diagnosis Date   Alcohol abuse    stopped drinking 42 years ago from today 2020   Anemia    AS (aortic stenosis)    a. Echo 6/10: EF 55% mild AS; b. echo 06/2015; EF 55-60%, GR1DD, moderate AS, Peak velocity (S): 346 cm/s. Mean gradientS): 28 mm Hg. Peak gradient (S): 48 mm Hg. Valve area (VTI): 1.18 cm2;   c. Echo 4/17 - mild LVH, EF 55-60%, no RWMA, Gr 1 DD, mod to severe AS (mean 28 mmHg, peak 46 mmHg), mild LAE   CAD (coronary artery disease)    a. MI 1996 w/ CABG 1996; b.redo CABG 06/2003; c. Myoview 7/09: EF 54% inferobasal infarct, no ischemia. Myoview 6/10 EF 43% inf wall infarct. no ischemia; c. cath 8/16 s/p DES to VG-OM2. OTW 3VD w/ patent VG->RPDA, VG->OM3,  & LIMA->LAD.  EF 55-65%.   Carotid bruit    2009 0-39% on dopplers bilatrally   Chest pain  GERD (gastroesophageal reflux disease)    History of kidney stones    HOH (hard of hearing)    HTN (hypertension)    Hyperlipidemia    Myocardial infarction ([place]) 1996   Nephrolithiasis    OSA (obstructive sleep apnea)    PAD (peripheral artery disease) ([place]) 02/2014   Subtotal occlusion of right common iliac artery and 70% stenosis in the left common iliac artery. Status post bilateral kissing stent  placement. Significant post stenosis aneurysmal dilatation on the right side (any future catheterization through the right femoral artery should be done cautiously to avoid advancing the wire behind the stent struts)   Shortness of breath    with exertion   Squamous cell carcinoma of skin 12/17/2020   R angle of mandible - SCCIS, ED&C 01/22/2021   Squamous cell carcinoma of skin 12/17/2020   R upper arm SCCIS arising in AK, ED&C 01/22/2021   Squamous cell carcinoma of skin 12/17/2020   R chest - SCCIS, ED&C 01/22/2021   Thrombocytopenia ([place])      Past Surgical History:  Procedure Laterality Date   ABDOMINAL AORTAGRAM N/A 03/06/2014   Procedure: ABDOMINAL Maxcine Ham;  Surgeon: Wellington Hampshire, MD;  Location: [place] CATH LAB;  Service: Cardiovascular;  Laterality: N/A;   BIV PACEMAKER INSERTION CRT-P N/A 07/23/2019   Procedure: BIV PACEMAKER INSERTION CRT-P;  Surgeon: Deboraha Sprang, MD;  Location: [place] CV LAB;  Service: Cardiovascular;  Laterality: N/A;   CARDIAC CATHETERIZATION  02/2014   Severe three-vessel coronary artery disease with patent grafts.    CARDIAC CATHETERIZATION N/A 07/31/2015   Procedure: Right/Left Heart Cath and Coronary/Graft Angiography;  Surgeon: Wellington Hampshire, MD;  Location: [place] CV LAB;  Service: Cardiovascular;  Laterality: N/A;   CARDIAC CATHETERIZATION N/A 07/31/2015   Procedure: Coronary Stent Intervention;  Surgeon: Wellington Hampshire, MD;  Location: [place] CV LAB;  Service: Cardiovascular;  Laterality: N/A;   CARDIAC CATHETERIZATION N/A 05/26/2016   Procedure: Right/Left Heart Cath and Coronary Angiography;  Surgeon: Wellington Hampshire, MD;  Location: [place] CV LAB;  Service: Cardiovascular;  Laterality: N/A;   COLONOSCOPY WITH PROPOFOL N/A 10/07/2018   Procedure: COLONOSCOPY WITH PROPOFOL;  Surgeon: Lin Landsman, MD;  Location: [facility] ENDOSCOPY;  Service: Gastroenterology;  Laterality: N/A;   COLONOSCOPY WITH PROPOFOL N/A 01/08/2019    Procedure: COLONOSCOPY WITH PROPOFOL;  Surgeon: Lin Landsman, MD;  Location: [facility] ENDOSCOPY;  Service: Gastroenterology;  Laterality: N/A;   CORONARY ARTERY BYPASS GRAFT  1991   at [facility]. Redone 2004-3 vessels 1st time and 4 second time   CYSTO     2/3 times for kidney stones   ENTEROSCOPY N/A 12/25/2018   Procedure: ENTEROSCOPY Single balloon;  Surgeon: Lin Landsman, MD;  Location: [place];  Service: Gastroenterology;  Laterality: N/A;   ESOPHAGOGASTRODUODENOSCOPY (EGD) WITH PROPOFOL N/A 10/07/2018   Procedure: ESOPHAGOGASTRODUODENOSCOPY (EGD) WITH PROPOFOL;  Surgeon: Lin Landsman, MD;  Location: [facility] ENDOSCOPY;  Service: Gastroenterology;  Laterality: N/A;   GIVENS CAPSULE STUDY N/A 12/22/2018   Procedure: GIVENS CAPSULE STUDY;  Surgeon: Lin Landsman, MD;  Location: [facility] ENDOSCOPY;  Service: Gastroenterology;  Laterality: N/A;   INGUINAL HERNIA REPAIR  11/10/2011   Procedure: HERNIA REPAIR INGUINAL ADULT;  Surgeon: Donato Heinz;  Location: [facility];  Service: General;  Laterality: Right;   LAPAROSCOPIC RIGHT COLECTOMY Right 02/09/2019   Procedure: LAPAROSCOPIC RIGHT COLECTOMY;  Surgeon: Jules Husbands, MD;  Location: [facility] ORS;  Service: General;  Laterality: Right;  LEFT HEART CATH AND CORONARY ANGIOGRAPHY Left 05/15/2018   Procedure: LEFT HEART CATH AND CORONARY ANGIOGRAPHY;  Surgeon: Wellington Hampshire, MD;  Location: [place] CV LAB;  Service: Cardiovascular;  Laterality: Left;   LEFT HEART CATHETERIZATION WITH CORONARY/GRAFT ANGIOGRAM N/A 03/06/2014   Procedure: LEFT HEART CATHETERIZATION WITH Beatrix Fetters;  Surgeon: Wellington Hampshire, MD;  Location: [place] CATH LAB;  Service: Cardiovascular;  Laterality: N/A;   RIGHT HEART CATH AND CORONARY/GRAFT ANGIOGRAPHY N/A 09/05/2020   Procedure: RIGHT HEART CATH AND CORONARY/GRAFT ANGIOGRAPHY;  Surgeon: Wellington Hampshire, MD;  Location: [place] CV LAB;  Service: Cardiovascular;  Laterality: N/A;    STOMACH SURGERY     removal of gastric ulcers   TEE WITHOUT CARDIOVERSION N/A 06/01/2016   Procedure: TRANSESOPHAGEAL ECHOCARDIOGRAM (TEE);  Surgeon: Sherren Mocha, MD;  Location: [place];  Service: Open Heart Surgery;  Laterality: N/A;   TRANSCATHETER AORTIC VALVE REPLACEMENT, TRANSFEMORAL N/A 06/01/2016   Procedure: TRANSCATHETER AORTIC VALVE REPLACEMENT, TRANSFEMORAL;  Surgeon: Sherren Mocha, MD;  Location: [place];  Service: Open Heart Surgery;  Laterality: N/A;    Social History   Socioeconomic History   Marital status: Married    Spouse name: Not on file   Number of children: 2   Years of education: Not on file   Highest education level: Not on file  Occupational History   Occupation: Retired  Tobacco Use   Smoking status: Former    Packs/day: 1.00    Years: 50.00    Pack years: 50.00    Types: Cigarettes    Quit date: 01/24/1990    Years since quitting: 32.0   Smokeless tobacco: Never  Vaping Use   Vaping Use: Never used  Substance and Sexual Activity   Alcohol use: No    Alcohol/week: 0.0 standard drinks    Comment: Alcoholic in past   Drug use: No   Sexual activity: Never  Other Topics Concern   Not on file  Social History Narrative   Married an lives with wife in [place]. Retired form [employer].    Social Determinants of Health   Financial Resource Strain: Not on file  Food Insecurity: Not on file  Transportation Needs: Not on file  Physical Activity: Not on file  Stress: Not on file  Social Connections: Not on file  Intimate Partner Violence: Not on file    Family History  Problem Relation Age of Onset   Heart disease Father    Heart disease Mother    Colon cancer Neg Hx      Current Outpatient Medications:    acetaminophen (TYLENOL) 325 MG tablet, Take 650 mg by mouth every 6 (six) hours as needed for mild pain or headache. , Disp: , Rfl:    aspirin EC 81 MG tablet, Take 1 tablet (81 mg total) by mouth daily., Disp: 90 tablet, Rfl: 3    atorvastatin (LIPITOR) 80 MG tablet, TAKE 1 TABLET BY MOUTH  DAILY AT 6 PM, Disp: 90 tablet, Rfl: 0   carvedilol (COREG) 3.125 MG tablet, TAKE 1 TABLET BY MOUTH  TWICE DAILY, Disp: 180 tablet, Rfl: 3   clopidogrel (PLAVIX) 75 MG tablet, clopidogrel 75 mg tablet, Disp: , Rfl:    ENTRESTO 97-103 MG, TAKE ONE-HALF TABLET BY  MOUTH TWICE DAILY, Disp: 90 tablet, Rfl: 0   ezetimibe (ZETIA) 10 MG tablet, TAKE 1 TABLET BY MOUTH  DAILY, Disp: 90 tablet, Rfl: 3   isosorbide mononitrate (IMDUR) 60 MG 24 hr tablet, TAKE 1 TABLET BY MOUTH  DAILY, Disp: 90 tablet, Rfl: 0   Multiple Vitamins-Minerals (PRESERVISION AREDS PO), Take 1 tablet by mouth daily. , Disp: , Rfl:    niacinamide 500 MG tablet, Take 500 mg by mouth 2 (two) times daily with a meal., Disp: , Rfl:    pantoprazole (PROTONIX) 40 MG tablet, TAKE 1 TABLET BY MOUTH  DAILY, Disp: 90 tablet, Rfl: 0   Polypodium Leucotomos (HELIOCARE PO), Take by mouth., Disp: , Rfl:    potassium chloride SA (KLOR-CON M20) 20 MEQ tablet, Take 1 tablet (20 mEq total) by mouth daily., Disp: 90 tablet, Rfl: 3   tamsulosin (FLOMAX) 0.4 MG CAPS capsule, Take 1 capsule (0.4 mg total) by mouth daily. Future refill request will need to be sent to the pcp, Disp: 90 capsule, Rfl: 1   tetrahydrozoline 0.05 % ophthalmic solution, Place 2 drops into both eyes 4 (four) times daily as needed (for dry eyes)., Disp: , Rfl:    TURMERIC PO, Take 1 capsule by mouth at bedtime. , Disp: , Rfl:    vitamin B-12 (CYANOCOBALAMIN) 1000 MCG tablet, Take 1,000 mcg by mouth daily., Disp: , Rfl:    mupirocin ointment (BACTROBAN) 2 %, Apply 1 application topically daily. (Patient not taking: Reported on 01/26/2022), Disp: 22 g, Rfl: 1 No current facility-administered medications for this visit.  Facility-Administered Medications Ordered in Other Visits:    cyanocobalamin ((VITAMIN B-12)) injection 1,000 mcg, 1,000 mcg, Intramuscular, Weekly, Sindy Guadeloupe, MD, 1,000 mcg at 02/25/20 1447  Physical  exam:  Vitals:   01/26/22 1519  BP: 122/62  Pulse: 70  Resp: 16  Temp: 98.2 F (36.8 C)  TempSrc: Tympanic  SpO2: 94%  Weight: 204 lb (92.5 kg)  Height: 5\' 9"  (1.753 m)   Physical Exam Cardiovascular:     Rate and Rhythm: Normal rate and regular rhythm.     Heart sounds: Normal heart sounds.  Pulmonary:     Effort: Pulmonary effort is normal.     Breath sounds: Normal breath sounds.  Musculoskeletal:     Cervical back: Normal range of motion.  Skin:    General: Skin is warm and dry.  Neurological:     Mental Status: He is alert and oriented to person, place, and time.     CMP Latest Ref Rng & Units 01/07/2022  Glucose 70 - 99 mg/dL 165(H)  BUN 8 - 27 mg/dL 12  Creatinine 0.76 - 1.27 mg/dL 1.31(H)  Sodium 134 - 144 mmol/L 142  Potassium 3.5 - 5.2 mmol/L 4.2  Chloride 96 - 106 mmol/L 105  CO2 20 - 29 mmol/L 25  Calcium 8.6 - 10.2 mg/dL 9.9  Total Protein 6.0 - 8.5 g/dL 6.6  Total Bilirubin 0.0 - 1.2 mg/dL 1.8(H)  Alkaline Phos 44 - 121 IU/L 94  AST 0 - 40 IU/L 25  ALT 0 - 44 IU/L 45(H)   CBC Latest Ref Rng & Units 01/26/2022  WBC 4.0 - 10.5 K/uL 6.5  Hemoglobin 13.0 - 17.0 g/dL 14.5  Hematocrit 39.0 - 52.0 % 44.2  Platelets 150 - 400 K/uL 103(L)    No images are attached to the encounter.  ECHOCARDIOGRAM COMPLETE  Result Date: 01/07/2022    ECHOCARDIOGRAM REPORT   Patient Name:   CONNER MUEGGE Date of Exam: 01/07/2022 Medical Rec #:  [id number]      Height:       69.0 in Accession #:    [id number]     Weight:       204.2 lb Date  of Birth:  May 13, 1936     BSA:          2.085 m Patient Age:    78 years       BP:           102/64 mmHg Patient Gender: M              HR:           70 bpm. Exam Location:  [place] Procedure: 2D Echo, Cardiac Doppler, Color Doppler and Intracardiac            Opacification Agent Indications:    I35.8 Other nonrheumatic aortic valve disorders  History:        Patient has prior history of Echocardiogram examinations, most                  recent 09/02/2020. CHF, Pacemaker, PAD, Aortic Valve Disease,                 Arrythmias:LBBB and PVC; Risk Factors:Hypertension,                 Dyslipidemia, Sleep Apnea and Former Smoker.                 Aortic Valve: 26 mm Sapien prosthetic, stented (TAVR) valve is                 present in the aortic position. Procedure Date: 2017.  Sonographer:    Pilar Jarvis RDMS, RVT, RDCS Referring Phys: [place]  1. Left ventricular ejection fraction, by estimation, is 40 to 45%. The left ventricle has mild to moderately decreased function. The left ventricle demonstrates global hypokinesis. Left ventricular diastolic parameters are consistent with Grade I diastolic dysfunction (impaired relaxation).  2. Right ventricular systolic function is normal. The right ventricular size is normal.  3. Left atrial size was mildly dilated.  4. Right atrial size was mildly dilated.  5. The mitral valve is degenerative. No evidence of mitral valve regurgitation.  6. The aortic valve has been repaired/replaced. Aortic valve regurgitation is not visualized. There is a 26 mm Sapien prosthetic (TAVR) valve present in the aortic position. Procedure Date: 2017. Echo findings are consistent with normal structure and function of the aortic valve prosthesis. Aortic valve mean gradient measures 10.0 mmHg. FINDINGS  Left Ventricle: Left ventricular ejection fraction, by estimation, is 40 to 45%. The left ventricle has mild to moderately decreased function. The left ventricle demonstrates global hypokinesis. Definity contrast agent was given IV to delineate the left  ventricular endocardial borders. The left ventricular internal cavity size was normal in size. There is no left ventricular hypertrophy. Left ventricular diastolic parameters are consistent with Grade I diastolic dysfunction (impaired relaxation). Right Ventricle: The right ventricular size is normal. No increase in right ventricular wall thickness. Right  ventricular systolic function is normal. Left Atrium: Left atrial size was mildly dilated. Right Atrium: Right atrial size was mildly dilated. Pericardium: There is no evidence of pericardial effusion. Mitral Valve: The mitral valve is degenerative in appearance. There is mild thickening of the mitral valve leaflet(s). Mild to moderate mitral annular calcification. No evidence of mitral valve regurgitation. Tricuspid Valve: The tricuspid valve is grossly normal. Tricuspid valve regurgitation is not demonstrated. Aortic Valve: The aortic valve has been repaired/replaced. Aortic valve regurgitation is not visualized. Aortic valve mean gradient measures 10.0 mmHg. Aortic valve peak gradient measures 16.1 mmHg. Aortic valve area, by VTI measures 1.66 cm. There is a  26 mm Sapien prosthetic, stented (TAVR) valve present in the aortic position. Procedure Date: 2017. Echo findings are consistent with normal structure and function of the aortic valve prosthesis. Pulmonic Valve: The pulmonic valve was not well visualized. Pulmonic valve regurgitation is mild. Aorta: The aortic root and ascending aorta are structurally normal, with no evidence of dilitation. Venous: The inferior vena cava was not well visualized. IAS/Shunts: No atrial level shunt detected by color flow Doppler. Additional Comments: A device lead is visualized.  LEFT VENTRICLE PLAX 2D LVOT diam:     1.90 cm   Diastology LV SV:         75        LV e' medial:    4.57 cm/s LV SV Index:   36        LV E/e' medial:  11.2 LVOT Area:     2.84 cm  LV e' lateral:   4.90 cm/s                          LV E/e' lateral: 10.4  RIGHT VENTRICLE RV S prime:     12.10 cm/s TAPSE (M-mode): 2.1 cm LEFT ATRIUM             Index        RIGHT ATRIUM           Index LA Vol (A2C):   85.3 ml 40.92 ml/m  RA Area:     24.80 cm LA Vol (A4C):   62.5 ml 29.98 ml/m  RA Volume:   81.30 ml  39.00 ml/m LA Biplane Vol: 72.9 ml 34.97 ml/m  AORTIC VALVE AV Area (Vmax):    1.65 cm AV Area  (Vmean):   1.56 cm AV Area (VTI):     1.66 cm AV Vmax:           200.75 cm/s AV Vmean:          148.250 cm/s AV VTI:            0.452 m AV Peak Grad:      16.1 mmHg AV Mean Grad:      10.0 mmHg LVOT Vmax:         117.00 cm/s LVOT Vmean:        81.400 cm/s LVOT VTI:          0.264 m LVOT/AV VTI ratio: 0.58  AORTA Ao Asc diam:  3.20 cm Ao Arch diam: 2.7 cm MITRAL VALVE                TRICUSPID VALVE MV Area (PHT): 1.53 cm     TR Peak grad:   18.8 mmHg MV Decel Time: 496 msec     TR Vmax:        217.00 cm/s MV E velocity: 51.00 cm/s MV A velocity: 101.00 cm/s  SHUNTS MV E/A ratio:  0.50         Systemic VTI:  0.26 m                             Systemic Diam: 1.90 cm Kate Sable MD Electronically signed by Kate Sable MD Signature Date/Time: 01/07/2022/6:19:42 PM    Final    CUP PACEART REMOTE DEVICE CHECK  Result Date: 01/26/2022 Scheduled remote reviewed. Normal device function.  AF burden <1%, all less < 1 min Next remote 91 days- JJB    Assessment and plan- Patient  is a 86 y.o. male with history of iron deficiency anemia here for routine follow-up  Patient has not required IV iron since June 2020.  Overall his hemoglobin is remained stable between 13-14 Since then.  His iron studies are presently normal and patient does not require any IV iron at this time.  Given the stability of his hemoglobin patient does not require any further follow-up with me and can continue to follow-up with his primary care doctor   Visit Diagnosis 1. Iron deficiency anemia, unspecified iron deficiency anemia type      Dr. Randa Evens, MD, MPH [facility] at [facility] [phone] 01/28/2022 11:13 AM

## 2022-02-01 NOTE — Progress Notes (Signed)
Remote pacemaker transmission.   

## 2022-03-23 ENCOUNTER — Encounter: Payer: Self-pay | Admitting: Cardiovascular Disease

## 2022-03-23 DIAGNOSIS — I25708 Atherosclerosis of coronary artery bypass graft(s), unspecified, with other forms of angina pectoris: Secondary | ICD-10-CM

## 2022-03-23 DIAGNOSIS — I5022 Chronic systolic (congestive) heart failure: Secondary | ICD-10-CM

## 2022-03-23 DIAGNOSIS — Z952 Presence of prosthetic heart valve: Secondary | ICD-10-CM

## 2022-03-23 DIAGNOSIS — Z951 Presence of aortocoronary bypass graft: Secondary | ICD-10-CM

## 2022-03-23 DIAGNOSIS — I429 Cardiomyopathy, unspecified: Secondary | ICD-10-CM

## 2022-03-23 DIAGNOSIS — I5020 Unspecified systolic (congestive) heart failure: Secondary | ICD-10-CM

## 2022-03-23 NOTE — Telephone Encounter (Signed)
Spoke with pt daughter, aware dr Fletcher Anon is out of the office, she would like this message to go to ryan dunn in the [place] office. ?

## 2022-03-23 NOTE — Telephone Encounter (Signed)
Spoke with patients daughter per release form. Reviewed recommendations from provider. She reports he is already taking 1/2 tablet twice a day on the Entresto 97/103 given the cost. Lab order placed and they will get those done at the [facility] this week. Also moved his appointment up with Dr. Fletcher Anon to 03/30/22 at 4 pm in the office. She did want to speak with [name] and requested I let him know that he is getting 16 more chickens tomorrow and still cutting wood. Reassured that I would pass that on to him. She was appreciative for the call with no further questions at this time.  ? ?Please call [name] and let her know I have reviewed the message and most recent note/echo/labs. I would like for him to come in for BMET and CBC. His last renal function showed some mild dehydration, which may be contributing if this is persisting. In the meantime, we should decrease his Entresto to 49/51 mg bid. Please move up his follow up with his primary cardiologist. Thanks.  ?

## 2022-03-25 ENCOUNTER — Other Ambulatory Visit
Admission: RE | Admit: 2022-03-25 | Discharge: 2022-03-25 | Disposition: A | Discharge status: Home / Self Care | Payer: Medicare Other | Attending: Cardiovascular Disease | Admitting: Cardiovascular Disease

## 2022-03-25 DIAGNOSIS — I25708 Atherosclerosis of coronary artery bypass graft(s), unspecified, with other forms of angina pectoris: Secondary | ICD-10-CM | POA: Insufficient documentation

## 2022-03-25 DIAGNOSIS — I429 Cardiomyopathy, unspecified: Secondary | ICD-10-CM | POA: Diagnosis present

## 2022-03-25 DIAGNOSIS — I5020 Unspecified systolic (congestive) heart failure: Secondary | ICD-10-CM | POA: Insufficient documentation

## 2022-03-25 DIAGNOSIS — Z952 Presence of prosthetic heart valve: Secondary | ICD-10-CM | POA: Diagnosis present

## 2022-03-25 DIAGNOSIS — Z951 Presence of aortocoronary bypass graft: Secondary | ICD-10-CM | POA: Diagnosis present

## 2022-03-25 DIAGNOSIS — I5022 Chronic systolic (congestive) heart failure: Secondary | ICD-10-CM | POA: Insufficient documentation

## 2022-03-25 LAB — CBC
HCT: 44.1 % (ref 39.0–52.0)
Hemoglobin: 14.7 g/dL (ref 13.0–17.0)
MCH: 30.2 pg (ref 26.0–34.0)
MCHC: 33.3 g/dL (ref 30.0–36.0)
MCV: 90.7 fL (ref 80.0–100.0)
Platelets: 114 10*3/uL — ABNORMAL LOW (ref 150–400)
RBC: 4.86 MIL/uL (ref 4.22–5.81)
RDW: 12.8 % (ref 11.5–15.5)
WBC: 6.6 10*3/uL (ref 4.0–10.5)
nRBC: 0 % (ref 0.0–0.2)

## 2022-03-25 LAB — BASIC METABOLIC PANEL
Anion gap: 6 (ref 5–15)
BUN: 17 mg/dL (ref 8–23)
CO2: 24 mmol/L (ref 22–32)
Calcium: 9.4 mg/dL (ref 8.9–10.3)
Chloride: 106 mmol/L (ref 98–111)
Creatinine, Ser: 1.13 mg/dL (ref 0.61–1.24)
GFR, Estimated: >60 mL/min (ref >60)
Glucose, Bld: 152 mg/dL — ABNORMAL HIGH (ref 70–99)
Potassium: 3.9 mmol/L (ref 3.5–5.1)
Sodium: 136 mmol/L (ref 135–145)

## 2022-03-29 ENCOUNTER — Other Ambulatory Visit: Payer: Self-pay | Admitting: Cardiovascular Disease

## 2022-03-29 ENCOUNTER — Other Ambulatory Visit: Payer: Self-pay | Admitting: Internal Medicine

## 2022-03-30 ENCOUNTER — Encounter: Payer: Self-pay | Admitting: Cardiovascular Disease

## 2022-03-30 ENCOUNTER — Ambulatory Visit: Payer: Medicare Other | Admitting: Cardiovascular Disease

## 2022-03-30 VITALS — BP 140/78 | HR 73 | Ht 69.0 in | Wt 202.5 lb

## 2022-03-30 DIAGNOSIS — I25118 Atherosclerotic heart disease of native coronary artery with other forms of angina pectoris: Secondary | ICD-10-CM

## 2022-03-30 DIAGNOSIS — Z952 Presence of prosthetic heart valve: Secondary | ICD-10-CM

## 2022-03-30 DIAGNOSIS — I739 Peripheral vascular disease, unspecified: Secondary | ICD-10-CM

## 2022-03-30 DIAGNOSIS — I10 Essential (primary) hypertension: Secondary | ICD-10-CM | POA: Diagnosis not present

## 2022-03-30 DIAGNOSIS — I5022 Chronic systolic (congestive) heart failure: Secondary | ICD-10-CM

## 2022-03-30 DIAGNOSIS — E785 Hyperlipidemia, unspecified: Secondary | ICD-10-CM

## 2022-03-30 MED ORDER — SPIRONOLACTONE 25 MG PO TABS: 12.5000 mg | ORAL_TABLET | Freq: Every day | ORAL | 1 refills | Status: DC

## 2022-03-30 MED ORDER — PANTOPRAZOLE SODIUM 40 MG PO TBEC: 40.0000 mg | DELAYED_RELEASE_TABLET | Freq: Every day | ORAL | 0 refills | Status: DC

## 2022-03-30 MED ORDER — ATORVASTATIN CALCIUM 80 MG PO TABS: ORAL_TABLET | ORAL | 0 refills | Status: DC

## 2022-03-30 MED ORDER — ENTRESTO 24-26 MG PO TABS: 1.0000 | ORAL_TABLET | Freq: Two times a day (BID) | ORAL | 1 refills | Status: DC

## 2022-03-30 NOTE — Telephone Encounter (Signed)
Pt currently taking Entresto 97-103 mg tablet 1/2 bid. ?Should I send in new Rx for Entresto 49/51 mg tablet bid instead? ?I also saw recommendation from R.Dunn, PA considering lowering to Entresto 24-26 mg tablet bid if BP continues to lower. ?Please advise for New Rx. ?

## 2022-03-30 NOTE — Progress Notes (Signed)
?  ?Cardiology Office Note ? ? ?Date:  03/30/2022  ? ?ID:  Matthew [name], DOB 1936/09/30, MRN [id number] ? ?PCP:  Antionette Char, MD  ?Cardiologist:   [name], MD  ? ?Chief Complaint  ?Patient presents with  ? Other  ?  Low BP. Meds reviewed verbally with pt.  ? ? ? ?  ?History of Present Illness: ?Matthew [name] is a 86 y.o. male who presents for a follow-up visit regarding coronary artery disease , aortic stenosis, chronic systolic heart failure and peripheral arterial disease. ?He is status post CABG twice in 1991 and 2004. He is status post bilateral common iliac artery kissing stent placement in 2015. ?He is status post TAVR in June 2017. Cardiac catheterization before that showed patent grafts. ?He developed left bundle branch block post TAVR and this correlated with a gradual decline in his ejection fraction.  Before TAVR, his EF was 55 to 60%.  Shortly after TAVR, his EF was 55%.  In 2018, his EF was noted to be 45 to 50% and then dropped to 30 to 35% in 2019. ? ?He had cardiac catheterization 2019 for worsening angina.  It showed significant underlying three-vessel coronary artery disease with patent LIMA to LAD, patent RIMA to diagonal, patent SVG to OM 3 and patent SVG to right PDA.  The SVG to OM 2 was found to be occluded.  However, OM 2 had retrograde flow from SVG to 3.  There was possible left subclavian artery stenosis but it was not significant by gradient.  This was also interrogated by carotid Doppler and there was nothing to suggest significant left subclavian stenosis. ?He has known history of frequent PVCs that improved with treatment. ? ?He was hospitalized in October, 2019 with GI bleed and blood loss anemia.  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  His anemia improved with IV iron. ? ?Echocardiogram in March of 2020 showed an EF of 25 to 30%, mild aortic stenosis with mean gradient of 17 mmHg and mild to moderate aortic regurgitation. ?The patient had biventricular  pacemaker placement in August 2020 due to persistently low ejection fraction and left bundle branch block.  Repeat echocardiogram in September 2021 showed improvement in ejection fraction to 40 to 45%. ? ?He had worsening angina last year and had repeat cardiac catheterization in September 2021 which showed no significant change in coronary anatomy when compared to previous catheterization in 2019.  He was noted to have progression of native small vessel disease.  Right heart catheterization showed normal filling pressures, normal pulmonary pressure and normal cardiac output. ? ?He had an echocardiogram done in January of this year which showed an EF of 40 to 45% with normal functioning TAVR prosthesis and mean gradient of 10 mmHg. ? ?He has been having issues with significant hypotension which required gradual decrease of the dose of Entresto.  Even after cutting down the dose to 49/51 mg twice daily, he continued to have intermittent hypotension with systolic blood pressure below 100.  No chest pain or worsening dyspnea.  He had recent labs which showed stable creatinine at 1.13 with normal BUN.  CBC showed no evidence of anemia. ? ?Past Medical History:  ?Diagnosis Date  ? Alcohol abuse   ? stopped drinking 42 years ago from today 2020  ? Anemia   ? AS (aortic stenosis)   ? a. Echo 6/10: EF 55% mild AS; b. echo 06/2015; EF 55-60%, GR1DD, moderate AS, Peak velocity (S): 346  cm/s. Mean gradientS): 28 mm Hg. Peak gradient (S): 48 mm Hg. Valve area (VTI): 1.18 cm2;   c. Echo 4/17 - mild LVH, EF 55-60%, no RWMA, Gr 1 DD, mod to severe AS (mean 28 mmHg, peak 46 mmHg), mild LAE  ? CAD (coronary artery disease)   ? a. MI 1996 w/ CABG 1996; b.redo CABG 06/2003; c. Myoview 7/09: EF 54% inferobasal infarct, no ischemia. Myoview 6/10 EF 43% inf wall infarct. no ischemia; c. cath 8/16 s/p DES to VG-OM2. OTW 3VD w/ patent VG->RPDA, VG->OM3,  & LIMA->LAD.  EF 55-65%.  ? Carotid bruit   ? 2009 0-39% on dopplers bilatrally  ? Chest  pain   ? GERD (gastroesophageal reflux disease)   ? History of kidney stones   ? HOH (hard of hearing)   ? HTN (hypertension)   ? Hyperlipidemia   ? Myocardial infarction [facility]) 1996  ? Nephrolithiasis   ? OSA (obstructive sleep apnea)   ? PAD (peripheral artery disease) ([place]) 02/2014  ? Subtotal occlusion of right common iliac artery and 70% stenosis in the left common iliac artery. Status post bilateral kissing stent placement. Significant post stenosis aneurysmal dilatation on the right side (any future catheterization through the right femoral artery should be done cautiously to avoid advancing the wire behind the stent struts)  ? Shortness of breath   ? with exertion  ? Squamous cell carcinoma of skin 12/17/2020  ? R angle of mandible - SCCIS, ED&C 01/22/2021  ? Squamous cell carcinoma of skin 12/17/2020  ? R upper arm SCCIS arising in [place], ED&C 01/22/2021  ? Squamous cell carcinoma of skin 12/17/2020  ? R chest - SCCIS, ED&C 01/22/2021  ? Thrombocytopenia ([place])   ? ? ?Past Surgical History:  ?Procedure Laterality Date  ? ABDOMINAL AORTAGRAM N/A 03/06/2014  ? Procedure: ABDOMINAL AORTAGRAM;  Surgeon: Wellington Hampshire, MD;  Location: [facility] CATH LAB;  Service: Cardiovascular;  Laterality: N/A;  ? BIV PACEMAKER INSERTION CRT-P N/A 07/23/2019  ? Procedure: BIV PACEMAKER INSERTION CRT-P;  Surgeon: Deboraha Sprang, MD;  Location: [place] CV LAB;  Service: Cardiovascular;  Laterality: N/A;  ? CARDIAC CATHETERIZATION  02/2014  ? Severe three-vessel coronary artery disease with patent grafts.   ? CARDIAC CATHETERIZATION N/A 07/31/2015  ? Procedure: Right/Left Heart Cath and Coronary/Graft Angiography;  Surgeon: Wellington Hampshire, MD;  Location: [place] CV LAB;  Service: Cardiovascular;  Laterality: N/A;  ? CARDIAC CATHETERIZATION N/A 07/31/2015  ? Procedure: Coronary Stent Intervention;  Surgeon: Wellington Hampshire, MD;  Location: [place] CV LAB;  Service: Cardiovascular;  Laterality: N/A;  ? CARDIAC CATHETERIZATION  N/A 05/26/2016  ? Procedure: Right/Left Heart Cath and Coronary Angiography;  Surgeon: Wellington Hampshire, MD;  Location: [place] CV LAB;  Service: Cardiovascular;  Laterality: N/A;  ? COLONOSCOPY WITH PROPOFOL N/A 10/07/2018  ? Procedure: COLONOSCOPY WITH PROPOFOL;  Surgeon: Lin Landsman, MD;  Location: [facility] ENDOSCOPY;  Service: Gastroenterology;  Laterality: N/A;  ? COLONOSCOPY WITH PROPOFOL N/A 01/08/2019  ? Procedure: COLONOSCOPY WITH PROPOFOL;  Surgeon: Lin Landsman, MD;  Location: [facility] ENDOSCOPY;  Service: Gastroenterology;  Laterality: N/A;  ? CORONARY ARTERY BYPASS GRAFT  1991  ? at [facility]. Redone 2004-3 vessels 1st time and 4 second time  ? CYSTO    ? 2/3 times for kidney stones  ? ENTEROSCOPY N/A 12/25/2018  ? Procedure: ENTEROSCOPY Single balloon;  Surgeon: Lin Landsman, MD;  Location: [facility] ENDOSCOPY;  Service: Gastroenterology;  Laterality: N/A;  ? ESOPHAGOGASTRODUODENOSCOPY (  EGD) WITH PROPOFOL N/A 10/07/2018  ? Procedure: ESOPHAGOGASTRODUODENOSCOPY (EGD) WITH PROPOFOL;  Surgeon: Lin Landsman, MD;  Location: [facility] ENDOSCOPY;  Service: Gastroenterology;  Laterality: N/A;  ? GIVENS CAPSULE STUDY N/A 12/22/2018  ? Procedure: GIVENS CAPSULE STUDY;  Surgeon: Lin Landsman, MD;  Location: [facility] ENDOSCOPY;  Service: Gastroenterology;  Laterality: N/A;  ? INGUINAL HERNIA REPAIR  11/10/2011  ? Procedure: HERNIA REPAIR INGUINAL ADULT;  Surgeon: Donato Heinz;  Location: [facility] ORS;  Service: General;  Laterality: Right;  ? LAPAROSCOPIC RIGHT COLECTOMY Right 02/09/2019  ? Procedure: LAPAROSCOPIC RIGHT COLECTOMY;  Surgeon: Jules Husbands, MD;  Location: [facility] ORS;  Service: General;  Laterality: Right;  ? LEFT HEART CATH AND CORONARY ANGIOGRAPHY Left 05/15/2018  ? Procedure: LEFT HEART CATH AND CORONARY ANGIOGRAPHY;  Surgeon: Wellington Hampshire, MD;  Location: [place] CV LAB;  Service: Cardiovascular;  Laterality: Left;  ? LEFT HEART CATHETERIZATION WITH CORONARY/GRAFT ANGIOGRAM N/A  03/06/2014  ? Procedure: LEFT HEART CATHETERIZATION WITH Beatrix Fetters;  Surgeon: Wellington Hampshire, MD;  Location: [facility] CATH LAB;  Service: Cardiovascular;  Laterality: N/A;  ? RIGHT HEART CATH AND CO

## 2022-03-30 NOTE — Patient Instructions (Signed)
Medication Instructions:  ?Your physician has recommended you make the following change in your medication:  ? ?REDUCE Entresto to 24/26 mg twice a day ? ?STOP Potassium ? ?START Spironolactone 12.5 mg (1/2 tablet daily) ? ?Your cardiac medication refills have been sent to [facility] ? ? ? ?*If you need a refill on your cardiac medications before your next appointment, please call your pharmacy* ? ? ?Lab Work: ?Your physician recommends that you return for lab work ([place]) in:  1 week ? ?If you have labs (blood work) drawn today and your tests are completely normal, you will receive your results only by: ?MyChart Message (if you have MyChart) OR ?A paper copy in the mail ?If you have any lab test that is abnormal or we need to change your treatment, we will call you to review the results. ? ? ?Testing/Procedures: ?None ordered ? ? ?Follow-Up: ?At [facility], you and your health needs are our priority.  As part of our continuing mission to provide you with exceptional heart care, we have created designated Provider Care Teams.  These Care Teams include your primary Cardiologist (physician) and Advanced Practice Providers (APPs -  Physician Assistants and Nurse Practitioners) who all work together to provide you with the care you need, when you need it. ? ?We recommend signing up for the patient portal called "MyChart".  Sign up information is provided on this After Visit Summary.  MyChart is used to connect with patients for Virtual Visits (Telemedicine).  Patients are able to view lab/test results, encounter notes, upcoming appointments, etc.  Non-urgent messages can be sent to your provider as well.   ?To learn more about what you can do with MyChart, go to [email or web address].   ? ?Your next appointment:   ?3 month(s) ? ?The format for your next appointment:   ?In Person ? ?Provider:   ?Christell Faith, PA-C{ ? ? ? ? ?Other Instructions ?N/A ? ?Important Information About Sugar ? ? ? ? ? ? ?

## 2022-04-20 ENCOUNTER — Encounter: Payer: Self-pay | Admitting: Cardiovascular Disease

## 2022-04-27 ENCOUNTER — Ambulatory Visit (INDEPENDENT_AMBULATORY_CARE_PROVIDER_SITE_OTHER): Payer: Medicare Other

## 2022-04-27 DIAGNOSIS — I429 Cardiomyopathy, unspecified: Secondary | ICD-10-CM | POA: Diagnosis not present

## 2022-04-29 ENCOUNTER — Encounter: Payer: Self-pay | Admitting: Internal Medicine

## 2022-04-29 NOTE — Telephone Encounter (Signed)
I spoke with the patient daughter and she agreed to help him send missed transmission tonight.

## 2022-05-04 ENCOUNTER — Ambulatory Visit: Payer: Medicare Other | Admitting: Cardiovascular Disease

## 2022-05-12 LAB — CUP PACEART REMOTE DEVICE CHECK
Battery Remaining Longevity: 44 mo
Battery Remaining Percentage: 57 %
Battery Voltage: 2.99 V
Brady Statistic AP VP Percent: 83 %
Brady Statistic AP VS Percent: 1 %
Brady Statistic AS VP Percent: 13 %
Brady Statistic AS VS Percent: 1 %
Brady Statistic RA Percent Paced: 81 %
Date Time Interrogation Session: 20230516025038
Implantable Lead Implant Date: 20200810
Implantable Lead Implant Date: 20200810
Implantable Lead Implant Date: 20200810
Implantable Lead Location: 753858
Implantable Lead Location: 753859
Implantable Lead Location: 753860
Implantable Lead Model: 5076
Implantable Lead Model: 5076
Implantable Pulse Generator Implant Date: 20200810
Lead Channel Impedance Value: 410 Ohm
Lead Channel Impedance Value: 430 Ohm
Lead Channel Impedance Value: 540 Ohm
Lead Channel Pacing Threshold Amplitude: 0.75 V
Lead Channel Pacing Threshold Amplitude: 1 V
Lead Channel Pacing Threshold Amplitude: 1 V
Lead Channel Pacing Threshold Pulse Width: 0.4 ms
Lead Channel Pacing Threshold Pulse Width: 0.4 ms
Lead Channel Pacing Threshold Pulse Width: 1 ms
Lead Channel Sensing Intrinsic Amplitude: 1 mV
Lead Channel Sensing Intrinsic Amplitude: 12 mV
Lead Channel Setting Pacing Amplitude: 2 V
Lead Channel Setting Pacing Amplitude: 2 V
Lead Channel Setting Pacing Amplitude: 2 V
Lead Channel Setting Pacing Pulse Width: 0.4 ms
Lead Channel Setting Pacing Pulse Width: 1 ms
Lead Channel Setting Sensing Sensitivity: 2 mV
Pulse Gen Model: 3562
Pulse Gen Serial Number: 9130392

## 2022-05-13 NOTE — Progress Notes (Signed)
Remote pacemaker transmission.   

## 2022-05-31 NOTE — Progress Notes (Unsigned)
PROVIDER NOTE: Information contained herein reflects review and annotations entered in association with encounter. Interpretation of such information and data should be left to medically-trained personnel. Information provided to patient can be located elsewhere in the medical record under "Patient Instructions". Document created using STT-dictation technology, any transcriptional errors that may result from process are unintentional.    Patient: Matthew [name]  Service Category: E/M  Provider: Gaspar Cola, MD  DOB: 04-Jan-1936  DOS: 06/02/2022  Specialty: Interventional Pain Management  MRN: [id number]  Setting: Ambulatory outpatient  PCP: Antionette Char, MD  Type: Established Patient    Referring Provider: Antionette Char, MD  Location: Office  Delivery: Face-to-face     HPI  [name], a 85 y.o. year old male, is here today because of his No primary diagnosis found.. [name] primary complain today is No chief complaint on file. Last encounter: My last encounter with him was on Visit date not found. Pertinent problems: [name] has Chronic low back pain (Primary Area of Pain) (Bilateral) (L>R); Chronic lower extremity pain (Bilateral) (L>R); Chronic pain syndrome; Chronic sacroiliac joint pain (Bilateral) (L>R); Chronic hip pain; Neurogenic pain; Lumbar facet syndrome (Bilateral) (L>R); DDD (degenerative disc disease), lumbar; Lumbar facet osteoarthritis; Lumbar spondylosis with radiculopathy; Chronic lower extremity radicular pain (S1 dermatome) (Bilateral) (L>R); Lumbar spinal stenosis; Abnormal MRI, lumbar spine; Chronic lumbar radiculopathy; Spondylosis without myelopathy or radiculopathy, lumbosacral region; and Other spondylosis, sacral and sacrococcygeal region on their pertinent problem list. Pain Assessment: Severity of   is reported as a  /10. Location:    / . Onset:  . Quality:  . Timing:  . Modifying factor(s):  Marland Kitchen Vitals:  vitals were not taken for this visit.   Reason for  encounter:  *** . ***  Pharmacotherapy Assessment  Analgesic: Tramadol 50 mg 1 tablet by mouth every 8 hours (150 mg/day of tramadol) (15 MME/day) MME/day: 15 mg/day.   Monitoring: [place] PMP: PDMP reviewed during this encounter.       Pharmacotherapy: No side-effects or adverse reactions reported. Compliance: No problems identified. Effectiveness: Clinically acceptable.  No notes on file  UDS:  Summary  Date Value Ref Range Status  08/31/2017 FINAL  Final    Comment:    ==================================================================== TOXASSURE COMP DRUG ANALYSIS,UR ==================================================================== Test                             Result       Flag       Units Drug Present and Declared for Prescription Verification   Acetaminophen                  PRESENT      EXPECTED Drug Present not Declared for Prescription Verification   Tramadol                       >5102        UNEXPECTED ng/mg creat   O-Desmethyltramadol            5066         UNEXPECTED ng/mg creat   N-Desmethyltramadol            1382         UNEXPECTED ng/mg creat    Source of tramadol is a prescription medication.    O-desmethyltramadol and N-desmethyltramadol are expected    metabolites of tramadol. Drug Absent but Declared for Prescription Verification   Salicylate  Not Detected UNEXPECTED    Aspirin, as indicated in the declared medication list, is not    always detected even when used as directed.   Metoprolol                     Not Detected UNEXPECTED ==================================================================== Test                      Result    Flag   Units      Ref Range   Creatinine              98               mg/dL      >=20 ==================================================================== Declared Medications:  The flagging and interpretation on this report are based on the  following declared medications.  Unexpected results may  arise from  inaccuracies in the declared medications.  **Note: The testing scope of this panel includes these medications:  Metoprolol  **Note: The testing scope of this panel does not include small to  moderate amounts of these reported medications:  Acetaminophen  Aspirin  **Note: The testing scope of this panel does not include following  reported medications:  Atorvastatin  Clopidogrel  Hydrochlorothiazide (HCTZ)  Isosorbide Mononitrate  Lisinopril  Multivitamin  Nitroglycerin  Pantoprazole  Tamsulosin ==================================================================== For clinical consultation, please call [phone]. ====================================================================      ROS  Constitutional: Denies any fever or chills Gastrointestinal: No reported hemesis, hematochezia, vomiting, or acute GI distress Musculoskeletal: Denies any acute onset joint swelling, redness, loss of ROM, or weakness Neurological: No reported episodes of acute onset apraxia, aphasia, dysarthria, agnosia, amnesia, paralysis, loss of coordination, or loss of consciousness  Medication Review  Multiple Vitamins-Minerals, Polypodium Leucotomos, Turmeric, acetaminophen, aspirin EC, atorvastatin, carvedilol, clopidogrel, ezetimibe, isosorbide mononitrate, mupirocin ointment, niacinamide, pantoprazole, sacubitril-valsartan, spironolactone, tamsulosin, tetrahydrozoline, and vitamin B-12  History Review  Allergy: [name] is allergic to ranitidine and ranexa [ranolazine er]. Drug: [name]  reports no history of drug use. Alcohol:  reports no history of alcohol use. Tobacco:  reports that he quit smoking about 32 years ago. His smoking use included cigarettes. He has a 50.00 pack-year smoking history. He has never used smokeless tobacco. Social: [name]  reports that he quit smoking about 32 years ago. His smoking use included cigarettes. He has a 50.00 pack-year smoking history. He  has never used smokeless tobacco. He reports that he does not drink alcohol and does not use drugs. Medical:  has a past medical history of Alcohol abuse, Anemia, AS (aortic stenosis), CAD (coronary artery disease), Carotid bruit, Chest pain, GERD (gastroesophageal reflux disease), History of kidney stones, HOH (hard of hearing), HTN (hypertension), Hyperlipidemia, Myocardial infarction ([place]) (1996), Nephrolithiasis, OSA (obstructive sleep apnea), PAD (peripheral artery disease) ([place]) (02/2014), Shortness of breath, Squamous cell carcinoma of skin (12/17/2020), Squamous cell carcinoma of skin (12/17/2020), Squamous cell carcinoma of skin (12/17/2020), and Thrombocytopenia ([place]). Surgical: Matthew [name]  has a past surgical history that includes Stomach surgery; Coronary artery bypass graft (1991); Inguinal hernia repair (11/10/2011); left heart catheterization with coronary/graft angiogram (N/A, 03/06/2014); abdominal aortagram (N/A, 03/06/2014); Cardiac catheterization (02/2014); Cardiac catheterization (N/A, 07/31/2015); Cardiac catheterization (N/A, 07/31/2015); Cardiac catheterization (N/A, 05/26/2016); Cysto; Transcatheter aortic valve replacement, transfemoral (N/A, 06/01/2016); TEE without cardioversion (N/A, 06/01/2016); LEFT HEART CATH AND CORONARY ANGIOGRAPHY (Left, 05/15/2018); Esophagogastroduodenoscopy (egd) with propofol (N/A, 10/07/2018); Colonoscopy with propofol (N/A, 10/07/2018); Givens capsule study (N/A, 12/22/2018); enteroscopy (N/A, 12/25/2018); Colonoscopy with  propofol (N/A, 01/08/2019); Laparoscopic right colectomy (Right, 02/09/2019); BIV PACEMAKER INSERTION CRT-P (N/A, 07/23/2019); and RIGHT HEART CATH AND CORONARY/GRAFT ANGIOGRAPHY (N/A, 09/05/2020). Family: family history includes Heart disease in his father and mother.  Laboratory Chemistry Profile   Renal Lab Results  Component Value Date   BUN 17 03/25/2022   CREATININE 1.13 03/25/2022   BCR 9 (L) 01/07/2022   GFRAA >60 09/03/2020   GFRNONAA  >60 03/25/2022    Hepatic Lab Results  Component Value Date   AST 25 01/07/2022   ALT 45 (H) 01/07/2022   ALBUMIN 4.5 01/07/2022   ALKPHOS 94 01/07/2022    Electrolytes Lab Results  Component Value Date   NA 136 03/25/2022   K 3.9 03/25/2022   CL 106 03/25/2022   CALCIUM 9.4 03/25/2022   MG 2.0 02/10/2019   PHOS 1.7 (L) 02/10/2019    Bone Lab Results  Component Value Date   25OHVITD1 31 08/31/2017   25OHVITD2 <1.0 08/31/2017   25OHVITD3 31 08/31/2017    Inflammation (CRP: Acute Phase) (ESR: Chronic Phase) Lab Results  Component Value Date   CRP 0.4 08/31/2017   ESRSEDRATE 3 08/31/2017         Note: Above Lab results reviewed.  Recent Imaging Review  CUP PACEART REMOTE DEVICE CHECK Scheduled remote reviewed. Normal device function.   3 AMS, PAT, 4-8sec in duration Next remote 91 days. LA Note: Reviewed        Physical Exam  General appearance: Well nourished, well developed, and well hydrated. In no apparent acute distress Mental status: Alert, oriented x 3 (person, place, & time)       Respiratory: No evidence of acute respiratory distress Eyes: PERLA Vitals: There were no vitals taken for this visit. BMI: Estimated body mass index is 29.9 kg/m as calculated from the following:   Height as of 03/30/22: 5' 9"  (1.753 m).   Weight as of 03/30/22: 202 lb 8 oz (91.9 kg). Ideal: Patient weight not recorded  Assessment   Diagnosis Status  No diagnosis found. Controlled Controlled Controlled   Updated Problems: No problems updated.  Plan of Care  Problem-specific:  No problem-specific Assessment & Plan notes found for this encounter.  [name] has a current medication list which includes the following long-term medication(s): atorvastatin, carvedilol, ezetimibe, isosorbide mononitrate, pantoprazole, and spironolactone.  Pharmacotherapy (Medications Ordered): No orders of the defined types were placed in this encounter.  Orders:  No orders of  the defined types were placed in this encounter.  Follow-up plan:   No follow-ups on file.     Interventional management options: Planned, scheduled, and/or pending:  NOTE: Stop Plavix for 7 days for procedures.    Considering:   Possible bilateral lumbar facet RFA #1 Possible bilateral SI joint RFA #1  Diagnostic left L2 TFESI  Diagnostic bilateral L3 TFESI  Diagnostic bilateral hip injections    Palliative PRN treatment(s):   Diagnostic bilateral lumbar facet block #3  Diagnostic bilateral SI joint block #3  Therapeutic/palliative left L4-5 LESI #3        Recent Visits No visits were found meeting these conditions. Showing recent visits within past 90 days and meeting all other requirements Future Appointments Date Type Provider Dept  06/02/22 Appointment Milinda Pointer, MD [facility]  Showing future appointments within next 90 days and meeting all other requirements  I discussed the assessment and treatment plan with the patient. The patient was provided an opportunity to ask questions and all were answered. The  patient agreed with the plan and demonstrated an understanding of the instructions.  Patient advised to call back or seek an in-person evaluation if the symptoms or condition worsens.  Duration of encounter: *** minutes.  Note by: Gaspar Cola, MD Date: 06/02/2022; Time: 1:59 PM

## 2022-06-02 ENCOUNTER — Ambulatory Visit
Admission: RE | Admit: 2022-06-02 | Discharge: 2022-06-02 | Disposition: A | Discharge status: Home / Self Care | Payer: Medicare Other | Source: Ambulatory Visit | Attending: Pain Medicine | Admitting: Pain Medicine

## 2022-06-02 ENCOUNTER — Encounter: Payer: Self-pay | Admitting: Cardiovascular Disease

## 2022-06-02 ENCOUNTER — Encounter: Payer: Self-pay | Admitting: Pain Medicine

## 2022-06-02 ENCOUNTER — Telehealth: Payer: Self-pay

## 2022-06-02 ENCOUNTER — Ambulatory Visit: Payer: Medicare Other | Admitting: Pain Medicine

## 2022-06-02 VITALS — BP 127/70 | HR 70 | Temp 97.1°F | Resp 16 | Ht 69.0 in | Wt 200.0 lb

## 2022-06-02 DIAGNOSIS — R937 Abnormal findings on diagnostic imaging of other parts of musculoskeletal system: Secondary | ICD-10-CM | POA: Insufficient documentation

## 2022-06-02 DIAGNOSIS — G8929 Other chronic pain: Secondary | ICD-10-CM

## 2022-06-02 DIAGNOSIS — M545 Low back pain, unspecified: Secondary | ICD-10-CM

## 2022-06-02 DIAGNOSIS — M25551 Pain in right hip: Secondary | ICD-10-CM

## 2022-06-02 DIAGNOSIS — Z7901 Long term (current) use of anticoagulants: Secondary | ICD-10-CM | POA: Insufficient documentation

## 2022-06-02 DIAGNOSIS — M47816 Spondylosis without myelopathy or radiculopathy, lumbar region: Secondary | ICD-10-CM | POA: Insufficient documentation

## 2022-06-02 DIAGNOSIS — M533 Sacrococcygeal disorders, not elsewhere classified: Secondary | ICD-10-CM

## 2022-06-02 NOTE — Telephone Encounter (Signed)
FN notified.

## 2022-06-02 NOTE — Progress Notes (Signed)
Safety precautions to be maintained throughout the outpatient stay will include: orient to surroundings, keep bed in low position, maintain call bell within reach at all times, provide assistance with transfer out of bed and ambulation.  

## 2022-06-02 NOTE — Patient Instructions (Signed)
____________________________________________________________________________________________  Blood Thinners  IMPORTANT NOTICE:  If you take any of these, make sure to notify the nursing staff.  Failure to do so may result in injury.  Recommended time intervals to stop and restart blood-thinners, before & after invasive procedures  Generic Name Brand Name Pre-procedure. Stop this long before procedure. Post-procedure. Minimum waiting period before restarting.  Abciximab Reopro 15 days 2 hrs  Alteplase Activase 10 days 10 days  Anagrelide Agrylin    Apixaban Eliquis 3 days 6 hrs  Cilostazol Pletal 3 days 5 hrs  Clopidogrel Plavix 7-10 days 2 hrs  Dabigatran Pradaxa 5 days 6 hrs  Dalteparin Fragmin 24 hours 4 hrs  Dipyridamole Aggrenox 11days 2 hrs  Edoxaban Lixiana; Savaysa 3 days 2 hrs  Enoxaparin  Lovenox 24 hours 4 hrs  Eptifibatide Integrillin 8 hours 2 hrs  Fondaparinux  Arixtra 72 hours 12 hrs  Hydroxychloroquine Plaquenil 11 days   Prasugrel Effient 7-10 days 6 hrs  Reteplase Retavase 10 days 10 days  Rivaroxaban Xarelto 3 days 6 hrs  Ticagrelor Brilinta 5-7 days 6 hrs  Ticlopidine Ticlid 10-14 days 2 hrs  Tinzaparin Innohep 24 hours 4 hrs  Tirofiban Aggrastat 8 hours 2 hrs  Warfarin Coumadin 5 days 2 hrs   Other medications with blood-thinning effects  Product indications Generic (Brand) names Note  Cholesterol Lipitor Stop 4 days before procedure  Blood thinner (injectable) Heparin (LMW or LMWH Heparin) Stop 24 hours before procedure  Cancer Ibrutinib (Imbruvica) Stop 7 days before procedure  Malaria/Rheumatoid Hydroxychloroquine (Plaquenil) Stop 11 days before procedure  Thrombolytics  10 days before or after procedures   Over-the-counter (OTC) Products with blood-thinning effects  Product Common names Stop Time  Aspirin > 325 mg Goody Powders, Excedrin, etc. 11 days  Aspirin ? 81 mg  7 days  Fish oil  4 days  Garlic supplements  7 days  Ginkgo biloba  36  hours  Ginseng  24 hours  NSAIDs Ibuprofen, Naprosyn, etc. 3 days  Vitamin E  4 days   ____________________________________________________________________________________________  ______________________________________________________________________  Preparing for Procedure with Sedation  NOTICE: Due to recent regulatory changes, starting on July 13, 2021, procedures requiring intravenous (IV) sedation will no longer be performed at the [facility].  These types of procedures are required to be performed at [facility] ambulatory surgery facility.  We are very sorry for the inconvenience.  Procedure appointments are limited to planned procedures: No Prescription Refills. No disability issues will be discussed. No medication changes will be discussed.  Instructions: Oral Intake: Do not eat or drink anything for at least 8 hours prior to your procedure. (Exception: Blood Pressure Medication. See below.) Transportation: A driver is required. You may not drive yourself after the procedure. Blood Pressure Medicine: Do not forget to take your blood pressure medicine with a sip of water the morning of the procedure. If your Diastolic (lower reading) is above 100 mmHg, elective cases will be cancelled/rescheduled. Blood thinners: These will need to be stopped for procedures. Notify our staff if you are taking any blood thinners. Depending on which one you take, there will be specific instructions on how and when to stop it. Diabetics on insulin: Notify the staff so that you can be scheduled 1st case in the morning. If your diabetes requires high dose insulin, take only  of your normal insulin dose the morning of the procedure and notify the staff that you have done so. Preventing infections: Shower with an antibacterial soap the morning of your   procedure. Build-up your immune system: Take 1000 mg of Vitamin C with every meal (3 times a day) the day prior to your procedure. Antibiotics: Inform  the staff if you have a condition or reason that requires you to take antibiotics before dental procedures. Pregnancy: If you are pregnant, call and cancel the procedure. Sickness: If you have a cold, fever, or any active infections, call and cancel the procedure. Arrival: You must be in the facility at least 30 minutes prior to your scheduled procedure. Children: Do not bring children with you. Dress appropriately: There is always the possibility that your clothing may get soiled. Valuables: Do not bring any jewelry or valuables.  Reasons to call and reschedule or cancel your procedure: (Following these recommendations will minimize the risk of a serious complication.) Surgeries: Avoid having procedures within 2 weeks of any surgery. (Avoid for 2 weeks before or after any surgery). Flu Shots: Avoid having procedures within 2 weeks of a flu shots. (Avoid for 2 weeks before or after immunizations). Barium: Avoid having a procedure within 7-10 days after having had a radiological study involving the use of radiological contrast. (Myelograms, Barium swallow or enema study). Heart attacks: Avoid any elective procedures or surgeries for the initial 6 months after a "Myocardial Infarction" (Heart Attack). Blood thinners: It is imperative that you stop these medications before procedures. Let us know if you if you take any blood thinner.  Infection: Avoid procedures during or within two weeks of an infection (including chest colds or gastrointestinal problems). Symptoms associated with infections include: Localized redness, fever, chills, night sweats or profuse sweating, burning sensation when voiding, cough, congestion, stuffiness, runny nose, sore throat, diarrhea, nausea, vomiting, cold or Flu symptoms, recent or current infections. It is specially important if the infection is over the area that we intend to treat. Heart and lung problems: Symptoms that may suggest an active cardiopulmonary problem  include: cough, chest pain, breathing difficulties or shortness of breath, dizziness, ankle swelling, uncontrolled high or unusually low blood pressure, and/or palpitations. If you are experiencing any of these symptoms, cancel your procedure and contact your primary care physician for an evaluation.  Remember:  Regular Business hours are:  Monday to Thursday 8:00 AM to 4:00 PM  Provider's Schedule: Kynli Chou, MD:  Procedure days: Tuesday and Thursday 7:30 AM to 4:00 PM  Bilal Lateef, MD:  Procedure days: Monday and Wednesday 7:30 AM to 4:00 PM ______________________________________________________________________  ____________________________________________________________________________________________  General Risks and Possible Complications  Patient Responsibilities: It is important that you read this as it is part of your informed consent. It is our duty to inform you of the risks and possible complications associated with treatments offered to you. It is your responsibility as a patient to read this and to ask questions about anything that is not clear or that you believe was not covered in this document.  Patient's Rights: You have the right to refuse treatment. You also have the right to change your mind, even after initially having agreed to have the treatment done. However, under this last option, if you wait until the last second to change your mind, you may be charged for the materials used up to that point.  Introduction: Medicine is not an exact science. Everything in Medicine, including the lack of treatment(s), carries the potential for danger, harm, or loss (which is by definition: Risk). In Medicine, a complication is a secondary problem, condition, or disease that can aggravate an already existing one. All treatments carry the   risk of possible complications. The fact that a side effects or complications occurs, does not imply that the treatment was conducted  incorrectly. It must be clearly understood that these can happen even when everything is done following the highest safety standards.  No treatment: You can choose not to proceed with the proposed treatment alternative. The "PRO(s)" would include: avoiding the risk of complications associated with the therapy. The "CON(s)" would include: not getting any of the treatment benefits. These benefits fall under one of three categories: diagnostic; therapeutic; and/or palliative. Diagnostic benefits include: getting information which can ultimately lead to improvement of the disease or symptom(s). Therapeutic benefits are those associated with the successful treatment of the disease. Finally, palliative benefits are those related to the decrease of the primary symptoms, without necessarily curing the condition (example: decreasing the pain from a flare-up of a chronic condition, such as incurable terminal cancer).  General Risks and Complications: These are associated to most interventional treatments. They can occur alone, or in combination. They fall under one of the following six (6) categories: no benefit or worsening of symptoms; bleeding; infection; nerve damage; allergic reactions; and/or death. No benefits or worsening of symptoms: In Medicine there are no guarantees, only probabilities. No healthcare provider can ever guarantee that a medical treatment will work, they can only state the probability that it may. Furthermore, there is always the possibility that the condition may worsen, either directly, or indirectly, as a consequence of the treatment. Bleeding: This is more common if the patient is taking a blood thinner, either prescription or over the counter (example: Goody Powders, Fish oil, Aspirin, Garlic, etc.), or if suffering a condition associated with impaired coagulation (example: Hemophilia, cirrhosis of the liver, low platelet counts, etc.). However, even if you do not have one on these, it can  still happen. If you have any of these conditions, or take one of these drugs, make sure to notify your treating physician. Infection: This is more common in patients with a compromised immune system, either due to disease (example: diabetes, cancer, human immunodeficiency virus [HIV], etc.), or due to medications or treatments (example: therapies used to treat cancer and rheumatological diseases). However, even if you do not have one on these, it can still happen. If you have any of these conditions, or take one of these drugs, make sure to notify your treating physician. Nerve Damage: This is more common when the treatment is an invasive one, but it can also happen with the use of medications, such as those used in the treatment of cancer. The damage can occur to small secondary nerves, or to large primary ones, such as those in the spinal cord and brain. This damage may be temporary or permanent and it may lead to impairments that can range from temporary numbness to permanent paralysis and/or brain death. Allergic Reactions: Any time a substance or material comes in contact with our body, there is the possibility of an allergic reaction. These can range from a mild skin rash (contact dermatitis) to a severe systemic reaction (anaphylactic reaction), which can result in death. Death: In general, any medical intervention can result in death, most of the time due to an unforeseen complication. ____________________________________________________________________________________________  

## 2022-06-02 NOTE — Telephone Encounter (Signed)
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

## 2022-06-03 ENCOUNTER — Encounter: Payer: Self-pay | Admitting: *Deleted

## 2022-06-07 NOTE — Progress Notes (Signed)
PROVIDER NOTE: Interpretation of information contained herein should be left to medically-trained personnel. Specific patient instructions are provided elsewhere under "Patient Instructions" section of medical record. This document was created in part using STT-dictation technology, any transcriptional errors that may result from this process are unintentional.  Patient: Matthew [name] Type: Established DOB: 08-23-1936 MRN: [id number] PCP: Antionette Char, MD  Service: Procedure DOS: 06/10/2022 Setting: Ambulatory Location: Ambulatory outpatient facility Delivery: Face-to-face Provider: Gaspar Cola, MD Specialty: Interventional Pain Management Specialty designation: 09 Location: Outpatient facility Ref. Prov.: Milinda Pointer, MD    Primary Reason for Visit: Interventional Pain Management Treatment. CC: Hip Pain (right)  Procedure:               Type:  Intra-articular hip injection  #1  Laterality: Right (-RT)  Level: Lower pelvic and hip joint level.  Imaging: Fluoroscopy-guided Anesthesia: Local anesthesia (1-2% Lidocaine) Anxiolysis: None                 Sedation: None. DOS: 06/10/2022  Performed by: Gaspar Cola, MD  Purpose: Diagnostic/Therapeutic Indications: Hip pain severe enough to impact quality of life or function. Rationale (medical necessity): procedure needed and proper for the diagnosis and/or treatment of Matthew [name] medical symptoms and needs. 1. Chronic hip pain (Right)   2. Osteoarthritis of hip (Right)   3. Chronic anticoagulation (Plavix)    NAS-11 Pain score:   Pre-procedure: 4 /10   Post-procedure: 0-No pain/10      Target: Superior aspect of the hip joint cavity, going thru the superior portion of the capsular ligament.  Location: intra-articular  Region: Hip joint, upper (proximal) femoral region Approach: Percutaneous posterolateral approach. Type of procedure: Percutaneous joint injection   Position / Prep / Materials:  Position:  Lateral Decubitus with affected side up  Prep solution: DuraPrep (Iodine Povacrylex [0.7% available iodine] and Isopropyl Alcohol, 74% w/w) Prep Area:  Entire Posterolateral hip area. Materials:  Tray: Block tray Needle(s):  Type: Spinal  Gauge (G): 22  Length: 5-in  Qty: 1  Imaging Guidance (Non-Spinal):          Type of Imaging Technique: Fluoroscopy Guidance (Non-Spinal) Indication(s): Assistance in needle guidance and placement for procedures requiring needle placement in or near specific anatomical locations not easily accessible without such assistance. Exposure Time: Please see nurses notes. Contrast: Before injecting any contrast, we confirmed that the patient did not have an allergy to iodine, shellfish, or radiological contrast. Once satisfactory needle placement was completed at the desired level, radiological contrast was injected. Contrast injected under live fluoroscopy. No contrast complications. See chart for type and volume of contrast used. Fluoroscopic Guidance: I was personally present during the use of fluoroscopy. "Tunnel Vision Technique" used to obtain the best possible view of the target area. Parallax error corrected before commencing the procedure. "Direction-depth-direction" technique used to introduce the needle under continuous pulsed fluoroscopy. Once target was reached, antero-posterior, oblique, and lateral fluoroscopic projection used confirm needle placement in all planes. Images permanently stored in EMR. Interpretation: I personally interpreted the imaging intraoperatively. Adequate needle placement confirmed in multiple planes. Appropriate spread of contrast into desired area was observed. No evidence of afferent or efferent intravascular uptake. Permanent images saved into the patient's record.  Pre-Procedure Preparation:  Monitoring: As per clinic protocol. Respiration, ETCO2, SpO2, BP, heart rate and rhythm monitor placed and checked for adequate  function Safety Precautions: Patient was assessed for positional comfort and pressure points before starting the procedure. Time-out: I initiated and conducted the "  Time-out" before starting the procedure, as per protocol. The patient was asked to participate by confirming the accuracy of the "Time Out" information. Verification of the correct person, site, and procedure were performed and confirmed by me, the nursing staff, and the patient. "Time-out" conducted as per Joint Commission's Universal Protocol (UP.01.01.01). Time: 1318  Description/Narrative of Procedure:          Rationale (medical necessity): procedure needed and proper for the diagnosis and/or treatment of the patient's medical symptoms and needs. Procedural Technique Safety Precautions: Aspiration looking for blood return was conducted prior to all injections. At no point did we inject any substances, as a needle was being advanced. No attempts were made at seeking any paresthesias. Safe injection practices and needle disposal techniques used. Medications properly checked for expiration dates. SDV (single dose vial) medications used. Description of the Procedure: Protocol guidelines were followed. The patient was assisted into a comfortable position. The target area was identified and the area prepped in the usual manner. Skin & deeper tissues infiltrated with local anesthetic. Appropriate amount of time allowed to pass for local anesthetics to take effect. The procedure needles were then advanced to the target area. Proper needle placement secured. Negative aspiration confirmed. Solution injected in intermittent fashion, asking for systemic symptoms every 0.5cc of injectate. The needles were then removed and the area cleansed, making sure to leave some of the prepping solution back to take advantage of its long term bactericidal properties.  Technical description of procedure:  Skin & deeper tissues infiltrated with local anesthetic.  Appropriate amount of time allowed to pass for local anesthetics to take effect. The procedure needles were then advanced to the target area. Proper needle placement secured. Negative aspiration confirmed. Solution injected in intermittent fashion, asking for systemic symptoms every 0.5cc of injectate. The needles were then removed and the area cleansed, making sure to leave some of the prepping solution back to take advantage of its long term bactericidal properties.       Vitals:   06/10/22 1257 06/10/22 1314 06/10/22 1324 06/10/22 1327  BP: 116/61 (!) 125/53 119/61 120/62  Pulse: 64 74 74 72  Resp: '18 16 16 16  '$ Temp: 97.8 F (36.6 C)     SpO2: 96% 96% 97% 97%  Weight: 200 lb (90.7 kg)     Height: '5\' 9"'$  (1.753 m)        Start Time: 1318 hrs. End Time: 1325 hrs.  Post-operative Assessment:  Post-procedure Vital Signs:  Pulse/HCG Rate: 72 (NSR)  Temp: 97.8 F (36.6 C) Resp: 16 BP: 120/62 SpO2: 97 %  EBL: None  Complications: No immediate post-treatment complications observed by team, or reported by patient.  Note: The patient tolerated the entire procedure well. A repeat set of vitals were taken after the procedure and the patient was kept under observation following institutional policy, for this type of procedure. Post-procedural neurological assessment was performed, showing return to baseline, prior to discharge. The patient was provided with post-procedure discharge instructions, including a section on how to identify potential problems. Should any problems arise concerning this procedure, the patient was given instructions to immediately contact us, at any time, without hesitation. In any case, we plan to contact the patient by telephone for a follow-up status report regarding this interventional procedure.  Comments:  No additional relevant information.  Plan of Care  Orders:  Orders Placed This Encounter  Procedures   HIP INJECTION    Scheduling Instructions:      Side: Right-sided  Sedation: Patient's choice.     Timeframe: Today   DG PAIN [facility]IN[facility] C-ARM 1-60 MIN NO REPORT    Intraoperative interpretation by procedural physician at [place].    Standing Status:   Standing    Number of Occurrences:   1    Order Specific Question:   Reason for exam:    Answer:   Assistance in needle guidance and placement for procedures requiring needle placement in or near specific anatomical locations not easily accessible without such assistance.   Informed Consent Details: Physician/Practitioner Attestation; Transcribe to consent form and obtain patient signature    Nursing Order: Transcribe to consent form and obtain patient signature. Note: Always confirm laterality of pain with Mr. Peddie, before procedure.    Order Specific Question:   Physician/Practitioner attestation of informed consent for procedure/surgical case    Answer:   I, the physician/practitioner, attest that I have discussed with the patient the benefits, risks, side effects, alternatives, likelihood of achieving goals and potential problems during recovery for the procedure that I have provided informed consent.    Order Specific Question:   Procedure    Answer:   Hip injection    Order Specific Question:   Physician/Practitioner performing the procedure    Answer:   Cletus Mehlhoff A. Dossie Arbour, MD    Order Specific Question:   Indication/Reason    Answer:   Hip Joint Pain (Arthralgia)   Care order/instruction: Please confirm that the patient has stopped the Plavix (Clopidogrel) x 7-10 days prior to procedure or surgery.    Please confirm that the patient has stopped the Plavix (Clopidogrel) x 7-10 days prior to procedure or surgery.    Standing Status:   Standing    Number of Occurrences:   1   Provide equipment / supplies at bedside    "Block Tray" (Disposable  single use) Needle type: SpinalSpinal Amount/quantity: 1 Size: Medium (5-inch) Gauge: 22G    Standing Status:   Standing     Number of Occurrences:   1    Order Specific Question:   Specify    Answer:   Block Tray   Bleeding precautions    Standing Status:   Standing    Number of Occurrences:   1   Chronic Opioid Analgesic:  Tramadol 50 mg 1 tablet by mouth every 8 hours (150 mg/day of tramadol) (15 MME/day) MME/day: 15 mg/day.   Medications ordered for procedure: Meds ordered this encounter  Medications   iohexol (OMNIPAQUE) 180 MG/ML injection 10 mL    Must be Myelogram-compatible. If not available, you may substitute with a water-soluble, non-ionic, hypoallergenic, myelogram-compatible radiological contrast medium.   lidocaine (XYLOCAINE) 2 % (with pres) injection 400 mg   pentafluoroprop-tetrafluoroeth (GEBAUERS) aerosol   DISCONTD: lactated ringers infusion   DISCONTD: midazolam (VERSED) 5 MG/5ML injection 0.5-2 mg    Make sure Flumazenil is available in the pyxis when using this medication. If oversedation occurs, administer 0.2 mg IV over 15 sec. If after 45 sec no response, administer 0.2 mg again over 1 min; may repeat at 1 min intervals; not to exceed 4 doses (1 mg)   ropivacaine (PF) 2 mg/mL (0.2%) (NAROPIN) injection 9 mL   methylPREDNISolone acetate (DEPO-MEDROL) injection 80 mg   Medications administered: We administered iohexol, lidocaine, pentafluoroprop-tetrafluoroeth, ropivacaine (PF) 2 mg/mL (0.2%), and methylPREDNISolone acetate.  See the medical record for exact dosing, route, and time of administration.  Follow-up plan:   Return in about 2 weeks (around 06/24/2022) for Proc-day (T,Th), (F2F), (  PPE).       Interventional Therapies  Risk  Complexity Considerations:   Estimated body mass index is 29.53 kg/m as calculated from the following:   Height as of this encounter: '5\' 9"'$  (1.753 m).   Weight as of this encounter: 200 lb (90.7 kg). PLAVIX ANTICOAGULATION: (Stop: 7-10 days  Restart: 2 hours)    Planned  Pending:   Diagnostic right IA hip joint inj. #1    Under  consideration:   Therapeutic bilateral lumbar facet MBB #4 + bilateral SI joint Blk #4 (last done 07/24/2020)  Possible bilateral lumbar facet RFA #1 Possible bilateral SI joint RFA #1  Diagnostic left L2 TFESI  Diagnostic bilateral L3 TFESI  Diagnostic bilateral hip injections    Completed:   Diagnostic bilateral lumbar facet MBB x3 (07/24/2020) (100/100/75/75)  Diagnostic bilateral SI joint Blk x3 (07/24/2020) (100/100/75/75)  Therapeutic/palliative left L4-5 LESI x2 (03/09/2018) (100/100/85/75-100)    Therapeutic  Palliative (PRN) options:   Therapeutic lumbar facet MBB   Therapeutic SI joint Blk   Therapeutic/palliative left L4-5 LESI #3      Recent Visits Date Type Provider Dept  06/02/22 Office Visit Milinda Pointer, MD [facility]  Showing recent visits within past 90 days and meeting all other requirements Today's Visits Date Type Provider Dept  06/10/22 Procedure visit Milinda Pointer, MD [facility]  Showing today's visits and meeting all other requirements Future Appointments Date Type Provider Dept  06/29/22 Appointment Milinda Pointer, MD [facility]  Showing future appointments within next 90 days and meeting all other requirements  Disposition: Discharge home  Discharge (Date  Time): 06/10/2022; 1328 hrs.   Primary Care Physician: Antionette Char, MD Location: [facility] Note by: Gaspar Cola, MD Date: 06/10/2022; Time: 3:29 PM  Disclaimer:  Medicine is not an Chief Strategy Officer. The only guarantee in medicine is that nothing is guaranteed. It is important to note that the decision to proceed with this intervention was based on the information collected from the patient. The Data and conclusions were drawn from the patient's questionnaire, the interview, and the physical examination. Because the information was provided in large part by the patient, it cannot be guaranteed that it has not been  purposely or unconsciously manipulated. Every effort has been made to obtain as much relevant data as possible for this evaluation. It is important to note that the conclusions that lead to this procedure are derived in large part from the available data. Always take into account that the treatment will also be dependent on availability of resources and existing treatment guidelines, considered by other Pain Management Practitioners as being common knowledge and practice, at the time of the intervention. For Medico-Legal purposes, it is also important to point out that variation in procedural techniques and pharmacological choices are the acceptable norm. The indications, contraindications, technique, and results of the above procedure should only be interpreted and judged by a Board-Certified Interventional Pain Specialist with extensive familiarity and expertise in the same exact procedure and technique.

## 2022-06-10 ENCOUNTER — Ambulatory Visit: Payer: Medicare Other | Attending: Pain Medicine | Admitting: Pain Medicine

## 2022-06-10 ENCOUNTER — Encounter: Payer: Self-pay | Admitting: Pain Medicine

## 2022-06-10 ENCOUNTER — Ambulatory Visit
Admission: RE | Admit: 2022-06-10 | Discharge: 2022-06-10 | Disposition: A | Discharge status: Home / Self Care | Payer: Medicare Other | Source: Ambulatory Visit | Attending: Pain Medicine | Admitting: Pain Medicine

## 2022-06-10 VITALS — BP 120/62 | HR 72 | Temp 97.8°F | Resp 16 | Ht 69.0 in | Wt 200.0 lb

## 2022-06-10 DIAGNOSIS — Z7901 Long term (current) use of anticoagulants: Secondary | ICD-10-CM | POA: Insufficient documentation

## 2022-06-10 DIAGNOSIS — M25551 Pain in right hip: Secondary | ICD-10-CM | POA: Diagnosis present

## 2022-06-10 DIAGNOSIS — M1611 Unilateral primary osteoarthritis, right hip: Secondary | ICD-10-CM | POA: Insufficient documentation

## 2022-06-10 DIAGNOSIS — G8929 Other chronic pain: Secondary | ICD-10-CM | POA: Diagnosis present

## 2022-06-10 MED ORDER — LACTATED RINGERS IV SOLN: Freq: Once | INTRAVENOUS | Status: DC

## 2022-06-10 MED ORDER — LIDOCAINE HCL 2 % IJ SOLN
20.0000 mL | Freq: Once | INTRAMUSCULAR | Status: AC
  Administered 2022-06-10: 400 mg
  Filled 2022-06-10: qty 20

## 2022-06-10 MED ORDER — MIDAZOLAM HCL 5 MG/5ML IJ SOLN: 0.5000 mg | Freq: Once | INTRAMUSCULAR | Status: DC

## 2022-06-10 MED ORDER — METHYLPREDNISOLONE ACETATE 80 MG/ML IJ SUSP
80.0000 mg | Freq: Once | INTRAMUSCULAR | Status: AC
  Administered 2022-06-10: 80 mg via INTRA_ARTICULAR
  Filled 2022-06-10: qty 1

## 2022-06-10 MED ORDER — ROPIVACAINE HCL 2 MG/ML IJ SOLN
9.0000 mL | Freq: Once | INTRAMUSCULAR | Status: AC
  Administered 2022-06-10: 9 mL via INTRA_ARTICULAR
  Filled 2022-06-10: qty 20

## 2022-06-10 MED ORDER — PENTAFLUOROPROP-TETRAFLUOROETH EX AERO
INHALATION_SPRAY | Freq: Once | CUTANEOUS | Status: AC
  Administered 2022-06-10: 30 via TOPICAL
  Filled 2022-06-10: qty 116

## 2022-06-10 MED ORDER — IOHEXOL 180 MG/ML  SOLN
10.0000 mL | Freq: Once | INTRAMUSCULAR | Status: AC
  Administered 2022-06-10: 10 mL via INTRA_ARTICULAR

## 2022-06-10 NOTE — Progress Notes (Signed)
Safety precautions to be maintained throughout the outpatient stay will include: orient to surroundings, keep bed in low position, maintain call bell within reach at all times, provide assistance with transfer out of bed and ambulation.  

## 2022-06-10 NOTE — Patient Instructions (Signed)
____________________________________________________________________________________________  Virtual Visits   What is a "Virtual Visit"? It is a healthcare communication encounter (medical visit) that takes place on real time (NOT TEXT or E-MAIL) over the telephone or computer device (desktop, laptop, tablet, smart phone, etc.). It allows for more location flexibility between the patient and the healthcare provider.  Who decides when these types of visits will be used? The physician.  Who is eligible for these types of visits? Only those patients that can be reliably reached over the telephone.  What do you mean by reliably? We do not have time to call everyone multiple times, therefore those that tend to screen calls and then call back later are not suitable candidates for this system. We understand how people are reluctant to pickup on "unknown" calls, therefore, we suggest adding our telephone numbers to your list of "CONTACT(s)". This way, you should be able to readily identify our calls when you receive one. All of our numbers are available below.   Who is not eligible? This option is not available for medication management encounters, specially for controlled substances. Patients on pain medications that fall under the category of controlled substances have to come in for "Face-to-Face" encounters. This is required for mandatory monitoring of these substances. You may be asked to provide a sample for an unannounced urine drug screening test (UDS), and we will need to count your pain pills. Not bringing your pills to be counted may result in no refill. Obviously, neither one of these can be done over the phone.  When will this type of visits be used? You can request a virtual visit whenever you are physically unable to attend a regular appointment. The decision will be made by the physician (or healthcare provider) on a case by case basis.   At what time will I be called? This is an  excellent question. The providers will try to call you whenever they have time available. Do not expect to be called at any specific time. The secretaries will assign you a time for your virtual visit appointment, but this is done simply to keep a list of those patients that need to be called, but not for the purpose of keeping a time schedule. Be advised that the call may come in anytime during the day, between the hours of 8:00 AM and 8::00 PM, depending on provider availability. We do understand that the system is not perfect. If you are unable to be available that day on a moments notice, then request an "in-person" appointment rather than a "virtual visit".  Can I request my medication visits to be "Virtual"? Yes you may request it, but the decision is entirely up to the healthcare provider. Control substances require specific monitoring that requires Face-to-Face encounters. The number of encounters  and the extent of the monitoring is determined on a case by case basis.  Add a new contact to your smart phone and label it "PAIN CLINIC" Under this contact add the following numbers: Main: [phone] (Official Contact Number) Nurses: [phone] (These are outgoing only calling systems. Do not call this number.) Dr. Nirvana Blanchett: [phone] or [phone] (Outgoing calls only. Do not call this number.)  ____________________________________________________________________________________________   ____________________________________________________________________________________________  Post-Procedure Discharge Instructions  Instructions: Apply ice:  Purpose: This will minimize any swelling and discomfort after procedure.  When: Day of procedure, as soon as you get home. How: Fill a plastic sandwich bag with crushed ice. Cover it with a small towel and apply to   injection site. How long: (15 min on, 15 min off) Apply for 15 minutes then remove x 15 minutes.  Repeat sequence on day  of procedure, until you go to bed. Apply heat:  Purpose: To treat any soreness and discomfort from the procedure. When: Starting the next day after the procedure. How: Apply heat to procedure site starting the day following the procedure. How long: May continue to repeat daily, until discomfort goes away. Food intake: Start with clear liquids (like water) and advance to regular food, as tolerated.  Physical activities: Keep activities to a minimum for the first 8 hours after the procedure. After that, then as tolerated. Driving: If you have received any sedation, be responsible and do not drive. You are not allowed to drive for 24 hours after having sedation. Blood thinner: (Applies only to those taking blood thinners) You may restart your blood thinner 6 hours after your procedure. Insulin: (Applies only to Diabetic patients taking insulin) As soon as you can eat, you may resume your normal dosing schedule. Infection prevention: Keep procedure site clean and dry. Shower daily and clean area with soap and water. Post-procedure Pain Diary: Extremely important that this be done correctly and accurately. Recorded information will be used to determine the next step in treatment. For the purpose of accuracy, follow these rules: Evaluate only the area treated. Do not report or include pain from an untreated area. For the purpose of this evaluation, ignore all other areas of pain, except for the treated area. After your procedure, avoid taking a long nap and attempting to complete the pain diary after you wake up. Instead, set your alarm clock to go off every hour, on the hour, for the initial 8 hours after the procedure. Document the duration of the numbing medicine, and the relief you are getting from it. Do not go to sleep and attempt to complete it later. It will not be accurate. If you received sedation, it is likely that you were given a medication that may cause amnesia. Because of this, completing  the diary at a later time may cause the information to be inaccurate. This information is needed to plan your care. Follow-up appointment: Keep your post-procedure follow-up evaluation appointment after the procedure (usually 2 weeks for most procedures, 6 weeks for radiofrequencies). DO NOT FORGET to bring you pain diary with you.   Expect: (What should I expect to see with my procedure?) From numbing medicine (AKA: Local Anesthetics): Numbness or decrease in pain. You may also experience some weakness, which if present, could last for the duration of the local anesthetic. Onset: Full effect within 15 minutes of injected. Duration: It will depend on the type of local anesthetic used. On the average, 1 to 8 hours.  From steroids (Applies only if steroids were used): Decrease in swelling or inflammation. Once inflammation is improved, relief of the pain will follow. Onset of benefits: Depends on the amount of swelling present. The more swelling, the longer it will take for the benefits to be seen. In some cases, up to 10 days. Duration: Steroids will stay in the system x 2 weeks. Duration of benefits will depend on multiple posibilities including persistent irritating factors. Side-effects: If present, they may typically last 2 weeks (the duration of the steroids). Frequent: Cramps (if they occur, drink Gatorade and take over-the-counter Magnesium 450-500 mg once to twice a day); water retention with temporary weight gain; increases in blood sugar; decreased immune system response; increased appetite. Occasional: Facial flushing (red,   warm cheeks); mood swings; menstrual changes. Uncommon: Long-term decrease or suppression of natural hormones; bone thinning. (These are more common with higher doses or more frequent use. This is why we prefer that our patients avoid having any injection therapies in other practices.)  Very Rare: Severe mood changes; psychosis; aseptic necrosis. From procedure: Some  discomfort is to be expected once the numbing medicine wears off. This should be minimal if ice and heat are applied as instructed.  Call if: (When should I call?) You experience numbness and weakness that gets worse with time, as opposed to wearing off. New onset bowel or bladder incontinence. (Applies only to procedures done in the spine)  Emergency Numbers: Durning business hours (Monday - Thursday, 8:00 AM - 4:00 PM) (Friday, 9:00 AM - 12:00 Noon): [phone] After hours: [phone] NOTE: If you are having a problem and are unable connect with, or to talk to a provider, then go to your nearest urgent care or emergency department. If the problem is serious and urgent, please call 911. ____________________________________________________________________________________________   

## 2022-06-11 ENCOUNTER — Telehealth: Payer: Self-pay

## 2022-06-11 NOTE — Telephone Encounter (Signed)
Post procedure phone call.  Patients daughter states he is doing well.

## 2022-06-22 ENCOUNTER — Other Ambulatory Visit: Payer: Self-pay | Admitting: Internal Medicine

## 2022-06-22 ENCOUNTER — Other Ambulatory Visit: Payer: Self-pay | Admitting: Cardiovascular Disease

## 2022-06-23 NOTE — Telephone Encounter (Signed)
Please schedule office visit with Dr. Caryl Comes for refills. Thank you!

## 2022-06-27 NOTE — Progress Notes (Unsigned)
PROVIDER NOTE: Information contained herein reflects review and annotations entered in association with encounter. Interpretation of such information and data should be left to medically-trained personnel. Information provided to patient can be located elsewhere in the medical record under "Patient Instructions". Document created using STT-dictation technology, any transcriptional errors that may result from process are unintentional.    Patient: Matthew [name]  Service Category: E/M  Provider: Gaspar Cola, MD  DOB: [birth year]  DOS: 06/29/2022  Specialty: Interventional Pain Management  MRN: [id number]  Setting: Ambulatory outpatient  PCP: Antionette Char, MD  Type: Established Patient    Referring Provider: Antionette Char, MD  Location: Office  Delivery: Face-to-face     HPI  [name], a 86 y.o. year old male, is here today because of his No primary diagnosis found.. [name] primary complain today is No chief complaint on file. Last encounter: My last encounter with him was on 06/10/2022. Pertinent problems: [name] has Chronic low back pain (1ry area of Pain) (Bilateral) (L>R) w/ sciatica (Bilateral); Chronic lower extremity pain (2ry area of Pain) (Bilateral) (L>R); Chronic pain syndrome; Chronic sacroiliac joint pain (Bilateral) (L>R); Chronic hip pain (Right); Neurogenic pain; Lumbar facet syndrome (Bilateral) (L>R); DDD (degenerative disc disease), lumbar; Lumbar facet osteoarthritis; Lumbar spondylosis with radiculopathy; Chronic lower extremity radicular pain (S1 dermatome) (Bilateral) (L>R); Lumbar spinal stenosis; Abnormal MRI, lumbar spine (12/09/2017); Chronic lumbar radiculopathy; Spondylosis without myelopathy or radiculopathy, lumbosacral region; Other spondylosis, sacral and sacrococcygeal region; Chronic low back pain (Bilateral) w/o sciatica; and Osteoarthritis of hip (Right) on their pertinent problem list. Pain Assessment: Severity of   is reported as a  /10. Location:     / . Onset:  . Quality:  . Timing:  . Modifying factor(s):  Marland Kitchen Vitals:  vitals were not taken for this visit.   Reason for encounter: post-procedure evaluation and assessment. ***  Post-procedure evaluation   Type:  Intra-articular hip injection  #1  Laterality: Right (-RT)  Level: Lower pelvic and hip joint level.  Imaging: Fluoroscopy-guided Anesthesia: Local anesthesia (1-2% Lidocaine) Anxiolysis: None                 Sedation: None. DOS: 06/10/2022  Performed by: Gaspar Cola, MD  Purpose: Diagnostic/Therapeutic Indications: Hip pain severe enough to impact quality of life or function. Rationale (medical necessity): procedure needed and proper for the diagnosis and/or treatment of Mr. Tamblyn medical symptoms and needs. 1. Chronic hip pain (Right)   2. Osteoarthritis of hip (Right)   3. Chronic anticoagulation (Plavix)    NAS-11 Pain score:   Pre-procedure: 4 /10   Post-procedure: 0-No pain/10       Effectiveness:  Initial hour after procedure:   ***. Subsequent 4-6 hours post-procedure:   ***. Analgesia past initial 6 hours:   ***. Ongoing improvement:  Analgesic:  *** Function:    ***    ROM:    ***     Pharmacotherapy Assessment  Analgesic: Tramadol 50 mg 1 tablet by mouth every 8 hours (150 mg/day of tramadol) (15 MME/day) MME/day: 15 mg/day.   Monitoring: [place] PMP: PDMP reviewed during this encounter.       Pharmacotherapy: No side-effects or adverse reactions reported. Compliance: No problems identified. Effectiveness: Clinically acceptable.  No notes on file  UDS:  Summary  Date Value Ref Range Status  08/31/2017 FINAL  Final    Comment:    ==================================================================== [facility] COMP DRUG ANALYSIS,UR ==================================================================== Test  Result       Flag       Units Drug Present and Declared for Prescription Verification   Acetaminophen                   PRESENT      EXPECTED Drug Present not Declared for Prescription Verification   Tramadol                       >5102        UNEXPECTED ng/mg creat   O-Desmethyltramadol            5066         UNEXPECTED ng/mg creat   N-Desmethyltramadol            1382         UNEXPECTED ng/mg creat    Source of tramadol is a prescription medication.    O-desmethyltramadol and N-desmethyltramadol are expected    metabolites of tramadol. Drug Absent but Declared for Prescription Verification   Salicylate                     Not Detected UNEXPECTED    Aspirin, as indicated in the declared medication list, is not    always detected even when used as directed.   Metoprolol                     Not Detected UNEXPECTED ==================================================================== Test                      Result    Flag   Units      Ref Range   Creatinine              98               mg/dL      >=20 ==================================================================== Declared Medications:  The flagging and interpretation on this report are based on the  following declared medications.  Unexpected results may arise from  inaccuracies in the declared medications.  **Note: The testing scope of this panel includes these medications:  Metoprolol  **Note: The testing scope of this panel does not include small to  moderate amounts of these reported medications:  Acetaminophen  Aspirin  **Note: The testing scope of this panel does not include following  reported medications:  Atorvastatin  Clopidogrel  Hydrochlorothiazide (HCTZ)  Isosorbide Mononitrate  Lisinopril  Multivitamin  Nitroglycerin  Pantoprazole  Tamsulosin ==================================================================== For clinical consultation, please call [phone]. ====================================================================      ROS  Constitutional: Denies any fever or chills Gastrointestinal: No  reported hemesis, hematochezia, vomiting, or acute GI distress Musculoskeletal: Denies any acute onset joint swelling, redness, loss of ROM, or weakness Neurological: No reported episodes of acute onset apraxia, aphasia, dysarthria, agnosia, amnesia, paralysis, loss of coordination, or loss of consciousness  Medication Review  Multiple Vitamins-Minerals, Polypodium Leucotomos, Turmeric, acetaminophen, aspirin EC, atorvastatin, carvedilol, clopidogrel, ezetimibe, isosorbide mononitrate, mupirocin ointment, niacinamide, pantoprazole, sacubitril-valsartan, spironolactone, tamsulosin, tetrahydrozoline, and vitamin B-12  History Review  Allergy: Mr. Ferg is allergic to ranitidine and ranexa [ranolazine er]. Drug: Mr. Rindfleisch  reports no history of drug use. Alcohol:  reports no history of alcohol use. Tobacco:  reports that he quit smoking about 32 years ago. His smoking use included cigarettes. He has a 50.00 pack-year smoking history. He has never used smokeless tobacco. Social: Mr. Antolin  reports that he quit smoking about 32 years ago.  His smoking use included cigarettes. He has a 50.00 pack-year smoking history. He has never used smokeless tobacco. He reports that he does not drink alcohol and does not use drugs. Medical:  has a past medical history of Alcohol abuse, Anemia, AS (aortic stenosis), CAD (coronary artery disease), Carotid bruit, Chest pain, GERD (gastroesophageal reflux disease), History of kidney stones, HOH (hard of hearing), HTN (hypertension), Hyperlipidemia, Myocardial infarction ([place]) (1996), Nephrolithiasis, OSA (obstructive sleep apnea), PAD (peripheral artery disease) ([place]) (02/2014), Shortness of breath, Squamous cell carcinoma of skin (12/17/2020), Squamous cell carcinoma of skin (12/17/2020), Squamous cell carcinoma of skin (12/17/2020), and Thrombocytopenia ([place]). Surgical: Mr. Lasky  has a past surgical history that includes Stomach surgery; Coronary artery bypass graft (1991);  Inguinal hernia repair (11/10/2011); left heart catheterization with coronary/graft angiogram (N/A, 03/06/2014); abdominal aortagram (N/A, 03/06/2014); Cardiac catheterization (02/2014); Cardiac catheterization (N/A, 07/31/2015); Cardiac catheterization (N/A, 07/31/2015); Cardiac catheterization (N/A, 05/26/2016); Cysto; Transcatheter aortic valve replacement, transfemoral (N/A, 06/01/2016); TEE without cardioversion (N/A, 06/01/2016); LEFT HEART CATH AND CORONARY ANGIOGRAPHY (Left, 05/15/2018); Esophagogastroduodenoscopy (egd) with propofol (N/A, 10/07/2018); Colonoscopy with propofol (N/A, 10/07/2018); Givens capsule study (N/A, 12/22/2018); enteroscopy (N/A, 12/25/2018); Colonoscopy with propofol (N/A, 01/08/2019); Laparoscopic right colectomy (Right, 02/09/2019); BIV PACEMAKER INSERTION CRT-P (N/A, 07/23/2019); and RIGHT HEART CATH AND CORONARY/GRAFT ANGIOGRAPHY (N/A, 09/05/2020). Family: family history includes Heart disease in his father and mother.  Laboratory Chemistry Profile   Renal Lab Results  Component Value Date   BUN 17 03/25/2022   CREATININE 1.13 03/25/2022   BCR 9 (L) 01/07/2022   GFRAA >60 09/03/2020   GFRNONAA >60 03/25/2022    Hepatic Lab Results  Component Value Date   AST 25 01/07/2022   ALT 45 (H) 01/07/2022   ALBUMIN 4.5 01/07/2022   ALKPHOS 94 01/07/2022    Electrolytes Lab Results  Component Value Date   NA 136 03/25/2022   K 3.9 03/25/2022   CL 106 03/25/2022   CALCIUM 9.4 03/25/2022   MG 2.0 02/10/2019   PHOS 1.7 (L) 02/10/2019    Bone Lab Results  Component Value Date   25OHVITD1 31 08/31/2017   25OHVITD2 <1.0 08/31/2017   25OHVITD3 31 08/31/2017    Inflammation (CRP: Acute Phase) (ESR: Chronic Phase) Lab Results  Component Value Date   CRP 0.4 08/31/2017   ESRSEDRATE 3 08/31/2017         Note: Above Lab results reviewed.  Recent Imaging Review  DG PAIN CLINIC C-ARM 1-60 MIN NO REPORT Fluoro was used, but no Radiologist interpretation will be provided.   Please refer to "NOTES" tab for provider progress note. Note: Reviewed        Physical Exam  General appearance: Well nourished, well developed, and well hydrated. In no apparent acute distress Mental status: Alert, oriented x 3 (person, place, & time)       Respiratory: No evidence of acute respiratory distress Eyes: PERLA Vitals: There were no vitals taken for this visit. BMI: Estimated body mass index is 29.53 kg/m as calculated from the following:   Height as of 06/10/22: 5' 9"  (1.753 m).   Weight as of 06/10/22: 200 lb (90.7 kg). Ideal: Patient weight not recorded  Assessment   Diagnosis Status  No diagnosis found. Controlled Controlled Controlled   Updated Problems: No problems updated.  Plan of Care  Problem-specific:  No problem-specific Assessment & Plan notes found for this encounter.  Mr. ROMULO OKRAY has a current medication list which includes the following long-term medication(s): atorvastatin, carvedilol, ezetimibe, isosorbide mononitrate, pantoprazole, and  spironolactone.  Pharmacotherapy (Medications Ordered): No orders of the defined types were placed in this encounter.  Orders:  No orders of the defined types were placed in this encounter.  Follow-up plan:   No follow-ups on file.     Interventional Therapies  Risk  Complexity Considerations:   Estimated body mass index is 29.53 kg/m as calculated from the following:   Height as of this encounter: 5' 9"  (1.753 m).   Weight as of this encounter: 200 lb (90.7 kg). PLAVIX ANTICOAGULATION: (Stop: 7-10 days  Restart: 2 hours)    Planned  Pending:   Diagnostic right IA hip joint inj. #1    Under consideration:   Therapeutic bilateral lumbar facet MBB #4 + bilateral SI joint Blk #4 (last done 07/24/2020)  Possible bilateral lumbar facet RFA #1 Possible bilateral SI joint RFA #1  Diagnostic left L2 TFESI  Diagnostic bilateral L3 TFESI  Diagnostic bilateral hip injections    Completed:    Diagnostic bilateral lumbar facet MBB x3 (07/24/2020) (100/100/75/75)  Diagnostic bilateral SI joint Blk x3 (07/24/2020) (100/100/75/75)  Therapeutic/palliative left L4-5 LESI x2 (03/09/2018) (100/100/85/75-100)    Therapeutic  Palliative (PRN) options:   Therapeutic lumbar facet MBB   Therapeutic SI joint Blk   Therapeutic/palliative left L4-5 LESI #3       Recent Visits Date Type Provider Dept  06/10/22 Procedure visit Milinda Pointer, MD [facility]  06/02/22 Office Visit Milinda Pointer, MD [facility]  Showing recent visits within past 90 days and meeting all other requirements Future Appointments Date Type Provider Dept  06/29/22 Appointment Milinda Pointer, MD [facility]  Showing future appointments within next 90 days and meeting all other requirements  I discussed the assessment and treatment plan with the patient. The patient was provided an opportunity to ask questions and all were answered. The patient agreed with the plan and demonstrated an understanding of the instructions.  Patient advised to call back or seek an in-person evaluation if the symptoms or condition worsens.  Duration of encounter: *** minutes.  Total time on encounter, as per AMA guidelines included both the face-to-face and non-face-to-face time personally spent by the physician and/or other qualified health care professional(s) on the day of the encounter (includes time in activities that require the physician or other qualified health care professional and does not include time in activities normally performed by clinical staff). Physician's time may include the following activities when performed: preparing to see the patient (eg, review of tests, pre-charting review of records) obtaining and/or reviewing separately obtained history performing a medically appropriate examination and/or evaluation counseling and educating the patient/family/caregiver ordering  medications, tests, or procedures referring and communicating with other health care professionals (when not separately reported) documenting clinical information in the electronic or other health record independently interpreting results (not separately reported) and communicating results to the patient/ family/caregiver care coordination (not separately reported)  Note by: Gaspar Cola, MD Date: 06/29/2022; Time: 3:06 PM

## 2022-06-28 NOTE — Progress Notes (Signed)
Cardiology Office Note    Date:  06/29/2022   ID:  Matthew [name], DOB 1936-07-17, MRN [id number]  PCP:  Matthew Char, MD  Cardiologist:  [name], MD  Electrophysiologist:  Virl Axe, MD   Chief Complaint: Follow-up  History of Present Illness:   Matthew [name] is a 86 y.o. male with history of CAD status post CABG in 1991 with redo CABG in 2004, aortic stenosis status post TAVR in 05/2016 with postprocedure LBBB, HFrEF secondary to ICM status post CRT-P in 07/2019, PAD status post prior intervention, HTN, HLD, GI bleeding in 09/2018 with gastric AVMs noted status post therapy with acute blood loss anemia, and PVCs who presents for follow-up of his CAD, aortic stenosis, and cardiomyopathy.  R/LHC in 05/2016, prior to his TAVR, showed severe underlying 3-vessel CAD with patent grafts (proximal LAD 100% stenosed, mid LCx 100% stenosed, OM2 80% stenosed, proximal RCA 100% stenosed, LIMA-LAD was unable to be cannulated and was anatomically normal and normal in caliber, RIMA-D2 was visualized by non-selective angiography and was anatomically normal, VG-OM2 with a patent, previously placed proximal graft stent, 50% mid VG-OM2 stenosis, VG-OM3 minimal luminal irregularities, VG-RPDA mild diffuse disease), RHC showed high normal pulmonary pressure and filling pressure, normal cardiac output. Following this, he underwent TAVR in 05/2016. Post procedure, he developed a LBBB coupled with a gradual decline in his EF. Pre-TAVR, his EF was 55-60% with follow up study shortly after his procedure showing an EF of 55%. In 2018, his EF was noted to be 45-50% then dropped to 30-35% in 2019. Cardiac cath in 2019, performed for worsening angina, showed significant underlying three-vessel CAD with patent LIMA to LAD, patent RIMA to diagonal, patent SVG to OM3 and patent SVG to rPDA. The SVG to OM2 was found to be occluded. However, OM2 had retrograde flow from SVG to OM3. There was possible left subclavian artery  stenosis but it was not significant by gradient. This was also interrogated by carotid Doppler and there was nothing to suggest significant left subclavian stenosis.  He was admitted to the hospital in 09/2018 with GI bleed and acute blood loss anemia.  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  Anemia improved with IV iron.  Echo in 02/2019, showed an EF of 25-30%, mild aortic stenosis with a mean gradient of 17 mmHg and mild to moderate aortic insufficiency. With continued decline in his LVSF, with an underlying LBBB, he underwent CRT-P implantation in 07/2019.  Echo 08/2020 showed an improved LVSF with an EF of 40-45%, mildly dilated LV cavity size, moderate LVH, Gr1DD, elevated left atrial pressure, moderately reduced RVSF with normal RV cavity size, trivial mitral regurgitation, normal size and function of AVR.  Due to worsening angina, he underwent diagnostic cath in 08/2020 that showed significant underlying 3 vessel CAD with patent grafts including LIMA to LAD, RIMA to diagonal, SVG RCA, and SVG to OM3.  There was no significant change in his coronary anatomy when compared to prior cardiac cath.  He was noted to have native small vessel CAD which was felt to possibly be the culprit for his stable anginal symptoms.  RHC showed normal filling pressures, normal pulmonary pressure, and normal cardiac output.  Most recent echo from 12/2021 demonstrated an EF of 40 to 45% with normal functioning TAVR prosthesis and a mean gradient of 10 mmHg.  He was last seen in the office in 03/2022 noting issues with hypotension that required gradual decrease in his Entresto.  Even with decreasing this, he continued to have intermittent episodes of hypotension with systolic blood pressure less than 100 mmHg.  He was without symptoms of angina or decompensation.  CBC showed no evidence of anemia with renal function noted to be stable.  Entresto was further decreased to 24/26 mg twice daily.  He was started on  spironolactone 12.5 mg, and continued on carvedilol.  Since we last saw him, he has been evaluated by pain management for significant back/hip pain.  He comes in accompanied by his daughter and grandson today.  He is doing well from a cardiac perspective, without symptoms of angina or decompensation.  His functional status has significantly improved following treatment for his back/hip pain.  He does continue to note some shortness of breath and intermittent hypotension with dizziness, particularly if he "overdoes it."  For example, this morning he went out to feed the chickens, had to go back and get his rifle to shoot a trapped possum, then went out to dig in the garden and pull weeds.  No symptoms of frank angina or syncope.  Symptoms are overall stable.  His weight is down 7 pounds today when compared to his last clinic visit.   Labs independently reviewed: 03/2022 - potassium 3.9, BUN 17, serum creatinine 1.13, Hgb 14.7, PLT 114 12/2021 - albumin 4.5, AST normal, ALT 45, TC 144, TG 120, HDL 56, LDL 67 09/2018 - TSH normal  Past Medical History:  Diagnosis Date   Alcohol abuse    stopped drinking 42 years ago from today 2020   Anemia    AS (aortic stenosis)    a. Echo 6/10: EF 55% mild AS; b. echo 06/2015; EF 55-60%, GR1DD, moderate AS, Peak velocity (S): 346 cm/s. Mean gradientS): 28 mm Hg. Peak gradient (S): 48 mm Hg. Valve area (VTI): 1.18 cm2;   c. Echo 4/17 - mild LVH, EF 55-60%, no RWMA, Gr 1 DD, mod to severe AS (mean 28 mmHg, peak 46 mmHg), mild LAE   CAD (coronary artery disease)    a. MI 1996 w/ CABG 1996; b.redo CABG 06/2003; c. Myoview 7/09: EF 54% inferobasal infarct, no ischemia. Myoview 6/10 EF 43% inf wall infarct. no ischemia; c. cath 8/16 s/p DES to VG-OM2. OTW 3VD w/ patent VG->RPDA, VG->OM3,  & LIMA->LAD.  EF 55-65%.   Carotid bruit    2009 0-39% on dopplers bilatrally   Chest pain    GERD (gastroesophageal reflux disease)    History of kidney stones    HOH (hard of  hearing)    HTN (hypertension)    Hyperlipidemia    Myocardial infarction ([place]) 1996   Nephrolithiasis    OSA (obstructive sleep apnea)    PAD (peripheral artery disease) ([place]) 02/2014   Subtotal occlusion of right common iliac artery and 70% stenosis in the left common iliac artery. Status post bilateral kissing stent placement. Significant post stenosis aneurysmal dilatation on the right side (any future catheterization through the right femoral artery should be done cautiously to avoid advancing the wire behind the stent struts)   Shortness of breath    with exertion   Squamous cell carcinoma of skin 12/17/2020   R angle of mandible - SCCIS, ED&C 01/22/2021   Squamous cell carcinoma of skin 12/17/2020   R upper arm SCCIS arising in [place], ED&C 01/22/2021   Squamous cell carcinoma of skin 12/17/2020   R chest - SCCIS, ED&C 01/22/2021   Thrombocytopenia ([employer])     Past Surgical History:  Procedure Laterality  Date   ABDOMINAL AORTAGRAM N/A 03/06/2014   Procedure: ABDOMINAL Maxcine Ham;  Surgeon: Wellington Hampshire, MD;  Location: [facility] CATH LAB;  Service: Cardiovascular;  Laterality: N/A;   BIV PACEMAKER INSERTION CRT-P N/A 07/23/2019   Procedure: BIV PACEMAKER INSERTION CRT-P;  Surgeon: Deboraha Sprang, MD;  Location: [place] CV LAB;  Service: Cardiovascular;  Laterality: N/A;   CARDIAC CATHETERIZATION  02/2014   Severe three-vessel coronary artery disease with patent grafts.    CARDIAC CATHETERIZATION N/A 07/31/2015   Procedure: Right/Left Heart Cath and Coronary/Graft Angiography;  Surgeon: Wellington Hampshire, MD;  Location: [place] CV LAB;  Service: Cardiovascular;  Laterality: N/A;   CARDIAC CATHETERIZATION N/A 07/31/2015   Procedure: Coronary Stent Intervention;  Surgeon: Wellington Hampshire, MD;  Location: [place] CV LAB;  Service: Cardiovascular;  Laterality: N/A;   CARDIAC CATHETERIZATION N/A 05/26/2016   Procedure: Right/Left Heart Cath and Coronary Angiography;  Surgeon: Wellington Hampshire, MD;  Location: [place] CV LAB;  Service: Cardiovascular;  Laterality: N/A;   COLONOSCOPY WITH PROPOFOL N/A 10/07/2018   Procedure: COLONOSCOPY WITH PROPOFOL;  Surgeon: Lin Landsman, MD;  Location: [facility] ENDOSCOPY;  Service: Gastroenterology;  Laterality: N/A;   COLONOSCOPY WITH PROPOFOL N/A 01/08/2019   Procedure: COLONOSCOPY WITH PROPOFOL;  Surgeon: Lin Landsman, MD;  Location: [facility] ENDOSCOPY;  Service: Gastroenterology;  Laterality: N/A;   CORONARY ARTERY BYPASS GRAFT  1991   at [facility] Redone 2004-3 vessels 1st time and 4 second time   CYSTO     2/3 times for kidney stones   ENTEROSCOPY N/A 12/25/2018   Procedure: ENTEROSCOPY Single balloon;  Surgeon: Lin Landsman, MD;  Location: [place];  Service: Gastroenterology;  Laterality: N/A;   ESOPHAGOGASTRODUODENOSCOPY (EGD) WITH PROPOFOL N/A 10/07/2018   Procedure: ESOPHAGOGASTRODUODENOSCOPY (EGD) WITH PROPOFOL;  Surgeon: Lin Landsman, MD;  Location: [facility] ENDOSCOPY;  Service: Gastroenterology;  Laterality: N/A;   GIVENS CAPSULE STUDY N/A 12/22/2018   Procedure: GIVENS CAPSULE STUDY;  Surgeon: Lin Landsman, MD;  Location: [facility] ENDOSCOPY;  Service: Gastroenterology;  Laterality: N/A;   INGUINAL HERNIA REPAIR  11/10/2011   Procedure: HERNIA REPAIR INGUINAL ADULT;  Surgeon: Donato Heinz;  Location: [facility] ORS;  Service: General;  Laterality: Right;   LAPAROSCOPIC RIGHT COLECTOMY Right 02/09/2019   Procedure: LAPAROSCOPIC RIGHT COLECTOMY;  Surgeon: Jules Husbands, MD;  Location: [facility] ORS;  Service: General;  Laterality: Right;   LEFT HEART CATH AND CORONARY ANGIOGRAPHY Left 05/15/2018   Procedure: LEFT HEART CATH AND CORONARY ANGIOGRAPHY;  Surgeon: Wellington Hampshire, MD;  Location: [place] CV LAB;  Service: Cardiovascular;  Laterality: Left;   LEFT HEART CATHETERIZATION WITH CORONARY/GRAFT ANGIOGRAM N/A 03/06/2014   Procedure: LEFT HEART CATHETERIZATION WITH Beatrix Fetters;  Surgeon: Wellington Hampshire, MD;  Location: [place] CATH LAB;  Service: Cardiovascular;  Laterality: N/A;   RIGHT HEART CATH AND CORONARY/GRAFT ANGIOGRAPHY N/A 09/05/2020   Procedure: RIGHT HEART CATH AND CORONARY/GRAFT ANGIOGRAPHY;  Surgeon: Wellington Hampshire, MD;  Location: [place] CV LAB;  Service: Cardiovascular;  Laterality: N/A;   STOMACH SURGERY     removal of gastric ulcers   TEE WITHOUT CARDIOVERSION N/A 06/01/2016   Procedure: TRANSESOPHAGEAL ECHOCARDIOGRAM (TEE);  Surgeon: Sherren Mocha, MD;  Location: [place];  Service: Open Heart Surgery;  Laterality: N/A;   TRANSCATHETER AORTIC VALVE REPLACEMENT, TRANSFEMORAL N/A 06/01/2016   Procedure: TRANSCATHETER AORTIC VALVE REPLACEMENT, TRANSFEMORAL;  Surgeon: Sherren Mocha, MD;  Location: [place];  Service: Open Heart Surgery;  Laterality: N/A;  Current Medications: Current Meds  Medication Sig   acetaminophen (TYLENOL) 325 MG tablet Take 650 mg by mouth every 6 (six) hours as needed for mild pain or headache.    aspirin EC 81 MG tablet Take 1 tablet (81 mg total) by mouth daily.   atorvastatin (LIPITOR) 80 MG tablet TAKE 1 TABLET BY MOUTH DAILY AT  6 PM   carvedilol (COREG) 3.125 MG tablet TAKE 1 TABLET BY MOUTH  TWICE DAILY   clopidogrel (PLAVIX) 75 MG tablet clopidogrel 75 mg tablet   ezetimibe (ZETIA) 10 MG tablet TAKE 1 TABLET BY MOUTH  DAILY   isosorbide mononitrate (IMDUR) 60 MG 24 hr tablet TAKE 1 TABLET BY MOUTH DAILY   Multiple Vitamins-Minerals (PRESERVISION AREDS PO) Take 1 tablet by mouth daily.    mupirocin ointment (BACTROBAN) 2 % Apply 1 application topically daily.   niacinamide 500 MG tablet Take 500 mg by mouth 2 (two) times daily with a meal.   pantoprazole (PROTONIX) 40 MG tablet TAKE 1 TABLET BY MOUTH DAILY   Polypodium Leucotomos (HELIOCARE PO) Take by mouth.   sacubitril-valsartan (ENTRESTO) 24-26 MG Take 1 tablet by mouth 2 (two) times daily.   spironolactone (ALDACTONE) 25 MG tablet Take 0.5 tablets (12.5 mg total) by mouth daily.    tamsulosin (FLOMAX) 0.4 MG CAPS capsule Take 1 capsule (0.4 mg total) by mouth daily. Future refill request will need to be sent to the pcp   tetrahydrozoline 0.05 % ophthalmic solution Place 2 drops into both eyes 4 (four) times daily as needed (for dry eyes).   TURMERIC PO Take 1 capsule by mouth at bedtime.    vitamin B-12 (CYANOCOBALAMIN) 1000 MCG tablet Take 1,000 mcg by mouth daily.   [DISCONTINUED] ENTRESTO 97-103 MG TAKE ONE-HALF TABLET BY  MOUTH TWICE DAILY    Allergies:   Ranitidine and Ranexa [ranolazine er]   Social History   Socioeconomic History   Marital status: Married    Spouse name: Not on file   Number of children: 2   Years of education: Not on file   Highest education level: Not on file  Occupational History   Occupation: Retired  Tobacco Use   Smoking status: Former    Packs/day: 1.00    Years: 50.00    Total pack years: 50.00    Types: Cigarettes    Quit date: 01/24/1990    Years since quitting: 32.4   Smokeless tobacco: Never  Vaping Use   Vaping Use: Never used  Substance and Sexual Activity   Alcohol use: No    Alcohol/week: 0.0 standard drinks of alcohol    Comment: Alcoholic in past   Drug use: No   Sexual activity: Never  Other Topics Concern   Not on file  Social History Narrative   Married an lives with wife in [place]. Retired form [employer].    Social Determinants of Health   Financial Resource Strain: Not on file  Food Insecurity: Not on file  Transportation Needs: Not on file  Physical Activity: Not on file  Stress: Not on file  Social Connections: Not on file     Family History:  The patient's family history includes Heart disease in his father and mother. There is no history of Colon cancer.  ROS:   12-point review of systems is negative unless otherwise noted in the HPI.   EKGs/Labs/Other Studies Reviewed:    Studies reviewed were summarized above. The additional studies were reviewed today:  2D echo  01/07/2022: 1. Left  ventricular ejection fraction, by estimation, is 40 to 45%. The  left ventricle has mild to moderately decreased function. The left  ventricle demonstrates global hypokinesis. Left ventricular diastolic  parameters are consistent with Grade I  diastolic dysfunction (impaired relaxation).   2. Right ventricular systolic function is normal. The right ventricular  size is normal.   3. Left atrial size was mildly dilated.   4. Right atrial size was mildly dilated.   5. The mitral valve is degenerative. No evidence of mitral valve  regurgitation.   6. The aortic valve has been repaired/replaced. Aortic valve  regurgitation is not visualized. There is a 26 mm Sapien prosthetic (TAVR)  valve present in the aortic position. Procedure Date: 2017. Echo findings  are consistent with normal structure and  function of the aortic valve prosthesis. Aortic valve mean gradient  measures 10.0 mmHg. __________  [facility] 09/05/2020: Prox LAD lesion is 100% stenosed. 2nd Mrg lesion is 80% stenosed. Ost Cx lesion is 30% stenosed. Ost Cx to Prox Cx lesion is 100% stenosed. Mid Cx lesion is 60% stenosed. Prox RCA lesion is 100% stenosed. And is normal in caliber. The graft exhibits minimal luminal irregularities. And is normal in caliber. The graft exhibits severe focal disease. Origin to Prox Graft lesion is 100% stenosed. LIMA due to inability to cannulate and is normal in caliber. The graft exhibits no disease. SVG. Origin lesion before Acute Mrg is 20% stenosed. Ost RPDA lesion is 70% stenosed.   1.  Significant underlying three-vessel coronary artery disease with patent grafts including LIMA to LAD, RIMA to diagonal, SVG to RCA and SVG to OM 3.  No significant change in coronary anatomy since most recent cardiac catheterization.  The patient does have native small vessel coronary artery disease which might be the culprit for his stable anginal symptoms. 2.  Right heart  catheterization showed normal filling pressures, normal pulmonary pressure and normal cardiac output.   Recommendations: Continue medical therapy. __________   2D echo 09/02/2020: 1. Left ventricular ejection fraction, by estimation, is 40 to 45%. The  left ventricle has mildly decreased function. Left ventricular endocardial  border not optimally defined to evaluate regional wall motion. The left  ventricular internal cavity size  was mildly dilated. There is moderate left ventricular hypertrophy. Left  ventricular diastolic parameters are consistent with Grade I diastolic  dysfunction (impaired relaxation). Elevated left atrial pressure.   2. Right ventricular systolic function is moderately reduced. The right  ventricular size is normal. Tricuspid regurgitation signal is inadequate  for assessing PA pressure.   3. The mitral valve is normal in structure. Trivial mitral valve  regurgitation. No evidence of mitral stenosis.   4. The aortic valve has been repaired/replaced. Aortic valve  regurgitation is not visualized. Echo findings are consistent with normal  structure and function of the aortic valve prosthesis. Aortic valve mean  gradient measures 14.0 mmHg.  __________   2D echo 02/2019:  1. The left ventricle has severely reduced systolic function, with an  ejection fraction of 25-30%. The cavity size was moderately dilated. Left  ventricular diffuse hypokinesis. Unable to exclude regional wall motion  abnormality (challenging image  quality)   2. The right ventricle has normal systolic function. The cavity was  normal. There is no increase in right ventricular wall thickness. Right  ventricular systolic pressure is mildly elevated with an estimated  pressure of 38.0 mmHg.   3. Left atrial size was severely dilated.   4. Aortic valve regurgitation is mild  to moderate. Mild stenosis of the  aortic valve. Mean gradient 17 mm Hg.  __________   2D echo 09/2018: - Left  ventricle: The cavity size was mildly dilated. Wall    thickness was normal. Systolic function was moderately to    severely reduced. The estimated ejection fraction was in the    range of 30% to 35%. Wall motion was normal; there were no    regional wall motion abnormalities. Features are consistent with    a pseudonormal left ventricular filling pattern, with concomitant    abnormal relaxation and increased filling pressure (grade 2    diastolic dysfunction).  - Aortic valve: A bioprosthesis was present and functioning    normally. s/p TAVR. Mean gradient (S): 13 mm Hg.  - Mitral valve: Calcified annulus. There was mild regurgitation.  - Left atrium: The atrium was moderately dilated.  - Pulmonary arteries: Systolic pressure could not be accurately    estimated.  __________   LHC 05/2018: Prox LAD lesion is 100% stenosed. 2nd Mrg lesion is 80% stenosed. Prox RCA lesion is 100% stenosed. And is normal in caliber. The graft exhibits minimal luminal irregularities. And is normal in caliber. The graft exhibits severe focal disease. LIMA due to inability to cannulate and is normal in caliber. The graft exhibits no disease. Origin lesion is 40% stenosed. Ost Cx to Prox Cx lesion is 100% stenosed. Ost Cx lesion is 30% stenosed. Mid Cx lesion is 60% stenosed. Origin to Prox Graft lesion is 100% stenosed.   1.  Significant underlying three-vessel coronary artery disease with patent LIMA to LAD, patent RIMA to diagonal, patent SVG to OM 3 and patent SVG to right PDA.  The SVG to OM 2 is now occluded which is a new finding.  However, OM2 gets retrograde flow from SVG to OM 3. 2.  Possible significant left subclavian artery stenosis although no gradient was noted with pullback.   Recommendations: Overall difficult procedure due to significant tortuosity of the innominate artery and left subclavian artery.  No coronary revascularization is needed.  We have to investigate the significance of  left subclavian artery stenosis which might be causing decreased flow into the LIMA to LAD.  Recommend carotid Doppler with focus on the left subclavian artery.  Endovascular intervention of the left subclavian artery is not straightforward due to tortuosity and also close origin to the LIMA. The patient is noted to have frequent PVCs before and throughout the cath.  This might be contributing to his cardiomyopathy.  Recommend a 24-hour Holter monitor. __________   24-hour Holter 05/2018: 5600 PVCs in 24 hours representing 6% burden. Occasional PACs. Average heart rate of 62 bpm. __________   2D echo 04/2018: - Left ventricle: The cavity size was normal. Systolic function was    moderately to severely reduced. The estimated ejection fraction    was in the range of 30% to 35%. Diffuse hypokinesis. Regional    wall motion abnormalities cannot be excluded. Features are    consistent with a pseudonormal left ventricular filling pattern,    with concomitant abnormal relaxation and increased filling    pressure (grade 2 diastolic dysfunction).  - Aortic valve: A bioprosthesis was present. s/p TAVR Peak velocity    (S): 250 cm/s. Mean gradient (S): 14 mm Hg.  - Aortic root: The aortic root was mildly dilated.  - Mitral valve: There was mild regurgitation.  - Left atrium: The atrium was moderately dilated.  - Right ventricle: Systolic function was normal.  -  Pulmonary arteries: Systolic pressure was within the normal    range.  __________   2D echo 05/2017: - Left ventricle: The cavity size was mildly dilated. There was    mild concentric hypertrophy. Systolic function was mildly    reduced. The estimated ejection fraction was in the range of 45%    to 50%. Doppler parameters are consistent with abnormal left    ventricular relaxation (grade 1 diastolic dysfunction).  - Ventricular septum: Septal motion showed abnormal function and    dyssynergy.  - Aortic valve: A TAVR prosthesis was  present and functioning    normally. Mean gradient (S): 11 mm Hg. Peak gradient (S): 20 mm    Hg. Valve area (VTI): 1.79 cm^2.  - Left atrium: The atrium was moderately to severely dilated.  - Right atrium: The atrium was mildly dilated.   Impressions:   - Comapred to most recent echo, the LV is mildly more dilated with    mildly reduced EF.  __________   2D echo 06/2016: - Left ventricle: LVEF is approximately 55% with mid/distal    inferior hypokiensis; septal hypokdinesis; basal inferior    akinesis with aneurysmal dilitation. The cavity size was mildly    dilated. Wall thickness was normal. Doppler parameters are    consistent with abnormal left ventricular relaxation (grade 1    diastolic dysfunction).  - Aortic valve: AV prosthesis is well seated Peak and mean    gradients through the valve are 19 and 11 mm Hg respectively.  - Mitral valve: Calcified annulus. Mildly thickened leaflets .  - Left atrium: The atrium was severely dilated.  __________   2D echo 05/2016: - Left ventricle: Posterior basal akinesis The cavity size was    mildly dilated. Systolic function was normal. The estimated    ejection fraction was in the range of 50% to 55%. Doppler    parameters are consistent with both elevated ventricular    end-diastolic filling pressure and elevated left atrial filling    pressure.  - Aortic valve: Normal appearing 26 mm Sapien 3 valve with no    significant perivalvular regurgitation post TAVR. Valve area    (VTI): 0.81 cm^2. Valve area (Vmax): 0.83 cm^2. Valve area    (Vmean): 0.85 cm^2.  - Left atrium: The atrium was moderately dilated.  - Atrial septum: No defect or patent foramen ovale was identified. __________   TEE 05/2016: - Aortic valve: Valve area (VTI): 1.94 cm^2. Valve area (Vmax):    1.99 cm^2. Valve area (Vmean): 2.06 cm^2.  - Impressions: Pre-TAVR: EF 50-55% posterior basal akinesis. No    effusion. Mild MR. Normal RV with mild TR. No ASD/PFO. Aortic     valve trileaflet    and severely calcified. Peak velocity 4.3 m/sec, peak gradient 73    mmHg mean gradient 45 mmHg. Mild to moderate AR.       Post TAVR: Slightly high plaecement of 26 mm Sapien 3 valve.    Trivial perivalvular regurgitation at 2:00 on SA views. Peak    gradient 4 mmHg    mean gradient 2 mmHg peak velocity .98 m/sec AVA 2.36 cm2. EF    remains 50-55% with posteior basal akinesis No new RWMA;s Mild MR    RV normal with no increase in PA pressures No new effusion aorti    root intact.   Impressions:   - Pre-TAVR: EF 50-55% posterior basal akinesis. No effusion. Mild    MR. Normal RV with mild TR. No ASD/PFO. Aortic  valve trileaflet    and severely calcified. Peak velocity 4.3 m/sec, peak gradient 73    mmHg mean gradient 45 mmHg. Mild to moderate AR. __________   [facility] 05/2016: Prox RCA lesion, 100% stenosed. SVG was injected is normal in caliber. There is mild diffuse disease in the graft. Mid Cx lesion, 100% stenosed. SVG was injected is normal in caliber. The graft exhibits minimal luminal irregularities. was injected is normal in caliber. There is severe focal disease in the graft. 2nd Mrg lesion, 80% stenosed. LIMA due to inability to cannulate and is normal in caliber, and is anatomically normal. Prox LAD lesion, 100% stenosed. Mid Graft lesion, 50% stenosed. RIMA was visualized by non-selective angiography is and is anatomically normal.   1. Right heart catheterization showed high normal pulmonary pressure and filling pressures. Normal cardiac output. 2. Severe underlying three-vessel coronary artery disease with patent grafts. 3. Moderate left subclavian stenosis not significant by gradients.   Recommendations: No revascularization is needed before TAVR.   EKG:  EKG is ordered today.  The EKG ordered today demonstrates AV paced rhythm, 79 bpm, intermittent PVCs  Recent Labs: 01/07/2022: ALT 45 03/25/2022: BUN 17; Creatinine, Ser 1.13; Hemoglobin  14.7; Platelets 114; Potassium 3.9; Sodium 136  Recent Lipid Panel    Component Value Date/Time   CHOL 144 01/07/2022 0754   TRIG 120 01/07/2022 0754   HDL 56 01/07/2022 0754   CHOLHDL 2.6 01/07/2022 0754   CHOLHDL 3.0 12/18/2020 1635   VLDL 23 12/18/2020 1635   LDLCALC 67 01/07/2022 0754   LDLDIRECT 83.3 12/18/2020 1635    PHYSICAL EXAM:    VS:  BP 120/70 (BP Location: Left Arm, Patient Position: Sitting, Cuff Size: Normal)   Pulse 79   Ht '5\' 9"'$  (1.753 m)   Wt 195 lb 9.6 oz (88.7 kg)   SpO2 94%   BMI 28.89 kg/m   BMI: Body mass index is 28.89 kg/m.  Physical Exam Vitals reviewed.  Constitutional:      Appearance: He is well-developed.  HENT:     Head: Normocephalic and atraumatic.  Eyes:     General:        Right eye: No discharge.        Left eye: No discharge.  Cardiovascular:     Rate and Rhythm: Normal rate and regular rhythm.     Pulses:          Posterior tibial pulses are 2+ on the right side and 2+ on the left side.     Heart sounds: S1 normal and S2 normal. Heart sounds not distant. No midsystolic click and no opening snap. Murmur heard.     Systolic murmur is present with a grade of 2/6 at the upper right sternal border.     No friction rub.  Pulmonary:     Effort: Pulmonary effort is normal. No respiratory distress.     Breath sounds: Normal breath sounds. No decreased breath sounds, wheezing or rales.  Chest:     Chest wall: No tenderness.  Abdominal:     General: There is no distension.  Musculoskeletal:     Cervical back: Normal range of motion.     Right lower leg: No edema.     Left lower leg: No edema.  Skin:    General: Skin is warm and dry.     Nails: There is no clubbing.  Neurological:     Mental Status: He is alert and oriented to person, place, and time.  Psychiatric:  Speech: Speech normal.        Behavior: Behavior normal.        Thought Content: Thought content normal.        Judgment: Judgment normal.     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  Most recent cardiac cath in 2021 showed stable coronary anatomy.  Continue aggressive resector modification and secondary prevention.  He remains on aspirin, clopidogrel, carvedilol, atorvastatin, ezetimibe, and Imdur.  HFrEF secondary to ICM: He appears euvolemic and well compensated with NYHA class III symptoms.  I did caution him on overdoing it with recommendation for him to delegate strenuous tasks and listen to his body.  Continue lower dose Entresto 24/26 mg twice daily, carvedilol, and spironolactone.  Not requiring a standing diuretic.  Intermittent hypotension precludes escalation of GDMT at this time.  Aortic stenosis status post TAVR: Normal functioning prosthesis on most recent echo.  HTN: Blood pressure is stable in the office today.  Continue current medical therapy as outlined above.  PAD: No reported symptoms of lifestyle limiting claudication.  This was not discussed in detail at today's visit.  HLD: LDL 67 in 12/2021.  He remains on atorvastatin and ezetimibe.    Disposition: F/u with Dr. Fletcher Anon or an APP in 6 months, and EP as directed.    Medication Adjustments/Labs and Tests Ordered: Current medicines are reviewed at length with the patient today.  Concerns regarding medicines are outlined above. Medication changes, Labs and Tests ordered today are summarized above and listed in the Patient Instructions accessible in Encounters.   Signed, Christell Faith, PA-C 06/29/2022 5:29 PM     [address] [place], [place] [address] [phone]

## 2022-06-29 ENCOUNTER — Encounter: Payer: Self-pay | Admitting: Pain Medicine

## 2022-06-29 ENCOUNTER — Ambulatory Visit: Payer: Medicare Other | Attending: Pain Medicine | Admitting: Pain Medicine

## 2022-06-29 ENCOUNTER — Ambulatory Visit: Payer: Medicare Other | Admitting: Physician Assistant

## 2022-06-29 ENCOUNTER — Encounter: Payer: Self-pay | Admitting: Physician Assistant

## 2022-06-29 VITALS — BP 120/70 | HR 79 | Ht 69.0 in | Wt 195.6 lb

## 2022-06-29 VITALS — BP 120/69 | HR 69 | Temp 97.2°F | Ht 69.0 in | Wt 200.0 lb

## 2022-06-29 DIAGNOSIS — I5022 Chronic systolic (congestive) heart failure: Secondary | ICD-10-CM

## 2022-06-29 DIAGNOSIS — I25118 Atherosclerotic heart disease of native coronary artery with other forms of angina pectoris: Secondary | ICD-10-CM

## 2022-06-29 DIAGNOSIS — G8929 Other chronic pain: Secondary | ICD-10-CM | POA: Insufficient documentation

## 2022-06-29 DIAGNOSIS — I10 Essential (primary) hypertension: Secondary | ICD-10-CM | POA: Diagnosis not present

## 2022-06-29 DIAGNOSIS — E785 Hyperlipidemia, unspecified: Secondary | ICD-10-CM

## 2022-06-29 DIAGNOSIS — M25551 Pain in right hip: Secondary | ICD-10-CM | POA: Diagnosis present

## 2022-06-29 DIAGNOSIS — M47816 Spondylosis without myelopathy or radiculopathy, lumbar region: Secondary | ICD-10-CM | POA: Diagnosis not present

## 2022-06-29 DIAGNOSIS — M545 Low back pain, unspecified: Secondary | ICD-10-CM | POA: Diagnosis not present

## 2022-06-29 DIAGNOSIS — I255 Ischemic cardiomyopathy: Secondary | ICD-10-CM

## 2022-06-29 DIAGNOSIS — I739 Peripheral vascular disease, unspecified: Secondary | ICD-10-CM

## 2022-06-29 DIAGNOSIS — Z951 Presence of aortocoronary bypass graft: Secondary | ICD-10-CM

## 2022-06-29 DIAGNOSIS — Z952 Presence of prosthetic heart valve: Secondary | ICD-10-CM | POA: Diagnosis not present

## 2022-06-29 DIAGNOSIS — M1611 Unilateral primary osteoarthritis, right hip: Secondary | ICD-10-CM | POA: Insufficient documentation

## 2022-06-29 NOTE — Progress Notes (Signed)
Safety precautions to be maintained throughout the outpatient stay will include: orient to surroundings, keep bed in low position, maintain call bell within reach at all times, provide assistance with transfer out of bed and ambulation.  

## 2022-06-29 NOTE — Patient Instructions (Signed)
Medication Instructions:  ?- Your physician recommends that you continue on your current medications as directed. Please refer to the Current Medication list given to you today. ? ?*If you need a refill on your cardiac medications before your next appointment, please call your pharmacy* ? ? ?Lab Work: ?- none ordered ? ?If you have labs (blood work) drawn today and your tests are completely normal, you will receive your results only by: ?MyChart Message (if you have MyChart) OR ?A paper copy in the mail ?If you have any lab test that is abnormal or we need to change your treatment, we will call you to review the results. ? ? ?Testing/Procedures: ?- none ordered ? ? ?Follow-Up: ?At [facility], you and your health needs are our priority.  As part of our continuing mission to provide you with exceptional heart care, we have created designated Provider Care Teams.  These Care Teams include your primary Cardiologist (physician) and Advanced Practice Providers (APPs -  Physician Assistants and Nurse Practitioners) who all work together to provide you with the care you need, when you need it. ? ?We recommend signing up for the patient portal called "MyChart".  Sign up information is provided on this After Visit Summary.  MyChart is used to connect with patients for Virtual Visits (Telemedicine).  Patients are able to view lab/test results, encounter notes, upcoming appointments, etc.  Non-urgent messages can be sent to your provider as well.   ?To learn more about what you can do with MyChart, go to [email or web address].   ? ?Your next appointment:   ?6 month(s) ? ?The format for your next appointment:   ?In Person ? ?Provider:   ?You may see Muhammad Arida, MD or one of the following Advanced Practice Providers on your designated Care Team:   ?Ryan Dunn, PA-C ? ? ? ?Other Instructions ?N/a ? ?Important Information About Sugar ? ? ? ? ? ? ?

## 2022-07-08 ENCOUNTER — Ambulatory Visit: Payer: Medicare Other | Admitting: Dermatology

## 2022-07-08 DIAGNOSIS — S0120XA Unspecified open wound of nose, initial encounter: Secondary | ICD-10-CM

## 2022-07-08 DIAGNOSIS — D049 Carcinoma in situ of skin, unspecified: Secondary | ICD-10-CM

## 2022-07-08 DIAGNOSIS — L814 Other melanin hyperpigmentation: Secondary | ICD-10-CM

## 2022-07-08 DIAGNOSIS — L821 Other seborrheic keratosis: Secondary | ICD-10-CM

## 2022-07-08 DIAGNOSIS — L570 Actinic keratosis: Secondary | ICD-10-CM

## 2022-07-08 DIAGNOSIS — Z1283 Encounter for screening for malignant neoplasm of skin: Secondary | ICD-10-CM

## 2022-07-08 DIAGNOSIS — D229 Melanocytic nevi, unspecified: Secondary | ICD-10-CM

## 2022-07-08 DIAGNOSIS — Z85828 Personal history of other malignant neoplasm of skin: Secondary | ICD-10-CM

## 2022-07-08 DIAGNOSIS — Z872 Personal history of diseases of the skin and subcutaneous tissue: Secondary | ICD-10-CM

## 2022-07-08 DIAGNOSIS — T148XXA Other injury of unspecified body region, initial encounter: Secondary | ICD-10-CM

## 2022-07-08 DIAGNOSIS — D1800 Hemangioma unspecified site: Secondary | ICD-10-CM

## 2022-07-08 DIAGNOSIS — D099 Carcinoma in situ, unspecified: Secondary | ICD-10-CM

## 2022-07-08 MED ORDER — MUPIROCIN 2 % EX OINT: 1.0000 | TOPICAL_OINTMENT | Freq: Every day | CUTANEOUS | 1 refills | Status: DC

## 2022-07-08 NOTE — Progress Notes (Signed)
Follow-Up Visit   Subjective  Matthew [name] is a 86 y.o. male who presents for the following: UBSE (The patient presents for Upper Body Skin Exam (UBSE) for skin cancer screening and mole check.  The patient has spots, moles and lesions to be evaluated, some may be new or changing and the patient has concerns that these could be cancer. Patient with hx of SCC and AK's. /).  Patient accompanied by daughter who contributes to history.  The following portions of the chart were reviewed this encounter and updated as appropriate:   Tobacco  Allergies  Meds  Problems  Med Hx  Surg Hx  Fam Hx      Review of Systems:  No other skin or systemic complaints except as noted in HPI or Assessment and Plan.  Objective  Well appearing patient in no apparent distress; mood and affect are within normal limits.  All skin waist up examined.  L dorsal hand x 2, L forearm x 1, L upper arm x 8, L shoulder x 6, mid chest x 3, R sup shoulder x 2, R ant shoulder x 1, R hand x 1, R upper arm x 6, R forearm x 7, R cheek x 1, L helix x 2, L temple x 1, nose x 2, R post shoulder x 2, R post calf x 1 (46) Erythematous thin papules/macules with gritty scale.   left nare Erosion     Assessment & Plan  AK (actinic keratosis) (46) L dorsal hand x 2, L forearm x 1, L upper arm x 8, L shoulder x 6, mid chest x 3, R sup shoulder x 2, R ant shoulder x 1, R hand x 1, R upper arm x 6, R forearm x 7, R cheek x 1, L helix x 2, L temple x 1, nose x 2, R post shoulder x 2, R post calf x 1  Actinic keratoses are precancerous spots that appear secondary to cumulative UV radiation exposure/sun exposure over time. They are chronic with expected duration over 1 year. A portion of actinic keratoses will progress to squamous cell carcinoma of the skin. It is not possible to reliably predict which spots will progress to skin cancer and so treatment is recommended to prevent development of skin cancer.  Recommend daily broad  spectrum sunscreen SPF 30+ to sun-exposed areas, reapply every 2 hours as needed.  Recommend staying in the shade or wearing long sleeves, sun glasses (UVA+UVB protection) and wide brim hats (4-inch brim around the entire circumference of the hat). Call for new or changing lesions.  Prior to procedure, discussed risks of blister formation, small wound, skin dyspigmentation, or rare scar following cryotherapy. Recommend Vaseline ointment to treated areas while healing.   Destruction of lesion - L dorsal hand x 2, L forearm x 1, L upper arm x 8, L shoulder x 6, mid chest x 3, R sup shoulder x 2, R ant shoulder x 1, R hand x 1, R upper arm x 6, R forearm x 7, R cheek x 1, L helix x 2, L temple x 1, nose x 2, R post shoulder x 2, R post calf x 1  Destruction method: cryotherapy   Informed consent: discussed and consent obtained   Lesion destroyed using liquid nitrogen: Yes   Cryotherapy cycles:  2 Outcome: patient tolerated procedure well with no complications   Post-procedure details: wound care instructions given    Open wound left nare  Start mupirocin daily   Squamous  cell carcinoma in situ  Related Medications mupirocin ointment (BACTROBAN) 2 % Apply 1 Application topically daily.   Lentigines - Scattered tan macules - Due to sun exposure - Benign-appearing, observe - Recommend daily broad spectrum sunscreen SPF 30+ to sun-exposed areas, reapply every 2 hours as needed. - Call for any changes  Seborrheic Keratoses - Stuck-on, waxy, tan-brown papules and/or plaques  - Benign-appearing - Discussed benign etiology and prognosis. - Observe - Call for any changes  Melanocytic Nevi - Tan-brown and/or pink-flesh-colored symmetric macules and papules - Benign appearing on exam today - Observation - Call clinic for new or changing moles - Recommend daily use of broad spectrum spf 30+ sunscreen to sun-exposed areas.   Hemangiomas - Red papules - Discussed benign nature -  Observe - Call for any changes  Actinic Damage - Chronic condition, secondary to cumulative UV/sun exposure - diffuse scaly erythematous macules with underlying dyspigmentation - Recommend daily broad spectrum sunscreen SPF 30+ to sun-exposed areas, reapply every 2 hours as needed.  - Staying in the shade or wearing long sleeves, sun glasses (UVA+UVB protection) and wide brim hats (4-inch brim around the entire circumference of the hat) are also recommended for sun protection.  - Call for new or changing lesions.  Skin cancer screening performed today.  History of Squamous Cell Carcinoma of the Skin - No evidence of recurrence today - No lymphadenopathy - Recommend regular full body skin exams - Recommend daily broad spectrum sunscreen SPF 30+ to sun-exposed areas, reapply every 2 hours as needed.  - Call if any new or changing lesions are noted between office visits   History of PreCancerous Actinic Keratosis  - site(s) of PreCancerous Actinic Keratosis clear today. - these may recur and new lesions may form requiring treatment to prevent transformation into skin cancer - observe for new or changing spots and contact [facility] for appointment if occur - photoprotection with sun protective clothing; sunglasses and broad spectrum sunscreen with SPF of at least 30 + and frequent self skin exams recommended - yearly exams by a dermatologist recommended for persons with history of PreCancerous Actinic Keratoses  Return in about 6 months (around 01/08/2023) for AK follow up.  Graciella Belton, RMA, am acting as scribe for Forest Gleason, MD .  Documentation: I have reviewed the above documentation for accuracy and completeness, and I agree with the above.  Forest Gleason, MD

## 2022-07-08 NOTE — Patient Instructions (Addendum)
Cryotherapy Aftercare  Wash gently with soap and water everyday.   Apply Vaseline and Band-Aid daily until healed.   Melanoma ABCDEs  Melanoma is the most dangerous type of skin cancer, and is the leading cause of death from skin disease.  You are more likely to develop melanoma if you: Have light-colored skin, light-colored eyes, or red or blond hair Spend a lot of time in the sun Tan regularly, either outdoors or in a tanning bed Have had blistering sunburns, especially during childhood Have a close family member who has had a melanoma Have atypical moles or large birthmarks  Early detection of melanoma is key since treatment is typically straightforward and cure rates are extremely high if we catch it early.   The first sign of melanoma is often a change in a mole or a new dark spot.  The ABCDE system is a way of remembering the signs of melanoma.  A for asymmetry:  The two halves do not match. B for border:  The edges of the growth are irregular. C for color:  A mixture of colors are present instead of an even brown color. D for diameter:  Melanomas are usually (but not always) greater than 6mm - the size of a pencil eraser. E for evolution:  The spot keeps changing in size, shape, and color.  Please check your skin once per month between visits. You can use a small mirror in front and a large mirror behind you to keep an eye on the back side or your body.   If you see any new or changing lesions before your next follow-up, please call to schedule a visit.  Please continue daily skin protection including broad spectrum sunscreen SPF 30+ to sun-exposed areas, reapplying every 2 hours as needed when you're outdoors.     Due to recent changes in healthcare laws, you may see results of your pathology and/or laboratory studies on MyChart before the doctors have had a chance to review them. We understand that in some cases there may be results that are confusing or concerning to you.  Please understand that not all results are received at the same time and often the doctors may need to interpret multiple results in order to provide you with the best plan of care or course of treatment. Therefore, we ask that you please give us 2 business days to thoroughly review all your results before contacting the office for clarification. Should we see a critical lab result, you will be contacted sooner.   If You Need Anything After Your Visit  If you have any questions or concerns for your doctor, please call our main line at [phone] and press option 4 to reach your doctor's medical assistant. If no one answers, please leave a voicemail as directed and we will return your call as soon as possible. Messages left after 4 pm will be answered the following business day.   You may also send us a message via MyChart. We typically respond to MyChart messages within 1-2 business days.  For prescription refills, please ask your pharmacy to contact our office. Our fax number is [phone].  If you have an urgent issue when the clinic is closed that cannot wait until the next business day, you can page your doctor at the number below.    Please note that while we do our best to be available for urgent issues outside of office hours, we are not available 24/7.   If you have an urgent   issue and are unable to reach us, you may choose to seek medical care at your doctor's office, retail clinic, urgent care center, or emergency room.  If you have a medical emergency, please immediately call 911 or go to the emergency department.  Pager Numbers  - Dr. Kowalski: [phone]  - Dr. Moye: [phone]  - Dr. Stewart: [phone]  In the event of inclement weather, please call our main line at [phone] for an update on the status of any delays or closures.  Dermatology Medication Tips: Please keep the boxes that topical medications come in in order to help keep track of the  instructions about where and how to use these. Pharmacies typically print the medication instructions only on the boxes and not directly on the medication tubes.   If your medication is too expensive, please contact our office at [phone] option 4 or send us a message through MyChart.   We are unable to tell what your co-pay for medications will be in advance as this is different depending on your insurance coverage. However, we may be able to find a substitute medication at lower cost or fill out paperwork to get insurance to cover a needed medication.   If a prior authorization is required to get your medication covered by your insurance company, please allow us 1-2 business days to complete this process.  Drug prices often vary depending on where the prescription is filled and some pharmacies may offer cheaper prices.  The website www.goodrx.com contains coupons for medications through different pharmacies. The prices here do not account for what the cost may be with help from insurance (it may be cheaper with your insurance), but the website can give you the price if you did not use any insurance.  - You can print the associated coupon and take it with your prescription to the pharmacy.  - You may also stop by our office during regular business hours and pick up a GoodRx coupon card.  - If you need your prescription sent electronically to a different pharmacy, notify our office through [facility] MyChart or by phone at [phone] option 4.     Si Usted Necesita Algo Despus de Su Visita  Tambin puede enviarnos un mensaje a travs de MyChart. Por lo general respondemos a los mensajes de MyChart en el transcurso de 1 a 2 das hbiles.  Para renovar recetas, por favor pida a su farmacia que se ponga en contacto con nuestra oficina. Nuestro nmero de fax es el [phone].  Si tiene un asunto urgente cuando la clnica est cerrada y que no puede esperar hasta el siguiente da hbil,  puede llamar/localizar a su doctor(a) al nmero que aparece a continuacin.   Por favor, tenga en cuenta que aunque hacemos todo lo posible para estar disponibles para asuntos urgentes fuera del horario de oficina, no estamos disponibles las 24 horas del da, los 7 das de la semana.   Si tiene un problema urgente y no puede comunicarse con nosotros, puede optar por buscar atencin mdica  en el consultorio de su doctor(a), en una clnica privada, en un centro de atencin urgente o en una sala de emergencias.  Si tiene una emergencia mdica, por favor llame inmediatamente al 911 o vaya a la sala de emergencias.  Nmeros de bper  - Dr. Kowalski: [phone]  - Dra. Moye: [phone]  - Dra. Stewart: [phone]  En caso de inclemencias del tiempo, por favor llame a nuestra lnea principal al [phone] para una actualizacin   sobre el estado de cualquier retraso o cierre.  Consejos para la medicacin en dermatologa: Por favor, guarde las cajas en las que vienen los medicamentos de uso tpico para ayudarle a seguir las instrucciones sobre dnde y cmo usarlos. Las farmacias generalmente imprimen las instrucciones del medicamento slo en las cajas y no directamente en los tubos del medicamento.   Si su medicamento es muy caro, por favor, pngase en contacto con nuestra oficina llamando al [phone] y presione la opcin 4 o envenos un mensaje a travs de MyChart.   No podemos decirle cul ser su copago por los medicamentos por adelantado ya que esto es diferente dependiendo de la cobertura de su seguro. Sin embargo, es posible que podamos encontrar un medicamento sustituto a menor costo o llenar un formulario para que el seguro cubra el medicamento que se considera necesario.   Si se requiere una autorizacin previa para que su compaa de seguros cubra su medicamento, por favor permtanos de 1 a 2 das hbiles para completar este proceso.  Los precios de los medicamentos varan con  frecuencia dependiendo del lugar de dnde se surte la receta y alguna farmacias pueden ofrecer precios ms baratos.  El sitio web www.goodrx.com tiene cupones para medicamentos de diferentes farmacias. Los precios aqu no tienen en cuenta lo que podra costar con la ayuda del seguro (puede ser ms barato con su seguro), pero el sitio web puede darle el precio si no utiliz ningn seguro.  - Puede imprimir el cupn correspondiente y llevarlo con su receta a la farmacia.  - Tambin puede pasar por nuestra oficina durante el horario de atencin regular y recoger una tarjeta de cupones de GoodRx.  - Si necesita que su receta se enve electrnicamente a una farmacia diferente, informe a nuestra oficina a travs de MyChart de [place] [facility] o por telfono llamando al [phone] y presione la opcin 4.  

## 2022-07-12 ENCOUNTER — Encounter: Payer: Self-pay | Admitting: Dermatology

## 2022-07-25 ENCOUNTER — Other Ambulatory Visit: Payer: Self-pay | Admitting: Internal Medicine

## 2022-07-27 ENCOUNTER — Ambulatory Visit: Payer: Medicare Other

## 2022-08-21 ENCOUNTER — Other Ambulatory Visit: Payer: Self-pay | Admitting: Cardiovascular Disease

## 2022-09-04 ENCOUNTER — Other Ambulatory Visit: Payer: Self-pay | Admitting: Cardiovascular Disease

## 2022-09-10 ENCOUNTER — Encounter: Payer: Self-pay | Admitting: Cardiovascular Disease

## 2022-09-11 ENCOUNTER — Other Ambulatory Visit: Payer: Self-pay | Admitting: Cardiovascular Disease

## 2022-09-27 LAB — CUP PACEART REMOTE DEVICE CHECK
Battery Remaining Longevity: 40 mo
Battery Remaining Percentage: 54 %
Battery Voltage: 2.98 V
Brady Statistic AP VP Percent: 82 %
Brady Statistic AP VS Percent: 1 %
Brady Statistic AS VP Percent: 14 %
Brady Statistic AS VS Percent: 1 %
Brady Statistic RA Percent Paced: 80 %
Date Time Interrogation Session: 20230815020008
Implantable Lead Implant Date: 20200810
Implantable Lead Implant Date: 20200810
Implantable Lead Implant Date: 20200810
Implantable Lead Location: 753858
Implantable Lead Location: 753859
Implantable Lead Location: 753860
Implantable Lead Model: 5076
Implantable Lead Model: 5076
Implantable Pulse Generator Implant Date: 20200810
Lead Channel Impedance Value: 390 Ohm
Lead Channel Impedance Value: 410 Ohm
Lead Channel Impedance Value: 540 Ohm
Lead Channel Pacing Threshold Amplitude: 0.75 V
Lead Channel Pacing Threshold Amplitude: 1 V
Lead Channel Pacing Threshold Amplitude: 1 V
Lead Channel Pacing Threshold Pulse Width: 0.4 ms
Lead Channel Pacing Threshold Pulse Width: 0.4 ms
Lead Channel Pacing Threshold Pulse Width: 1 ms
Lead Channel Sensing Intrinsic Amplitude: 1.6 mV
Lead Channel Sensing Intrinsic Amplitude: 10.5 mV
Lead Channel Setting Pacing Amplitude: 2 V
Lead Channel Setting Pacing Amplitude: 2 V
Lead Channel Setting Pacing Amplitude: 2 V
Lead Channel Setting Pacing Pulse Width: 0.4 ms
Lead Channel Setting Pacing Pulse Width: 1 ms
Lead Channel Setting Sensing Sensitivity: 2 mV
Pulse Gen Model: 3562
Pulse Gen Serial Number: 9130392

## 2022-10-05 ENCOUNTER — Encounter: Payer: Self-pay | Admitting: Cardiovascular Disease

## 2022-10-06 MED ORDER — ENTRESTO 24-26 MG PO TABS: 1.0000 | ORAL_TABLET | Freq: Two times a day (BID) | ORAL | 3 refills | Status: DC

## 2022-10-06 NOTE — Telephone Encounter (Signed)
Good morning,   I am a nurse at the [facility], helping with [facility] triage this week. We do not have a list of your nurses to route things to. Please advise on how you would like to proceed?   Thanks,  Gerald Stabs, RN

## 2022-10-26 ENCOUNTER — Ambulatory Visit (INDEPENDENT_AMBULATORY_CARE_PROVIDER_SITE_OTHER): Payer: Medicare Other

## 2022-10-26 DIAGNOSIS — I255 Ischemic cardiomyopathy: Secondary | ICD-10-CM

## 2022-10-29 LAB — CUP PACEART REMOTE DEVICE CHECK
Battery Remaining Longevity: 37 mo
Battery Remaining Percentage: 50 %
Battery Voltage: 2.98 V
Brady Statistic AP VP Percent: 81 %
Brady Statistic AP VS Percent: 1 %
Brady Statistic AS VP Percent: 14 %
Brady Statistic AS VS Percent: 1 %
Brady Statistic RA Percent Paced: 79 %
Date Time Interrogation Session: 20231114020012
Implantable Lead Connection Status: 753985
Implantable Lead Connection Status: 753985
Implantable Lead Connection Status: 753985
Implantable Lead Implant Date: 20200810
Implantable Lead Implant Date: 20200810
Implantable Lead Implant Date: 20200810
Implantable Lead Location: 753858
Implantable Lead Location: 753859
Implantable Lead Location: 753860
Implantable Lead Model: 5076
Implantable Lead Model: 5076
Implantable Pulse Generator Implant Date: 20200810
Lead Channel Impedance Value: 410 Ohm
Lead Channel Impedance Value: 430 Ohm
Lead Channel Impedance Value: 590 Ohm
Lead Channel Pacing Threshold Amplitude: 0.75 V
Lead Channel Pacing Threshold Amplitude: 1 V
Lead Channel Pacing Threshold Amplitude: 1 V
Lead Channel Pacing Threshold Pulse Width: 0.4 ms
Lead Channel Pacing Threshold Pulse Width: 0.4 ms
Lead Channel Pacing Threshold Pulse Width: 1 ms
Lead Channel Sensing Intrinsic Amplitude: 1.5 mV
Lead Channel Sensing Intrinsic Amplitude: 12 mV
Lead Channel Setting Pacing Amplitude: 2 V
Lead Channel Setting Pacing Amplitude: 2 V
Lead Channel Setting Pacing Amplitude: 2 V
Lead Channel Setting Pacing Pulse Width: 0.4 ms
Lead Channel Setting Pacing Pulse Width: 1 ms
Lead Channel Setting Sensing Sensitivity: 2 mV
Pulse Gen Model: 3562
Pulse Gen Serial Number: 9130392

## 2022-11-16 ENCOUNTER — Other Ambulatory Visit: Payer: Self-pay | Admitting: Cardiovascular Disease

## 2022-11-24 NOTE — Progress Notes (Signed)
Remote pacemaker transmission.   

## 2022-12-15 ENCOUNTER — Encounter: Payer: Self-pay | Admitting: Cardiovascular Disease

## 2022-12-15 MED ORDER — ENTRESTO 24-26 MG PO TABS: 1.0000 | ORAL_TABLET | Freq: Two times a day (BID) | ORAL | 0 refills | Status: DC

## 2022-12-15 MED ORDER — SPIRONOLACTONE 25 MG PO TABS: 12.5000 mg | ORAL_TABLET | Freq: Every day | ORAL | 0 refills | Status: DC

## 2022-12-17 MED ORDER — EZETIMIBE 10 MG PO TABS: 10.0000 mg | ORAL_TABLET | Freq: Every day | ORAL | 3 refills | Status: DC

## 2022-12-17 MED ORDER — ATORVASTATIN CALCIUM 80 MG PO TABS: 80.0000 mg | ORAL_TABLET | Freq: Every day | ORAL | 3 refills | Status: DC

## 2022-12-17 NOTE — Addendum Note (Signed)
Addended by: Ricci Barker on: 12/17/2022 08:21 AM   Modules accepted: Orders

## 2022-12-20 DIAGNOSIS — M2041 Other hammer toe(s) (acquired), right foot: Secondary | ICD-10-CM | POA: Diagnosis not present

## 2022-12-20 DIAGNOSIS — M2042 Other hammer toe(s) (acquired), left foot: Secondary | ICD-10-CM | POA: Diagnosis not present

## 2022-12-20 DIAGNOSIS — M79674 Pain in right toe(s): Secondary | ICD-10-CM | POA: Diagnosis not present

## 2022-12-20 DIAGNOSIS — L600 Ingrowing nail: Secondary | ICD-10-CM | POA: Diagnosis not present

## 2022-12-20 DIAGNOSIS — B351 Tinea unguium: Secondary | ICD-10-CM | POA: Diagnosis not present

## 2023-01-06 ENCOUNTER — Encounter: Payer: Self-pay | Admitting: Oncology

## 2023-01-13 ENCOUNTER — Encounter: Payer: Self-pay | Admitting: Dermatology

## 2023-01-13 ENCOUNTER — Ambulatory Visit: Payer: Medicare HMO | Admitting: Dermatology

## 2023-01-13 VITALS — BP 119/63 | HR 69

## 2023-01-13 DIAGNOSIS — L821 Other seborrheic keratosis: Secondary | ICD-10-CM | POA: Diagnosis not present

## 2023-01-13 DIAGNOSIS — D492 Neoplasm of unspecified behavior of bone, soft tissue, and skin: Secondary | ICD-10-CM

## 2023-01-13 DIAGNOSIS — B078 Other viral warts: Secondary | ICD-10-CM | POA: Diagnosis not present

## 2023-01-13 DIAGNOSIS — L570 Actinic keratosis: Secondary | ICD-10-CM

## 2023-01-13 DIAGNOSIS — L299 Pruritus, unspecified: Secondary | ICD-10-CM

## 2023-01-13 NOTE — Progress Notes (Signed)
Follow-Up Visit   Subjective  Matthew [name] is a 87 y.o. male who presents for the following: Actinic Keratosis (6 month follow up. Hands, arms, chest, face, nose, left ear, right posterior leg).  The patient has spots, moles and lesions to be evaluated, some may be new or changing and the patient has concerns that these could be cancer.   The following portions of the chart were reviewed this encounter and updated as appropriate:  Tobacco  Allergies  Meds  Problems  Med Hx  Surg Hx  Fam Hx      Review of Systems: No other skin or systemic complaints except as noted in HPI or Assessment and Plan.   Objective  Well appearing patient in no apparent distress; mood and affect are within normal limits.  A focused examination was performed including head, arms, hands, chest, back, abdomen. Relevant physical exam findings are noted in the Assessment and Plan.  Right Mid Back 0.8 cm cutaneous horn       Right Upper Back x1, right upper arm x5, right thumb x1, left forearm x1, left dorsal hand x3, left thumb x1, left upper arm x7, left cheek x1 (20) Erythematous hyperkeratotic papules  Back Normal appearing skin   Assessment & Plan  Neoplasm of skin Right Mid Back  Epidermal / dermal shaving  Lesion diameter (cm):  0.8 Informed consent: discussed and consent obtained   Patient was prepped and draped in usual sterile fashion: Area prepped with alcohol. Anesthesia: the lesion was anesthetized in a standard fashion   Anesthetic:  1% lidocaine w/ epinephrine 1-100,000 buffered w/ 8.4% NaHCO3 Instrument used: flexible razor blade   Hemostasis achieved with: pressure, aluminum chloride and electrodesiccation   Outcome: patient tolerated procedure well   Post-procedure details: wound care instructions given   Post-procedure details comment:  Ointment and small bandage applied  Specimen 1 - Surgical pathology Differential Diagnosis: ISK vs SCC  Check Margins:  No    Hypertrophic actinic keratosis (20) Right Upper Back x1, right upper arm x5, right thumb x1, left forearm x1, left dorsal hand x3, left thumb x1, left upper arm x7, left cheek x1  Actinic keratoses are precancerous spots that appear secondary to cumulative UV radiation exposure/sun exposure over time. They are chronic with expected duration over 1 year. A portion of actinic keratoses will progress to squamous cell carcinoma of the skin. It is not possible to reliably predict which spots will progress to skin cancer and so treatment is recommended to prevent development of skin cancer.  Recommend daily broad spectrum sunscreen SPF 30+ to sun-exposed areas, reapply every 2 hours as needed.  Recommend staying in the shade or wearing long sleeves, sun glasses (UVA+UVB protection) and wide brim hats (4-inch brim around the entire circumference of the hat). Call for new or changing lesions.  Destruction of lesion - Right Upper Back x1, right upper arm x5, right thumb x1, left forearm x1, left dorsal hand x3, left thumb x1, left upper arm x7, left cheek x1  Destruction method: cryotherapy   Informed consent: discussed and consent obtained   Lesion destroyed using liquid nitrogen: Yes   Region frozen until ice ball extended beyond lesion: Yes   Outcome: patient tolerated procedure well with no complications   Post-procedure details: wound care instructions given   Additional details:  Prior to procedure, discussed risks of blister formation, small wound, skin dyspigmentation, or rare scar following cryotherapy. Recommend Vaseline ointment to treated areas while healing.   Pruritus  Back  Recommend OTC Gold Bond Rapid Relief Anti-Itch cream (pramoxine + menthol), CeraVe Anti-itch cream or lotion (pramoxine), Sarna lotion (Original- menthol + camphor or Sensitive- pramoxine) or Eucerin 12 hour Itch Relief lotion (menthol) up to 3 times per day to areas on body that are itchy.    Actinic  Damage with PreCancerous Actinic Keratoses Patient exhibits: - Severe, confluent actinic changes with pre-cancerous actinic keratoses that is secondary to cumulative UV radiation exposure over time - Condition that is severe; chronic, not at goal. - diffuse scaly erythematous macules and papules with underlying dyspigmentation - Cardiologist does not believe patient would tolerate PDT. Discussed option of topical chemotherapy but also concerned about whether the discomfort could cause stress on his heart.  - Will monitor closely for new or changing lesions and continue surveillance with cryotherapy to hypertrophic AKs - Recommend daily broad spectrum sunscreen SPF 30+ to sun-exposed areas, reapply every 2 hours as needed.  - Staying in the shade or wearing long sleeves, sun glasses (UVA+UVB protection) and wide brim hats (4-inch brim around the entire circumference of the hat) are also recommended. - Call for new or changing lesions.   Seborrheic Keratoses - Stuck-on, waxy, tan-brown papules and/or plaques  - Benign-appearing - Discussed benign etiology and prognosis. - Observe - Call for any changes   Return in about 6 months (around 07/14/2023) for AK Follow Up.  I, Matthew [name], CMA, am acting as scribe for Matthew Gleason, MD.  Documentation: I have reviewed the above documentation for accuracy and completeness, and I agree with the above.  Matthew Gleason, MD

## 2023-01-13 NOTE — Patient Instructions (Addendum)
Recommend OTC Gold Bond Rapid Relief Anti-Itch cream (pramoxine + menthol), CeraVe Anti-itch cream or lotion (pramoxine), Sarna lotion (Original- menthol + camphor or Sensitive- pramoxine) or Eucerin 12 hour Itch Relief lotion (menthol) up to 3 times per day to areas on body that are itchy.   Cryotherapy Aftercare  Wash gently with soap and water everyday.   Apply Vaseline daily until healed.    Recommend using Bleed Stop powder if biopsy site begins to bleed.    Wound Care Instructions  Cleanse wound gently with soap and water once a day then pat dry with clean gauze. Apply a thin coat of Petrolatum (petroleum jelly, "Vaseline") over the wound (unless you have an allergy to this). We recommend that you use a new, sterile tube of Vaseline. Do not pick or remove scabs. Do not remove the yellow or white "healing tissue" from the base of the wound.  Cover the wound with fresh, clean, nonstick gauze and secure with paper tape. You may use Band-Aids in place of gauze and tape if the wound is small enough, but would recommend trimming much of the tape off as there is often too much. Sometimes Band-Aids can irritate the skin.  You should call the office for your biopsy report after 1 week if you have not already been contacted.  If you experience any problems, such as abnormal amounts of bleeding, swelling, significant bruising, significant pain, or evidence of infection, please call the office immediately.  FOR ADULT SURGERY PATIENTS: If you need something for pain relief you may take 1 extra strength Tylenol (acetaminophen) AND 2 Ibuprofen ('200mg'$  each) together every 4 hours as needed for pain. (do not take these if you are allergic to them or if you have a reason you should not take them.) Typically, you may only need pain medication for 1 to 3 days.      Recommend daily broad spectrum sunscreen SPF 30+ to sun-exposed areas, reapply every 2 hours as needed. Call for new or changing lesions.   Staying in the shade or wearing long sleeves, sun glasses (UVA+UVB protection) and wide brim hats (4-inch brim around the entire circumference of the hat) are also recommended for sun protection.    Due to recent changes in healthcare laws, you may see results of your pathology and/or laboratory studies on MyChart before the doctors have had a chance to review them. We understand that in some cases there may be results that are confusing or concerning to you. Please understand that not all results are received at the same time and often the doctors may need to interpret multiple results in order to provide you with the best plan of care or course of treatment. Therefore, we ask that you please give [place] 2 business days to thoroughly review all your results before contacting the office for clarification. Should we see a critical lab result, you will be contacted sooner.   If You Need Anything After Your Visit  If you have any questions or concerns for your doctor, please call our main line at [phone] and press option 4 to reach your doctor's medical assistant. If no one answers, please leave a voicemail as directed and we will return your call as soon as possible. Messages left after 4 pm will be answered the following business day.   You may also send [place] a message via [place]. We typically respond to MyChart messages within 1-2 business days.  For prescription refills, please ask your pharmacy to contact our office.  Our fax number is [phone].  If you have an urgent issue when the clinic is closed that cannot wait until the next business day, you can page your doctor at the number below.    Please note that while we do our best to be available for urgent issues outside of office hours, we are not available 24/7.   If you have an urgent issue and are unable to reach [place], you may choose to seek medical care at your doctor's office, retail clinic, urgent care center, or emergency room.  If  you have a medical emergency, please immediately call 911 or go to the emergency department.  Pager Numbers  - Dr. Nehemiah Massed: [phone]  - Dr. Laurence Ferrari: [phone]  - Dr. Nicole Kindred: [phone]  In the event of inclement weather, please call our main line at [phone] for an update on the status of any delays or closures.  Dermatology Medication Tips: Please keep the boxes that topical medications come in in order to help keep track of the instructions about where and how to use these. Pharmacies typically print the medication instructions only on the boxes and not directly on the medication tubes.   If your medication is too expensive, please contact our office at [phone] option 4 or send [place] a message through [place].   We are unable to tell what your co-pay for medications will be in advance as this is different depending on your insurance coverage. However, we may be able to find a substitute medication at lower cost or fill out paperwork to get insurance to cover a needed medication.   If a prior authorization is required to get your medication covered by your insurance company, please allow [place] 1-2 business days to complete this process.  Drug prices often vary depending on where the prescription is filled and some pharmacies may offer cheaper prices.  The website www.goodrx.com contains coupons for medications through different pharmacies. The prices here do not account for what the cost may be with help from insurance (it may be cheaper with your insurance), but the website can give you the price if you did not use any insurance.  - You can print the associated coupon and take it with your prescription to the pharmacy.  - You may also stop by our office during regular business hours and pick up a GoodRx coupon card.  - If you need your prescription sent electronically to a different pharmacy, notify our office through [facility] or by phone at [phone] option  4.     Si Usted Necesita Algo Despus de Su Visita  Tambin puede enviarnos un mensaje a travs de Pharmacist, community. Por lo general respondemos a los mensajes de MyChart en el transcurso de 1 a 2 das hbiles.  Para renovar recetas, por favor pida a su farmacia que se ponga en contacto con nuestra oficina. Harland Dingwall de fax es [place] [phone].  Si tiene un asunto urgente cuando la clnica est cerrada y que no puede esperar hasta el siguiente da hbil, puede llamar/localizar a su doctor(a) al nmero que aparece a continuacin.   Por favor, tenga en cuenta que aunque hacemos todo lo posible para estar disponibles para asuntos urgentes fuera del horario de [place], no estamos disponibles las 24 horas del da, los 7 das de la [place].   Si tiene un problema urgente y no puede comunicarse con nosotros, puede optar por buscar atencin mdica  en el consultorio de su doctor(a), en una clnica privada, en un  centro de atencin urgente o en una sala de emergencias.  Si tiene Engineering geologist, por favor llame inmediatamente al 911 o vaya a la sala de emergencias.  Nmeros de bper  - Dr. Nehemiah Massed: [phone]  - Dra. Moye: [phone]  - Dra. Nicole Kindred: [phone]  En caso de inclemencias del [place], por favor llame a Johnsie Kindred principal al [phone] para una actualizacin sobre el [place] de cualquier retraso o cierre.  Consejos para la medicacin en dermatologa: Por favor, guarde las cajas en las que vienen los medicamentos de uso tpico para ayudarle a seguir las instrucciones sobre dnde y cmo usarlos. Las farmacias generalmente imprimen las instrucciones del medicamento slo en las cajas y no directamente en los tubos del [place].   Si su medicamento es muy caro, por favor, pngase en contacto con Zigmund Daniel llamando al [phone] y presione la opcin 4 o envenos un mensaje a travs de Pharmacist, community.   No podemos decirle cul ser su copago por los medicamentos por  adelantado ya que esto es diferente dependiendo de la cobertura de su seguro. Sin embargo, es posible que podamos encontrar un medicamento sustituto a Electrical engineer un formulario para que el seguro cubra el medicamento que se considera necesario.   Si se requiere una autorizacin previa para que su compaa de seguros [place] su medicamento, por favor permtanos de 1 a 2 das hbiles para completar este proceso.  Los precios de los medicamentos varan con frecuencia dependiendo del Environmental consultant de dnde se surte la receta y alguna farmacias pueden ofrecer precios ms baratos.  El sitio web www.goodrx.com tiene cupones para medicamentos de Airline pilot. Los precios aqu no tienen en cuenta lo que podra costar con la ayuda del seguro (puede ser ms barato con su seguro), pero el sitio web puede darle el precio si no utiliz Research scientist (physical sciences).  - Puede imprimir el cupn correspondiente y llevarlo con su receta a la farmacia.  - Tambin puede pasar por nuestra oficina durante el horario de atencin regular y Charity fundraiser una tarjeta de cupones de GoodRx.  - Si necesita que su receta se enve electrnicamente a una farmacia diferente, informe a nuestra oficina a travs de MyChart de [place] o por telfono llamando al [phone] y presione la opcin 4.

## 2023-01-20 ENCOUNTER — Telehealth: Payer: Self-pay

## 2023-01-20 NOTE — Telephone Encounter (Signed)
-----   Message from Alfonso Patten, MD sent at 01/20/2023  5:44 PM EST ----- Skin , right mid back VERRUCA VULGARIS   This is a WART caused by the human papilloma virus. It is not dangerous but is contagious and can spread to other areas of skin or other people if it is not completely gone. No additional treatment is needed. However, if it comes back, we can freeze it in clinic with liquid nitrogen (a quick in office procedure) or you can also treat it at home with an over the counter salicylic wart treatment (slower).  Please call the office at [phone] or message [place] if you have have any questions.   MAs please call. Thank you!

## 2023-01-20 NOTE — Telephone Encounter (Signed)
Advised pt's daughter of results.sh

## 2023-01-25 ENCOUNTER — Ambulatory Visit: Payer: Medicare HMO

## 2023-01-25 DIAGNOSIS — I255 Ischemic cardiomyopathy: Secondary | ICD-10-CM | POA: Diagnosis not present

## 2023-01-26 LAB — CUP PACEART REMOTE DEVICE CHECK
Battery Remaining Longevity: 34 mo
Battery Remaining Percentage: 46 %
Battery Voltage: 2.98 V
Brady Statistic AP VP Percent: 81 %
Brady Statistic AP VS Percent: 1 %
Brady Statistic AS VP Percent: 14 %
Brady Statistic AS VS Percent: 1 %
Brady Statistic RA Percent Paced: 79 %
Date Time Interrogation Session: 20240213020025
Implantable Lead Connection Status: 753985
Implantable Lead Connection Status: 753985
Implantable Lead Connection Status: 753985
Implantable Lead Implant Date: 20200810
Implantable Lead Implant Date: 20200810
Implantable Lead Implant Date: 20200810
Implantable Lead Location: 753858
Implantable Lead Location: 753859
Implantable Lead Location: 753860
Implantable Lead Model: 5076
Implantable Lead Model: 5076
Implantable Pulse Generator Implant Date: 20200810
Lead Channel Impedance Value: 390 Ohm
Lead Channel Impedance Value: 390 Ohm
Lead Channel Impedance Value: 540 Ohm
Lead Channel Pacing Threshold Amplitude: 0.75 V
Lead Channel Pacing Threshold Amplitude: 1 V
Lead Channel Pacing Threshold Amplitude: 1 V
Lead Channel Pacing Threshold Pulse Width: 0.4 ms
Lead Channel Pacing Threshold Pulse Width: 0.4 ms
Lead Channel Pacing Threshold Pulse Width: 1 ms
Lead Channel Sensing Intrinsic Amplitude: 1.4 mV
Lead Channel Sensing Intrinsic Amplitude: 12 mV
Lead Channel Setting Pacing Amplitude: 2 V
Lead Channel Setting Pacing Amplitude: 2 V
Lead Channel Setting Pacing Amplitude: 2 V
Lead Channel Setting Pacing Pulse Width: 0.4 ms
Lead Channel Setting Pacing Pulse Width: 1 ms
Lead Channel Setting Sensing Sensitivity: 2 mV
Pulse Gen Model: 3562
Pulse Gen Serial Number: 9130392

## 2023-01-28 ENCOUNTER — Encounter: Payer: Self-pay | Admitting: Cardiovascular Disease

## 2023-01-28 ENCOUNTER — Ambulatory Visit: Payer: Medicare HMO | Attending: Cardiovascular Disease | Admitting: Cardiovascular Disease

## 2023-01-28 VITALS — BP 100/60 | HR 70 | Ht 69.0 in | Wt 197.0 lb

## 2023-01-28 DIAGNOSIS — I5022 Chronic systolic (congestive) heart failure: Secondary | ICD-10-CM | POA: Diagnosis not present

## 2023-01-28 DIAGNOSIS — I739 Peripheral vascular disease, unspecified: Secondary | ICD-10-CM | POA: Diagnosis not present

## 2023-01-28 DIAGNOSIS — Z952 Presence of prosthetic heart valve: Secondary | ICD-10-CM

## 2023-01-28 DIAGNOSIS — I10 Essential (primary) hypertension: Secondary | ICD-10-CM | POA: Diagnosis not present

## 2023-01-28 DIAGNOSIS — E785 Hyperlipidemia, unspecified: Secondary | ICD-10-CM

## 2023-01-28 NOTE — Progress Notes (Signed)
Cardiology Office Note   Date:  01/28/2023   ID:  Matthew [name], DOB 1936-09-10, MRN [id number]  PCP:  Matthew Char, MD  Cardiologist:   Matthew Sacramento, MD   Chief Complaint  Patient presents with   OTHER]    6 month f/u. Meds reviewed verbally with pt.       History of Present Illness: Matthew [name] is a 87 y.o. male who presents for a follow-up visit regarding coronary artery disease , aortic stenosis, chronic systolic heart failure and peripheral arterial disease. He is status post CABG twice in 1991 and 2004. He is status post bilateral common iliac artery kissing stent placement in 2015. He is status post TAVR in June 2017. Cardiac catheterization before that showed patent grafts. He developed left bundle branch block post TAVR and this correlated with a gradual decline in his ejection fraction.   He has known history of frequent PVCs that improved with treatment.  He was hospitalized in October, 2019 with GI bleed and blood loss anemia.  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  His anemia improved with IV iron.  Echocardiogram in March of 2020 showed an EF of 25 to 30%, mild aortic stenosis with mean gradient of 17 mmHg and mild to moderate aortic regurgitation. The patient had biventricular pacemaker placement in August 2020 due to persistently low ejection fraction and left bundle branch block.    Most recent cardiac catheterization in September 2021 showed no significant change in coronary anatomy when compared to previous catheterization in 2019.  He was noted to have progression of native small vessel disease.  Right heart catheterization showed normal filling pressures, normal pulmonary pressure and normal cardiac output.  Most recent echocardiogram in January 2023 showed an EF of 40 to 45% with normal functioning TAVR prosthesis and mean gradient of 10 mmHg.  He had issues with progressive hypotension that necessitated decreasing the dose of  Entresto.  He has been doing reasonably well overall with rare intermittent episodes of chest pain.  He has occasional exertional dyspnea as well.  No lower extremity claudication.  Past Medical History:  Diagnosis Date   Alcohol abuse    stopped drinking 42 years ago from today 2020   Anemia    AS (aortic stenosis)    a. Echo 6/10: EF 55% mild AS; b. echo 06/2015; EF 55-60%, GR1DD, moderate AS, Peak velocity (S): 346 cm/s. Mean gradientS): 28 mm Hg. Peak gradient (S): 48 mm Hg. Valve area (VTI): 1.18 cm2;   c. Echo 4/17 - mild LVH, EF 55-60%, no RWMA, Gr 1 DD, mod to severe AS (mean 28 mmHg, peak 46 mmHg), mild LAE   CAD (coronary artery disease)    a. MI 1996 w/ CABG 1996; b.redo CABG 06/2003; c. Myoview 7/09: EF 54% inferobasal infarct, no ischemia. Myoview 6/10 EF 43% inf wall infarct. no ischemia; c. cath 8/16 s/p DES to VG-OM2. OTW 3VD w/ patent VG->RPDA, VG->OM3,  & LIMA->LAD.  EF 55-65%.   Carotid bruit    2009 0-39% on dopplers bilatrally   Chest pain    GERD (gastroesophageal reflux disease)    History of kidney stones    HOH (hard of hearing)    HTN (hypertension)    Hyperlipidemia    Myocardial infarction ([place]) 1996   Nephrolithiasis    OSA (obstructive sleep apnea)    PAD (peripheral artery disease) ([place]) 02/2014   Subtotal occlusion of right common iliac artery and 70%  stenosis in the left common iliac artery. Status post bilateral kissing stent placement. Significant post stenosis aneurysmal dilatation on the right side (any future catheterization through the right femoral artery should be done cautiously to avoid advancing the wire behind the stent struts)   Shortness of breath    with exertion   Squamous cell carcinoma of skin 12/17/2020   R angle of mandible - SCCIS, ED&C 01/22/2021   Squamous cell carcinoma of skin 12/17/2020   R upper arm SCCIS arising in [place], ED&C 01/22/2021   Squamous cell carcinoma of skin 12/17/2020   R chest - SCCIS, ED&C 01/22/2021    Thrombocytopenia ([place])     Past Surgical History:  Procedure Laterality Date   ABDOMINAL AORTAGRAM N/A 03/06/2014   Procedure: ABDOMINAL Maxcine Ham;  Surgeon: Wellington Hampshire, MD;  Location: [place] CATH LAB;  Service: Cardiovascular;  Laterality: N/A;   BIV PACEMAKER INSERTION CRT-P N/A 07/23/2019   Procedure: BIV PACEMAKER INSERTION CRT-P;  Surgeon: Deboraha Sprang, MD;  Location: [place] CV LAB;  Service: Cardiovascular;  Laterality: N/A;   CARDIAC CATHETERIZATION  02/2014   Severe three-vessel coronary artery disease with patent grafts.    CARDIAC CATHETERIZATION N/A 07/31/2015   Procedure: Right/Left Heart Cath and Coronary/Graft Angiography;  Surgeon: Wellington Hampshire, MD;  Location: [place] CV LAB;  Service: Cardiovascular;  Laterality: N/A;   CARDIAC CATHETERIZATION N/A 07/31/2015   Procedure: Coronary Stent Intervention;  Surgeon: Wellington Hampshire, MD;  Location: [place] CV LAB;  Service: Cardiovascular;  Laterality: N/A;   CARDIAC CATHETERIZATION N/A 05/26/2016   Procedure: Right/Left Heart Cath and Coronary Angiography;  Surgeon: Wellington Hampshire, MD;  Location: [place] CV LAB;  Service: Cardiovascular;  Laterality: N/A;   COLONOSCOPY WITH PROPOFOL N/A 10/07/2018   Procedure: COLONOSCOPY WITH PROPOFOL;  Surgeon: Lin Landsman, MD;  Location[facility] ENDOSCOPY;  Service: Gastroenterology;  Laterality: N/A;   COLONOSCOPY WITH PROPOFOL N/A 01/08/2019   Procedure: COLONOSCOPY WITH PROPOFOL;  Surgeon: Lin Landsman, MD;  Location: [facility] ENDOSCOPY;  Service: Gastroenterology;  Laterality: N/A;   CORONARY ARTERY BYPASS GRAFT  1991   at [facility] Redone 2004-3 vessels 1st time and 4 second time   CYSTO     2/3 times for kidney stones   ENTEROSCOPY N/A 12/25/2018   Procedure: ENTEROSCOPY Single balloon;  Surgeon: Lin Landsman, MD;  Location: [place];  Service: Gastroenterology;  Laterality: N/A;   ESOPHAGOGASTRODUODENOSCOPY (EGD) WITH PROPOFOL N/A 10/07/2018    Procedure: ESOPHAGOGASTRODUODENOSCOPY (EGD) WITH PROPOFOL;  Surgeon: Lin Landsman, MD;  Location: [facility] ENDOSCOPY;  Service: Gastroenterology;  Laterality: N/A;   GIVENS CAPSULE STUDY N/A 12/22/2018   Procedure: GIVENS CAPSULE STUDY;  Surgeon: Lin Landsman, MD;  Location: [facility] ENDOSCOPY;  Service: Gastroenterology;  Laterality: N/A;   INGUINAL HERNIA REPAIR  11/10/2011   Procedure: HERNIA REPAIR INGUINAL ADULT;  Surgeon: Donato Heinz;  Location: [facility] ORS;  Service: General;  Laterality: Right;   LAPAROSCOPIC RIGHT COLECTOMY Right 02/09/2019   Procedure: LAPAROSCOPIC RIGHT COLECTOMY;  Surgeon: Jules Husbands, MD;  Location: [facility] ORS;  Service: General;  Laterality: Right;   LEFT HEART CATH AND CORONARY ANGIOGRAPHY Left 05/15/2018   Procedure: LEFT HEART CATH AND CORONARY ANGIOGRAPHY;  Surgeon: Wellington Hampshire, MD;  Location: [place] CV LAB;  Service: Cardiovascular;  Laterality: Left;   LEFT HEART CATHETERIZATION WITH CORONARY/GRAFT ANGIOGRAM N/A 03/06/2014   Procedure: LEFT HEART CATHETERIZATION WITH Beatrix Fetters;  Surgeon: Wellington Hampshire, MD;  Location: [place] CATH LAB;  Service: Cardiovascular;  Laterality: N/A;   RIGHT HEART CATH AND CORONARY/GRAFT ANGIOGRAPHY N/A 09/05/2020   Procedure: RIGHT HEART CATH AND CORONARY/GRAFT ANGIOGRAPHY;  Surgeon: Wellington Hampshire, MD;  Location: [place] CV LAB;  Service: Cardiovascular;  Laterality: N/A;   STOMACH SURGERY     removal of gastric ulcers   TEE WITHOUT CARDIOVERSION N/A 06/01/2016   Procedure: TRANSESOPHAGEAL ECHOCARDIOGRAM (TEE);  Surgeon: Sherren Mocha, MD;  Location: [place];  Service: Open Heart Surgery;  Laterality: N/A;   TRANSCATHETER AORTIC VALVE REPLACEMENT, TRANSFEMORAL N/A 06/01/2016   Procedure: TRANSCATHETER AORTIC VALVE REPLACEMENT, TRANSFEMORAL;  Surgeon: Sherren Mocha, MD;  Location: [place];  Service: Open Heart Surgery;  Laterality: N/A;     Current Outpatient Medications  Medication Sig Dispense  Refill   acetaminophen (TYLENOL) 325 MG tablet Take 650 mg by mouth every 6 (six) hours as needed for mild pain or headache.      aspirin EC 81 MG tablet Take 1 tablet (81 mg total) by mouth daily. 90 tablet 3   atorvastatin (LIPITOR) 80 MG tablet Take 1 tablet (80 mg total) by mouth daily. 90 tablet 3   carvedilol (COREG) 3.125 MG tablet TAKE 1 TABLET BY MOUTH TWICE  DAILY 180 tablet 3   clopidogrel (PLAVIX) 75 MG tablet clopidogrel 75 mg tablet     ezetimibe (ZETIA) 10 MG tablet Take 1 tablet (10 mg total) by mouth daily. 90 tablet 3   isosorbide mononitrate (IMDUR) 60 MG 24 hr tablet TAKE 1 TABLET BY MOUTH DAILY 90 tablet 3   Multiple Vitamins-Minerals (PRESERVISION AREDS PO) Take 1 tablet by mouth daily.      mupirocin ointment (BACTROBAN) 2 % Apply 1 Application topically daily. 22 g 1   niacinamide 500 MG tablet Take 500 mg by mouth 2 (two) times daily with a meal.     pantoprazole (PROTONIX) 40 MG tablet TAKE 1 TABLET BY MOUTH DAILY 90 tablet 0   Polypodium Leucotomos (HELIOCARE PO) Take by mouth.     sacubitril-valsartan (ENTRESTO) 24-26 MG Take 1 tablet by mouth 2 (two) times daily. 180 tablet 0   spironolactone (ALDACTONE) 25 MG tablet Take 0.5 tablets (12.5 mg total) by mouth daily. 45 tablet 0   tamsulosin (FLOMAX) 0.4 MG CAPS capsule Take 1 capsule (0.4 mg total) by mouth daily. Future refill request will need to be sent to the pcp 90 capsule 1   tetrahydrozoline 0.05 % ophthalmic solution Place 2 drops into both eyes 4 (four) times daily as needed (for dry eyes).     TURMERIC PO Take 1 capsule by mouth at bedtime.      vitamin B-12 (CYANOCOBALAMIN) 1000 MCG tablet Take 1,000 mcg by mouth daily.     No current facility-administered medications for this visit.   Facility-Administered Medications Ordered in Other Visits  Medication Dose Route Frequency Provider Last Rate Last Admin   cyanocobalamin ((VITAMIN B-12)) injection 1,000 mcg  1,000 mcg Intramuscular Weekly Sindy Guadeloupe,  MD   1,000 mcg at 02/25/20 1447    Allergies:   Ranitidine and Ranexa [ranolazine er]    Social History:  The patient  reports that he quit smoking about 33 years ago. His smoking use included cigarettes. He has a 50.00 pack-year smoking history. He has never used smokeless tobacco. He reports that he does not drink alcohol and does not use drugs.   Family History:  The patient's family history includes Heart disease in his father and mother.    ROS:  Please see  the history of present illness.   Otherwise, review of systems are positive for none.   All other systems are reviewed and negative.    PHYSICAL EXAM: VS:  BP 100/60 (BP Location: Left Arm, Patient Position: Sitting, Cuff Size: Normal)   Pulse 70   Ht 5' 9"$  (1.753 m)   Wt 197 lb (89.4 kg)   SpO2 93%   BMI 29.09 kg/m  , BMI Body mass index is 29.09 kg/m. GEN: Well nourished, well developed, in no acute distress  HEENT: normal  Neck: no JVD, carotid bruits, or masses Cardiac: RRR; no rubs, or gallops,no edema .   2/6 crescendo decrescendo aortic murmur which is early peaking. Respiratory:  clear to auscultation bilaterally, normal work of breathing GI: soft, nontender, nondistended, + BS MS: no deformity or atrophy  Skin: warm and dry, no rash Neuro:  Strength and sensation are intact Psych: euthymic mood, full affect   EKG:  EKG is ordered today. The ekg ordered today demonstrates AV sequential pacemaker.   Recent Labs: 03/25/2022: BUN 17; Creatinine, Ser 1.13; Hemoglobin 14.7; Platelets 114; Potassium 3.9; Sodium 136    Lipid Panel    Component Value Date/Time   CHOL 144 01/07/2022 0754   TRIG 120 01/07/2022 0754   HDL 56 01/07/2022 0754   CHOLHDL 2.6 01/07/2022 0754   CHOLHDL 3.0 12/18/2020 1635   VLDL 23 12/18/2020 1635   LDLCALC 67 01/07/2022 0754   LDLDIRECT 83.3 12/18/2020 1635      Wt Readings from Last 3 Encounters:  01/28/23 197 lb (89.4 kg)  06/29/22 195 lb 9.6 oz (88.7 kg)  06/29/22 200 lb  (90.7 kg)        ASSESSMENT AND PLAN:  1.  S/p TAVR  for severe symptomatic aortic stenosis: Most recent echocardiogram  showed normal functioning prosthesis with mean gradient of 10 mmHg.      2. Coronary artery disease involving graft bypass with stable angina: Symptoms are well controlled with current medications.  Cardiac catheterization in 2021 showed stable coronary anatomy.    3.  Chronic systolic heart failure: Most recent ejection fraction improved to 40 to 45%.  Progressive hypotension improved after decreasing the dose of Entresto.  His blood pressure seems to be stable on current medications.  He appears to be euvolemic.  4. Essential hypertension: Blood pressure is controlled.  5. Peripheral arterial disease: No recurrent claudication since bilateral iliac stenting.   6. Hyperlipidemia: Previous LDL was 83 and since then Zetia was added.   The patient is going to establish with Dr. Doy Hutching in the near future and will get routine labs there.    Disposition:   FU with me in 6 months  Signed,  Matthew Sacramento, MD  01/28/2023 9:50 AM    [place]

## 2023-01-28 NOTE — Patient Instructions (Signed)
Medication Instructions:  No changes *If you need a refill on your cardiac medications before your next appointment, please call your pharmacy*   Lab Work: None ordered If you have labs (blood work) drawn today and your tests are completely normal, you will receive your results only by: [place] (if you have MyChart) OR A paper copy in the mail If you have any lab test that is abnormal or we need to change your treatment, we will call you to review the results.   Testing/Procedures: None ordered   Follow-Up: At [facility], you and your health needs are our priority.  As part of our continuing mission to provide you with exceptional heart care, we have created designated Provider Care Teams.  These Care Teams include your primary Cardiologist (physician) and Advanced Practice Providers (APPs -  Physician Assistants and Nurse Practitioners) who all work together to provide you with the care you need, when you need it.  We recommend signing up for the patient portal called "MyChart".  Sign up information is provided on this After Visit Summary.  MyChart is used to connect with patients for Virtual Visits (Telemedicine).  Patients are able to view lab/test results, encounter notes, upcoming appointments, etc.  Non-urgent messages can be sent to your provider as well.   To learn more about what you can do with MyChart, go to [email or web address].    Your next appointment:   6 month(s)  Provider:   You may see [name], MD or one of the following Advanced Practice Providers on your designated Care Team:   Murray Hodgkins, NP Christell Faith, PA-C Cadence Kathlen Mody, PA-C Gerrie Nordmann, NP

## 2023-02-11 ENCOUNTER — Encounter: Payer: Self-pay | Admitting: Cardiovascular Disease

## 2023-02-11 MED ORDER — PANTOPRAZOLE SODIUM 40 MG PO TBEC: 40.0000 mg | DELAYED_RELEASE_TABLET | Freq: Every day | ORAL | 0 refills | Status: DC

## 2023-02-11 MED ORDER — PANTOPRAZOLE SODIUM 40 MG PO TBEC: 40.0000 mg | DELAYED_RELEASE_TABLET | Freq: Every day | ORAL | 3 refills | Status: DC

## 2023-02-11 NOTE — Addendum Note (Signed)
Addended by: Meryl Crutch on: 02/11/2023 03:06 PM   Modules accepted: Orders

## 2023-02-21 ENCOUNTER — Ambulatory Visit: Payer: Medicare HMO | Attending: Pain Medicine | Admitting: Pain Medicine

## 2023-02-21 ENCOUNTER — Encounter: Payer: Self-pay | Admitting: Pain Medicine

## 2023-02-21 VITALS — BP 126/60 | HR 68 | Temp 97.2°F | Ht 69.0 in | Wt 196.0 lb

## 2023-02-21 DIAGNOSIS — M25551 Pain in right hip: Secondary | ICD-10-CM | POA: Diagnosis not present

## 2023-02-21 DIAGNOSIS — G8929 Other chronic pain: Secondary | ICD-10-CM | POA: Insufficient documentation

## 2023-02-21 DIAGNOSIS — M1611 Unilateral primary osteoarthritis, right hip: Secondary | ICD-10-CM | POA: Diagnosis not present

## 2023-02-21 DIAGNOSIS — M47816 Spondylosis without myelopathy or radiculopathy, lumbar region: Secondary | ICD-10-CM | POA: Diagnosis not present

## 2023-02-21 DIAGNOSIS — Z7901 Long term (current) use of anticoagulants: Secondary | ICD-10-CM | POA: Diagnosis not present

## 2023-02-21 DIAGNOSIS — M47817 Spondylosis without myelopathy or radiculopathy, lumbosacral region: Secondary | ICD-10-CM | POA: Insufficient documentation

## 2023-02-21 DIAGNOSIS — M545 Low back pain, unspecified: Secondary | ICD-10-CM | POA: Diagnosis not present

## 2023-02-21 DIAGNOSIS — H353221 Exudative age-related macular degeneration, left eye, with active choroidal neovascularization: Secondary | ICD-10-CM | POA: Diagnosis not present

## 2023-02-21 DIAGNOSIS — Z961 Presence of intraocular lens: Secondary | ICD-10-CM | POA: Diagnosis not present

## 2023-02-21 NOTE — Progress Notes (Signed)
PROVIDER NOTE: Information contained herein reflects review and annotations entered in association with encounter. Interpretation of such information and data should be left to medically-trained personnel. Information provided to patient can be located elsewhere in the medical record under "Patient Instructions". Document created using STT-dictation technology, any transcriptional errors that may result from process are unintentional.    Patient: Matthew [name]  Service Category: E/M  Provider: Gaspar Cola, MD  DOB: 10-23-1936  DOS: 02/21/2023  Referring Provider: Antionette Char, MD  MRN: [id number]  Specialty: Interventional Pain Management  PCP: Matthew Char, MD  Type: Established Patient  Setting: Ambulatory outpatient    Location: Office  Delivery: Face-to-face     HPI  [name], a 87 y.o. year old male, is here today because of his Chronic bilateral low back pain without sciatica [M54.50, G89.29]. Matthew [name] primary complain today is Back Pain (Right low), Hip Pain (right), and Leg Pain (right)  Pertinent problems: Matthew [name] has Chronic low back pain (1ry area of Pain) (Bilateral) (L>R) w/ sciatica (Bilateral); Chronic lower extremity pain (2ry area of Pain) (Bilateral) (L>R); Chronic pain syndrome; Chronic sacroiliac joint pain (Bilateral) (L>R); Chronic hip pain (Right); Neurogenic pain; Lumbar facet syndrome (Bilateral) (L>R); DDD (degenerative disc disease), lumbar; Lumbar facet osteoarthritis; Lumbar spondylosis with radiculopathy; Chronic lower extremity radicular pain (S1 dermatome) (Bilateral) (L>R); Lumbar spinal stenosis; Abnormal MRI, lumbar spine (12/09/2017); Chronic lumbar radiculopathy; Spondylosis without myelopathy or radiculopathy, lumbosacral region; Other spondylosis, sacral and sacrococcygeal region; Chronic low back pain (Bilateral) w/o sciatica; and Osteoarthritis of hip (Right) on their pertinent problem list. Pain Assessment: Severity of Chronic pain is  reported as a 7 /10. Location: Back Right/radiates into right hip and right leg. Onset: More than a month ago. Quality: Throbbing. Timing: Intermittent. Modifying factor(s): rest. Vitals:  height is '5\' 9"'$  (1.753 m) and weight is 196 lb (88.9 kg). His temperature is 97.2 F (36.2 C) (abnormal). His blood pressure is 126/60 and his pulse is 68.  BMI: Estimated body mass index is 28.94 kg/m as calculated from the following:   Height as of this encounter: '5\' 9"'$  (1.753 m).   Weight as of this encounter: 196 lb (88.9 kg). Last encounter: 06/29/2022. Last procedure: 06/10/2022.  Reason for encounter: evaluation of worsening, or previously known (established) problem.  The patient indicates that last week the pain started to get pretty bad and by Friday he was unable to walk.  He comes in today requesting that we repeat the treatment for the lower back.  He describes the pain today to be only on the right side.  He has got decreased range of motion of the lumbar spine with reproduction of the pain on attempted hyperextension and rotation maneuver towards the right side which trigger pain ipsilaterally.  His hyperextension on rotation maneuver was concordant with the results of the [facility] maneuver.  At this point, I believe the pain to be coming from his lumbar facet joints.  Pain is going down the lower back, buttocks, lateral aspect of the upper thigh down to the top of the lateral knee.  No pain, numbness or weakness below the knee.  The patient currently does not appear to be having any radicular components to this pain.  In addition, the patient presented with pain in the right hip area compatible with prior pain from his right hip osteoarthritis.  Pharmacotherapy Assessment  Analgesic: Tramadol 50 mg 1 tablet by mouth every 8 hours (150 mg/day of tramadol) (15 MME/day)  MME/day: 15 mg/day.   Monitoring: [place] PMP: PDMP reviewed during this encounter.       Pharmacotherapy: No side-effects or adverse reactions  reported. Compliance: No problems identified. Effectiveness: Clinically acceptable.  Matthew Shorter, RN  02/21/2023  8:42 AM  Sign when Signing Visit Safety precautions to be maintained throughout the outpatient stay will include: orient to surroundings, keep bed in low position, maintain call bell within reach at all times, provide assistance with transfer out of bed and ambulation.    No results found for: "CBDTHCR" No results found for: "D8THCCBX" No results found for: "D9THCCBX"  UDS:  Summary  Date Value Ref Range Status  08/31/2017 FINAL  Final    Comment:    ==================================================================== TOX[facility]RE COMP DRUG ANALYSIS,UR ==================================================================== Test                             Result       Flag       Units Drug Present and Declared for Prescription Verification   Acetaminophen                  PRESENT      EXPECTED Drug Present not Declared for Prescription Verification   Tramadol                       >5102        UNEXPECTED ng/mg creat   O-Desmethyltramadol            5066         UNEXPECTED ng/mg creat   N-Desmethyltramadol            1382         UNEXPECTED ng/mg creat    Source of tramadol is a prescription medication.    O-desmethyltramadol and N-desmethyltramadol are expected    metabolites of tramadol. Drug Absent but Declared for Prescription Verification   Salicylate                     Not Detected UNEXPECTED    Aspirin, as indicated in the declared medication list, is not    always detected even when used as directed.   Metoprolol                     Not Detected UNEXPECTED ==================================================================== Test                      Result    Flag   Units      Ref Range   Creatinine              98               mg/dL      >=20 ==================================================================== Declared Medications:  The flagging and  interpretation on this report are based on the  following declared medications.  Unexpected results may arise from  inaccuracies in the declared medications.  **Note: The testing scope of this panel includes these medications:  Metoprolol  **Note: The testing scope of this panel does not include small to  moderate amounts of these reported medications:  Acetaminophen  Aspirin  **Note: The testing scope of this panel does not include following  reported medications:  Atorvastatin  Clopidogrel  Hydrochlorothiazide (HCTZ)  Isosorbide Mononitrate  Lisinopril  Multivitamin  Nitroglycerin  Pantoprazole  Tamsulosin ==================================================================== For clinical consultation, please call [phone]. ====================================================================  ROS  Constitutional: Denies any fever or chills Gastrointestinal: No reported hemesis, hematochezia, vomiting, or acute GI distress Musculoskeletal: Denies any acute onset joint swelling, redness, loss of ROM, or weakness Neurological: No reported episodes of acute onset apraxia, aphasia, dysarthria, agnosia, amnesia, paralysis, loss of coordination, or loss of consciousness  Medication Review  Multiple Vitamins-Minerals, Polypodium Leucotomos, Turmeric, acetaminophen, aspirin EC, atorvastatin, carvedilol, clopidogrel, cyanocobalamin, ezetimibe, isosorbide mononitrate, mupirocin ointment, niacinamide, pantoprazole, sacubitril-valsartan, spironolactone, tamsulosin, and tetrahydrozoline  History Review  Allergy: Matthew [name] is allergic to ranitidine and ranexa [ranolazine er]. Drug: Matthew [name]  reports no history of drug use. Alcohol:  reports no history of alcohol use. Tobacco:  reports that he quit smoking about 33 years ago. His smoking use included cigarettes. He has a 50.00 pack-year smoking history. He has never used smokeless tobacco. Social: Matthew [name]  reports that he quit  smoking about 33 years ago. His smoking use included cigarettes. He has a 50.00 pack-year smoking history. He has never used smokeless tobacco. He reports that he does not drink alcohol and does not use drugs. Medical:  has a past medical history of Alcohol abuse, Anemia, AS (aortic stenosis), CAD (coronary artery disease), Carotid bruit, Chest pain, GERD (gastroesophageal reflux disease), History of kidney stones, HOH (hard of hearing), HTN (hypertension), Hyperlipidemia, Myocardial infarction ([place]) (1996), Nephrolithiasis, OSA (obstructive sleep apnea), PAD (peripheral artery disease) ([place]) (02/2014), Shortness of breath, Squamous cell carcinoma of skin (12/17/2020), Squamous cell carcinoma of skin (12/17/2020), Squamous cell carcinoma of skin (12/17/2020), and Thrombocytopenia ([place]). Surgical: Matthew [name]  has a past surgical history that includes Stomach surgery; Coronary artery bypass graft (1991); Inguinal hernia repair (11/10/2011); left heart catheterization with coronary/graft angiogram (N/A, 03/06/2014); abdominal aortagram (N/A, 03/06/2014); Cardiac catheterization (02/2014); Cardiac catheterization (N/A, 07/31/2015); Cardiac catheterization (N/A, 07/31/2015); Cardiac catheterization (N/A, 05/26/2016); Cysto; Transcatheter aortic valve replacement, transfemoral (N/A, 06/01/2016); TEE without cardioversion (N/A, 06/01/2016); LEFT HEART CATH AND CORONARY ANGIOGRAPHY (Left, 05/15/2018); Esophagogastroduodenoscopy (egd) with propofol (N/A, 10/07/2018); Colonoscopy with propofol (N/A, 10/07/2018); Givens capsule study (N/A, 12/22/2018); enteroscopy (N/A, 12/25/2018); Colonoscopy with propofol (N/A, 01/08/2019); Laparoscopic right colectomy (Right, 02/09/2019); BIV PACEMAKER INSERTION CRT-P (N/A, 07/23/2019); and RIGHT HEART CATH AND CORONARY/GRAFT ANGIOGRAPHY (N/A, 09/05/2020). Family: family history includes Heart disease in his father and mother.  Laboratory Chemistry Profile   Renal Lab Results  Component Value Date    BUN 17 03/25/2022   CREATININE 1.13 03/25/2022   BCR 9 (L) 01/07/2022   GFRAA >60 09/03/2020   GFRNONAA >60 03/25/2022    Hepatic Lab Results  Component Value Date   AST 25 01/07/2022   ALT 45 (H) 01/07/2022   ALBUMIN 4.5 01/07/2022   ALKPHOS 94 01/07/2022    Electrolytes Lab Results  Component Value Date   NA 136 03/25/2022   K 3.9 03/25/2022   CL 106 03/25/2022   CALCIUM 9.4 03/25/2022   MG 2.0 02/10/2019   PHOS 1.7 (L) 02/10/2019    Bone Lab Results  Component Value Date   25OHVITD1 31 08/31/2017   25OHVITD2 <1.0 08/31/2017   25OHVITD3 31 08/31/2017    Inflammation (CRP: Acute Phase) (ESR: Chronic Phase) Lab Results  Component Value Date   CRP 0.4 08/31/2017   ESRSEDRATE 3 08/31/2017         Note: Above Lab results reviewed.  Recent Imaging Review  CUP PACEART REMOTE DEVICE CHECK Scheduled remote reviewed. Normal device function.   7 AMS, 2-6sec in duration Next remote 91 days. LA Note: Reviewed  Physical Exam  General appearance: Well nourished, well developed, and well hydrated. In no apparent acute distress Mental status: Alert, oriented x 3 (person, place, & time)       Respiratory: No evidence of acute respiratory distress Eyes: PERLA Vitals: BP 126/60   Pulse 68   Temp (!) 97.2 F (36.2 C)   Ht '5\' 9"'$  (1.753 m)   Wt 196 lb (88.9 kg)   BMI 28.94 kg/m  BMI: Estimated body mass index is 28.94 kg/m as calculated from the following:   Height as of this encounter: '5\' 9"'$  (1.753 m).   Weight as of this encounter: 196 lb (88.9 kg). Ideal: Ideal body weight: 70.7 kg (155 lb 13.8 oz) Adjusted ideal body weight: 78 kg (171 lb 14.7 oz)  Assessment   Diagnosis Status  1. Chronic low back pain (Bilateral) w/o sciatica   2. Lumbar facet syndrome (Bilateral) (L>R)   3. Lumbar facet osteoarthritis   4. Spondylosis without myelopathy or radiculopathy, lumbosacral region   5. Chronic anticoagulation (Plavix)   6. Chronic hip pain (Right)   7.  Osteoarthritis of hip (Right)    Recurring Recurring Recurring   Updated Problems: No problems updated.  Plan of Care  Problem-specific:  No problem-specific Assessment & Plan notes found for this encounter.  Matthew [name] has a current medication list which includes the following long-term medication(s): atorvastatin, carvedilol, ezetimibe, isosorbide mononitrate, pantoprazole, and spironolactone.  Pharmacotherapy (Medications Ordered): No orders of the defined types were placed in this encounter.  Orders:  Orders Placed This Encounter  Procedures   LUMBAR FACET(MEDIAL BRANCH NERVE BLOCK) MBNB    Diagnosis: Lumbar Facet Syndrome (M47.816); Lumbosacral Facet Syndrome (M47.817); Lumbar Facet Joint Pain (M54.59) Medical Necessity Statement: 1.Severe chronic axial low back pain causing functional impairment documented by ongoing pain scale assessments. 2.Pain present for longer than 3 months (Chronic) documented to have failed noninvasive conservative therapies. 3.Absence of untreated radiculopathy. 4.There is no radiological evidence of untreated fractures, tumor, infection, or deformity.  Physical Examination Findings: Positive Kemp Maneuver: (Y)  Positive Lumbar Hyperextension-Rotation provocative test: (Y)    Standing Status:   Future    Standing Expiration Date:   05/24/2023    Scheduling Instructions:     Procedure: Lumbar facet Medial Branch Block (MBB)     Side: Bilateral     Level: L3-4, L4-5, and L5-S1 Facets (L2, L3, L4, L5, and S1 Medial Branch)     Sedation: With Sedation.     Timeframe: ASAP    Order Specific Question:   Where will this procedure be performed?    Answer:   [facility]   HIP INJECTION    Standing Status:   Future    Standing Expiration Date:   05/24/2023    Scheduling Instructions:     Side: Right-sided     Sedation: Patient's choice.     Timeframe: ASAP   Blood Thinner Instructions to Nursing    If unable to stop, ask if  Lovenox-bridge therapy may be possible, and if so, request their assistance in implementing it.    Scheduling Instructions:     Contact the physician prescribing the blood thinner and request clearance to stop it for time period stipulated below.     If approved by prescribing physician, stop Plavix (Clopidogrel) x 7-10 days prior to procedure or surgery.   Follow-up plan:   Return for [facility]): (R) L-FCT Blk #4 + (R) IA Hip inj. #2 , (Blood Thinner Protocol).  Interventional Therapies  Risk  Complexity Considerations:   PLAVIX ANTICOAGULATION: (Stop: 7-10 days  Restart: 2 hours)    Planned  Pending:   Therapeutic right lumbar facet MBB #4 + right IA hip inj. #2    Under consideration:   Therapeutic bilateral lumbar facet MBB #4 + bilateral SI joint Blk #4 (last done 07/24/2020)  Possible bilateral lumbar facet RFA #1 Possible bilateral SI joint RFA #1  Diagnostic left L2 TFESI  Diagnostic bilateral L3 TFESI  Diagnostic bilateral hip injections    Completed:   Diagnostic/therapeutic right IA hip joint inj. x1 (06/10/2022) (100/100/75/100)  Diagnostic bilateral lumbar facet MBB x3 (07/24/2020) (100/100/75/75)  Diagnostic bilateral SI joint Blk x3 (07/24/2020) (100/100/75/75)  Therapeutic/palliative left L4-5 LESI x2 (03/09/2018) (100/100/85/75-100)    Therapeutic  Palliative (PRN) options:   Therapeutic lumbar facet MBB   Therapeutic SI joint Blk   Therapeutic/palliative left L4-5 LESI #3         Recent Visits No visits were found meeting these conditions. Showing recent visits within past 90 days and meeting all other requirements Today's Visits Date Type Provider Dept  02/21/23 Office Visit Milinda Pointer, MD [facility]  Showing today's visits and meeting all other requirements Future Appointments No visits were found meeting these conditions. Showing future appointments within next 90 days and meeting all other requirements  I discussed the  assessment and treatment plan with the patient. The patient was provided an opportunity to ask questions and all were answered. The patient agreed with the plan and demonstrated an understanding of the instructions.  Patient advised to call back or seek an in-person evaluation if the symptoms or condition worsens.  Duration of encounter: 30 minutes.  Total time on encounter, as per AMA guidelines included both the face-to-face and non-face-to-face time personally spent by the physician and/or other qualified health care professional(s) on the day of the encounter (includes time in activities that require the physician or other qualified health care professional and does not include time in activities normally performed by clinical staff). Physician's time may include the following activities when performed: Preparing to see the patient (e.g., pre-charting review of records, searching for previously ordered imaging, lab work, and nerve conduction tests) Review of prior analgesic pharmacotherapies. Reviewing PMP Interpreting ordered tests (e.g., lab work, imaging, nerve conduction tests) Performing post-procedure evaluations, including interpretation of diagnostic procedures Obtaining and/or reviewing separately obtained history Performing a medically appropriate examination and/or evaluation Counseling and educating the patient/family/caregiver Ordering medications, tests, or procedures Referring and communicating with other health care professionals (when not separately reported) Documenting clinical information in the electronic or other health record Independently interpreting results (not separately reported) and communicating results to the patient/ family/caregiver Care coordination (not separately reported)  Note by: Matthew Cola, MD Date: 02/21/2023; Time: 9:12 AM

## 2023-02-21 NOTE — Patient Instructions (Signed)
______________________________________________________________________  Procedure instructions  Do not eat or drink fluids (other than water) for 6 hours before your procedure  No water for 2 hours before your procedure  Take your blood pressure medicine with a sip of water  Arrive 30 minutes before your appointment  Carefully read the "Preparing for your procedure" detailed instructions  If you have questions call us at [phone]  _____________________________________________________________________    ______________________________________________________________________  Preparing for your procedure  Appointments: If you think you may not be able to keep your appointment, call 24-48 hours in advance to cancel. We need time to make it available to others.  During your procedure appointment there will be: No Prescription Refills. No disability issues to discussed. No medication changes or discussions.  Instructions: Food intake: Avoid eating anything solid for at least 8 hours prior to your procedure. Clear liquid intake: You may take clear liquids such as water up to 2 hours prior to your procedure. (No carbonated drinks. No soda.) Transportation: Unless otherwise stated by your physician, bring a driver. Morning Medicines: Except for blood thinners, take all of your other morning medications with a sip of water. Make sure to take your heart and blood pressure medicines. If your blood pressure's lower number is above 100, the case will be rescheduled. Blood thinners: Make sure to stop your blood thinners as instructed.  If you take a blood thinner, but were not instructed to stop it, call our office [phone] and ask to talk to a nurse. Not stopping a blood thinner prior to certain procedures could lead to serious complications. Diabetics on insulin: Notify the staff so that you can be scheduled 1st case in the morning. If your diabetes requires high dose insulin,  take only  of your normal insulin dose the morning of the procedure and notify the staff that you have done so. Preventing infections: Shower with an antibacterial soap the morning of your procedure.  Build-up your immune system: Take 1000 mg of Vitamin C with every meal (3 times a day) the day prior to your procedure. Antibiotics: Inform the nursing staff if you are taking any antibiotics or if you have any conditions that may require antibiotics prior to procedures. (Example: recent joint implants)   Pregnancy: If you are pregnant make sure to notify the nursing staff. Not doing so may result in injury to the fetus, including death.  Sickness: If you have a cold, fever, or any active infections, call and cancel or reschedule your procedure. Receiving steroids while having an infection may result in complications. Arrival: You must be in the facility at least 30 minutes prior to your scheduled procedure. Tardiness: Your scheduled time is also the cutoff time. If you do not arrive at least 15 minutes prior to your procedure, you will be rescheduled.  Children: Do not bring any children with you. Make arrangements to keep them home. Dress appropriately: There is always a possibility that your clothing may get soiled. Avoid long dresses. Valuables: Do not bring any jewelry or valuables.  Reasons to call and reschedule or cancel your procedure: (Following these recommendations will minimize the risk of a serious complication.) Surgeries: Avoid having procedures within 2 weeks of any surgery. (Avoid for 2 weeks before or after any surgery). Flu Shots: Avoid having procedures within 2 weeks of a flu shots or . (Avoid for 2 weeks before or after immunizations). Barium: Avoid having a procedure within 7-10 days after having had a radiological study involving the use of   radiological contrast. (Myelograms, Barium swallow or enema study). Heart attacks: Avoid any elective procedures or surgeries for the  initial 6 months after a "Myocardial Infarction" (Heart Attack). Blood thinners: It is imperative that you stop these medications before procedures. Let us know if you if you take any blood thinner.  Infection: Avoid procedures during or within two weeks of an infection (including chest colds or gastrointestinal problems). Symptoms associated with infections include: Localized redness, fever, chills, night sweats or profuse sweating, burning sensation when voiding, cough, congestion, stuffiness, runny nose, sore throat, diarrhea, nausea, vomiting, cold or Flu symptoms, recent or current infections. It is specially important if the infection is over the area that we intend to treat. Heart and lung problems: Symptoms that may suggest an active cardiopulmonary problem include: cough, chest pain, breathing difficulties or shortness of breath, dizziness, ankle swelling, uncontrolled high or unusually low blood pressure, and/or palpitations. If you are experiencing any of these symptoms, cancel your procedure and contact your primary care physician for an evaluation.  Remember:  Regular Business hours are:  Monday to Thursday 8:00 AM to 4:00 PM  Provider's Schedule: Jatavious Peppard, MD:  Procedure days: Tuesday and Thursday 7:30 AM to 4:00 PM  Bilal Lateef, MD:  Procedure days: Monday and Wednesday 7:30 AM to 4:00 PM  ______________________________________________________________________    ____________________________________________________________________________________________  General Risks and Possible Complications  Patient Responsibilities: It is important that you read this as it is part of your informed consent. It is our duty to inform you of the risks and possible complications associated with treatments offered to you. It is your responsibility as a patient to read this and to ask questions about anything that is not clear or that you believe was not covered in this  document.  Patient's Rights: You have the right to refuse treatment. You also have the right to change your mind, even after initially having agreed to have the treatment done. However, under this last option, if you wait until the last second to change your mind, you may be charged for the materials used up to that point.  Introduction: Medicine is not an exact science. Everything in Medicine, including the lack of treatment(s), carries the potential for danger, harm, or loss (which is by definition: Risk). In Medicine, a complication is a secondary problem, condition, or disease that can aggravate an already existing one. All treatments carry the risk of possible complications. The fact that a side effects or complications occurs, does not imply that the treatment was conducted incorrectly. It must be clearly understood that these can happen even when everything is done following the highest safety standards.  No treatment: You can choose not to proceed with the proposed treatment alternative. The "PRO(s)" would include: avoiding the risk of complications associated with the therapy. The "CON(s)" would include: not getting any of the treatment benefits. These benefits fall under one of three categories: diagnostic; therapeutic; and/or palliative. Diagnostic benefits include: getting information which can ultimately lead to improvement of the disease or symptom(s). Therapeutic benefits are those associated with the successful treatment of the disease. Finally, palliative benefits are those related to the decrease of the primary symptoms, without necessarily curing the condition (example: decreasing the pain from a flare-up of a chronic condition, such as incurable terminal cancer).  General Risks and Complications: These are associated to most interventional treatments. They can occur alone, or in combination. They fall under one of the following six (6) categories: no benefit or worsening of symptoms;    bleeding; infection; nerve damage; allergic reactions; and/or death. No benefits or worsening of symptoms: In Medicine there are no guarantees, only probabilities. No healthcare provider can ever guarantee that a medical treatment will work, they can only state the probability that it may. Furthermore, there is always the possibility that the condition may worsen, either directly, or indirectly, as a consequence of the treatment. Bleeding: This is more common if the patient is taking a blood thinner, either prescription or over the counter (example: Goody Powders, Fish oil, Aspirin, Garlic, etc.), or if suffering a condition associated with impaired coagulation (example: Hemophilia, cirrhosis of the liver, low platelet counts, etc.). However, even if you do not have one on these, it can still happen. If you have any of these conditions, or take one of these drugs, make sure to notify your treating physician. Infection: This is more common in patients with a compromised immune system, either due to disease (example: diabetes, cancer, human immunodeficiency virus [HIV], etc.), or due to medications or treatments (example: therapies used to treat cancer and rheumatological diseases). However, even if you do not have one on these, it can still happen. If you have any of these conditions, or take one of these drugs, make sure to notify your treating physician. Nerve Damage: This is more common when the treatment is an invasive one, but it can also happen with the use of medications, such as those used in the treatment of cancer. The damage can occur to small secondary nerves, or to large primary ones, such as those in the spinal cord and brain. This damage may be temporary or permanent and it may lead to impairments that can range from temporary numbness to permanent paralysis and/or brain death. Allergic Reactions: Any time a substance or material comes in contact with our body, there is the possibility of an  allergic reaction. These can range from a mild skin rash (contact dermatitis) to a severe systemic reaction (anaphylactic reaction), which can result in death. Death: In general, any medical intervention can result in death, most of the time due to an unforeseen complication. ____________________________________________________________________________________________    ____________________________________________________________________________________________  Blood Thinners  IMPORTANT NOTICE:  If you take any of these, make sure to notify the nursing staff.  Failure to do so may result in injury.  Recommended time intervals to stop and restart blood-thinners, before & after invasive procedures  Generic Name Brand Name Pre-procedure. Stop this long before procedure. Post-procedure. Minimum waiting period before restarting.  Abciximab Reopro 15 days 2 hrs  Alteplase Activase 10 days 10 days  Anagrelide Agrylin    Apixaban Eliquis 3 days 6 hrs  Cilostazol Pletal 3 days 5 hrs  Clopidogrel Plavix 7-10 days 2 hrs  Dabigatran Pradaxa 5 days 6 hrs  Dalteparin Fragmin 24 hours 4 hrs  Dipyridamole Aggrenox 11days 2 hrs  Edoxaban Lixiana; Savaysa 3 days 2 hrs  Enoxaparin  Lovenox 24 hours 4 hrs  Eptifibatide Integrillin 8 hours 2 hrs  Fondaparinux  Arixtra 72 hours 12 hrs  Hydroxychloroquine Plaquenil 11 days   Prasugrel Effient 7-10 days 6 hrs  Reteplase Retavase 10 days 10 days  Rivaroxaban Xarelto 3 days 6 hrs  Ticagrelor Brilinta 5-7 days 6 hrs  Ticlopidine Ticlid 10-14 days 2 hrs  Tinzaparin Innohep 24 hours 4 hrs  Tirofiban Aggrastat 8 hours 2 hrs  Warfarin Coumadin 5 days 2 hrs   Other medications with blood-thinning effects  Product indications Generic (Brand) names Note  Cholesterol Lipitor Stop 4 days before procedure    Blood thinner (injectable) Heparin (LMW or LMWH Heparin) Stop 24 hours before procedure  Cancer Ibrutinib (Imbruvica) Stop 7 days before procedure   Malaria/Rheumatoid Hydroxychloroquine (Plaquenil) Stop 11 days before procedure  Thrombolytics  10 days before or after procedures   Over-the-counter (OTC) Products with blood-thinning effects  Product Common names Stop Time  Aspirin > 325 mg Goody Powders, Excedrin, etc. 11 days  Aspirin ? 81 mg  7 days  Fish oil  4 days  Garlic supplements  7 days  Ginkgo biloba  36 hours  Ginseng  24 hours  NSAIDs Ibuprofen, Naprosyn, etc. 3 days  Vitamin E  4 days   ____________________________________________________________________________________________    

## 2023-02-21 NOTE — Progress Notes (Signed)
Safety precautions to be maintained throughout the outpatient stay will include: orient to surroundings, keep bed in low position, maintain call bell within reach at all times, provide assistance with transfer out of bed and ambulation.  

## 2023-02-22 ENCOUNTER — Telehealth: Payer: Self-pay

## 2023-02-22 DIAGNOSIS — I5022 Chronic systolic (congestive) heart failure: Secondary | ICD-10-CM | POA: Insufficient documentation

## 2023-02-22 DIAGNOSIS — Z87891 Personal history of nicotine dependence: Secondary | ICD-10-CM | POA: Diagnosis not present

## 2023-02-22 DIAGNOSIS — E538 Deficiency of other specified B group vitamins: Secondary | ICD-10-CM | POA: Insufficient documentation

## 2023-02-22 DIAGNOSIS — E782 Mixed hyperlipidemia: Secondary | ICD-10-CM | POA: Diagnosis not present

## 2023-02-22 DIAGNOSIS — Z0000 Encounter for general adult medical examination without abnormal findings: Secondary | ICD-10-CM | POA: Diagnosis not present

## 2023-02-22 DIAGNOSIS — I110 Hypertensive heart disease with heart failure: Secondary | ICD-10-CM | POA: Diagnosis not present

## 2023-02-22 DIAGNOSIS — Z79899 Other long term (current) drug therapy: Secondary | ICD-10-CM | POA: Diagnosis not present

## 2023-02-22 DIAGNOSIS — Z87442 Personal history of urinary calculi: Secondary | ICD-10-CM | POA: Insufficient documentation

## 2023-02-22 DIAGNOSIS — G4733 Obstructive sleep apnea (adult) (pediatric): Secondary | ICD-10-CM | POA: Insufficient documentation

## 2023-02-22 DIAGNOSIS — I10 Essential (primary) hypertension: Secondary | ICD-10-CM | POA: Insufficient documentation

## 2023-02-22 DIAGNOSIS — Z951 Presence of aortocoronary bypass graft: Secondary | ICD-10-CM | POA: Insufficient documentation

## 2023-02-22 DIAGNOSIS — Z125 Encounter for screening for malignant neoplasm of prostate: Secondary | ICD-10-CM | POA: Diagnosis not present

## 2023-02-22 DIAGNOSIS — N401 Enlarged prostate with lower urinary tract symptoms: Secondary | ICD-10-CM | POA: Insufficient documentation

## 2023-02-22 DIAGNOSIS — Z955 Presence of coronary angioplasty implant and graft: Secondary | ICD-10-CM | POA: Insufficient documentation

## 2023-02-22 DIAGNOSIS — Z950 Presence of cardiac pacemaker: Secondary | ICD-10-CM | POA: Insufficient documentation

## 2023-02-22 DIAGNOSIS — I255 Ischemic cardiomyopathy: Secondary | ICD-10-CM | POA: Insufficient documentation

## 2023-02-22 NOTE — Telephone Encounter (Signed)
Yes, he needs to stop Plavix 7 days before his appt.

## 2023-02-22 NOTE — Telephone Encounter (Signed)
Do we have clearance to stop plavix for 7 days?

## 2023-02-23 ENCOUNTER — Encounter: Payer: Self-pay | Admitting: Cardiovascular Disease

## 2023-02-24 ENCOUNTER — Encounter: Payer: Self-pay | Admitting: Cardiovascular Disease

## 2023-02-24 MED ORDER — ISOSORBIDE MONONITRATE ER 60 MG PO TB24: 60.0000 mg | ORAL_TABLET | Freq: Every day | ORAL | 3 refills | Status: DC

## 2023-02-24 MED ORDER — CARVEDILOL 3.125 MG PO TABS: 3.1250 mg | ORAL_TABLET | Freq: Two times a day (BID) | ORAL | 3 refills | Status: DC

## 2023-02-25 NOTE — Progress Notes (Signed)
Remote pacemaker transmission.   

## 2023-02-26 ENCOUNTER — Encounter: Payer: Self-pay | Admitting: Cardiovascular Disease

## 2023-02-28 ENCOUNTER — Encounter: Payer: Self-pay | Admitting: Cardiovascular Disease

## 2023-02-28 MED ORDER — SPIRONOLACTONE 25 MG PO TABS: 12.5000 mg | ORAL_TABLET | Freq: Every day | ORAL | 3 refills | Status: DC

## 2023-03-01 NOTE — Progress Notes (Unsigned)
PROVIDER NOTE: Interpretation of information contained herein should be left to medically-trained personnel. Specific patient instructions are provided elsewhere under "Patient Instructions" section of medical record. This document was created in part using STT-dictation technology, any transcriptional errors that may result from this process are unintentional.  Patient: Matthew [name] Type: Established DOB: [birth year] MRN: [id number] PCP: Antionette Char, MD  Service: Procedure DOS: 03/03/2023 Setting: Ambulatory Location: Ambulatory outpatient facility Delivery: Face-to-face Provider: Gaspar Cola, MD Specialty: Interventional Pain Management Specialty designation: 09 Location: Outpatient facility Ref. Prov.: Milinda Pointer, MD       Interventional Therapy   Procedure: Lumbar Facet, Medial Branch Block(s) #4  Laterality: Right  Level: T12, L1, L2, L3, L4, L5, and S1 Medial Branch Level(s). Injecting these levels blocks the L1-2, L2-3, L3-4, L4-5, and L5-S1 lumbar facet joints. Imaging: Fluoroscopic guidance         Anesthesia: Local anesthesia (1-2% Lidocaine) Anxiolysis:    ***    Versed         Sedation:                         DOS: 03/03/2023 Performed by: Gaspar Cola, MD  Primary Purpose: Diagnostic/Therapeutic Indications: Low back pain severe enough to impact quality of life or function. No diagnosis found. NAS-11 Pain score:   Pre-procedure:  /10   Post-procedure:  /10     Position / Prep / Materials:  Position: Prone  Prep solution: DuraPrep (Iodine Povacrylex [0.7% available iodine] and Isopropyl Alcohol, 74% w/w) Area Prepped: Posterolateral Lumbosacral Spine (Wide prep: From the lower border of the scapula down to the end of the tailbone and from flank to flank.)  Materials:  Tray: Block Needle(s):  Type: Spinal  Gauge (G): 22  Length: 5-in Qty:    ***      Pre-op H&P Assessment:  Matthew [name] is a 87 y.o. (year old), male patient, seen today  for interventional treatment. 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 Matthew [name] has a current medication list which includes the following prescription(s): acetaminophen, aspirin ec, atorvastatin, carvedilol, clopidogrel, ezetimibe, isosorbide mononitrate, multiple vitamins-minerals, mupirocin ointment, niacinamide, pantoprazole, polypodium leucotomos, entresto, spironolactone, tamsulosin, tetrahydrozoline, turmeric, and cyanocobalamin, and the following Facility-Administered Medications: cyanocobalamin. His primarily concern today is the No chief complaint on file.  Initial Vital Signs:  Pulse/HCG Rate:    Temp:   Resp:   BP:   SpO2:    BMI: Estimated body mass index is 28.94 kg/m as calculated from the following:   Height as of 02/21/23: 5\' 9"  (1.753 m).   Weight as of 02/21/23: 196 lb (88.9 kg).  Risk Assessment: Allergies: Reviewed. He is allergic to ranitidine and ranexa [ranolazine er].  Allergy Precautions: None required Coagulopathies: Reviewed. None identified.   Blood-thinner therapy: None at this time Active Infection(s): Reviewed. None identified. Matthew [name] is afebrile  Site Confirmation: Matthew [name] was asked to confirm the procedure and laterality before marking the site Procedure checklist: Completed Consent: Before the procedure and under the influence of no sedative(s), amnesic(s), or anxiolytics, the patient was informed of the treatment options, risks and possible complications. To fulfill our ethical and legal obligations, as recommended by the American Medical Association's Code of Ethics, I have informed the patient of my clinical impression; the nature and purpose of the treatment or procedure; the risks, benefits, and possible complications of the intervention; the alternatives, including doing nothing; the risk(s) and benefit(s) of the alternative treatment(s) or procedure(s); and the risk(s) and benefit(s) of doing nothing. The patient was provided information about the general risks and possible complications associated with the procedure. These may include, but are not limited to: failure to achieve desired goals, infection, bleeding, organ or nerve damage, allergic reactions, paralysis, and death. In addition, the patient was informed of those risks and complications associated to Spine-related procedures, such as failure to decrease pain; infection (i.e.: Meningitis, epidural or intraspinal abscess); bleeding (i.e.: epidural hematoma, subarachnoid hemorrhage, or any other type of intraspinal or peri-dural bleeding); organ or nerve damage (i.e.: Any type of peripheral nerve, nerve root, or spinal cord injury) with subsequent damage to sensory, motor, and/or autonomic systems, resulting in permanent pain, numbness, and/or weakness of one or several areas of the body; allergic reactions; (i.e.: anaphylactic reaction); and/or death. Furthermore, the patient was informed of those risks and complications associated with the medications. These include,  but are not limited to: allergic reactions (i.e.: anaphylactic or anaphylactoid reaction(s)); adrenal axis suppression; blood sugar elevation that in diabetics may result in ketoacidosis or comma; water retention that in patients with history of congestive heart failure may result in shortness of breath, pulmonary edema, and decompensation with resultant heart failure; weight gain; swelling or edema; medication-induced neural toxicity; particulate matter embolism and blood vessel occlusion with resultant organ, and/or nervous system infarction; and/or aseptic necrosis of one or more joints. Finally, the patient was informed that Medicine is not an exact science; therefore, there is also the possibility of unforeseen or unpredictable risks and/or possible complications that may result in a catastrophic outcome. The patient indicated having understood very clearly. We have given the patient no guarantees and we have made no promises. Enough time was given to the patient to ask questions, all of which were answered to the patient's satisfaction. Mr. Strayer has indicated that he wanted to continue with the procedure. Attestation: I, the ordering provider, attest that I have discussed with the patient the benefits, risks, side-effects, alternatives, likelihood of achieving goals, and potential problems during recovery for the procedure that I have provided informed consent. Date  Time: {CHL [facility]IN TIME CHOICES:[id number]}   Pre-Procedure Preparation:  Monitoring: As per clinic protocol. Respiration, ETCO2, SpO2, BP, heart rate and rhythm monitor placed and checked for adequate function Safety Precautions: Patient was assessed for positional comfort and pressure points before starting the procedure. Time-out: I initiated and conducted the "Time-out" before starting the procedure, as per protocol. The patient was asked to participate by confirming the accuracy of the "Time Out" information. Verification of the  correct person, site, and procedure were performed and confirmed by me, the nursing staff, and the patient. "Time-out" conducted as per Joint Commission's Universal Protocol (UP.01.01.01). Time:    Description of Procedure:          Laterality: (see above) Targeted Levels: (see above)  Safety Precautions: Aspiration looking for blood return was conducted  prior to all injections. At no point did we inject any substances, as a needle was being advanced. Before injecting, the patient was told to immediately notify me if he was experiencing any new onset of "ringing in the ears, or metallic taste in the mouth". No attempts were made at seeking any paresthesias. Safe injection practices and needle disposal techniques used. Medications properly checked for expiration dates. SDV (single dose vial) medications used. After the completion of the procedure, all disposable equipment used was discarded in the proper designated medical waste containers. Local Anesthesia: Protocol guidelines were followed. The patient was positioned over the fluoroscopy table. The area was prepped in the usual manner. The time-out was completed. The target area was identified using fluoroscopy. A 12-in long, straight, sterile hemostat was used with fluoroscopic guidance to locate the targets for each level blocked. Once located, the skin was marked with an approved surgical skin marker. Once all sites were marked, the skin (epidermis, dermis, and hypodermis), as well as deeper tissues (fat, connective tissue and muscle) were infiltrated with a small amount of a short-acting local anesthetic, loaded on a 10cc syringe with a 25G, 1.5-in  Needle. An appropriate amount of time was allowed for local anesthetics to take effect before proceeding to the next step. Local Anesthetic: Lidocaine 2.0% The unused portion of the local anesthetic was discarded in the proper designated containers. Technical description of process:  L2 Medial Branch  Nerve Block (MBB): The target area for the L2 medial branch is at the junction of the postero-lateral aspect of the superior articular process and the superior, posterior, and medial edge of the transverse process of L3. Under fluoroscopic guidance, a Quincke needle was inserted until contact was made with os over the superior postero-lateral aspect of the pedicular shadow (target area). After negative aspiration for blood, 0.5 mL of the nerve block solution was injected without difficulty or complication. The needle was removed intact. L3 Medial Branch Nerve Block (MBB): The target area for the L3 medial branch is at the junction of the postero-lateral aspect of the superior articular process and the superior, posterior, and medial edge of the transverse process of L4. Under fluoroscopic guidance, a Quincke needle was inserted until contact was made with os over the superior postero-lateral aspect of the pedicular shadow (target area). After negative aspiration for blood, 0.5 mL of the nerve block solution was injected without difficulty or complication. The needle was removed intact. L4 Medial Branch Nerve Block (MBB): The target area for the L4 medial branch is at the junction of the postero-lateral aspect of the superior articular process and the superior, posterior, and medial edge of the transverse process of L5. Under fluoroscopic guidance, a Quincke needle was inserted until contact was made with os over the superior postero-lateral aspect of the pedicular shadow (target area). After negative aspiration for blood, 0.5 mL of the nerve block solution was injected without difficulty or complication. The needle was removed intact. L5 Medial Branch Nerve Block (MBB): The target area for the L5 medial branch is at the junction of the postero-lateral aspect of the superior articular process and the superior, posterior, and medial edge of the sacral ala. Under fluoroscopic guidance, a Quincke needle was inserted  until contact was made with os over the superior postero-lateral aspect of the pedicular shadow (target area). After negative aspiration for blood, 0.5 mL of the nerve block solution was injected without difficulty or complication. The needle was removed intact. S1 Medial Branch Nerve Block (MBB):  The target area for the S1 medial branch is at the posterior and inferior 6 o'clock position of the L5-S1 facet joint. Under fluoroscopic guidance, the Quincke needle inserted for the L5 MBB was redirected until contact was made with os over the inferior and postero aspect of the sacrum, at the 6 o' clock position under the L5-S1 facet joint (Target area). After negative aspiration for blood, 0.5 mL of the nerve block solution was injected without difficulty or complication. The needle was removed intact.  Once the entire procedure was completed, the treated area was cleaned, making sure to leave some of the prepping solution back to take advantage of its long term bactericidal properties.         Illustration of the posterior view of the lumbar spine and the posterior neural structures. Laminae of L2 through S1 are labeled. DPRL5, dorsal primary ramus of L5; DPRS1, dorsal primary ramus of S1; DPR3, dorsal primary ramus of L3; FJ, facet (zygapophyseal) joint L3-L4; I, inferior articular process of L4; LB1, lateral branch of dorsal primary ramus of L1; IAB, inferior articular branches from L3 medial branch (supplies L4-L5 facet joint); IBP, intermediate branch plexus; MB3, medial branch of dorsal primary ramus of L3; NR3, third lumbar nerve root; S, superior articular process of L5; SAB, superior articular branches from L4 (supplies L4-5 facet joint also); TP3, transverse process of L3.  There were no vitals filed for this visit.   Start Time:   hrs. End Time:   hrs.  Imaging Guidance (Spinal):          Type of Imaging Technique: Fluoroscopy Guidance (Spinal) Indication(s): Assistance in needle guidance  and placement for procedures requiring needle placement in or near specific anatomical locations not easily accessible without such assistance. Exposure Time: Please see nurses notes. Contrast: None used. Fluoroscopic Guidance: I was personally present during the use of fluoroscopy. "Tunnel Vision Technique" used to obtain the best possible view of the target area. Parallax error corrected before commencing the procedure. "Direction-depth-direction" technique used to introduce the needle under continuous pulsed fluoroscopy. Once target was reached, antero-posterior, oblique, and lateral fluoroscopic projection used confirm needle placement in all planes. Images permanently stored in EMR. Interpretation: No contrast injected. I personally interpreted the imaging intraoperatively. Adequate needle placement confirmed in multiple planes. Permanent images saved into the patient's record.  Post-operative Assessment:  Post-procedure Vital Signs:  Pulse/HCG Rate:    Temp:   Resp:   BP:   SpO2:    EBL: None  Complications: No immediate post-treatment complications observed by team, or reported by patient.  Note: The patient tolerated the entire procedure well. A repeat set of vitals were taken after the procedure and the patient was kept under observation following institutional policy, for this type of procedure. Post-procedural neurological assessment was performed, showing return to baseline, prior to discharge. The patient was provided with post-procedure discharge instructions, including a section on how to identify potential problems. Should any problems arise concerning this procedure, the patient was given instructions to immediately contact us, at any time, without hesitation. In any case, we plan to contact the patient by telephone for a follow-up status report regarding this interventional procedure.  Comments:  No additional relevant information.  Plan of Care (POC)  Orders:  No orders of  the defined types were placed in this encounter.  Chronic Opioid Analgesic:  Tramadol 50 mg 1 tablet by mouth every 8 hours (150 mg/day of tramadol) (15 MME/day) MME/day: 15 mg/day.   Medications ordered  for procedure: No orders of the defined types were placed in this encounter.  Medications administered: Nehemiah Settle. Mittelman had no medications administered during this visit.  See the medical record for exact dosing, route, and time of administration.  Follow-up plan:   No follow-ups on file.       Interventional Therapies  Risk  Complexity Considerations:   PLAVIX ANTICOAGULATION: (Stop: 7-10 days  Restart: 2 hours)    Planned  Pending:   Therapeutic right lumbar facet MBB #4 + right IA hip inj. #2    Under consideration:   Therapeutic bilateral lumbar facet MBB #4 + bilateral SI joint Blk #4 (last done 07/24/2020)  Possible bilateral lumbar facet RFA #1 Possible bilateral SI joint RFA #1  Diagnostic left L2 TFESI  Diagnostic bilateral L3 TFESI  Diagnostic bilateral hip injections    Completed:   Diagnostic/therapeutic right IA hip joint inj. x1 (06/10/2022) (100/100/75/100)  Diagnostic bilateral lumbar facet MBB x3 (07/24/2020) (100/100/75/75)  Diagnostic bilateral SI joint Blk x3 (07/24/2020) (100/100/75/75)  Therapeutic/palliative left L4-5 LESI x2 (03/09/2018) (100/100/85/75-100)    Therapeutic  Palliative (PRN) options:   Therapeutic lumbar facet MBB   Therapeutic SI joint Blk   Therapeutic/palliative left L4-5 LESI #3          Recent Visits Date Type Provider Dept  02/21/23 Office Visit Milinda Pointer, MD [facility]  Showing recent visits within past 90 days and meeting all other requirements Future Appointments Date Type Provider Dept  03/03/23 Appointment Milinda Pointer, MD [facility]  Showing future appointments within next 90 days and meeting all other requirements  Disposition: Discharge home  Discharge (Date  Time):  03/03/2023;   hrs.   Primary Care Physician: Antionette Char, MD Location: [facility] Note by: Gaspar Cola, MD (TTS technology used. I apologize for any typographical errors that were not detected and corrected.) Date: 03/03/2023; Time: 3:15 PM  Disclaimer:  Medicine is not an Chief Strategy Officer. The only guarantee in medicine is that nothing is guaranteed. It is important to note that the decision to proceed with this intervention was based on the information collected from the patient. The Data and conclusions were drawn from the patient's questionnaire, the interview, and the physical examination. Because the information was provided in large part by the patient, it cannot be guaranteed that it has not been purposely or unconsciously manipulated. Every effort has been made to obtain as much relevant data as possible for this evaluation. It is important to note that the conclusions that lead to this procedure are derived in large part from the available data. Always take into account that the treatment will also be dependent on availability of resources and existing treatment guidelines, considered by other Pain Management Practitioners as being common knowledge and practice, at the time of the intervention. For Medico-Legal purposes, it is also important to point out that variation in procedural techniques and pharmacological choices are the acceptable norm. The indications, contraindications, technique, and results of the above procedure should only be interpreted and judged by a Board-Certified Interventional Pain Specialist with extensive familiarity and expertise in the same exact procedure and technique.   PROVIDER NOTE: Interpretation of information contained herein should be left to medically-trained personnel. Specific patient instructions are provided elsewhere under "Patient Instructions" section of medical record. This document was created in part using  STT-dictation technology, any transcriptional errors that may result from this process are unintentional.  Patient: Matthew [name] Type: Established DOB:  Aug 08, 1936 MRN: [id number] PCP: Antionette Char, MD  Service: Procedure DOS: 03/03/2023 Setting: Ambulatory Location: Ambulatory outpatient facility Delivery: Face-to-face Provider: Gaspar Cola, MD Specialty: Interventional Pain Management Specialty designation: 09 Location: Outpatient facility Ref. Prov.: Milinda Pointer, MD       Interventional Therapy   Procedure: Intra-articular hip injection  #2  Laterality: Right (-RT)  Approach: Percutaneous posterolateral approach. Level: Lower pelvic and hip joint level.  Imaging: Fluoroscopy-guided         Anesthesia: Local anesthesia (1-2% Lidocaine) Anxiolysis:    ***                    Sedation:                         DOS: 03/03/2023  Performed by: Gaspar Cola, MD  Purpose: Diagnostic/Therapeutic Indications: Hip pain severe enough to impact quality of life or function. Rationale (medical necessity): procedure needed and proper for the diagnosis and/or treatment of Mr. Yerton medical symptoms and needs. No diagnosis found. NAS-11 Pain score:   Pre-procedure:  /10   Post-procedure:  /10      Target: Intra-articular hip joint Region: Hip joint, upper (proximal) femoral region Type of procedure: Percutaneous joint injection   Position / Prep / Materials:  Position:    ***      Prep solution: DuraPrep (Iodine Povacrylex [0.7% available iodine] and Isopropyl Alcohol, 74% w/w) Prep Area:  Entire Posterolateral hip area. Materials:  Tray: Block tray Needle(s):  Type: Spinal  Gauge (G): 22  Length:    ***     Qty:    ***     Pre-op H&P Assessment:  Mr. Musick is a 87 y.o. (year old), male patient, seen today for interventional treatment. 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 Mr. Turlington has a current medication list which includes the following prescription(s): acetaminophen, aspirin ec, atorvastatin, carvedilol, clopidogrel, ezetimibe, isosorbide mononitrate, multiple vitamins-minerals, mupirocin ointment, niacinamide, pantoprazole, polypodium leucotomos, entresto, spironolactone, tamsulosin, tetrahydrozoline, turmeric, and cyanocobalamin, and the following Facility-Administered Medications: cyanocobalamin. His primarily concern today is the No chief complaint on file.  Initial Vital Signs:  Pulse/HCG Rate:    Temp:   Resp:   BP:   SpO2:    BMI: Estimated body mass index is 28.94 kg/m as calculated from the following:   Height as of 02/21/23: 5\' 9"  (1.753 m).   Weight as of 02/21/23: 196 lb (88.9 kg).  Risk Assessment: Allergies: Reviewed. He is allergic to ranitidine and ranexa [ranolazine er].  Allergy Precautions: None required Coagulopathies: Reviewed. None identified.  Blood-thinner therapy: None at this time Active Infection(s): Reviewed. None identified. Mr. Denley is afebrile  Site Confirmation: Mr. Iddings was asked to  confirm the procedure and laterality before marking the site Procedure checklist: Completed Consent: Before the procedure and under the influence of no  sedative(s), amnesic(s), or anxiolytics, the patient was informed of the treatment options, risks and possible complications. To fulfill our ethical and legal obligations, as recommended by the American Medical Association's Code of Ethics, I have informed the patient of my clinical impression; the nature and purpose of the treatment or procedure; the risks, benefits, and possible complications of the intervention; the alternatives, including doing nothing; the risk(s) and benefit(s) of the alternative treatment(s) or procedure(s); and the risk(s) and benefit(s) of doing nothing. The patient was provided information about the general risks and possible complications associated with the procedure. These may include, but are not limited to: failure to achieve desired goals, infection, bleeding, organ or nerve damage, allergic reactions, paralysis, and death. In addition, the patient was informed of those risks and complications associated to the procedure, such as failure to decrease pain; infection; bleeding; organ or nerve damage with subsequent damage to sensory, motor, and/or autonomic systems, resulting in permanent pain, numbness, and/or weakness of one or several areas of the body; allergic reactions; (i.e.: anaphylactic reaction); and/or death. Furthermore, the patient was informed of those risks and complications associated with the medications. These include, but are not limited to: allergic reactions (i.e.: anaphylactic or anaphylactoid reaction(s)); adrenal axis suppression; blood sugar elevation that in diabetics may result in ketoacidosis or comma; water retention that in patients with history of congestive heart failure may result in shortness of breath, pulmonary edema, and decompensation with resultant heart failure; weight gain; swelling or  edema; medication-induced neural toxicity; particulate matter embolism and blood vessel occlusion with resultant organ, and/or nervous system infarction; and/or aseptic necrosis of one or more joints. Finally, the patient was informed that Medicine is not an exact science; therefore, there is also the possibility of unforeseen or unpredictable risks and/or possible complications that may result in a catastrophic outcome. The patient indicated having understood very clearly. We have given the patient no guarantees and we have made no promises. Enough time was given to the patient to ask questions, all of which were answered to the patient's satisfaction. Mr. Bacani has indicated that he wanted to continue with the procedure. Attestation: I, the ordering provider, attest that I have discussed with the patient the benefits, risks, side-effects, alternatives, likelihood of achieving goals, and potential problems during recovery for the procedure that I have provided informed consent. Date  Time: {[facility] TIME CHOICES:[id number]}  Pre-Procedure Preparation:  Monitoring: As per clinic protocol. Respiration, ETCO2, SpO2, BP, heart rate and rhythm monitor placed and checked for adequate function Safety Precautions: Patient was assessed for positional comfort and pressure points before starting the procedure. Time-out: I initiated and conducted the "Time-out" before starting the procedure, as per protocol. The patient was asked to participate by confirming the accuracy of the "Time Out" information. Verification of the correct person, site, and procedure were performed and confirmed by me, the nursing staff, and the patient. "Time-out" conducted as per Joint Commission's Universal Protocol (UP.01.01.01). Time:    Description/Narrative of Procedure:          Rationale (medical necessity): procedure needed and proper for the diagnosis and/or treatment of the patient's medical symptoms and needs. Procedural  Technique Safety Precautions: Aspiration looking for blood return was conducted prior to all injections. At no point did we inject any substances, as a needle was being advanced. No attempts were made at seeking any paresthesias. Safe injection practices and needle disposal techniques used. Medications properly checked for expiration dates. SDV (single dose vial) medications used. Description of the  Procedure: Protocol guidelines were followed. The patient was assisted into a comfortable position. The target area was identified and the area prepped in the usual manner. Skin & deeper tissues infiltrated with local anesthetic. Appropriate amount of time allowed to pass for local anesthetics to take effect. The procedure needles were then advanced to the target area. Proper needle placement secured. Negative aspiration confirmed. Solution injected in intermittent fashion, asking for systemic symptoms every 0.5cc of injectate. The needles were then removed and the area cleansed, making sure to leave some of the prepping solution back to take advantage of its long term bactericidal properties.  Technical description of procedure:  Skin & deeper tissues infiltrated with local anesthetic. Appropriate amount of time allowed to pass for local anesthetics to take effect. The procedure needles were then advanced to the target area. Proper needle placement secured. Negative aspiration confirmed. Solution injected in intermittent fashion, asking for systemic symptoms every 0.5cc of injectate. The needles were then removed and the area cleansed, making sure to leave some of the prepping solution back to take advantage of its long term bactericidal properties.       There were no vitals filed for this visit.   Start Time:   hrs. End Time:   hrs.  Imaging Guidance (Non-Spinal):          Type of Imaging Technique: Fluoroscopy Guidance (Non-Spinal) Indication(s): Assistance in needle guidance and placement for  procedures requiring needle placement in or near specific anatomical locations not easily accessible without such assistance. Exposure Time: Please see nurses notes. Contrast: Before injecting any contrast, we confirmed that the patient did not have an allergy to iodine, shellfish, or radiological contrast. Once satisfactory needle placement was completed at the desired level, radiological contrast was injected. Contrast injected under live fluoroscopy. No contrast complications. See chart for type and volume of contrast used. Fluoroscopic Guidance: I was personally present during the use of fluoroscopy. "Tunnel Vision Technique" used to obtain the best possible view of the target area. Parallax error corrected before commencing the procedure. "Direction-depth-direction" technique used to introduce the needle under continuous pulsed fluoroscopy. Once target was reached, antero-posterior, oblique, and lateral fluoroscopic projection used confirm needle placement in all planes. Images permanently stored in EMR. Interpretation: I personally interpreted the imaging intraoperatively. Adequate needle placement confirmed in multiple planes. Appropriate spread of contrast into desired area was observed. No evidence of afferent or efferent intravascular uptake. Permanent images saved into the patient's record.  Post-operative Assessment:  Post-procedure Vital Signs:  Pulse/HCG Rate:    Temp:   Resp:   BP:   SpO2:    EBL: None  Complications: No immediate post-treatment complications observed by team, or reported by patient.  Note: The patient tolerated the entire procedure well. A repeat set of vitals were taken after the procedure and the patient was kept under observation following institutional policy, for this type of procedure. Post-procedural neurological assessment was performed, showing return to baseline, prior to discharge. The patient was provided with post-procedure discharge instructions,  including a section on how to identify potential problems. Should any problems arise concerning this procedure, the patient was given instructions to immediately contact us, at any time, without hesitation. In any case, we plan to contact the patient by telephone for a follow-up status report regarding this interventional procedure.  Comments:  No additional relevant information.  Plan of Care (POC)  Orders:  No orders of the defined types were placed in this encounter.  Chronic Opioid Analgesic:  Tramadol  50 mg 1 tablet by mouth every 8 hours (150 mg/day of tramadol) (15 MME/day) MME/day: 15 mg/day.   Medications ordered for procedure: No orders of the defined types were placed in this encounter.  Medications administered: Nehemiah Settle. [place] had no medications administered during this visit.  See the medical record for exact dosing, route, and time of administration.  Follow-up plan:   No follow-ups on file.       Interventional Therapies  Risk  Complexity Considerations:   PLAVIX ANTICOAGULATION: (Stop: 7-10 days  Restart: 2 hours)    Planned  Pending:   Therapeutic right lumbar facet MBB #4 + right IA hip inj. #2    Under consideration:   Therapeutic bilateral lumbar facet MBB #4 + bilateral SI joint Blk #4 (last done 07/24/2020)  Possible bilateral lumbar facet RFA #1 Possible bilateral SI joint RFA #1  Diagnostic left L2 TFESI  Diagnostic bilateral L3 TFESI  Diagnostic bilateral hip injections    Completed:   Diagnostic/therapeutic right IA hip joint inj. x1 (06/10/2022) (100/100/75/100)  Diagnostic bilateral lumbar facet MBB x3 (07/24/2020) (100/100/75/75)  Diagnostic bilateral SI joint Blk x3 (07/24/2020) (100/100/75/75)  Therapeutic/palliative left L4-5 LESI x2 (03/09/2018) (100/100/85/75-100)    Therapeutic  Palliative (PRN) options:   Therapeutic lumbar facet MBB   Therapeutic SI joint Blk   Therapeutic/palliative left L4-5 LESI #3          Recent  Visits Date Type Provider Dept  02/21/23 Office Visit Milinda Pointer, MD [facility]  Showing recent visits within past 90 days and meeting all other requirements Future Appointments Date Type Provider Dept  03/03/23 Appointment Milinda Pointer, MD [facility]  Showing future appointments within next 90 days and meeting all other requirements  Disposition: Discharge home  Discharge (Date  Time): 03/03/2023;   hrs.   Primary Care Physician: Antionette Char, MD Location: [facility] Note by: Gaspar Cola, MD (TTS technology used. I apologize for any typographical errors that were not detected and corrected.) Date: 03/03/2023; Time: 3:15 PM  Disclaimer:  Medicine is not an Chief Strategy Officer. The only guarantee in medicine is that nothing is guaranteed. It is important to note that the decision to proceed with this intervention was based on the information collected from the patient. The Data and conclusions were drawn from the patient's questionnaire, the interview, and the physical examination. Because the information was provided in large part by the patient, it cannot be guaranteed that it has not been purposely or unconsciously manipulated. Every effort has been made to obtain as much relevant data as possible for this evaluation. It is important to note that the conclusions that lead to this procedure are derived in large part from the available data. Always take into account that the treatment will also be dependent on availability of resources and existing treatment guidelines, considered by other Pain Management Practitioners as being common knowledge and practice, at the time of the intervention. For Medico-Legal purposes, it is also important to point out that variation in procedural techniques and pharmacological choices are the acceptable norm. The indications, contraindications, technique, and results of the above procedure should  only be interpreted and judged by a Board-Certified Interventional Pain Specialist with extensive familiarity and expertise in the same exact procedure and technique.

## 2023-03-03 ENCOUNTER — Encounter: Payer: Self-pay | Admitting: Pain Medicine

## 2023-03-03 ENCOUNTER — Ambulatory Visit
Admission: RE | Admit: 2023-03-03 | Discharge: 2023-03-03 | Disposition: A | Discharge status: Home / Self Care | Payer: Medicare HMO | Source: Ambulatory Visit | Attending: Pain Medicine | Admitting: Pain Medicine

## 2023-03-03 ENCOUNTER — Ambulatory Visit: Payer: Medicare HMO | Attending: Pain Medicine | Admitting: Pain Medicine

## 2023-03-03 VITALS — BP 143/77 | HR 70 | Temp 97.3°F | Resp 16 | Ht 69.0 in | Wt 199.0 lb

## 2023-03-03 DIAGNOSIS — M5136 Other intervertebral disc degeneration, lumbar region: Secondary | ICD-10-CM | POA: Diagnosis not present

## 2023-03-03 DIAGNOSIS — M545 Low back pain, unspecified: Secondary | ICD-10-CM | POA: Insufficient documentation

## 2023-03-03 DIAGNOSIS — G8929 Other chronic pain: Secondary | ICD-10-CM | POA: Diagnosis not present

## 2023-03-03 DIAGNOSIS — R937 Abnormal findings on diagnostic imaging of other parts of musculoskeletal system: Secondary | ICD-10-CM | POA: Insufficient documentation

## 2023-03-03 DIAGNOSIS — M47816 Spondylosis without myelopathy or radiculopathy, lumbar region: Secondary | ICD-10-CM | POA: Diagnosis not present

## 2023-03-03 DIAGNOSIS — Z7901 Long term (current) use of anticoagulants: Secondary | ICD-10-CM

## 2023-03-03 DIAGNOSIS — M25551 Pain in right hip: Secondary | ICD-10-CM | POA: Insufficient documentation

## 2023-03-03 DIAGNOSIS — M1611 Unilateral primary osteoarthritis, right hip: Secondary | ICD-10-CM | POA: Insufficient documentation

## 2023-03-03 DIAGNOSIS — M47817 Spondylosis without myelopathy or radiculopathy, lumbosacral region: Secondary | ICD-10-CM | POA: Insufficient documentation

## 2023-03-03 MED ORDER — TRIAMCINOLONE ACETONIDE 40 MG/ML IJ SUSP
INTRAMUSCULAR | Status: AC
  Filled 2023-03-03: qty 2

## 2023-03-03 MED ORDER — PENTAFLUOROPROP-TETRAFLUOROETH EX AERO
INHALATION_SPRAY | Freq: Once | CUTANEOUS | Status: AC
  Administered 2023-03-03: 30 via TOPICAL
  Filled 2023-03-03: qty 116

## 2023-03-03 MED ORDER — ROPIVACAINE HCL 2 MG/ML IJ SOLN
INTRAMUSCULAR | Status: AC
  Filled 2023-03-03: qty 20

## 2023-03-03 MED ORDER — MIDAZOLAM HCL 2 MG/2ML IJ SOLN
INTRAMUSCULAR | Status: AC
  Filled 2023-03-03: qty 2

## 2023-03-03 MED ORDER — ROPIVACAINE HCL 2 MG/ML IJ SOLN
18.0000 mL | Freq: Once | INTRAMUSCULAR | Status: AC
  Administered 2023-03-03: 18 mL via PERINEURAL

## 2023-03-03 MED ORDER — TRIAMCINOLONE ACETONIDE 40 MG/ML IJ SUSP
80.0000 mg | Freq: Once | INTRAMUSCULAR | Status: AC
  Administered 2023-03-03: 80 mg

## 2023-03-03 MED ORDER — METHYLPREDNISOLONE ACETATE 80 MG/ML IJ SUSP
INTRAMUSCULAR | Status: AC
  Filled 2023-03-03: qty 1

## 2023-03-03 MED ORDER — LIDOCAINE HCL 2 % IJ SOLN
INTRAMUSCULAR | Status: AC
  Filled 2023-03-03: qty 20

## 2023-03-03 MED ORDER — METHYLPREDNISOLONE ACETATE 80 MG/ML IJ SUSP
80.0000 mg | Freq: Once | INTRAMUSCULAR | Status: AC
  Administered 2023-03-03: 80 mg via INTRA_ARTICULAR

## 2023-03-03 MED ORDER — LIDOCAINE HCL 2 % IJ SOLN
20.0000 mL | Freq: Once | INTRAMUSCULAR | Status: AC
  Administered 2023-03-03: 400 mg

## 2023-03-03 NOTE — Addendum Note (Signed)
Addended by: Milinda Pointer A on: 03/03/2023 12:51 PM   Modules accepted: Orders

## 2023-03-03 NOTE — Progress Notes (Signed)
Safety precautions to be maintained throughout the outpatient stay will include: orient to surroundings, keep bed in low position, maintain call bell within reach at all times, provide assistance with transfer out of bed and ambulation.  

## 2023-03-03 NOTE — Patient Instructions (Signed)

## 2023-03-04 ENCOUNTER — Telehealth: Payer: Self-pay

## 2023-03-04 MED ORDER — CLOPIDOGREL BISULFATE 75 MG PO TABS: 75.0000 mg | ORAL_TABLET | Freq: Every day | ORAL | 1 refills | Status: DC

## 2023-03-04 NOTE — Telephone Encounter (Signed)
Post procedure follow up.  Patient family states he is doing good.

## 2023-03-17 ENCOUNTER — Other Ambulatory Visit: Payer: Self-pay | Admitting: Cardiovascular Disease

## 2023-03-17 ENCOUNTER — Ambulatory Visit: Payer: Medicare HMO | Attending: Pain Medicine | Admitting: Pain Medicine

## 2023-03-17 DIAGNOSIS — M545 Low back pain, unspecified: Secondary | ICD-10-CM | POA: Diagnosis not present

## 2023-03-17 DIAGNOSIS — G8929 Other chronic pain: Secondary | ICD-10-CM

## 2023-03-17 DIAGNOSIS — M47816 Spondylosis without myelopathy or radiculopathy, lumbar region: Secondary | ICD-10-CM

## 2023-03-17 NOTE — Progress Notes (Signed)
Patient: Matthew [name]  Service Category: E/M  Provider: Gaspar Cola, MD  DOB: 1936-07-10  DOS: 03/17/2023  Location: Office  MRN: [id number]  Setting: Ambulatory outpatient  Referring Provider: Antionette Char, MD  Type: Established Patient  Specialty: Interventional Pain Management  PCP: Antionette Char, MD  Location: Remote location  Delivery: TeleHealth     Virtual Encounter - Pain Management PROVIDER NOTE: Information contained herein reflects review and annotations entered in association with encounter. Interpretation of such information and data should be left to medically-trained personnel. Information provided to patient can be located elsewhere in the medical record under "Patient Instructions". Document created using STT-dictation technology, any transcriptional errors that may result from process are unintentional.    Contact & Pharmacy Preferred: [phone] Home: [phone] (home) Mobile: [phone] (mobile) E-mail: [email or web address] .[place], [place] [address] Phone: [phone] Fax: [phone]  [facility] ([place], [facility] [address] [place] [address] Phone: [phone] Fax: [phone]  [place], [place] [address] Phone: [phone] Fax: [phone]  [place], [place] [address] Phone: [phone] Fax: [phone]   Pre-screening  Matthew [name] offered "in-person" vs "virtual" encounter. He indicated preferring virtual for this encounter.   Reason COVID-19*  Social distancing based on CDC and AMA recommendations.   I contacted Matthew [name] on 03/17/2023 via telephone.      I clearly identified myself as Gaspar Cola,  MD. I verified that I was speaking with the correct person using two identifiers (Name: Matthew [name], and date of birth: Jan 19, 1936).  Consent I sought verbal advanced consent from Matthew [name] for virtual visit interactions. I informed Matthew [name] of possible security and privacy concerns, risks, and limitations associated with providing "not-in-person" medical evaluation and management services. I also informed Matthew [name] of the availability of "in-person" appointments. Finally, I informed him that there would be a charge for the virtual visit and that he could be  personally, fully or partially, financially responsible for it. Matthew [name] expressed understanding and agreed to proceed.   Historic Elements   Matthew [name] is a 87 y.o. year old, male patient evaluated today after our last contact on 03/03/2023. Matthew [name]  has a past medical history of Alcohol abuse, Anemia, AS (aortic stenosis), CAD (coronary artery disease), Carotid bruit, Chest pain, GERD (gastroesophageal reflux disease), History of kidney stones, HOH (hard of hearing), HTN (hypertension), Hyperlipidemia, Myocardial infarction ([place]) (1996), Nephrolithiasis, OSA (obstructive sleep apnea), PAD (peripheral artery disease) ([place]) (02/2014), Shortness of breath, Squamous cell carcinoma of skin (12/17/2020), Squamous cell carcinoma of skin (12/17/2020), Squamous cell carcinoma of skin (12/17/2020), and Thrombocytopenia ([place]). He also  has a past surgical history that includes Stomach surgery; Coronary artery bypass graft (1991); Inguinal hernia repair (11/10/2011); left heart catheterization with coronary/graft angiogram (N/A, 03/06/2014); abdominal aortagram (N/A, 03/06/2014); Cardiac catheterization (02/2014); Cardiac catheterization (N/A, 07/31/2015); Cardiac catheterization (N/A, 07/31/2015); Cardiac catheterization (N/A, 05/26/2016); Cysto; Transcatheter aortic valve replacement, transfemoral (N/A, 06/01/2016); TEE without cardioversion (N/A,  06/01/2016); LEFT HEART CATH AND CORONARY ANGIOGRAPHY (Left, 05/15/2018); Esophagogastroduodenoscopy (egd) with propofol (N/A, 10/07/2018); Colonoscopy with propofol (N/A, 10/07/2018); Givens capsule study (N/A, 12/22/2018); enteroscopy (N/A, 12/25/2018); Colonoscopy with propofol (N/A,  01/08/2019); Laparoscopic right colectomy (Right, 02/09/2019); BIV PACEMAKER INSERTION CRT-P (N/A, 07/23/2019); and RIGHT HEART CATH AND CORONARY/GRAFT ANGIOGRAPHY (N/A, 09/05/2020). Matthew [name] has a current medication list which includes the following prescription(s): acetaminophen, aspirin ec, atorvastatin, carvedilol, clopidogrel, ezetimibe, isosorbide mononitrate, multiple vitamins-minerals, mupirocin ointment, niacinamide, pantoprazole, polypodium leucotomos, spironolactone, tamsulosin, tetrahydrozoline, turmeric, cyanocobalamin, and entresto, and the following Facility-Administered Medications: cyanocobalamin. He  reports that he quit smoking about 33 years ago. His smoking use included cigarettes. He has a 50.00 pack-year smoking history. He has never used smokeless tobacco. He reports that he does not drink alcohol and does not use drugs. Matthew [name] is allergic to ranitidine and ranexa [ranolazine er].  BMI: Estimated body mass index is 29.39 kg/m as calculated from the following:   Height as of 03/03/23: 5\' 9"  (1.753 m).   Weight as of 03/03/23: 199 lb (90.3 kg). Last encounter: 02/21/2023. Last procedure: 03/03/2023.  HPI  Today, he is being contacted for a post-procedure assessment.  The patient states that he did not get as much benefit from this procedure as he did from the last one.  This last procedure done on 03/03/2023 was a right-sided lumbar facet block of L4-5 and L5-S1.  The one prior to that on 06/10/2022 was a right intra-articular hip joint injection.  Prior to that, on 07/24/2020 I had done a bilateral lumbar facet block of L3-4, L4-5, and L5-S1 + bilateral sacroiliac joint block.  After the right hip injection he  reported 100% relief of the pain for the duration of the local anesthetic followed by an ongoing 100% relief of the hip pain but only 75% relief of the pain around the area of the right PSIS.  On follow-up after the bilateral lumbar facet and sacroiliac joint block done on 07/24/2020 he indicated having attained 100% relief of the pain for the duration of the local anesthetic which then went down to an overall 75% relief that seems to be ongoing at the time of follow-up on 08/28/2020.  This last time on 03/03/2023 we did not do that left side of his back, we also did not to the right L3-4 facet or the right sacroiliac joint.  This could account for the difference in the results that they have noticed.  Post-procedure evaluation   Procedure: Lumbar Facet, Medial Branch Block(s) #4  Laterality: Right  Level: L3, L4, L5, and S1 Medial Branch Level(s). Injecting these levels blocks the L4-5 and L5-S1 lumbar facet joints. Imaging: Fluoroscopic guidance         Anesthesia: Local anesthesia (1-2% Lidocaine) Anxiolysis: None                 Sedation:                         DOS: 03/03/2023 Performed by: Gaspar Cola, MD  Primary Purpose: Diagnostic/Therapeutic Indications: Low back pain severe enough to impact quality of life or function. 1. Lumbar facet syndrome (Bilateral) (L>R)   2. Lumbar facet osteoarthritis   3. DDD (degenerative disc disease), lumbar   4. Spondylosis without myelopathy or radiculopathy, lumbosacral region   5. Chronic low back pain (Bilateral) w/o sciatica   6. Abnormal MRI, lumbar spine (12/09/2017)    Chronic anticoagulation (Plavix)    NAS-11 Pain score:   Pre-procedure: 7 /10   Post-procedure: 7 /10   Note: The patient has been experiencing right hip pain as well as right lower back pain.  Seen that the hip area  is one of the referral areas to Lumbar facet syndrome, I decided to first do the right lumbar facet block and then wait for the local anesthetic to take  effect before doing the right hip injection.  Approximately 5 to 10 minutes after the right Lumbar facet medial branch block was completed we asked the patient to tell us if he was still experiencing the pain in the right hip.  He indicated that this pain seems to be gone and because of this I canceled the right hip injection.  We have confirmed today is that some of his right hip pain seems to also be coming from the lumbar facet joints.  Today I also went ahead and marked the upper portion of his lumbar pain and x-rayed that limit and this has shown [place] that the primary area of pain seems to be the area of the L4-5 and L5-S1 lumbar facets.  For this reason, today we have limited block to dose to facet joints despite the fact that his MRI shows bilateral facet hypertrophy at the L3-4 level as well.  We will follow-up with the patient in 2 weeks and if it turns out that this procedure gave him incomplete relief of his pain, then we know we will need to extend his treatment to also involve the L3-4.     Effectiveness:  Initial hour after procedure: 100 %. Subsequent 4-6 hours post-procedure: 100 %. Analgesia past initial 6 hours: 50 % (current). Ongoing improvement:  Analgesic: The patient indicates having attained 100% relief of the pain for the duration of the local anesthetic.  He also refers that for approximately 1 week after the procedure he was doing really good, but then the day before yesterday he was spraying some weeds and yesterday he was working with his tractor changing some of the implements and he indicates that the pain started to come back.  He refers still having 100% relief of the leg pain, but the pain that seems to have come back is in the right lower portion of his back. Function: Somewhat improved ROM: Somewhat improved  Pharmacotherapy Assessment  Opioid Analgesic: No chronic opioid analgesics therapy prescribed by our practice. Tramadol 50 mg 1 tablet by mouth every 8 hours (150  mg/day of tramadol) (15 MME/day) MME/day: 15 mg/day.   Monitoring: [place] PMP: PDMP reviewed during this encounter.       Pharmacotherapy: No side-effects or adverse reactions reported. Compliance: No problems identified. Effectiveness: Clinically acceptable. Plan: Refer to "POC". UDS:  Summary  Date Value Ref Range Status  08/31/2017 FINAL  Final    Comment:    ==================================================================== TOXASSURE COMP DRUG ANALYSIS,UR ==================================================================== Test                             Result       Flag       Units Drug Present and Declared for Prescription Verification   Acetaminophen                  PRESENT      EXPECTED Drug Present not Declared for Prescription Verification   Tramadol                       >5102        UNEXPECTED ng/mg creat   O-Desmethyltramadol            5066         UNEXPECTED  ng/mg creat   N-Desmethyltramadol            1382         UNEXPECTED ng/mg creat    Source of tramadol is a prescription medication.    O-desmethyltramadol and N-desmethyltramadol are expected    metabolites of tramadol. Drug Absent but Declared for Prescription Verification   Salicylate                     Not Detected UNEXPECTED    Aspirin, as indicated in the declared medication list, is not    always detected even when used as directed.   Metoprolol                     Not Detected UNEXPECTED ==================================================================== Test                      Result    Flag   Units      Ref Range   Creatinine              98               mg/dL      >=20 ==================================================================== Declared Medications:  The flagging and interpretation on this report are based on the  following declared medications.  Unexpected results may arise from  inaccuracies in the declared medications.  **Note: The testing scope of this panel includes these  medications:  Metoprolol  **Note: The testing scope of this panel does not include small to  moderate amounts of these reported medications:  Acetaminophen  Aspirin  **Note: The testing scope of this panel does not include following  reported medications:  Atorvastatin  Clopidogrel  Hydrochlorothiazide (HCTZ)  Isosorbide Mononitrate  Lisinopril  Multivitamin  Nitroglycerin  Pantoprazole  Tamsulosin ==================================================================== For clinical consultation, please call [phone]. ====================================================================    No results found for: "CBDTHCR", "D8THCCBX", "D9THCCBX"   Laboratory Chemistry Profile   Renal Lab Results  Component Value Date   BUN 17 03/25/2022   CREATININE 1.13 03/25/2022   BCR 9 (L) 01/07/2022   GFRAA >60 09/03/2020   GFRNONAA >60 03/25/2022    Hepatic Lab Results  Component Value Date   AST 25 01/07/2022   ALT 45 (H) 01/07/2022   ALBUMIN 4.5 01/07/2022   ALKPHOS 94 01/07/2022    Electrolytes Lab Results  Component Value Date   NA 136 03/25/2022   K 3.9 03/25/2022   CL 106 03/25/2022   CALCIUM 9.4 03/25/2022   MG 2.0 02/10/2019   PHOS 1.7 (L) 02/10/2019    Bone Lab Results  Component Value Date   25OHVITD1 31 08/31/2017   25OHVITD2 <1.0 08/31/2017   25OHVITD3 31 08/31/2017    Inflammation (CRP: Acute Phase) (ESR: Chronic Phase) Lab Results  Component Value Date   CRP 0.4 08/31/2017   ESRSEDRATE 3 08/31/2017         Note: Above Lab results reviewed.  Imaging  DG PAIN CLINIC C-ARM 1-60 MIN NO REPORT Fluoro was used, but no Radiologist interpretation will be provided.  Please refer to "NOTES" tab for provider progress note.  Assessment  The primary encounter diagnosis was Chronic low back pain (Bilateral) w/o sciatica. Diagnoses of Lumbar facet syndrome (Bilateral) (L>R) and Lumbar facet osteoarthritis were also pertinent to this visit.  Plan of Care   Problem-specific:  No problem-specific Assessment & Plan notes found for this encounter.  Mr. KAYLAN FENGER has a current medication list  which includes the following long-term medication(s): atorvastatin, carvedilol, ezetimibe, isosorbide mononitrate, pantoprazole, and spironolactone.  Pharmacotherapy (Medications Ordered): No orders of the defined types were placed in this encounter.  Orders:  No orders of the defined types were placed in this encounter.  Follow-up plan:   No follow-ups on file.      Interventional Therapies  Risk  Complexity Considerations:   PLAVIX ANTICOAGULATION: (Stop: 7-10 days  Restart: 2 hours)    Planned  Pending:   Therapeutic right (L3-4, L4-5, L5-S1) lumbar facet MBB #5 + right SI block #4 + right IA hip inj. #2    Under consideration:   Therapeutic bilateral lumbar facet MBB #4 + bilateral SI joint Blk #4 (last done 07/24/2020)  Possible bilateral lumbar facet RFA #1 Possible bilateral SI joint RFA #1  Diagnostic left L2 TFESI  Diagnostic bilateral L3 TFESI  Diagnostic left hip injections    Completed:   Diagnostic/therapeutic right IA hip joint inj. x1 (06/10/2022) (100/100/75/100)  Diagnostic right (L3-4, L4-5, L5-S1) lumbar facet MBB x3 (07/24/2020) (100/100/75/75)  Diagnostic right (L4-5, L5-S1) lumbar facet MBB x1 (03/03/2023) (100/100/50/50)  Diagnostic left (L3-4, L4-5, L5-S1) lumbar facet MBB x3 (07/24/2020) (100/100/75/75)  Diagnostic right SI joint Blk x3 (07/24/2020) (100/100/75/75)  Diagnostic left SI joint Blk x3 (07/24/2020) (100/100/75/75)  Therapeutic/palliative left L4-5 LESI x2 (03/09/2018) (100/100/85/75-100)    Therapeutic  Palliative (PRN) options:   Therapeutic lumbar facet MBB   Therapeutic SI joint Blk   Therapeutic/palliative left L4-5 LESI #3        Recent Visits Date Type Provider Dept  03/03/23 Procedure visit Milinda Pointer, MD [facility]  02/21/23 Office Visit Milinda Pointer, MD [facility]  Showing recent visits within past 90 days and meeting all other requirements Today's Visits Date Type Provider Dept  03/17/23 Office Visit Milinda Pointer, MD [facility]  Showing today's visits and meeting all other requirements Future Appointments No visits were found meeting these conditions. Showing future appointments within next 90 days and meeting all other requirements  I discussed the assessment and treatment plan with the patient. The patient was provided an opportunity to ask questions and all were answered. The patient agreed with the plan and demonstrated an understanding of the instructions.  Patient advised to call back or seek an in-person evaluation if the symptoms or condition worsens.  Duration of encounter: 12 minutes.  Note by: Gaspar Cola, MD Date: 03/17/2023; Time: 12:28 PM

## 2023-03-18 ENCOUNTER — Encounter: Payer: Self-pay | Admitting: Pain Medicine

## 2023-04-20 DIAGNOSIS — H353290 Exudative age-related macular degeneration, unspecified eye, stage unspecified: Secondary | ICD-10-CM | POA: Diagnosis not present

## 2023-04-20 DIAGNOSIS — H43813 Vitreous degeneration, bilateral: Secondary | ICD-10-CM | POA: Diagnosis not present

## 2023-04-20 DIAGNOSIS — Z961 Presence of intraocular lens: Secondary | ICD-10-CM | POA: Diagnosis not present

## 2023-04-20 DIAGNOSIS — H353221 Exudative age-related macular degeneration, left eye, with active choroidal neovascularization: Secondary | ICD-10-CM | POA: Diagnosis not present

## 2023-04-26 ENCOUNTER — Ambulatory Visit (INDEPENDENT_AMBULATORY_CARE_PROVIDER_SITE_OTHER): Payer: Medicare Other

## 2023-04-26 DIAGNOSIS — I5022 Chronic systolic (congestive) heart failure: Secondary | ICD-10-CM

## 2023-05-06 LAB — CUP PACEART REMOTE DEVICE CHECK
Battery Remaining Longevity: 31 mo
Battery Remaining Percentage: 43 %
Battery Voltage: 2.98 V
Brady Statistic AP VP Percent: 81 %
Brady Statistic AP VS Percent: 1 %
Brady Statistic AS VP Percent: 14 %
Brady Statistic AS VS Percent: 1 %
Brady Statistic RA Percent Paced: 78 %
Date Time Interrogation Session: 20240514020022
Implantable Lead Connection Status: 753985
Implantable Lead Connection Status: 753985
Implantable Lead Connection Status: 753985
Implantable Lead Implant Date: 20200810
Implantable Lead Implant Date: 20200810
Implantable Lead Implant Date: 20200810
Implantable Lead Location: 753858
Implantable Lead Location: 753859
Implantable Lead Location: 753860
Implantable Lead Model: 5076
Implantable Lead Model: 5076
Implantable Pulse Generator Implant Date: 20200810
Lead Channel Impedance Value: 390 Ohm
Lead Channel Impedance Value: 390 Ohm
Lead Channel Impedance Value: 540 Ohm
Lead Channel Pacing Threshold Amplitude: 0.75 V
Lead Channel Pacing Threshold Amplitude: 1 V
Lead Channel Pacing Threshold Amplitude: 1 V
Lead Channel Pacing Threshold Pulse Width: 0.4 ms
Lead Channel Pacing Threshold Pulse Width: 0.4 ms
Lead Channel Pacing Threshold Pulse Width: 1 ms
Lead Channel Sensing Intrinsic Amplitude: 1.6 mV
Lead Channel Sensing Intrinsic Amplitude: 11.7 mV
Lead Channel Setting Pacing Amplitude: 2 V
Lead Channel Setting Pacing Amplitude: 2 V
Lead Channel Setting Pacing Amplitude: 2 V
Lead Channel Setting Pacing Pulse Width: 0.4 ms
Lead Channel Setting Pacing Pulse Width: 1 ms
Lead Channel Setting Sensing Sensitivity: 2 mV
Pulse Gen Model: 3562
Pulse Gen Serial Number: 9130392

## 2023-05-13 DIAGNOSIS — M5416 Radiculopathy, lumbar region: Secondary | ICD-10-CM | POA: Diagnosis not present

## 2023-05-17 ENCOUNTER — Encounter: Payer: Self-pay | Admitting: Pain Medicine

## 2023-05-19 ENCOUNTER — Ambulatory Visit: Payer: Medicare HMO | Attending: Pain Medicine | Admitting: Pain Medicine

## 2023-05-19 ENCOUNTER — Encounter: Payer: Self-pay | Admitting: Pain Medicine

## 2023-05-19 VITALS — BP 104/65 | HR 71 | Temp 99.1°F | Resp 16 | Ht 69.0 in | Wt 195.0 lb

## 2023-05-19 DIAGNOSIS — M5410 Radiculopathy, site unspecified: Secondary | ICD-10-CM | POA: Diagnosis not present

## 2023-05-19 DIAGNOSIS — M545 Low back pain, unspecified: Secondary | ICD-10-CM | POA: Insufficient documentation

## 2023-05-19 DIAGNOSIS — M5136 Other intervertebral disc degeneration, lumbar region: Secondary | ICD-10-CM | POA: Insufficient documentation

## 2023-05-19 DIAGNOSIS — Z7901 Long term (current) use of anticoagulants: Secondary | ICD-10-CM | POA: Insufficient documentation

## 2023-05-19 DIAGNOSIS — M48062 Spinal stenosis, lumbar region with neurogenic claudication: Secondary | ICD-10-CM | POA: Insufficient documentation

## 2023-05-19 DIAGNOSIS — M79604 Pain in right leg: Secondary | ICD-10-CM | POA: Insufficient documentation

## 2023-05-19 DIAGNOSIS — M79605 Pain in left leg: Secondary | ICD-10-CM | POA: Insufficient documentation

## 2023-05-19 DIAGNOSIS — M25551 Pain in right hip: Secondary | ICD-10-CM | POA: Insufficient documentation

## 2023-05-19 DIAGNOSIS — R937 Abnormal findings on diagnostic imaging of other parts of musculoskeletal system: Secondary | ICD-10-CM | POA: Diagnosis not present

## 2023-05-19 DIAGNOSIS — G8929 Other chronic pain: Secondary | ICD-10-CM | POA: Diagnosis not present

## 2023-05-19 DIAGNOSIS — G4733 Obstructive sleep apnea (adult) (pediatric): Secondary | ICD-10-CM | POA: Insufficient documentation

## 2023-05-19 NOTE — Progress Notes (Signed)
PROVIDER NOTE: Information contained herein reflects review and annotations entered in association with encounter. Interpretation of such information and data should be left to medically-trained personnel. Information provided to patient can be located elsewhere in the medical record under "Patient Instructions". Document created using STT-dictation technology, any transcriptional errors that may result from process are unintentional.    Patient: Matthew [name]  Service Category: E/M  Provider: Oswaldo Done, MD  DOB: 07-14-1936  DOS: 05/19/2023  Referring Provider: Amie Portland, MD  MRN: [id number]  Specialty: Interventional Pain Management  PCP: Matthew Portland, MD  Type: Established Patient  Setting: Ambulatory outpatient    Location: Office  Delivery: Face-to-face     HPI  [name], a 87 y.o. year old male, is here today because of his Chronic bilateral low back pain without sciatica [M54.50, G89.29]. [name] primary complain today is Hip Pain (Right hip )  Pertinent problems: [name] has Chronic low back pain (1ry area of Pain) (Bilateral) (L>R) w/ sciatica (Bilateral); Chronic lower extremity pain (2ry area of Pain) (Bilateral) (L>R); Chronic pain syndrome; Chronic sacroiliac joint pain (Bilateral) (L>R); Chronic hip pain (Right); Neurogenic pain; Lumbar facet syndrome (Bilateral) (L>R); DDD (degenerative disc disease), lumbar; Lumbar facet osteoarthritis; Lumbar spondylosis with radiculopathy; Chronic lower extremity radicular pain (S1 dermatome) (Bilateral) (L>R); Lumbar spinal stenosis (L4-5) (Severe); Abnormal MRI, lumbar spine (12/09/2017); Chronic lumbar radiculopathy; Spondylosis without myelopathy or radiculopathy, lumbosacral region; Other spondylosis, sacral and sacrococcygeal region; Chronic low back pain (Bilateral) w/o sciatica; and Osteoarthritis of hip (Right) on their pertinent problem list. Pain Assessment: Severity of Chronic pain is reported as a 4 /10. Location:  Hip Right/going down the leg to the foot.. Onset: More than a month ago. Quality: Discomfort, Constant, Throbbing. Timing: Constant. Modifying factor(s): tylenol. Vitals:  height is 5\' 9"  (1.753 m) and weight is 195 lb (88.5 kg). His temporal temperature is 99.1 F (37.3 C). His blood pressure is 104/65 and his pulse is 71. His respiration is 16 and oxygen saturation is 97%.  BMI: Estimated body mass index is 28.8 kg/m as calculated from the following:   Height as of this encounter: 5\' 9"  (1.753 m).   Weight as of this encounter: 195 lb (88.5 kg). Last encounter: 03/17/2023. Last procedure: 03/03/2023.  Reason for encounter: evaluation of worsening, or previously known (established) problem.  The patient comes into the clinic today indicating that he was apparently cutting some limbs on a tree and after that, the next day, he started having a flareup of his low back pain (Right) the seem to also go down the right lower extremity to the ankle.  He was then taken to [facility] by his wife and evaluation by the orthopedic surgeons indicate that the patient has a right lower extremity radiculopathy.  Because of that and the fact that he is on Plavix anticoagulation they provided him with a Medrol Dosepak.  The patient has taken the Medrol Dosepak and although it did help with the back pain he is still having the pain down the leg all the way to the ankle.  He denies any pain into the foot.  The patient has a pacemaker that was put in in 2017 but in 2018 we did an MRI of the lumbar spine that demonstrated severe spinal stenosis at the L4-5 level secondary to a right paracentral disc protrusion with evidence of a possible L4 and L5 nerve root compression and subarticular lateral recess stenosis, also on the right side.  The patient denies having had any back surgeries.  When he came in to see [place] his primary area of pain was that of the lower back and therefore we a bilateral lumbar facet block on 11/15/2017 which  provided him with excellent relief of his low back pain.  However, he was still having lower extremity pain and on 01/12/2018 we provided him with a left-sided L4-5 LESI.  This provided him with 100% relief of the pain for the duration of the local anesthetic, followed by a decrease to an 80 to 90% relief of his pain.  Today he is having recurrence of the symptoms and therefore we will plan on repeating the epidural steroid injection, but this time on the right side since all of his symptoms seem to be in that area.  Today he indicates that when he lays flat on his back, after a while this tends to aggravate his leg pain.  He refers that when he then gets up and sits down with his lumbar spine flexed, that pain improves.  This confirms that we are dealing with a spinal stenosis as the canal closest with lumbar extension and opens with flexion.  Because the patient is on Plavix, we will need to stop the anticoagulant for 7 to 10 days, after which we will go ahead and proceed with that right-sided L4-5 LESI.  Once his pain improves and he is able to lay flat on his back, we will go ahead and repeat the lumbar MRI since the last one that we have is from 2018.  Pharmacotherapy Assessment  Analgesic: No chronic opioid analgesics therapy prescribed by our practice. Tramadol 50 mg 1 tablet by mouth every 8 hours (150 mg/day of tramadol) (15 MME/day) MME/day: 15 mg/day.   Monitoring: Matthew [name] PMP: PDMP reviewed during this encounter.       Pharmacotherapy: No side-effects or adverse reactions reported. Compliance: No problems identified. Effectiveness: Clinically acceptable.  Matthew Ammons, RN  05/19/2023  1:51 PM  Sign when Signing Visit Patient reports he was cutting limbs last week at his home and when he went in to lie down his back and leg on the right were in pain.  The back eventually stopped hurting but the leg pain has not gone away.  Wife reports that she took patient to [facility] last Friday and  they perfomred x-rays and prescribed prednisone.  Reports that the hip is improved but is still experiencing pain that can get up an 8 on 0 - 10 pain scale.   Matthew Ammons, RN  05/19/2023  1:47 PM  Sign when Signing Visit Safety precautions to be maintained throughout the outpatient stay will include: orient to surroundings, keep bed in low position, maintain call bell within reach at all times, provide assistance with transfer out of bed and ambulation.     No results found for: "CBDTHCR" No results found for: "D8THCCBX" No results found for: "D9THCCBX"  UDS:  Summary  Date Value Ref Range Status  08/31/2017 FINAL  Final    Comment:    ==================================================================== TOXASSURE COMP DRUG ANALYSIS,UR ==================================================================== Test                             Result       Flag       Units Drug Present and Declared for Prescription Verification   Acetaminophen  PRESENT      EXPECTED Drug Present not Declared for Prescription Verification   Tramadol                       >5102        UNEXPECTED ng/mg creat   O-Desmethyltramadol            5066         UNEXPECTED ng/mg creat   N-Desmethyltramadol            1382         UNEXPECTED ng/mg creat    Source of tramadol is a prescription medication.    O-desmethyltramadol and N-desmethyltramadol are expected    metabolites of tramadol. Drug Absent but Declared for Prescription Verification   Salicylate                     Not Detected UNEXPECTED    Aspirin, as indicated in the declared medication list, is not    always detected even when used as directed.   Metoprolol                     Not Detected UNEXPECTED ==================================================================== Test                      Result    Flag   Units      Ref Range   Creatinine              98               mg/dL       >=86 ==================================================================== Declared Medications:  The flagging and interpretation on this report are based on the  following declared medications.  Unexpected results may arise from  inaccuracies in the declared medications.  **Note: The testing scope of this panel includes these medications:  Metoprolol  **Note: The testing scope of this panel does not include small to  moderate amounts of these reported medications:  Acetaminophen  Aspirin  **Note: The testing scope of this panel does not include following  reported medications:  Atorvastatin  Clopidogrel  Hydrochlorothiazide (HCTZ)  Isosorbide Mononitrate  Lisinopril  Multivitamin  Nitroglycerin  Pantoprazole  Tamsulosin ==================================================================== For clinical consultation, please call [phone]. ====================================================================       ROS  Constitutional: Denies any fever or chills Gastrointestinal: No reported hemesis, hematochezia, vomiting, or acute GI distress Musculoskeletal: Denies any acute onset joint swelling, redness, loss of ROM, or weakness Neurological: No reported episodes of acute onset apraxia, aphasia, dysarthria, agnosia, amnesia, paralysis, loss of coordination, or loss of consciousness  Medication Review  Multiple Vitamins-Minerals, Polypodium Leucotomos, Turmeric, acetaminophen, aspirin EC, atorvastatin, carvedilol, clopidogrel, cyanocobalamin, ezetimibe, isosorbide mononitrate, mupirocin ointment, niacinamide, pantoprazole, sacubitril-valsartan, spironolactone, tamsulosin, and tetrahydrozoline  History Review  Allergy: Matthew [name] is allergic to ranitidine and ranexa [ranolazine er]. Drug: Matthew [name]  reports no history of drug use. Alcohol:  reports no history of alcohol use. Tobacco:  reports that he quit smoking about 33 years ago. His smoking use included cigarettes. He  has a 50.00 pack-year smoking history. He has never used smokeless tobacco. Social: Matthew [name]  reports that he quit smoking about 33 years ago. His smoking use included cigarettes. He has a 50.00 pack-year smoking history. He has never used smokeless tobacco. He reports that he does not drink alcohol and does not use drugs. Medical:  has a past medical history of Alcohol abuse, Anemia,  AS (aortic stenosis), CAD (coronary artery disease), Carotid bruit, Chest pain, GERD (gastroesophageal reflux disease), History of kidney stones, HOH (hard of hearing), HTN (hypertension), Hyperlipidemia, Myocardial infarction (HCC) (1996), Nephrolithiasis, OSA (obstructive sleep apnea), PAD (peripheral artery disease) (HCC) (02/2014), Shortness of breath, Squamous cell carcinoma of skin (12/17/2020), Squamous cell carcinoma of skin (12/17/2020), Squamous cell carcinoma of skin (12/17/2020), and Thrombocytopenia (HCC). Surgical: Mr. Dalpe  has a past surgical history that includes Stomach surgery; Coronary artery bypass graft (1991); Inguinal hernia repair (11/10/2011); left heart catheterization with coronary/graft angiogram (N/A, 03/06/2014); abdominal aortagram (N/A, 03/06/2014); Cardiac catheterization (02/2014); Cardiac catheterization (N/A, 07/31/2015); Cardiac catheterization (N/A, 07/31/2015); Cardiac catheterization (N/A, 05/26/2016); Cysto; Transcatheter aortic valve replacement, transfemoral (N/A, 06/01/2016); TEE without cardioversion (N/A, 06/01/2016); LEFT HEART CATH AND CORONARY ANGIOGRAPHY (Left, 05/15/2018); Esophagogastroduodenoscopy (egd) with propofol (N/A, 10/07/2018); Colonoscopy with propofol (N/A, 10/07/2018); Givens capsule study (N/A, 12/22/2018); enteroscopy (N/A, 12/25/2018); Colonoscopy with propofol (N/A, 01/08/2019); Laparoscopic right colectomy (Right, 02/09/2019); BIV PACEMAKER INSERTION CRT-P (N/A, 07/23/2019); and RIGHT HEART CATH AND CORONARY/GRAFT ANGIOGRAPHY (N/A, 09/05/2020). Family: family history includes  Heart disease in his father and mother.  Laboratory Chemistry Profile   Renal Lab Results  Component Value Date   BUN 17 03/25/2022   CREATININE 1.13 03/25/2022   BCR 9 (L) 01/07/2022   GFRAA >60 09/03/2020   GFRNONAA >60 03/25/2022    Hepatic Lab Results  Component Value Date   AST 25 01/07/2022   ALT 45 (H) 01/07/2022   ALBUMIN 4.5 01/07/2022   ALKPHOS 94 01/07/2022    Electrolytes Lab Results  Component Value Date   NA 136 03/25/2022   K 3.9 03/25/2022   CL 106 03/25/2022   CALCIUM 9.4 03/25/2022   MG 2.0 02/10/2019   PHOS 1.7 (L) 02/10/2019    Bone Lab Results  Component Value Date   25OHVITD1 31 08/31/2017   25OHVITD2 <1.0 08/31/2017   25OHVITD3 31 08/31/2017    Inflammation (CRP: Acute Phase) (ESR: Chronic Phase) Lab Results  Component Value Date   CRP 0.4 08/31/2017   ESRSEDRATE 3 08/31/2017         Note: Above Lab results reviewed.  Recent Imaging Review  CUP PACEART REMOTE DEVICE CHECK Scheduled remote reviewed. Normal device function.   5 AMS, 4-8sec in duration Next remote 91 days. LA, CVRS Note: Reviewed        Physical Exam  General appearance: Well nourished, well developed, and well hydrated. In no apparent acute distress Mental status: Alert, oriented x 3 (person, place, & time)       Respiratory: No evidence of acute respiratory distress Eyes: PERLA Vitals: BP 104/65 (BP Location: Left Arm, Patient Position: Sitting, Cuff Size: Normal)   Pulse 71   Temp 99.1 F (37.3 C) (Temporal)   Resp 16   Ht 5\' 9"  (1.753 m)   Wt 195 lb (88.5 kg)   SpO2 97%   BMI 28.80 kg/m  BMI: Estimated body mass index is 28.8 kg/m as calculated from the following:   Height as of this encounter: 5\' 9"  (1.753 m).   Weight as of this encounter: 195 lb (88.5 kg). Ideal: Ideal body weight: 70.7 kg (155 lb 13.8 oz) Adjusted ideal body weight: 77.8 kg (171 lb 8.3 oz)  Assessment   Diagnosis Status  1. Chronic low back pain (Bilateral) w/o sciatica   2.  Chronic lower extremity pain (2ry area of Pain) (Bilateral) (L>R)   3. Chronic hip pain (Right)   4. Chronic anticoagulation (Plavix)   5. DDD (  degenerative disc disease), lumbar   6. Lumbar spinal stenosis (L4-5) (Severe)   7. Chronic lower extremity radicular pain (S1 dermatome) (Bilateral) (L>R)   8. Abnormal MRI, lumbar spine (12/09/2017)    Controlled Controlled Controlled   Updated Problems: Problem  Lumbar spinal stenosis (L4-5) (Severe)  B12 Deficiency  Essential Hypertension   Qualifier: Diagnosis of  By: Truman Hayward Duncan Dull), Susanne   Obstructive Sleep Apnea of Adult  Benign Prostatic Hyperplasia With Nocturia  History of Kidney Stones  Ischemic Cardiomyopathy  S/P Cabg (Coronary Artery Bypass Graft)  Status Post Insertion of Drug-Eluting Stent into Right Coronary Artery for Coronary Artery Disease  Pacemaker  Chronic Systolic Chf (Congestive Heart Failure) (Hcc)  Htn, Goal Below 140/90  Mixed Hyperlipidemia  Osa On Cpap  Prediabetes    Plan of Care  Problem-specific:  No problem-specific Assessment & Plan notes found for this encounter.  Matthew [name] has a current medication list which includes the following long-term medication(s): atorvastatin, carvedilol, ezetimibe, isosorbide mononitrate, pantoprazole, and spironolactone.  Pharmacotherapy (Medications Ordered): No orders of the defined types were placed in this encounter.  Orders:  No orders of the defined types were placed in this encounter.  Follow-up plan:   Return for [facility]): (R) L4-5 LESI #1, (Blood Thinner Protocol).      Interventional Therapies  Risk  Complexity Considerations:   PLAVIX ANTICOAGULATION: (Stop: 7-10 days  Restart: 2 hours)    Planned  Pending:   Therapeutic right L4-5 LESI #1  Therapeutic right (L3-4, L4-5, L5-S1) lumbar facet MBB #5 + right SI block #4 + right IA hip inj. #2    Under consideration:   Therapeutic bilateral lumbar facet MBB #4 + bilateral SI joint  Blk #4 (last [name] 07/24/2020)  Possible bilateral lumbar facet RFA #1 Possible bilateral SI joint RFA #1  Diagnostic left L2 TFESI  Diagnostic bilateral L3 TFESI  Diagnostic left hip injections    Completed:   Diagnostic/therapeutic right IA hip joint inj. x1 (06/10/2022) (100/100/75/100)  Diagnostic right (L3-4, L4-5, L5-S1) lumbar facet MBB x3 (07/24/2020) (100/100/75/75)  Diagnostic right (L4-5, L5-S1) lumbar facet MBB x1 (03/03/2023) (100/100/50/50)  Diagnostic left (L3-4, L4-5, L5-S1) lumbar facet MBB x3 (07/24/2020) (100/100/75/75)  Diagnostic right SI joint Blk x3 (07/24/2020) (100/100/75/75)  Diagnostic left SI joint Blk x3 (07/24/2020) (100/100/75/75)  Therapeutic/palliative left L4-5 LESI x2 (03/09/2018) (100/100/85/75-100)    Therapeutic  Palliative (PRN) options:   Therapeutic lumbar facet MBB   Therapeutic SI joint Blk   Therapeutic/palliative left L4-5 LESI #3       Recent Visits Date Type Provider Dept  03/17/23 Office Visit Delano Metz, MD [facility]  03/03/23 Procedure visit Delano Metz, MD [facility]  02/21/23 Office Visit Delano Metz, MD [facility]  Showing recent visits within past 90 days and meeting all other requirements Today's Visits Date Type Provider Dept  05/19/23 Office Visit Delano Metz, MD [facility]  Showing today's visits and meeting all other requirements Future Appointments No visits were found meeting these conditions. Showing future appointments within next 90 days and meeting all other requirements  I discussed the assessment and treatment plan with the patient. The patient was provided an opportunity to ask questions and all were answered. The patient agreed with the plan and demonstrated an understanding of the instructions.  Patient advised to call back or seek an in-person evaluation if the symptoms or condition worsens.  Duration of encounter: 30 minutes.  Total time  on encounter, as per [facility]  guidelines included both the face-to-face and non-face-to-face time personally spent by the physician and/or other qualified health care professional(s) on the day of the encounter (includes time in activities that require the physician or other qualified health care professional and does not include time in activities normally performed by clinical staff). Physician's time may include the following activities when performed: Preparing to see the patient (e.g., pre-charting review of records, searching for previously ordered imaging, lab work, and nerve conduction tests) Review of prior analgesic pharmacotherapies. Reviewing PMP Interpreting ordered tests (e.g., lab work, imaging, nerve conduction tests) Performing post-procedure evaluations, including interpretation of diagnostic procedures Obtaining and/or reviewing separately obtained history Performing a medically appropriate examination and/or evaluation Counseling and educating the patient/family/caregiver Ordering medications, tests, or procedures Referring and communicating with other health care professionals (when not separately reported) Documenting clinical information in the electronic or other health record Independently interpreting results (not separately reported) and communicating results to the patient/ family/caregiver Care coordination (not separately reported)  Note by: Matthew Done, MD Date: 05/19/2023; Time: 2:35 PM

## 2023-05-19 NOTE — Progress Notes (Signed)
Safety precautions to be maintained throughout the outpatient stay will include: orient to surroundings, keep bed in low position, maintain call bell within reach at all times, provide assistance with transfer out of bed and ambulation.  

## 2023-05-19 NOTE — Patient Instructions (Addendum)
Tylenol 500 Mg  2 tablets every 6 hour for pain Tylenol PM as directed   Turmeric Pillows for positioning at night     Epidural Steroid Injection Patient Information  Description: The epidural space surrounds the nerves as they exit the spinal cord.  In some patients, the nerves can be compressed and inflamed by a bulging disc or a tight spinal canal (spinal stenosis).  By injecting steroids into the epidural space, we can bring irritated nerves into direct contact with a potentially helpful medication.  These steroids act directly on the irritated nerves and can reduce swelling and inflammation which often leads to decreased pain.  Epidural steroids may be injected anywhere along the spine and from the neck to the low back depending upon the location of your pain.   After numbing the skin with local anesthetic (like Novocaine), a small needle is passed into the epidural space slowly.  You may experience a sensation of pressure while this is being done.  The entire block usually last less than 10 minutes.  Conditions which may be treated by epidural steroids:  Low back and leg pain Neck and arm pain Spinal stenosis Post-laminectomy syndrome Herpes zoster (shingles) pain Pain from compression fractures  Preparation for the injection:  Do not eat any solid food or dairy products within 8 hours of your appointment.  You may drink clear liquids up to 3 hours before appointment.  Clear liquids include water, black coffee, juice or soda.  No milk or cream please. You may take your regular medication, including pain medications, with a sip of water before your appointment  Diabetics should hold regular insulin (if taken separately) and take 1/2 normal NPH dos the morning of the procedure.  Carry some sugar containing items with you to your appointment. A driver must accompany you and be prepared to drive you home after your procedure.  Bring all your current medications with your. An IV may be  inserted and sedation may be given at the discretion of the physician.   A blood pressure cuff, EKG and other monitors will often be applied during the procedure.  Some patients may need to have extra oxygen administered for a short period. You will be asked to provide medical information, including your allergies, prior to the procedure.  We must know immediately if you are taking blood thinners (like Coumadin/Warfarin)  Or if you are allergic to IV iodine contrast (dye). We must know if you could possible be pregnant.  Possible side-effects: Bleeding from needle site Infection (rare, may require surgery) Nerve injury (rare) Numbness & tingling (temporary) Difficulty urinating (rare, temporary) Spinal headache ( a headache worse with upright posture) Light -headedness (temporary) Pain at injection site (several days) Decreased blood pressure (temporary) Weakness in arm/leg (temporary) Pressure sensation in back/neck (temporary)  Call if you experience: Fever/chills associated with headache or increased back/neck pain. Headache worsened by an upright position. New onset weakness or numbness of an extremity below the injection site Hives or difficulty breathing (go to the emergency room) Inflammation or drainage at the infection site Severe back/neck pain Any new symptoms which are concerning to you  Please note:  Although the local anesthetic injected can often make your back or neck feel good for several hours after the injection, the pain will likely return.  It takes 3-7 days for steroids to work in the epidural space.  You may not notice any pain relief for at least that one week.  If effective, we will often  do a series of three injections spaced 3-6 weeks apart to maximally decrease your pain.  After the initial series, we generally will wait several months before considering a repeat injection of the same type.  If you have any questions, please call [phone] [place]  [facility] ______________________________________________________________________  Procedure instructions  Do not eat or drink fluids (other than water) for 6 hours before your procedure  No water for 2 hours before your procedure  Take your blood pressure medicine with a sip of water  Arrive 30 minutes before your appointment  Carefully read the "Preparing for your procedure" detailed instructions  If you have questions call us at [phone]  _____________________________________________________________________    ______________________________________________________________________  Preparing for your procedure  Appointments: If you think you may not be able to keep your appointment, call 24-48 hours in advance to cancel. We need time to make it available to others.  During your procedure appointment there will be: No Prescription Refills. No disability issues to discussed. No medication changes or discussions.  Instructions: Food intake: Avoid eating anything solid for at least 8 hours prior to your procedure. Clear liquid intake: You may take clear liquids such as water up to 2 hours prior to your procedure. (No carbonated drinks. No soda.) Transportation: Unless otherwise stated by your physician, bring a driver. Morning Medicines: Except for blood thinners, take all of your other morning medications with a sip of water. Make sure to take your heart and blood pressure medicines. If your blood pressure's lower number is above 100, the case will be rescheduled. Blood thinners: Make sure to stop your blood thinners as instructed.  If you take a blood thinner, but were not instructed to stop it, call our office [phone] and ask to talk to a nurse. Not stopping a blood thinner prior to certain procedures could lead to serious complications. Diabetics on insulin: Notify the staff so that you can be scheduled 1st case in the morning. If your  diabetes requires high dose insulin, take only  of your normal insulin dose the morning of the procedure and notify the staff that you have done so. Preventing infections: Shower with an antibacterial soap the morning of your procedure.  Build-up your immune system: Take 1000 mg of Vitamin C with every meal (3 times a day) the day prior to your procedure. Antibiotics: Inform the nursing staff if you are taking any antibiotics or if you have any conditions that may require antibiotics prior to procedures. (Example: recent joint implants)   Pregnancy: If you are pregnant make sure to notify the nursing staff. Not doing so may result in injury to the fetus, including death.  Sickness: If you have a cold, fever, or any active infections, call and cancel or reschedule your procedure. Receiving steroids while having an infection may result in complications. Arrival: You must be in the facility at least 30 minutes prior to your scheduled procedure. Tardiness: Your scheduled time is also the cutoff time. If you do not arrive at least 15 minutes prior to your procedure, you will be rescheduled.  Children: Do not bring any children with you. Make arrangements to keep them home. Dress appropriately: There is always a possibility that your clothing may get soiled. Avoid long dresses. Valuables: Do not bring any jewelry or valuables.  Reasons to call and reschedule or cancel your procedure: (Following these recommendations will minimize the risk of a serious complication.) Surgeries: Avoid having procedures within 2 weeks  of any surgery. (Avoid for 2 weeks before or after any surgery). Flu Shots: Avoid having procedures within 2 weeks of a flu shots or . (Avoid for 2 weeks before or after immunizations). Barium: Avoid having a procedure within 7-10 days after having had a radiological study involving the use of radiological contrast. (Myelograms, Barium swallow or enema study). Heart attacks: Avoid any elective  procedures or surgeries for the initial 6 months after a "Myocardial Infarction" (Heart Attack). Blood thinners: It is imperative that you stop these medications before procedures. Let us know if you if you take any blood thinner.  Infection: Avoid procedures during or within two weeks of an infection (including chest colds or gastrointestinal problems). Symptoms associated with infections include: Localized redness, fever, chills, night sweats or profuse sweating, burning sensation when voiding, cough, congestion, stuffiness, runny nose, sore throat, diarrhea, nausea, vomiting, cold or Flu symptoms, recent or current infections. It is specially important if the infection is over the area that we intend to treat. Heart and lung problems: Symptoms that may suggest an active cardiopulmonary problem include: cough, chest pain, breathing difficulties or shortness of breath, dizziness, ankle swelling, uncontrolled high or unusually low blood pressure, and/or palpitations. If you are experiencing any of these symptoms, cancel your procedure and contact your primary care physician for an evaluation.  Remember:  Regular Business hours are:  Monday to Thursday 8:00 AM to 4:00 PM  Provider's Schedule: Delano Metz, MD:  Procedure days: Tuesday and Thursday 7:30 AM to 4:00 PM  Edward Jolly, MD:  Procedure days: Monday and Wednesday 7:30 AM to 4:00 PM  ______________________________________________________________________    ____________________________________________________________________________________________  General Risks and Possible Complications  Patient Responsibilities: It is important that you read this as it is part of your informed consent. It is our duty to inform you of the risks and possible complications associated with treatments offered to you. It is your responsibility as a patient to read this and to ask questions about anything that is not clear or that you believe was not  covered in this document.  Patient's Rights: You have the right to refuse treatment. You also have the right to change your mind, even after initially having agreed to have the treatment done. However, under this last option, if you wait until the last second to change your mind, you may be charged for the materials used up to that point.  Introduction: Medicine is not an Visual merchandiser. Everything in Medicine, including the lack of treatment(s), carries the potential for danger, harm, or loss (which is by definition: Risk). In Medicine, a complication is a secondary problem, condition, or disease that can aggravate an already existing one. All treatments carry the risk of possible complications. The fact that a side effects or complications occurs, does not imply that the treatment was conducted incorrectly. It must be clearly understood that these can happen even when everything is done following the highest safety standards.  No treatment: You can choose not to proceed with the proposed treatment alternative. The "PRO(s)" would include: avoiding the risk of complications associated with the therapy. The "CON(s)" would include: not getting any of the treatment benefits. These benefits fall under one of three categories: diagnostic; therapeutic; and/or palliative. Diagnostic benefits include: getting information which can ultimately lead to improvement of the disease or symptom(s). Therapeutic benefits are those associated with the successful treatment of the disease. Finally, palliative benefits are those related to the decrease of the primary symptoms, without necessarily curing the condition (  example: decreasing the pain from a flare-up of a chronic condition, such as incurable terminal cancer).  General Risks and Complications: These are associated to most interventional treatments. They can occur alone, or in combination. They fall under one of the following six (6) categories: no benefit or worsening  of symptoms; bleeding; infection; nerve damage; allergic reactions; and/or death. No benefits or worsening of symptoms: In Medicine there are no guarantees, only probabilities. No healthcare provider can ever guarantee that a medical treatment will work, they can only state the probability that it may. Furthermore, there is always the possibility that the condition may worsen, either directly, or indirectly, as a consequence of the treatment. Bleeding: This is more common if the patient is taking a blood thinner, either prescription or over the counter (example: Goody Powders, Fish oil, Aspirin, Garlic, etc.), or if suffering a condition associated with impaired coagulation (example: Hemophilia, cirrhosis of the liver, low platelet counts, etc.). However, even if you do not have one on these, it can still happen. If you have any of these conditions, or take one of these drugs, make sure to notify your treating physician. Infection: This is more common in patients with a compromised immune system, either due to disease (example: diabetes, cancer, human immunodeficiency virus [HIV], etc.), or due to medications or treatments (example: therapies used to treat cancer and rheumatological diseases). However, even if you do not have one on these, it can still happen. If you have any of these conditions, or take one of these drugs, make sure to notify your treating physician. Nerve Damage: This is more common when the treatment is an invasive one, but it can also happen with the use of medications, such as those used in the treatment of cancer. The damage can occur to small secondary nerves, or to large primary ones, such as those in the spinal cord and brain. This damage may be temporary or permanent and it may lead to impairments that can range from temporary numbness to permanent paralysis and/or brain death. Allergic Reactions: Any time a substance or material comes in contact with our body, there is the  possibility of an allergic reaction. These can range from a mild skin rash (contact dermatitis) to a severe systemic reaction (anaphylactic reaction), which can result in death. Death: In general, any medical intervention can result in death, most of the time due to an unforeseen complication. ____________________________________________________________________________________________    ____________________________________________________________________________________________  Blood Thinners  IMPORTANT NOTICE:  If you take any of these, make sure to notify the nursing staff.  Failure to do so may result in injury.  Recommended time intervals to stop and restart blood-thinners, before & after invasive procedures  Generic Name Brand Name Pre-procedure. Stop this long before procedure. Post-procedure. Minimum waiting period before restarting.  Abciximab Reopro 15 days 2 hrs  Alteplase Activase 10 days 10 days  Anagrelide Agrylin    Apixaban Eliquis 3 days 6 hrs  Cilostazol Pletal 3 days 5 hrs  Clopidogrel Plavix 7-10 days 2 hrs  Dabigatran Pradaxa 5 days 6 hrs  Dalteparin Fragmin 24 hours 4 hrs  Dipyridamole Aggrenox 11days 2 hrs  Edoxaban Lixiana; Savaysa 3 days 2 hrs  Enoxaparin  Lovenox 24 hours 4 hrs  Eptifibatide Integrillin 8 hours 2 hrs  Fondaparinux  Arixtra 72 hours 12 hrs  Hydroxychloroquine Plaquenil 11 days   Prasugrel Effient 7-10 days 6 hrs  Reteplase Retavase 10 days 10 days  Rivaroxaban Xarelto 3 days 6 hrs  Ticagrelor Brilinta 5-7 days  6 hrs  Ticlopidine Ticlid 10-14 days 2 hrs  Tinzaparin Innohep 24 hours 4 hrs  Tirofiban Aggrastat 8 hours 2 hrs  Warfarin Coumadin 5 days 2 hrs   Other medications with blood-thinning effects  Product indications Generic (Brand) names Note  Cholesterol Lipitor Stop 4 days before procedure  Blood thinner (injectable) Heparin (LMW or LMWH Heparin) Stop 24 hours before procedure  Cancer Ibrutinib (Imbruvica) Stop 7 days before  procedure  Malaria/Rheumatoid Hydroxychloroquine (Plaquenil) Stop 11 days before procedure  Thrombolytics  10 days before or after procedures   Over-the-counter (OTC) Products with blood-thinning effects  Product Common names Stop Time  Aspirin > 325 mg Goody Powders, Excedrin, etc. 11 days  Aspirin ? 81 mg  7 days  Fish oil  4 days  Garlic supplements  7 days  Ginkgo biloba  36 hours  Ginseng  24 hours  NSAIDs Ibuprofen, Naprosyn, etc. 3 days  Vitamin E  4 days   ____________________________________________________________________________________________

## 2023-05-19 NOTE — Progress Notes (Signed)
Patient reports he was cutting limbs last week at his home and when he went in to lie down his back and leg on the right were in pain.  The back eventually stopped hurting but the leg pain has not gone away.  Wife reports that she took patient to [facility] last Friday and they perfomred x-rays and prescribed prednisone.  Reports that the hip is improved but is still experiencing pain that can get up an 8 on 0 - 10 pain scale.

## 2023-05-23 NOTE — Progress Notes (Signed)
Remote pacemaker transmission.   

## 2023-05-24 ENCOUNTER — Other Ambulatory Visit: Payer: Self-pay | Admitting: Pain Medicine

## 2023-05-24 DIAGNOSIS — G8929 Other chronic pain: Secondary | ICD-10-CM

## 2023-05-24 DIAGNOSIS — M48062 Spinal stenosis, lumbar region with neurogenic claudication: Secondary | ICD-10-CM

## 2023-05-24 DIAGNOSIS — M5136 Other intervertebral disc degeneration, lumbar region: Secondary | ICD-10-CM

## 2023-05-25 ENCOUNTER — Telehealth: Payer: Self-pay | Admitting: Pain Medicine

## 2023-05-25 NOTE — Telephone Encounter (Signed)
Matthew [name] at [facility] [phone] ext [phone] has some questions regarding the request to stop blood thinners. Please call her

## 2023-05-25 NOTE — Telephone Encounter (Signed)
This message is on the incorrect patient. I addressed the issue on the correct patient.

## 2023-05-26 DIAGNOSIS — N400 Enlarged prostate without lower urinary tract symptoms: Secondary | ICD-10-CM | POA: Diagnosis not present

## 2023-05-26 DIAGNOSIS — E785 Hyperlipidemia, unspecified: Secondary | ICD-10-CM | POA: Diagnosis not present

## 2023-05-26 DIAGNOSIS — I110 Hypertensive heart disease with heart failure: Secondary | ICD-10-CM | POA: Diagnosis not present

## 2023-05-26 DIAGNOSIS — E538 Deficiency of other specified B group vitamins: Secondary | ICD-10-CM | POA: Diagnosis not present

## 2023-05-26 DIAGNOSIS — I509 Heart failure, unspecified: Secondary | ICD-10-CM | POA: Diagnosis not present

## 2023-05-30 NOTE — Progress Notes (Unsigned)
PROVIDER NOTE: Interpretation of information contained herein should be left to medically-trained personnel. Specific patient instructions are provided elsewhere under "Patient Instructions" section of medical record. This document was created in part using STT-dictation technology, any transcriptional errors that may result from this process are unintentional.  Patient: Matthew [name] Type: Established DOB: [birth year] MRN: [id number] PCP: Amie Portland, MD  Service: Procedure DOS: 05/31/2023 Setting: Ambulatory Location: Ambulatory outpatient facility Delivery: Face-to-face Provider: Oswaldo Done, MD Specialty: Interventional Pain Management Specialty designation: 09 Location: Outpatient facility Ref. Prov.: Delano Metz, MD       Interventional Therapy   Procedure: Lumbar epidural steroid injection (LESI) (interlaminar) #1    Laterality: Right   Level:  L4-5 Level.  Imaging: Fluoroscopic guidance         Anesthesia: Local anesthesia (1-2% Lidocaine) Anxiolysis: None                 Sedation: No Sedation                       DOS: 05/31/2023  Performed by: Oswaldo Done, MD  Purpose: Diagnostic/Therapeutic Indications: Lumbar radicular pain of intraspinal etiology of more than 4 weeks that has failed to respond to conservative therapy and is severe enough to impact quality of life or function. 1. Chronic lumbar radiculopathy   2. Lumbar spinal stenosis (L4-5) (Severe)   3. Chronic lower extremity radicular pain (S1 dermatome) (Bilateral) (L>R)   4. Chronic low back pain (1ry area of Pain) (Bilateral) (L>R) w/ sciatica (Bilateral)   5. Chronic lower extremity pain (2ry area of Pain) (Bilateral) (L>R)   6. DDD (degenerative disc disease), lumbar   7. Lumbar spondylosis with radiculopathy   8. Chronic anticoagulation (Plavix)    NAS-11 Pain score:   Pre-procedure: 6 /10   Post-procedure: 1 /10      Position / Prep / Materials:  Position: Prone w/ head of the  table raised (slight reverse trendelenburg) to facilitate breathing.  Prep solution: DuraPrep (Iodine Povacrylex [0.7% available iodine] and Isopropyl Alcohol, 74% w/w) Prep Area: Entire Posterior Lumbar Region from lower scapular tip down to mid buttocks area and from flank to flank. Materials:  Tray: Epidural tray Needle(s):  Type: Epidural needle (Tuohy) Gauge (G):  17 Length: Regular (3.5-in) Qty: 1   Pre-op H&P Assessment:  Matthew [name] is a 87 y.o. (year old), male patient, seen today for interventional treatment. 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 Matthew [name] has a current medication list which includes the following prescription(s): acetaminophen, amitriptyline, aspirin ec, atorvastatin, carvedilol, clopidogrel, ezetimibe, isosorbide mononitrate, multiple vitamins-minerals, mupirocin ointment, niacinamide, pantoprazole, polypodium leucotomos, entresto, spironolactone, tamsulosin, tetrahydrozoline, turmeric, and cyanocobalamin, and the following Facility-Administered  Medications: cyanocobalamin. His primarily concern today is the Hip Pain (right)  Initial Vital Signs:  Pulse/HCG Rate: 70  Temp: (!) 97.2 F (36.2 C) Resp: 20  BP: (!) 166/82 SpO2: 97 %  BMI: Estimated body mass index is 28.8 kg/m as calculated from the following:   Height as of this encounter: 5\' 9"  (1.753 m).   Weight as of this encounter: 195 lb (88.5 kg).  Risk Assessment: Allergies: Reviewed. He is allergic to ranitidine and ranexa [ranolazine er].  Allergy Precautions: None required Coagulopathies: Reviewed. None identified.  Blood-thinner therapy: None at this time Active Infection(s): Reviewed. None identified. Mr. Barranco is afebrile  Site Confirmation: Matthew [name] was asked to confirm the procedure and laterality before marking the site Procedure checklist: Completed Consent: Before the procedure and under the influence of no sedative(s), amnesic(s), or anxiolytics, the patient was informed of the treatment options, risks and possible complications. To fulfill our ethical and legal obligations, as recommended by the American Medical Association's Code of Ethics, I have informed the patient of my clinical impression; the nature and purpose of the treatment or procedure; the risks, benefits, and possible complications of the intervention; the alternatives, including doing nothing; the risk(s) and benefit(s) of the alternative treatment(s) or procedure(s); and the risk(s) and benefit(s) of doing nothing. The patient was provided information about the general risks and possible complications associated with the procedure. These may include, but are not limited to: failure to achieve desired goals, infection, bleeding, organ or nerve damage, allergic reactions, paralysis, and death. In addition, the patient was informed of those risks and complications associated to Spine-related procedures, such as failure to decrease pain; infection (i.e.: Meningitis, epidural or intraspinal abscess);  bleeding (i.e.: epidural hematoma, subarachnoid hemorrhage, or any other type of intraspinal or peri-dural bleeding); organ or nerve damage (i.e.: Any type of peripheral nerve, nerve root, or spinal cord injury) with subsequent damage to sensory, motor, and/or autonomic systems, resulting in permanent pain, numbness, and/or weakness of one or several areas of the body; allergic reactions; (i.e.: anaphylactic reaction); and/or death. Furthermore, the patient was informed of those risks and complications associated with the medications. These include, but are not limited to: allergic reactions (i.e.: anaphylactic or anaphylactoid reaction(s)); adrenal axis suppression; blood sugar elevation that in diabetics may result in ketoacidosis or comma; water retention that in patients with history of congestive heart failure may result in shortness of breath, pulmonary edema, and decompensation with resultant heart failure; weight gain; swelling or edema; medication-induced neural toxicity; particulate matter embolism and blood vessel occlusion with resultant organ, and/or nervous system infarction; and/or aseptic necrosis of one or more joints. Finally, the patient was informed that Medicine is not an exact science; therefore, there is also the possibility of unforeseen or unpredictable risks and/or possible complications that may result in a catastrophic outcome. The patient indicated having understood very clearly. We have given the patient no guarantees and we have made no promises. Enough time was given to the patient to ask questions, all of which were answered to the patient's satisfaction. Mr. Boltz has indicated that he wanted to continue with the procedure. Attestation: I, the ordering provider, attest that I have discussed with the patient the benefits, risks, side-effects, alternatives, likelihood of achieving goals, and potential problems during recovery for the procedure that I have provided informed  consent. Date  Time: 05/31/2023 11:22 AM   Pre-Procedure Preparation:  Monitoring: As per clinic protocol. Respiration, ETCO2, SpO2, BP, heart rate and rhythm monitor placed and checked for adequate function Safety Precautions: Patient was assessed for positional comfort and pressure points before starting the procedure. Time-out: I initiated and conducted the "Time-out" before starting the  procedure, as per protocol. The patient was asked to participate by confirming the accuracy of the "Time Out" information. Verification of the correct person, site, and procedure were performed and confirmed by me, the nursing staff, and the patient. "Time-out" conducted as per Joint Commission's Universal Protocol (UP.01.01.01). Time: 1201 Start Time: 1201 hrs.  Description/Narrative of Procedure:          Target: Epidural space via interlaminar opening, initially targeting the lower laminar border of the superior vertebral body. Region: Lumbar Approach: Percutaneous paravertebral  Rationale (medical necessity): procedure needed and proper for the diagnosis and/or treatment of the patient's medical symptoms and needs. Procedural Technique Safety Precautions: Aspiration looking for blood return was conducted prior to all injections. At no point did we inject any substances, as a needle was being advanced. No attempts were made at seeking any paresthesias. Safe injection practices and needle disposal techniques used. Medications properly checked for expiration dates. SDV (single dose vial) medications used. Description of the Procedure: Protocol guidelines were followed. The procedure needle was introduced through the skin, ipsilateral to the reported pain, and advanced to the target area. Bone was contacted and the needle walked caudad, until the lamina was cleared. The epidural space was identified using "loss-of-resistance technique" with 2-3 ml of PF-NaCl (0.9% NSS), in a 5cc LOR glass syringe.  Vitals:    05/31/23 1121 05/31/23 1200 05/31/23 1207  BP: (!) 166/82 (!) 154/92 (!) 156/89  Pulse: 70 70 68  Resp:  20 19  Temp: (!) 97.2 F (36.2 C)    TempSrc: Temporal    SpO2: 97% 96% 97%  Weight: 195 lb (88.5 kg)    Height: 5\' 9"  (1.753 m)     Start Time: 1201 hrs. End Time: 1205 hrs.  Imaging Guidance (Spinal):          Type of Imaging Technique: Fluoroscopy Guidance (Spinal) Indication(s): Assistance in needle guidance and placement for procedures requiring needle placement in or near specific anatomical locations not easily accessible without such assistance. Exposure Time: Please see nurses notes. Contrast: Before injecting any contrast, we confirmed that the patient did not have an allergy to iodine, shellfish, or radiological contrast. Once satisfactory needle placement was completed at the desired level, radiological contrast was injected. Contrast injected under live fluoroscopy. No contrast complications. See chart for type and volume of contrast used. Fluoroscopic Guidance: I was personally present during the use of fluoroscopy. "Tunnel Vision Technique" used to obtain the best possible view of the target area. Parallax error corrected before commencing the procedure. "Direction-depth-direction" technique used to introduce the needle under continuous pulsed fluoroscopy. Once target was reached, antero-posterior, oblique, and lateral fluoroscopic projection used confirm needle placement in all planes. Images permanently stored in EMR. Interpretation: I personally interpreted the imaging intraoperatively. Adequate needle placement confirmed in multiple planes. Appropriate spread of contrast into desired area was observed. No evidence of afferent or efferent intravascular uptake. No intrathecal or subarachnoid spread observed. Permanent images saved into the patient's record.  Antibiotic Prophylaxis:   Anti-infectives (From admission, onward)    None      Indication(s): None  identified  Post-operative Assessment:  Post-procedure Vital Signs:  Pulse/HCG Rate: 68  Temp: (!) 97.2 F (36.2 C) Resp: 19 BP: (!) 156/89 SpO2: 97 %  EBL: None  Complications: No immediate post-treatment complications observed by team, or reported by patient.  Note: The patient tolerated the entire procedure well. A repeat set of vitals were taken after the procedure and the patient was kept  under observation following institutional policy, for this type of procedure. Post-procedural neurological assessment was performed, showing return to baseline, prior to discharge. The patient was provided with post-procedure discharge instructions, including a section on how to identify potential problems. Should any problems arise concerning this procedure, the patient was given instructions to immediately contact us, at any time, without hesitation. In any case, we plan to contact the patient by telephone for a follow-up status report regarding this interventional procedure.  Comments:  No additional relevant information.  Plan of Care (POC)  Orders:  Orders Placed This Encounter  Procedures   Lumbar Epidural Injection    Scheduling Instructions:     Procedure: Interlaminar LESI L4-5     Laterality: Right     Sedation: Patient's choice     Timeframe: Today    Order Specific Question:   Where will this procedure be performed?    Answer:   [facility]   DG [facility]IN [facility] C-ARM 1-60 MIN NO REPORT    Intraoperative interpretation by procedural physician at [facility].    Standing Status:   Standing    Number of Occurrences:   1    Order Specific Question:   Reason for exam:    Answer:   Assistance in needle guidance and placement for procedures requiring needle placement in or near specific anatomical locations not easily accessible without such assistance.   Informed Consent Details: Physician/Practitioner Attestation; Transcribe to consent form and obtain patient  signature    Note: Always confirm laterality of pain with Mr. Odowd, before procedure. Transcribe to consent form and obtain patient signature.    Order Specific Question:   Physician/Practitioner attestation of informed consent for procedure/surgical case    Answer:   I, the physician/practitioner, attest that I have discussed with the patient the benefits, risks, side effects, alternatives, likelihood of achieving goals and potential problems during recovery for the procedure that I have provided informed consent.    Order Specific Question:   Procedure    Answer:   Lumbar epidural steroid injection under fluoroscopic guidance    Order Specific Question:   Physician/Practitioner performing the procedure    Answer:   Welden Hausmann A. Laban Emperor, MD    Order Specific Question:   Indication/Reason    Answer:   Low back and/or lower extremity pain secondary to lumbar radiculitis   Care order/instruction: Please confirm that the patient has stopped the Plavix (Clopidogrel) x 7-10 days prior to procedure or surgery.    Please confirm that the patient has stopped the Plavix (Clopidogrel) x 7-10 days prior to procedure or surgery.    Standing Status:   Standing    Number of Occurrences:   1   Provide equipment / supplies at bedside    Procedural tray: Epidural Tray (Disposable  single use) Skin infiltration needle: Regular 1.5-in, 25-G, (x1) Block needle size: Regular standard Catheter: No catheter required    Standing Status:   Standing    Number of Occurrences:   1    Order Specific Question:   Specify    Answer:   Epidural Tray   Follow-up    Schedule Mr. Zavatsky for a post-procedure follow-up evaluation encounter 2 weeks from now.    Standing Status:   Future    Standing Expiration Date:   06/14/2023    Scheduling Instructions:     Schedule follow-up visit on afternoon of procedure day (T, Th)     Type: Face-to-face (F2F) Post-procedure (PP) evaluation (E/M)  When: 2 weeks from now   Bleeding  precautions    Standing Status:   Standing    Number of Occurrences:   1   Chronic Opioid Analgesic:  No chronic opioid analgesics therapy prescribed by our practice. Tramadol 50 mg 1 tablet by mouth every 8 hours (150 mg/day of tramadol) (15 MME/day) MME/day: 15 mg/day.   Medications ordered for procedure: Meds ordered this encounter  Medications   iohexol (OMNIPAQUE) 180 MG/ML injection 10 mL    Must be Myelogram-compatible. If not available, you may substitute with a water-soluble, non-ionic, hypoallergenic, myelogram-compatible radiological contrast medium.   lidocaine (XYLOCAINE) 2 % (with pres) injection 400 mg   pentafluoroprop-tetrafluoroeth (GEBAUERS) aerosol   sodium chloride flush (NS) 0.9 % injection 2 mL   ropivacaine (PF) 2 mg/mL (0.2%) (NAROPIN) injection 2 mL   triamcinolone acetonide (KENALOG-40) injection 40 mg   Medications administered: We administered iohexol, lidocaine, pentafluoroprop-tetrafluoroeth, sodium chloride flush, ropivacaine (PF) 2 mg/mL (0.2%), and triamcinolone acetonide.  See the medical record for exact dosing, route, and time of administration.  Follow-up plan:   Return in about 2 weeks (around 06/14/2023) for Proc-day (T,Th), (Face2F), (PPE).       Interventional Therapies  Risk  Complexity Considerations:   PLAVIX ANTICOAGULATION: (Stop: 7-10 days  Restart: 2 hours)    Planned  Pending:   Therapeutic right L4-5 LESI #1  Therapeutic right (L3-4, L4-5, L5-S1) lumbar facet MBB #5 + right SI block #4 + right IA hip inj. #2    Under consideration:   Therapeutic bilateral lumbar facet MBB #4 + bilateral SI joint Blk #4 (last done 07/24/2020)  Possible bilateral lumbar facet RFA #1 Possible bilateral SI joint RFA #1  Diagnostic left L2 TFESI  Diagnostic bilateral L3 TFESI  Diagnostic left hip injections    Completed:   Diagnostic/therapeutic right IA hip joint inj. x1 (06/10/2022) (100/100/75/100)  Diagnostic right (L3-4, L4-5, L5-S1) lumbar  facet MBB x3 (07/24/2020) (100/100/75/75)  Diagnostic right (L4-5, L5-S1) lumbar facet MBB x1 (03/03/2023) (100/100/50/50)  Diagnostic left (L3-4, L4-5, L5-S1) lumbar facet MBB x3 (07/24/2020) (100/100/75/75)  Diagnostic right SI joint Blk x3 (07/24/2020) (100/100/75/75)  Diagnostic left SI joint Blk x3 (07/24/2020) (100/100/75/75)  Therapeutic/palliative left L4-5 LESI x2 (03/09/2018) (100/100/85/75-100)    Therapeutic  Palliative (PRN) options:   Therapeutic lumbar facet MBB   Therapeutic SI joint Blk   Therapeutic/palliative left L4-5 LESI #3        Recent Visits Date Type Provider Dept  05/19/23 Office Visit Delano Metz, MD [facility]  03/17/23 Office Visit Delano Metz, MD [facility]  03/03/23 Procedure visit Delano Metz, MD [facility]  Showing recent visits within past 90 days and meeting all other requirements Today's Visits Date Type Provider Dept  05/31/23 Procedure visit Delano Metz, MD [facility]  Showing today's visits and meeting all other requirements Future Appointments No visits were found meeting these conditions. Showing future appointments within next 90 days and meeting all other requirements  Disposition: Discharge home  Discharge (Date  Time): 05/31/2023; 1209 hrs.   Primary Care Physician: Amie Portland, MD Location: [facility] Note by: Oswaldo Done, MD (TTS technology used. I apologize for any typographical errors that were not detected and corrected.) Date: 05/31/2023; Time: 12:10 PM  Disclaimer:  Medicine is not an Visual merchandiser. The only guarantee in medicine is that nothing is guaranteed. It is important to note that the decision to proceed with this intervention was based  on the information collected from the patient. The Data and conclusions were drawn from the patient's questionnaire, the interview, and the physical examination. Because the  information was provided in large part by the patient, it cannot be guaranteed that it has not been purposely or unconsciously manipulated. Every effort has been made to obtain as much relevant data as possible for this evaluation. It is important to note that the conclusions that lead to this procedure are derived in large part from the available data. Always take into account that the treatment will also be dependent on availability of resources and existing treatment guidelines, considered by other Pain Management Practitioners as being common knowledge and practice, at the time of the intervention. For Medico-Legal purposes, it is also important to point out that variation in procedural techniques and pharmacological choices are the acceptable norm. The indications, contraindications, technique, and results of the above procedure should only be interpreted and judged by a Board-Certified Interventional Pain Specialist with extensive familiarity and expertise in the same exact procedure and technique.

## 2023-05-31 ENCOUNTER — Ambulatory Visit: Payer: Medicare HMO | Attending: Pain Medicine | Admitting: Pain Medicine

## 2023-05-31 ENCOUNTER — Encounter: Payer: Self-pay | Admitting: Pain Medicine

## 2023-05-31 ENCOUNTER — Ambulatory Visit
Admission: RE | Admit: 2023-05-31 | Discharge: 2023-05-31 | Disposition: A | Discharge status: Home / Self Care | Payer: Medicare HMO | Source: Ambulatory Visit | Attending: Pain Medicine | Admitting: Pain Medicine

## 2023-05-31 VITALS — BP 156/89 | HR 68 | Temp 97.2°F | Resp 19 | Ht 69.0 in | Wt 195.0 lb

## 2023-05-31 DIAGNOSIS — M48062 Spinal stenosis, lumbar region with neurogenic claudication: Secondary | ICD-10-CM | POA: Insufficient documentation

## 2023-05-31 DIAGNOSIS — M4726 Other spondylosis with radiculopathy, lumbar region: Secondary | ICD-10-CM | POA: Insufficient documentation

## 2023-05-31 DIAGNOSIS — M5410 Radiculopathy, site unspecified: Secondary | ICD-10-CM | POA: Diagnosis not present

## 2023-05-31 DIAGNOSIS — Z5189 Encounter for other specified aftercare: Secondary | ICD-10-CM | POA: Insufficient documentation

## 2023-05-31 DIAGNOSIS — Z7901 Long term (current) use of anticoagulants: Secondary | ICD-10-CM | POA: Diagnosis present

## 2023-05-31 DIAGNOSIS — M5416 Radiculopathy, lumbar region: Secondary | ICD-10-CM | POA: Diagnosis not present

## 2023-05-31 DIAGNOSIS — M79605 Pain in left leg: Secondary | ICD-10-CM | POA: Diagnosis not present

## 2023-05-31 DIAGNOSIS — G8929 Other chronic pain: Secondary | ICD-10-CM | POA: Insufficient documentation

## 2023-05-31 DIAGNOSIS — M79604 Pain in right leg: Secondary | ICD-10-CM | POA: Insufficient documentation

## 2023-05-31 DIAGNOSIS — M5136 Other intervertebral disc degeneration, lumbar region: Secondary | ICD-10-CM | POA: Insufficient documentation

## 2023-05-31 DIAGNOSIS — M5442 Lumbago with sciatica, left side: Secondary | ICD-10-CM | POA: Insufficient documentation

## 2023-05-31 DIAGNOSIS — M5441 Lumbago with sciatica, right side: Secondary | ICD-10-CM | POA: Insufficient documentation

## 2023-05-31 MED ORDER — LIDOCAINE HCL 2 % IJ SOLN
20.0000 mL | Freq: Once | INTRAMUSCULAR | Status: AC
  Administered 2023-05-31: 400 mg
  Filled 2023-05-31: qty 20

## 2023-05-31 MED ORDER — TRIAMCINOLONE ACETONIDE 40 MG/ML IJ SUSP
40.0000 mg | Freq: Once | INTRAMUSCULAR | Status: AC
  Administered 2023-05-31: 40 mg
  Filled 2023-05-31: qty 1

## 2023-05-31 MED ORDER — SODIUM CHLORIDE 0.9% FLUSH
2.0000 mL | Freq: Once | INTRAVENOUS | Status: AC
  Administered 2023-05-31: 2 mL

## 2023-05-31 MED ORDER — ROPIVACAINE HCL 2 MG/ML IJ SOLN
2.0000 mL | Freq: Once | INTRAMUSCULAR | Status: AC
  Administered 2023-05-31: 2 mL via EPIDURAL
  Filled 2023-05-31: qty 20

## 2023-05-31 MED ORDER — PENTAFLUOROPROP-TETRAFLUOROETH EX AERO
INHALATION_SPRAY | Freq: Once | CUTANEOUS | Status: AC
  Administered 2023-05-31: 30 via TOPICAL
  Filled 2023-05-31: qty 116

## 2023-05-31 MED ORDER — IOHEXOL 180 MG/ML  SOLN
10.0000 mL | Freq: Once | INTRAMUSCULAR | Status: AC
  Administered 2023-05-31: 10 mL via EPIDURAL
  Filled 2023-05-31: qty 20

## 2023-05-31 NOTE — Patient Instructions (Signed)

## 2023-06-01 ENCOUNTER — Telehealth: Payer: Self-pay

## 2023-06-01 NOTE — Telephone Encounter (Signed)
Post procedure follow up.  Daughter states he is at home sleeping and for [place] to give him a couple hours before we call him.

## 2023-06-20 ENCOUNTER — Encounter: Payer: Self-pay | Admitting: Pain Medicine

## 2023-06-21 ENCOUNTER — Encounter: Payer: Self-pay | Admitting: Pain Medicine

## 2023-06-21 ENCOUNTER — Ambulatory Visit: Payer: Medicare HMO | Attending: Pain Medicine | Admitting: Pain Medicine

## 2023-06-21 VITALS — BP 114/74 | HR 74 | Temp 97.8°F | Resp 16 | Ht 69.0 in | Wt 195.0 lb

## 2023-06-21 DIAGNOSIS — M5410 Radiculopathy, site unspecified: Secondary | ICD-10-CM | POA: Insufficient documentation

## 2023-06-21 DIAGNOSIS — Z09 Encounter for follow-up examination after completed treatment for conditions other than malignant neoplasm: Secondary | ICD-10-CM | POA: Diagnosis not present

## 2023-06-21 DIAGNOSIS — M5416 Radiculopathy, lumbar region: Secondary | ICD-10-CM | POA: Diagnosis not present

## 2023-06-21 DIAGNOSIS — M79604 Pain in right leg: Secondary | ICD-10-CM | POA: Insufficient documentation

## 2023-06-21 DIAGNOSIS — M48062 Spinal stenosis, lumbar region with neurogenic claudication: Secondary | ICD-10-CM | POA: Diagnosis not present

## 2023-06-21 DIAGNOSIS — M5136 Other intervertebral disc degeneration, lumbar region: Secondary | ICD-10-CM | POA: Insufficient documentation

## 2023-06-21 DIAGNOSIS — G8929 Other chronic pain: Secondary | ICD-10-CM | POA: Insufficient documentation

## 2023-06-21 DIAGNOSIS — M25551 Pain in right hip: Secondary | ICD-10-CM | POA: Diagnosis not present

## 2023-06-21 DIAGNOSIS — M5442 Lumbago with sciatica, left side: Secondary | ICD-10-CM | POA: Insufficient documentation

## 2023-06-21 DIAGNOSIS — M5441 Lumbago with sciatica, right side: Secondary | ICD-10-CM | POA: Insufficient documentation

## 2023-06-21 DIAGNOSIS — R937 Abnormal findings on diagnostic imaging of other parts of musculoskeletal system: Secondary | ICD-10-CM | POA: Diagnosis not present

## 2023-06-21 DIAGNOSIS — M79605 Pain in left leg: Secondary | ICD-10-CM | POA: Diagnosis not present

## 2023-06-21 NOTE — Progress Notes (Signed)
Safety precautions to be maintained throughout the outpatient stay will include: orient to surroundings, keep bed in low position, maintain call bell within reach at all times, provide assistance with transfer out of bed and ambulation.  

## 2023-06-21 NOTE — Patient Instructions (Signed)

## 2023-06-21 NOTE — Progress Notes (Signed)
PROVIDER NOTE: Information contained herein reflects review and annotations entered in association with encounter. Interpretation of such information and data should be left to medically-trained personnel. Information provided to patient can be located elsewhere in the medical record under "Patient Instructions". Document created using STT-dictation technology, any transcriptional errors that may result from process are unintentional.    Patient: Matthew [name]  Service Category: E/M  Provider: Oswaldo Done, MD  DOB: 01-05-1936  DOS: 06/21/2023  Referring Provider: Amie Portland, MD  MRN: [id number]  Specialty: Interventional Pain Management  PCP: Amie Portland, MD  Type: Established Patient  Setting: Ambulatory outpatient    Location: Office  Delivery: Face-to-face     HPI  Mr. ENRRIQUE MIERZWA, a 87 y.o. year old male, is here today because of his Chronic lumbar radiculopathy [M54.16]. Mr. Blossom primary complain today is Back Pain (Lumbar bilateral )  Pertinent problems: Mr. Risdon has Chronic low back pain (1ry area of Pain) (Bilateral) (L>R) w/ sciatica (Bilateral); Chronic lower extremity pain (2ry area of Pain) (Bilateral) (L>R); Chronic pain syndrome; Chronic sacroiliac joint pain (Bilateral) (L>R); Chronic hip pain (Right); Neurogenic pain; Lumbar facet syndrome (Bilateral) (L>R); DDD (degenerative disc disease), lumbar; Lumbar facet osteoarthritis; Lumbar spondylosis with radiculopathy; Chronic lower extremity radicular pain (S1 dermatome) (Bilateral) (L>R); Lumbar spinal stenosis (L4-5) (Severe); Abnormal MRI, lumbar spine (12/09/2017); Chronic lumbar radiculopathy; Spondylosis without myelopathy or radiculopathy, lumbosacral region; Other spondylosis, sacral and sacrococcygeal region; Chronic low back pain (Bilateral) w/o sciatica; and Osteoarthritis of hip (Right) on their pertinent problem list. Pain Assessment: Severity of Chronic pain is reported as a 3 /10. Location: Back Lower, Left,  Right/down the right leg to the calf.  also reports that pain comes up from ankle to the knee on the right. Onset: More than a month ago. Quality: Discomfort, Constant. Timing: Constant. Modifying factor(s): Tylenol, supplements, turmeric, Tylenol pm qhs. Vitals:  height is 5\' 9"  (1.753 m) and weight is 195 lb (88.5 kg). His temporal temperature is 97.8 F (36.6 C). His blood pressure is 114/74 and his pulse is 74. His respiration is 16 and oxygen saturation is 98%.  BMI: Estimated body mass index is 28.8 kg/m as calculated from the following:   Height as of this encounter: 5\' 9"  (1.753 m).   Weight as of this encounter: 195 lb (88.5 kg). Last encounter: 05/19/2023. Last procedure: 05/31/2023.  Reason for encounter: post-procedure evaluation and assessment.  The patient indicates not having had any significant benefit from the lumbar epidural steroid injection.  However, he continues to have pain in the area of the right groin and he also states that this pain runs over the anterior thigh and the medial aspect of his distal leg and what could be an L4 dermatomal distribution.  Review of the medical record shows his last lumbar MRI to have been done around 2018 and at this point it seems to me that we need to have that repeated.  That prior MRI done on 12/09/2017 was positive for mild spinal stenosis at the L2-3 level with subarticular stenosis on the left with impingement of the left L3 nerve root by 2018.  At that time there was also moderate spinal stenosis at the L3-4 level with subarticular and foraminal stenosis bilaterally.  Interestingly by that time the patient also was having a moderately large right paracentral disc protrusion at the L4-5 level with severe spinal stenosis and marked compression of the right L4 and L5 nerve roots with moderate subarticular foraminal stenosis  on the left side.  According to our records he has not had this severe spinal stenosis surgically corrected.  At this point,  since his symptoms seem to be worsening I do believe it is medically necessary for him to undergo a repeat lumbar MRI to assess his current condition since that last MRI was done approximately 6 years ago.  Post-procedure evaluation   Procedure: Lumbar epidural steroid injection (LESI) (interlaminar) #1    Laterality: Right   Level:  L4-5 Level.  Imaging: Fluoroscopic guidance         Anesthesia: Local anesthesia (1-2% Lidocaine) Anxiolysis: None                 Sedation: No Sedation                       DOS: 05/31/2023  Performed by: Oswaldo Done, MD  Purpose: Diagnostic/Therapeutic Indications: Lumbar radicular pain of intraspinal etiology of more than 4 weeks that has failed to respond to conservative therapy and is severe enough to impact quality of life or function. 1. Chronic lumbar radiculopathy   2. Lumbar spinal stenosis (L4-5) (Severe)   3. Chronic lower extremity radicular pain (S1 dermatome) (Bilateral) (L>R)   4. Chronic low back pain (1ry area of Pain) (Bilateral) (L>R) w/ sciatica (Bilateral)   5. Chronic lower extremity pain (2ry area of Pain) (Bilateral) (L>R)   6. DDD (degenerative disc disease), lumbar   7. Lumbar spondylosis with radiculopathy   8. Chronic anticoagulation (Plavix)    NAS-11 Pain score:   Pre-procedure: 6 /10   Post-procedure: 1 /10      Effectiveness:  Initial hour after procedure: 0 %. Subsequent 4-6 hours post-procedure: 0 %. Analgesia past initial 6 hours: 0 % (patient reports that when he sits the pain is worse.  said he had a lot of residual pain following procedure and found it hard to get comfortable). Ongoing improvement:  Analgesic: The patient indicates no short-term or long-term benefit. Function: No improvement ROM: No improvement  Pharmacotherapy Assessment  Analgesic: No chronic opioid analgesics therapy prescribed by our practice. Tramadol 50 mg 1 tablet by mouth every 8 hours (150 mg/day of tramadol) (15  MME/day) MME/day: 15 mg/day.   Monitoring: [place] PMP: PDMP reviewed during this encounter.       Pharmacotherapy: No side-effects or adverse reactions reported. Compliance: No problems identified. Effectiveness: Clinically acceptable.  Vernie Ammons, RN  06/21/2023  2:30 PM  Sign when Signing Visit Safety precautions to be maintained throughout the outpatient stay will include: orient to surroundings, keep bed in low position, maintain call bell within reach at all times, provide assistance with transfer out of bed and ambulation.     No results found for: "CBDTHCR" No results found for: "D8THCCBX" No results found for: "D9THCCBX"  UDS:  Summary  Date Value Ref Range Status  08/31/2017 FINAL  Final    Comment:    ==================================================================== [facility] COMP DRUG ANALYSIS,UR ==================================================================== Test                             Result       Flag       Units Drug Present and Declared for Prescription Verification   Acetaminophen                  PRESENT      EXPECTED Drug Present not Declared for Prescription Verification   Tramadol                       >  5102        UNEXPECTED ng/mg creat   O-Desmethyltramadol            5066         UNEXPECTED ng/mg creat   N-Desmethyltramadol            1382         UNEXPECTED ng/mg creat    Source of tramadol is a prescription medication.    O-desmethyltramadol and N-desmethyltramadol are expected    metabolites of tramadol. Drug Absent but Declared for Prescription Verification   Salicylate                     Not Detected UNEXPECTED    Aspirin, as indicated in the declared medication list, is not    always detected even when used as directed.   Metoprolol                     Not Detected UNEXPECTED ==================================================================== Test                      Result    Flag   Units      Ref Range   Creatinine               98               mg/dL      >=16 ==================================================================== Declared Medications:  The flagging and interpretation on this report are based on the  following declared medications.  Unexpected results may arise from  inaccuracies in the declared medications.  **Note: The testing scope of this panel includes these medications:  Metoprolol  **Note: The testing scope of this panel does not include small to  moderate amounts of these reported medications:  Acetaminophen  Aspirin  **Note: The testing scope of this panel does not include following  reported medications:  Atorvastatin  Clopidogrel  Hydrochlorothiazide (HCTZ)  Isosorbide Mononitrate  Lisinopril  Multivitamin  Nitroglycerin  Pantoprazole  Tamsulosin ==================================================================== For clinical consultation, please call [phone]. ====================================================================       ROS  Constitutional: Denies any fever or chills Gastrointestinal: No reported hemesis, hematochezia, vomiting, or acute GI distress Musculoskeletal: Denies any acute onset joint swelling, redness, loss of ROM, or weakness Neurological: No reported episodes of acute onset apraxia, aphasia, dysarthria, agnosia, amnesia, paralysis, loss of coordination, or loss of consciousness  Medication Review  Multiple Vitamins-Minerals, Polypodium Leucotomos, Turmeric, acetaminophen, amitriptyline, aspirin EC, atorvastatin, carvedilol, clopidogrel, cyanocobalamin, ezetimibe, isosorbide mononitrate, mupirocin ointment, niacinamide, pantoprazole, sacubitril-valsartan, spironolactone, tamsulosin, tetrahydrozoline, and tiZANidine  History Review  Allergy: Mr. Grandstaff is allergic to ranitidine and ranexa [ranolazine er]. Drug: Mr. Sporer  reports no history of drug use. Alcohol:  reports no history of alcohol use. Tobacco:  reports that he quit smoking about  33 years ago. His smoking use included cigarettes. He has a 50.00 pack-year smoking history. He has never used smokeless tobacco. Social: Mr. Kanady  reports that he quit smoking about 33 years ago. His smoking use included cigarettes. He has a 50.00 pack-year smoking history. He has never used smokeless tobacco. He reports that he does not drink alcohol and does not use drugs. Medical:  has a past medical history of Alcohol abuse, Anemia, AS (aortic stenosis), CAD (coronary artery disease), Carotid bruit, Chest pain, GERD (gastroesophageal reflux disease), History of kidney stones, HOH (hard of hearing), HTN (hypertension), Hyperlipidemia, Myocardial infarction (HCC) (1996), Nephrolithiasis, OSA (obstructive sleep apnea), PAD (peripheral  artery disease) (HCC) (02/2014), Shortness of breath, Squamous cell carcinoma of skin (12/17/2020), Squamous cell carcinoma of skin (12/17/2020), Squamous cell carcinoma of skin (12/17/2020), and Thrombocytopenia (HCC). Surgical: Mr. Coker  has a past surgical history that includes Stomach surgery; Coronary artery bypass graft (1991); Inguinal hernia repair (11/10/2011); left heart catheterization with coronary/graft angiogram (N/A, 03/06/2014); abdominal aortagram (N/A, 03/06/2014); Cardiac catheterization (02/2014); Cardiac catheterization (N/A, 07/31/2015); Cardiac catheterization (N/A, 07/31/2015); Cardiac catheterization (N/A, 05/26/2016); Cysto; Transcatheter aortic valve replacement, transfemoral (N/A, 06/01/2016); TEE without cardioversion (N/A, 06/01/2016); LEFT HEART CATH AND CORONARY ANGIOGRAPHY (Left, 05/15/2018); Esophagogastroduodenoscopy (egd) with propofol (N/A, 10/07/2018); Colonoscopy with propofol (N/A, 10/07/2018); Givens capsule study (N/A, 12/22/2018); enteroscopy (N/A, 12/25/2018); Colonoscopy with propofol (N/A, 01/08/2019); Laparoscopic right colectomy (Right, 02/09/2019); BIV PACEMAKER INSERTION CRT-P (N/A, 07/23/2019); and RIGHT HEART CATH AND CORONARY/GRAFT ANGIOGRAPHY  (N/A, 09/05/2020). Family: family history includes Heart disease in his father and mother.  Laboratory Chemistry Profile   Renal Lab Results  Component Value Date   BUN 17 03/25/2022   CREATININE 1.13 03/25/2022   BCR 9 (L) 01/07/2022   GFRAA >60 09/03/2020   GFRNONAA >60 03/25/2022    Hepatic Lab Results  Component Value Date   AST 25 01/07/2022   ALT 45 (H) 01/07/2022   ALBUMIN 4.5 01/07/2022   ALKPHOS 94 01/07/2022    Electrolytes Lab Results  Component Value Date   NA 136 03/25/2022   K 3.9 03/25/2022   CL 106 03/25/2022   CALCIUM 9.4 03/25/2022   MG 2.0 02/10/2019   PHOS 1.7 (L) 02/10/2019    Bone Lab Results  Component Value Date   25OHVITD1 31 08/31/2017   25OHVITD2 <1.0 08/31/2017   25OHVITD3 31 08/31/2017    Inflammation (CRP: Acute Phase) (ESR: Chronic Phase) Lab Results  Component Value Date   CRP 0.4 08/31/2017   ESRSEDRATE 3 08/31/2017         Note: Above Lab results reviewed.  Recent Imaging Review  DG PAIN CLINIC C-ARM 1-60 MIN NO REPORT Fluoro was used, but no Radiologist interpretation will be provided.  Please refer to "NOTES" tab for provider progress note. Note: Reviewed        Physical Exam  General appearance: Well nourished, well developed, and well hydrated. In no apparent acute distress Mental status: Alert, oriented x 3 (person, place, & time)       Respiratory: No evidence of acute respiratory distress Eyes: PERLA Vitals: BP 114/74 (BP Location: Left Arm, Patient Position: Sitting, Cuff Size: Normal)   Pulse 74   Temp 97.8 F (36.6 C) (Temporal)   Resp 16   Ht 5\' 9"  (1.753 m)   Wt 195 lb (88.5 kg)   SpO2 98%   BMI 28.80 kg/m  BMI: Estimated body mass index is 28.8 kg/m as calculated from the following:   Height as of this encounter: 5\' 9"  (1.753 m).   Weight as of this encounter: 195 lb (88.5 kg). Ideal: Ideal body weight: 70.7 kg (155 lb 13.8 oz) Adjusted ideal body weight: 77.8 kg (171 lb 8.3 oz)  Assessment    Diagnosis Status  1. Chronic lumbar radiculopathy   2. Chronic lower extremity radicular pain (S1 dermatome) (Bilateral) (L>R)   3. Chronic low back pain (1ry area of Pain) (Bilateral) (L>R) w/ sciatica (Bilateral)   4. Postop check   5. Chronic lower extremity pain (2ry area of Pain) (Bilateral) (L>R)   6. DDD (degenerative disc disease), lumbar   7. Abnormal MRI, lumbar spine (12/09/2017)   8. Chronic hip  pain (Right)   9. Lumbar spinal stenosis (L4-5) (Severe)    Controlled Controlled Controlled   Updated Problems: No problems updated.  Plan of Care  Problem-specific:  No problem-specific Assessment & Plan notes found for this encounter.  Mr. EBIN PALAZZI has a current medication list which includes the following long-term medication(s): amitriptyline, atorvastatin, carvedilol, ezetimibe, isosorbide mononitrate, pantoprazole, and spironolactone.  Pharmacotherapy (Medications Ordered): No orders of the defined types were placed in this encounter.  Orders:  Orders Placed This Encounter  Procedures   MR LUMBAR SPINE WO CONTRAST    Patient presents with axial pain with possible radicular component. Please assist [place] in identifying specific level(s) and laterality of any additional findings such as: 1. Facet (Zygapophyseal) joint DJD (Hypertrophy, space narrowing, subchondral sclerosis, and/or osteophyte formation) 2. DDD and/or IVDD (Loss of disc height, desiccation, gas patterns, osteophytes, endplate sclerosis, or "Black disc disease") 3. Pars defects 4. Spondylolisthesis, spondylosis, and/or spondyloarthropathies (include Degree/Grade of displacement in mm) (stability) 5. Vertebral body Fractures (acute/chronic) (state percentage of collapse) 6. Demineralization (osteopenia/osteoporotic) 7. Bone pathology 8. Foraminal narrowing  9. Surgical changes 10. Central, Lateral Recess, and/or Foraminal Stenosis (include AP diameter of stenosis in mm) 11. Surgical changes  (hardware type, status, and presence of fibrosis) 12. Modic Type Changes (MRI only) 13. IVDD (Disc bulge, protrusion, herniation, extrusion) (Level, laterality, extent)    Standing Status:   Future    Standing Expiration Date:   07/22/2023    Scheduling Instructions:     Please make sure that the patient understands that this needs to be done as soon as possible. Never have the patient do the imaging "just before the next appointment". Inform patient that having the imaging done within the [facility] will expedite the availability of the results and will provide      imaging availability to the requesting physician. In addition inform the patient that the imaging order has an expiration date and will not be renewed if not done within the active period.    Order Specific Question:   What is the patient's sedation requirement?    Answer:   No Sedation    Order Specific Question:   Does the patient have a pacemaker or implanted devices?    Answer:   No    Order Specific Question:   Preferred imaging location?    Answer:   [facility] (table limit-350lbs)    Order Specific Question:   Call Results- Best Contact Number?    Answer:   [phone] [facility])    Order Specific Question:   Radiology Contrast Protocol - do NOT remove file path    Answer:   [email or web address]   Ambulatory referral to Physical Therapy    Referral Priority:   Routine    Referral Type:   Physical Medicine    Referral Reason:   Specialty Services Required    Requested Specialty:   Physical Therapy    Number of Visits Requested:   1   Nursing Instructions:    Please complete this patient's postprocedure evaluation.    Scheduling Instructions:     Please complete this patient's postprocedure evaluation.   Follow-up plan:   Return for Eval-day (M,W), (F2F), for review of ordered tests after Lumbar MRI.      Interventional Therapies  Risk  Complexity Considerations:    PLAVIX ANTICOAGULATION: (Stop: 7-10 days  Restart: 2 hours)    Planned  Pending:   Therapeutic right L4-5 LESI #1  Therapeutic right (L3-4, L4-5,  L5-S1) lumbar facet MBB #5 + right SI block #4 + right IA hip inj. #2    Under consideration:   Therapeutic bilateral lumbar facet MBB #4 + bilateral SI joint Blk #4 (last done 07/24/2020)  Possible bilateral lumbar facet RFA #1 Possible bilateral SI joint RFA #1  Diagnostic left L2 TFESI  Diagnostic bilateral L3 TFESI  Diagnostic left hip injections    Completed:   Diagnostic/therapeutic right IA hip joint inj. x1 (06/10/2022) (100/100/75/100)  Diagnostic right (L3-4, L4-5, L5-S1) lumbar facet MBB x3 (07/24/2020) (100/100/75/75)  Diagnostic right (L4-5, L5-S1) lumbar facet MBB x1 (03/03/2023) (100/100/50/50)  Diagnostic left (L3-4, L4-5, L5-S1) lumbar facet MBB x3 (07/24/2020) (100/100/75/75)  Diagnostic right SI joint Blk x3 (07/24/2020) (100/100/75/75)  Diagnostic left SI joint Blk x3 (07/24/2020) (100/100/75/75)  Therapeutic/palliative left L4-5 LESI x2 (03/09/2018) (100/100/85/75-100)    Therapeutic  Palliative (PRN) options:   Therapeutic lumbar facet MBB   Therapeutic SI joint Blk   Therapeutic/palliative left L4-5 LESI #3         Recent Visits Date Type Provider Dept  05/31/23 Procedure visit Delano Metz, MD [facility]  05/19/23 Office Visit Delano Metz, MD [facility]  Showing recent visits within past 90 days and meeting all other requirements Today's Visits Date Type Provider Dept  06/21/23 Office Visit Delano Metz, MD [facility]  Showing today's visits and meeting all other requirements Future Appointments No visits were found meeting these conditions. Showing future appointments within next 90 days and meeting all other requirements  I discussed the assessment and treatment plan with the patient. The patient was provided an opportunity to ask questions and all were  answered. The patient agreed with the plan and demonstrated an understanding of the instructions.  Patient advised to call back or seek an in-person evaluation if the symptoms or condition worsens.  Duration of encounter: 30 minutes.  Total time on encounter, as per AMA guidelines included both the face-to-face and non-face-to-face time personally spent by the physician and/or other qualified health care professional(s) on the day of the encounter (includes time in activities that require the physician or other qualified health care professional and does not include time in activities normally performed by clinical staff). Physician's time may include the following activities when performed: Preparing to see the patient (e.g., pre-charting review of records, searching for previously ordered imaging, lab work, and nerve conduction tests) Review of prior analgesic pharmacotherapies. Reviewing PMP Interpreting ordered tests (e.g., lab work, imaging, nerve conduction tests) Performing post-procedure evaluations, including interpretation of diagnostic procedures Obtaining and/or reviewing separately obtained history Performing a medically appropriate examination and/or evaluation Counseling and educating the patient/family/caregiver Ordering medications, tests, or procedures Referring and communicating with other health care professionals (when not separately reported) Documenting clinical information in the electronic or other health record Independently interpreting results (not separately reported) and communicating results to the patient/ family/caregiver Care coordination (not separately reported)  Note by: Oswaldo Done, MD Date: 06/21/2023; Time: 2:56 PM

## 2023-06-23 ENCOUNTER — Telehealth: Payer: Self-pay

## 2023-06-23 ENCOUNTER — Other Ambulatory Visit: Payer: Self-pay | Admitting: Pain Medicine

## 2023-06-23 DIAGNOSIS — Z95810 Presence of automatic (implantable) cardiac defibrillator: Secondary | ICD-10-CM

## 2023-06-23 DIAGNOSIS — M5416 Radiculopathy, lumbar region: Secondary | ICD-10-CM

## 2023-06-23 DIAGNOSIS — G8929 Other chronic pain: Secondary | ICD-10-CM

## 2023-06-23 DIAGNOSIS — M5136 Other intervertebral disc degeneration, lumbar region: Secondary | ICD-10-CM

## 2023-06-23 DIAGNOSIS — R937 Abnormal findings on diagnostic imaging of other parts of musculoskeletal system: Secondary | ICD-10-CM

## 2023-06-23 DIAGNOSIS — M4726 Other spondylosis with radiculopathy, lumbar region: Secondary | ICD-10-CM

## 2023-06-23 DIAGNOSIS — M48062 Spinal stenosis, lumbar region with neurogenic claudication: Secondary | ICD-10-CM

## 2023-06-23 NOTE — Telephone Encounter (Signed)
Per MRI scheduling/specials we cannot do an MRI on Matthew [name], due to his defibrillator. It will not be safe.

## 2023-06-23 NOTE — Progress Notes (Signed)
Defibrillator in situ - NO MRI. Orders changed to CT.

## 2023-06-28 ENCOUNTER — Encounter: Payer: Self-pay | Admitting: Pain Medicine

## 2023-07-06 ENCOUNTER — Ambulatory Visit
Admission: RE | Admit: 2023-07-06 | Discharge: 2023-07-06 | Disposition: A | Discharge status: Home / Self Care | Payer: Medicare HMO | Source: Ambulatory Visit | Attending: Pain Medicine | Admitting: Pain Medicine

## 2023-07-06 DIAGNOSIS — G8929 Other chronic pain: Secondary | ICD-10-CM | POA: Insufficient documentation

## 2023-07-06 DIAGNOSIS — M5442 Lumbago with sciatica, left side: Secondary | ICD-10-CM | POA: Diagnosis not present

## 2023-07-06 DIAGNOSIS — R937 Abnormal findings on diagnostic imaging of other parts of musculoskeletal system: Secondary | ICD-10-CM | POA: Insufficient documentation

## 2023-07-06 DIAGNOSIS — M4726 Other spondylosis with radiculopathy, lumbar region: Secondary | ICD-10-CM | POA: Insufficient documentation

## 2023-07-06 DIAGNOSIS — M5416 Radiculopathy, lumbar region: Secondary | ICD-10-CM | POA: Insufficient documentation

## 2023-07-06 DIAGNOSIS — M5410 Radiculopathy, site unspecified: Secondary | ICD-10-CM | POA: Diagnosis not present

## 2023-07-06 DIAGNOSIS — I700 Atherosclerosis of aorta: Secondary | ICD-10-CM | POA: Diagnosis not present

## 2023-07-06 DIAGNOSIS — M48062 Spinal stenosis, lumbar region with neurogenic claudication: Secondary | ICD-10-CM | POA: Insufficient documentation

## 2023-07-06 DIAGNOSIS — M79604 Pain in right leg: Secondary | ICD-10-CM | POA: Diagnosis not present

## 2023-07-06 DIAGNOSIS — S32019A Unspecified fracture of first lumbar vertebra, initial encounter for closed fracture: Secondary | ICD-10-CM | POA: Diagnosis not present

## 2023-07-06 DIAGNOSIS — M79605 Pain in left leg: Secondary | ICD-10-CM | POA: Diagnosis present

## 2023-07-06 DIAGNOSIS — Z95810 Presence of automatic (implantable) cardiac defibrillator: Secondary | ICD-10-CM | POA: Insufficient documentation

## 2023-07-06 DIAGNOSIS — M5136 Other intervertebral disc degeneration, lumbar region: Secondary | ICD-10-CM | POA: Diagnosis not present

## 2023-07-06 DIAGNOSIS — M5441 Lumbago with sciatica, right side: Secondary | ICD-10-CM | POA: Insufficient documentation

## 2023-07-06 DIAGNOSIS — M48061 Spinal stenosis, lumbar region without neurogenic claudication: Secondary | ICD-10-CM | POA: Diagnosis not present

## 2023-07-17 ENCOUNTER — Encounter: Payer: Self-pay | Admitting: Cardiovascular Disease

## 2023-07-18 ENCOUNTER — Other Ambulatory Visit: Payer: Self-pay

## 2023-07-18 MED ORDER — ENTRESTO 24-26 MG PO TABS: 1.0000 | ORAL_TABLET | Freq: Two times a day (BID) | ORAL | 1 refills | Status: DC

## 2023-07-18 MED ORDER — ENTRESTO 24-26 MG PO TABS: 1.0000 | ORAL_TABLET | Freq: Two times a day (BID) | ORAL | 0 refills | Status: DC

## 2023-07-19 DIAGNOSIS — S32009A Unspecified fracture of unspecified lumbar vertebra, initial encounter for closed fracture: Secondary | ICD-10-CM | POA: Insufficient documentation

## 2023-07-19 NOTE — Progress Notes (Unsigned)
PROVIDER NOTE: Information contained herein reflects review and annotations entered in association with encounter. Interpretation of such information and data should be left to medically-trained personnel. Information provided to patient can be located elsewhere in the medical record under "Patient Instructions". Document created using STT-dictation technology, any transcriptional errors that may result from process are unintentional.    Patient: Matthew [name]  Service Category: E/M  Provider: Oswaldo Done, MD  DOB: 1936-09-03  DOS: 07/20/2023  Referring Provider: Amie Portland, MD  MRN: [id number]  Specialty: Interventional Pain Management  PCP: Amie Portland, MD  Type: Established Patient  Setting: Ambulatory outpatient    Location: Office  Delivery: Face-to-face     HPI  Matthew [name], a 87 y.o. year old male, is here today because of his Chronic bilateral low back pain with bilateral sciatica [M54.42, M54.41, G89.29]. Mr. Maturo primary complain today is No chief complaint on file.  Pertinent problems: Mr. Sherrin has Chronic low back pain (1ry area of Pain) (Bilateral) (L>R) w/ sciatica (Bilateral); Chronic lower extremity pain (2ry area of Pain) (Bilateral) (L>R); Chronic pain syndrome; Chronic sacroiliac joint pain (Bilateral) (L>R); Chronic hip pain (Right); Neurogenic pain; Lumbar facet syndrome (Bilateral) (L>R); DDD (degenerative disc disease), lumbar; Lumbar facet osteoarthritis; Lumbar spondylosis with radiculopathy; Chronic lower extremity radicular pain (S1 dermatome) (Bilateral) (L>R); Lumbar spinal stenosis (L4-5) (Severe); Abnormal MRI, lumbar spine (12/09/2017); Chronic lumbar radiculopathy; Spondylosis without myelopathy or radiculopathy, lumbosacral region; Other spondylosis, sacral and sacrococcygeal region; Chronic low back pain (Bilateral) w/o sciatica; Osteoarthritis of hip (Right); and Lumbar transverse process fracture, closed, initial encounter (HCC) (Right: L1, L2, L3)  on their pertinent problem list. Pain Assessment: Severity of   is reported as a  /10. Location:    / . Onset:  . Quality:  . Timing:  . Modifying factor(s):  Marland Kitchen Vitals:  vitals were not taken for this visit.  BMI: Estimated body mass index is 28.8 kg/m as calculated from the following:   Height as of 06/21/23: 5\' 9"  (1.753 m).   Weight as of 06/21/23: 195 lb (88.5 kg). Last encounter: 06/21/2023. Last procedure: 05/31/2023.  Reason for encounter: new problems. ***  Lumbar CT IMPRESSION: 1. Acute fractures of the right L1, L2, and L3 transverse processes. 2. Multilevel degenerative changes of the lumbar spine, as described above. There is moderate spinal canal narrowing at L4-5. There is severe neural foraminal narrowing at L3-4 (right) and L4-5 (bilateral).    Pharmacotherapy Assessment  Analgesic: No chronic opioid analgesics therapy prescribed by our practice. Tramadol 50 mg 1 tablet by mouth every 8 hours (150 mg/day of tramadol) (15 MME/day) MME/day: 15 mg/day.   Monitoring: [place] PMP: PDMP reviewed during this encounter.       Pharmacotherapy: No side-effects or adverse reactions reported. Compliance: No problems identified. Effectiveness: Clinically acceptable.  No notes on file  No results found for: "CBDTHCR" No results found for: "D8THCCBX" No results found for: "D9THCCBX"  UDS:  Summary  Date Value Ref Range Status  08/31/2017 FINAL  Final    Comment:    ==================================================================== [facility] COMP DRUG ANALYSIS,UR ==================================================================== Test                             Result       Flag       Units Drug Present and Declared for Prescription Verification   Acetaminophen  PRESENT      EXPECTED Drug Present not Declared for Prescription Verification   Tramadol                       >5102        UNEXPECTED ng/mg creat   O-Desmethyltramadol            5066         UNEXPECTED  ng/mg creat   N-Desmethyltramadol            1382         UNEXPECTED ng/mg creat    Source of tramadol is a prescription medication.    O-desmethyltramadol and N-desmethyltramadol are expected    metabolites of tramadol. Drug Absent but Declared for Prescription Verification   Salicylate                     Not Detected UNEXPECTED    Aspirin, as indicated in the declared medication list, is not    always detected even when used as directed.   Metoprolol                     Not Detected UNEXPECTED ==================================================================== Test                      Result    Flag   Units      Ref Range   Creatinine              98               mg/dL      >=81 ==================================================================== Declared Medications:  The flagging and interpretation on this report are based on the  following declared medications.  Unexpected results may arise from  inaccuracies in the declared medications.  **Note: The testing scope of this panel includes these medications:  Metoprolol  **Note: The testing scope of this panel does not include small to  moderate amounts of these reported medications:  Acetaminophen  Aspirin  **Note: The testing scope of this panel does not include following  reported medications:  Atorvastatin  Clopidogrel  Hydrochlorothiazide (HCTZ)  Isosorbide Mononitrate  Lisinopril  Multivitamin  Nitroglycerin  Pantoprazole  Tamsulosin ==================================================================== For clinical consultation, please call [phone]. ====================================================================       ROS  Constitutional: Denies any fever or chills Gastrointestinal: No reported hemesis, hematochezia, vomiting, or acute GI distress Musculoskeletal: Denies any acute onset joint swelling, redness, loss of ROM, or weakness Neurological: No reported episodes of acute onset apraxia,  aphasia, dysarthria, agnosia, amnesia, paralysis, loss of coordination, or loss of consciousness  Medication Review  Multiple Vitamins-Minerals, Polypodium Leucotomos, Turmeric, acetaminophen, amitriptyline, aspirin EC, atorvastatin, carvedilol, clopidogrel, cyanocobalamin, ezetimibe, isosorbide mononitrate, mupirocin ointment, niacinamide, pantoprazole, sacubitril-valsartan, spironolactone, tamsulosin, tetrahydrozoline, and tiZANidine  History Review  Allergy: Mr. Beachum is allergic to ranitidine and ranexa [ranolazine er]. Drug: Mr. Malina  reports no history of drug use. Alcohol:  reports no history of alcohol use. Tobacco:  reports that he quit smoking about 33 years ago. His smoking use included cigarettes. He started smoking about 83 years ago. He has a 50 pack-year smoking history. He has never used smokeless tobacco. Social: Mr. Detloff  reports that he quit smoking about 33 years ago. His smoking use included cigarettes. He started smoking about 83 years ago. He has a 50 pack-year smoking history. He has never used smokeless tobacco. He reports that he does not drink alcohol  and does not use drugs. Medical:  has a past medical history of Alcohol abuse, Anemia, AS (aortic stenosis), CAD (coronary artery disease), Carotid bruit, Chest pain, GERD (gastroesophageal reflux disease), History of kidney stones, HOH (hard of hearing), HTN (hypertension), Hyperlipidemia, Myocardial infarction (HCC) (1996), Nephrolithiasis, OSA (obstructive sleep apnea), PAD (peripheral artery disease) (HCC) (02/2014), Shortness of breath, Squamous cell carcinoma of skin (12/17/2020), Squamous cell carcinoma of skin (12/17/2020), Squamous cell carcinoma of skin (12/17/2020), and Thrombocytopenia (HCC). Surgical: Mr. Schoeff  has a past surgical history that includes Stomach surgery; Coronary artery bypass graft (1991); Inguinal hernia repair (11/10/2011); left heart catheterization with coronary/graft angiogram (N/A, 03/06/2014);  abdominal aortagram (N/A, 03/06/2014); Cardiac catheterization (02/2014); Cardiac catheterization (N/A, 07/31/2015); Cardiac catheterization (N/A, 07/31/2015); Cardiac catheterization (N/A, 05/26/2016); Cysto; Transcatheter aortic valve replacement, transfemoral (N/A, 06/01/2016); TEE without cardioversion (N/A, 06/01/2016); LEFT HEART CATH AND CORONARY ANGIOGRAPHY (Left, 05/15/2018); Esophagogastroduodenoscopy (egd) with propofol (N/A, 10/07/2018); Colonoscopy with propofol (N/A, 10/07/2018); Givens capsule study (N/A, 12/22/2018); enteroscopy (N/A, 12/25/2018); Colonoscopy with propofol (N/A, 01/08/2019); Laparoscopic right colectomy (Right, 02/09/2019); BIV PACEMAKER INSERTION CRT-P (N/A, 07/23/2019); and RIGHT HEART CATH AND CORONARY/GRAFT ANGIOGRAPHY (N/A, 09/05/2020). Family: family history includes Heart disease in his father and mother.  Laboratory Chemistry Profile   Renal Lab Results  Component Value Date   BUN 17 03/25/2022   CREATININE 1.13 03/25/2022   BCR 9 (L) 01/07/2022   GFRAA >60 09/03/2020   GFRNONAA >60 03/25/2022    Hepatic Lab Results  Component Value Date   AST 25 01/07/2022   ALT 45 (H) 01/07/2022   ALBUMIN 4.5 01/07/2022   ALKPHOS 94 01/07/2022    Electrolytes Lab Results  Component Value Date   NA 136 03/25/2022   K 3.9 03/25/2022   CL 106 03/25/2022   CALCIUM 9.4 03/25/2022   MG 2.0 02/10/2019   PHOS 1.7 (L) 02/10/2019    Bone Lab Results  Component Value Date   25OHVITD1 31 08/31/2017   25OHVITD2 <1.0 08/31/2017   25OHVITD3 31 08/31/2017    Inflammation (CRP: Acute Phase) (ESR: Chronic Phase) Lab Results  Component Value Date   CRP 0.4 08/31/2017   ESRSEDRATE 3 08/31/2017         Note: Above Lab results reviewed.  Recent Imaging Review  CT LUMBAR SPINE WO CONTRAST CLINICAL DATA:  Low back pain, symptoms persist with > 6 wks treatment Lumbar radiculopathy, symptoms persist with > 6 wks treatment  EXAM: CT LUMBAR SPINE WITHOUT  CONTRAST  TECHNIQUE: Multidetector CT imaging of the lumbar spine was performed without intravenous contrast administration. Multiplanar CT image reconstructions were also generated.  RADIATION DOSE REDUCTION: This exam was performed according to the departmental dose-optimization program which includes automated exposure control, adjustment of the mA and/or kV according to patient size and/or use of iterative reconstruction technique.  COMPARISON:  None Available.  FINDINGS: Segmentation: 5 lumbar type vertebrae.  Alignment: Trace retrolisthesis of L5 on S1.  Vertebrae: There is an acute fracture through the right L1 transverse process (series 5, image 35), right L2 transverse process (series 5, image 52), right L3 transverse process (series 5, image 71). Degenerative endplate changes of the inferior and superior endplates of L4 on L5.  Paraspinal and other soft tissues: Bilateral nephrolithiasis. No hydronephrosis cirrhosis. Aortic and pelvic vasculature atherosclerotic calcification. No retroperitoneal lymphadenopathy. Surgical clips in the right upper quadrant  Disc levels:  T12-L1: No evidence of high-grade spinal canal or neural foraminal stenosis.  L1-L2: No evidence of high-grade spinal canal or neural foraminal stenosis.  L2-L3: Mild bilateral facet degenerative change. No evidence of high-grade spinal canal stenosis. Moderate left-sided neural foraminal stenosis.  L3-L4: Mild bilateral facet degenerative change. Ligamentum flavum hypertrophy. Minimal disc bulge. Mild spinal canal narrowing. Severe right and moderate to severe left neural foraminal narrowing.  L4-L5: Moderate bilateral facet degenerative change with ligamentum flavum hypertrophy. Circumferential disc bulge. Moderate spinal canal narrowing. Severe bilateral neural foraminal narrowing.  L5-S1: Mild bilateral facet degenerative change. No significant spinal canal narrowing. Moderate bilateral  neural foraminal narrowing.  IMPRESSION: 1. Acute fractures of the right L1, L2, and L3 transverse processes. 2. Multilevel degenerative changes of the lumbar spine, as described above. There is moderate spinal canal narrowing at L4-L5. There is severe neural foraminal narrowing at L3-L4 (right) and L4-L5 (bilateral).  Aortic Atherosclerosis (ICD10-I70.0).  Electronically Signed   By: Lorenza Cambridge M.D.   On: 07/16/2023 18:14 Note: Reviewed        Physical Exam  General appearance: Well nourished, well developed, and well hydrated. In no apparent acute distress Mental status: Alert, oriented x 3 (person, place, & time)       Respiratory: No evidence of acute respiratory distress Eyes: PERLA Vitals: There were no vitals taken for this visit. BMI: Estimated body mass index is 28.8 kg/m as calculated from the following:   Height as of 06/21/23: 5\' 9"  (1.753 m).   Weight as of 06/21/23: 195 lb (88.5 kg). Ideal: Patient weight not recorded  Assessment   Diagnosis Status  1. Chronic low back pain (1ry area of Pain) (Bilateral) (L>R) w/ sciatica (Bilateral)   2. DDD (degenerative disc disease), lumbar   3. Lumbar transverse process fracture, closed, initial encounter (HCC) (Right: L1, L2, L3)   4. Chronic anticoagulation (Plavix)    Controlled Controlled Controlled   Updated Problems: Problem  Lumbar transverse process fracture, closed, initial encounter (HCC) (Right: L1, L2, L3)   Lumbar CT IMPRESSION: 1. Acute fractures of the right L1, L2, and L3 transverse processes.     Plan of Care  Problem-specific:  No problem-specific Assessment & Plan notes found for this encounter.  Mr. TRAYSHAUN CLERE has a current medication list which includes the following long-term medication(s): amitriptyline, atorvastatin, carvedilol, ezetimibe, isosorbide mononitrate, pantoprazole, and spironolactone.  Pharmacotherapy (Medications Ordered): No orders of the defined types were placed in  this encounter.  Orders:  No orders of the defined types were placed in this encounter.  Follow-up plan:   No follow-ups on file.      Interventional Therapies  Risk  Complexity Considerations:   PLAVIX ANTICOAGULATION: (Stop: 7-10 days  Restart: 2 hours)    Planned  Pending:   Therapeutic right L4-5 LESI #1  Therapeutic right (L3-4, L4-5, L5-S1) lumbar facet MBB #5 + right SI block #4 + right IA hip inj. #2    Under consideration:   Therapeutic bilateral lumbar facet MBB #4 + bilateral SI joint Blk #4 (last done 07/24/2020)  Possible bilateral lumbar facet RFA #1 Possible bilateral SI joint RFA #1  Diagnostic left L2 TFESI  Diagnostic bilateral L3 TFESI  Diagnostic left hip injections    Completed:   Diagnostic/therapeutic right IA hip joint inj. x1 (06/10/2022) (100/100/75/100)  Diagnostic right (L3-4, L4-5, L5-S1) lumbar facet MBB x3 (07/24/2020) (100/100/75/75)  Diagnostic right (L4-5, L5-S1) lumbar facet MBB x1 (03/03/2023) (100/100/50/50)  Diagnostic left (L3-4, L4-5, L5-S1) lumbar facet MBB x3 (07/24/2020) (100/100/75/75)  Diagnostic right SI joint Blk x3 (07/24/2020) (100/100/75/75)  Diagnostic left SI joint Blk x3 (07/24/2020) (100/100/75/75)  Therapeutic/palliative  left L4-5 LESI x2 (03/09/2018) (100/100/85/75-100)    Therapeutic  Palliative (PRN) options:   Therapeutic lumbar facet MBB   Therapeutic SI joint Blk   Therapeutic/palliative left L4-5 LESI #3          Recent Visits Date Type Provider Dept  06/21/23 Office Visit Delano Metz, MD [facility]  05/31/23 Procedure visit Delano Metz, MD [facility]  05/19/23 Office Visit Delano Metz, MD [facility]  Showing recent visits within past 90 days and meeting all other requirements Future Appointments Date Type Provider Dept  07/20/23 Appointment Delano Metz, MD [facility]  Showing future appointments within next 90 days and meeting all  other requirements  I discussed the assessment and treatment plan with the patient. The patient was provided an opportunity to ask questions and all were answered. The patient agreed with the plan and demonstrated an understanding of the instructions.  Patient advised to call back or seek an in-person evaluation if the symptoms or condition worsens.  Duration of encounter: *** minutes.  Total time on encounter, as per AMA guidelines included both the face-to-face and non-face-to-face time personally spent by the physician and/or other qualified health care professional(s) on the day of the encounter (includes time in activities that require the physician or other qualified health care professional and does not include time in activities normally performed by clinical staff). Physician's time may include the following activities when performed: Preparing to see the patient (e.g., pre-charting review of records, searching for previously ordered imaging, lab work, and nerve conduction tests) Review of prior analgesic pharmacotherapies. Reviewing PMP Interpreting ordered tests (e.g., lab work, imaging, nerve conduction tests) Performing post-procedure evaluations, including interpretation of diagnostic procedures Obtaining and/or reviewing separately obtained history Performing a medically appropriate examination and/or evaluation Counseling and educating the patient/family/caregiver Ordering medications, tests, or procedures Referring and communicating with other health care professionals (when not separately reported) Documenting clinical information in the electronic or other health record Independently interpreting results (not separately reported) and communicating results to the patient/ family/caregiver Care coordination (not separately reported)  Note by: Oswaldo Done, MD Date: 07/20/2023; Time: 9:33 PM

## 2023-07-20 ENCOUNTER — Ambulatory Visit
Admission: RE | Admit: 2023-07-20 | Discharge: 2023-07-20 | Disposition: A | Discharge status: Home / Self Care | Payer: Medicare HMO | Source: Ambulatory Visit | Attending: Pain Medicine | Admitting: Pain Medicine

## 2023-07-20 ENCOUNTER — Ambulatory Visit (HOSPITAL_BASED_OUTPATIENT_CLINIC_OR_DEPARTMENT_OTHER): Payer: Medicare HMO | Admitting: Pain Medicine

## 2023-07-20 ENCOUNTER — Encounter: Payer: Self-pay | Admitting: Pain Medicine

## 2023-07-20 VITALS — BP 118/61 | HR 76 | Temp 97.2°F | Ht 69.0 in | Wt 195.0 lb

## 2023-07-20 DIAGNOSIS — M25561 Pain in right knee: Secondary | ICD-10-CM | POA: Insufficient documentation

## 2023-07-20 DIAGNOSIS — G8929 Other chronic pain: Secondary | ICD-10-CM

## 2023-07-20 DIAGNOSIS — M5442 Lumbago with sciatica, left side: Secondary | ICD-10-CM

## 2023-07-20 DIAGNOSIS — M5441 Lumbago with sciatica, right side: Secondary | ICD-10-CM

## 2023-07-20 DIAGNOSIS — S32009A Unspecified fracture of unspecified lumbar vertebra, initial encounter for closed fracture: Secondary | ICD-10-CM

## 2023-07-20 DIAGNOSIS — M25562 Pain in left knee: Secondary | ICD-10-CM | POA: Insufficient documentation

## 2023-07-20 DIAGNOSIS — Z7901 Long term (current) use of anticoagulants: Secondary | ICD-10-CM

## 2023-07-20 DIAGNOSIS — M5136 Other intervertebral disc degeneration, lumbar region: Secondary | ICD-10-CM | POA: Insufficient documentation

## 2023-07-20 DIAGNOSIS — M1712 Unilateral primary osteoarthritis, left knee: Secondary | ICD-10-CM | POA: Diagnosis not present

## 2023-07-20 DIAGNOSIS — M5410 Radiculopathy, site unspecified: Secondary | ICD-10-CM | POA: Diagnosis not present

## 2023-07-20 DIAGNOSIS — M1711 Unilateral primary osteoarthritis, right knee: Secondary | ICD-10-CM | POA: Diagnosis not present

## 2023-07-20 NOTE — Progress Notes (Signed)
Safety precautions to be maintained throughout the outpatient stay will include: orient to surroundings, keep bed in low position, maintain call bell within reach at all times, provide assistance with transfer out of bed and ambulation.  

## 2023-07-21 ENCOUNTER — Ambulatory Visit: Payer: Medicare HMO | Admitting: Dermatology

## 2023-07-25 DIAGNOSIS — M5410 Radiculopathy, site unspecified: Secondary | ICD-10-CM | POA: Diagnosis not present

## 2023-07-25 DIAGNOSIS — M5442 Lumbago with sciatica, left side: Secondary | ICD-10-CM | POA: Diagnosis not present

## 2023-07-26 ENCOUNTER — Ambulatory Visit (INDEPENDENT_AMBULATORY_CARE_PROVIDER_SITE_OTHER): Payer: Medicare HMO

## 2023-07-26 DIAGNOSIS — I255 Ischemic cardiomyopathy: Secondary | ICD-10-CM

## 2023-07-26 DIAGNOSIS — I5020 Unspecified systolic (congestive) heart failure: Secondary | ICD-10-CM

## 2023-07-26 LAB — CUP PACEART REMOTE DEVICE CHECK
Battery Remaining Longevity: 29 mo
Battery Remaining Percentage: 39 %
Battery Voltage: 2.96 V
Brady Statistic AP VP Percent: 80 %
Brady Statistic AP VS Percent: 1 %
Brady Statistic AS VP Percent: 15 %
Brady Statistic AS VS Percent: 1 %
Brady Statistic RA Percent Paced: 77 %
Date Time Interrogation Session: 20240813042227
Implantable Lead Connection Status: 753985
Implantable Lead Connection Status: 753985
Implantable Lead Connection Status: 753985
Implantable Lead Implant Date: 20200810
Implantable Lead Implant Date: 20200810
Implantable Lead Implant Date: 20200810
Implantable Lead Location: 753858
Implantable Lead Location: 753859
Implantable Lead Location: 753860
Implantable Lead Model: 5076
Implantable Lead Model: 5076
Implantable Pulse Generator Implant Date: 20200810
Lead Channel Impedance Value: 390 Ohm
Lead Channel Impedance Value: 400 Ohm
Lead Channel Impedance Value: 560 Ohm
Lead Channel Pacing Threshold Amplitude: 0.75 V
Lead Channel Pacing Threshold Amplitude: 1 V
Lead Channel Pacing Threshold Amplitude: 1 V
Lead Channel Pacing Threshold Pulse Width: 0.4 ms
Lead Channel Pacing Threshold Pulse Width: 0.4 ms
Lead Channel Pacing Threshold Pulse Width: 1 ms
Lead Channel Sensing Intrinsic Amplitude: 1.8 mV
Lead Channel Sensing Intrinsic Amplitude: 12 mV
Lead Channel Setting Pacing Amplitude: 2 V
Lead Channel Setting Pacing Amplitude: 2 V
Lead Channel Setting Pacing Amplitude: 2 V
Lead Channel Setting Pacing Pulse Width: 0.4 ms
Lead Channel Setting Pacing Pulse Width: 1 ms
Lead Channel Setting Sensing Sensitivity: 2 mV
Pulse Gen Model: 3562
Pulse Gen Serial Number: 9130392

## 2023-07-27 ENCOUNTER — Encounter: Payer: Self-pay | Admitting: Dermatology

## 2023-07-27 ENCOUNTER — Ambulatory Visit: Payer: Medicare HMO | Admitting: Dermatology

## 2023-07-27 DIAGNOSIS — M5410 Radiculopathy, site unspecified: Secondary | ICD-10-CM | POA: Diagnosis not present

## 2023-07-27 DIAGNOSIS — L570 Actinic keratosis: Secondary | ICD-10-CM

## 2023-07-27 DIAGNOSIS — D692 Other nonthrombocytopenic purpura: Secondary | ICD-10-CM

## 2023-07-27 DIAGNOSIS — L821 Other seborrheic keratosis: Secondary | ICD-10-CM

## 2023-07-27 DIAGNOSIS — M5442 Lumbago with sciatica, left side: Secondary | ICD-10-CM | POA: Diagnosis not present

## 2023-07-27 DIAGNOSIS — Z85828 Personal history of other malignant neoplasm of skin: Secondary | ICD-10-CM

## 2023-07-27 DIAGNOSIS — L738 Other specified follicular disorders: Secondary | ICD-10-CM

## 2023-07-27 DIAGNOSIS — W908XXA Exposure to other nonionizing radiation, initial encounter: Secondary | ICD-10-CM

## 2023-07-27 DIAGNOSIS — L578 Other skin changes due to chronic exposure to nonionizing radiation: Secondary | ICD-10-CM

## 2023-07-27 DIAGNOSIS — L820 Inflamed seborrheic keratosis: Secondary | ICD-10-CM | POA: Diagnosis not present

## 2023-07-27 NOTE — Progress Notes (Addendum)
Follow-Up Visit   Subjective  Matthew [name] is a 87 y.o. male who presents for the following: 6 month AK follow up. Patient does have spots at chest and back of neck that itch. Patient with hx of SCC.   The patient has spots, moles and lesions to be evaluated, some may be new or changing and the patient may have concern these could be cancer.   The following portions of the chart were reviewed this encounter and updated as appropriate: medications, allergies, medical history  Review of Systems:  No other skin or systemic complaints except as noted in HPI or Assessment and Plan.  Objective  Well appearing patient in no apparent distress; mood and affect are within normal limits.   A focused examination was performed of the following areas: All skin waist up examined  Relevant exam findings are noted in the Assessment and Plan.  L upper chest x 2 Erythematous stuck-on, waxy papule or plaque  L posterior neck x 1, R post arm x 1, L forearm x 1 (3) Erythematous thin papules/macules with gritty scale.     Assessment & Plan     Inflamed seborrheic keratosis L upper chest x 2  Symptomatic, irritating, patient would like treated.  Benign-appearing.  Call clinic for new or changing lesions.    Destruction of lesion - L upper chest x 2  Destruction method: cryotherapy   Informed consent: discussed and consent obtained   Lesion destroyed using liquid nitrogen: Yes   Cryotherapy cycles:  2 Outcome: patient tolerated procedure well with no complications   Post-procedure details: wound care instructions given    AK (actinic keratosis) (3) L posterior neck x 1, R post arm x 1, L forearm x 1  Actinic keratoses are precancerous spots that appear secondary to cumulative UV radiation exposure/sun exposure over time. They are chronic with expected duration over 1 year. A portion of actinic keratoses will progress to squamous cell carcinoma of the skin. It is not possible to  reliably predict which spots will progress to skin cancer and so treatment is recommended to prevent development of skin cancer.  Recommend daily broad spectrum sunscreen SPF 30+ to sun-exposed areas, reapply every 2 hours as needed.  Recommend staying in the shade or wearing long sleeves, sun glasses (UVA+UVB protection) and wide brim hats (4-inch brim around the entire circumference of the hat). Call for new or changing lesions.   Destruction of lesion - L posterior neck x 1, R post arm x 1, L forearm x 1 (3)  Destruction method: cryotherapy   Informed consent: discussed and consent obtained   Lesion destroyed using liquid nitrogen: Yes   Cryotherapy cycles:  2 Outcome: patient tolerated procedure well with no complications   Post-procedure details: wound care instructions given    Seborrheic keratoses  Solar purpura (HCC)  ACTINIC DAMAGE - chronic, secondary to cumulative UV radiation exposure/sun exposure over time - diffuse scaly erythematous macules with underlying dyspigmentation - Recommend daily broad spectrum sunscreen SPF 30+ to sun-exposed areas, reapply every 2 hours as needed.  - Recommend staying in the shade or wearing long sleeves, sun glasses (UVA+UVB protection) and wide brim hats (4-inch brim around the entire circumference of the hat). - Call for new or changing lesions.  Sebaceous Hyperplasia - Small yellow papules with a central dell - Benign-appearing - Observe. Call for changes.   Return in about 6 months (around 01/27/2024) for Hx SCC, TBSE.  Anise Salvo, RMA, am acting as scribe  for Elie Goody, MD .   Documentation: I have reviewed the above documentation for accuracy and completeness, and I agree with the above.  Elie Goody, MD

## 2023-07-27 NOTE — Patient Instructions (Addendum)

## 2023-08-01 NOTE — Addendum Note (Signed)
Addended by: Addison Bailey I on: 08/01/2023 07:50 AM   Modules accepted: Orders

## 2023-08-02 DIAGNOSIS — M5442 Lumbago with sciatica, left side: Secondary | ICD-10-CM | POA: Diagnosis not present

## 2023-08-02 DIAGNOSIS — M5410 Radiculopathy, site unspecified: Secondary | ICD-10-CM | POA: Diagnosis not present

## 2023-08-04 DIAGNOSIS — M5410 Radiculopathy, site unspecified: Secondary | ICD-10-CM | POA: Diagnosis not present

## 2023-08-04 DIAGNOSIS — M5442 Lumbago with sciatica, left side: Secondary | ICD-10-CM | POA: Diagnosis not present

## 2023-08-09 DIAGNOSIS — M5410 Radiculopathy, site unspecified: Secondary | ICD-10-CM | POA: Diagnosis not present

## 2023-08-09 DIAGNOSIS — M5442 Lumbago with sciatica, left side: Secondary | ICD-10-CM | POA: Diagnosis not present

## 2023-08-10 NOTE — Progress Notes (Signed)
Remote pacemaker transmission.   

## 2023-08-11 DIAGNOSIS — M5442 Lumbago with sciatica, left side: Secondary | ICD-10-CM | POA: Diagnosis not present

## 2023-08-11 DIAGNOSIS — M5410 Radiculopathy, site unspecified: Secondary | ICD-10-CM | POA: Diagnosis not present

## 2023-08-16 DIAGNOSIS — M5442 Lumbago with sciatica, left side: Secondary | ICD-10-CM | POA: Diagnosis not present

## 2023-08-16 DIAGNOSIS — M5410 Radiculopathy, site unspecified: Secondary | ICD-10-CM | POA: Diagnosis not present

## 2023-08-18 DIAGNOSIS — M5410 Radiculopathy, site unspecified: Secondary | ICD-10-CM | POA: Diagnosis not present

## 2023-08-18 DIAGNOSIS — M5442 Lumbago with sciatica, left side: Secondary | ICD-10-CM | POA: Diagnosis not present

## 2023-08-23 DIAGNOSIS — M5410 Radiculopathy, site unspecified: Secondary | ICD-10-CM | POA: Diagnosis not present

## 2023-08-23 DIAGNOSIS — M5442 Lumbago with sciatica, left side: Secondary | ICD-10-CM | POA: Diagnosis not present

## 2023-08-25 DIAGNOSIS — M5442 Lumbago with sciatica, left side: Secondary | ICD-10-CM | POA: Diagnosis not present

## 2023-08-25 DIAGNOSIS — M5410 Radiculopathy, site unspecified: Secondary | ICD-10-CM | POA: Diagnosis not present

## 2023-08-29 DIAGNOSIS — H353113 Nonexudative age-related macular degeneration, right eye, advanced atrophic without subfoveal involvement: Secondary | ICD-10-CM | POA: Diagnosis not present

## 2023-08-29 DIAGNOSIS — H353221 Exudative age-related macular degeneration, left eye, with active choroidal neovascularization: Secondary | ICD-10-CM | POA: Diagnosis not present

## 2023-08-29 DIAGNOSIS — Z961 Presence of intraocular lens: Secondary | ICD-10-CM | POA: Diagnosis not present

## 2023-08-30 DIAGNOSIS — M5442 Lumbago with sciatica, left side: Secondary | ICD-10-CM | POA: Diagnosis not present

## 2023-08-30 DIAGNOSIS — M5410 Radiculopathy, site unspecified: Secondary | ICD-10-CM | POA: Diagnosis not present

## 2023-09-02 ENCOUNTER — Other Ambulatory Visit: Payer: Self-pay | Admitting: Cardiovascular Disease

## 2023-09-02 NOTE — Telephone Encounter (Signed)
Good Morning,  Could you please schedule this patient a 6 month follow up visit? The patient was last seen by Dr. Kirke Corin on 01-28-23. Thank you so much.

## 2023-10-02 ENCOUNTER — Other Ambulatory Visit: Payer: Self-pay | Admitting: Cardiovascular Disease

## 2023-10-03 NOTE — Telephone Encounter (Signed)
last visit on 01/28/23 with plan to f/u in 6 months, please schedule.

## 2023-10-05 DIAGNOSIS — E785 Hyperlipidemia, unspecified: Secondary | ICD-10-CM | POA: Diagnosis not present

## 2023-10-05 DIAGNOSIS — I110 Hypertensive heart disease with heart failure: Secondary | ICD-10-CM | POA: Diagnosis not present

## 2023-10-05 DIAGNOSIS — R0609 Other forms of dyspnea: Secondary | ICD-10-CM | POA: Diagnosis not present

## 2023-10-05 DIAGNOSIS — R079 Chest pain, unspecified: Secondary | ICD-10-CM | POA: Diagnosis not present

## 2023-10-05 DIAGNOSIS — I509 Heart failure, unspecified: Secondary | ICD-10-CM | POA: Diagnosis not present

## 2023-10-05 DIAGNOSIS — N401 Enlarged prostate with lower urinary tract symptoms: Secondary | ICD-10-CM | POA: Diagnosis not present

## 2023-10-05 DIAGNOSIS — Z0000 Encounter for general adult medical examination without abnormal findings: Secondary | ICD-10-CM | POA: Diagnosis not present

## 2023-10-05 DIAGNOSIS — Z23 Encounter for immunization: Secondary | ICD-10-CM | POA: Diagnosis not present

## 2023-10-05 DIAGNOSIS — Z79899 Other long term (current) drug therapy: Secondary | ICD-10-CM | POA: Diagnosis not present

## 2023-10-10 NOTE — Progress Notes (Unsigned)
Cardiology Office Note    Date:  10/11/2023   ID:  Matthew [name], DOB 1935/12/17, MRN [id number]  PCP:  [name], MD  Cardiologist:  [name], MD  Electrophysiologist:  [name], MD   Chief Complaint: Chest pain  History of Present Illness:   Matthew [name] is a 87 y.o. male with history of CAD status post CABG in 1991 with redo CABG in 2004, aortic stenosis status post TAVR in 05/2016 with postprocedure LBBB, HFrEF secondary to ICM status post CRT-P in 07/2019, PAD status post prior intervention, HTN, HLD, GI bleeding in 09/2018 with gastric AVMs noted status post therapy with acute blood loss anemia, and PVCs who presents for evaluation of chest pain.  R/LHC in 05/2016, prior to his TAVR, showed severe underlying 3-vessel CAD with patent grafts (proximal LAD 100% stenosed, mid LCx 100% stenosed, OM2 80% stenosed, proximal RCA 100% stenosed, LIMA-LAD was unable to be cannulated and was anatomically normal and normal in caliber, RIMA-D2 was visualized by non-selective angiography and was anatomically normal, VG-OM2 with a patent, previously placed proximal graft stent, 50% mid VG-OM2 stenosis, VG-OM3 minimal luminal irregularities, VG-RPDA mild diffuse disease), RHC showed high normal pulmonary pressure and filling pressure, normal cardiac output. Following this, he underwent TAVR in 05/2016. Post procedure, he developed a LBBB coupled with a gradual decline in his EF. Pre-TAVR, his EF was 55-60% with follow up study shortly after his procedure showing an EF of 55%. In 2018, his EF was noted to be 45-50% then dropped to 30-35% in 2019. Cardiac cath in 2019, performed for worsening angina, showed significant underlying three-vessel CAD with patent LIMA to LAD, patent RIMA to diagonal, patent SVG to OM3 and patent SVG to rPDA. The SVG to OM2 was found to be occluded. However, OM2 had retrograde flow from SVG to OM3. There was possible left subclavian artery stenosis but it was not  significant by gradient. This was also evaluated by carotid Doppler and there was nothing to suggest significant left subclavian stenosis.  He was admitted to the hospital in 09/2018 with GI bleed and acute blood loss anemia.  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  Anemia improved with IV iron.  Echo in 02/2019, showed an EF of 25-30%, mild aortic stenosis with a mean gradient of 17 mmHg and mild to moderate aortic insufficiency. With continued decline in his LVSF, with an underlying LBBB, he underwent CRT-P implantation in 07/2019.  Echo 08/2020 showed an improved LVSF with an EF of 40-45%, mildly dilated LV cavity size, moderate LVH, Gr1DD, elevated left atrial pressure, moderately reduced RVSF with normal RV cavity size, trivial mitral regurgitation, normal size and function of AVR.  Due to worsening angina, he underwent diagnostic cath in 08/2020 that showed significant underlying 3 vessel CAD with patent grafts including LIMA to LAD, RIMA to diagonal, SVG RCA, and SVG to OM3.  There was no significant change in his coronary anatomy when compared to prior cardiac cath.  He was noted to have native small vessel CAD which was felt to possibly be the culprit for his stable anginal symptoms.  RHC showed normal filling pressures, normal pulmonary pressure, and normal cardiac output.  Most recent echo from 12/2021 demonstrated an EF of 40 to 45% with normal functioning TAVR prosthesis and a mean gradient of 10 mmHg.  Progressive hypotension has previously necessitated the de-escalation of GDMT.  He was most recent in the office in 01/2023, and was without symptoms  of angina or cardiac decompensation.  He comes in today noting a 3-week history of of exertional chest and bilateral upper extremity pressure.  There is also been an increase in exertional fatigue and dyspnea that improves with rest.  He also reports an episode 3 weeks ago of substernal chest pressure that radiated to the bilateral arms and woke  him from sleep.  He took an 81 mg of aspirin with that episode with symptoms resolving in 10 to 15 minutes.  Pressure is more intense than what he experienced leading up to his prior bypass surgeries.  No dizziness, presyncope, or syncope.  No lower extremity swelling or progressive orthopnea.  No symptoms concerning for bleeding.  In the setting of the above symptoms, his PCP recently titrated to 90 mg daily.  Currently chest pain-free.   Labs independently reviewed: 09/2023 - Hgb 14.5, PLT 116, potassium 4.1, BUN 18, serum creatinine 1.0, albumin 4.5, AST/ALT normal, TC 144, TG 106, HDL 54, LDL 69, TSH normal  Past Medical History:  Diagnosis Date   Alcohol abuse    stopped drinking 42 years ago from today 2020   Anemia    AS (aortic stenosis)    a. Echo 6/10: EF 55% mild AS; b. echo 06/2015; EF 55-60%, GR1DD, moderate AS, Peak velocity (S): 346 cm/s. Mean gradientS): 28 mm Hg. Peak gradient (S): 48 mm Hg. Valve area (VTI): 1.18 cm2;   c. Echo 4/17 - mild LVH, EF 55-60%, no RWMA, Gr 1 DD, mod to severe AS (mean 28 mmHg, peak 46 mmHg), mild LAE   CAD (coronary artery disease)    a. MI 1996 w/ CABG 1996; b.redo CABG 06/2003; c. Myoview 7/09: EF 54% inferobasal infarct, no ischemia. Myoview 6/10 EF 43% inf wall infarct. no ischemia; c. cath 8/16 s/p DES to VG-OM2. OTW 3VD w/ patent VG->RPDA, VG->OM3,  & LIMA->LAD.  EF 55-65%.   Carotid bruit    2009 0-39% on dopplers bilatrally   Chest pain    GERD (gastroesophageal reflux disease)    History of kidney stones    HOH (hard of hearing)    HTN (hypertension)    Hyperlipidemia    Myocardial infarction (HCC) 1996   Nephrolithiasis    OSA (obstructive sleep apnea)    PAD (peripheral artery disease) (HCC) 02/2014   Subtotal occlusion of right common iliac artery and 70% stenosis in the left common iliac artery. Status post bilateral kissing stent placement. Significant post stenosis aneurysmal dilatation on the right side (any future catheterization  through the right femoral artery should be done cautiously to avoid advancing the wire behind the stent struts)   Shortness of breath    with exertion   Squamous cell carcinoma of skin 12/17/2020   R angle of mandible - SCCIS, ED&C 01/22/2021   Squamous cell carcinoma of skin 12/17/2020   R upper arm SCCIS arising in [place], ED&C 01/22/2021   Squamous cell carcinoma of skin 12/17/2020   R chest - SCCIS, ED&C 01/22/2021   Thrombocytopenia (HCC)     Past Surgical History:  Procedure Laterality Date   ABDOMINAL AORTAGRAM N/A 03/06/2014   Procedure: ABDOMINAL Ronny Flurry;  Surgeon: Iran Ouch, MD;  Location: [facility] CATH LAB;  Service: Cardiovascular;  Laterality: N/A;   BIV PACEMAKER INSERTION CRT-P N/A 07/23/2019   Procedure: BIV PACEMAKER INSERTION CRT-P;  Surgeon: Duke Salvia, MD;  Location: [facility] INVASIVE CV LAB;  Service: Cardiovascular;  Laterality: N/A;   CARDIAC CATHETERIZATION  02/2014   Severe three-vessel coronary  artery disease with patent grafts.    CARDIAC CATHETERIZATION N/A 07/31/2015   Procedure: Right/Left Heart Cath and Coronary/Graft Angiography;  Surgeon: Iran Ouch, MD;  Location: [facility] INVASIVE CV LAB;  Service: Cardiovascular;  Laterality: N/A;   CARDIAC CATHETERIZATION N/A 07/31/2015   Procedure: Coronary Stent Intervention;  Surgeon: Iran Ouch, MD;  Location: [facility] INVASIVE CV LAB;  Service: Cardiovascular;  Laterality: N/A;   CARDIAC CATHETERIZATION N/A 05/26/2016   Procedure: Right/Left Heart Cath and Coronary Angiography;  Surgeon: Iran Ouch, MD;  Location: [facility] INVASIVE CV LAB;  Service: Cardiovascular;  Laterality: N/A;   COLONOSCOPY WITH PROPOFOL N/A 10/07/2018   Procedure: COLONOSCOPY WITH PROPOFOL;  Surgeon: Toney Reil, MD;  Location: [facility] ENDOSCOPY;  Service: Gastroenterology;  Laterality: N/A;   COLONOSCOPY WITH PROPOFOL N/A 01/08/2019   Procedure: COLONOSCOPY WITH PROPOFOL;  Surgeon: Toney Reil, MD;  Location: [facility] ENDOSCOPY;   Service: Gastroenterology;  Laterality: N/A;   CORONARY ARTERY BYPASS GRAFT  1991   at [facility]. Redone 2004-3 vessels 1st time and 4 second time   CYSTO     2/3 times for kidney stones   ENTEROSCOPY N/A 12/25/2018   Procedure: ENTEROSCOPY Single balloon;  Surgeon: Toney Reil, MD;  Location: [facility] ENDOSCOPY;  Service: Gastroenterology;  Laterality: N/A;   ESOPHAGOGASTRODUODENOSCOPY (EGD) WITH PROPOFOL N/A 10/07/2018   Procedure: ESOPHAGOGASTRODUODENOSCOPY (EGD) WITH PROPOFOL;  Surgeon: Toney Reil, MD;  Location: [facility] ENDOSCOPY;  Service: Gastroenterology;  Laterality: N/A;   GIVENS CAPSULE STUDY N/A 12/22/2018   Procedure: GIVENS CAPSULE STUDY;  Surgeon: Toney Reil, MD;  Location: [facility] ENDOSCOPY;  Service: Gastroenterology;  Laterality: N/A;   INGUINAL HERNIA REPAIR  11/10/2011   Procedure: HERNIA REPAIR INGUINAL ADULT;  Surgeon: Fabio Bering;  Location: [facility] ORS;  Service: General;  Laterality: Right;   LAPAROSCOPIC RIGHT COLECTOMY Right 02/09/2019   Procedure: LAPAROSCOPIC RIGHT COLECTOMY;  Surgeon: Leafy Ro, MD;  Location: [facility] ORS;  Service: General;  Laterality: Right;   LEFT HEART CATH AND CORONARY ANGIOGRAPHY Left 05/15/2018   Procedure: LEFT HEART CATH AND CORONARY ANGIOGRAPHY;  Surgeon: Iran Ouch, MD;  Location: [facility] INVASIVE CV LAB;  Service: Cardiovascular;  Laterality: Left;   LEFT HEART CATHETERIZATION WITH CORONARY/GRAFT ANGIOGRAM N/A 03/06/2014   Procedure: LEFT HEART CATHETERIZATION WITH Isabel Caprice;  Surgeon: Iran Ouch, MD;  Location: [facility] CATH LAB;  Service: Cardiovascular;  Laterality: N/A;   RIGHT HEART CATH AND CORONARY/GRAFT ANGIOGRAPHY N/A 09/05/2020   Procedure: RIGHT HEART CATH AND CORONARY/GRAFT ANGIOGRAPHY;  Surgeon: Iran Ouch, MD;  Location: [facility] INVASIVE CV LAB;  Service: Cardiovascular;  Laterality: N/A;   STOMACH SURGERY     removal of gastric ulcers   TEE WITHOUT CARDIOVERSION N/A 06/01/2016   Procedure:  TRANSESOPHAGEAL ECHOCARDIOGRAM (TEE);  Surgeon: Tonny Bollman, MD;  Location: [facility] OR;  Service: Open Heart Surgery;  Laterality: N/A;   TRANSCATHETER AORTIC VALVE REPLACEMENT, TRANSFEMORAL N/A 06/01/2016   Procedure: TRANSCATHETER AORTIC VALVE REPLACEMENT, TRANSFEMORAL;  Surgeon: Tonny Bollman, MD;  Location: [facility] OR;  Service: Open Heart Surgery;  Laterality: N/A;    Current Medications: Current Meds  Medication Sig   acetaminophen (TYLENOL) 325 MG tablet Take 650 mg by mouth every 6 (six) hours as needed for mild pain or headache.    amitriptyline (ELAVIL) 10 MG tablet Take 1 tablet by mouth at bedtime.   aspirin EC 81 MG tablet Take 1 tablet (81 mg total) by mouth daily.   atorvastatin (LIPITOR) 80 MG tablet Take 1 tablet (  80 mg total) by mouth daily.   carvedilol (COREG) 3.125 MG tablet Take 1 tablet (3.125 mg total) by mouth 2 (two) times daily.   clopidogrel (PLAVIX) 75 MG tablet Take 1 tablet (75 mg total) by mouth daily. Overdue follow up appointment.  PLEASE CALL OFFICE TO SCHEDULE APPOINTMENT PRIOR TO NEXT REFILL   ezetimibe (ZETIA) 10 MG tablet Take 1 tablet (10 mg total) by mouth daily.   isosorbide mononitrate (IMDUR) 60 MG 24 hr tablet Take 1 tablet (60 mg total) by mouth daily. (Patient taking differently: Take 60 mg by mouth daily. 1 1/2 tabs per day)   Multiple Vitamins-Minerals (PRESERVISION AREDS PO) Take 1 tablet by mouth daily.    mupirocin ointment (BACTROBAN) 2 % Apply 1 Application topically daily.   niacinamide 500 MG tablet Take 500 mg by mouth 2 (two) times daily with a meal.   pantoprazole (PROTONIX) 40 MG tablet Take 1 tablet (40 mg total) by mouth daily.   Polypodium Leucotomos (HELIOCARE PO) Take by mouth.   sacubitril-valsartan (ENTRESTO) 24-26 MG Take 1 tablet by mouth 2 (two) times daily.   spironolactone (ALDACTONE) 25 MG tablet Take 0.5 tablets (12.5 mg total) by mouth daily.   tamsulosin (FLOMAX) 0.4 MG CAPS capsule Take 1 capsule (0.4 mg total) by mouth  daily. Future refill request will need to be sent to the pcp   tiZANidine (ZANAFLEX) 2 MG tablet Take 2 mg by mouth at bedtime as needed.   TURMERIC PO Take 1 capsule by mouth at bedtime.    vitamin B-12 (CYANOCOBALAMIN) 1000 MCG tablet Take 1,000 mcg by mouth daily.    Allergies:   Ranitidine and Ranexa [ranolazine er]   Social History   Socioeconomic History   Marital status: Married    Spouse name: Not on file   Number of children: 2   Years of education: Not on file   Highest education level: Not on file  Occupational History   Occupation: Retired  Tobacco Use   Smoking status: Former    Current packs/day: 0.00    Average packs/day: 1 pack/day for 50.0 years (50.0 ttl pk-yrs)    Types: Cigarettes    Start date: 01/25/1940    Quit date: 01/24/1990    Years since quitting: 33.7   Smokeless tobacco: Never  Vaping Use   Vaping status: Never Used  Substance and Sexual Activity   Alcohol use: No    Alcohol/week: 0.0 standard drinks of alcohol    Comment: Alcoholic in past   Drug use: No   Sexual activity: Never  Other Topics Concern   Not on file  Social History Narrative   Married an lives with wife in [place]. Retired form [employer].    Social Determinants of Health   Financial Resource Strain: Not on file  Food Insecurity: Not on file  Transportation Needs: Not on file  Physical Activity: Not on file  Stress: Not on file  Social Connections: Not on file     Family History:  The patient's family history includes Heart disease in his father and mother. There is no history of Colon cancer.  ROS:   12-point review of systems is negative unless otherwise noted in the HPI.   EKGs/Labs/Other Studies Reviewed:    Studies reviewed were summarized above. The additional studies were reviewed today:  2D echo 01/07/2022: 1. Left ventricular ejection fraction, by estimation, is 40 to 45%. The  left ventricle has mild to moderately decreased function. The  left  ventricle demonstrates  global hypokinesis. Left ventricular diastolic  parameters are consistent with Grade I  diastolic dysfunction (impaired relaxation).   2. Right ventricular systolic function is normal. The right ventricular  size is normal.   3. Left atrial size was mildly dilated.   4. Right atrial size was mildly dilated.   5. The mitral valve is degenerative. No evidence of mitral valve  regurgitation.   6. The aortic valve has been repaired/replaced. Aortic valve  regurgitation is not visualized. There is a 26 mm Sapien prosthetic (TAVR)  valve present in the aortic position. Procedure Date: 2017. Echo findings  are consistent with normal structure and  function of the aortic valve prosthesis. Aortic valve mean gradient  measures 10.0 mmHg. __________   [facility] 09/05/2020: Prox LAD lesion is 100% stenosed. 2nd Mrg lesion is 80% stenosed. Ost Cx lesion is 30% stenosed. Ost Cx to Prox Cx lesion is 100% stenosed. Mid Cx lesion is 60% stenosed. Prox RCA lesion is 100% stenosed. And is normal in caliber. The graft exhibits minimal luminal irregularities. And is normal in caliber. The graft exhibits severe focal disease. Origin to Prox Graft lesion is 100% stenosed. LIMA due to inability to cannulate and is normal in caliber. The graft exhibits no disease. SVG. Origin lesion before Acute Mrg is 20% stenosed. Ost RPDA lesion is 70% stenosed.   1.  Significant underlying three-vessel coronary artery disease with patent grafts including LIMA to LAD, RIMA to diagonal, SVG to RCA and SVG to OM 3.  No significant change in coronary anatomy since most recent cardiac catheterization.  The patient does have native small vessel coronary artery disease which might be the culprit for his stable anginal symptoms. 2.  Right heart catheterization showed normal filling pressures, normal pulmonary pressure and normal cardiac output.   Recommendations: Continue medical  therapy. __________   2D echo 09/02/2020: 1. Left ventricular ejection fraction, by estimation, is 40 to 45%. The  left ventricle has mildly decreased function. Left ventricular endocardial  border not optimally defined to evaluate regional wall motion. The left  ventricular internal cavity size  was mildly dilated. There is moderate left ventricular hypertrophy. Left  ventricular diastolic parameters are consistent with Grade I diastolic  dysfunction (impaired relaxation). Elevated left atrial pressure.   2. Right ventricular systolic function is moderately reduced. The right  ventricular size is normal. Tricuspid regurgitation signal is inadequate  for assessing PA pressure.   3. The mitral valve is normal in structure. Trivial mitral valve  regurgitation. No evidence of mitral stenosis.   4. The aortic valve has been repaired/replaced. Aortic valve  regurgitation is not visualized. Echo findings are consistent with normal  structure and function of the aortic valve prosthesis. Aortic valve mean  gradient measures 14.0 mmHg.  __________   2D echo 02/2019:  1. The left ventricle has severely reduced systolic function, with an  ejection fraction of 25-30%. The cavity size was moderately dilated. Left  ventricular diffuse hypokinesis. Unable to exclude regional wall motion  abnormality (challenging image  quality)   2. The right ventricle has normal systolic function. The cavity was  normal. There is no increase in right ventricular wall thickness. Right  ventricular systolic pressure is mildly elevated with an estimated  pressure of 38.0 mmHg.   3. Left atrial size was severely dilated.   4. Aortic valve regurgitation is mild to moderate. Mild stenosis of the  aortic valve. Mean gradient 17 mm Hg.  __________   2D echo 09/2018: - Left  ventricle: The cavity size was mildly dilated. Wall    thickness was normal. Systolic function was moderately to    severely reduced. The  estimated ejection fraction was in the    range of 30% to 35%. Wall motion was normal; there were no    regional wall motion abnormalities. Features are consistent with    a pseudonormal left ventricular filling pattern, with concomitant    abnormal relaxation and increased filling pressure (grade 2    diastolic dysfunction).  - Aortic valve: A bioprosthesis was present and functioning    normally. s/p TAVR. Mean gradient (S): 13 mm Hg.  - Mitral valve: Calcified annulus. There was mild regurgitation.  - Left atrium: The atrium was moderately dilated.  - Pulmonary arteries: Systolic pressure could not be accurately    estimated.  __________   [facility] 05/2018: Prox LAD lesion is 100% stenosed. 2nd Mrg lesion is 80% stenosed. Prox RCA lesion is 100% stenosed. And is normal in caliber. The graft exhibits minimal luminal irregularities. And is normal in caliber. The graft exhibits severe focal disease. LIMA due to inability to cannulate and is normal in caliber. The graft exhibits no disease. Origin lesion is 40% stenosed. Ost Cx to Prox Cx lesion is 100% stenosed. Ost Cx lesion is 30% stenosed. Mid Cx lesion is 60% stenosed. Origin to Prox Graft lesion is 100% stenosed.   1.  Significant underlying three-vessel coronary artery disease with patent LIMA to LAD, patent RIMA to diagonal, patent SVG to OM 3 and patent SVG to right PDA.  The SVG to OM 2 is now occluded which is a new finding.  However, OM2 gets retrograde flow from SVG to OM 3. 2.  Possible significant left subclavian artery stenosis although no gradient was noted with pullback.   Recommendations: Overall difficult procedure due to significant tortuosity of the innominate artery and left subclavian artery.  No coronary revascularization is needed.  We have to investigate the significance of left subclavian artery stenosis which might be causing decreased flow into the LIMA to LAD.  Recommend carotid Doppler with focus on the  left subclavian artery.  Endovascular intervention of the left subclavian artery is not straightforward due to tortuosity and also close origin to the LIMA. The patient is noted to have frequent PVCs before and throughout the cath.  This might be contributing to his cardiomyopathy.  Recommend a 24-hour Holter monitor. __________   24-hour Holter 05/2018: 5600 PVCs in 24 hours representing 6% burden. Occasional PACs. Average heart rate of 62 bpm. __________   2D echo 04/2018: - Left ventricle: The cavity size was normal. Systolic function was    moderately to severely reduced. The estimated ejection fraction    was in the range of 30% to 35%. Diffuse hypokinesis. Regional    wall motion abnormalities cannot be excluded. Features are    consistent with a pseudonormal left ventricular filling pattern,    with concomitant abnormal relaxation and increased filling    pressure (grade 2 diastolic dysfunction).  - Aortic valve: A bioprosthesis was present. s/p TAVR Peak velocity    (S): 250 cm/s. Mean gradient (S): 14 mm Hg.  - Aortic root: The aortic root was mildly dilated.  - Mitral valve: There was mild regurgitation.  - Left atrium: The atrium was moderately dilated.  - Right ventricle: Systolic function was normal.  - Pulmonary arteries: Systolic pressure was within the normal    range.  __________   2D echo 05/2017: - Left ventricle:  The cavity size was mildly dilated. There was    mild concentric hypertrophy. Systolic function was mildly    reduced. The estimated ejection fraction was in the range of 45%    to 50%. Doppler parameters are consistent with abnormal left    ventricular relaxation (grade 1 diastolic dysfunction).  - Ventricular septum: Septal motion showed abnormal function and    dyssynergy.  - Aortic valve: A TAVR prosthesis was present and functioning    normally. Mean gradient (S): 11 mm Hg. Peak gradient (S): 20 mm    Hg. Valve area (VTI): 1.79 cm^2.  - Left  atrium: The atrium was moderately to severely dilated.  - Right atrium: The atrium was mildly dilated.   Impressions:   - Comapred to most recent echo, the LV is mildly more dilated with    mildly reduced EF.  __________   2D echo 06/2016: - Left ventricle: LVEF is approximately 55% with mid/distal    inferior hypokiensis; septal hypokdinesis; basal inferior    akinesis with aneurysmal dilitation. The cavity size was mildly    dilated. Wall thickness was normal. Doppler parameters are    consistent with abnormal left ventricular relaxation (grade 1    diastolic dysfunction).  - Aortic valve: AV prosthesis is well seated Peak and mean    gradients through the valve are 19 and 11 mm Hg respectively.  - Mitral valve: Calcified annulus. Mildly thickened leaflets .  - Left atrium: The atrium was severely dilated.  __________   2D echo 05/2016: - Left ventricle: Posterior basal akinesis The cavity size was    mildly dilated. Systolic function was normal. The estimated    ejection fraction was in the range of 50% to 55%. Doppler    parameters are consistent with both elevated ventricular    end-diastolic filling pressure and elevated left atrial filling    pressure.  - Aortic valve: Normal appearing 26 mm Sapien 3 valve with no    significant perivalvular regurgitation post TAVR. Valve area    (VTI): 0.81 cm^2. Valve area (Vmax): 0.83 cm^2. Valve area    (Vmean): 0.85 cm^2.  - Left atrium: The atrium was moderately dilated.  - Atrial septum: No defect or patent foramen ovale was identified. __________   TEE 05/2016: - Aortic valve: Valve area (VTI): 1.94 cm^2. Valve area (Vmax):    1.99 cm^2. Valve area (Vmean): 2.06 cm^2.  - Impressions: Pre-TAVR: EF 50-55% posterior basal akinesis. No    effusion. Mild MR. Normal RV with mild TR. No ASD/PFO. Aortic    valve trileaflet    and severely calcified. Peak velocity 4.3 m/sec, peak gradient 73    mmHg mean gradient 45 mmHg. Mild to  moderate AR.       Post TAVR: Slightly high plaecement of 26 mm Sapien 3 valve.    Trivial perivalvular regurgitation at 2:00 on SA views. Peak    gradient 4 mmHg    mean gradient 2 mmHg peak velocity .98 m/sec AVA 2.36 cm2. EF    remains 50-55% with posteior basal akinesis No new RWMA;s Mild MR    RV normal with no increase in PA pressures No new effusion aorti    root intact.   Impressions:   - Pre-TAVR: EF 50-55% posterior basal akinesis. No effusion. Mild    MR. Normal RV with mild TR. No ASD/PFO. Aortic valve trileaflet    and severely calcified. Peak velocity 4.3 m/sec, peak gradient 73    mmHg mean gradient 45  mmHg. Mild to moderate AR. __________   [facility] 05/2016: Prox RCA lesion, 100% stenosed. SVG was injected is normal in caliber. There is mild diffuse disease in the graft. Mid Cx lesion, 100% stenosed. SVG was injected is normal in caliber. The graft exhibits minimal luminal irregularities. was injected is normal in caliber. There is severe focal disease in the graft. 2nd Mrg lesion, 80% stenosed. LIMA due to inability to cannulate and is normal in caliber, and is anatomically normal. Prox LAD lesion, 100% stenosed. Mid Graft lesion, 50% stenosed. RIMA was visualized by non-selective angiography is and is anatomically normal.   1. Right heart catheterization showed high normal pulmonary pressure and filling pressures. Normal cardiac output. 2. Severe underlying three-vessel coronary artery disease with patent grafts. 3. Moderate left subclavian stenosis not significant by gradients.   Recommendations: No revascularization is needed before TAVR.    EKG:  EKG is ordered today.  The EKG ordered today demonstrates A-sensed, V-paced rhythm with occasional AV pacing, 77 bpm  Recent Labs: No results found for requested labs within last 365 days.  Recent Lipid Panel    Component Value Date/Time   CHOL 144 01/07/2022 0754   TRIG 120 01/07/2022 0754   HDL 56  01/07/2022 0754   CHOLHDL 2.6 01/07/2022 0754   CHOLHDL 3.0 12/18/2020 1635   VLDL 23 12/18/2020 1635   LDLCALC 67 01/07/2022 0754   LDLDIRECT 83.3 12/18/2020 1635    PHYSICAL EXAM:    VS:  BP 108/63 (BP Location: Left Arm, Patient Position: Sitting, Cuff Size: Normal)   Pulse 77   Ht 5\' 9"  (1.753 m)   Wt 191 lb 9.6 oz (86.9 kg)   SpO2 97%   BMI 28.29 kg/m   BMI: Body mass index is 28.29 kg/m.  Physical Exam Vitals reviewed.  Constitutional:      Appearance: He is well-developed.  HENT:     Head: Normocephalic and atraumatic.  Eyes:     General:        Right eye: No discharge.        Left eye: No discharge.  Neck:     Vascular: No JVD.  Cardiovascular:     Rate and Rhythm: Normal rate and regular rhythm.     Heart sounds: S1 normal and S2 normal. Heart sounds not distant. No midsystolic click and no opening snap. Murmur heard.     Systolic murmur is present with a grade of 2/6 at the upper right sternal border.     No friction rub.  Pulmonary:     Effort: Pulmonary effort is normal. No respiratory distress.     Breath sounds: Normal breath sounds. No decreased breath sounds, wheezing, rhonchi or rales.  Chest:     Chest wall: No tenderness.  Abdominal:     General: There is no distension.  Musculoskeletal:     Cervical back: Normal range of motion.     Right lower leg: No edema.     Left lower leg: No edema.  Skin:    General: Skin is warm and dry.     Nails: There is no clubbing.  Neurological:     Mental Status: He is alert and oriented to person, place, and time.  Psychiatric:        Speech: Speech normal.        Behavior: Behavior normal.        Thought Content: Thought content normal.        Judgment: Judgment normal.  Wt Readings from Last 3 Encounters:  10/11/23 191 lb 9.6 oz (86.9 kg)  07/20/23 195 lb (88.5 kg)  06/21/23 195 lb (88.5 kg)     ASSESSMENT & PLAN:   CAD involving bypass graft with unstable angina: Currently chest pain-free.   Reports a 3-week history of exertional chest and bilateral upper extremity pressure.  Also notes 1 episode of chest pressure that woke him from sleep 3 weeks ago.  Pressure is more intense than what he experienced leading up to his bypass surgeries.  We discussed noninvasive versus invasive ischemic testing, and based on his symptoms and cardiac history, we feel it is appropriate to move directly with cardiac catheterization.  Continue aggressive risk factor modification and secondary prevention including aspirin, clopidogrel, atorvastatin, carvedilol, ezetimibe, and Imdur.  HFrEF secondary to ICM with LBBB status post CRT-P: He appears euvolemic and well compensated.  Relative hypotension has previously led to the de-escalation of GDMT.  Remains on low-dose carvedilol, Entresto, and spironolactone.  Obtain echo as outlined below.  Follow-up with EP as directed.  Severe aortic stenosis status post TAVR: Most recent echo showed normal functioning prosthesis with a mean gradient of 10 mmHg.  Obtain echo.  Remains on aspirin.  SBE prophylaxis is indicated for dental procedures.  HTN: Blood pressure is well-controlled in the office today.  Continue medical therapy as outlined above.  HLD: LDL 69 in 09/2023 with normal AST/ALT at that time.  He remains on atorvastatin and ezetimibe.  PAD: No symptoms of recurrent claudication following bilateral iliac stenting.  Remains on aspirin and statin as outlined above.   Informed Consent   Shared Decision Making/Informed Consent{  The risks [stroke (1 in 1000), death (1 in 1000), kidney failure [usually temporary] (1 in 500), bleeding (1 in 200), allergic reaction [possibly serious] (1 in 200)], benefits (diagnostic support and management of coronary artery disease) and alternatives of a cardiac catheterization were discussed in detail with Matthew [name] and he is willing to proceed.        Disposition: F/u with Dr. Kirke Corin or an APP 1-2 weeks after  LHC.   Medication Adjustments/Labs and Tests Ordered: Current medicines are reviewed at length with the patient today.  Concerns regarding medicines are outlined above. Medication changes, Labs and Tests ordered today are summarized above and listed in the Patient Instructions accessible in Encounters.   Signed, Eula Listen, PA-C 10/11/2023 12:52 PM     [facility] [place] [address] [place], [place] [address] [phone]

## 2023-10-10 NOTE — H&P (View-Only) (Signed)
Cardiology Office Note    Date:  10/11/2023   ID:  Matthew [name], DOB 11/13/1936, MRN [id number]  PCP:  [name], MD  Cardiologist:  [name], MD  Electrophysiologist:  [name], MD   Chief Complaint: Chest pain  History of Present Illness:   Matthew [name] is a 87 y.o. male with history of CAD status post CABG in 1991 with redo CABG in 2004, aortic stenosis status post TAVR in 05/2016 with postprocedure LBBB, HFrEF secondary to ICM status post CRT-P in 07/2019, PAD status post prior intervention, HTN, HLD, GI bleeding in 09/2018 with gastric AVMs noted status post therapy with acute blood loss anemia, and PVCs who presents for evaluation of chest pain.  R/LHC in 05/2016, prior to his TAVR, showed severe underlying 3-vessel CAD with patent grafts (proximal LAD 100% stenosed, mid LCx 100% stenosed, OM2 80% stenosed, proximal RCA 100% stenosed, LIMA-LAD was unable to be cannulated and was anatomically normal and normal in caliber, RIMA-D2 was visualized by non-selective angiography and was anatomically normal, VG-OM2 with a patent, previously placed proximal graft stent, 50% mid VG-OM2 stenosis, VG-OM3 minimal luminal irregularities, VG-RPDA mild diffuse disease), RHC showed high normal pulmonary pressure and filling pressure, normal cardiac output. Following this, he underwent TAVR in 05/2016. Post procedure, he developed a LBBB coupled with a gradual decline in his EF. Pre-TAVR, his EF was 55-60% with follow up study shortly after his procedure showing an EF of 55%. In 2018, his EF was noted to be 45-50% then dropped to 30-35% in 2019. Cardiac cath in 2019, performed for worsening angina, showed significant underlying three-vessel CAD with patent LIMA to LAD, patent RIMA to diagonal, patent SVG to OM3 and patent SVG to rPDA. The SVG to OM2 was found to be occluded. However, OM2 had retrograde flow from SVG to OM3. There was possible left subclavian artery stenosis but it was not  significant by gradient. This was also evaluated by carotid Doppler and there was nothing to suggest significant left subclavian stenosis.  He was admitted to the hospital in 09/2018 with GI bleed and acute blood loss anemia.  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  Anemia improved with IV iron.  Echo in 02/2019, showed an EF of 25-30%, mild aortic stenosis with a mean gradient of 17 mmHg and mild to moderate aortic insufficiency. With continued decline in his LVSF, with an underlying LBBB, he underwent CRT-P implantation in 07/2019.  Echo 08/2020 showed an improved LVSF with an EF of 40-45%, mildly dilated LV cavity size, moderate LVH, Gr1DD, elevated left atrial pressure, moderately reduced RVSF with normal RV cavity size, trivial mitral regurgitation, normal size and function of AVR.  Due to worsening angina, he underwent diagnostic cath in 08/2020 that showed significant underlying 3 vessel CAD with patent grafts including LIMA to LAD, RIMA to diagonal, SVG RCA, and SVG to OM3.  There was no significant change in his coronary anatomy when compared to prior cardiac cath.  He was noted to have native small vessel CAD which was felt to possibly be the culprit for his stable anginal symptoms.  RHC showed normal filling pressures, normal pulmonary pressure, and normal cardiac output.  Most recent echo from 12/2021 demonstrated an EF of 40 to 45% with normal functioning TAVR prosthesis and a mean gradient of 10 mmHg.  Progressive hypotension has previously necessitated the de-escalation of GDMT.  He was most recent in the office in 01/2023, and was without symptoms  of angina or cardiac decompensation.  He comes in today noting a 3-week history of of exertional chest and bilateral upper extremity pressure.  There is also been an increase in exertional fatigue and dyspnea that improves with rest.  He also reports an episode 3 weeks ago of substernal chest pressure that radiated to the bilateral arms and woke  him from sleep.  He took an 81 mg of aspirin with that episode with symptoms resolving in 10 to 15 minutes.  Pressure is more intense than what he experienced leading up to his prior bypass surgeries.  No dizziness, presyncope, or syncope.  No lower extremity swelling or progressive orthopnea.  No symptoms concerning for bleeding.  In the setting of the above symptoms, his PCP recently titrated to 90 mg daily.  Currently chest pain-free.   Labs independently reviewed: 09/2023 - Hgb 14.5, PLT 116, potassium 4.1, BUN 18, serum creatinine 1.0, albumin 4.5, AST/ALT normal, TC 144, TG 106, HDL 54, LDL 69, TSH normal  Past Medical History:  Diagnosis Date   Alcohol abuse    stopped drinking 42 years ago from today 2020   Anemia    AS (aortic stenosis)    a. Echo 6/10: EF 55% mild AS; b. echo 06/2015; EF 55-60%, GR1DD, moderate AS, Peak velocity (S): 346 cm/s. Mean gradientS): 28 mm Hg. Peak gradient (S): 48 mm Hg. Valve area (VTI): 1.18 cm2;   c. Echo 4/17 - mild LVH, EF 55-60%, no RWMA, Gr 1 DD, mod to severe AS (mean 28 mmHg, peak 46 mmHg), mild LAE   CAD (coronary artery disease)    a. MI 1996 w/ CABG 1996; b.redo CABG 06/2003; c. Myoview 7/09: EF 54% inferobasal infarct, no ischemia. Myoview 6/10 EF 43% inf wall infarct. no ischemia; c. cath 8/16 s/p DES to VG-OM2. OTW 3VD w/ patent VG->RPDA, VG->OM3,  & LIMA->LAD.  EF 55-65%.   Carotid bruit    2009 0-39% on dopplers bilatrally   Chest pain    GERD (gastroesophageal reflux disease)    History of kidney stones    HOH (hard of hearing)    HTN (hypertension)    Hyperlipidemia    Myocardial infarction (HCC) 1996   Nephrolithiasis    OSA (obstructive sleep apnea)    PAD (peripheral artery disease) (HCC) 02/2014   Subtotal occlusion of right common iliac artery and 70% stenosis in the left common iliac artery. Status post bilateral kissing stent placement. Significant post stenosis aneurysmal dilatation on the right side (any future catheterization  through the right femoral artery should be done cautiously to avoid advancing the wire behind the stent struts)   Shortness of breath    with exertion   Squamous cell carcinoma of skin 12/17/2020   R angle of mandible - SCCIS, ED&C 01/22/2021   Squamous cell carcinoma of skin 12/17/2020   R upper arm SCCIS arising in [place], ED&C 01/22/2021   Squamous cell carcinoma of skin 12/17/2020   R chest - SCCIS, ED&C 01/22/2021   Thrombocytopenia (HCC)     Past Surgical History:  Procedure Laterality Date   ABDOMINAL AORTAGRAM N/A 03/06/2014   Procedure: ABDOMINAL Ronny Flurry;  Surgeon: Iran Ouch, MD;  Location: [facility] CATH LAB;  Service: Cardiovascular;  Laterality: N/A;   BIV PACEMAKER INSERTION CRT-P N/A 07/23/2019   Procedure: BIV PACEMAKER INSERTION CRT-P;  Surgeon: Duke Salvia, MD;  Location: [facility] INVASIVE CV LAB;  Service: Cardiovascular;  Laterality: N/A;   CARDIAC CATHETERIZATION  02/2014   Severe three-vessel coronary  artery disease with patent grafts.    CARDIAC CATHETERIZATION N/A 07/31/2015   Procedure: Right/Left Heart Cath and Coronary/Graft Angiography;  Surgeon: Iran Ouch, MD;  Location: [facility] INVASIVE CV LAB;  Service: Cardiovascular;  Laterality: N/A;   CARDIAC CATHETERIZATION N/A 07/31/2015   Procedure: Coronary Stent Intervention;  Surgeon: Iran Ouch, MD;  Location: [facility] INVASIVE CV LAB;  Service: Cardiovascular;  Laterality: N/A;   CARDIAC CATHETERIZATION N/A 05/26/2016   Procedure: Right/Left Heart Cath and Coronary Angiography;  Surgeon: Iran Ouch, MD;  Location: [facility] INVASIVE CV LAB;  Service: Cardiovascular;  Laterality: N/A;   COLONOSCOPY WITH PROPOFOL N/A 10/07/2018   Procedure: COLONOSCOPY WITH PROPOFOL;  Surgeon: Toney Reil, MD;  Location: [facility] ENDOSCOPY;  Service: Gastroenterology;  Laterality: N/A;   COLONOSCOPY WITH PROPOFOL N/A 01/08/2019   Procedure: COLONOSCOPY WITH PROPOFOL;  Surgeon: Toney Reil, MD;  Location: [facility] ENDOSCOPY;   Service: Gastroenterology;  Laterality: N/A;   CORONARY ARTERY BYPASS GRAFT  1991   at [facility]. Redone 2004-3 vessels 1st time and 4 second time   CYSTO     2/3 times for kidney stones   ENTEROSCOPY N/A 12/25/2018   Procedure: ENTEROSCOPY Single balloon;  Surgeon: Toney Reil, MD;  Location: [facility] ENDOSCOPY;  Service: Gastroenterology;  Laterality: N/A;   ESOPHAGOGASTRODUODENOSCOPY (EGD) WITH PROPOFOL N/A 10/07/2018   Procedure: ESOPHAGOGASTRODUODENOSCOPY (EGD) WITH PROPOFOL;  Surgeon: Toney Reil, MD;  Location: [facility] ENDOSCOPY;  Service: Gastroenterology;  Laterality: N/A;   GIVENS CAPSULE STUDY N/A 12/22/2018   Procedure: GIVENS CAPSULE STUDY;  Surgeon: Toney Reil, MD;  Location: [facility] ENDOSCOPY;  Service: Gastroenterology;  Laterality: N/A;   INGUINAL HERNIA REPAIR  11/10/2011   Procedure: HERNIA REPAIR INGUINAL ADULT;  Surgeon: Fabio Bering;  Location: [facility] ORS;  Service: General;  Laterality: Right;   LAPAROSCOPIC RIGHT COLECTOMY Right 02/09/2019   Procedure: LAPAROSCOPIC RIGHT COLECTOMY;  Surgeon: Leafy Ro, MD;  Location: [facility] ORS;  Service: General;  Laterality: Right;   LEFT HEART CATH AND CORONARY ANGIOGRAPHY Left 05/15/2018   Procedure: LEFT HEART CATH AND CORONARY ANGIOGRAPHY;  Surgeon: Iran Ouch, MD;  Location: [facility] INVASIVE CV LAB;  Service: Cardiovascular;  Laterality: Left;   LEFT HEART CATHETERIZATION WITH CORONARY/GRAFT ANGIOGRAM N/A 03/06/2014   Procedure: LEFT HEART CATHETERIZATION WITH Isabel Caprice;  Surgeon: Iran Ouch, MD;  Location: [facility] CATH LAB;  Service: Cardiovascular;  Laterality: N/A;   RIGHT HEART CATH AND CORONARY/GRAFT ANGIOGRAPHY N/A 09/05/2020   Procedure: RIGHT HEART CATH AND CORONARY/GRAFT ANGIOGRAPHY;  Surgeon: Iran Ouch, MD;  Location: [facility] INVASIVE CV LAB;  Service: Cardiovascular;  Laterality: N/A;   STOMACH SURGERY     removal of gastric ulcers   TEE WITHOUT CARDIOVERSION N/A 06/01/2016   Procedure:  TRANSESOPHAGEAL ECHOCARDIOGRAM (TEE);  Surgeon: Tonny Bollman, MD;  Location: [facility] OR;  Service: Open Heart Surgery;  Laterality: N/A;   TRANSCATHETER AORTIC VALVE REPLACEMENT, TRANSFEMORAL N/A 06/01/2016   Procedure: TRANSCATHETER AORTIC VALVE REPLACEMENT, TRANSFEMORAL;  Surgeon: Tonny Bollman, MD;  Location: [facility] OR;  Service: Open Heart Surgery;  Laterality: N/A;    Current Medications: Current Meds  Medication Sig   acetaminophen (TYLENOL) 325 MG tablet Take 650 mg by mouth every 6 (six) hours as needed for mild pain or headache.    amitriptyline (ELAVIL) 10 MG tablet Take 1 tablet by mouth at bedtime.   aspirin EC 81 MG tablet Take 1 tablet (81 mg total) by mouth daily.   atorvastatin (LIPITOR) 80 MG tablet Take 1 tablet (  80 mg total) by mouth daily.   carvedilol (COREG) 3.125 MG tablet Take 1 tablet (3.125 mg total) by mouth 2 (two) times daily.   clopidogrel (PLAVIX) 75 MG tablet Take 1 tablet (75 mg total) by mouth daily. Overdue follow up appointment.  PLEASE CALL OFFICE TO SCHEDULE APPOINTMENT PRIOR TO NEXT REFILL   ezetimibe (ZETIA) 10 MG tablet Take 1 tablet (10 mg total) by mouth daily.   isosorbide mononitrate (IMDUR) 60 MG 24 hr tablet Take 1 tablet (60 mg total) by mouth daily. (Patient taking differently: Take 60 mg by mouth daily. 1 1/2 tabs per day)   Multiple Vitamins-Minerals (PRESERVISION AREDS PO) Take 1 tablet by mouth daily.    mupirocin ointment (BACTROBAN) 2 % Apply 1 Application topically daily.   niacinamide 500 MG tablet Take 500 mg by mouth 2 (two) times daily with a meal.   pantoprazole (PROTONIX) 40 MG tablet Take 1 tablet (40 mg total) by mouth daily.   Polypodium Leucotomos (HELIOCARE PO) Take by mouth.   sacubitril-valsartan (ENTRESTO) 24-26 MG Take 1 tablet by mouth 2 (two) times daily.   spironolactone (ALDACTONE) 25 MG tablet Take 0.5 tablets (12.5 mg total) by mouth daily.   tamsulosin (FLOMAX) 0.4 MG CAPS capsule Take 1 capsule (0.4 mg total) by mouth  daily. Future refill request will need to be sent to the pcp   tiZANidine (ZANAFLEX) 2 MG tablet Take 2 mg by mouth at bedtime as needed.   TURMERIC PO Take 1 capsule by mouth at bedtime.    vitamin B-12 (CYANOCOBALAMIN) 1000 MCG tablet Take 1,000 mcg by mouth daily.    Allergies:   Ranitidine and Ranexa [ranolazine er]   Social History   Socioeconomic History   Marital status: Married    Spouse name: Not on file   Number of children: 2   Years of education: Not on file   Highest education level: Not on file  Occupational History   Occupation: Retired  Tobacco Use   Smoking status: Former    Current packs/day: 0.00    Average packs/day: 1 pack/day for 50.0 years (50.0 ttl pk-yrs)    Types: Cigarettes    Start date: 01/25/1940    Quit date: 01/24/1990    Years since quitting: 33.7   Smokeless tobacco: Never  Vaping Use   Vaping status: Never Used  Substance and Sexual Activity   Alcohol use: No    Alcohol/week: 0.0 standard drinks of alcohol    Comment: Alcoholic in past   Drug use: No   Sexual activity: Never  Other Topics Concern   Not on file  Social History Narrative   Married an lives with wife in [place]. Retired form [employer].    Social Determinants of Health   Financial Resource Strain: Not on file  Food Insecurity: Not on file  Transportation Needs: Not on file  Physical Activity: Not on file  Stress: Not on file  Social Connections: Not on file     Family History:  The patient's family history includes Heart disease in his father and mother. There is no history of Colon cancer.  ROS:   12-point review of systems is negative unless otherwise noted in the HPI.   EKGs/Labs/Other Studies Reviewed:    Studies reviewed were summarized above. The additional studies were reviewed today:  2D echo 01/07/2022: 1. Left ventricular ejection fraction, by estimation, is 40 to 45%. The  left ventricle has mild to moderately decreased function. The  left  ventricle demonstrates  global hypokinesis. Left ventricular diastolic  parameters are consistent with Grade I  diastolic dysfunction (impaired relaxation).   2. Right ventricular systolic function is normal. The right ventricular  size is normal.   3. Left atrial size was mildly dilated.   4. Right atrial size was mildly dilated.   5. The mitral valve is degenerative. No evidence of mitral valve  regurgitation.   6. The aortic valve has been repaired/replaced. Aortic valve  regurgitation is not visualized. There is a 26 mm Sapien prosthetic (TAVR)  valve present in the aortic position. Procedure Date: 2017. Echo findings  are consistent with normal structure and  function of the aortic valve prosthesis. Aortic valve mean gradient  measures 10.0 mmHg. __________   [facility] 09/05/2020: Prox LAD lesion is 100% stenosed. 2nd Mrg lesion is 80% stenosed. Ost Cx lesion is 30% stenosed. Ost Cx to Prox Cx lesion is 100% stenosed. Mid Cx lesion is 60% stenosed. Prox RCA lesion is 100% stenosed. And is normal in caliber. The graft exhibits minimal luminal irregularities. And is normal in caliber. The graft exhibits severe focal disease. Origin to Prox Graft lesion is 100% stenosed. LIMA due to inability to cannulate and is normal in caliber. The graft exhibits no disease. SVG. Origin lesion before Acute Mrg is 20% stenosed. Ost RPDA lesion is 70% stenosed.   1.  Significant underlying three-vessel coronary artery disease with patent grafts including LIMA to LAD, RIMA to diagonal, SVG to RCA and SVG to OM 3.  No significant change in coronary anatomy since most recent cardiac catheterization.  The patient does have native small vessel coronary artery disease which might be the culprit for his stable anginal symptoms. 2.  Right heart catheterization showed normal filling pressures, normal pulmonary pressure and normal cardiac output.   Recommendations: Continue medical  therapy. __________   2D echo 09/02/2020: 1. Left ventricular ejection fraction, by estimation, is 40 to 45%. The  left ventricle has mildly decreased function. Left ventricular endocardial  border not optimally defined to evaluate regional wall motion. The left  ventricular internal cavity size  was mildly dilated. There is moderate left ventricular hypertrophy. Left  ventricular diastolic parameters are consistent with Grade I diastolic  dysfunction (impaired relaxation). Elevated left atrial pressure.   2. Right ventricular systolic function is moderately reduced. The right  ventricular size is normal. Tricuspid regurgitation signal is inadequate  for assessing PA pressure.   3. The mitral valve is normal in structure. Trivial mitral valve  regurgitation. No evidence of mitral stenosis.   4. The aortic valve has been repaired/replaced. Aortic valve  regurgitation is not visualized. Echo findings are consistent with normal  structure and function of the aortic valve prosthesis. Aortic valve mean  gradient measures 14.0 mmHg.  __________   2D echo 02/2019:  1. The left ventricle has severely reduced systolic function, with an  ejection fraction of 25-30%. The cavity size was moderately dilated. Left  ventricular diffuse hypokinesis. Unable to exclude regional wall motion  abnormality (challenging image  quality)   2. The right ventricle has normal systolic function. The cavity was  normal. There is no increase in right ventricular wall thickness. Right  ventricular systolic pressure is mildly elevated with an estimated  pressure of 38.0 mmHg.   3. Left atrial size was severely dilated.   4. Aortic valve regurgitation is mild to moderate. Mild stenosis of the  aortic valve. Mean gradient 17 mm Hg.  __________   2D echo 09/2018: - Left  ventricle: The cavity size was mildly dilated. Wall    thickness was normal. Systolic function was moderately to    severely reduced. The  estimated ejection fraction was in the    range of 30% to 35%. Wall motion was normal; there were no    regional wall motion abnormalities. Features are consistent with    a pseudonormal left ventricular filling pattern, with concomitant    abnormal relaxation and increased filling pressure (grade 2    diastolic dysfunction).  - Aortic valve: A bioprosthesis was present and functioning    normally. s/p TAVR. Mean gradient (S): 13 mm Hg.  - Mitral valve: Calcified annulus. There was mild regurgitation.  - Left atrium: The atrium was moderately dilated.  - Pulmonary arteries: Systolic pressure could not be accurately    estimated.  __________   [facility]HC 05/2018: Prox LAD lesion is 100% stenosed. 2nd Mrg lesion is 80% stenosed. Prox RCA lesion is 100% stenosed. And is normal in caliber. The graft exhibits minimal luminal irregularities. And is normal in caliber. The graft exhibits severe focal disease. LIMA due to inability to cannulate and is normal in caliber. The graft exhibits no disease. Origin lesion is 40% stenosed. Ost Cx to Prox Cx lesion is 100% stenosed. Ost Cx lesion is 30% stenosed. Mid Cx lesion is 60% stenosed. Origin to Prox Graft lesion is 100% stenosed.   1.  Significant underlying three-vessel coronary artery disease with patent LIMA to LAD, patent RIMA to diagonal, patent SVG to OM 3 and patent SVG to right PDA.  The SVG to OM 2 is now occluded which is a new finding.  However, OM2 gets retrograde flow from SVG to OM 3. 2.  Possible significant left subclavian artery stenosis although no gradient was noted with pullback.   Recommendations: Overall difficult procedure due to significant tortuosity of the innominate artery and left subclavian artery.  No coronary revascularization is needed.  We have to investigate the significance of left subclavian artery stenosis which might be causing decreased flow into the LIMA to LAD.  Recommend carotid Doppler with focus on the  left subclavian artery.  Endovascular intervention of the left subclavian artery is not straightforward due to tortuosity and also close origin to the LIMA. The patient is noted to have frequent PVCs before and throughout the cath.  This might be contributing to his cardiomyopathy.  Recommend a 24-hour Holter monitor. __________   24-hour Holter 05/2018: 5600 PVCs in 24 hours representing 6% burden. Occasional PACs. Average heart rate of 62 bpm. __________   2D echo 04/2018: - Left ventricle: The cavity size was normal. Systolic function was    moderately to severely reduced. The estimated ejection fraction    was in the range of 30% to 35%. Diffuse hypokinesis. Regional    wall motion abnormalities cannot be excluded. Features are    consistent with a pseudonormal left ventricular filling pattern,    with concomitant abnormal relaxation and increased filling    pressure (grade 2 diastolic dysfunction).  - Aortic valve: A bioprosthesis was present. s/p TAVR Peak velocity    (S): 250 cm/s. Mean gradient (S): 14 mm Hg.  - Aortic root: The aortic root was mildly dilated.  - Mitral valve: There was mild regurgitation.  - Left atrium: The atrium was moderately dilated.  - Right ventricle: Systolic function was normal.  - Pulmonary arteries: Systolic pressure was within the normal    range.  __________   2D echo 05/2017: - Left ventricle:  The cavity size was mildly dilated. There was    mild concentric hypertrophy. Systolic function was mildly    reduced. The estimated ejection fraction was in the range of 45%    to 50%. Doppler parameters are consistent with abnormal left    ventricular relaxation (grade 1 diastolic dysfunction).  - Ventricular septum: Septal motion showed abnormal function and    dyssynergy.  - Aortic valve: A TAVR prosthesis was present and functioning    normally. Mean gradient (S): 11 mm Hg. Peak gradient (S): 20 mm    Hg. Valve area (VTI): 1.79 cm^2.  - Left  atrium: The atrium was moderately to severely dilated.  - Right atrium: The atrium was mildly dilated.   Impressions:   - Comapred to most recent echo, the LV is mildly more dilated with    mildly reduced EF.  __________   2D echo 06/2016: - Left ventricle: LVEF is approximately 55% with mid/distal    inferior hypokiensis; septal hypokdinesis; basal inferior    akinesis with aneurysmal dilitation. The cavity size was mildly    dilated. Wall thickness was normal. Doppler parameters are    consistent with abnormal left ventricular relaxation (grade 1    diastolic dysfunction).  - Aortic valve: AV prosthesis is well seated Peak and mean    gradients through the valve are 19 and 11 mm Hg respectively.  - Mitral valve: Calcified annulus. Mildly thickened leaflets .  - Left atrium: The atrium was severely dilated.  __________   2D echo 05/2016: - Left ventricle: Posterior basal akinesis The cavity size was    mildly dilated. Systolic function was normal. The estimated    ejection fraction was in the range of 50% to 55%. Doppler    parameters are consistent with both elevated ventricular    end-diastolic filling pressure and elevated left atrial filling    pressure.  - Aortic valve: Normal appearing 26 mm Sapien 3 valve with no    significant perivalvular regurgitation post TAVR. Valve area    (VTI): 0.81 cm^2. Valve area (Vmax): 0.83 cm^2. Valve area    (Vmean): 0.85 cm^2.  - Left atrium: The atrium was moderately dilated.  - Atrial septum: No defect or patent foramen ovale was identified. __________   TEE 05/2016: - Aortic valve: Valve area (VTI): 1.94 cm^2. Valve area (Vmax):    1.99 cm^2. Valve area (Vmean): 2.06 cm^2.  - Impressions: Pre-TAVR: EF 50-55% posterior basal akinesis. No    effusion. Mild MR. Normal RV with mild TR. No ASD/PFO. Aortic    valve trileaflet    and severely calcified. Peak velocity 4.3 m/sec, peak gradient 73    mmHg mean gradient 45 mmHg. Mild to  moderate AR.       Post TAVR: Slightly high plaecement of 26 mm Sapien 3 valve.    Trivial perivalvular regurgitation at 2:00 on SA views. Peak    gradient 4 mmHg    mean gradient 2 mmHg peak velocity .98 m/sec AVA 2.36 cm2. EF    remains 50-55% with posteior basal akinesis No new RWMA;s Mild MR    RV normal with no increase in PA pressures No new effusion aorti    root intact.   Impressions:   - Pre-TAVR: EF 50-55% posterior basal akinesis. No effusion. Mild    MR. Normal RV with mild TR. No ASD/PFO. Aortic valve trileaflet    and severely calcified. Peak velocity 4.3 m/sec, peak gradient 73    mmHg mean gradient 45  mmHg. Mild to moderate AR. __________   [facility] 05/2016: Prox RCA lesion, 100% stenosed. SVG was injected is normal in caliber. There is mild diffuse disease in the graft. Mid Cx lesion, 100% stenosed. SVG was injected is normal in caliber. The graft exhibits minimal luminal irregularities. was injected is normal in caliber. There is severe focal disease in the graft. 2nd Mrg lesion, 80% stenosed. LIMA due to inability to cannulate and is normal in caliber, and is anatomically normal. Prox LAD lesion, 100% stenosed. Mid Graft lesion, 50% stenosed. RIMA was visualized by non-selective angiography is and is anatomically normal.   1. Right heart catheterization showed high normal pulmonary pressure and filling pressures. Normal cardiac output. 2. Severe underlying three-vessel coronary artery disease with patent grafts. 3. Moderate left subclavian stenosis not significant by gradients.   Recommendations: No revascularization is needed before TAVR.    EKG:  EKG is ordered today.  The EKG ordered today demonstrates A-sensed, V-paced rhythm with occasional AV pacing, 77 bpm  Recent Labs: No results found for requested labs within last 365 days.  Recent Lipid Panel    Component Value Date/Time   CHOL 144 01/07/2022 0754   TRIG 120 01/07/2022 0754   HDL 56  01/07/2022 0754   CHOLHDL 2.6 01/07/2022 0754   CHOLHDL 3.0 12/18/2020 1635   VLDL 23 12/18/2020 1635   LDLCALC 67 01/07/2022 0754   LDLDIRECT 83.3 12/18/2020 1635    PHYSICAL EXAM:    VS:  BP 108/63 (BP Location: Left Arm, Patient Position: Sitting, Cuff Size: Normal)   Pulse 77   Ht 5\' 9"  (1.753 m)   Wt 191 lb 9.6 oz (86.9 kg)   SpO2 97%   BMI 28.29 kg/m   BMI: Body mass index is 28.29 kg/m.  Physical Exam Vitals reviewed.  Constitutional:      Appearance: He is well-developed.  HENT:     Head: Normocephalic and atraumatic.  Eyes:     General:        Right eye: No discharge.        Left eye: No discharge.  Neck:     Vascular: No JVD.  Cardiovascular:     Rate and Rhythm: Normal rate and regular rhythm.     Heart sounds: S1 normal and S2 normal. Heart sounds not distant. No midsystolic click and no opening snap. Murmur heard.     Systolic murmur is present with a grade of 2/6 at the upper right sternal border.     No friction rub.  Pulmonary:     Effort: Pulmonary effort is normal. No respiratory distress.     Breath sounds: Normal breath sounds. No decreased breath sounds, wheezing, rhonchi or rales.  Chest:     Chest wall: No tenderness.  Abdominal:     General: There is no distension.  Musculoskeletal:     Cervical back: Normal range of motion.     Right lower leg: No edema.     Left lower leg: No edema.  Skin:    General: Skin is warm and dry.     Nails: There is no clubbing.  Neurological:     Mental Status: He is alert and oriented to person, place, and time.  Psychiatric:        Speech: Speech normal.        Behavior: Behavior normal.        Thought Content: Thought content normal.        Judgment: Judgment normal.  Wt Readings from Last 3 Encounters:  10/11/23 191 lb 9.6 oz (86.9 kg)  07/20/23 195 lb (88.5 kg)  06/21/23 195 lb (88.5 kg)     ASSESSMENT & PLAN:   CAD involving bypass graft with unstable angina: Currently chest pain-free.   Reports a 3-week history of exertional chest and bilateral upper extremity pressure.  Also notes 1 episode of chest pressure that woke him from sleep 3 weeks ago.  Pressure is more intense than what he experienced leading up to his bypass surgeries.  We discussed noninvasive versus invasive ischemic testing, and based on his symptoms and cardiac history, we feel it is appropriate to move directly with cardiac catheterization.  Continue aggressive risk factor modification and secondary prevention including aspirin, clopidogrel, atorvastatin, carvedilol, ezetimibe, and Imdur.  HFrEF secondary to ICM with LBBB status post CRT-P: He appears euvolemic and well compensated.  Relative hypotension has previously led to the de-escalation of GDMT.  Remains on low-dose carvedilol, Entresto, and spironolactone.  Obtain echo as outlined below.  Follow-up with EP as directed.  Severe aortic stenosis status post TAVR: Most recent echo showed normal functioning prosthesis with a mean gradient of 10 mmHg.  Obtain echo.  Remains on aspirin.  SBE prophylaxis is indicated for dental procedures.  HTN: Blood pressure is well-controlled in the office today.  Continue medical therapy as outlined above.  HLD: LDL 69 in 09/2023 with normal AST/ALT at that time.  He remains on atorvastatin and ezetimibe.  PAD: No symptoms of recurrent claudication following bilateral iliac stenting.  Remains on aspirin and statin as outlined above.   Informed Consent   Shared Decision Making/Informed Consent{  The risks [stroke (1 in 1000), death (1 in 1000), kidney failure [usually temporary] (1 in 500), bleeding (1 in 200), allergic reaction [possibly serious] (1 in 200)], benefits (diagnostic support and management of coronary artery disease) and alternatives of a cardiac catheterization were discussed in detail with Mr. Huhn and he is willing to proceed.        Disposition: F/u with Dr. Kirke Corin or an APP 1-2 weeks after  LHC.   Medication Adjustments/Labs and Tests Ordered: Current medicines are reviewed at length with the patient today.  Concerns regarding medicines are outlined above. Medication changes, Labs and Tests ordered today are summarized above and listed in the Patient Instructions accessible in Encounters.   Signed, Eula Listen, PA-C 10/11/2023 12:52 PM     [facility] [place] [address] [place], [place] [address] [phone]

## 2023-10-11 ENCOUNTER — Ambulatory Visit: Payer: Medicare HMO | Attending: Physician Assistant | Admitting: Physician Assistant

## 2023-10-11 ENCOUNTER — Encounter: Payer: Self-pay | Admitting: Physician Assistant

## 2023-10-11 VITALS — BP 108/63 | HR 77 | Ht 69.0 in | Wt 191.6 lb

## 2023-10-11 DIAGNOSIS — I10 Essential (primary) hypertension: Secondary | ICD-10-CM

## 2023-10-11 DIAGNOSIS — I350 Nonrheumatic aortic (valve) stenosis: Secondary | ICD-10-CM | POA: Diagnosis not present

## 2023-10-11 DIAGNOSIS — I25110 Atherosclerotic heart disease of native coronary artery with unstable angina pectoris: Secondary | ICD-10-CM

## 2023-10-11 DIAGNOSIS — I5020 Unspecified systolic (congestive) heart failure: Secondary | ICD-10-CM | POA: Diagnosis not present

## 2023-10-11 DIAGNOSIS — Z951 Presence of aortocoronary bypass graft: Secondary | ICD-10-CM | POA: Diagnosis not present

## 2023-10-11 DIAGNOSIS — E785 Hyperlipidemia, unspecified: Secondary | ICD-10-CM | POA: Diagnosis not present

## 2023-10-11 DIAGNOSIS — I255 Ischemic cardiomyopathy: Secondary | ICD-10-CM

## 2023-10-11 DIAGNOSIS — I200 Unstable angina: Secondary | ICD-10-CM

## 2023-10-11 DIAGNOSIS — I739 Peripheral vascular disease, unspecified: Secondary | ICD-10-CM | POA: Diagnosis not present

## 2023-10-11 DIAGNOSIS — Z952 Presence of prosthetic heart valve: Secondary | ICD-10-CM | POA: Diagnosis not present

## 2023-10-11 NOTE — Patient Instructions (Signed)
Medication Instructions:  Your Physician recommend you continue on your current medication as directed.    *If you need a refill on your cardiac medications before your next appointment, please call your pharmacy*   Lab Work: Your provider would like for you to have following labs drawn today CBC and BMP.   If you have labs (blood work) drawn today and your tests are completely normal, you will receive your results only by: MyChart Message (if you have MyChart) OR A paper copy in the mail If you have any lab test that is abnormal or we need to change your treatment, we will call you to review the results.   Testing/Procedures:  Matthew [name]  10/11/2023  You are scheduled for a Cardiac Catheterization on Friday, November 1 with Dr. [name].  1. Please arrive at the [facility] Entrance of [facility], [address], [place] [address] at 9:00 AM (This is 1 hour(s) prior to your procedure time).  Proceed to the Check-In Desk directly inside the entrance.  Procedure Parking: Use the entrance off of the [address] side of the hospital. Turn right upon entering and follow the driveway to parking that is directly in front of the [facility]. There is no valet parking available at this entrance, however there is an awning directly in front of the [facility] for drop off/ pick up for patients.  Special note: Every effort is made to have your procedure done on time. Please understand that emergencies sometimes delay scheduled procedures.  2. Diet: Do not eat solid foods after midnight.  The patient may have clear liquids until 5am upon the day of the procedure.  3. Labs: You will need to have blood drawn today.  4. Medication instructions in preparation for your procedure:   Stop taking, Advil or Motrin (Ibuprofen) Wednesday, October 30, Entresto  Thursday, October 31, Spironolactone Friday, Nov 1,   On the morning of your procedure, take your  Aspirin 81 mg and Plavix/Clopidogrel.  You may use sips of water.  5. Plan to go home the same day, you will only stay overnight if medically necessary. 6. Bring a current list of your medications and current insurance cards. 7. You MUST have a responsible person to drive you home. 8. Someone MUST be with you the first 24 hours after you arrive home or your discharge will be delayed. 9. Please wear clothes that are easy to get on and off and wear slip-on shoes.  Thank you for allowing [place] to care for you!   -- [facility]    Your physician has requested that you have an echocardiogram. Echocardiography is a painless test that uses sound waves to create images of your heart. It provides your doctor with information about the size and shape of your heart and how well your heart's chambers and valves are working.   You may receive an ultrasound enhancing agent through an IV if needed to better visualize your heart during the echo. This procedure takes approximately one hour.  There are no restrictions for this procedure.  This will take place at [address] [address], [place] [address]     Follow-Up: At [facility], you and your health needs are our priority.  As part of our continuing mission to provide you with exceptional heart care, we have created designated Provider Care Teams.  These Care Teams include your primary Cardiologist (physician)  and Advanced Practice Providers (APPs -  Physician Assistants and Nurse Practitioners) who all work together to provide you with the care you need, when you need it.  We recommend signing up for the patient portal called "MyChart".  Sign up information is provided on this After Visit Summary.  MyChart is used to connect with patients for Virtual Visits (Telemedicine).  Patients are able to view lab/test results, encounter notes, upcoming appointments, etc.  Non-urgent messages can be  sent to your provider as well.   To learn more about what you can do with MyChart, go to [email or web address].    Your next appointment:   1-2 week(s) after cath (around 15 Nov)  Provider:   You may see [name], MD or one of the following Advanced Practice Providers on your designated Care Team:   Eula Listen, [place]

## 2023-10-14 ENCOUNTER — Ambulatory Visit
Admission: RE | Admit: 2023-10-14 | Discharge: 2023-10-14 | Disposition: A | Discharge status: Home / Self Care | Payer: Medicare HMO | Attending: Cardiovascular Disease | Admitting: Cardiovascular Disease

## 2023-10-14 ENCOUNTER — Encounter
Admission: RE | Disposition: A | Discharge status: Home / Self Care | Payer: Self-pay | Source: Home / Self Care | Attending: Cardiovascular Disease

## 2023-10-14 DIAGNOSIS — Z79899 Other long term (current) drug therapy: Secondary | ICD-10-CM | POA: Diagnosis not present

## 2023-10-14 DIAGNOSIS — Z952 Presence of prosthetic heart valve: Secondary | ICD-10-CM | POA: Insufficient documentation

## 2023-10-14 DIAGNOSIS — I739 Peripheral vascular disease, unspecified: Secondary | ICD-10-CM | POA: Insufficient documentation

## 2023-10-14 DIAGNOSIS — Z87891 Personal history of nicotine dependence: Secondary | ICD-10-CM | POA: Insufficient documentation

## 2023-10-14 DIAGNOSIS — I700 Atherosclerosis of aorta: Secondary | ICD-10-CM | POA: Insufficient documentation

## 2023-10-14 DIAGNOSIS — I5022 Chronic systolic (congestive) heart failure: Secondary | ICD-10-CM | POA: Diagnosis not present

## 2023-10-14 DIAGNOSIS — E785 Hyperlipidemia, unspecified: Secondary | ICD-10-CM | POA: Diagnosis not present

## 2023-10-14 DIAGNOSIS — I110 Hypertensive heart disease with heart failure: Secondary | ICD-10-CM | POA: Insufficient documentation

## 2023-10-14 DIAGNOSIS — Z7982 Long term (current) use of aspirin: Secondary | ICD-10-CM | POA: Diagnosis not present

## 2023-10-14 DIAGNOSIS — I2582 Chronic total occlusion of coronary artery: Secondary | ICD-10-CM | POA: Diagnosis not present

## 2023-10-14 DIAGNOSIS — I200 Unstable angina: Secondary | ICD-10-CM

## 2023-10-14 DIAGNOSIS — Z7902 Long term (current) use of antithrombotics/antiplatelets: Secondary | ICD-10-CM | POA: Insufficient documentation

## 2023-10-14 DIAGNOSIS — I25118 Atherosclerotic heart disease of native coronary artery with other forms of angina pectoris: Secondary | ICD-10-CM | POA: Insufficient documentation

## 2023-10-14 DIAGNOSIS — I447 Left bundle-branch block, unspecified: Secondary | ICD-10-CM | POA: Diagnosis not present

## 2023-10-14 DIAGNOSIS — I352 Nonrheumatic aortic (valve) stenosis with insufficiency: Secondary | ICD-10-CM | POA: Diagnosis not present

## 2023-10-14 DIAGNOSIS — I25110 Atherosclerotic heart disease of native coronary artery with unstable angina pectoris: Secondary | ICD-10-CM

## 2023-10-14 DIAGNOSIS — I255 Ischemic cardiomyopathy: Secondary | ICD-10-CM | POA: Diagnosis not present

## 2023-10-14 DIAGNOSIS — Z951 Presence of aortocoronary bypass graft: Secondary | ICD-10-CM | POA: Diagnosis not present

## 2023-10-14 HISTORY — PX: CORONARY/GRAFT ANGIOGRAPHY: CATH118237

## 2023-10-14 SURGERY — CORONARY/GRAFT ANGIOGRAPHY
Anesthesia: Moderate Sedation | Laterality: Left

## 2023-10-14 MED ORDER — ASPIRIN 81 MG PO CHEW
81.0000 mg | CHEWABLE_TABLET | ORAL | Status: AC
  Administered 2023-10-14: 81 mg via ORAL

## 2023-10-14 MED ORDER — MIDAZOLAM HCL 2 MG/2ML IJ SOLN
INTRAMUSCULAR | Status: DC | PRN
  Administered 2023-10-14: 1 mg via INTRAVENOUS

## 2023-10-14 MED ORDER — ONDANSETRON HCL 4 MG/2ML IJ SOLN: 4.0000 mg | Freq: Four times a day (QID) | INTRAMUSCULAR | Status: DC | PRN

## 2023-10-14 MED ORDER — SODIUM CHLORIDE 0.9 % IV SOLN: 250.0000 mL | INTRAVENOUS | Status: DC | PRN

## 2023-10-14 MED ORDER — IOHEXOL 300 MG/ML  SOLN
INTRAMUSCULAR | Status: DC | PRN
  Administered 2023-10-14: 85 mL

## 2023-10-14 MED ORDER — MIDAZOLAM HCL 2 MG/2ML IJ SOLN
INTRAMUSCULAR | Status: AC
  Filled 2023-10-14: qty 2

## 2023-10-14 MED ORDER — SODIUM CHLORIDE 0.9% FLUSH: 3.0000 mL | Freq: Two times a day (BID) | INTRAVENOUS | Status: DC

## 2023-10-14 MED ORDER — SODIUM CHLORIDE 0.9% FLUSH: 3.0000 mL | INTRAVENOUS | Status: DC | PRN

## 2023-10-14 MED ORDER — SODIUM CHLORIDE 0.9 % IV SOLN: INTRAVENOUS | Status: DC

## 2023-10-14 MED ORDER — ACETAMINOPHEN 325 MG PO TABS
650.0000 mg | ORAL_TABLET | ORAL | Status: DC | PRN
Start: 2023-10-14 — End: 2023-10-14

## 2023-10-14 MED ORDER — FENTANYL CITRATE (PF) 100 MCG/2ML IJ SOLN
INTRAMUSCULAR | Status: AC
  Filled 2023-10-14: qty 2

## 2023-10-14 MED ORDER — LIDOCAINE HCL 1 % IJ SOLN
INTRAMUSCULAR | Status: AC
  Filled 2023-10-14: qty 20

## 2023-10-14 MED ORDER — LIDOCAINE HCL (PF) 1 % IJ SOLN
INTRAMUSCULAR | Status: DC | PRN
  Administered 2023-10-14: 20 mL

## 2023-10-14 MED ORDER — HEPARIN (PORCINE) IN NACL 1000-0.9 UT/500ML-% IV SOLN
INTRAVENOUS | Status: AC
  Filled 2023-10-14: qty 1000

## 2023-10-14 MED ORDER — HEPARIN (PORCINE) IN NACL 1000-0.9 UT/500ML-% IV SOLN
INTRAVENOUS | Status: DC | PRN
  Administered 2023-10-14 (×2): 500 mL

## 2023-10-14 MED ORDER — ASPIRIN 81 MG PO CHEW
CHEWABLE_TABLET | ORAL | Status: AC
  Filled 2023-10-14: qty 1

## 2023-10-14 SURGICAL SUPPLY — 12 items
CATH INFINITI 5 FR IM (CATHETERS) IMPLANT
CATH INFINITI 5FR MULTPACK ANG (CATHETERS) IMPLANT
DEVICE CLOSURE MYNXGRIP 5F (Vascular Products) IMPLANT
KIT MICROPUNCTURE NIT STIFF (SHEATH) IMPLANT
PACK CARDIAC CATH (CUSTOM PROCEDURE TRAY) ×1 IMPLANT
PROTECTION STATION PRESSURIZED (MISCELLANEOUS) ×1
SET ATX-X65L (MISCELLANEOUS) IMPLANT
SHEATH AVANTI 5FR X 11CM (SHEATH) IMPLANT
STATION PROTECTION PRESSURIZED (MISCELLANEOUS) IMPLANT
WIRE EMERALD 3MM-J .035X260CM (WIRE) IMPLANT
WIRE GUIDERIGHT .035X150 (WIRE) IMPLANT
WIRE HITORQ VERSACORE ST 145CM (WIRE) IMPLANT

## 2023-10-14 NOTE — Interval H&P Note (Signed)
History and Physical Interval Note:  10/14/2023 10:16 AM  Matthew [name]  has presented today for surgery, with the diagnosis of L Cath   Unstable angina.  The various methods of treatment have been discussed with the patient and family. After consideration of risks, benefits and other options for treatment, the patient has consented to  Procedure(s): LEFT HEART CATH AND CORONARY ANGIOGRAPHY (Left) as a surgical intervention.  The patient's history has been reviewed, patient examined, no change in status, stable for surgery.  I have reviewed the patient's chart and labs.  Questions were answered to the patient's satisfaction.     [name]

## 2023-10-14 NOTE — Progress Notes (Signed)
Dr. Kirke Corin in at bedside, speaking with pt. And his wife & daughter re: cath results. All verbalized understanding of conversation.

## 2023-10-17 ENCOUNTER — Encounter: Payer: Self-pay | Admitting: Cardiovascular Disease

## 2023-10-18 ENCOUNTER — Other Ambulatory Visit: Payer: Self-pay | Admitting: Cardiovascular Disease

## 2023-10-19 MED ORDER — ENTRESTO 24-26 MG PO TABS: 1.0000 | ORAL_TABLET | Freq: Two times a day (BID) | ORAL | 3 refills | Status: DC

## 2023-10-19 NOTE — Telephone Encounter (Signed)
Refill sent to the requested pharmacy.  

## 2023-10-24 ENCOUNTER — Encounter: Payer: Self-pay | Admitting: Oncology

## 2023-10-25 ENCOUNTER — Ambulatory Visit (INDEPENDENT_AMBULATORY_CARE_PROVIDER_SITE_OTHER): Payer: Medicare HMO

## 2023-10-25 DIAGNOSIS — I5020 Unspecified systolic (congestive) heart failure: Secondary | ICD-10-CM

## 2023-10-25 DIAGNOSIS — I255 Ischemic cardiomyopathy: Secondary | ICD-10-CM

## 2023-10-26 ENCOUNTER — Ambulatory Visit: Payer: Medicare HMO | Attending: Physician Assistant

## 2023-10-27 LAB — CUP PACEART REMOTE DEVICE CHECK
Battery Remaining Longevity: 25 mo
Battery Remaining Percentage: 35 %
Battery Voltage: 2.96 V
Brady Statistic AP VP Percent: 79 %
Brady Statistic AP VS Percent: 1 %
Brady Statistic AS VP Percent: 17 %
Brady Statistic AS VS Percent: 1 %
Brady Statistic RA Percent Paced: 76 %
Date Time Interrogation Session: 20241112020011
Implantable Lead Connection Status: 753985
Implantable Lead Connection Status: 753985
Implantable Lead Connection Status: 753985
Implantable Lead Implant Date: 20200810
Implantable Lead Implant Date: 20200810
Implantable Lead Implant Date: 20200810
Implantable Lead Location: 753858
Implantable Lead Location: 753859
Implantable Lead Location: 753860
Implantable Lead Model: 5076
Implantable Lead Model: 5076
Implantable Pulse Generator Implant Date: 20200810
Lead Channel Impedance Value: 390 Ohm
Lead Channel Impedance Value: 400 Ohm
Lead Channel Impedance Value: 540 Ohm
Lead Channel Pacing Threshold Amplitude: 0.75 V
Lead Channel Pacing Threshold Amplitude: 1 V
Lead Channel Pacing Threshold Amplitude: 1 V
Lead Channel Pacing Threshold Pulse Width: 0.4 ms
Lead Channel Pacing Threshold Pulse Width: 0.4 ms
Lead Channel Pacing Threshold Pulse Width: 1 ms
Lead Channel Sensing Intrinsic Amplitude: 12 mV
Lead Channel Sensing Intrinsic Amplitude: 2 mV
Lead Channel Setting Pacing Amplitude: 2 V
Lead Channel Setting Pacing Amplitude: 2 V
Lead Channel Setting Pacing Amplitude: 2 V
Lead Channel Setting Pacing Pulse Width: 0.4 ms
Lead Channel Setting Pacing Pulse Width: 1 ms
Lead Channel Setting Sensing Sensitivity: 2 mV
Pulse Gen Model: 3562
Pulse Gen Serial Number: 9130392

## 2023-10-31 ENCOUNTER — Ambulatory Visit: Payer: Medicare HMO | Attending: Physician Assistant | Admitting: Physician Assistant

## 2023-10-31 NOTE — Progress Notes (Deleted)
Cardiology Office Note    Date:  10/31/2023   ID:  JERRAD PIONTEK, DOB 11/24/1936, MRN [id number]  PCP:  [name], MD  Cardiologist:  [name], MD  Electrophysiologist:  [name], MD   Chief Complaint: Follow-up  History of Present Illness:   Matthew [name] is a 87 y.o. male with history of CAD status post CABG in 1991 with redo CABG in 2004, aortic stenosis status post TAVR in 05/2016 with postprocedure LBBB, HFrEF secondary to ICM status post CRT-P in 07/2019, PAD status post prior intervention, HTN, HLD, GI bleeding in 09/2018 with gastric AVMs noted status post therapy with acute blood loss anemia, and PVCs who presents for follow-up of LHC.   R/LHC in 05/2016, prior to his TAVR, showed severe underlying 3-vessel CAD with patent grafts (proximal LAD 100% stenosed, mid LCx 100% stenosed, OM2 80% stenosed, proximal RCA 100% stenosed, LIMA-LAD was unable to be cannulated and was anatomically normal and normal in caliber, RIMA-D2 was visualized by non-selective angiography and was anatomically normal, VG-OM2 with a patent, previously placed proximal graft stent, 50% mid VG-OM2 stenosis, VG-OM3 minimal luminal irregularities, VG-RPDA mild diffuse disease), RHC showed high normal pulmonary pressure and filling pressure, normal cardiac output. Following this, he underwent TAVR in 05/2016. Post procedure, he developed a LBBB coupled with a gradual decline in his EF. Pre-TAVR, his EF was 55-60% with follow up study shortly after his procedure showing an EF of 55%. In 2018, his EF was noted to be 45-50% then dropped to 30-35% in 2019. Cardiac cath in 2019, performed for worsening angina, showed significant underlying three-vessel CAD with patent LIMA to LAD, patent RIMA to diagonal, patent SVG to OM3 and patent SVG to rPDA. The SVG to OM2 was found to be occluded. However, OM2 had retrograde flow from SVG to OM3. There was possible left subclavian artery stenosis but it was not  significant by gradient. This was also evaluated by carotid Doppler and there was nothing to suggest significant left subclavian stenosis.  He was admitted to the hospital in 09/2018 with GI bleed and acute blood loss anemia.  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  Anemia improved with IV iron.  Echo in 02/2019, showed an EF of 25-30%, mild aortic stenosis with a mean gradient of 17 mmHg and mild to moderate aortic insufficiency. With continued decline in his LVSF, with an underlying LBBB, he underwent CRT-P implantation in 07/2019.  Echo 08/2020 showed an improved LVSF with an EF of 40-45%, mildly dilated LV cavity size, moderate LVH, Gr1DD, elevated left atrial pressure, moderately reduced RVSF with normal RV cavity size, trivial mitral regurgitation, normal size and function of AVR.  Due to worsening angina, he underwent diagnostic cath in 08/2020 that showed significant underlying 3 vessel CAD with patent grafts including LIMA to LAD, RIMA to diagonal, SVG RCA, and SVG to OM3.  There was no significant change in his coronary anatomy when compared to prior cardiac cath.  He was noted to have native small vessel CAD which was felt to possibly be the culprit for his stable anginal symptoms.  RHC showed normal filling pressures, normal pulmonary pressure, and normal cardiac output.  Most recent echo from 12/2021 demonstrated an EF of 40 to 45% with normal functioning TAVR prosthesis and a mean gradient of 10 mmHg.  Progressive hypotension has previously necessitated the de-escalation of GDMT.    He was seen in the office on 10/11/2019 for noting a 3-week  history of exertional chest and bilateral upper extremity pressure as well as an increase in exertional fatigue and dyspnea.  In this setting, he underwent LHC on 10/14/2023 that showed severe underlying three-vessel CAD with no significant change in coronary anatomy since 2021.  Patent LIMA to LAD and RIMA to diagonal.  Patent SVG to RPDA and SVG to OM  3 with known chronically occluded jump graft to OM 2 and chronically occluded SVG to diagonal.  The prosthetic aortic valve was not crossed.  Medical therapy was recommended with symptoms felt to be likely due to small vessel disease.  ***   Labs independently reviewed: 09/2023 - Hgb 14.5, PLT 116, potassium 4.1, BUN 18, serum creatinine 1.0, albumin 4.5, AST/ALT normal, TC 144, TG 106, HDL 54, LDL 69, TSH normal   Past Medical History:  Diagnosis Date   Alcohol abuse    stopped drinking 42 years ago from today 2020   Anemia    AS (aortic stenosis)    a. Echo 6/10: EF 55% mild AS; b. echo 06/2015; EF 55-60%, GR1DD, moderate AS, Peak velocity (S): 346 cm/s. Mean gradientS): 28 mm Hg. Peak gradient (S): 48 mm Hg. Valve area (VTI): 1.18 cm2;   c. Echo 4/17 - mild LVH, EF 55-60%, no RWMA, Gr 1 DD, mod to severe AS (mean 28 mmHg, peak 46 mmHg), mild LAE   CAD (coronary artery disease)    a. MI 1996 w/ CABG 1996; b.redo CABG 06/2003; c. Myoview 7/09: EF 54% inferobasal infarct, no ischemia. Myoview 6/10 EF 43% inf wall infarct. no ischemia; c. cath 8/16 s/p DES to VG-OM2. OTW 3VD w/ patent VG->RPDA, VG->OM3,  & LIMA->LAD.  EF 55-65%.   Carotid bruit    2009 0-39% on dopplers bilatrally   Chest pain    GERD (gastroesophageal reflux disease)    History of kidney stones    HOH (hard of hearing)    HTN (hypertension)    Hyperlipidemia    Myocardial infarction (HCC) 1996   Nephrolithiasis    OSA (obstructive sleep apnea)    PAD (peripheral artery disease) (HCC) 02/2014   Subtotal occlusion of right common iliac artery and 70% stenosis in the left common iliac artery. Status post bilateral kissing stent placement. Significant post stenosis aneurysmal dilatation on the right side (any future catheterization through the right femoral artery should be done cautiously to avoid advancing the wire behind the stent struts)   Shortness of breath    with exertion   Squamous cell carcinoma of skin 12/17/2020    R angle of mandible - SCCIS, ED&C 01/22/2021   Squamous cell carcinoma of skin 12/17/2020   R upper arm SCCIS arising in [place], ED&C 01/22/2021   Squamous cell carcinoma of skin 12/17/2020   R chest - SCCIS, ED&C 01/22/2021   Thrombocytopenia (HCC)     Past Surgical History:  Procedure Laterality Date   ABDOMINAL AORTAGRAM N/A 03/06/2014   Procedure: ABDOMINAL Ronny Flurry;  Surgeon: Iran Ouch, MD;  Location: [facility] CATH LAB;  Service: Cardiovascular;  Laterality: N/A;   BIV PACEMAKER INSERTION CRT-P N/A 07/23/2019   Procedure: BIV PACEMAKER INSERTION CRT-P;  Surgeon: Duke Salvia, MD;  Location: [facility] INVASIVE CV LAB;  Service: Cardiovascular;  Laterality: N/A;   CARDIAC CATHETERIZATION  02/2014   Severe three-vessel coronary artery disease with patent grafts.    CARDIAC CATHETERIZATION N/A 07/31/2015   Procedure: Right/Left Heart Cath and Coronary/Graft Angiography;  Surgeon: Iran Ouch, MD;  Location: [facility] INVASIVE CV LAB;  Service: Cardiovascular;  Laterality: N/A;   CARDIAC CATHETERIZATION N/A 07/31/2015   Procedure: Coronary Stent Intervention;  Surgeon: Iran Ouch, MD;  Location: [facility] INVASIVE CV LAB;  Service: Cardiovascular;  Laterality: N/A;   CARDIAC CATHETERIZATION N/A 05/26/2016   Procedure: Right/Left Heart Cath and Coronary Angiography;  Surgeon: Iran Ouch, MD;  Location: [facility] INVASIVE CV LAB;  Service: Cardiovascular;  Laterality: N/A;   COLONOSCOPY WITH PROPOFOL N/A 10/07/2018   Procedure: COLONOSCOPY WITH PROPOFOL;  Surgeon: Toney Reil, MD;  Location: [facility] ENDOSCOPY;  Service: Gastroenterology;  Laterality: N/A;   COLONOSCOPY WITH PROPOFOL N/A 01/08/2019   Procedure: COLONOSCOPY WITH PROPOFOL;  Surgeon: Toney Reil, MD;  Location: [facility] ENDOSCOPY;  Service: Gastroenterology;  Laterality: N/A;   CORONARY ARTERY BYPASS GRAFT  1991   at [facility]. Redone 2004-3 vessels 1st time and 4 second time   CORONARY/GRAFT ANGIOGRAPHY Left 10/14/2023   Procedure:  CORONARY/GRAFT ANGIOGRAPHY;  Surgeon: Iran Ouch, MD;  Location: [facility] INVASIVE CV LAB;  Service: Cardiovascular;  Laterality: Left;   CYSTO     2/3 times for kidney stones   ENTEROSCOPY N/A 12/25/2018   Procedure: ENTEROSCOPY Single balloon;  Surgeon: Toney Reil, MD;  Location: [facility] ENDOSCOPY;  Service: Gastroenterology;  Laterality: N/A;   ESOPHAGOGASTRODUODENOSCOPY (EGD) WITH PROPOFOL N/A 10/07/2018   Procedure: ESOPHAGOGASTRODUODENOSCOPY (EGD) WITH PROPOFOL;  Surgeon: Toney Reil, MD;  Location: [facility] ENDOSCOPY;  Service: Gastroenterology;  Laterality: N/A;   GIVENS CAPSULE STUDY N/A 12/22/2018   Procedure: GIVENS CAPSULE STUDY;  Surgeon: Toney Reil, MD;  Location: [facility] ENDOSCOPY;  Service: Gastroenterology;  Laterality: N/A;   INGUINAL HERNIA REPAIR  11/10/2011   Procedure: HERNIA REPAIR INGUINAL ADULT;  Surgeon: Fabio Bering;  Location: [facility] ORS;  Service: General;  Laterality: Right;   LAPAROSCOPIC RIGHT COLECTOMY Right 02/09/2019   Procedure: LAPAROSCOPIC RIGHT COLECTOMY;  Surgeon: Leafy Ro, MD;  Location: [facility] ORS;  Service: General;  Laterality: Right;   LEFT HEART CATH AND CORONARY ANGIOGRAPHY Left 05/15/2018   Procedure: LEFT HEART CATH AND CORONARY ANGIOGRAPHY;  Surgeon: Iran Ouch, MD;  Location: [facility] INVASIVE CV LAB;  Service: Cardiovascular;  Laterality: Left;   LEFT HEART CATHETERIZATION WITH CORONARY/GRAFT ANGIOGRAM N/A 03/06/2014   Procedure: LEFT HEART CATHETERIZATION WITH Isabel Caprice;  Surgeon: Iran Ouch, MD;  Location: [facility] CATH LAB;  Service: Cardiovascular;  Laterality: N/A;   RIGHT HEART CATH AND CORONARY/GRAFT ANGIOGRAPHY N/A 09/05/2020   Procedure: RIGHT HEART CATH AND CORONARY/GRAFT ANGIOGRAPHY;  Surgeon: Iran Ouch, MD;  Location: [facility] INVASIVE CV LAB;  Service: Cardiovascular;  Laterality: N/A;   STOMACH SURGERY     removal of gastric ulcers   TEE WITHOUT CARDIOVERSION N/A 06/01/2016   Procedure:  TRANSESOPHAGEAL ECHOCARDIOGRAM (TEE);  Surgeon: Tonny Bollman, MD;  Location: [facility] OR;  Service: Open Heart Surgery;  Laterality: N/A;   TRANSCATHETER AORTIC VALVE REPLACEMENT, TRANSFEMORAL N/A 06/01/2016   Procedure: TRANSCATHETER AORTIC VALVE REPLACEMENT, TRANSFEMORAL;  Surgeon: Tonny Bollman, MD;  Location: [facility] OR;  Service: Open Heart Surgery;  Laterality: N/A;    Current Medications: No outpatient medications have been marked as taking for the 10/31/23 encounter (Appointment) with Sondra Barges, PA-C.    Allergies:   Ranitidine and Ranexa [ranolazine er]   Social History   Socioeconomic History   Marital status: Married    Spouse name: Not on file   Number of children: 2   Years of education: Not on file   Highest education level: Not on file  Occupational  History   Occupation: Retired  Tobacco Use   Smoking status: Former    Current packs/day: 0.00    Average packs/day: 1 pack/day for 50.0 years (50.0 ttl pk-yrs)    Types: Cigarettes    Start date: 01/25/1940    Quit date: 01/24/1990    Years since quitting: 33.7   Smokeless tobacco: Never  Vaping Use   Vaping status: Never Used  Substance and Sexual Activity   Alcohol use: No    Alcohol/week: 0.0 standard drinks of alcohol    Comment: Alcoholic in past   Drug use: No   Sexual activity: Never  Other Topics Concern   Not on file  Social History Narrative   Married an lives with wife in [place]. Retired form [employer].    Social Determinants of Health   Financial Resource Strain: Not on file  Food Insecurity: Not on file  Transportation Needs: Not on file  Physical Activity: Not on file  Stress: Not on file  Social Connections: Not on file     Family History:  The patient's family history includes Heart disease in his father and mother. There is no history of Colon cancer.  ROS:   12-point review of systems is negative unless otherwise noted in the HPI.   EKGs/Labs/Other Studies Reviewed:     Studies reviewed were summarized above. The additional studies were reviewed today:  LHC 10/14/2023:   Prox LAD lesion is 100% stenosed.   Ost Cx lesion is 30% stenosed.   Ost Cx to Prox Cx lesion is 100% stenosed.   Mid Cx lesion is 60% stenosed.   Prox RCA lesion is 100% stenosed.   Origin to Prox Graft lesion is 100% stenosed.   Origin lesion before Acute Mrg  is 20% stenosed.   2nd Mrg lesion is 80% stenosed.   Ost RPDA lesion is 70% stenosed.   Origin to Prox Graft lesion is 100% stenosed.   Mid RCA to Dist RCA lesion is 100% stenosed.   and is normal in caliber.   and is normal in caliber.   LIMA due to inability to cannulate and is normal in caliber.   SVG.   The graft exhibits minimal luminal irregularities.   The graft exhibits severe focal disease.   The graft exhibits no disease.   1.  Severe underlying three-vessel coronary artery disease with no significant change in coronary anatomy since 2021.  Patent LIMA to LAD and RIMA to diagonal.  Patent SVG to right PDA and SVG to OM 3 with known chronically occluded jump graft to OM 2 and chronically occluded SVG to diagonal. Difficult procedure overall due to heavy aortic arch calcifications and significant innominate and subclavian artery tortuosity. 2.  The prosthetic aortic valve was not crossed.   Recommendations: Continue aggressive medical therapy.  Angina is likely due to small vessel disease. __________  2D echo 01/07/2022: 1. Left ventricular ejection fraction, by estimation, is 40 to 45%. The  left ventricle has mild to moderately decreased function. The left  ventricle demonstrates global hypokinesis. Left ventricular diastolic  parameters are consistent with Grade I  diastolic dysfunction (impaired relaxation).   2. Right ventricular systolic function is normal. The right ventricular  size is normal.   3. Left atrial size was mildly dilated.   4. Right atrial size was mildly dilated.   5. The mitral valve  is degenerative. No evidence of mitral valve  regurgitation.   6. The aortic valve has been repaired/replaced. Aortic valve  regurgitation is not visualized. There is a 26 mm Sapien prosthetic (TAVR)  valve present in the aortic position. Procedure Date: 2017. Echo findings  are consistent with normal structure and  function of the aortic valve prosthesis. Aortic valve mean gradient  measures 10.0 mmHg. __________   [facility] 09/05/2020: Prox LAD lesion is 100% stenosed. 2nd Mrg lesion is 80% stenosed. Ost Cx lesion is 30% stenosed. Ost Cx to Prox Cx lesion is 100% stenosed. Mid Cx lesion is 60% stenosed. Prox RCA lesion is 100% stenosed. And is normal in caliber. The graft exhibits minimal luminal irregularities. And is normal in caliber. The graft exhibits severe focal disease. Origin to Prox Graft lesion is 100% stenosed. LIMA due to inability to cannulate and is normal in caliber. The graft exhibits no disease. SVG. Origin lesion before Acute Mrg is 20% stenosed. Ost RPDA lesion is 70% stenosed.   1.  Significant underlying three-vessel coronary artery disease with patent grafts including LIMA to LAD, RIMA to diagonal, SVG to RCA and SVG to OM 3.  No significant change in coronary anatomy since most recent cardiac catheterization.  The patient does have native small vessel coronary artery disease which might be the culprit for his stable anginal symptoms. 2.  Right heart catheterization showed normal filling pressures, normal pulmonary pressure and normal cardiac output.   Recommendations: Continue medical therapy. __________   2D echo 09/02/2020: 1. Left ventricular ejection fraction, by estimation, is 40 to 45%. The  left ventricle has mildly decreased function. Left ventricular endocardial  border not optimally defined to evaluate regional wall motion. The left  ventricular internal cavity size  was mildly dilated. There is moderate left ventricular hypertrophy. Left   ventricular diastolic parameters are consistent with Grade I diastolic  dysfunction (impaired relaxation). Elevated left atrial pressure.   2. Right ventricular systolic function is moderately reduced. The right  ventricular size is normal. Tricuspid regurgitation signal is inadequate  for assessing PA pressure.   3. The mitral valve is normal in structure. Trivial mitral valve  regurgitation. No evidence of mitral stenosis.   4. The aortic valve has been repaired/replaced. Aortic valve  regurgitation is not visualized. Echo findings are consistent with normal  structure and function of the aortic valve prosthesis. Aortic valve mean  gradient measures 14.0 mmHg.  __________   2D echo 02/2019:  1. The left ventricle has severely reduced systolic function, with an  ejection fraction of 25-30%. The cavity size was moderately dilated. Left  ventricular diffuse hypokinesis. Unable to exclude regional wall motion  abnormality (challenging image  quality)   2. The right ventricle has normal systolic function. The cavity was  normal. There is no increase in right ventricular wall thickness. Right  ventricular systolic pressure is mildly elevated with an estimated  pressure of 38.0 mmHg.   3. Left atrial size was severely dilated.   4. Aortic valve regurgitation is mild to moderate. Mild stenosis of the  aortic valve. Mean gradient 17 mm Hg.  __________   2D echo 09/2018: - Left ventricle: The cavity size was mildly dilated. Wall    thickness was normal. Systolic function was moderately to    severely reduced. The estimated ejection fraction was in the    range of 30% to 35%. Wall motion was normal; there were no    regional wall motion abnormalities. Features are consistent with    a pseudonormal left ventricular filling pattern, with concomitant    abnormal relaxation and increased filling pressure (grade  2    diastolic dysfunction).  - Aortic valve: A bioprosthesis was present and  functioning    normally. s/p TAVR. Mean gradient (S): 13 mm Hg.  - Mitral valve: Calcified annulus. There was mild regurgitation.  - Left atrium: The atrium was moderately dilated.  - Pulmonary arteries: Systolic pressure could not be accurately    estimated.  __________   LHC 05/2018: Prox LAD lesion is 100% stenosed. 2nd Mrg lesion is 80% stenosed. Prox RCA lesion is 100% stenosed. And is normal in caliber. The graft exhibits minimal luminal irregularities. And is normal in caliber. The graft exhibits severe focal disease. LIMA due to inability to cannulate and is normal in caliber. The graft exhibits no disease. Origin lesion is 40% stenosed. Ost Cx to Prox Cx lesion is 100% stenosed. Ost Cx lesion is 30% stenosed. Mid Cx lesion is 60% stenosed. Origin to Prox Graft lesion is 100% stenosed.   1.  Significant underlying three-vessel coronary artery disease with patent LIMA to LAD, patent RIMA to diagonal, patent SVG to OM 3 and patent SVG to right PDA.  The SVG to OM 2 is now occluded which is a new finding.  However, OM2 gets retrograde flow from SVG to OM 3. 2.  Possible significant left subclavian artery stenosis although no gradient was noted with pullback.   Recommendations: Overall difficult procedure due to significant tortuosity of the innominate artery and left subclavian artery.  No coronary revascularization is needed.  We have to investigate the significance of left subclavian artery stenosis which might be causing decreased flow into the LIMA to LAD.  Recommend carotid Doppler with focus on the left subclavian artery.  Endovascular intervention of the left subclavian artery is not straightforward due to tortuosity and also close origin to the LIMA. The patient is noted to have frequent PVCs before and throughout the cath.  This might be contributing to his cardiomyopathy.  Recommend a 24-hour Holter monitor. __________   24-hour Holter 05/2018: 5600 PVCs in 24 hours  representing 6% burden. Occasional PACs. Average heart rate of 62 bpm. __________   2D echo 04/2018: - Left ventricle: The cavity size was normal. Systolic function was    moderately to severely reduced. The estimated ejection fraction    was in the range of 30% to 35%. Diffuse hypokinesis. Regional    wall motion abnormalities cannot be excluded. Features are    consistent with a pseudonormal left ventricular filling pattern,    with concomitant abnormal relaxation and increased filling    pressure (grade 2 diastolic dysfunction).  - Aortic valve: A bioprosthesis was present. s/p TAVR Peak velocity    (S): 250 cm/s. Mean gradient (S): 14 mm Hg.  - Aortic root: The aortic root was mildly dilated.  - Mitral valve: There was mild regurgitation.  - Left atrium: The atrium was moderately dilated.  - Right ventricle: Systolic function was normal.  - Pulmonary arteries: Systolic pressure was within the normal    range.  __________   2D echo 05/2017: - Left ventricle: The cavity size was mildly dilated. There was    mild concentric hypertrophy. Systolic function was mildly    reduced. The estimated ejection fraction was in the range of 45%    to 50%. Doppler parameters are consistent with abnormal left    ventricular relaxation (grade 1 diastolic dysfunction).  - Ventricular septum: Septal motion showed abnormal function and    dyssynergy.  - Aortic valve: A TAVR prosthesis was present and functioning  normally. Mean gradient (S): 11 mm Hg. Peak gradient (S): 20 mm    Hg. Valve area (VTI): 1.79 cm^2.  - Left atrium: The atrium was moderately to severely dilated.  - Right atrium: The atrium was mildly dilated.   Impressions:   - Comapred to most recent echo, the LV is mildly more dilated with    mildly reduced EF.  __________   2D echo 06/2016: - Left ventricle: LVEF is approximately 55% with mid/distal    inferior hypokiensis; septal hypokdinesis; basal inferior    akinesis with  aneurysmal dilitation. The cavity size was mildly    dilated. Wall thickness was normal. Doppler parameters are    consistent with abnormal left ventricular relaxation (grade 1    diastolic dysfunction).  - Aortic valve: AV prosthesis is well seated Peak and mean    gradients through the valve are 19 and 11 mm Hg respectively.  - Mitral valve: Calcified annulus. Mildly thickened leaflets .  - Left atrium: The atrium was severely dilated.  __________   2D echo 05/2016: - Left ventricle: Posterior basal akinesis The cavity size was    mildly dilated. Systolic function was normal. The estimated    ejection fraction was in the range of 50% to 55%. Doppler    parameters are consistent with both elevated ventricular    end-diastolic filling pressure and elevated left atrial filling    pressure.  - Aortic valve: Normal appearing 26 mm Sapien 3 valve with no    significant perivalvular regurgitation post TAVR. Valve area    (VTI): 0.81 cm^2. Valve area (Vmax): 0.83 cm^2. Valve area    (Vmean): 0.85 cm^2.  - Left atrium: The atrium was moderately dilated.  - Atrial septum: No defect or patent foramen ovale was identified. __________   TEE 05/2016: - Aortic valve: Valve area (VTI): 1.94 cm^2. Valve area (Vmax):    1.99 cm^2. Valve area (Vmean): 2.06 cm^2.  - Impressions: Pre-TAVR: EF 50-55% posterior basal akinesis. No    effusion. Mild MR. Normal RV with mild TR. No ASD/PFO. Aortic    valve trileaflet    and severely calcified. Peak velocity 4.3 m/sec, peak gradient 73    mmHg mean gradient 45 mmHg. Mild to moderate AR.       Post TAVR: Slightly high plaecement of 26 mm Sapien 3 valve.    Trivial perivalvular regurgitation at 2:00 on SA views. Peak    gradient 4 mmHg    mean gradient 2 mmHg peak velocity .98 m/sec AVA 2.36 cm2. EF    remains 50-55% with posteior basal akinesis No new RWMA;s Mild MR    RV normal with no increase in PA pressures No new effusion aorti    root intact.    Impressions:   - Pre-TAVR: EF 50-55% posterior basal akinesis. No effusion. Mild    MR. Normal RV with mild TR. No ASD/PFO. Aortic valve trileaflet    and severely calcified. Peak velocity 4.3 m/sec, peak gradient 73    mmHg mean gradient 45 mmHg. Mild to moderate AR. __________   [facility] 05/2016: Prox RCA lesion, 100% stenosed. SVG was injected is normal in caliber. There is mild diffuse disease in the graft. Mid Cx lesion, 100% stenosed. SVG was injected is normal in caliber. The graft exhibits minimal luminal irregularities. was injected is normal in caliber. There is severe focal disease in the graft. 2nd Mrg lesion, 80% stenosed. LIMA due to inability to cannulate and is normal in caliber, and is anatomically  normal. Prox LAD lesion, 100% stenosed. Mid Graft lesion, 50% stenosed. RIMA was visualized by non-selective angiography is and is anatomically normal.   1. Right heart catheterization showed high normal pulmonary pressure and filling pressures. Normal cardiac output. 2. Severe underlying three-vessel coronary artery disease with patent grafts. 3. Moderate left subclavian stenosis not significant by gradients.   Recommendations: No revascularization is needed before TAVR.   EKG:  EKG is ordered today.  The EKG ordered today demonstrates ***  Recent Labs: No results found for requested labs within last 365 days.  Recent Lipid Panel    Component Value Date/Time   CHOL 144 01/07/2022 0754   TRIG 120 01/07/2022 0754   HDL 56 01/07/2022 0754   CHOLHDL 2.6 01/07/2022 0754   CHOLHDL 3.0 12/18/2020 1635   VLDL 23 12/18/2020 1635   LDLCALC 67 01/07/2022 0754   LDLDIRECT 83.3 12/18/2020 1635    PHYSICAL EXAM:    VS:  There were no vitals taken for this visit.  BMI: There is no height or weight on file to calculate BMI.  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  PAD:   {Are you ordering a CV Procedure (e.g. stress test, cath, DCCV, TEE, etc)?   Press F2        :[id number]}     Disposition: F/u with Dr. Kirke Corin or an APP in ***, and EP as directed.    Medication Adjustments/Labs and Tests Ordered: Current medicines are reviewed at length with the patient today.  Concerns regarding medicines are outlined above. Medication changes, Labs and Tests ordered today are summarized above and listed in the Patient Instructions accessible in Encounters.   Signed, Eula Listen, PA-C 10/31/2023 6:53 AM     [facility] [place] [address] [place], [place] [address] [phone]

## 2023-11-01 ENCOUNTER — Encounter: Payer: Self-pay | Admitting: Physician Assistant

## 2023-11-13 NOTE — Progress Notes (Signed)
Cardiology Office Note    Date:  11/18/2023   ID:  Matthew [name], DOB September 29, 1936, MRN [id number]  PCP:  [name], MD  Cardiologist:  [name], MD  Electrophysiologist:  [name], MD   Chief Complaint: Follow-up  History of Present Illness:   Matthew [name] is a 87 y.o. male with history of CAD status post CABG in 1991 with redo CABG in 2004, aortic stenosis status post TAVR in 05/2016 with postprocedure LBBB, HFrEF secondary to ICM status post CRT-P in 07/2019, PAD status post prior intervention, HTN, HLD, GI bleeding in 09/2018 with gastric AVMs noted status post therapy with acute blood loss anemia, and PVCs who presents for follow-up of LHC.   R/LHC in 05/2016, prior to his TAVR, showed severe underlying 3-vessel CAD with patent grafts (proximal LAD 100% stenosed, mid LCx 100% stenosed, OM2 80% stenosed, proximal RCA 100% stenosed, LIMA-LAD was unable to be cannulated and was anatomically normal and normal in caliber, RIMA-D2 was visualized by non-selective angiography and was anatomically normal, VG-OM2 with a patent, previously placed proximal graft stent, 50% mid VG-OM2 stenosis, VG-OM3 minimal luminal irregularities, VG-RPDA mild diffuse disease), RHC showed high normal pulmonary pressure and filling pressure, normal cardiac output. Following this, he underwent TAVR in 05/2016. Post procedure, he developed a LBBB coupled with a gradual decline in his EF. Pre-TAVR, his EF was 55-60% with follow up study shortly after his procedure showing an EF of 55%. In 2018, his EF was noted to be 45-50% then dropped to 30-35% in 2019. Cardiac cath in 2019, performed for worsening angina, showed significant underlying three-vessel CAD with patent LIMA to LAD, patent RIMA to diagonal, patent SVG to OM3 and patent SVG to rPDA. The SVG to OM2 was found to be occluded. However, OM2 had retrograde flow from SVG to OM3. There was possible left subclavian artery stenosis but it was not significant  by gradient. This was also evaluated by carotid Doppler and there was nothing to suggest significant left subclavian stenosis.  He was admitted to the hospital in 09/2018 with GI bleed and acute blood loss anemia.  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  Anemia improved with IV iron.  Echo in 02/2019, showed an EF of 25-30%, mild aortic stenosis with a mean gradient of 17 mmHg and mild to moderate aortic insufficiency. With continued decline in his LVSF, with an underlying LBBB, he underwent CRT-P implantation in 07/2019.  Echo 08/2020 showed an improved LVSF with an EF of 40-45%, mildly dilated LV cavity size, moderate LVH, Gr1DD, elevated left atrial pressure, moderately reduced RVSF with normal RV cavity size, trivial mitral regurgitation, normal size and function of AVR.  Due to worsening angina, he underwent diagnostic cath in 08/2020 that showed significant underlying 3 vessel CAD with patent grafts including LIMA to LAD, RIMA to diagonal, SVG RCA, and SVG to OM3.  There was no significant change in his coronary anatomy when compared to prior cardiac cath.  He was noted to have native small vessel CAD which was felt to possibly be the culprit for his stable anginal symptoms.  RHC showed normal filling pressures, normal pulmonary pressure, and normal cardiac output.  Most recent echo from 12/2021 demonstrated an EF of 40 to 45% with normal functioning TAVR prosthesis and a mean gradient of 10 mmHg.  Progressive hypotension has previously necessitated the de-escalation of GDMT.    He was seen in the office on 10/11/2019 for noting a 3-week  history of exertional chest and bilateral upper extremity pressure as well as an increase in exertional fatigue and dyspnea.  Outside office had a titrated Imdur to 90 mg.  In this setting, he underwent LHC on 10/14/2023 that showed severe underlying three-vessel CAD with no significant change in coronary anatomy since 2021.  Patent LIMA to LAD and RIMA to diagonal.   Patent SVG to RPDA and SVG to OM 3 with known chronically occluded jump graft to OM 2 and chronically occluded SVG to diagonal.  The prosthetic aortic valve was not crossed.  Medical therapy was recommended with symptoms felt to be likely due to small vessel disease.  He comes in doing reasonably well from a cardiac perspective.  Since undergoing cardiac cath the reported an improvement in his chest pain.  However, he does continue to note some exertional shortness of breath and chest discomfort when walking up from his chicken house.  Symptoms are longstanding and unchanged.  Blood pressure at home is largely in the 130s to 150s systolic with an occasional reading in the lower 100s systolic.  No dizziness, presyncope, or syncope.  No lower extremity swelling or progressive orthopnea.  No falls or symptoms concerning for bleeding.  Currently chest pain-free.   Labs independently reviewed: 09/2023 - Hgb 14.5, PLT 116, potassium 4.1, BUN 18, serum creatinine 1.0, albumin 4.5, AST/ALT normal, TC 144, TG 106, HDL 54, LDL 69, TSH normal   Past Medical History:  Diagnosis Date   Alcohol abuse    stopped drinking 42 years ago from today 2020   Anemia    AS (aortic stenosis)    a. Echo 6/10: EF 55% mild AS; b. echo 06/2015; EF 55-60%, GR1DD, moderate AS, Peak velocity (S): 346 cm/s. Mean gradientS): 28 mm Hg. Peak gradient (S): 48 mm Hg. Valve area (VTI): 1.18 cm2;   c. Echo 4/17 - mild LVH, EF 55-60%, no RWMA, Gr 1 DD, mod to severe AS (mean 28 mmHg, peak 46 mmHg), mild LAE   CAD (coronary artery disease)    a. MI 1996 w/ CABG 1996; b.redo CABG 06/2003; c. Myoview 7/09: EF 54% inferobasal infarct, no ischemia. Myoview 6/10 EF 43% inf wall infarct. no ischemia; c. cath 8/16 s/p DES to VG-OM2. OTW 3VD w/ patent VG->RPDA, VG->OM3,  & LIMA->LAD.  EF 55-65%.   Carotid bruit    2009 0-39% on dopplers bilatrally   Chest pain    GERD (gastroesophageal reflux disease)    History of kidney stones    HOH (hard of  hearing)    HTN (hypertension)    Hyperlipidemia    Myocardial infarction (HCC) 1996   Nephrolithiasis    OSA (obstructive sleep apnea)    PAD (peripheral artery disease) (HCC) 02/2014   Subtotal occlusion of right common iliac artery and 70% stenosis in the left common iliac artery. Status post bilateral kissing stent placement. Significant post stenosis aneurysmal dilatation on the right side (any future catheterization through the right femoral artery should be done cautiously to avoid advancing the wire behind the stent struts)   Shortness of breath    with exertion   Squamous cell carcinoma of skin 12/17/2020   R angle of mandible - SCCIS, ED&C 01/22/2021   Squamous cell carcinoma of skin 12/17/2020   R upper arm SCCIS arising in [place], ED&C 01/22/2021   Squamous cell carcinoma of skin 12/17/2020   R chest - SCCIS, ED&C 01/22/2021   Thrombocytopenia (HCC)     Past Surgical History:  Procedure  Laterality Date   ABDOMINAL AORTAGRAM N/A 03/06/2014   Procedure: ABDOMINAL AORTAGRAM;  Surgeon: Iran Ouch, MD;  Location: [facility] CATH LAB;  Service: Cardiovascular;  Laterality: N/A;   BIV PACEMAKER INSERTION CRT-P N/A 07/23/2019   Procedure: BIV PACEMAKER INSERTION CRT-P;  Surgeon: Duke Salvia, MD;  Location: [facility] INVASIVE CV LAB;  Service: Cardiovascular;  Laterality: N/A;   CARDIAC CATHETERIZATION  02/2014   Severe three-vessel coronary artery disease with patent grafts.    CARDIAC CATHETERIZATION N/A 07/31/2015   Procedure: Right/Left Heart Cath and Coronary/Graft Angiography;  Surgeon: Iran Ouch, MD;  Location: [facility] INVASIVE CV LAB;  Service: Cardiovascular;  Laterality: N/A;   CARDIAC CATHETERIZATION N/A 07/31/2015   Procedure: Coronary Stent Intervention;  Surgeon: Iran Ouch, MD;  Location: [facility] INVASIVE CV LAB;  Service: Cardiovascular;  Laterality: N/A;   CARDIAC CATHETERIZATION N/A 05/26/2016   Procedure: Right/Left Heart Cath and Coronary Angiography;  Surgeon: Iran Ouch, MD;  Location: [facility] INVASIVE CV LAB;  Service: Cardiovascular;  Laterality: N/A;   COLONOSCOPY WITH PROPOFOL N/A 10/07/2018   Procedure: COLONOSCOPY WITH PROPOFOL;  Surgeon: Toney Reil, MD;  Location: [facility] ENDOSCOPY;  Service: Gastroenterology;  Laterality: N/A;   COLONOSCOPY WITH PROPOFOL N/A 01/08/2019   Procedure: COLONOSCOPY WITH PROPOFOL;  Surgeon: Toney Reil, MD;  Location: [facility] ENDOSCOPY;  Service: Gastroenterology;  Laterality: N/A;   CORONARY ARTERY BYPASS GRAFT  1991   at [facility]. Redone 2004-3 vessels 1st time and 4 second time   CORONARY/GRAFT ANGIOGRAPHY Left 10/14/2023   Procedure: CORONARY/GRAFT ANGIOGRAPHY;  Surgeon: Iran Ouch, MD;  Location: [facility] INVASIVE CV LAB;  Service: Cardiovascular;  Laterality: Left;   CYSTO     2/3 times for kidney stones   ENTEROSCOPY N/A 12/25/2018   Procedure: ENTEROSCOPY Single balloon;  Surgeon: Toney Reil, MD;  Location: [facility] ENDOSCOPY;  Service: Gastroenterology;  Laterality: N/A;   ESOPHAGOGASTRODUODENOSCOPY (EGD) WITH PROPOFOL N/A 10/07/2018   Procedure: ESOPHAGOGASTRODUODENOSCOPY (EGD) WITH PROPOFOL;  Surgeon: Toney Reil, MD;  Location: [facility] ENDOSCOPY;  Service: Gastroenterology;  Laterality: N/A;   GIVENS CAPSULE STUDY N/A 12/22/2018   Procedure: GIVENS CAPSULE STUDY;  Surgeon: Toney Reil, MD;  Location: [facility] ENDOSCOPY;  Service: Gastroenterology;  Laterality: N/A;   INGUINAL HERNIA REPAIR  11/10/2011   Procedure: HERNIA REPAIR INGUINAL ADULT;  Surgeon: Fabio Bering;  Location: [facility] ORS;  Service: General;  Laterality: Right;   LAPAROSCOPIC RIGHT COLECTOMY Right 02/09/2019   Procedure: LAPAROSCOPIC RIGHT COLECTOMY;  Surgeon: Leafy Ro, MD;  Location: [facility] ORS;  Service: General;  Laterality: Right;   LEFT HEART CATH AND CORONARY ANGIOGRAPHY Left 05/15/2018   Procedure: LEFT HEART CATH AND CORONARY ANGIOGRAPHY;  Surgeon: Iran Ouch, MD;  Location: [facility] INVASIVE CV LAB;  Service:  Cardiovascular;  Laterality: Left;   LEFT HEART CATHETERIZATION WITH CORONARY/GRAFT ANGIOGRAM N/A 03/06/2014   Procedure: LEFT HEART CATHETERIZATION WITH Isabel Caprice;  Surgeon: Iran Ouch, MD;  Location: [facility] CATH LAB;  Service: Cardiovascular;  Laterality: N/A;   RIGHT HEART CATH AND CORONARY/GRAFT ANGIOGRAPHY N/A 09/05/2020   Procedure: RIGHT HEART CATH AND CORONARY/GRAFT ANGIOGRAPHY;  Surgeon: Iran Ouch, MD;  Location: [facility] INVASIVE CV LAB;  Service: Cardiovascular;  Laterality: N/A;   STOMACH SURGERY     removal of gastric ulcers   TEE WITHOUT CARDIOVERSION N/A 06/01/2016   Procedure: TRANSESOPHAGEAL ECHOCARDIOGRAM (TEE);  Surgeon: Tonny Bollman, MD;  Location: [facility] OR;  Service: Open Heart Surgery;  Laterality: N/A;   TRANSCATHETER AORTIC VALVE  REPLACEMENT, TRANSFEMORAL N/A 06/01/2016   Procedure: TRANSCATHETER AORTIC VALVE REPLACEMENT, TRANSFEMORAL;  Surgeon: Tonny Bollman, MD;  Location: [facility] OR;  Service: Open Heart Surgery;  Laterality: N/A;    Current Medications: Current Meds  Medication Sig   acetaminophen (TYLENOL) 325 MG tablet Take 650 mg by mouth every 6 (six) hours as needed for mild pain or headache.    amitriptyline (ELAVIL) 10 MG tablet Take 1 tablet by mouth at bedtime.   aspirin EC 81 MG tablet Take 1 tablet (81 mg total) by mouth daily. (Patient taking differently: Take 81 mg by mouth every 6 (six) hours as needed (pain.).)   atorvastatin (LIPITOR) 80 MG tablet Take 1 tablet (80 mg total) by mouth daily.   carvedilol (COREG) 3.125 MG tablet Take 1 tablet (3.125 mg total) by mouth 2 (two) times daily.   clopidogrel (PLAVIX) 75 MG tablet Take 1 tablet (75 mg total) by mouth daily.   ezetimibe (ZETIA) 10 MG tablet Take 1 tablet (10 mg total) by mouth daily.   isosorbide mononitrate (IMDUR) 120 MG 24 hr tablet Take 1 tablet (120 mg total) by mouth daily.   Multiple Vitamins-Minerals (PRESERVISION AREDS PO) Take 1 tablet by mouth daily.    niacinamide 500  MG tablet Take 500 mg by mouth 2 (two) times daily with a meal.   pantoprazole (PROTONIX) 40 MG tablet Take 1 tablet (40 mg total) by mouth daily.   sacubitril-valsartan (ENTRESTO) 24-26 MG Take 1 tablet by mouth 2 (two) times daily.   spironolactone (ALDACTONE) 25 MG tablet Take 0.5 tablets (12.5 mg total) by mouth daily.   tamsulosin (FLOMAX) 0.4 MG CAPS capsule Take 1 capsule (0.4 mg total) by mouth daily. Future refill request will need to be sent to the pcp   tiZANidine (ZANAFLEX) 2 MG tablet Take 2 mg by mouth at bedtime as needed for muscle spasms.   TURMERIC PO Take 1,500 mg by mouth at bedtime.   vitamin B-12 (CYANOCOBALAMIN) 1000 MCG tablet Take 1,000 mcg by mouth daily.   [DISCONTINUED] isosorbide mononitrate (IMDUR) 30 MG 24 hr tablet Take by mouth.   [DISCONTINUED] isosorbide mononitrate (IMDUR) 60 MG 24 hr tablet Take 1 tablet (60 mg total) by mouth daily.    Allergies:   Ranitidine and Ranexa [ranolazine er]   Social History   Socioeconomic History   Marital status: Married    Spouse name: Not on file   Number of children: 2   Years of education: Not on file   Highest education level: Not on file  Occupational History   Occupation: Retired  Tobacco Use   Smoking status: Former    Current packs/day: 0.00    Average packs/day: 1 pack/day for 50.0 years (50.0 ttl pk-yrs)    Types: Cigarettes    Start date: 01/25/1940    Quit date: 01/24/1990    Years since quitting: 33.8   Smokeless tobacco: Never  Vaping Use   Vaping status: Never Used  Substance and Sexual Activity   Alcohol use: No    Alcohol/week: 0.0 standard drinks of alcohol    Comment: Alcoholic in past   Drug use: No   Sexual activity: Never  Other Topics Concern   Not on file  Social History Narrative   Married an lives with wife in [place]. Retired form [employer].    Social Determinants of Health   Financial Resource Strain: Not on file  Food Insecurity: Not on file  Transportation  Needs: Not on file  Physical Activity: Not  on file  Stress: Not on file  Social Connections: Not on file     Family History:  The patient's family history includes Heart disease in his father and mother. There is no history of Colon cancer.  ROS:   12-point review of systems is negative unless otherwise noted in the HPI.   EKGs/Labs/Other Studies Reviewed:    Studies reviewed were summarized above. The additional studies were reviewed today:  LHC 10/14/2023:   Prox LAD lesion is 100% stenosed.   Ost Cx lesion is 30% stenosed.   Ost Cx to Prox Cx lesion is 100% stenosed.   Mid Cx lesion is 60% stenosed.   Prox RCA lesion is 100% stenosed.   Origin to Prox Graft lesion is 100% stenosed.   Origin lesion before Acute Mrg  is 20% stenosed.   2nd Mrg lesion is 80% stenosed.   Ost RPDA lesion is 70% stenosed.   Origin to Prox Graft lesion is 100% stenosed.   Mid RCA to Dist RCA lesion is 100% stenosed.   and is normal in caliber.   and is normal in caliber.   LIMA due to inability to cannulate and is normal in caliber.   SVG.   The graft exhibits minimal luminal irregularities.   The graft exhibits severe focal disease.   The graft exhibits no disease.   1.  Severe underlying three-vessel coronary artery disease with no significant change in coronary anatomy since 2021.  Patent LIMA to LAD and RIMA to diagonal.  Patent SVG to right PDA and SVG to OM 3 with known chronically occluded jump graft to OM 2 and chronically occluded SVG to diagonal. Difficult procedure overall due to heavy aortic arch calcifications and significant innominate and subclavian artery tortuosity. 2.  The prosthetic aortic valve was not crossed.   Recommendations: Continue aggressive medical therapy.  Angina is likely due to small vessel disease. __________  2D echo 01/07/2022: 1. Left ventricular ejection fraction, by estimation, is 40 to 45%. The  left ventricle has mild to moderately decreased function.  The left  ventricle demonstrates global hypokinesis. Left ventricular diastolic  parameters are consistent with Grade I  diastolic dysfunction (impaired relaxation).   2. Right ventricular systolic function is normal. The right ventricular  size is normal.   3. Left atrial size was mildly dilated.   4. Right atrial size was mildly dilated.   5. The mitral valve is degenerative. No evidence of mitral valve  regurgitation.   6. The aortic valve has been repaired/replaced. Aortic valve  regurgitation is not visualized. There is a 26 mm Sapien prosthetic (TAVR)  valve present in the aortic position. Procedure Date: 2017. Echo findings  are consistent with normal structure and  function of the aortic valve prosthesis. Aortic valve mean gradient  measures 10.0 mmHg. __________   [facility] 09/05/2020: Prox LAD lesion is 100% stenosed. 2nd Mrg lesion is 80% stenosed. Ost Cx lesion is 30% stenosed. Ost Cx to Prox Cx lesion is 100% stenosed. Mid Cx lesion is 60% stenosed. Prox RCA lesion is 100% stenosed. And is normal in caliber. The graft exhibits minimal luminal irregularities. And is normal in caliber. The graft exhibits severe focal disease. Origin to Prox Graft lesion is 100% stenosed. LIMA due to inability to cannulate and is normal in caliber. The graft exhibits no disease. SVG. Origin lesion before Acute Mrg is 20% stenosed. Ost RPDA lesion is 70% stenosed.   1.  Significant underlying three-vessel coronary artery disease with patent grafts  including LIMA to LAD, RIMA to diagonal, SVG to RCA and SVG to OM 3.  No significant change in coronary anatomy since most recent cardiac catheterization.  The patient does have native small vessel coronary artery disease which might be the culprit for his stable anginal symptoms. 2.  Right heart catheterization showed normal filling pressures, normal pulmonary pressure and normal cardiac output.   Recommendations: Continue medical  therapy. __________   2D echo 09/02/2020: 1. Left ventricular ejection fraction, by estimation, is 40 to 45%. The  left ventricle has mildly decreased function. Left ventricular endocardial  border not optimally defined to evaluate regional wall motion. The left  ventricular internal cavity size  was mildly dilated. There is moderate left ventricular hypertrophy. Left  ventricular diastolic parameters are consistent with Grade I diastolic  dysfunction (impaired relaxation). Elevated left atrial pressure.   2. Right ventricular systolic function is moderately reduced. The right  ventricular size is normal. Tricuspid regurgitation signal is inadequate  for assessing PA pressure.   3. The mitral valve is normal in structure. Trivial mitral valve  regurgitation. No evidence of mitral stenosis.   4. The aortic valve has been repaired/replaced. Aortic valve  regurgitation is not visualized. Echo findings are consistent with normal  structure and function of the aortic valve prosthesis. Aortic valve mean  gradient measures 14.0 mmHg.  __________   2D echo 02/2019:  1. The left ventricle has severely reduced systolic function, with an  ejection fraction of 25-30%. The cavity size was moderately dilated. Left  ventricular diffuse hypokinesis. Unable to exclude regional wall motion  abnormality (challenging image  quality)   2. The right ventricle has normal systolic function. The cavity was  normal. There is no increase in right ventricular wall thickness. Right  ventricular systolic pressure is mildly elevated with an estimated  pressure of 38.0 mmHg.   3. Left atrial size was severely dilated.   4. Aortic valve regurgitation is mild to moderate. Mild stenosis of the  aortic valve. Mean gradient 17 mm Hg.  __________   2D echo 09/2018: - Left ventricle: The cavity size was mildly dilated. Wall    thickness was normal. Systolic function was moderately to    severely reduced. The  estimated ejection fraction was in the    range of 30% to 35%. Wall motion was normal; there were no    regional wall motion abnormalities. Features are consistent with    a pseudonormal left ventricular filling pattern, with concomitant    abnormal relaxation and increased filling pressure (grade 2    diastolic dysfunction).  - Aortic valve: A bioprosthesis was present and functioning    normally. s/p TAVR. Mean gradient (S): 13 mm Hg.  - Mitral valve: Calcified annulus. There was mild regurgitation.  - Left atrium: The atrium was moderately dilated.  - Pulmonary arteries: Systolic pressure could not be accurately    estimated.  __________   LHC 05/2018: Prox LAD lesion is 100% stenosed. 2nd Mrg lesion is 80% stenosed. Prox RCA lesion is 100% stenosed. And is normal in caliber. The graft exhibits minimal luminal irregularities. And is normal in caliber. The graft exhibits severe focal disease. LIMA due to inability to cannulate and is normal in caliber. The graft exhibits no disease. Origin lesion is 40% stenosed. Ost Cx to Prox Cx lesion is 100% stenosed. Ost Cx lesion is 30% stenosed. Mid Cx lesion is 60% stenosed. Origin to Prox Graft lesion is 100% stenosed.   1.  Significant underlying  three-vessel coronary artery disease with patent LIMA to LAD, patent RIMA to diagonal, patent SVG to OM 3 and patent SVG to right PDA.  The SVG to OM 2 is now occluded which is a new finding.  However, OM2 gets retrograde flow from SVG to OM 3. 2.  Possible significant left subclavian artery stenosis although no gradient was noted with pullback.   Recommendations: Overall difficult procedure due to significant tortuosity of the innominate artery and left subclavian artery.  No coronary revascularization is needed.  We have to investigate the significance of left subclavian artery stenosis which might be causing decreased flow into the LIMA to LAD.  Recommend carotid Doppler with focus on the  left subclavian artery.  Endovascular intervention of the left subclavian artery is not straightforward due to tortuosity and also close origin to the LIMA. The patient is noted to have frequent PVCs before and throughout the cath.  This might be contributing to his cardiomyopathy.  Recommend a 24-hour Holter monitor. __________   24-hour Holter 05/2018: 5600 PVCs in 24 hours representing 6% burden. Occasional PACs. Average heart rate of 62 bpm. __________   2D echo 04/2018: - Left ventricle: The cavity size was normal. Systolic function was    moderately to severely reduced. The estimated ejection fraction    was in the range of 30% to 35%. Diffuse hypokinesis. Regional    wall motion abnormalities cannot be excluded. Features are    consistent with a pseudonormal left ventricular filling pattern,    with concomitant abnormal relaxation and increased filling    pressure (grade 2 diastolic dysfunction).  - Aortic valve: A bioprosthesis was present. s/p TAVR Peak velocity    (S): 250 cm/s. Mean gradient (S): 14 mm Hg.  - Aortic root: The aortic root was mildly dilated.  - Mitral valve: There was mild regurgitation.  - Left atrium: The atrium was moderately dilated.  - Right ventricle: Systolic function was normal.  - Pulmonary arteries: Systolic pressure was within the normal    range.  __________   2D echo 05/2017: - Left ventricle: The cavity size was mildly dilated. There was    mild concentric hypertrophy. Systolic function was mildly    reduced. The estimated ejection fraction was in the range of 45%    to 50%. Doppler parameters are consistent with abnormal left    ventricular relaxation (grade 1 diastolic dysfunction).  - Ventricular septum: Septal motion showed abnormal function and    dyssynergy.  - Aortic valve: A TAVR prosthesis was present and functioning    normally. Mean gradient (S): 11 mm Hg. Peak gradient (S): 20 mm    Hg. Valve area (VTI): 1.79 cm^2.  - Left  atrium: The atrium was moderately to severely dilated.  - Right atrium: The atrium was mildly dilated.   Impressions:   - Comapred to most recent echo, the LV is mildly more dilated with    mildly reduced EF.  __________   2D echo 06/2016: - Left ventricle: LVEF is approximately 55% with mid/distal    inferior hypokiensis; septal hypokdinesis; basal inferior    akinesis with aneurysmal dilitation. The cavity size was mildly    dilated. Wall thickness was normal. Doppler parameters are    consistent with abnormal left ventricular relaxation (grade 1    diastolic dysfunction).  - Aortic valve: AV prosthesis is well seated Peak and mean    gradients through the valve are 19 and 11 mm Hg respectively.  - Mitral valve: Calcified  annulus. Mildly thickened leaflets .  - Left atrium: The atrium was severely dilated.  __________   2D echo 05/2016: - Left ventricle: Posterior basal akinesis The cavity size was    mildly dilated. Systolic function was normal. The estimated    ejection fraction was in the range of 50% to 55%. Doppler    parameters are consistent with both elevated ventricular    end-diastolic filling pressure and elevated left atrial filling    pressure.  - Aortic valve: Normal appearing 26 mm Sapien 3 valve with no    significant perivalvular regurgitation post TAVR. Valve area    (VTI): 0.81 cm^2. Valve area (Vmax): 0.83 cm^2. Valve area    (Vmean): 0.85 cm^2.  - Left atrium: The atrium was moderately dilated.  - Atrial septum: No defect or patent foramen ovale was identified. __________   TEE 05/2016: - Aortic valve: Valve area (VTI): 1.94 cm^2. Valve area (Vmax):    1.99 cm^2. Valve area (Vmean): 2.06 cm^2.  - Impressions: Pre-TAVR: EF 50-55% posterior basal akinesis. No    effusion. Mild MR. Normal RV with mild TR. No ASD/PFO. Aortic    valve trileaflet    and severely calcified. Peak velocity 4.3 m/sec, peak gradient 73    mmHg mean gradient 45 mmHg. Mild to  moderate AR.       Post TAVR: Slightly high plaecement of 26 mm Sapien 3 valve.    Trivial perivalvular regurgitation at 2:00 on SA views. Peak    gradient 4 mmHg    mean gradient 2 mmHg peak velocity .98 m/sec AVA 2.36 cm2. EF    remains 50-55% with posteior basal akinesis No new RWMA;s Mild MR    RV normal with no increase in PA pressures No new effusion aorti    root intact.   Impressions:   - Pre-TAVR: EF 50-55% posterior basal akinesis. No effusion. Mild    MR. Normal RV with mild TR. No ASD/PFO. Aortic valve trileaflet    and severely calcified. Peak velocity 4.3 m/sec, peak gradient 73    mmHg mean gradient 45 mmHg. Mild to moderate AR. __________   [facility] 05/2016: Prox RCA lesion, 100% stenosed. SVG was injected is normal in caliber. There is mild diffuse disease in the graft. Mid Cx lesion, 100% stenosed. SVG was injected is normal in caliber. The graft exhibits minimal luminal irregularities. was injected is normal in caliber. There is severe focal disease in the graft. 2nd Mrg lesion, 80% stenosed. LIMA due to inability to cannulate and is normal in caliber, and is anatomically normal. Prox LAD lesion, 100% stenosed. Mid Graft lesion, 50% stenosed. RIMA was visualized by non-selective angiography is and is anatomically normal.   1. Right heart catheterization showed high normal pulmonary pressure and filling pressures. Normal cardiac output. 2. Severe underlying three-vessel coronary artery disease with patent grafts. 3. Moderate left subclavian stenosis not significant by gradients.   Recommendations: No revascularization is needed before TAVR.   EKG:  EKG is not ordered today.    Recent Labs: No results found for requested labs within last 365 days.  Recent Lipid Panel    Component Value Date/Time   CHOL 144 01/07/2022 0754   TRIG 120 01/07/2022 0754   HDL 56 01/07/2022 0754   CHOLHDL 2.6 01/07/2022 0754   CHOLHDL 3.0 12/18/2020 1635   VLDL 23  12/18/2020 1635   LDLCALC 67 01/07/2022 0754   LDLDIRECT 83.3 12/18/2020 1635    PHYSICAL EXAM:    VS:  BP  104/62 (BP Location: Left Arm, Patient Position: Sitting, Cuff Size: Normal)   Pulse 71   Ht 5\' 9"  (1.753 m)   Wt 187 lb 9.6 oz (85.1 kg)   SpO2 96%   BMI 27.70 kg/m   BMI: Body mass index is 27.7 kg/m.  Physical Exam Vitals reviewed.  Constitutional:      Appearance: He is well-developed.  HENT:     Head: Normocephalic and atraumatic.  Eyes:     General:        Right eye: No discharge.        Left eye: No discharge.  Neck:     Vascular: No JVD.  Cardiovascular:     Rate and Rhythm: Normal rate and regular rhythm.     Pulses:          Posterior tibial pulses are 2+ on the right side and 2+ on the left side.     Heart sounds: S1 normal and S2 normal. Heart sounds not distant. No midsystolic click and no opening snap. Murmur heard.     Systolic murmur is present with a grade of 2/6 at the upper right sternal border.     No friction rub.  Pulmonary:     Effort: Pulmonary effort is normal. No respiratory distress.     Breath sounds: Normal breath sounds. No decreased breath sounds, wheezing, rhonchi or rales.  Chest:     Chest wall: No tenderness.  Abdominal:     General: There is no distension.  Musculoskeletal:     Cervical back: Normal range of motion.     Right lower leg: No edema.     Left lower leg: No edema.  Skin:    General: Skin is warm and dry.     Nails: There is no clubbing.  Neurological:     Mental Status: He is alert and oriented to person, place, and time.  Psychiatric:        Speech: Speech normal.        Behavior: Behavior normal.        Thought Content: Thought content normal.        Judgment: Judgment normal.     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  Reports an improvement in chest discomfort following cardiac cath without intervention.  LHC showed stable coronary anatomy with known multivessel native CAD and patent LIMA to LAD and RIMA to diagonal along with patent SVG to PDA and SVG to OM 3 with known chronically occluded graft to OM 2 and chronically occluded SVG to diagonal.  Suspect anginal symptoms are related to small vessel disease.  However, he does continue to note some exertional chest discomfort when walking up from a chicken house.  Increase Imdur from 90 mg daily to 120 mg daily.  Twice daily.  Intolerant to ranolazine secondary to rash.  Continue aspirin and clopidogrel along with atorvastatin and ezetimibe.  No indication for further ischemic testing at this time.  HFrEF secondary to ICM with LBBB status post CRT-P: Euvolemic and well compensated.  Relative hypotension has previously led to de-escalation of GDMT.  Remains on low-dose carvedilol, Entresto, and spironolactone.  Recent labs stable.  Echo pending.  Severe aortic stenosis status post TAVR: Most recent echo showed  normal functioning prosthesis with a mean gradient of 10 mmHg. Echo pending. Remains on aspirin. SBE prophylaxis is indicated for dental procedures.   HTN: Blood pressure is well-controlled in the office today.  Continue medical therapy as outlined above.  HLD: LDL 69 in 09/2023 with normal AST/ALT at that time.  He remains on atorvastatin and ezetimibe.  PAD: No symptoms of claudication following bilateral iliac stenting.  Remains on aspirin and statin as outlined above.     Disposition: F/u with Dr. Kirke Corin or an APP in 2 months, and EP as directed.    Medication Adjustments/Labs and Tests Ordered: Current medicines are reviewed at length with the patient today.  Concerns regarding medicines are outlined above. Medication changes, Labs and Tests ordered today are summarized above and listed in the Patient Instructions accessible in  Encounters.   Signed, Eula Listen, PA-C 11/18/2023 9:38 AM     [facility] -  [address] [place], [place] [address] [phone]

## 2023-11-16 ENCOUNTER — Encounter: Payer: Self-pay | Admitting: Oncology

## 2023-11-17 ENCOUNTER — Other Ambulatory Visit: Payer: Self-pay | Admitting: Cardiovascular Disease

## 2023-11-18 ENCOUNTER — Ambulatory Visit: Payer: Medicare HMO | Attending: Physician Assistant | Admitting: Physician Assistant

## 2023-11-18 ENCOUNTER — Encounter: Payer: Self-pay | Admitting: Physician Assistant

## 2023-11-18 VITALS — BP 104/62 | HR 71 | Ht 69.0 in | Wt 187.6 lb

## 2023-11-18 DIAGNOSIS — I255 Ischemic cardiomyopathy: Secondary | ICD-10-CM

## 2023-11-18 DIAGNOSIS — E785 Hyperlipidemia, unspecified: Secondary | ICD-10-CM | POA: Diagnosis not present

## 2023-11-18 DIAGNOSIS — I25118 Atherosclerotic heart disease of native coronary artery with other forms of angina pectoris: Secondary | ICD-10-CM | POA: Diagnosis not present

## 2023-11-18 DIAGNOSIS — I350 Nonrheumatic aortic (valve) stenosis: Secondary | ICD-10-CM

## 2023-11-18 DIAGNOSIS — I739 Peripheral vascular disease, unspecified: Secondary | ICD-10-CM

## 2023-11-18 DIAGNOSIS — Z952 Presence of prosthetic heart valve: Secondary | ICD-10-CM

## 2023-11-18 DIAGNOSIS — I5020 Unspecified systolic (congestive) heart failure: Secondary | ICD-10-CM | POA: Diagnosis not present

## 2023-11-18 DIAGNOSIS — I10 Essential (primary) hypertension: Secondary | ICD-10-CM

## 2023-11-18 MED ORDER — ISOSORBIDE MONONITRATE ER 120 MG PO TB24: 120.0000 mg | ORAL_TABLET | Freq: Every day | ORAL | 3 refills | Status: DC

## 2023-11-18 NOTE — Patient Instructions (Signed)
Medication Instructions:  Your physician recommends the following medication changes.  INCREASE: Imdur 120 mg daily   *If you need a refill on your cardiac medications before your next appointment, please call your pharmacy*   Lab Work: None ordered at this time    Follow-Up: At [facility], you and your health needs are our priority.  As part of our continuing mission to provide you with exceptional heart care, we have created designated Provider Care Teams.  These Care Teams include your primary Cardiologist (physician) and Advanced Practice Providers (APPs -  Physician Assistants and Nurse Practitioners) who all work together to provide you with the care you need, when you need it.  We recommend signing up for the patient portal called "MyChart".  Sign up information is provided on this After Visit Summary.  MyChart is used to connect with patients for Virtual Visits (Telemedicine).  Patients are able to view lab/test results, encounter notes, upcoming appointments, etc.  Non-urgent messages can be sent to your provider as well.   To learn more about what you can do with MyChart, go to [email or web address].    Your next appointment:   2 month(s)  Provider:   You may see [name], MD or one of the following Advanced Practice Providers on your designated Care Team:   Eula Listen, [place]

## 2023-11-21 NOTE — Progress Notes (Signed)
Remote pacemaker transmission.   

## 2023-12-03 ENCOUNTER — Other Ambulatory Visit: Payer: Self-pay | Admitting: Cardiovascular Disease

## 2023-12-16 ENCOUNTER — Encounter: Payer: Self-pay | Admitting: Oncology

## 2023-12-23 ENCOUNTER — Encounter: Payer: Self-pay | Admitting: Oncology

## 2023-12-26 ENCOUNTER — Encounter: Payer: Self-pay | Admitting: Cardiovascular Disease

## 2023-12-30 ENCOUNTER — Ambulatory Visit: Payer: Medicare Other | Attending: Physician Assistant

## 2023-12-30 DIAGNOSIS — I350 Nonrheumatic aortic (valve) stenosis: Secondary | ICD-10-CM | POA: Diagnosis not present

## 2023-12-30 LAB — ECHOCARDIOGRAM COMPLETE
AV Mean grad: 7 mm[Hg]
AV Peak grad: 12 mm[Hg]
Ao pk vel: 1.73 m/s
Area-P 1/2: 3.6 cm2
S' Lateral: 5.8 cm
Single Plane A4C EF: 40.3 %

## 2023-12-30 MED ORDER — PERFLUTREN LIPID MICROSPHERE
1.0000 mL | INTRAVENOUS | Status: AC | PRN
  Administered 2023-12-30: 3 mL via INTRAVENOUS

## 2024-01-08 ENCOUNTER — Encounter: Payer: Self-pay | Admitting: Cardiovascular Disease

## 2024-01-09 MED ORDER — SPIRONOLACTONE 25 MG PO TABS: 12.5000 mg | ORAL_TABLET | Freq: Every day | ORAL | 3 refills | Status: DC

## 2024-01-09 MED ORDER — EZETIMIBE 10 MG PO TABS: 10.0000 mg | ORAL_TABLET | Freq: Every day | ORAL | 3 refills | Status: DC

## 2024-01-09 MED ORDER — CLOPIDOGREL BISULFATE 75 MG PO TABS: 75.0000 mg | ORAL_TABLET | Freq: Every day | ORAL | 0 refills | Status: DC

## 2024-01-09 MED ORDER — ATORVASTATIN CALCIUM 80 MG PO TABS: 80.0000 mg | ORAL_TABLET | Freq: Every day | ORAL | 3 refills | Status: DC

## 2024-01-09 MED ORDER — CARVEDILOL 3.125 MG PO TABS: 3.1250 mg | ORAL_TABLET | Freq: Two times a day (BID) | ORAL | 3 refills | Status: DC

## 2024-01-09 MED ORDER — ISOSORBIDE MONONITRATE ER 120 MG PO TB24: 120.0000 mg | ORAL_TABLET | Freq: Every day | ORAL | 3 refills | Status: DC

## 2024-01-24 ENCOUNTER — Ambulatory Visit: Payer: Medicare Other | Attending: Physician Assistant | Admitting: Physician Assistant

## 2024-01-24 ENCOUNTER — Ambulatory Visit (INDEPENDENT_AMBULATORY_CARE_PROVIDER_SITE_OTHER): Payer: Medicare HMO

## 2024-01-24 ENCOUNTER — Encounter: Payer: Self-pay | Admitting: Physician Assistant

## 2024-01-24 VITALS — BP 110/62 | HR 71 | Ht 69.0 in | Wt 196.4 lb

## 2024-01-24 DIAGNOSIS — I350 Nonrheumatic aortic (valve) stenosis: Secondary | ICD-10-CM

## 2024-01-24 DIAGNOSIS — I739 Peripheral vascular disease, unspecified: Secondary | ICD-10-CM

## 2024-01-24 DIAGNOSIS — I255 Ischemic cardiomyopathy: Secondary | ICD-10-CM

## 2024-01-24 DIAGNOSIS — I25118 Atherosclerotic heart disease of native coronary artery with other forms of angina pectoris: Secondary | ICD-10-CM | POA: Diagnosis not present

## 2024-01-24 DIAGNOSIS — Z952 Presence of prosthetic heart valve: Secondary | ICD-10-CM

## 2024-01-24 DIAGNOSIS — I5020 Unspecified systolic (congestive) heart failure: Secondary | ICD-10-CM | POA: Diagnosis not present

## 2024-01-24 DIAGNOSIS — I10 Essential (primary) hypertension: Secondary | ICD-10-CM

## 2024-01-24 DIAGNOSIS — E785 Hyperlipidemia, unspecified: Secondary | ICD-10-CM

## 2024-01-24 NOTE — Patient Instructions (Signed)
Medication Instructions:  Your Physician recommend you continue on your current medication as directed.    *If you need a refill on your cardiac medications before your next appointment, please call your pharmacy*   Lab Work: None ordered at this time  If you have labs (blood work) drawn today and your tests are completely normal, you will receive your results only by: MyChart Message (if you have MyChart) OR A paper copy in the mail If you have any lab test that is abnormal or we need to change your treatment, we will call you to review the results.   Follow-Up: At [facility], you and your health needs are our priority.  As part of our continuing mission to provide you with exceptional heart care, we have created designated Provider Care Teams.  These Care Teams include your primary Cardiologist (physician) and Advanced Practice Providers (APPs -  Physician Assistants and Nurse Practitioners) who all work together to provide you with the care you need, when you need it.  We recommend signing up for the patient portal called "MyChart".  Sign up information is provided on this After Visit Summary.  MyChart is used to connect with patients for Virtual Visits (Telemedicine).  Patients are able to view lab/test results, encounter notes, upcoming appointments, etc.  Non-urgent messages can be sent to your provider as well.   To learn more about what you can do with MyChart, go to [email or web address].    Your next appointment:   6 month(s)  Provider:   You may see [name], MD or one of the following Advanced Practice Providers on your designated Care Team:   Nicolasa Ducking, NP Eula Listen, PA-C Cadence Fransico Michael, PA-C Charlsie Quest, NP Carlos Levering, NP

## 2024-01-24 NOTE — Progress Notes (Signed)
Cardiology Office Note    Date:  01/24/2024   ID:  Matthew [name], DOB 27-Aug-1936, MRN [id number]  PCP:  [name], MD  Cardiologist:  [name], MD  Electrophysiologist:  [name], MD   Chief Complaint: Follow up  History of Present Illness:   Matthew [name] is a 88 y.o. male with history of CAD status post CABG in 1991 with redo CABG in 2004, aortic stenosis status post TAVR in 05/2016 with post procedure LBBB, HFrEF secondary to ICM status post CRT-P in 07/2019, PAD status post prior intervention, HTN, HLD, GI bleeding in 09/2018 with gastric AVMs noted status post therapy with acute blood loss anemia, and PVCs who presents for follow-up of echo.  R/LHC in 05/2016, prior to his TAVR, showed severe underlying 3-vessel CAD with patent grafts (proximal LAD 100% stenosed, mid LCx 100% stenosed, OM2 80% stenosed, proximal RCA 100% stenosed, LIMA-LAD was unable to be cannulated and was anatomically normal and normal in caliber, RIMA-D2 was visualized by non-selective angiography and was anatomically normal, VG-OM2 with a patent, previously placed proximal graft stent, 50% mid VG-OM2 stenosis, VG-OM3 minimal luminal irregularities, VG-RPDA mild diffuse disease), RHC showed high normal pulmonary pressure and filling pressure, normal cardiac output. Following this, he underwent TAVR in 05/2016. Post procedure, he developed a LBBB coupled with a gradual decline in his EF. Pre-TAVR, his EF was 55-60% with follow up study shortly after his procedure showing an EF of 55%. In 2018, his EF was noted to be 45-50% then dropped to 30-35% in 2019. Cardiac cath in 2019, performed for worsening angina, showed significant underlying three-vessel CAD with patent LIMA to LAD, patent RIMA to diagonal, patent SVG to OM3 and patent SVG to rPDA. The SVG to OM2 was found to be occluded. However, OM2 had retrograde flow from SVG to OM3. There was possible left subclavian artery stenosis but it was not  significant by gradient. This was also evaluated by carotid Doppler and there was nothing to suggest significant left subclavian stenosis.  He was admitted to the hospital in 09/2018 with GI bleed and acute blood loss anemia.  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  Anemia improved with IV iron.  Echo in 02/2019, showed an EF of 25-30%, mild aortic stenosis with a mean gradient of 17 mmHg and mild to moderate aortic insufficiency. With continued decline in his LVSF, with an underlying LBBB, he underwent CRT-P implantation in 07/2019.  Echo 08/2020 showed an improved LVSF with an EF of 40-45%, mildly dilated LV cavity size, moderate LVH, Gr1DD, elevated left atrial pressure, moderately reduced RVSF with normal RV cavity size, trivial mitral regurgitation, normal size and function of AVR.  Due to worsening angina, he underwent diagnostic cath in 08/2020 that showed significant underlying 3 vessel CAD with patent grafts including LIMA to LAD, RIMA to diagonal, SVG RCA, and SVG to OM3.  There was no significant change in his coronary anatomy when compared to prior cardiac cath.  He was noted to have native small vessel CAD which was felt to possibly be the culprit for his stable anginal symptoms.  RHC showed normal filling pressures, normal pulmonary pressure, and normal cardiac output.  Echo from 12/2021 demonstrated an EF of 40 to 45% with normal functioning TAVR prosthesis and a mean gradient of 10 mmHg.  Progressive hypotension has previously necessitated the de-escalation of GDMT.     He was seen in the office on 10/11/2023 noting a 3-week history  of exertional chest and bilateral upper extremity pressure as well as an increase in exertional fatigue and dyspnea.  Outside office had a titrated Imdur to 90 mg.  In this setting, he underwent LHC on 10/14/2023 that showed severe underlying three-vessel CAD with no significant change in coronary anatomy since 2021.  Patent LIMA to LAD and RIMA to diagonal.   Patent SVG to RPDA and SVG to OM 3 with known chronically occluded jump graft to OM 2 and chronically occluded SVG to diagonal.  The prosthetic aortic valve was not crossed.  Medical therapy was recommended with symptoms felt to be likely due to small vessel disease.  He was seen in follow-up in 11/2023 noting an improvement in chest pain.  He did continue to note exertional shortness of breath and some chest discomfort when walking up from his chicken house.  The symptoms were longstanding and unchanged.  Subsequent echo on 12/30/2023 showed an EF of 30 to 35%, global hypokinesis with severe hypokinesis of the basal inferolateral wall, moderately dilated LV internal cavity size, grade 1 diastolic dysfunction, mildly reduced RV systolic function with mildly enlarged ventricular cavity size, mild mitral regurgitation, and prior TAVR with a mean gradient of 7 mmHg.   He comes in accompanied by his wife today and is doing well from a cardiac perspective, without symptoms of angina or cardiac decompensation.  Both patient and wife indicate the patient's dyspnea is improved when compared to his last visit.  He is now taking his time with activities.  No dizziness, presyncope, or syncope.  No falls, hematochezia, or melena.  Blood pressure remains well-controlled at home.  Adherent to cardiac medications.  No acute concerns at this time.   Labs independently reviewed: 09/2023 - Hgb 14.5, PLT 116, potassium 4.1, BUN 18, serum creatinine 1.0, albumin 4.5, AST/ALT normal, TC 144, TG 106, HDL 54, LDL 69, TSH normal   Past Medical History:  Diagnosis Date   Alcohol abuse    stopped drinking 42 years ago from today 2020   Anemia    AS (aortic stenosis)    a. Echo 6/10: EF 55% mild AS; b. echo 06/2015; EF 55-60%, GR1DD, moderate AS, Peak velocity (S): 346 cm/s. Mean gradientS): 28 mm Hg. Peak gradient (S): 48 mm Hg. Valve area (VTI): 1.18 cm2;   c. Echo 4/17 - mild LVH, EF 55-60%, no RWMA, Gr 1 DD, mod to severe AS  (mean 28 mmHg, peak 46 mmHg), mild LAE   CAD (coronary artery disease)    a. MI 1996 w/ CABG 1996; b.redo CABG 06/2003; c. Myoview 7/09: EF 54% inferobasal infarct, no ischemia. Myoview 6/10 EF 43% inf wall infarct. no ischemia; c. cath 8/16 s/p DES to VG-OM2. OTW 3VD w/ patent VG->RPDA, VG->OM3,  & LIMA->LAD.  EF 55-65%.   Carotid bruit    2009 0-39% on dopplers bilatrally   Chest pain    GERD (gastroesophageal reflux disease)    History of kidney stones    HOH (hard of hearing)    HTN (hypertension)    Hyperlipidemia    Myocardial infarction (HCC) 1996   Nephrolithiasis    OSA (obstructive sleep apnea)    PAD (peripheral artery disease) (HCC) 02/2014   Subtotal occlusion of right common iliac artery and 70% stenosis in the left common iliac artery. Status post bilateral kissing stent placement. Significant post stenosis aneurysmal dilatation on the right side (any future catheterization through the right femoral artery should be done cautiously to avoid advancing the wire behind  the stent struts)   Shortness of breath    with exertion   Squamous cell carcinoma of skin 12/17/2020   R angle of mandible - SCCIS, ED&C 01/22/2021   Squamous cell carcinoma of skin 12/17/2020   R upper arm SCCIS arising in [place], ED&C 01/22/2021   Squamous cell carcinoma of skin 12/17/2020   R chest - SCCIS, ED&C 01/22/2021   Thrombocytopenia (HCC)     Past Surgical History:  Procedure Laterality Date   ABDOMINAL AORTAGRAM N/A 03/06/2014   Procedure: ABDOMINAL Ronny Flurry;  Surgeon: Iran Ouch, MD;  Location: [facility] CATH LAB;  Service: Cardiovascular;  Laterality: N/A;   BIV PACEMAKER INSERTION CRT-P N/A 07/23/2019   Procedure: BIV PACEMAKER INSERTION CRT-P;  Surgeon: Duke Salvia, MD;  Location: [facility] INVASIVE CV LAB;  Service: Cardiovascular;  Laterality: N/A;   CARDIAC CATHETERIZATION  02/2014   Severe three-vessel coronary artery disease with patent grafts.    CARDIAC CATHETERIZATION N/A 07/31/2015    Procedure: Right/Left Heart Cath and Coronary/Graft Angiography;  Surgeon: Iran Ouch, MD;  Location: [facility] INVASIVE CV LAB;  Service: Cardiovascular;  Laterality: N/A;   CARDIAC CATHETERIZATION N/A 07/31/2015   Procedure: Coronary Stent Intervention;  Surgeon: Iran Ouch, MD;  Location: [facility] INVASIVE CV LAB;  Service: Cardiovascular;  Laterality: N/A;   CARDIAC CATHETERIZATION N/A 05/26/2016   Procedure: Right/Left Heart Cath and Coronary Angiography;  Surgeon: Iran Ouch, MD;  Location: [facility] INVASIVE CV LAB;  Service: Cardiovascular;  Laterality: N/A;   COLONOSCOPY WITH PROPOFOL N/A 10/07/2018   Procedure: COLONOSCOPY WITH PROPOFOL;  Surgeon: Toney Reil, MD;  Location: [facility] ENDOSCOPY;  Service: Gastroenterology;  Laterality: N/A;   COLONOSCOPY WITH PROPOFOL N/A 01/08/2019   Procedure: COLONOSCOPY WITH PROPOFOL;  Surgeon: Toney Reil, MD;  Location: [facility] ENDOSCOPY;  Service: Gastroenterology;  Laterality: N/A;   CORONARY ARTERY BYPASS GRAFT  1991   at [facility]. Redone 2004-3 vessels 1st time and 4 second time   CORONARY/GRAFT ANGIOGRAPHY Left 10/14/2023   Procedure: CORONARY/GRAFT ANGIOGRAPHY;  Surgeon: Iran Ouch, MD;  Location: [facility] INVASIVE CV LAB;  Service: Cardiovascular;  Laterality: Left;   CYSTO     2/3 times for kidney stones   ENTEROSCOPY N/A 12/25/2018   Procedure: ENTEROSCOPY Single balloon;  Surgeon: Toney Reil, MD;  Location: [facility] ENDOSCOPY;  Service: Gastroenterology;  Laterality: N/A;   ESOPHAGOGASTRODUODENOSCOPY (EGD) WITH PROPOFOL N/A 10/07/2018   Procedure: ESOPHAGOGASTRODUODENOSCOPY (EGD) WITH PROPOFOL;  Surgeon: Toney Reil, MD;  Location: [facility] ENDOSCOPY;  Service: Gastroenterology;  Laterality: N/A;   GIVENS CAPSULE STUDY N/A 12/22/2018   Procedure: GIVENS CAPSULE STUDY;  Surgeon: Toney Reil, MD;  Location: [facility] ENDOSCOPY;  Service: Gastroenterology;  Laterality: N/A;   INGUINAL HERNIA REPAIR  11/10/2011   Procedure:  HERNIA REPAIR INGUINAL ADULT;  Surgeon: Fabio Bering;  Location: [facility] ORS;  Service: General;  Laterality: Right;   LAPAROSCOPIC RIGHT COLECTOMY Right 02/09/2019   Procedure: LAPAROSCOPIC RIGHT COLECTOMY;  Surgeon: Leafy Ro, MD;  Location: [facility] ORS;  Service: General;  Laterality: Right;   LEFT HEART CATH AND CORONARY ANGIOGRAPHY Left 05/15/2018   Procedure: LEFT HEART CATH AND CORONARY ANGIOGRAPHY;  Surgeon: Iran Ouch, MD;  Location: [facility] INVASIVE CV LAB;  Service: Cardiovascular;  Laterality: Left;   LEFT HEART CATHETERIZATION WITH CORONARY/GRAFT ANGIOGRAM N/A 03/06/2014   Procedure: LEFT HEART CATHETERIZATION WITH Isabel Caprice;  Surgeon: Iran Ouch, MD;  Location: [facility] CATH LAB;  Service: Cardiovascular;  Laterality: N/A;   RIGHT HEART CATH  AND CORONARY/GRAFT ANGIOGRAPHY N/A 09/05/2020   Procedure: RIGHT HEART CATH AND CORONARY/GRAFT ANGIOGRAPHY;  Surgeon: Iran Ouch, MD;  Location: [facility] INVASIVE CV LAB;  Service: Cardiovascular;  Laterality: N/A;   STOMACH SURGERY     removal of gastric ulcers   TEE WITHOUT CARDIOVERSION N/A 06/01/2016   Procedure: TRANSESOPHAGEAL ECHOCARDIOGRAM (TEE);  Surgeon: Tonny Bollman, MD;  Location: [facility] OR;  Service: Open Heart Surgery;  Laterality: N/A;   TRANSCATHETER AORTIC VALVE REPLACEMENT, TRANSFEMORAL N/A 06/01/2016   Procedure: TRANSCATHETER AORTIC VALVE REPLACEMENT, TRANSFEMORAL;  Surgeon: Tonny Bollman, MD;  Location: [facility] OR;  Service: Open Heart Surgery;  Laterality: N/A;    Current Medications: Current Meds  Medication Sig   acetaminophen (TYLENOL) 325 MG tablet Take 650 mg by mouth every 6 (six) hours as needed for mild pain or headache.    amitriptyline (ELAVIL) 10 MG tablet Take 1 tablet by mouth at bedtime.   aspirin EC 81 MG tablet Take 1 tablet (81 mg total) by mouth daily. (Patient taking differently: Take 81 mg by mouth every 6 (six) hours as needed (pain.).)   atorvastatin (LIPITOR) 80 MG tablet Take 1 tablet (80  mg total) by mouth daily.   carvedilol (COREG) 3.125 MG tablet Take 1 tablet (3.125 mg total) by mouth 2 (two) times daily.   clopidogrel (PLAVIX) 75 MG tablet Take 1 tablet (75 mg total) by mouth daily.   ezetimibe (ZETIA) 10 MG tablet Take 1 tablet (10 mg total) by mouth daily.   isosorbide mononitrate (IMDUR) 120 MG 24 hr tablet Take 1 tablet (120 mg total) by mouth daily.   Multiple Vitamins-Minerals (PRESERVISION AREDS PO) Take 1 tablet by mouth daily.    niacinamide 500 MG tablet Take 500 mg by mouth 2 (two) times daily with a meal.   pantoprazole (PROTONIX) 40 MG tablet TAKE 1 TABLET EVERY DAY   Polypodium Leucotomos (HELIOCARE PO) Take 1 capsule by mouth every 4 (four) hours as needed (with sun exposure).   sacubitril-valsartan (ENTRESTO) 24-26 MG Take 1 tablet by mouth 2 (two) times daily.   spironolactone (ALDACTONE) 25 MG tablet Take 0.5 tablets (12.5 mg total) by mouth daily.   tamsulosin (FLOMAX) 0.4 MG CAPS capsule Take 1 capsule (0.4 mg total) by mouth daily. Future refill request will need to be sent to the pcp   tiZANidine (ZANAFLEX) 2 MG tablet Take 2 mg by mouth at bedtime as needed for muscle spasms.   TURMERIC PO Take 1,500 mg by mouth at bedtime.   vitamin B-12 (CYANOCOBALAMIN) 1000 MCG tablet Take 1,000 mcg by mouth daily.    Allergies:   Ranitidine and Ranexa [ranolazine er]   Social History   Socioeconomic History   Marital status: Married    Spouse name: Not on file   Number of children: 2   Years of education: Not on file   Highest education level: Not on file  Occupational History   Occupation: Retired  Tobacco Use   Smoking status: Former    Current packs/day: 0.00    Average packs/day: 1 pack/day for 50.0 years (50.0 ttl pk-yrs)    Types: Cigarettes    Start date: 01/25/1940    Quit date: 01/24/1990    Years since quitting: 34.0   Smokeless tobacco: Never  Vaping Use   Vaping status: Never Used  Substance and Sexual Activity   Alcohol use: No     Alcohol/week: 0.0 standard drinks of alcohol    Comment: Alcoholic in past   Drug use: No  Sexual activity: Never  Other Topics Concern   Not on file  Social History Narrative   Married an lives with wife in [place]. Retired form [employer].    Social Drivers of Corporate investment banker Strain: Not on file  Food Insecurity: Not on file  Transportation Needs: Not on file  Physical Activity: Not on file  Stress: Not on file  Social Connections: Not on file     Family History:  The patient's family history includes Heart disease in his father and mother. There is no history of Colon cancer.  ROS:   12-point review of systems is negative unless otherwise noted in the HPI.   EKGs/Labs/Other Studies Reviewed:    Studies reviewed were summarized above. The additional studies were reviewed today:  LHC 10/14/2023:   Prox LAD lesion is 100% stenosed.   Ost Cx lesion is 30% stenosed.   Ost Cx to Prox Cx lesion is 100% stenosed.   Mid Cx lesion is 60% stenosed.   Prox RCA lesion is 100% stenosed.   Origin to Prox Graft lesion is 100% stenosed.   Origin lesion before Acute Mrg  is 20% stenosed.   2nd Mrg lesion is 80% stenosed.   Ost RPDA lesion is 70% stenosed.   Origin to Prox Graft lesion is 100% stenosed.   Mid RCA to Dist RCA lesion is 100% stenosed.   and is normal in caliber.   and is normal in caliber.   LIMA due to inability to cannulate and is normal in caliber.   SVG.   The graft exhibits minimal luminal irregularities.   The graft exhibits severe focal disease.   The graft exhibits no disease.   1.  Severe underlying three-vessel coronary artery disease with no significant change in coronary anatomy since 2021.  Patent LIMA to LAD and RIMA to diagonal.  Patent SVG to right PDA and SVG to OM 3 with known chronically occluded jump graft to OM 2 and chronically occluded SVG to diagonal. Difficult procedure overall due to heavy aortic arch calcifications  and significant innominate and subclavian artery tortuosity. 2.  The prosthetic aortic valve was not crossed.   Recommendations: Continue aggressive medical therapy.  Angina is likely due to small vessel disease. __________   2D echo 01/07/2022: 1. Left ventricular ejection fraction, by estimation, is 40 to 45%. The  left ventricle has mild to moderately decreased function. The left  ventricle demonstrates global hypokinesis. Left ventricular diastolic  parameters are consistent with Grade I  diastolic dysfunction (impaired relaxation).   2. Right ventricular systolic function is normal. The right ventricular  size is normal.   3. Left atrial size was mildly dilated.   4. Right atrial size was mildly dilated.   5. The mitral valve is degenerative. No evidence of mitral valve  regurgitation.   6. The aortic valve has been repaired/replaced. Aortic valve  regurgitation is not visualized. There is a 26 mm Sapien prosthetic (TAVR)  valve present in the aortic position. Procedure Date: 2017. Echo findings  are consistent with normal structure and  function of the aortic valve prosthesis. Aortic valve mean gradient  measures 10.0 mmHg. __________   [facility] 09/05/2020: Prox LAD lesion is 100% stenosed. 2nd Mrg lesion is 80% stenosed. Ost Cx lesion is 30% stenosed. Ost Cx to Prox Cx lesion is 100% stenosed. Mid Cx lesion is 60% stenosed. Prox RCA lesion is 100% stenosed. And is normal in caliber. The graft exhibits minimal luminal irregularities. And is  normal in caliber. The graft exhibits severe focal disease. Origin to Prox Graft lesion is 100% stenosed. LIMA due to inability to cannulate and is normal in caliber. The graft exhibits no disease. SVG. Origin lesion before Acute Mrg is 20% stenosed. Ost RPDA lesion is 70% stenosed.   1.  Significant underlying three-vessel coronary artery disease with patent grafts including LIMA to LAD, RIMA to diagonal, SVG to RCA and SVG to OM 3.   No significant change in coronary anatomy since most recent cardiac catheterization.  The patient does have native small vessel coronary artery disease which might be the culprit for his stable anginal symptoms. 2.  Right heart catheterization showed normal filling pressures, normal pulmonary pressure and normal cardiac output.   Recommendations: Continue medical therapy. __________   2D echo 09/02/2020: 1. Left ventricular ejection fraction, by estimation, is 40 to 45%. The  left ventricle has mildly decreased function. Left ventricular endocardial  border not optimally defined to evaluate regional wall motion. The left  ventricular internal cavity size  was mildly dilated. There is moderate left ventricular hypertrophy. Left  ventricular diastolic parameters are consistent with Grade I diastolic  dysfunction (impaired relaxation). Elevated left atrial pressure.   2. Right ventricular systolic function is moderately reduced. The right  ventricular size is normal. Tricuspid regurgitation signal is inadequate  for assessing PA pressure.   3. The mitral valve is normal in structure. Trivial mitral valve  regurgitation. No evidence of mitral stenosis.   4. The aortic valve has been repaired/replaced. Aortic valve  regurgitation is not visualized. Echo findings are consistent with normal  structure and function of the aortic valve prosthesis. Aortic valve mean  gradient measures 14.0 mmHg.  __________   2D echo 02/2019:  1. The left ventricle has severely reduced systolic function, with an  ejection fraction of 25-30%. The cavity size was moderately dilated. Left  ventricular diffuse hypokinesis. Unable to exclude regional wall motion  abnormality (challenging image  quality)   2. The right ventricle has normal systolic function. The cavity was  normal. There is no increase in right ventricular wall thickness. Right  ventricular systolic pressure is mildly elevated with an estimated   pressure of 38.0 mmHg.   3. Left atrial size was severely dilated.   4. Aortic valve regurgitation is mild to moderate. Mild stenosis of the  aortic valve. Mean gradient 17 mm Hg.  __________   2D echo 09/2018: - Left ventricle: The cavity size was mildly dilated. Wall    thickness was normal. Systolic function was moderately to    severely reduced. The estimated ejection fraction was in the    range of 30% to 35%. Wall motion was normal; there were no    regional wall motion abnormalities. Features are consistent with    a pseudonormal left ventricular filling pattern, with concomitant    abnormal relaxation and increased filling pressure (grade 2    diastolic dysfunction).  - Aortic valve: A bioprosthesis was present and functioning    normally. s/p TAVR. Mean gradient (S): 13 mm Hg.  - Mitral valve: Calcified annulus. There was mild regurgitation.  - Left atrium: The atrium was moderately dilated.  - Pulmonary arteries: Systolic pressure could not be accurately    estimated.  __________   LHC 05/2018: Prox LAD lesion is 100% stenosed. 2nd Mrg lesion is 80% stenosed. Prox RCA lesion is 100% stenosed. And is normal in caliber. The graft exhibits minimal luminal irregularities. And is normal in caliber. The  graft exhibits severe focal disease. LIMA due to inability to cannulate and is normal in caliber. The graft exhibits no disease. Origin lesion is 40% stenosed. Ost Cx to Prox Cx lesion is 100% stenosed. Ost Cx lesion is 30% stenosed. Mid Cx lesion is 60% stenosed. Origin to Prox Graft lesion is 100% stenosed.   1.  Significant underlying three-vessel coronary artery disease with patent LIMA to LAD, patent RIMA to diagonal, patent SVG to OM 3 and patent SVG to right PDA.  The SVG to OM 2 is now occluded which is a new finding.  However, OM2 gets retrograde flow from SVG to OM 3. 2.  Possible significant left subclavian artery stenosis although no gradient was noted with  pullback.   Recommendations: Overall difficult procedure due to significant tortuosity of the innominate artery and left subclavian artery.  No coronary revascularization is needed.  We have to investigate the significance of left subclavian artery stenosis which might be causing decreased flow into the LIMA to LAD.  Recommend carotid Doppler with focus on the left subclavian artery.  Endovascular intervention of the left subclavian artery is not straightforward due to tortuosity and also close origin to the LIMA. The patient is noted to have frequent PVCs before and throughout the cath.  This might be contributing to his cardiomyopathy.  Recommend a 24-hour Holter monitor. __________   24-hour Holter 05/2018: 5600 PVCs in 24 hours representing 6% burden. Occasional PACs. Average heart rate of 62 bpm. __________   2D echo 04/2018: - Left ventricle: The cavity size was normal. Systolic function was    moderately to severely reduced. The estimated ejection fraction    was in the range of 30% to 35%. Diffuse hypokinesis. Regional    wall motion abnormalities cannot be excluded. Features are    consistent with a pseudonormal left ventricular filling pattern,    with concomitant abnormal relaxation and increased filling    pressure (grade 2 diastolic dysfunction).  - Aortic valve: A bioprosthesis was present. s/p TAVR Peak velocity    (S): 250 cm/s. Mean gradient (S): 14 mm Hg.  - Aortic root: The aortic root was mildly dilated.  - Mitral valve: There was mild regurgitation.  - Left atrium: The atrium was moderately dilated.  - Right ventricle: Systolic function was normal.  - Pulmonary arteries: Systolic pressure was within the normal    range.  __________   2D echo 05/2017: - Left ventricle: The cavity size was mildly dilated. There was    mild concentric hypertrophy. Systolic function was mildly    reduced. The estimated ejection fraction was in the range of 45%    to 50%. Doppler  parameters are consistent with abnormal left    ventricular relaxation (grade 1 diastolic dysfunction).  - Ventricular septum: Septal motion showed abnormal function and    dyssynergy.  - Aortic valve: A TAVR prosthesis was present and functioning    normally. Mean gradient (S): 11 mm Hg. Peak gradient (S): 20 mm    Hg. Valve area (VTI): 1.79 cm^2.  - Left atrium: The atrium was moderately to severely dilated.  - Right atrium: The atrium was mildly dilated.   Impressions:   - Comapred to most recent echo, the LV is mildly more dilated with    mildly reduced EF.  __________   2D echo 06/2016: - Left ventricle: LVEF is approximately 55% with mid/distal    inferior hypokiensis; septal hypokdinesis; basal inferior    akinesis with aneurysmal dilitation. The cavity  size was mildly    dilated. Wall thickness was normal. Doppler parameters are    consistent with abnormal left ventricular relaxation (grade 1    diastolic dysfunction).  - Aortic valve: AV prosthesis is well seated Peak and mean    gradients through the valve are 19 and 11 mm Hg respectively.  - Mitral valve: Calcified annulus. Mildly thickened leaflets .  - Left atrium: The atrium was severely dilated.  __________   2D echo 05/2016: - Left ventricle: Posterior basal akinesis The cavity size was    mildly dilated. Systolic function was normal. The estimated    ejection fraction was in the range of 50% to 55%. Doppler    parameters are consistent with both elevated ventricular    end-diastolic filling pressure and elevated left atrial filling    pressure.  - Aortic valve: Normal appearing 26 mm Sapien 3 valve with no    significant perivalvular regurgitation post TAVR. Valve area    (VTI): 0.81 cm^2. Valve area (Vmax): 0.83 cm^2. Valve area    (Vmean): 0.85 cm^2.  - Left atrium: The atrium was moderately dilated.  - Atrial septum: No defect or patent foramen ovale was identified. __________   TEE 05/2016: - Aortic  valve: Valve area (VTI): 1.94 cm^2. Valve area (Vmax):    1.99 cm^2. Valve area (Vmean): 2.06 cm^2.  - Impressions: Pre-TAVR: EF 50-55% posterior basal akinesis. No    effusion. Mild MR. Normal RV with mild TR. No ASD/PFO. Aortic    valve trileaflet    and severely calcified. Peak velocity 4.3 m/sec, peak gradient 73    mmHg mean gradient 45 mmHg. Mild to moderate AR.       Post TAVR: Slightly high plaecement of 26 mm Sapien 3 valve.    Trivial perivalvular regurgitation at 2:00 on SA views. Peak    gradient 4 mmHg    mean gradient 2 mmHg peak velocity .98 m/sec AVA 2.36 cm2. EF    remains 50-55% with posteior basal akinesis No new RWMA;s Mild MR    RV normal with no increase in PA pressures No new effusion aorti    root intact.   Impressions:   - Pre-TAVR: EF 50-55% posterior basal akinesis. No effusion. Mild    MR. Normal RV with mild TR. No ASD/PFO. Aortic valve trileaflet    and severely calcified. Peak velocity 4.3 m/sec, peak gradient 73    mmHg mean gradient 45 mmHg. Mild to moderate AR. __________   [facility] 05/2016: Prox RCA lesion, 100% stenosed. SVG was injected is normal in caliber. There is mild diffuse disease in the graft. Mid Cx lesion, 100% stenosed. SVG was injected is normal in caliber. The graft exhibits minimal luminal irregularities. was injected is normal in caliber. There is severe focal disease in the graft. 2nd Mrg lesion, 80% stenosed. LIMA due to inability to cannulate and is normal in caliber, and is anatomically normal. Prox LAD lesion, 100% stenosed. Mid Graft lesion, 50% stenosed. RIMA was visualized by non-selective angiography is and is anatomically normal.   1. Right heart catheterization showed high normal pulmonary pressure and filling pressures. Normal cardiac output. 2. Severe underlying three-vessel coronary artery disease with patent grafts. 3. Moderate left subclavian stenosis not significant by gradients.   Recommendations: No  revascularization is needed before TAVR.   EKG:  EKG is not ordered today.    Recent Labs: No results found for requested labs within last 365 days.  Recent Lipid Panel    Component  Value Date/Time   CHOL 144 01/07/2022 0754   TRIG 120 01/07/2022 0754   HDL 56 01/07/2022 0754   CHOLHDL 2.6 01/07/2022 0754   CHOLHDL 3.0 12/18/2020 1635   VLDL 23 12/18/2020 1635   LDLCALC 67 01/07/2022 0754   LDLDIRECT 83.3 12/18/2020 1635    PHYSICAL EXAM:    VS:  BP 110/62 (BP Location: Left Arm, Patient Position: Sitting, Cuff Size: Normal)   Pulse 71   Ht 5\' 9"  (1.753 m)   Wt 196 lb 6.4 oz (89.1 kg)   SpO2 97%   BMI 29.00 kg/m   BMI: Body mass index is 29 kg/m.  Physical Exam Vitals reviewed.  Constitutional:      Appearance: He is well-developed.  HENT:     Head: Normocephalic and atraumatic.  Eyes:     General:        Right eye: No discharge.        Left eye: No discharge.  Neck:     Vascular: No JVD.  Cardiovascular:     Rate and Rhythm: Normal rate and regular rhythm.     Pulses:          Posterior tibial pulses are 2+ on the right side and 2+ on the left side.     Heart sounds: S1 normal and S2 normal. Heart sounds not distant. No midsystolic click and no opening snap. Murmur heard.     Systolic murmur is present with a grade of 2/6 at the upper right sternal border.     No friction rub.  Pulmonary:     Effort: Pulmonary effort is normal. No respiratory distress.     Breath sounds: Normal breath sounds. No decreased breath sounds, wheezing, rhonchi or rales.  Chest:     Chest wall: No tenderness.  Abdominal:     General: There is no distension.  Musculoskeletal:     Cervical back: Normal range of motion.     Right lower leg: No edema.     Left lower leg: No edema.  Skin:    General: Skin is warm and dry.     Nails: There is no clubbing.  Neurological:     Mental Status: He is alert and oriented to person, place, and time.  Psychiatric:        Speech: Speech  normal.        Behavior: Behavior normal.        Thought Content: Thought content normal.        Judgment: Judgment normal.     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  LHC showed stable coronary anatomy with known multivessel native CAD and patent LIMA to LAD and RIMA to diagonal along with patent SVG to PDA and SVG to OM 3 with known chronically occluded graft to OM 2 and chronically occluded SVG to diagonal. Suspect anginal symptoms are related to small vessel disease.  Continue aggressive risk factor modification and secondary prevention including aspirin 81 mg, clopidogrel 75 mg, atorvastatin 80 mg, ezetimibe 10 mg, clopidogrel 3.125 milligram twice daily, and Imdur 120 mg daily.  Intolerant to ranolazine secondary to rash.  No indication for further ischemic testing at this time.  HFrEF secondary to ICM with LBBB status post  CRT-P: Appears euvolemic and well compensated.  Relative hypotension has previously led to de-escalation of GDMT.  Remains on Coreg 3.125 mg twice daily, Entresto 24/26 mg twice daily, and spironolactone 12.5 mg daily.  Not requiring a standing loop diuretic.  Severe aortic stenosis status post TAVR: Most recent echo showed normal functioning prosthesis with a mean gradient of centimeter meter mercury.  Remains on aspirin.  SBE prophylaxis is indicated for dental procedures.  HTN: Blood pressure is well-controlled in the office today.  Continue carvedilol, Entresto, and spironolactone as outlined above.  HLD: LDL 69 in 09/2023 with normal AST/ALT at that time.  Remains on atorvastatin 80 mg and ezetimibe 10 mg.  PAD: No symptoms of claudication following bilateral iliac stenting.  Remains on aspirin and statin as above.   Disposition: F/u with Dr. Kirke Corin or  an APP in 6 months.   Medication Adjustments/Labs and Tests Ordered: Current medicines are reviewed at length with the patient today.  Concerns regarding medicines are outlined above. Medication changes, Labs and Tests ordered today are summarized above and listed in the Patient Instructions accessible in Encounters.   Elinor Dodge, PA-C 01/24/2024 12:08 PM     [facility] [place] [address] [place], [place] [address] [phone]

## 2024-01-27 LAB — CUP PACEART REMOTE DEVICE CHECK
Battery Remaining Longevity: 23 mo
Battery Remaining Percentage: 32 %
Battery Voltage: 2.96 V
Brady Statistic AP VP Percent: 78 %
Brady Statistic AP VS Percent: 1 %
Brady Statistic AS VP Percent: 17 %
Brady Statistic AS VS Percent: 1 %
Brady Statistic RA Percent Paced: 75 %
Date Time Interrogation Session: 20250211024526
Implantable Lead Connection Status: 753985
Implantable Lead Connection Status: 753985
Implantable Lead Connection Status: 753985
Implantable Lead Implant Date: 20200810
Implantable Lead Implant Date: 20200810
Implantable Lead Implant Date: 20200810
Implantable Lead Location: 753858
Implantable Lead Location: 753859
Implantable Lead Location: 753860
Implantable Lead Model: 5076
Implantable Lead Model: 5076
Implantable Pulse Generator Implant Date: 20200810
Lead Channel Impedance Value: 390 Ohm
Lead Channel Impedance Value: 410 Ohm
Lead Channel Impedance Value: 540 Ohm
Lead Channel Pacing Threshold Amplitude: 0.75 V
Lead Channel Pacing Threshold Amplitude: 1 V
Lead Channel Pacing Threshold Amplitude: 1 V
Lead Channel Pacing Threshold Pulse Width: 0.4 ms
Lead Channel Pacing Threshold Pulse Width: 0.4 ms
Lead Channel Pacing Threshold Pulse Width: 1 ms
Lead Channel Sensing Intrinsic Amplitude: 12 mV
Lead Channel Sensing Intrinsic Amplitude: 2.1 mV
Lead Channel Setting Pacing Amplitude: 2 V
Lead Channel Setting Pacing Amplitude: 2 V
Lead Channel Setting Pacing Amplitude: 2 V
Lead Channel Setting Pacing Pulse Width: 0.4 ms
Lead Channel Setting Pacing Pulse Width: 1 ms
Lead Channel Setting Sensing Sensitivity: 2 mV
Pulse Gen Model: 3562
Pulse Gen Serial Number: 9130392

## 2024-01-30 ENCOUNTER — Ambulatory Visit: Payer: Medicare HMO | Admitting: Dermatology

## 2024-02-01 ENCOUNTER — Other Ambulatory Visit: Payer: Self-pay | Admitting: Physician Assistant

## 2024-02-13 ENCOUNTER — Encounter: Payer: Self-pay | Admitting: Internal Medicine

## 2024-03-05 NOTE — Addendum Note (Signed)
 Addended by: Geralyn Flash D on: 03/05/2024 01:56 PM   Modules accepted: Orders

## 2024-03-05 NOTE — Progress Notes (Signed)
 Remote pacemaker transmission.

## 2024-03-25 ENCOUNTER — Encounter: Payer: Self-pay | Admitting: Oncology

## 2024-04-24 ENCOUNTER — Encounter: Payer: Self-pay | Admitting: Oncology

## 2024-04-24 ENCOUNTER — Ambulatory Visit (INDEPENDENT_AMBULATORY_CARE_PROVIDER_SITE_OTHER): Payer: Medicare HMO

## 2024-04-24 DIAGNOSIS — I255 Ischemic cardiomyopathy: Secondary | ICD-10-CM

## 2024-04-26 LAB — CUP PACEART REMOTE DEVICE CHECK
Battery Remaining Longevity: 20 mo
Battery Remaining Percentage: 28 %
Battery Voltage: 2.95 V
Brady Statistic AP VP Percent: 77 %
Brady Statistic AP VS Percent: 1 %
Brady Statistic AS VP Percent: 18 %
Brady Statistic AS VS Percent: 1 %
Brady Statistic RA Percent Paced: 74 %
Date Time Interrogation Session: 20250513023043
Implantable Lead Connection Status: 753985
Implantable Lead Connection Status: 753985
Implantable Lead Connection Status: 753985
Implantable Lead Implant Date: 20200810
Implantable Lead Implant Date: 20200810
Implantable Lead Implant Date: 20200810
Implantable Lead Location: 753858
Implantable Lead Location: 753859
Implantable Lead Location: 753860
Implantable Lead Model: 5076
Implantable Lead Model: 5076
Implantable Pulse Generator Implant Date: 20200810
Lead Channel Impedance Value: 410 Ohm
Lead Channel Impedance Value: 410 Ohm
Lead Channel Impedance Value: 540 Ohm
Lead Channel Pacing Threshold Amplitude: 0.75 V
Lead Channel Pacing Threshold Amplitude: 1 V
Lead Channel Pacing Threshold Amplitude: 1 V
Lead Channel Pacing Threshold Pulse Width: 0.4 ms
Lead Channel Pacing Threshold Pulse Width: 0.4 ms
Lead Channel Pacing Threshold Pulse Width: 1 ms
Lead Channel Sensing Intrinsic Amplitude: 1.5 mV
Lead Channel Sensing Intrinsic Amplitude: 12 mV
Lead Channel Setting Pacing Amplitude: 2 V
Lead Channel Setting Pacing Amplitude: 2 V
Lead Channel Setting Pacing Amplitude: 2 V
Lead Channel Setting Pacing Pulse Width: 0.4 ms
Lead Channel Setting Pacing Pulse Width: 1 ms
Lead Channel Setting Sensing Sensitivity: 2 mV
Pulse Gen Model: 3562
Pulse Gen Serial Number: 9130392

## 2024-05-02 ENCOUNTER — Ambulatory Visit: Payer: Self-pay | Admitting: Cardiology

## 2024-05-13 DEATH — deceased

## 2024-06-08 NOTE — Addendum Note (Signed)
 Addended by: VICCI SELLER A on: 06/08/2024 11:07 AM   Modules accepted: Orders, Level of Service

## 2024-06-08 NOTE — Progress Notes (Signed)
 Remote pacemaker transmission.
